# Patient Record
Sex: Female | Born: 1960
Health system: Southern US, Community
[De-identification: ages and names within clinical notes are randomized; demographics above are authoritative.]

## PROBLEM LIST (undated history)

## (undated) DIAGNOSIS — H409 Unspecified glaucoma: Secondary | ICD-10-CM

## (undated) DIAGNOSIS — K3189 Other diseases of stomach and duodenum: Secondary | ICD-10-CM

## (undated) DIAGNOSIS — F32A Depression, unspecified: Secondary | ICD-10-CM

## (undated) DIAGNOSIS — G620 Drug-induced polyneuropathy: Secondary | ICD-10-CM

## (undated) DIAGNOSIS — Z860100 Personal history of colon polyps, unspecified: Secondary | ICD-10-CM

## (undated) DIAGNOSIS — J449 Chronic obstructive pulmonary disease, unspecified: Secondary | ICD-10-CM

## (undated) DIAGNOSIS — R87629 Unspecified abnormal cytological findings in specimens from vagina: Secondary | ICD-10-CM

## (undated) DIAGNOSIS — K219 Gastro-esophageal reflux disease without esophagitis: Secondary | ICD-10-CM

## (undated) DIAGNOSIS — N898 Other specified noninflammatory disorders of vagina: Secondary | ICD-10-CM

## (undated) DIAGNOSIS — G629 Polyneuropathy, unspecified: Secondary | ICD-10-CM

## (undated) DIAGNOSIS — K921 Melena: Secondary | ICD-10-CM

## (undated) DIAGNOSIS — K5730 Diverticulosis of large intestine without perforation or abscess without bleeding: Secondary | ICD-10-CM

## (undated) DIAGNOSIS — Z1509 Genetic susceptibility to other malignant neoplasm: Secondary | ICD-10-CM

## (undated) DIAGNOSIS — R1012 Left upper quadrant pain: Secondary | ICD-10-CM

## (undated) DIAGNOSIS — Z8051 Family history of malignant neoplasm of kidney: Secondary | ICD-10-CM

## (undated) DIAGNOSIS — F329 Major depressive disorder, single episode, unspecified: Secondary | ICD-10-CM

## (undated) DIAGNOSIS — D259 Leiomyoma of uterus, unspecified: Secondary | ICD-10-CM

## (undated) DIAGNOSIS — M1712 Unilateral primary osteoarthritis, left knee: Secondary | ICD-10-CM

## (undated) DIAGNOSIS — K5792 Diverticulitis of intestine, part unspecified, without perforation or abscess without bleeding: Secondary | ICD-10-CM

## (undated) DIAGNOSIS — D649 Anemia, unspecified: Secondary | ICD-10-CM

## (undated) DIAGNOSIS — T451X5A Adverse effect of antineoplastic and immunosuppressive drugs, initial encounter: Secondary | ICD-10-CM

## (undated) DIAGNOSIS — K31A0 Gastric intestinal metaplasia, unspecified: Secondary | ICD-10-CM

## (undated) DIAGNOSIS — K625 Hemorrhage of anus and rectum: Secondary | ICD-10-CM

## (undated) DIAGNOSIS — K2100 Gastro-esophageal reflux disease with esophagitis, without bleeding: Secondary | ICD-10-CM

## (undated) DIAGNOSIS — D490 Neoplasm of unspecified behavior of digestive system: Secondary | ICD-10-CM

## (undated) DIAGNOSIS — R4702 Dysphasia: Secondary | ICD-10-CM

## (undated) DIAGNOSIS — K5900 Constipation, unspecified: Secondary | ICD-10-CM

## (undated) DIAGNOSIS — C541 Malignant neoplasm of endometrium: Secondary | ICD-10-CM

## (undated) DIAGNOSIS — G8929 Other chronic pain: Secondary | ICD-10-CM

## (undated) DIAGNOSIS — R933 Abnormal findings on diagnostic imaging of other parts of digestive tract: Secondary | ICD-10-CM

## (undated) DIAGNOSIS — R1013 Epigastric pain: Secondary | ICD-10-CM

## (undated) DIAGNOSIS — R4701 Aphasia: Secondary | ICD-10-CM

## (undated) DIAGNOSIS — F419 Anxiety disorder, unspecified: Secondary | ICD-10-CM

## (undated) DIAGNOSIS — E785 Hyperlipidemia, unspecified: Secondary | ICD-10-CM

## (undated) DIAGNOSIS — C189 Malignant neoplasm of colon, unspecified: Secondary | ICD-10-CM

## (undated) DIAGNOSIS — M67462 Ganglion, left knee: Secondary | ICD-10-CM

## (undated) DIAGNOSIS — M25469 Effusion, unspecified knee: Secondary | ICD-10-CM

## (undated) DIAGNOSIS — Z9889 Other specified postprocedural states: Secondary | ICD-10-CM

## (undated) DIAGNOSIS — R7303 Prediabetes: Secondary | ICD-10-CM

## (undated) DIAGNOSIS — K30 Functional dyspepsia: Secondary | ICD-10-CM

## (undated) DIAGNOSIS — F449 Dissociative and conversion disorder, unspecified: Secondary | ICD-10-CM

## (undated) DIAGNOSIS — Z809 Family history of malignant neoplasm, unspecified: Secondary | ICD-10-CM

## (undated) DIAGNOSIS — C801 Malignant (primary) neoplasm, unspecified: Secondary | ICD-10-CM

## (undated) DIAGNOSIS — R1319 Other dysphagia: Secondary | ICD-10-CM

## (undated) DIAGNOSIS — IMO0002 Reserved for concepts with insufficient information to code with codable children: Secondary | ICD-10-CM

## (undated) DIAGNOSIS — R112 Nausea with vomiting, unspecified: Secondary | ICD-10-CM

## (undated) HISTORY — PX: ABDOMINAL HYSTERECTOMY: SHX81

## (undated) HISTORY — DX: Family history of malignant neoplasm of kidney: Z80.51

## (undated) HISTORY — PX: APPENDECTOMY: SHX54

## (undated) HISTORY — DX: Functional dyspepsia: K30

## (undated) HISTORY — DX: Chronic obstructive pulmonary disease, unspecified: J44.9

## (undated) HISTORY — DX: Other specified noninflammatory disorders of vagina: N89.8

## (undated) HISTORY — DX: Anemia, unspecified: D64.9

## (undated) HISTORY — DX: Genetic susceptibility to other malignant neoplasm: Z15.09

## (undated) HISTORY — DX: Reserved for concepts with insufficient information to code with codable children: IMO0002

## (undated) HISTORY — DX: Unspecified abnormal cytological findings in specimens from vagina: R87.629

## (undated) HISTORY — DX: Family history of malignant neoplasm, unspecified: Z80.9

## (undated) HISTORY — PX: TUBAL LIGATION: SHX77

## (undated) HISTORY — DX: Malignant neoplasm of colon, unspecified: C18.9

## (undated) HISTORY — DX: Diverticulitis of intestine, part unspecified, without perforation or abscess without bleeding: K57.92

---

## 2004-01-23 ENCOUNTER — Emergency Department (HOSPITAL_COMMUNITY): Admission: EM | Admit: 2004-01-23 | Discharge: 2004-01-23 | Payer: Self-pay | Admitting: Emergency Medicine

## 2004-05-07 ENCOUNTER — Emergency Department (HOSPITAL_COMMUNITY): Admission: EM | Admit: 2004-05-07 | Discharge: 2004-05-07 | Payer: Self-pay | Admitting: Emergency Medicine

## 2004-10-30 ENCOUNTER — Emergency Department (HOSPITAL_COMMUNITY): Admission: EM | Admit: 2004-10-30 | Discharge: 2004-10-30 | Payer: Self-pay | Admitting: Emergency Medicine

## 2004-11-24 ENCOUNTER — Ambulatory Visit (HOSPITAL_COMMUNITY): Admission: RE | Admit: 2004-11-24 | Discharge: 2004-11-24 | Payer: Self-pay | Admitting: Preventative Medicine

## 2005-02-23 ENCOUNTER — Emergency Department (HOSPITAL_COMMUNITY): Admission: EM | Admit: 2005-02-23 | Discharge: 2005-02-23 | Payer: Self-pay | Admitting: Emergency Medicine

## 2005-06-16 ENCOUNTER — Emergency Department (HOSPITAL_COMMUNITY): Admission: EM | Admit: 2005-06-16 | Discharge: 2005-06-16 | Payer: Self-pay | Admitting: Emergency Medicine

## 2006-05-23 ENCOUNTER — Emergency Department (HOSPITAL_COMMUNITY): Admission: EM | Admit: 2006-05-23 | Discharge: 2006-05-23 | Payer: Self-pay | Admitting: Emergency Medicine

## 2007-08-31 ENCOUNTER — Emergency Department (HOSPITAL_COMMUNITY): Admission: EM | Admit: 2007-08-31 | Discharge: 2007-08-31 | Payer: Self-pay | Admitting: Emergency Medicine

## 2007-12-23 ENCOUNTER — Other Ambulatory Visit: Admission: RE | Admit: 2007-12-23 | Discharge: 2007-12-23 | Payer: Self-pay | Admitting: Obstetrics and Gynecology

## 2008-04-10 ENCOUNTER — Emergency Department (HOSPITAL_COMMUNITY): Admission: EM | Admit: 2008-04-10 | Discharge: 2008-04-10 | Payer: Self-pay | Admitting: Emergency Medicine

## 2008-12-04 ENCOUNTER — Emergency Department (HOSPITAL_COMMUNITY): Admission: EM | Admit: 2008-12-04 | Discharge: 2008-12-04 | Payer: Self-pay | Admitting: Emergency Medicine

## 2010-05-06 ENCOUNTER — Emergency Department (HOSPITAL_BASED_OUTPATIENT_CLINIC_OR_DEPARTMENT_OTHER): Admission: EM | Admit: 2010-05-06 | Discharge: 2010-05-06 | Payer: Self-pay | Admitting: Emergency Medicine

## 2010-05-06 ENCOUNTER — Ambulatory Visit: Payer: Self-pay | Admitting: Diagnostic Radiology

## 2011-03-20 ENCOUNTER — Emergency Department (HOSPITAL_BASED_OUTPATIENT_CLINIC_OR_DEPARTMENT_OTHER)
Admission: EM | Admit: 2011-03-20 | Discharge: 2011-03-20 | Disposition: A | Discharge status: Home / Self Care | Payer: Self-pay | Attending: Emergency Medicine | Admitting: Emergency Medicine

## 2011-03-20 DIAGNOSIS — J329 Chronic sinusitis, unspecified: Secondary | ICD-10-CM | POA: Insufficient documentation

## 2011-03-20 DIAGNOSIS — J45909 Unspecified asthma, uncomplicated: Secondary | ICD-10-CM | POA: Insufficient documentation

## 2011-03-20 DIAGNOSIS — J029 Acute pharyngitis, unspecified: Secondary | ICD-10-CM | POA: Insufficient documentation

## 2011-06-19 LAB — URINE MICROSCOPIC-ADD ON

## 2011-06-19 LAB — URINALYSIS, ROUTINE W REFLEX MICROSCOPIC
Glucose, UA: NEGATIVE
Ketones, ur: NEGATIVE
Nitrite: NEGATIVE
Specific Gravity, Urine: 1.007
pH: 6

## 2011-06-29 LAB — URINALYSIS, ROUTINE W REFLEX MICROSCOPIC
Glucose, UA: NEGATIVE
Ketones, ur: NEGATIVE
Leukocytes, UA: NEGATIVE
Protein, ur: NEGATIVE
pH: 7

## 2011-06-29 LAB — DIFFERENTIAL
Basophils Absolute: 0
Eosinophils Relative: 1
Lymphocytes Relative: 32
Neutro Abs: 5.1
Neutrophils Relative %: 58

## 2011-06-29 LAB — POCT I-STAT CREATININE
Creatinine, Ser: 0.8
Creatinine, Ser: 0.8
Operator id: 207241
Operator id: 217071

## 2011-06-29 LAB — I-STAT 8, (EC8 V) (CONVERTED LAB)
BUN: 9
Chloride: 107
Glucose, Bld: 91
HCT: 39
Hemoglobin: 13.3
Operator id: 207241
Sodium: 139

## 2011-06-29 LAB — CBC
Platelets: 274
RDW: 13.1
WBC: 8.8

## 2011-06-29 LAB — URINE MICROSCOPIC-ADD ON

## 2011-06-29 LAB — WET PREP, GENITAL: Yeast Wet Prep HPF POC: NONE SEEN

## 2011-08-25 ENCOUNTER — Emergency Department (HOSPITAL_BASED_OUTPATIENT_CLINIC_OR_DEPARTMENT_OTHER): Payer: Self-pay

## 2011-08-25 ENCOUNTER — Emergency Department (INDEPENDENT_AMBULATORY_CARE_PROVIDER_SITE_OTHER): Payer: Self-pay

## 2011-08-25 ENCOUNTER — Emergency Department (HOSPITAL_BASED_OUTPATIENT_CLINIC_OR_DEPARTMENT_OTHER)
Admission: EM | Admit: 2011-08-25 | Discharge: 2011-08-25 | Disposition: A | Discharge status: Home / Self Care | Payer: Self-pay | Attending: Emergency Medicine | Admitting: Emergency Medicine

## 2011-08-25 ENCOUNTER — Encounter: Payer: Self-pay | Admitting: *Deleted

## 2011-08-25 DIAGNOSIS — B349 Viral infection, unspecified: Secondary | ICD-10-CM

## 2011-08-25 DIAGNOSIS — IMO0001 Reserved for inherently not codable concepts without codable children: Secondary | ICD-10-CM | POA: Insufficient documentation

## 2011-08-25 DIAGNOSIS — R52 Pain, unspecified: Secondary | ICD-10-CM

## 2011-08-25 DIAGNOSIS — R0989 Other specified symptoms and signs involving the circulatory and respiratory systems: Secondary | ICD-10-CM

## 2011-08-25 DIAGNOSIS — R059 Cough, unspecified: Secondary | ICD-10-CM | POA: Insufficient documentation

## 2011-08-25 DIAGNOSIS — B9789 Other viral agents as the cause of diseases classified elsewhere: Secondary | ICD-10-CM | POA: Insufficient documentation

## 2011-08-25 DIAGNOSIS — F17200 Nicotine dependence, unspecified, uncomplicated: Secondary | ICD-10-CM

## 2011-08-25 DIAGNOSIS — R05 Cough: Secondary | ICD-10-CM

## 2011-08-25 MED ORDER — IBUPROFEN 800 MG PO TABS: 800.0000 mg | ORAL_TABLET | Freq: Three times a day (TID) | ORAL | Status: AC

## 2011-08-25 MED ORDER — HYDROCOD POLST-CHLORPHEN POLST 10-8 MG/5ML PO LQCR: 5.0000 mL | Freq: Two times a day (BID) | ORAL | Status: DC

## 2011-08-25 NOTE — ED Provider Notes (Signed)
Medical screening examination/treatment/procedure(s) were performed by non-physician practitioner and as supervising physician I was immediately available for consultation/collaboration.   Lyanne Co, MD 08/25/11 2126

## 2011-08-25 NOTE — ED Provider Notes (Signed)
History     CSN: [id number] Arrival date & time: 08/25/2011  1:09 PM   First MD Initiated Contact with Patient 08/25/11 1323      Chief Complaint  Patient presents with  . Cough  . Generalized Body Aches    (Consider location/radiation/quality/duration/timing/severity/associated sxs/prior treatment) Patient is a 50 y.o. female presenting with cough. The history is provided by the patient. No language interpreter was used.  Cough This is a new problem. The current episode started more than 2 days ago. The problem occurs constantly. The problem has been gradually worsening. The cough is non-productive. The maximum temperature recorded prior to her arrival was 101 to 101.9 F. Associated symptoms include myalgias and shortness of breath. Pertinent negatives include no chest pain. She has tried cough syrup for the symptoms. The treatment provided no relief. Risk factors: smoker. She is a smoker. Her past medical history does not include pneumonia.    Past Medical History  Diagnosis Date  . Asthma     Past Surgical History  Procedure Date  . Tubal ligation   . Appendectomy     History reviewed. No pertinent family history.  History  Substance Use Topics  . Smoking status: Current Everyday Smoker  . Smokeless tobacco: Not on file  . Alcohol Use:     OB History    Grav Para Term Preterm Abortions TAB SAB Ect Mult Living                  Review of Systems  Respiratory: Positive for cough and shortness of breath.   Cardiovascular: Negative for chest pain.  Musculoskeletal: Positive for myalgias.  All other systems reviewed and are negative.    Allergies  Codeine  Home Medications  No current outpatient prescriptions on file.  BP 137/72  Pulse 74  Temp 97.9 F (36.6 C)  SpO2 97%  Physical Exam  Nursing note and vitals reviewed. Constitutional: She is oriented to person, place, and time. She appears well-developed and well-nourished.  HENT:  Head:  Normocephalic and atraumatic.  Right Ear: External ear normal.  Left Ear: External ear normal.  Nose: Nose normal.  Mouth/Throat: Oropharynx is clear and moist.  Eyes: Conjunctivae and EOM are normal. Pupils are equal, round, and reactive to light.  Neck: Normal range of motion. Neck supple.  Cardiovascular: Normal rate and normal heart sounds.   Pulmonary/Chest: Effort normal and breath sounds normal.  Abdominal: Soft.  Musculoskeletal: Normal range of motion.  Neurological: She is alert and oriented to person, place, and time. She has normal reflexes.  Skin: Skin is warm.  Psychiatric: She has a normal mood and affect.    ED Course  Procedures (including critical care time)  Labs Reviewed - No data to display Dg Chest 2 View  08/25/2011  *RADIOLOGY REPORT*  Clinical Data: Cough, congestion, body aches, smoker  CHEST - 2 VIEW  Comparison: December 04, 2008  Findings: The cardiac silhouette, mediastinum, pulmonary vasculature are within normal limits.  Both lungs are clear. There is no acute bony abnormality.  IMPRESSION: There is no evidence of acute cardiac or pulmonary process.  Original Report Authenticated By: Brandon Melnick, M.D.     No diagnosis found.    MDM  Chest xray no pneumonia.  Pt given rx for tussionex for cough,  Ibuprofen for bodyaches        Langston Masker, [place] 08/25/11 1421

## 2011-08-25 NOTE — ED Notes (Signed)
Coughing congestion noted

## 2011-08-25 NOTE — ED Notes (Signed)
Pt c/o cough, body aches and chest congestion x 3 days

## 2012-08-09 ENCOUNTER — Encounter (HOSPITAL_BASED_OUTPATIENT_CLINIC_OR_DEPARTMENT_OTHER): Payer: Self-pay | Admitting: Emergency Medicine

## 2012-08-09 ENCOUNTER — Emergency Department (HOSPITAL_BASED_OUTPATIENT_CLINIC_OR_DEPARTMENT_OTHER)
Admission: EM | Admit: 2012-08-09 | Discharge: 2012-08-09 | Disposition: A | Discharge status: Home / Self Care | Payer: Self-pay | Attending: Emergency Medicine | Admitting: Emergency Medicine

## 2012-08-09 ENCOUNTER — Emergency Department (HOSPITAL_BASED_OUTPATIENT_CLINIC_OR_DEPARTMENT_OTHER): Payer: Self-pay

## 2012-08-09 DIAGNOSIS — R05 Cough: Secondary | ICD-10-CM | POA: Insufficient documentation

## 2012-08-09 DIAGNOSIS — R0602 Shortness of breath: Secondary | ICD-10-CM | POA: Insufficient documentation

## 2012-08-09 DIAGNOSIS — J329 Chronic sinusitis, unspecified: Secondary | ICD-10-CM

## 2012-08-09 DIAGNOSIS — F17200 Nicotine dependence, unspecified, uncomplicated: Secondary | ICD-10-CM | POA: Insufficient documentation

## 2012-08-09 DIAGNOSIS — Z79899 Other long term (current) drug therapy: Secondary | ICD-10-CM | POA: Insufficient documentation

## 2012-08-09 DIAGNOSIS — R6883 Chills (without fever): Secondary | ICD-10-CM | POA: Insufficient documentation

## 2012-08-09 DIAGNOSIS — J069 Acute upper respiratory infection, unspecified: Secondary | ICD-10-CM | POA: Insufficient documentation

## 2012-08-09 DIAGNOSIS — J019 Acute sinusitis, unspecified: Secondary | ICD-10-CM | POA: Insufficient documentation

## 2012-08-09 DIAGNOSIS — J3489 Other specified disorders of nose and nasal sinuses: Secondary | ICD-10-CM | POA: Insufficient documentation

## 2012-08-09 DIAGNOSIS — R059 Cough, unspecified: Secondary | ICD-10-CM | POA: Insufficient documentation

## 2012-08-09 DIAGNOSIS — J45909 Unspecified asthma, uncomplicated: Secondary | ICD-10-CM | POA: Insufficient documentation

## 2012-08-09 DIAGNOSIS — IMO0001 Reserved for inherently not codable concepts without codable children: Secondary | ICD-10-CM | POA: Insufficient documentation

## 2012-08-09 MED ORDER — ALBUTEROL SULFATE HFA 108 (90 BASE) MCG/ACT IN AERS
2.0000 | INHALATION_SPRAY | RESPIRATORY_TRACT | Status: DC | PRN
  Administered 2012-08-09: 2 via RESPIRATORY_TRACT
  Filled 2012-08-09: qty 6.7

## 2012-08-09 MED ORDER — AZITHROMYCIN 250 MG PO TABS: 250.0000 mg | ORAL_TABLET | Freq: Every day | ORAL | Status: DC

## 2012-08-09 MED ORDER — AZITHROMYCIN 250 MG PO TABS
500.0000 mg | ORAL_TABLET | Freq: Once | ORAL | Status: AC
  Administered 2012-08-09: 500 mg via ORAL
  Filled 2012-08-09: qty 2

## 2012-08-09 NOTE — ED Notes (Signed)
MD at bedside. 

## 2012-08-09 NOTE — ED Notes (Signed)
Pt c/o "sinus infection" that has now moved to lower respiratory. Pt reports new onset on n/v, chills, generalized body aches and SHOB.

## 2012-08-09 NOTE — ED Provider Notes (Signed)
History     CSN: [id number]  Arrival date & time 08/09/12  2956   First MD Initiated Contact with Patient 08/09/12 [phone]      Chief Complaint  Patient presents with  . Emesis  . Generalized Body Aches  . Cough    (Consider location/radiation/quality/duration/timing/severity/associated sxs/prior treatment) Patient is a 51 y.o. female presenting with vomiting and cough. The history is provided by the patient.  Emesis  This is a new problem. The current episode started less than 1 hour ago. Episode frequency: once. The problem has been resolved. The emesis has an appearance of stomach contents. There has been no fever (chills). Associated symptoms include chills, cough, myalgias and URI. Pertinent negatives include no abdominal pain, no diarrhea and no fever. Risk factors include ill contacts.  Cough This is a new problem. The current episode started more than 1 week ago. The problem occurs every few minutes. The problem has been gradually worsening. The cough is productive of sputum. There has been no fever. Associated symptoms include chills, rhinorrhea, myalgias, shortness of breath and wheezing. Pertinent negatives include no sore throat. Associated symptoms comments: Worsening sinus congestion and facial pain. She has tried decongestants for the symptoms. The treatment provided no relief. She is a smoker. Her past medical history is significant for asthma. Her past medical history does not include COPD.    Past Medical History  Diagnosis Date  . Asthma     Past Surgical History  Procedure Date  . Tubal ligation   . Appendectomy     No family history on file.  History  Substance Use Topics  . Smoking status: Current Every Day Smoker -- 0.5 packs/day    Types: Cigarettes  . Smokeless tobacco: Not on file  . Alcohol Use: No    OB History    Grav Para Term Preterm Abortions TAB SAB Ect Mult Living                  Review of Systems  Constitutional: Positive for  chills. Negative for fever.  HENT: Positive for congestion, rhinorrhea and sinus pressure. Negative for sore throat.   Respiratory: Positive for cough, shortness of breath and wheezing.   Gastrointestinal: Positive for vomiting. Negative for abdominal pain and diarrhea.  Musculoskeletal: Positive for myalgias.  All other systems reviewed and are negative.    Allergies  Codeine  Home Medications   Current Outpatient Rx  Name  Route  Sig  Dispense  Refill  . HYDROCOD POLST-CPM POLST ER 10-8 MG/5ML PO LQCR   Oral   Take 5 mLs by mouth every 12 (twelve) hours.   80 mL   0     BP 123/77  Pulse 79  Temp 98.1 F (36.7 C) (Oral)  Resp 16  Ht 5\' 3"  (1.6 m)  Wt 160 lb (86.578 kg)  BMI 28.34 kg/m2  SpO2 100%  Physical Exam  Nursing note and vitals reviewed. Constitutional: She is oriented to person, place, and time. She appears well-developed and well-nourished. No distress.  HENT:  Head: Normocephalic and atraumatic.  Right Ear: Tympanic membrane and ear canal normal.  Left Ear: Tympanic membrane and ear canal normal.  Nose: Mucosal edema present. Right sinus exhibits maxillary sinus tenderness. Left sinus exhibits maxillary sinus tenderness.  Mouth/Throat: Oropharynx is clear and moist and mucous membranes are normal.  Eyes: Conjunctivae normal and EOM are normal. Pupils are equal, round, and reactive to light.  Neck: Normal range of motion. Neck supple.  Cardiovascular: Normal  rate, regular rhythm and intact distal pulses.   No murmur heard. Pulmonary/Chest: Effort normal and breath sounds normal. No respiratory distress. She has no wheezes. She has no rales.  Abdominal: Soft. She exhibits no distension. There is no tenderness. There is no rebound and no guarding.  Musculoskeletal: Normal range of motion. She exhibits no edema and no tenderness.  Neurological: She is alert and oriented to person, place, and time.  Skin: Skin is warm and dry. No rash noted. No erythema.    Psychiatric: She has a normal mood and affect. Her behavior is normal.    ED Course  Procedures (including critical care time)  Labs Reviewed - No data to display No results found.   No diagnosis found.    MDM   Pt with symptoms consistent with sinusitis which as been ongoing for >2 weeks and now c/o of symptoms suggestive of bronchitis.  Well appearing here.  No signs of breathing difficulty and no wheezing.  However pt is a smoker and inhaler ran out 3 days ago.  No signs of pharyngitis, otitis or abnormal abdominal findings.   CXR wnl and pt to return with any further problems. Pt given inhaler here and due to 2 weeks of sinus sx will treat with abx.         Gwyneth Sprout, MD 08/09/12 1444

## 2012-08-09 NOTE — ED Notes (Signed)
Chart reviewed.

## 2012-11-19 ENCOUNTER — Emergency Department (HOSPITAL_BASED_OUTPATIENT_CLINIC_OR_DEPARTMENT_OTHER)
Admission: EM | Admit: 2012-11-19 | Discharge: 2012-11-19 | Disposition: A | Discharge status: Home / Self Care | Payer: Self-pay | Attending: Emergency Medicine | Admitting: Emergency Medicine

## 2012-11-19 ENCOUNTER — Encounter (HOSPITAL_BASED_OUTPATIENT_CLINIC_OR_DEPARTMENT_OTHER): Payer: Self-pay | Admitting: Emergency Medicine

## 2012-11-19 DIAGNOSIS — Z79899 Other long term (current) drug therapy: Secondary | ICD-10-CM | POA: Insufficient documentation

## 2012-11-19 DIAGNOSIS — F17200 Nicotine dependence, unspecified, uncomplicated: Secondary | ICD-10-CM | POA: Insufficient documentation

## 2012-11-19 DIAGNOSIS — R05 Cough: Secondary | ICD-10-CM | POA: Insufficient documentation

## 2012-11-19 DIAGNOSIS — J329 Chronic sinusitis, unspecified: Secondary | ICD-10-CM | POA: Insufficient documentation

## 2012-11-19 DIAGNOSIS — J441 Chronic obstructive pulmonary disease with (acute) exacerbation: Secondary | ICD-10-CM | POA: Insufficient documentation

## 2012-11-19 DIAGNOSIS — J3489 Other specified disorders of nose and nasal sinuses: Secondary | ICD-10-CM | POA: Insufficient documentation

## 2012-11-19 DIAGNOSIS — R059 Cough, unspecified: Secondary | ICD-10-CM | POA: Insufficient documentation

## 2012-11-19 DIAGNOSIS — J45909 Unspecified asthma, uncomplicated: Secondary | ICD-10-CM | POA: Insufficient documentation

## 2012-11-19 DIAGNOSIS — R062 Wheezing: Secondary | ICD-10-CM | POA: Insufficient documentation

## 2012-11-19 DIAGNOSIS — Z8709 Personal history of other diseases of the respiratory system: Secondary | ICD-10-CM | POA: Insufficient documentation

## 2012-11-19 MED ORDER — ALBUTEROL SULFATE HFA 108 (90 BASE) MCG/ACT IN AERS: 2.0000 | INHALATION_SPRAY | RESPIRATORY_TRACT | Status: DC | PRN

## 2012-11-19 MED ORDER — AMOXICILLIN 500 MG PO TABS: 1000.0000 mg | ORAL_TABLET | Freq: Three times a day (TID) | ORAL | Status: DC

## 2012-11-19 MED ORDER — PREDNISONE 20 MG PO TABS: ORAL_TABLET | ORAL | Status: DC

## 2012-11-19 NOTE — ED Provider Notes (Signed)
History     CSN: [id number]  Arrival date & time 11/19/12  1200   First MD Initiated Contact with Patient 11/19/12 1229      Chief Complaint  Patient presents with  . Shortness of Breath    (Consider location/radiation/quality/duration/timing/severity/associated sxs/prior treatment) HPI This 52 year old female has a history of asthma, COPD, chronic bronchitis, chronic smokers cough with productive sputum, chronic sinus congestion, recurrent sinusitis, complains of several weeks of worsening nasal congestion with several days of wheezing only partially improved with her inhaler, her last steroids were many months ago if not years ago, she still smokes and was told to discontinue, she is no fever, no severe headache, no confusion, no rash, no chest pain, no abdominal pain, no vomiting, no diarrhea, no body aches, treatment prior to arrival consists of albuterol inhaler with partial improvement, she is mild shortness of breath worse with activity over the last several days of worsening coughing over the last several days with a change in her sputum from clear to yellowish brown. Past Medical History  Diagnosis Date  . Asthma     Past Surgical History  Procedure Laterality Date  . Tubal ligation    . Appendectomy      No family history on file.  History  Substance Use Topics  . Smoking status: Current Every Day Smoker -- 0.50 packs/day    Types: Cigarettes  . Smokeless tobacco: Not on file  . Alcohol Use: No    OB History   Grav Para Term Preterm Abortions TAB SAB Ect Mult Living                  Review of Systems 10 Systems reviewed and are negative for acute change except as noted in the HPI. Allergies  Codeine  Home Medications   Current Outpatient Rx  Name  Route  Sig  Dispense  Refill  . albuterol (PROVENTIL HFA;VENTOLIN HFA) 108 (90 BASE) MCG/ACT inhaler   Inhalation   Inhale 2 puffs into the lungs every 2 (two) hours as needed for wheezing or shortness of  breath (cough).   1 Inhaler   0   . ALBUTEROL IN   Inhalation   Inhale into the lungs.         Marland Kitchen amoxicillin (AMOXIL) 500 MG tablet   Oral   Take 2 tablets (1,000 mg total) by mouth 3 (three) times daily.   42 tablet   0   . predniSONE (DELTASONE) 20 MG tablet      3 tabs po day one, then 2 po daily x 4 days   11 tablet   0     BP 127/78  Pulse 94  Temp(Src) 98.1 F (36.7 C) (Oral)  Resp 24  Ht 5\' 4"  (1.626 m)  Wt 160 lb (72.576 kg)  BMI 27.45 kg/m2  SpO2 100%  Physical Exam  Nursing note and vitals reviewed. Constitutional:  Awake, alert, nontoxic appearance.  HENT:  Head: Atraumatic.  Mouth/Throat: Oropharynx is clear and moist. No oropharyngeal exudate.  Nares are clear bilaterally  Eyes: Right eye exhibits no discharge. Left eye exhibits no discharge.  Neck: Neck supple.  Cardiovascular: Normal rate and regular rhythm.   No murmur heard. Pulmonary/Chest: Effort normal. No respiratory distress. She has wheezes. She has no rales. She exhibits no tenderness.  Wheezes only heard with forced exhalation mild end expiratory wheezes bilaterally  Abdominal: Soft. There is no tenderness. There is no rebound.  Musculoskeletal: She exhibits no edema and no tenderness.  Baseline ROM, no obvious new focal weakness.  Neurological: She is alert.  Mental status and motor strength appears baseline for patient and situation.  Skin: No rash noted.  Psychiatric: She has a normal mood and affect.    ED Course  Procedures (including critical care time)  Labs Reviewed - No data to display No results found.   1. COPD exacerbation   2. Sinusitis       MDM   Pt stable in ED with no significant deterioration in condition.  Patient / Family / Caregiver informed of clinical course, understand medical decision-making process, and agree with plan. I doubt any other EMC precluding discharge at this time including, but not necessarily limited to the  following:sepsis.      Hurman Horn, MD 11/19/12 2151

## 2012-11-19 NOTE — ED Notes (Signed)
Pt having some difficulty taking deep breath.  Pt states her inhaler is not working anymore.

## 2014-02-15 ENCOUNTER — Emergency Department (HOSPITAL_BASED_OUTPATIENT_CLINIC_OR_DEPARTMENT_OTHER): Payer: Self-pay

## 2014-02-15 ENCOUNTER — Emergency Department (HOSPITAL_BASED_OUTPATIENT_CLINIC_OR_DEPARTMENT_OTHER)
Admission: EM | Admit: 2014-02-15 | Discharge: 2014-02-15 | Disposition: A | Discharge status: Home / Self Care | Payer: Self-pay | Attending: Emergency Medicine | Admitting: Emergency Medicine

## 2014-02-15 ENCOUNTER — Encounter (HOSPITAL_BASED_OUTPATIENT_CLINIC_OR_DEPARTMENT_OTHER): Payer: Self-pay | Admitting: Emergency Medicine

## 2014-02-15 DIAGNOSIS — R109 Unspecified abdominal pain: Secondary | ICD-10-CM | POA: Insufficient documentation

## 2014-02-15 DIAGNOSIS — Z792 Long term (current) use of antibiotics: Secondary | ICD-10-CM | POA: Insufficient documentation

## 2014-02-15 DIAGNOSIS — F17200 Nicotine dependence, unspecified, uncomplicated: Secondary | ICD-10-CM | POA: Insufficient documentation

## 2014-02-15 DIAGNOSIS — N939 Abnormal uterine and vaginal bleeding, unspecified: Secondary | ICD-10-CM

## 2014-02-15 DIAGNOSIS — IMO0002 Reserved for concepts with insufficient information to code with codable children: Secondary | ICD-10-CM | POA: Insufficient documentation

## 2014-02-15 DIAGNOSIS — J45909 Unspecified asthma, uncomplicated: Secondary | ICD-10-CM | POA: Insufficient documentation

## 2014-02-15 DIAGNOSIS — N898 Other specified noninflammatory disorders of vagina: Secondary | ICD-10-CM | POA: Insufficient documentation

## 2014-02-15 DIAGNOSIS — R110 Nausea: Secondary | ICD-10-CM | POA: Insufficient documentation

## 2014-02-15 DIAGNOSIS — D259 Leiomyoma of uterus, unspecified: Secondary | ICD-10-CM | POA: Insufficient documentation

## 2014-02-15 HISTORY — DX: Leiomyoma of uterus, unspecified: D25.9

## 2014-02-15 LAB — BASIC METABOLIC PANEL
BUN: 13 mg/dL (ref 6–23)
CHLORIDE: 102 meq/L (ref 96–112)
CO2: 25 meq/L (ref 19–32)
CREATININE: 0.8 mg/dL (ref 0.50–1.10)
Calcium: 9.6 mg/dL (ref 8.4–10.5)
GFR calc Af Amer: >90 mL/min (ref >90)
GFR calc non Af Amer: 85 mL/min — ABNORMAL LOW (ref >90)
GLUCOSE: 101 mg/dL — AB (ref 70–99)
POTASSIUM: 3.7 meq/L (ref 3.7–5.3)
Sodium: 142 mEq/L (ref 137–147)

## 2014-02-15 LAB — CBC WITH DIFFERENTIAL/PLATELET
BASOS ABS: 0 10*3/uL (ref 0.0–0.1)
BASOS PCT: 1 % (ref 0–1)
EOS ABS: 0.1 10*3/uL (ref 0.0–0.7)
Eosinophils Relative: 1 % (ref 0–5)
HEMATOCRIT: 35.5 % — AB (ref 36.0–46.0)
Hemoglobin: 12.2 g/dL (ref 12.0–15.0)
Lymphocytes Relative: 41 % (ref 12–46)
Lymphs Abs: 3.5 10*3/uL (ref 0.7–4.0)
MCH: 31.4 pg (ref 26.0–34.0)
MCHC: 34.4 g/dL (ref 30.0–36.0)
MCV: 91.5 fL (ref 78.0–100.0)
MONO ABS: 0.6 10*3/uL (ref 0.1–1.0)
MONOS PCT: 7 % (ref 3–12)
NEUTROS ABS: 4.2 10*3/uL (ref 1.7–7.7)
Neutrophils Relative %: 50 % (ref 43–77)
Platelets: 276 10*3/uL (ref 150–400)
RBC: 3.88 MIL/uL (ref 3.87–5.11)
RDW: 13 % (ref 11.5–15.5)
WBC: 8.4 10*3/uL (ref 4.0–10.5)

## 2014-02-15 LAB — WET PREP, GENITAL
CLUE CELLS WET PREP: NONE SEEN
Trich, Wet Prep: NONE SEEN
WBC, Wet Prep HPF POC: NONE SEEN
Yeast Wet Prep HPF POC: NONE SEEN

## 2014-02-15 MED ORDER — TRAMADOL HCL 50 MG PO TABS
50.0000 mg | ORAL_TABLET | Freq: Four times a day (QID) | ORAL | Status: DC | PRN
Start: 2014-02-15 — End: 2014-09-16

## 2014-02-15 NOTE — ED Notes (Signed)
Pt reports menopause approx age 53-no having vaginal bleeding x 1 month daily-worse with clots x 1 week

## 2014-02-15 NOTE — Discharge Instructions (Signed)
Stay well-hydrated and followup closely with gynecology. Return if bleeding worsens or you pass out.  If you were given medicines take as directed.  If you are on coumadin or contraceptives realize their levels and effectiveness is altered by many different medicines.  If you have any reaction (rash, tongues swelling, other) to the medicines stop taking and see a physician.   Please follow up as directed and return to the ER or see a physician for new or worsening symptoms.  Thank you. Filed Vitals:   02/15/14 1143  BP: 122/81  Pulse: 83  Temp: 98.2 F (36.8 C)  TempSrc: Oral  Resp: 20  Height: 5\' 4"  (1.626 m)  Weight: 150 lb (68.04 kg)  SpO2: 100%    Fibroids Fibroids are lumps (tumors) that can occur any place in a woman's body. These lumps are not cancerous. Fibroids vary in size, weight, and where they grow. HOME CARE  Do not take aspirin.  Write down the number of pads or tampons you use during your period. Tell your doctor. This can help determine the best treatment for you. GET HELP RIGHT AWAY IF:  You have pain in your lower belly (abdomen) that is not helped with medicine.  You have cramps that are not helped with medicine.  You have more bleeding between or during your period.  You feel lightheaded or pass out (faint).  Your lower belly pain gets worse. MAKE SURE YOU:  Understand these instructions.  Will watch your condition.  Will get help right away if you are not doing well or get worse. Document Released: 10/10/2010 Document Revised: 11/30/2011 Document Reviewed: 10/10/2010 [facility] Patient Information 2014 [place], [place].

## 2014-02-15 NOTE — ED Provider Notes (Signed)
CSN: [id number]     Arrival date & time 02/15/14  1132 History   First MD Initiated Contact with Patient 02/15/14 1144     Chief Complaint  Patient presents with  . Vaginal Bleeding     (Consider location/radiation/quality/duration/timing/severity/associated sxs/prior Treatment) HPI Comments: 53 year old female with smoking and alcohol history, asthma presents with recurrent vaginal bleeding for the past month with suprapubic pain and blood clots. Bleeding is gradually worsened multiple times a day and she soaked her sheets a couple times. Patient has mild lightheadedness. Patient has never had bleeding is significant. Patient had early menopause and this is a new problem. Patient has not seen OB/GYN and she has no insurance. Patient has had weight loss the past few months. No known cervical cancer. Patient has fibroids. No blood thinners. Patient has a new sexual partner but no vaginal discharge.  Patient is a 53 y.o. female presenting with vaginal bleeding. The history is provided by the patient.  Vaginal Bleeding Associated symptoms: abdominal pain and nausea   Associated symptoms: no back pain, no dysuria and no fever     Past Medical History  Diagnosis Date  . Asthma   . Uterine fibroid    Past Surgical History  Procedure Laterality Date  . Tubal ligation    . Appendectomy     No family history on file. History  Substance Use Topics  . Smoking status: Current Every Day Smoker -- 0.50 packs/day    Types: Cigarettes  . Smokeless tobacco: Not on file  . Alcohol Use: Yes   OB History   Grav Para Term Preterm Abortions TAB SAB Ect Mult Living                 Review of Systems  Constitutional: Positive for unexpected weight change. Negative for fever and chills.  Cardiovascular: Negative for chest pain.  Gastrointestinal: Positive for nausea and abdominal pain. Negative for vomiting.  Genitourinary: Positive for vaginal bleeding. Negative for dysuria and flank pain.   Musculoskeletal: Negative for back pain, neck pain and neck stiffness.  Skin: Negative for rash.  Neurological: Positive for light-headedness. Negative for headaches.      Allergies  Codeine  Home Medications   Prior to Admission medications   Medication Sig Start Date End Date Taking? Authorizing Provider  ibuprofen (ADVIL,MOTRIN) 200 MG tablet Take 200 mg by mouth every 6 (six) hours as needed.   Yes Historical Provider, MD  albuterol (PROVENTIL HFA;VENTOLIN HFA) 108 (90 BASE) MCG/ACT inhaler Inhale 2 puffs into the lungs every 2 (two) hours as needed for wheezing or shortness of breath (cough). 11/19/12   Babette Relic, MD  ALBUTEROL IN Inhale into the lungs.    Historical Provider, MD  amoxicillin (AMOXIL) 500 MG tablet Take 2 tablets (1,000 mg total) by mouth 3 (three) times daily. 11/19/12   Babette Relic, MD  predniSONE (DELTASONE) 20 MG tablet 3 tabs po day one, then 2 po daily x 4 days 11/19/12   Babette Relic, MD   BP 122/81  Pulse 83  Temp(Src) 98.2 F (36.8 C) (Oral)  Resp 20  Ht 5\' 4"  (1.626 m)  Wt 150 lb (68.04 kg)  BMI 25.73 kg/m2  SpO2 100% Physical Exam  Nursing note and vitals reviewed. Constitutional: She is oriented to person, place, and time. She appears well-developed and well-nourished.  HENT:  Head: Normocephalic and atraumatic.  Eyes: Conjunctivae are normal. Right eye exhibits no discharge. Left eye exhibits no discharge.  Neck: Normal range  of motion. Neck supple. No tracheal deviation present.  Cardiovascular: Normal rate and regular rhythm.   Pulmonary/Chest: Effort normal and breath sounds normal.  Abdominal: Soft. She exhibits no distension. There is tenderness (mild suprapubic). There is no guarding.  Genitourinary:  Mild vaginal bleeding with few clots. No cervical motion tenderness however nodule and mild firmness palpated concern for either fibroid versus cancer.  Musculoskeletal: She exhibits no edema.  Neurological: She is alert and oriented  to person, place, and time.  Skin: Skin is warm. No rash noted.  Psychiatric: She has a normal mood and affect.    ED Course  Procedures (including critical care time) Labs Review Labs Reviewed  CBC WITH DIFFERENTIAL - Abnormal; Notable for the following:    HCT 35.5 (*)    All other components within normal limits  BASIC METABOLIC PANEL - Abnormal; Notable for the following:    Glucose, Bld 101 (*)    GFR calc non Af Amer 85 (*)    All other components within normal limits  WET PREP, GENITAL  GC/CHLAMYDIA PROBE AMP    Imaging Review US Transvaginal Non-ob  02/15/2014   CLINICAL DATA:  53 year old female with intermittent vaginal bleeding status post menopause 5 years ago. Initial encounter. History of tubal ligation.  EXAM: TRANSABDOMINAL AND TRANSVAGINAL ULTRASOUND OF PELVIS  TECHNIQUE: Both transabdominal and transvaginal ultrasound examinations of the pelvis were performed. Transabdominal technique was performed for global imaging of the pelvis including uterus, ovaries, adnexal regions, and pelvic cul-de-sac. It was necessary to proceed with endovaginal exam following the transabdominal exam to visualize the endometrium.  COMPARISON:  CT Abdomen and Pelvis 08/31/2007.  FINDINGS: Uterus  Measurements: 7.6 x 5.7 x 6.3 cm. Mildly lobulated contour with multiple nearly isoechoic round masses (each D images 46 and 47) measuring up to 4.3 cm diameter compatible with fibroids.  Endometrium  Thickness: 2 mm, diminutive. Heterogeneous hyper echogenicity associated with the endometrium (image 53), no masslike area identified.  Right ovary  Measurements: Could not be visualized due to overlying bowel gas. Normal appearance/no adnexal mass.  Left ovary  Measurements: 2.9 x 2.7 x 2.5 cm. Normal appearance/no adnexal mass.  Other findings  No free fluid.  IMPRESSION: 1. Fibroid uterus. 2. Diminutive endometrium, mildly heterogeneous, but with no suspicious or masslike areas. 3. Right ovary not  identified due to overlying bowel gas.   Electronically Signed   By: Lars Pinks M.D.   On: 02/15/2014 12:59   US Pelvis Complete  02/15/2014   CLINICAL DATA:  53 year old female with intermittent vaginal bleeding status post menopause 5 years ago. Initial encounter. History of tubal ligation.  EXAM: TRANSABDOMINAL AND TRANSVAGINAL ULTRASOUND OF PELVIS  TECHNIQUE: Both transabdominal and transvaginal ultrasound examinations of the pelvis were performed. Transabdominal technique was performed for global imaging of the pelvis including uterus, ovaries, adnexal regions, and pelvic cul-de-sac. It was necessary to proceed with endovaginal exam following the transabdominal exam to visualize the endometrium.  COMPARISON:  CT Abdomen and Pelvis 08/31/2007.  FINDINGS: Uterus  Measurements: 7.6 x 5.7 x 6.3 cm. Mildly lobulated contour with multiple nearly isoechoic round masses (each D images 46 and 47) measuring up to 4.3 cm diameter compatible with fibroids.  Endometrium  Thickness: 2 mm, diminutive. Heterogeneous hyper echogenicity associated with the endometrium (image 53), no masslike area identified.  Right ovary  Measurements: Could not be visualized due to overlying bowel gas. Normal appearance/no adnexal mass.  Left ovary  Measurements: 2.9 x 2.7 x 2.5 cm. Normal appearance/no adnexal  mass.  Other findings  No free fluid.  IMPRESSION: 1. Fibroid uterus. 2. Diminutive endometrium, mildly heterogeneous, but with no suspicious or masslike areas. 3. Right ovary not identified due to overlying bowel gas.   Electronically Signed   By: Lars Pinks M.D.   On: 02/15/2014 12:59     EKG Interpretation None      MDM   Final diagnoses:  Uterine fibroid  Vaginal bleeding   Discuss concern for possible cancer versus fibroids with presentation and age. Discussed importance of very close followup with OB/GYN physician. Plan for blood work and ultrasound pelvic.  Hemoglobin okay and ultrasound showed fibroids  consistent with bleeding. Discussed close followup for possible underlying malignancy with gynecology. Results and differential diagnosis were discussed with the patient/parent/guardian. Close follow up outpatient was discussed, comfortable with the plan.   Filed Vitals:   02/15/14 1143  BP: 122/81  Pulse: 83  Temp: 98.2 F (36.8 C)  TempSrc: Oral  Resp: 20  Height: 5\' 4"  (1.626 m)  Weight: 150 lb (68.04 kg)  SpO2: 100%      And a and I of her head and he is is a small in the and: Place of the field and a no is a fall y is seen in he had this is a thank youes her hemoglobin was is soft  Mariea Clonts, MD 02/15/14 1318

## 2014-02-16 LAB — GC/CHLAMYDIA PROBE AMP
CT Probe RNA: NEGATIVE
GC Probe RNA: NEGATIVE

## 2014-09-10 ENCOUNTER — Encounter (HOSPITAL_COMMUNITY): Payer: Self-pay

## 2014-09-10 ENCOUNTER — Emergency Department (HOSPITAL_COMMUNITY)
Admission: EM | Admit: 2014-09-10 | Discharge: 2014-09-10 | Discharge status: Against Medical Advice | Payer: Medicaid Other | Source: Home / Self Care

## 2014-09-10 DIAGNOSIS — R200 Anesthesia of skin: Secondary | ICD-10-CM

## 2014-09-10 DIAGNOSIS — J45909 Unspecified asthma, uncomplicated: Secondary | ICD-10-CM

## 2014-09-10 DIAGNOSIS — Z720 Tobacco use: Secondary | ICD-10-CM

## 2014-09-10 DIAGNOSIS — R202 Paresthesia of skin: Secondary | ICD-10-CM

## 2014-09-10 NOTE — ED Notes (Signed)
Pt was waiting in triage room as this nurse was cleaning a room for patient in the treatment area.  Upon return of this nurse to triage room to get pt to take her back to treatment area, the niece abruptly wheeled pt out of triage room and out of triage area stating "I'm just going to take her to [facility] where they know her best since you are taking so long to have her seen by a doctor"   It was literally less than 10 minutes that it took to get a room ready for the patient but the niece decided to take her out and refused to stop to sign her out of the E.R.

## 2014-09-10 NOTE — ED Notes (Addendum)
Pt here with niece (medical power of attorney)   Pt states she is having trouble speaking and putting her thoughts together for the past 5 days.  Pt states symptoms are intermittent but got worse tonight.  Pt states she is having numbness and tingling on both sides of her face, denies weakness.  Pt intermittently writing notes on a tablet and then speaking to answer questions.

## 2014-09-11 ENCOUNTER — Emergency Department (HOSPITAL_COMMUNITY): Payer: Medicaid Other

## 2014-09-11 ENCOUNTER — Inpatient Hospital Stay (HOSPITAL_COMMUNITY): Payer: Medicaid Other

## 2014-09-11 ENCOUNTER — Encounter (HOSPITAL_COMMUNITY): Payer: Self-pay | Admitting: Emergency Medicine

## 2014-09-11 ENCOUNTER — Inpatient Hospital Stay (HOSPITAL_COMMUNITY)
Admission: EM | Admit: 2014-09-11 | Discharge: 2014-09-11 | DRG: 093 | Disposition: A | Discharge status: Home / Self Care | Payer: Medicaid Other | Attending: Internal Medicine | Admitting: Internal Medicine

## 2014-09-11 DIAGNOSIS — C541 Malignant neoplasm of endometrium: Secondary | ICD-10-CM | POA: Diagnosis present

## 2014-09-11 DIAGNOSIS — J45909 Unspecified asthma, uncomplicated: Secondary | ICD-10-CM | POA: Diagnosis present

## 2014-09-11 DIAGNOSIS — F449 Dissociative and conversion disorder, unspecified: Secondary | ICD-10-CM

## 2014-09-11 DIAGNOSIS — Z79899 Other long term (current) drug therapy: Secondary | ICD-10-CM

## 2014-09-11 DIAGNOSIS — R404 Transient alteration of awareness: Secondary | ICD-10-CM

## 2014-09-11 DIAGNOSIS — Z90710 Acquired absence of both cervix and uterus: Secondary | ICD-10-CM

## 2014-09-11 DIAGNOSIS — R4701 Aphasia: Secondary | ICD-10-CM

## 2014-09-11 DIAGNOSIS — Z8542 Personal history of malignant neoplasm of other parts of uterus: Secondary | ICD-10-CM | POA: Diagnosis not present

## 2014-09-11 DIAGNOSIS — F17210 Nicotine dependence, cigarettes, uncomplicated: Secondary | ICD-10-CM | POA: Diagnosis present

## 2014-09-11 DIAGNOSIS — Z885 Allergy status to narcotic agent status: Secondary | ICD-10-CM | POA: Diagnosis not present

## 2014-09-11 DIAGNOSIS — D259 Leiomyoma of uterus, unspecified: Secondary | ICD-10-CM | POA: Diagnosis present

## 2014-09-11 DIAGNOSIS — Z720 Tobacco use: Secondary | ICD-10-CM

## 2014-09-11 DIAGNOSIS — R4182 Altered mental status, unspecified: Secondary | ICD-10-CM

## 2014-09-11 DIAGNOSIS — I639 Cerebral infarction, unspecified: Secondary | ICD-10-CM

## 2014-09-11 HISTORY — DX: Malignant (primary) neoplasm, unspecified: C80.1

## 2014-09-11 LAB — COMPREHENSIVE METABOLIC PANEL
ALBUMIN: 4.5 g/dL (ref 3.5–5.2)
ALT: 29 U/L (ref 0–35)
ANION GAP: 14 (ref 5–15)
AST: 23 U/L (ref 0–37)
Alkaline Phosphatase: 83 U/L (ref 39–117)
BILIRUBIN TOTAL: 0.6 mg/dL (ref 0.3–1.2)
BUN: 16 mg/dL (ref 6–23)
CALCIUM: 9.9 mg/dL (ref 8.4–10.5)
CHLORIDE: 108 meq/L (ref 96–112)
CO2: 19 mmol/L (ref 19–32)
CREATININE: 0.84 mg/dL (ref 0.50–1.10)
GFR calc Af Amer: >90 mL/min (ref >90)
GFR, EST NON AFRICAN AMERICAN: 79 mL/min — AB (ref >90)
Glucose, Bld: 110 mg/dL — ABNORMAL HIGH (ref 70–99)
Potassium: 3.6 mmol/L (ref 3.5–5.1)
Sodium: 141 mmol/L (ref 135–145)
Total Protein: 8 g/dL (ref 6.0–8.3)

## 2014-09-11 LAB — DIFFERENTIAL
BASOS PCT: 1 % (ref 0–1)
Basophils Absolute: 0.1 10*3/uL (ref 0.0–0.1)
EOS PCT: 2 % (ref 0–5)
Eosinophils Absolute: 0.2 10*3/uL (ref 0.0–0.7)
Lymphocytes Relative: 42 % (ref 12–46)
Lymphs Abs: 3.9 10*3/uL (ref 0.7–4.0)
MONOS PCT: 8 % (ref 3–12)
Monocytes Absolute: 0.8 10*3/uL (ref 0.1–1.0)
NEUTROS ABS: 4.4 10*3/uL (ref 1.7–7.7)
Neutrophils Relative %: 47 % (ref 43–77)

## 2014-09-11 LAB — URINALYSIS, ROUTINE W REFLEX MICROSCOPIC
Bilirubin Urine: NEGATIVE
GLUCOSE, UA: NEGATIVE mg/dL
Hgb urine dipstick: NEGATIVE
Ketones, ur: NEGATIVE mg/dL
Nitrite: NEGATIVE
PH: 8.5 — AB (ref 5.0–8.0)
Protein, ur: NEGATIVE mg/dL
Urobilinogen, UA: 0.2 mg/dL (ref 0.0–1.0)

## 2014-09-11 LAB — PROTIME-INR
INR: 0.95 (ref 0.00–1.49)
Prothrombin Time: 12.7 seconds (ref 11.6–15.2)

## 2014-09-11 LAB — CBC
HEMATOCRIT: 36.1 % (ref 36.0–46.0)
HEMOGLOBIN: 12 g/dL (ref 12.0–15.0)
MCH: 28 pg (ref 26.0–34.0)
MCHC: 33.2 g/dL (ref 30.0–36.0)
MCV: 84.3 fL (ref 78.0–100.0)
Platelets: 353 10*3/uL (ref 150–400)
RBC: 4.28 MIL/uL (ref 3.87–5.11)
RDW: 14.4 % (ref 11.5–15.5)
WBC: 9.2 10*3/uL (ref 4.0–10.5)

## 2014-09-11 LAB — I-STAT CHEM 8, ED
BUN: 14 mg/dL (ref 6–23)
CHLORIDE: 108 meq/L (ref 96–112)
CREATININE: 0.8 mg/dL (ref 0.50–1.10)
Calcium, Ion: 1.08 mmol/L — ABNORMAL LOW (ref 1.12–1.23)
Glucose, Bld: 117 mg/dL — ABNORMAL HIGH (ref 70–99)
HCT: 38 % (ref 36.0–46.0)
Hemoglobin: 12.9 g/dL (ref 12.0–15.0)
POTASSIUM: 3.3 mmol/L — AB (ref 3.5–5.1)
SODIUM: 142 mmol/L (ref 135–145)
TCO2: 18 mmol/L (ref 0–100)

## 2014-09-11 LAB — RAPID URINE DRUG SCREEN, HOSP PERFORMED
Amphetamines: NOT DETECTED
Barbiturates: NOT DETECTED
Benzodiazepines: POSITIVE — AB
Cocaine: NOT DETECTED
OPIATES: NOT DETECTED
Tetrahydrocannabinol: NOT DETECTED

## 2014-09-11 LAB — I-STAT TROPONIN, ED: TROPONIN I, POC: 0 ng/mL (ref 0.00–0.08)

## 2014-09-11 LAB — ETHANOL

## 2014-09-11 LAB — CBG MONITORING, ED: GLUCOSE-CAPILLARY: 116 mg/dL — AB (ref 70–99)

## 2014-09-11 LAB — APTT: APTT: 28 s (ref 24–37)

## 2014-09-11 LAB — URINE MICROSCOPIC-ADD ON

## 2014-09-11 MED ORDER — LORAZEPAM 2 MG/ML IJ SOLN
1.0000 mg | Freq: Once | INTRAMUSCULAR | Status: AC
  Administered 2014-09-11: 1 mg via INTRAVENOUS
  Filled 2014-09-11: qty 1

## 2014-09-11 MED ORDER — IOHEXOL 350 MG/ML SOLN
100.0000 mL | Freq: Once | INTRAVENOUS | Status: AC | PRN
  Administered 2014-09-11: 100 mL via INTRAVENOUS

## 2014-09-11 MED ORDER — STROKE: EARLY STAGES OF RECOVERY BOOK
Freq: Once | Status: AC
  Administered 2014-09-11: 04:00:00
  Filled 2014-09-11: qty 1

## 2014-09-11 MED ORDER — HEPARIN SODIUM (PORCINE) 5000 UNIT/ML IJ SOLN
5000.0000 [IU] | Freq: Three times a day (TID) | INTRAMUSCULAR | Status: DC
  Administered 2014-09-11 (×2): 5000 [IU] via SUBCUTANEOUS
  Filled 2014-09-11 (×7): qty 1

## 2014-09-11 MED ORDER — ACETAMINOPHEN 325 MG PO TABS
650.0000 mg | ORAL_TABLET | ORAL | Status: DC | PRN
  Administered 2014-09-11 (×2): 650 mg via ORAL
  Filled 2014-09-11 (×2): qty 2

## 2014-09-11 NOTE — ED Notes (Signed)
Patient transported to CT 

## 2014-09-11 NOTE — ED Notes (Signed)
Neurologist at bedside. 

## 2014-09-11 NOTE — Progress Notes (Signed)
EEG completed; results pending.    

## 2014-09-11 NOTE — ED Notes (Signed)
Pts niece states she last saw pt normal around 2200, states her daughter came and got her around 2300 and stated her aunt wasn't normal, states pt could not speak and would get couple words out. Pt is able to get couple words out but is mainly mute at this time, pt is staring in space, pt does not follow finger when asked, pt able to lift arms and keep lifted, pt able to move legs but not able to hold in air, pt responds to pain on legs.

## 2014-09-11 NOTE — ED Provider Notes (Signed)
CSN: [id number]     Arrival date & time 09/11/14  0013 History   First MD Initiated Contact with Patient 09/11/14 0020     Chief Complaint  Patient presents with  . Altered Mental Status  . Code Stroke     (Consider location/radiation/quality/duration/timing/severity/associated sxs/prior Treatment) HPI 53 year old female presents to emergency department from home with complaint of inability to speak and confusion.  Patient was last seen normal around 8:30.  Family reports over the last few days.  Patient has had some episodes of confusion and forgetfulness which they attributed to her being tired and starting new medications.  Patient recently diagnosed with endometrial cancer, status post hysterectomy November 10 at [facility].  Patient has not yet started chemotherapy yet, awaiting Port-A-Cath placement.  No prior history of stroke or confusion in the past.  Patient is able to write, reports being scared, confused. Past Medical History  Diagnosis Date  . Asthma   . Uterine fibroid    Past Surgical History  Procedure Laterality Date  . Tubal ligation    . Appendectomy     History reviewed. No pertinent family history. History  Substance Use Topics  . Smoking status: Current Every Day Smoker -- 0.50 packs/day    Types: Cigarettes  . Smokeless tobacco: Not on file  . Alcohol Use: Yes   OB History    No data available     Review of Systems  Unable to perform ROS: Acuity of condition      Allergies  Codeine  Home Medications   Prior to Admission medications   Medication Sig Start Date End Date Taking? Authorizing Provider  ferrous sulfate 325 (65 FE) MG EC tablet Take 325 mg by mouth 2 (two) times daily. 08/05/14 08/05/15 Yes Historical Provider, MD  Oxycodone HCl 10 MG TABS Take 10 mg by mouth every 4 (four) hours as needed. pain 08/05/14  Yes Historical Provider, MD  oxyCODONE-acetaminophen (PERCOCET) 10-325 MG per tablet Take 1 tablet by mouth every 6 (six) hours  as needed for pain.   Yes Historical Provider, MD  phenazopyridine (PYRIDIUM) 200 MG tablet Take 200 mg by mouth 3 (three) times daily. 08/10/14  Yes Historical Provider, MD  promethazine (PHENERGAN) 12.5 MG tablet Take 12.5 mg by mouth every 6 (six) hours as needed. nausea 08/05/14  Yes Historical Provider, MD  albuterol (PROVENTIL HFA;VENTOLIN HFA) 108 (90 BASE) MCG/ACT inhaler Inhale 2 puffs into the lungs every 2 (two) hours as needed for wheezing or shortness of breath (cough). Patient taking differently: Inhale 1 puff into the lungs every 6 (six) hours as needed for wheezing or shortness of breath (cough).  11/19/12   Babette Relic, MD  amoxicillin (AMOXIL) 500 MG tablet Take 2 tablets (1,000 mg total) by mouth 3 (three) times daily. Patient not taking: Reported on 09/11/2014 11/19/12   Babette Relic, MD  hydrOXYzine (VISTARIL) 50 MG capsule Take 50 mg by mouth 3 (three) times daily as needed. Itching    Historical Provider, MD  ibuprofen (ADVIL,MOTRIN) 200 MG tablet Take 200 mg by mouth every 6 (six) hours as needed.    Historical Provider, MD  nitrofurantoin, macrocrystal-monohydrate, (MACROBID) 100 MG capsule Take 100 mg by mouth daily. For 21 days 08/21/14 09/11/14  Historical Provider, MD  predniSONE (DELTASONE) 20 MG tablet 3 tabs po day one, then 2 po daily x 4 days 11/19/12   Babette Relic, MD  traMADol (ULTRAM) 50 MG tablet Take 1 tablet (50 mg total) by mouth every  6 (six) hours as needed. 02/15/14   Mariea Clonts, MD   BP 151/102 mmHg  Pulse 81  Temp(Src) 98.2 F (36.8 C) (Oral)  Resp 22  SpO2 100% Physical Exam  Constitutional: She appears well-developed and well-nourished. She appears distressed.  HENT:  Head: Normocephalic and atraumatic.  Nose: Nose normal.  Mouth/Throat: Oropharynx is clear and moist.  Eyes: Conjunctivae and EOM are normal. Pupils are equal, round, and reactive to light.  Neck: Normal range of motion. Neck supple. No JVD present. No tracheal deviation  present. No thyromegaly present.  Cardiovascular: Normal rate, regular rhythm, normal heart sounds and intact distal pulses.  Exam reveals no gallop and no friction rub.   No murmur heard. Pulmonary/Chest: Effort normal and breath sounds normal. No stridor. No respiratory distress. She has no wheezes. She has no rales. She exhibits no tenderness.  Abdominal: Soft. Bowel sounds are normal. She exhibits no distension and no mass. There is no tenderness. There is no rebound and no guarding.  Musculoskeletal: Normal range of motion. She exhibits no edema or tenderness.  Lymphadenopathy:    She has no cervical adenopathy.  Neurological: She is alert. Coordination abnormal.  Pt unable to answer questions, but randomly calls out "books", "I need oxygen".  Pt inconsistently follows commmands  Skin: Skin is warm and dry. No rash noted. No erythema. No pallor.  Nursing note and vitals reviewed.   ED Course  Procedures (including critical care time) Labs Review Labs Reviewed  I-STAT CHEM 8, ED - Abnormal; Notable for the following:    Potassium 3.3 (*)    Glucose, Bld 117 (*)    Calcium, Ion 1.08 (*)    All other components within normal limits  CBG MONITORING, ED - Abnormal; Notable for the following:    Glucose-Capillary 116 (*)    All other components within normal limits  ETHANOL  PROTIME-INR  APTT  CBC  DIFFERENTIAL  COMPREHENSIVE METABOLIC PANEL  URINE RAPID DRUG SCREEN (HOSP PERFORMED)  URINALYSIS, ROUTINE W REFLEX MICROSCOPIC  I-STAT TROPOININ, ED  I-STAT TROPOININ, ED    Imaging Review Ct Angio Head W/cm &/or Wo Cm  09/11/2014   CLINICAL DATA:  Word finding difficulties for 5 days, worsening tonight. Numbness and tingling on both sides of face. History of endometrial cancer.  EXAM: CT ANGIOGRAPHY HEAD  TECHNIQUE: Multidetector CT imaging of the head was performed using the standard protocol during bolus administration of intravenous contrast. Multiplanar CT image  reconstructions and MIPs were obtained to evaluate the vascular anatomy.  CONTRAST:  184mL OMNIPAQUE IOHEXOL 350 MG/ML SOLN  COMPARISON:  CT of the head November 24, 2004  FINDINGS: CT HEAD  Mild motion degraded examination. The ventricles and sulci are normal. No intraparenchymal hemorrhage, mass effect nor midline shift. No acute large vascular territory infarcts. No abnormal parenchymal nor extra-axial enhancement.  No abnormal extra-axial fluid collections. Basal cisterns are patent.  No skull fracture. The included ocular globes and orbital contents are non-suspicious. The mastoid aircells and included paranasal sinuses are well-aerated.  CTA HEAD  Anterior circulation: Normal appearance of the cervical internal carotid arteries, petrous, cavernous and supra clinoid internal carotid arteries. Widely patent anterior communicating artery. Widely patent anterior and middle cerebral arteries, however there is very mild luminal irregularity of the mid to distal branches.  Posterior circulation: Codominant vertebral arteries with normal appearance of the vertebral arteries, vertebrobasilar junction and basilar artery, as well as main branch vessels. Normal appearance of the posterior cerebral arteries. Rib os bilateral  posterior communicating arteries present.  No large vessel occlusion, hemodynamically significant stenosis, dissection, luminal irregularity, contrast extravasation or aneurysm within the anterior nor posterior circulation.  IMPRESSION: CT HEAD: Motion degraded examination without acute intracranial process.  CTA HEAD: Mild luminal irregularity of the mid to distal anterior and middle cerebral arteries, which can be seen with vasculopathy, less likely due to to motion artifact.  Acute findings discussed with and reconfirmed by Dr.Trissa Molina on 09/11/2014 at 1:27 am.   Electronically Signed   By: Elon Alas   On: 09/11/2014 01:27     EKG Interpretation   Date/Time:  Tuesday September 11 2014  00:27:03 EST Ventricular Rate:  90 PR Interval:  127 QRS Duration: 84 QT Interval:  415 QTC Calculation: 508 R Axis:   80 Text Interpretation:  Sinus rhythm Borderline prolonged QT interval  Baseline wander in lead(s) III V6 No significant change since last tracing  Confirmed by Brownie Gockel  MD, Anaiyah Anglemyer ([id number]) on 09/11/2014 12:54:11 AM      CRITICAL CARE Performed by: Kalman Drape Total critical care time: 30 min Critical care time was exclusive of separately billable procedures and treating other patients. Critical care was necessary to treat or prevent imminent or life-threatening deterioration. Critical care was time spent personally by me on the following activities: development of treatment plan with patient and/or surrogate as well as nursing, discussions with consultants, evaluation of patient's response to treatment, examination of patient, obtaining history from patient or surrogate, ordering and performing treatments and interventions, ordering and review of laboratory studies, ordering and review of radiographic studies, pulse oximetry and re-evaluation of patient's condition. MDM   Final diagnoses:  Aphasia    12:52 AM Code stroke initiated upon patients arrival given last seen normal 2030.  D/w DR Janann Colonel who requests CT angio of head, will come to [facility].    1:35 AM Pt not TPA candidate given waxing/waning symptoms, out of stroke window and concern given recent abdominal surgery.  Pt to be transferred to [facility] for admission and further workup and evaluation.    Kalman Drape, MD 09/11/14 [phone]

## 2014-09-11 NOTE — Progress Notes (Signed)
Patient seen by partner Dr. Janann Colonel earlier today. She states that she is having improvement. She has halting speech when being examined, however when I ask about her stressors she comes very fluent expressing her frustrations with her family and her diagnosis of cancer. She states that "I'm being so frustrated that I just feel like I need to shut down."  The inconsistency in her speech pattern is consistent with a psychogenic etiology.  Her EEG and MRI are both normal and no further investigations for neurological perspective need to be performed.  Could consider working with psychology for stress management.  Neurology to sign off at this time please call with any further questions or concerns.  Roland Rack, MD [facility] [phone]  If 7pm- 7am, please page neurology on call as listed in [place].

## 2014-09-11 NOTE — ED Notes (Signed)
Pt arrived tot the ED with a complaint of altered mental status.  Pt presents without the ability to talk.  Pt states she has had episode same.  Last presentation of normal was 2100 hrs tonight.  Pt is a cancer patient.

## 2014-09-11 NOTE — H&P (Signed)
[facility] History and Physical  RAZAN SILER PPJ:[id number] DOB: Nov 17, 1960 DOA: 09/11/2014  Referring physician: EDP PCP: No primary care provider on file.   Chief Complaint: AMS   HPI: Julia Osborne is a 53 y.o. female presents to ED from home with c/o inability to speak and confusion.  Patient LKW 830pm last evening for the most recent event.  Patient has been having intermittent episodes of confusion and forgetfulness over the past few days.  Patient has been under a lot of emotional stress recently after being diagnosed with endometrial CA stage 4, undergoing hysterectomy on Nov 10th at [facility], to start chemo in Jan with port placement then.  Patient describes intermittent word finding difficulty, difficulty getting words out and increased confusion.  Review of Systems: Systems reviewed.  As above, otherwise negative  Past Medical History  Diagnosis Date  . Asthma   . Uterine fibroid   . Cancer     endometrial   Past Surgical History  Procedure Laterality Date  . Tubal ligation    . Appendectomy    . Abdominal hysterectomy     Social History:  reports that she has been smoking Cigarettes.  She has been smoking about 0.50 packs per day. She does not have any smokeless tobacco history on file. She reports that she drinks alcohol. She reports that she does not use illicit drugs.  Allergies  Allergen Reactions  . Codeine Rash    History reviewed. No pertinent family history.   Prior to Admission medications   Medication Sig Start Date End Date Taking? Authorizing Provider  albuterol (PROVENTIL HFA;VENTOLIN HFA) 108 (90 BASE) MCG/ACT inhaler Inhale 2 puffs into the lungs every 2 (two) hours as needed for wheezing or shortness of breath (cough). Patient taking differently: Inhale 1 puff into the lungs every 6 (six) hours as needed for wheezing or shortness of breath (cough).  11/19/12  Yes Babette Relic, MD  amoxicillin (AMOXIL) 500 MG tablet Take 2 tablets (1,000 mg  total) by mouth 3 (three) times daily. Patient not taking: Reported on 09/11/2014 11/19/12   Babette Relic, MD  ferrous sulfate 325 (65 FE) MG EC tablet Take 325 mg by mouth 2 (two) times daily. 08/05/14 08/05/15  Historical Provider, MD  hydrOXYzine (VISTARIL) 50 MG capsule Take 50 mg by mouth 3 (three) times daily as needed. Itching    Historical Provider, MD  ibuprofen (ADVIL,MOTRIN) 200 MG tablet Take 200 mg by mouth every 6 (six) hours as needed.    Historical Provider, MD  nitrofurantoin, macrocrystal-monohydrate, (MACROBID) 100 MG capsule Take 100 mg by mouth daily. For 21 days 08/21/14 09/11/14  Historical Provider, MD  Oxycodone HCl 10 MG TABS Take 10 mg by mouth every 4 (four) hours as needed. pain 08/05/14   Historical Provider, MD  oxyCODONE-acetaminophen (PERCOCET/ROXICET) 5-325 MG per tablet Take 1-2 tablets by mouth every 6 (six) hours as needed for moderate pain or severe pain.    Historical Provider, MD  phenazopyridine (PYRIDIUM) 200 MG tablet Take 200 mg by mouth 3 (three) times daily. 08/10/14   Historical Provider, MD  predniSONE (DELTASONE) 20 MG tablet 3 tabs po day one, then 2 po daily x 4 days 11/19/12   Babette Relic, MD  promethazine (PHENERGAN) 12.5 MG tablet Take 12.5 mg by mouth every 6 (six) hours as needed. nausea 08/05/14   Historical Provider, MD  traMADol (ULTRAM) 50 MG tablet Take 1 tablet (50 mg total) by mouth every 6 (six) hours as needed.  02/15/14   Mariea Clonts, MD   Physical Exam: Filed Vitals:   09/11/14 0115  BP: 142/80  Pulse: 78  Temp:   Resp: 15    BP 142/80 mmHg  Pulse 78  Temp(Src) 98.2 F (36.8 C) (Oral)  Resp 15  SpO2 100%  General Appearance:    Alert, oriented, no distress, appears stated age  Head:    Normocephalic, atraumatic  Eyes:    PERRL, EOMI, sclera non-icteric        Nose:   Nares without drainage or epistaxis. Mucosa, turbinates normal  Throat:   Moist mucous membranes. Oropharynx without erythema or exudate.  Neck:    Supple. No carotid bruits.  No thyromegaly.  No lymphadenopathy.   Back:     No CVA tenderness, no spinal tenderness  Lungs:     Clear to auscultation bilaterally, without wheezes, rhonchi or rales  Chest wall:    No tenderness to palpitation  Heart:    Regular rate and rhythm without murmurs, gallops, rubs  Abdomen:     Soft, non-tender, nondistended, normal bowel sounds, no organomegaly  Genitalia:    deferred  Rectal:    deferred  Extremities:   No clubbing, cyanosis or edema.  Pulses:   2+ and symmetric all extremities  Skin:   Skin color, texture, turgor normal, no rashes or lesions  Lymph nodes:   Cervical, supraclavicular, and axillary nodes normal  Neurologic:   CNII-XII intact. Normal strength, sensation and reflexes      throughout    Labs on Admission:  Basic Metabolic Panel:  Recent Labs Lab 09/11/14 0026 09/11/14 0038  NA 141 142  K 3.6 3.3*  CL 108 108  CO2 19  --   GLUCOSE 110* 117*  BUN 16 14  CREATININE 0.84 0.80  CALCIUM 9.9  --    Liver Function Tests:  Recent Labs Lab 09/11/14 0026  AST 23  ALT 29  ALKPHOS 83  BILITOT 0.6  PROT 8.0  ALBUMIN 4.5   No results for input(s): LIPASE, AMYLASE in the last 168 hours. No results for input(s): AMMONIA in the last 168 hours. CBC:  Recent Labs Lab 09/11/14 0026 09/11/14 0038  WBC 9.2  --   NEUTROABS 4.4  --   HGB 12.0 12.9  HCT 36.1 38.0  MCV 84.3  --   PLT 353  --    Cardiac Enzymes: No results for input(s): CKTOTAL, CKMB, CKMBINDEX, TROPONINI in the last 168 hours.  BNP (last 3 results) No results for input(s): PROBNP in the last 8760 hours. CBG:  Recent Labs Lab 09/11/14 0029  GLUCAP 116*    Radiological Exams on Admission: Ct Angio Head W/cm &/or Wo Cm  09/11/2014   CLINICAL DATA:  Word finding difficulties for 5 days, worsening tonight. Numbness and tingling on both sides of face. History of endometrial cancer.  EXAM: CT ANGIOGRAPHY HEAD  TECHNIQUE: Multidetector CT imaging of  the head was performed using the standard protocol during bolus administration of intravenous contrast. Multiplanar CT image reconstructions and MIPs were obtained to evaluate the vascular anatomy.  CONTRAST:  147mL OMNIPAQUE IOHEXOL 350 MG/ML SOLN  COMPARISON:  CT of the head November 24, 2004  FINDINGS: CT HEAD  Mild motion degraded examination. The ventricles and sulci are normal. No intraparenchymal hemorrhage, mass effect nor midline shift. No acute large vascular territory infarcts. No abnormal parenchymal nor extra-axial enhancement.  No abnormal extra-axial fluid collections. Basal cisterns are patent.  No skull fracture. The included  ocular globes and orbital contents are non-suspicious. The mastoid aircells and included paranasal sinuses are well-aerated.  CTA HEAD  Anterior circulation: Normal appearance of the cervical internal carotid arteries, petrous, cavernous and supra clinoid internal carotid arteries. Widely patent anterior communicating artery. Widely patent anterior and middle cerebral arteries, however there is very mild luminal irregularity of the mid to distal branches.  Posterior circulation: Codominant vertebral arteries with normal appearance of the vertebral arteries, vertebrobasilar junction and basilar artery, as well as main branch vessels. Normal appearance of the posterior cerebral arteries. Rib os bilateral posterior communicating arteries present.  No large vessel occlusion, hemodynamically significant stenosis, dissection, luminal irregularity, contrast extravasation or aneurysm within the anterior nor posterior circulation.  IMPRESSION: CT HEAD: Motion degraded examination without acute intracranial process.  CTA HEAD: Mild luminal irregularity of the mid to distal anterior and middle cerebral arteries, which can be seen with vasculopathy, less likely due to to motion artifact.  Acute findings discussed with and reconfirmed by Dr.OLGA OTTER on 09/11/2014 at 1:27 am.   Electronically  Signed   By: Elon Alas   On: 09/11/2014 01:27    EKG: Independently reviewed.  Assessment/Plan Active Problems:   Aphasia   1. Aphasia - ruling out stroke, but suspicious that there may be a psychogenic component as well (I am suspicious of this as the patient herself is the one to suggest the possibility of a "nervous breakdown" on paper, her writing ability is also unusually fluent for a patient with a complete and dense aphasia). 1. MRI/MRA brain ordered to r/o stroke or other intracranial pathology. 2. CT/CTA no acute stroke, irregularity seen but could just be due to motion artifact. 3. On stroke pathway 4. Tele monitor 5. SLP ordered. 6. Neuro has already seen patient, their consult is in chart.    Code Status: Full Code  Family Communication: Family at bedside Disposition Plan: Admit to inpatient.   Time spent: 70 min  GARDNER, JARED M. [facility] Pager [phone]  If 7AM-7PM, please contact the day team taking care of the patient [email or web address] Password [email or web address] 09/11/2014, 1:55 AM

## 2014-09-11 NOTE — ED Notes (Signed)
Pt started talking saying, "can't breathe, need oxygen". Pt placed on 2L [place].

## 2014-09-11 NOTE — Discharge Summary (Signed)
Discharge Summary  Julia Osborne UYQ:[id number] DOB: June 13, 1961  PCP: No primary care provider on file.  Admit date: 09/11/2014 Discharge date: 09/11/2014  Time spent: 25 minutes  Recommendations for Outpatient Follow-up:  1. Patient will follow-up the primary care physician as scheduled  Discharge Diagnoses:  Active Hospital Problems   Diagnosis Date Noted  . Aphasia 09/11/2014  Tobacco abuse Stage IV endometrial cancer Asthma   Resolved Hospital Problems   Diagnosis Date Noted Date Resolved  No resolved problems to display.    Discharge Condition: Improved, being discharged home  Diet recommendation: Regular  Filed Weights   09/11/14 0325  Weight: 60.328 kg (133 lb)    History of present illness:  53 year female past mental history of stage IV endometrial cancer undergoing chemotherapy who presented to the emergency room on 12/21 evening with inability to speak and confusion. Initial CT scan was negative and initial labs were unrevealing. There was a concern that this could be more psychogenic component as the patient was able to converse quite fluidly by paper and even talked about she felt that she was having a nervous breakdown.  Hospital Course:  Active Problems:   Aphasia: Looks to be non-stroke related. Symptoms by the following day had improved. The patient at time when distracted with converse with random people. In discussion she stated that she was under severe amount of stress. Her MRI was normal despite symptoms coming and going at different times. It was felt like this is more of a conversion reaction from stress.  An EEG was done which was unrevealing  Stage IV endometrial cancer: Continue chemotherapy plans, port recently placed  Tobacco abuse: Patient counseled   Procedures:  EEG: Unrevealing  Consultations:  Neurology  Discharge Exam: BP 115/70 mmHg  Pulse 82  Temp(Src) 98.3 F (36.8 C) (Oral)  Resp 16  Ht 5\' 4"  (1.626 m)  Wt 60.328 kg  (133 lb)  BMI 22.82 kg/m2  SpO2 99%  General: Alert and oriented 3, no acute distress Cardiovascular: Regular rate and rhythm, S1-S2 Respiratory: Clear to auscultation bilaterally  Discharge Instructions You were cared for by a hospitalist during your hospital stay. If you have any questions about your discharge medications or the care you received while you were in the hospital after you are discharged, you can call the unit and asked to speak with the hospitalist on call if the hospitalist that took care of you is not available. Once you are discharged, your primary care physician will handle any further medical issues. Please note that NO REFILLS for any discharge medications will be authorized once you are discharged, as it is imperative that you return to your primary care physician (or establish a relationship with a primary care physician if you do not have one) for your aftercare needs so that they can reassess your need for medications and monitor your lab values.  Discharge Instructions    Diet - low sodium heart healthy    Complete by:  As directed      Increase activity slowly    Complete by:  As directed             Medication List    STOP taking these medications        amoxicillin 500 MG tablet  Commonly known as:  AMOXIL      TAKE these medications        albuterol 108 (90 BASE) MCG/ACT inhaler  Commonly known as:  PROVENTIL HFA;VENTOLIN HFA  Inhale 2  puffs into the lungs every 2 (two) hours as needed for wheezing or shortness of breath (cough).     ferrous sulfate 325 (65 FE) MG EC tablet  Take 325 mg by mouth 2 (two) times daily.     hydrOXYzine 50 MG capsule  Commonly known as:  VISTARIL  Take 50 mg by mouth 3 (three) times daily as needed. Itching     ibuprofen 200 MG tablet  Commonly known as:  ADVIL,MOTRIN  Take 200 mg by mouth every 6 (six) hours as needed.     nitrofurantoin (macrocrystal-monohydrate) 100 MG capsule  Commonly known as:  MACROBID    Take 100 mg by mouth daily. For 21 days     Oxycodone HCl 10 MG Tabs  Take 10 mg by mouth every 4 (four) hours as needed. pain     oxyCODONE-acetaminophen 5-325 MG per tablet  Commonly known as:  PERCOCET/ROXICET  Take 1-2 tablets by mouth every 6 (six) hours as needed for moderate pain or severe pain.     predniSONE 20 MG tablet  Commonly known as:  DELTASONE  3 tabs po day one, then 2 po daily x 4 days     promethazine 12.5 MG tablet  Commonly known as:  PHENERGAN  Take 12.5 mg by mouth every 6 (six) hours as needed. nausea     PYRIDIUM 200 MG tablet  Generic drug:  phenazopyridine  Take 200 mg by mouth 3 (three) times daily.     traMADol 50 MG tablet  Commonly known as:  ULTRAM  Take 1 tablet (50 mg total) by mouth every 6 (six) hours as needed.       Allergies  Allergen Reactions  . Codeine Rash      The results of significant diagnostics from this hospitalization (including imaging, microbiology, ancillary and laboratory) are listed below for reference.    Significant Diagnostic Studies: Ct Angio Head W/cm &/or Wo Cm  09/11/2014   CLINICAL DATA:  Word finding difficulties for 5 days, worsening tonight. Numbness and tingling on both sides of face. History of endometrial cancer.  EXAM: CT ANGIOGRAPHY HEAD  TECHNIQUE: Multidetector CT imaging of the head was performed using the standard protocol during bolus administration of intravenous contrast. Multiplanar CT image reconstructions and MIPs were obtained to evaluate the vascular anatomy.  CONTRAST:  137mL OMNIPAQUE IOHEXOL 350 MG/ML SOLN  COMPARISON:  CT of the head November 24, 2004  FINDINGS: CT HEAD  Mild motion degraded examination. The ventricles and sulci are normal. No intraparenchymal hemorrhage, mass effect nor midline shift. No acute large vascular territory infarcts. No abnormal parenchymal nor extra-axial enhancement.  No abnormal extra-axial fluid collections. Basal cisterns are patent.  No skull fracture. The  included ocular globes and orbital contents are non-suspicious. The mastoid aircells and included paranasal sinuses are well-aerated.  CTA HEAD  Anterior circulation: Normal appearance of the cervical internal carotid arteries, petrous, cavernous and supra clinoid internal carotid arteries. Widely patent anterior communicating artery. Widely patent anterior and middle cerebral arteries, however there is very mild luminal irregularity of the mid to distal branches.  Posterior circulation: Codominant vertebral arteries with normal appearance of the vertebral arteries, vertebrobasilar junction and basilar artery, as well as main branch vessels. Normal appearance of the posterior cerebral arteries. Rib os bilateral posterior communicating arteries present.  No large vessel occlusion, hemodynamically significant stenosis, dissection, luminal irregularity, contrast extravasation or aneurysm within the anterior nor posterior circulation.  IMPRESSION: CT HEAD: Motion degraded examination without acute intracranial process.  CTA HEAD: Mild luminal irregularity of the mid to distal anterior and middle cerebral arteries, which can be seen with vasculopathy, less likely due to to motion artifact.  Acute findings discussed with and reconfirmed by Dr.OLGA OTTER on 09/11/2014 at 1:27 am.   Electronically Signed   By: Elon Alas   On: 09/11/2014 01:27   Mr Brain Wo Contrast  09/11/2014   CLINICAL DATA:  Inability to speak, confusion for a few days. Recent diagnosis of stage IV endometrial cancer.  EXAM: MRI HEAD WITHOUT CONTRAST  MRA HEAD WITHOUT CONTRAST  TECHNIQUE: Multiplanar, multiecho pulse sequences of the brain and surrounding structures were obtained without intravenous contrast. Angiographic images of the head were obtained using MRA technique without contrast.  COMPARISON:  CT of the head September 11, 2014  FINDINGS: MRI HEAD FINDINGS  The ventricles and sulci are normal. No abnormal parenchymal signal, mass  lesions or mass of affect. No reduced diffusion to suggest acute ischemia. No susceptibility artifact to suggest hemorrhage. Mild motion degraded sagittal T1 sequence, cerebellar tonsils descend 1-2 mm below the foramen magnum though, are not pointed in appearance, not diagnostic for Chiari 1 malformation. Moderately motion degraded coronal T2 appearing  No abnormal extra-axial fluid collection. No abnormal calvarial bone marrow signal. Ocular globes and orbital contents are unremarkable though not tailored for evaluation. Mild paranasal sinus mucosal thickening without air-fluid levels. Mastoid air cells appear well-aerated. No abnormal sellar expansion. Craniocervical junction is maintained.  MRA HEAD FINDINGS  Mildly motion degraded examination.  Anterior circulation: Normal flow related enhancement of the included cervical, petrous, cavernous and supra clinoid internal carotid arteries. Patent anterior communicating artery. Normal flow related enhancement of the anterior and middle cerebral arteries, including more distal segments.  No large vessel occlusion, hemodynamically significant stenosis, data abnormal luminal irregularity, definite aneurysm. Motion degrades sensitivity.  Posterior circulation: Codominant vertebral arteries. Basilar artery is patent, with normal flow related enhancement of the main branch vessels. Normal flow related enhancement of the posterior cerebral arteries. Robust bilateral posterior communicating arteries present.  No large vessel occlusion, hemodynamically significant stenosis, definite abnormal luminal irregularity, definite aneurysm. Motion degrades sensitivity.  IMPRESSION: MRI HEAD: Motion degraded examination. No acute intracranial process on this nonenhanced examination. Mild low-lying cerebellar tonsils not diagnostic for Chiari 1 malformation.  MRA HEAD: Motion degraded examination. No central large vessel occlusion or hemodynamically significant stenosis. Possible  vasculopathy on prior CT is not appreciated on this motion degraded study.   Electronically Signed   By: Elon Alas   On: 09/11/2014 05:58   Mr Jodene Nam Head/brain Wo Cm  09/11/2014   CLINICAL DATA:  Inability to speak, confusion for a few days. Recent diagnosis of stage IV endometrial cancer.  EXAM: MRI HEAD WITHOUT CONTRAST  MRA HEAD WITHOUT CONTRAST  TECHNIQUE: Multiplanar, multiecho pulse sequences of the brain and surrounding structures were obtained without intravenous contrast. Angiographic images of the head were obtained using MRA technique without contrast.  COMPARISON:  CT of the head September 11, 2014  FINDINGS: MRI HEAD FINDINGS  The ventricles and sulci are normal. No abnormal parenchymal signal, mass lesions or mass of affect. No reduced diffusion to suggest acute ischemia. No susceptibility artifact to suggest hemorrhage. Mild motion degraded sagittal T1 sequence, cerebellar tonsils descend 1-2 mm below the foramen magnum though, are not pointed in appearance, not diagnostic for Chiari 1 malformation. Moderately motion degraded coronal T2 appearing  No abnormal extra-axial fluid collection. No abnormal calvarial bone marrow signal. Ocular globes and orbital contents are  unremarkable though not tailored for evaluation. Mild paranasal sinus mucosal thickening without air-fluid levels. Mastoid air cells appear well-aerated. No abnormal sellar expansion. Craniocervical junction is maintained.  MRA HEAD FINDINGS  Mildly motion degraded examination.  Anterior circulation: Normal flow related enhancement of the included cervical, petrous, cavernous and supra clinoid internal carotid arteries. Patent anterior communicating artery. Normal flow related enhancement of the anterior and middle cerebral arteries, including more distal segments.  No large vessel occlusion, hemodynamically significant stenosis, data abnormal luminal irregularity, definite aneurysm. Motion degrades sensitivity.  Posterior  circulation: Codominant vertebral arteries. Basilar artery is patent, with normal flow related enhancement of the main branch vessels. Normal flow related enhancement of the posterior cerebral arteries. Robust bilateral posterior communicating arteries present.  No large vessel occlusion, hemodynamically significant stenosis, definite abnormal luminal irregularity, definite aneurysm. Motion degrades sensitivity.  IMPRESSION: MRI HEAD: Motion degraded examination. No acute intracranial process on this nonenhanced examination. Mild low-lying cerebellar tonsils not diagnostic for Chiari 1 malformation.  MRA HEAD: Motion degraded examination. No central large vessel occlusion or hemodynamically significant stenosis. Possible vasculopathy on prior CT is not appreciated on this motion degraded study.   Electronically Signed   By: Elon Alas   On: 09/11/2014 05:58    Microbiology: No results found for this or any previous visit (from the past 240 hour(s)).   Labs: Basic Metabolic Panel:  Recent Labs Lab 09/11/14 0026 09/11/14 0038  NA 141 142  K 3.6 3.3*  CL 108 108  CO2 19  --   GLUCOSE 110* 117*  BUN 16 14  CREATININE 0.84 0.80  CALCIUM 9.9  --    Liver Function Tests:  Recent Labs Lab 09/11/14 0026  AST 23  ALT 29  ALKPHOS 83  BILITOT 0.6  PROT 8.0  ALBUMIN 4.5   No results for input(s): LIPASE, AMYLASE in the last 168 hours. No results for input(s): AMMONIA in the last 168 hours. CBC:  Recent Labs Lab 09/11/14 0026 09/11/14 0038  WBC 9.2  --   NEUTROABS 4.4  --   HGB 12.0 12.9  HCT 36.1 38.0  MCV 84.3  --   PLT 353  --    Cardiac Enzymes: No results for input(s): CKTOTAL, CKMB, CKMBINDEX, TROPONINI in the last 168 hours. BNP: BNP (last 3 results) No results for input(s): PROBNP in the last 8760 hours. CBG:  Recent Labs Lab 09/11/14 0029  GLUCAP 116*       Signed:  Annita Brod  [facility] 09/11/2014, 5:27 PM

## 2014-09-11 NOTE — ED Notes (Signed)
EDP Otter at bedside.

## 2014-09-11 NOTE — Procedures (Signed)
History: 53 year old female with speech abnormalities  Sedation: None  Technique: This is a 17 channel routine scalp EEG performed at the bedside with bipolar and monopolar montages arranged in accordance to the international 10/20 system of electrode placement. One channel was dedicated to EKG recording.    Background: There is a well defined posterior dominant rhythm of 10 Hz that attenuates with eye opening. The background during the waking state consists of intermixed alpha and beta activities. There is increased delta associated with drowsiness. Normal sleep structures including K complexes, vertex waves, and spindles are seen within normal distribution.  Photic stimulation: Physiologic driving is present  EEG Abnormalities: None  Clinical Interpretation: This normal EEG is recorded in the waking and sleep state. There was no seizure or seizure predisposition recorded on this study.   Roland Rack, MD [facility] [phone]  If 7pm- 7am, please page neurology on call as listed in [place].

## 2014-09-11 NOTE — Progress Notes (Signed)
Pt arrived to [facility] via [facility]. Pt currently has receptive and expressive aphasia, but able to communicate with writing and signs. Oriented to room. Placed on tele for monitoring. Requested order for Ativan for MRI since pt wrote down that she was claustrophobic. Will be taken down for MRI shortly.

## 2014-09-11 NOTE — Progress Notes (Signed)
Reviewed discharge documents with patient and family members. Pt verbalized understanding. Encouraged her to follow up with smoking cessation, and other resources in the community as outlined in the handouts provided. Niece provided transportation to home. Left at 1830.

## 2014-09-11 NOTE — ED Notes (Signed)
Pt placed on cardiac monitoring.  

## 2014-09-11 NOTE — Consult Note (Addendum)
Stroke Consult    Chief Complaint: altered mental status HPI: Julia Osborne is an 53 y.o. female presents to emergency department from home with complaint of inability to speak and confusion. Patient was last seen normal around 8:30pm for most recent event. Family reports over the last few days. Patient has had some episodes of confusion and forgetfulness which they attributed to her being tired and starting new medications. Patient recently diagnosed with endometrial cancer, status post hysterectomy November 10 at [place]. Patient has not yet started chemotherapy yet, awaiting Port-A-Cath placement. No prior history of stroke or confusion in the past. Patient is able to write and get her thoughts out via writing.  Per discussion with patients family, these symptoms have been ongoing for around 5 days, occuring off and on. Family describes word finding difficulty, trouble getting her words out and increased confusion. Family notes today patient had pauses in her speech, appeared to lose her train of though and then would start over again.      Date last known well: 09/10/2014 Time last known well: 2030 tPA Given: no, outside of IV tPA window by time of evaluation  Past Medical History  Diagnosis Date  . Asthma   . Uterine fibroid   . Cancer     endometrial    Past Surgical History  Procedure Laterality Date  . Tubal ligation    . Appendectomy    . Abdominal hysterectomy      History reviewed. No pertinent family history. Social History:  reports that she has been smoking Cigarettes.  She has been smoking about 0.50 packs per day. She does not have any smokeless tobacco history on file. She reports that she drinks alcohol. She reports that she does not use illicit drugs.  Allergies:  Allergies  Allergen Reactions  . Codeine Rash     (Not in a hospital admission)  ROS: Out of a complete 14 system review, the patient complains of only the following symptoms, and all  other reviewed systems are negative. -unable to obtain due to language  CT head imaging reviewed and no acute process noted   Physical Examination: Filed Vitals:   09/11/14 0029  BP: 151/102  Pulse: 81  Temp: 98.2 F (36.8 C)  Resp: 22   Physical Exam  Constitutional: He appears well-developed and well-nourished.  Psych: Affect appropriate to situation Eyes: No scleral injection HENT: No OP obstrucion Head: Normocephalic.  Cardiovascular: Normal rate and regular rhythm.  Respiratory: Effort normal and breath sounds normal.  GI: Soft. Bowel sounds are normal. No distension. There is no tenderness.  Skin: WDI   Neurologic Examination: Mental Status: Alert, eyes open. Will occasionally follow commands (appears effort dependent). Appears to have marked verbal expressive and receptive aphasia though listens and interacts via hand gestures and writing down answers. Able to identify pen and write the name down but not able to speak it. Not repeating.   Cranial Nerves: II: funduscopic exam wnl bilaterally, visual fields grossly normal, pupils equal, round, reactive to light and accommodation III,IV, VI: ptosis not present, not following thumb but will track examiner around the room V,VII: smile symmetric, facial light touch sensation normal bilaterally VIII: hearing normal bilaterally IX,X: gag reflex present XI: trapezius strength/neck flexion strength normal bilaterally XII: tongue strength normal  Motor: Not following commands but holds both UE against gravity and light resistance Not holding bilateral LE against gravity but will withdrawal briskly and symmetrically against light resistance Tone and bulk:normal tone throughout; no atrophy  noted Sensory: withdrawals briskly in all extremities to noxious stimuli Deep Tendon Reflexes: 2+ and symmetric throughout Plantars: Right: downgoing   Left: downgoing Cerebellar: Unable to obtain  Gait: deferred due to fall  risk  Laboratory Studies:   Basic Metabolic Panel:  Recent Labs Lab 09/11/14 0038  NA 142  K 3.3*  CL 108  GLUCOSE 117*  BUN 14  CREATININE 0.80    Liver Function Tests: No results for input(s): AST, ALT, ALKPHOS, BILITOT, PROT, ALBUMIN in the last 168 hours. No results for input(s): LIPASE, AMYLASE in the last 168 hours. No results for input(s): AMMONIA in the last 168 hours.  CBC:  Recent Labs Lab 09/11/14 0038  HGB 12.9  HCT 38.0    Cardiac Enzymes: No results for input(s): CKTOTAL, CKMB, CKMBINDEX, TROPONINI in the last 168 hours.  BNP: Invalid input(s): POCBNP  CBG:  Recent Labs Lab 09/11/14 0029  GLUCAP 116*    Microbiology: Results for orders placed or performed during the hospital encounter of 02/15/14  Wet prep, genital     Status: None   Collection Time: 02/15/14 12:10 PM  Result Value Ref Range Status   Yeast Wet Prep HPF POC NONE SEEN NONE SEEN Final   Trich, Wet Prep NONE SEEN NONE SEEN Final   Clue Cells Wet Prep HPF POC NONE SEEN NONE SEEN Final   WBC, Wet Prep HPF POC NONE SEEN NONE SEEN Final  GC/Chlamydia Probe Amp (multiple spec sources)     Status: None   Collection Time: 02/15/14 12:12 PM  Result Value Ref Range Status   CT Probe RNA NEGATIVE NEGATIVE Final   GC Probe RNA NEGATIVE NEGATIVE Final    Comment: (NOTE)                                                                                       **Normal Reference Range: Negative**      Assay performed using the Gen-Probe APTIMA COMBO2 (R) Assay. Acceptable specimen types for this assay include APTIMA Swabs (Unisex, endocervical, urethral, or vaginal), first void urine, and ThinPrep liquid based cytology samples. Performed at [facility]    Coagulation Studies: No results for input(s): LABPROT, INR in the last 72 hours.  Urinalysis: No results for input(s): COLORURINE, LABSPEC, PHURINE, GLUCOSEU, HGBUR, BILIRUBINUR, KETONESUR, PROTEINUR, UROBILINOGEN, NITRITE,  LEUKOCYTESUR in the last 168 hours.  Invalid input(s): APPERANCEUR  Lipid Panel:  No results found for: CHOL, TRIG, HDL, CHOLHDL, VLDL, LDLCALC  HgbA1C: No results found for: HGBA1C  Urine Drug Screen:  No results found for: LABOPIA, COCAINSCRNUR, LABBENZ, AMPHETMU, THCU, LABBARB  Alcohol Level: No results for input(s): ETH in the last 168 hours.  Imaging: No results found.  Assessment: 53 y.o. female with history of recent diagnosis of endometrial CA, s/p hysterectomy in November presenting with recurrent episodes of confusion and speech difficulties for the past 5 days. These episodes became more pronounced around 2030 on 09-12-2023. Unclear nature of her speech difficulties. She appears to have a marked expressive and receptive aphasia but will intermittently follow some commands and can communicate via non-verbal means (writing in a notebook). Exam is non-focal from a motor and sensory  standpoint. Differential would include a left MCA infarct vs seizure/post-ictal. With history of endometrial CA have concern for possible metastatic disease. Would consider stress/anxiety as playing a role in her symptoms, though will be a diagnosis of exclusion.    Plan:  1)MRI brain with and without contrast. If positive for stroke would complete stroke workup 2)EEG 3)UA, drug screen, EtOH levels 4)Further workup pending above results   Jim Like, DO [facility] [phone]  If 7pm- 7am, please page neurology on call as listed in [place]. 09/11/2014, 12:47 AM

## 2014-09-11 NOTE — ED Notes (Signed)
Pt unable to do stroke swallow screen at this time.

## 2014-09-15 ENCOUNTER — Emergency Department (HOSPITAL_COMMUNITY): Payer: Medicaid Other

## 2014-09-15 ENCOUNTER — Emergency Department (HOSPITAL_COMMUNITY)
Admission: EM | Admit: 2014-09-15 | Discharge: 2014-09-16 | Disposition: A | Discharge status: Other Acute Inpatient Hospital | Payer: Medicaid Other | Attending: Emergency Medicine | Admitting: Emergency Medicine

## 2014-09-15 ENCOUNTER — Encounter (HOSPITAL_COMMUNITY): Payer: Self-pay | Admitting: Emergency Medicine

## 2014-09-15 DIAGNOSIS — F17210 Nicotine dependence, cigarettes, uncomplicated: Secondary | ICD-10-CM | POA: Insufficient documentation

## 2014-09-15 DIAGNOSIS — R569 Unspecified convulsions: Secondary | ICD-10-CM | POA: Diagnosis not present

## 2014-09-15 DIAGNOSIS — F329 Major depressive disorder, single episode, unspecified: Secondary | ICD-10-CM

## 2014-09-15 DIAGNOSIS — F449 Dissociative and conversion disorder, unspecified: Secondary | ICD-10-CM

## 2014-09-15 DIAGNOSIS — F32A Depression, unspecified: Secondary | ICD-10-CM

## 2014-09-15 DIAGNOSIS — R52 Pain, unspecified: Secondary | ICD-10-CM

## 2014-09-15 LAB — COMPREHENSIVE METABOLIC PANEL
ALT: 22 U/L (ref 0–35)
AST: 16 U/L (ref 0–37)
Albumin: 4.2 g/dL (ref 3.5–5.2)
Alkaline Phosphatase: 68 U/L (ref 39–117)
Anion gap: 8 (ref 5–15)
BUN: 9 mg/dL (ref 6–23)
CHLORIDE: 110 meq/L (ref 96–112)
CO2: 21 mmol/L (ref 19–32)
CREATININE: 0.73 mg/dL (ref 0.50–1.10)
Calcium: 9.3 mg/dL (ref 8.4–10.5)
GFR calc Af Amer: >90 mL/min (ref >90)
Glucose, Bld: 111 mg/dL — ABNORMAL HIGH (ref 70–99)
POTASSIUM: 3.4 mmol/L — AB (ref 3.5–5.1)
Sodium: 139 mmol/L (ref 135–145)
Total Bilirubin: 0.3 mg/dL (ref 0.3–1.2)
Total Protein: 7.3 g/dL (ref 6.0–8.3)

## 2014-09-15 LAB — CBC WITH DIFFERENTIAL/PLATELET
BASOS ABS: 0 10*3/uL (ref 0.0–0.1)
Basophils Relative: 1 % (ref 0–1)
Eosinophils Absolute: 0.1 10*3/uL (ref 0.0–0.7)
Eosinophils Relative: 2 % (ref 0–5)
HCT: 34.4 % — ABNORMAL LOW (ref 36.0–46.0)
HEMOGLOBIN: 11.8 g/dL — AB (ref 12.0–15.0)
LYMPHS PCT: 28 % (ref 12–46)
Lymphs Abs: 2.1 10*3/uL (ref 0.7–4.0)
MCH: 28.8 pg (ref 26.0–34.0)
MCHC: 34.3 g/dL (ref 30.0–36.0)
MCV: 83.9 fL (ref 78.0–100.0)
MONO ABS: 0.6 10*3/uL (ref 0.1–1.0)
MONOS PCT: 7 % (ref 3–12)
NEUTROS ABS: 4.7 10*3/uL (ref 1.7–7.7)
Neutrophils Relative %: 62 % (ref 43–77)
Platelets: 355 10*3/uL (ref 150–400)
RBC: 4.1 MIL/uL (ref 3.87–5.11)
RDW: 14.4 % (ref 11.5–15.5)
WBC: 7.6 10*3/uL (ref 4.0–10.5)

## 2014-09-15 MED ORDER — HYDROXYZINE HCL 50 MG PO TABS
50.0000 mg | ORAL_TABLET | Freq: Three times a day (TID) | ORAL | Status: DC | PRN
  Filled 2014-09-15: qty 1

## 2014-09-15 MED ORDER — ALBUTEROL SULFATE HFA 108 (90 BASE) MCG/ACT IN AERS: 1.0000 | INHALATION_SPRAY | Freq: Four times a day (QID) | RESPIRATORY_TRACT | Status: DC | PRN

## 2014-09-15 MED ORDER — PROMETHAZINE HCL 25 MG PO TABS: 12.5000 mg | ORAL_TABLET | Freq: Four times a day (QID) | ORAL | Status: DC | PRN

## 2014-09-15 MED ORDER — PHENAZOPYRIDINE HCL 200 MG PO TABS
200.0000 mg | ORAL_TABLET | Freq: Three times a day (TID) | ORAL | Status: DC
  Administered 2014-09-16 (×2): 200 mg via ORAL
  Filled 2014-09-15 (×2): qty 1

## 2014-09-15 MED ORDER — FERROUS SULFATE 325 (65 FE) MG PO TABS
325.0000 mg | ORAL_TABLET | Freq: Two times a day (BID) | ORAL | Status: DC
  Administered 2014-09-16 (×2): 325 mg via ORAL
  Filled 2014-09-15 (×6): qty 1

## 2014-09-15 MED ORDER — OXYCODONE-ACETAMINOPHEN 5-325 MG PO TABS: 1.0000 | ORAL_TABLET | Freq: Four times a day (QID) | ORAL | Status: DC | PRN

## 2014-09-15 MED ORDER — LORAZEPAM 2 MG/ML IJ SOLN
1.0000 mg | Freq: Once | INTRAMUSCULAR | Status: AC
  Administered 2014-09-15: 1 mg via INTRAVENOUS
  Filled 2014-09-15: qty 1

## 2014-09-15 MED ORDER — IBUPROFEN 200 MG PO TABS: 200.0000 mg | ORAL_TABLET | Freq: Four times a day (QID) | ORAL | Status: DC | PRN

## 2014-09-15 NOTE — ED Notes (Signed)
Patient arrives via EMS from home with c/o seizure like activity at home. EMS reports clinching and shaking, patient was alert. Non-verbal on arrival, is able to answer simple questions. Is able to express that she is having expressive aphasia. Understands what is being asked, but that she just can't talk. Very anxious.

## 2014-09-15 NOTE — ED Notes (Signed)
Patient resting at this time, eyes closed. Even rise and fall of chest NAD noted

## 2014-09-15 NOTE — ED Notes (Signed)
Right before family walked in room, patient physically stated "am I safe". Family walked in room immediately after she stated this and patient started crying and shaking again. MD made aware.

## 2014-09-15 NOTE — ED Notes (Signed)
At this time patient is completely verbal without deficit. Patient is A&OX4, family remains at bedside.

## 2014-09-15 NOTE — ED Provider Notes (Addendum)
CSN: [id number]     Arrival date & time 09/15/14  1749 History   First MD Initiated Contact with Patient 09/15/14 1801     Chief Complaint  Patient presents with  . Seizures     (Consider location/radiation/quality/duration/timing/severity/associated sxs/prior Treatment) Patient is a 53 y.o. female presenting with seizures. The history is provided by the EMS personnel (the pt was shaking and not speaking).  Seizures Seizure activity on arrival: no   Seizure type:  Partial simple Preceding symptoms: no sensation of an aura present   Initial focality:  None Episode characteristics: no apnea   Postictal symptoms: no confusion   Return to baseline: no   Severity:  Moderate   Past Medical History  Diagnosis Date  . Asthma   . Uterine fibroid   . Cancer     endometrial   Past Surgical History  Procedure Laterality Date  . Tubal ligation    . Appendectomy    . Abdominal hysterectomy     No family history on file. History  Substance Use Topics  . Smoking status: Current Every Day Smoker -- 0.50 packs/day    Types: Cigarettes  . Smokeless tobacco: Not on file  . Alcohol Use: Yes   OB History    No data available     Review of Systems  Constitutional: Negative for appetite change and fatigue.  HENT: Negative for congestion, ear discharge and sinus pressure.   Eyes: Negative for discharge.  Respiratory: Negative for cough.   Cardiovascular: Negative for chest pain.  Gastrointestinal: Negative for abdominal pain and diarrhea.  Genitourinary: Negative for frequency and hematuria.  Musculoskeletal: Negative for back pain.  Skin: Negative for rash.  Neurological: Positive for seizures and speech difficulty. Negative for headaches.  Psychiatric/Behavioral: Negative for hallucinations.      Allergies  Codeine  Home Medications   Prior to Admission medications   Medication Sig Start Date End Date Taking? Authorizing Provider  albuterol (PROVENTIL HFA;VENTOLIN HFA)  108 (90 BASE) MCG/ACT inhaler Inhale 2 puffs into the lungs every 2 (two) hours as needed for wheezing or shortness of breath (cough). Patient taking differently: Inhale 1 puff into the lungs every 6 (six) hours as needed for wheezing or shortness of breath (cough).  11/19/12  Yes Babette Relic, MD  ferrous sulfate 325 (65 FE) MG EC tablet Take 325 mg by mouth 2 (two) times daily. 08/05/14 08/05/15 Yes Historical Provider, MD  hydrOXYzine (VISTARIL) 50 MG capsule Take 50 mg by mouth 3 (three) times daily as needed for anxiety.    Yes Historical Provider, MD  phenazopyridine (PYRIDIUM) 200 MG tablet Take 200 mg by mouth 3 (three) times daily. 08/10/14  Yes Historical Provider, MD  ibuprofen (ADVIL,MOTRIN) 200 MG tablet Take 200 mg by mouth every 6 (six) hours as needed.    Historical Provider, MD  Oxycodone HCl 10 MG TABS Take 10 mg by mouth every 4 (four) hours as needed. pain 08/05/14   Historical Provider, MD  oxyCODONE-acetaminophen (PERCOCET/ROXICET) 5-325 MG per tablet Take 1-2 tablets by mouth every 6 (six) hours as needed for moderate pain or severe pain.    Historical Provider, MD  predniSONE (DELTASONE) 20 MG tablet 3 tabs po day one, then 2 po daily x 4 days Patient not taking: Reported on 09/15/2014 11/19/12   Babette Relic, MD  promethazine (PHENERGAN) 12.5 MG tablet Take 12.5 mg by mouth every 6 (six) hours as needed. nausea 08/05/14   Historical Provider, MD  traMADol (ULTRAM) 50 MG  tablet Take 1 tablet (50 mg total) by mouth every 6 (six) hours as needed. 02/15/14   Mariea Clonts, MD   BP 123/94 mmHg  Pulse 72  Temp(Src) 98.7 F (37.1 C) (Oral)  Resp 13  Ht 5\' 4"  (1.626 m)  Wt 125 lb (56.7 kg)  BMI 21.45 kg/m2  SpO2 99% Physical Exam  Constitutional: She is oriented to person, place, and time. She appears well-developed.  HENT:  Head: Normocephalic.  Eyes: Conjunctivae and EOM are normal. No scleral icterus.  Neck: Neck supple. No thyromegaly present.  Cardiovascular: Normal  rate and regular rhythm.  Exam reveals no gallop and no friction rub.   No murmur heard. Pulmonary/Chest: No stridor. She has no wheezes. She has no rales. She exhibits no tenderness.  Abdominal: She exhibits no distension. There is no tenderness. There is no rebound.  Musculoskeletal: Normal range of motion. She exhibits no edema.  Lymphadenopathy:    She has no cervical adenopathy.  Neurological: She is oriented to person, place, and time. She exhibits normal muscle tone. Coordination normal.  Pt unable to speak  Skin: No rash noted. No erythema.  Psychiatric: She has a normal mood and affect. Her behavior is normal.    ED Course  Procedures (including critical care time) Labs Review Labs Reviewed  CBC WITH DIFFERENTIAL - Abnormal; Notable for the following:    Hemoglobin 11.8 (*)    HCT 34.4 (*)    All other components within normal limits  COMPREHENSIVE METABOLIC PANEL - Abnormal; Notable for the following:    Potassium 3.4 (*)    Glucose, Bld 111 (*)    All other components within normal limits    Imaging Review Ct Head Wo Contrast  09/15/2014   CLINICAL DATA:  Altered mental status.  Confusion.  EXAM: CT HEAD WITHOUT CONTRAST  TECHNIQUE: Contiguous axial images were obtained from the base of the skull through the vertex without intravenous contrast.  COMPARISON:  Brain MRI 09/11/2014  FINDINGS: No acute intracranial hemorrhage. No focal mass lesion. No CT evidence of acute infarction. No midline shift or mass effect. No hydrocephalus. Basilar cisterns are patent. Paranasal sinuses and mastoid air cells are clear.  IMPRESSION: Normal head CT.  No change from MRI 4  days prior.   Electronically Signed   By: Suzy Bouchard M.D.   On: 09/15/2014 19:19     EKG Interpretation None      MDM   Final diagnoses:  Pain    Pt seen for same quote sz and aphasia last week,  Nl eeg,  Nl mri and ct,  Dx conversion reaction.      Pt with same conversion reaction now.   pysc to see  pt  Will transfer to [facility] for psychiatrist to see pt in the am  Maudry Diego, MD 09/15/14 2103  Maudry Diego, MD 09/15/14 2156

## 2014-09-15 NOTE — ED Notes (Signed)
TTS online assessment at this time.

## 2014-09-15 NOTE — BH Assessment (Signed)
Assessment completed. Consulted Serena Colonel, NP who recommended that pt be transferred to [facility] ED and re-evaluated by psychiatry in the am. Dr. Roderic Palau has been informed of the recommendation.

## 2014-09-15 NOTE — ED Notes (Signed)
Patient answers questions with motions
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
## 2014-09-15 NOTE — ED Notes (Signed)
Patient transported via [facility] transportation at this time to [facility]

## 2014-09-16 DIAGNOSIS — R45851 Suicidal ideations: Secondary | ICD-10-CM | POA: Insufficient documentation

## 2014-09-16 DIAGNOSIS — F449 Dissociative and conversion disorder, unspecified: Secondary | ICD-10-CM | POA: Insufficient documentation

## 2014-09-16 DIAGNOSIS — F329 Major depressive disorder, single episode, unspecified: Secondary | ICD-10-CM | POA: Insufficient documentation

## 2014-09-16 DIAGNOSIS — F419 Anxiety disorder, unspecified: Secondary | ICD-10-CM

## 2014-09-16 DIAGNOSIS — F32A Depression, unspecified: Secondary | ICD-10-CM | POA: Insufficient documentation

## 2014-09-16 MED ORDER — HYDROXYZINE PAMOATE 50 MG PO CAPS: 50.0000 mg | ORAL_CAPSULE | Freq: Three times a day (TID) | ORAL | Status: DC | PRN

## 2014-09-16 MED ORDER — FERROUS SULFATE 325 (65 FE) MG PO TBEC: 325.0000 mg | DELAYED_RELEASE_TABLET | Freq: Two times a day (BID) | ORAL | Status: DC

## 2014-09-16 MED ORDER — PHENAZOPYRIDINE HCL 200 MG PO TABS: 200.0000 mg | ORAL_TABLET | Freq: Three times a day (TID) | ORAL | Status: DC

## 2014-09-16 MED ORDER — IBUPROFEN 200 MG PO TABS: 200.0000 mg | ORAL_TABLET | Freq: Four times a day (QID) | ORAL | Status: DC | PRN

## 2014-09-16 NOTE — ED Notes (Addendum)
Written dc instructions reviewed w/ patient.  Pt  Encouraged to follow up as directed and take her medications as instructed.  Pt verbalized understanding.  Pt ambulatory to dc window w/ [facility],belongings returned after leaving the area. Ride is here to pick pt up.

## 2014-09-16 NOTE — ED Notes (Signed)
CSW into see 

## 2014-09-16 NOTE — ED Notes (Signed)
Up on the phone 

## 2014-09-16 NOTE — ED Notes (Signed)
Patient denies pain, SI, AH/VH. Patient stated that her life has been in [place] since she was diagnosed with cancer few months ago. Patient stated that "if everyone will stay off my back I will be fine to deal with my problems. I really want to get my life back". Support and encouragement offered to patient. Encouraged to be compliant with the treatment plan. Meal and drinks offered. Safety maintained. Patient made comfortable. Will continue to monitor patient.

## 2014-09-16 NOTE — ED Notes (Signed)
Up to the bathroom to bath off

## 2014-09-16 NOTE — ED Notes (Signed)
Up to the bathroom 
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
## 2014-09-21 HISTORY — PX: PORTACATH PLACEMENT: SHX2246

## 2014-10-08 ENCOUNTER — Encounter (HOSPITAL_COMMUNITY): Payer: Medicaid Other | Attending: Hematology & Oncology | Admitting: Hematology & Oncology

## 2014-10-08 ENCOUNTER — Encounter (HOSPITAL_COMMUNITY): Payer: Self-pay | Admitting: Hematology & Oncology

## 2014-10-08 VITALS — BP 128/74 | HR 84 | Temp 98.9°F | Resp 18 | Ht 64.0 in | Wt 142.0 lb

## 2014-10-08 DIAGNOSIS — J45909 Unspecified asthma, uncomplicated: Secondary | ICD-10-CM | POA: Insufficient documentation

## 2014-10-08 DIAGNOSIS — D259 Leiomyoma of uterus, unspecified: Secondary | ICD-10-CM | POA: Insufficient documentation

## 2014-10-08 DIAGNOSIS — C775 Secondary and unspecified malignant neoplasm of intrapelvic lymph nodes: Secondary | ICD-10-CM | POA: Diagnosis not present

## 2014-10-08 DIAGNOSIS — C541 Malignant neoplasm of endometrium: Secondary | ICD-10-CM | POA: Insufficient documentation

## 2014-10-08 DIAGNOSIS — J449 Chronic obstructive pulmonary disease, unspecified: Secondary | ICD-10-CM | POA: Insufficient documentation

## 2014-10-08 MED ORDER — DEXAMETHASONE 4 MG PO TABS
ORAL_TABLET | ORAL | Status: DC
Start: 2014-10-08 — End: 2014-12-28

## 2014-10-08 MED ORDER — TRAMADOL HCL 50 MG PO TABS: 50.0000 mg | ORAL_TABLET | Freq: Four times a day (QID) | ORAL | Status: DC | PRN

## 2014-10-08 NOTE — Progress Notes (Signed)
[facility] CONSULT NOTE  No care team member to display  CHIEF COMPLAINTS/PURPOSE OF CONSULTATION:  Stage IV Endometrial Cancer, probable pulmonary metastases. Radical hysterectomy, BSO, pelvic LN dissection and vaginal biopsies 07/31/2014, patient had extensive pelvic disease including positive LN and positive distal vaginal biopsy  CA-125 137.41 on 07/19/2014 CA-125 25.72 on 09/27/2014  (normal range 0-20.9U/ml)  HISTORY OF PRESENTING ILLNESS:   Julia Osborne 54 y.o. female is here because of stage IV endometrial cancer.  She had her hysterectomy in November. She started treatement with Carboplatin/Taxol at the beginning of January. She desires to transfer her care   She developed severe vaginal bleeding with clots, about 6 months prior to her diagnosis. She went to see Dr. Micah Noel in [facility]. Biopsies were performed and she was then referred to [facility].   She goes to [facility] for counseling. Her counselor wants to start her on an antidepressant.  She smokes more when she has anxiety.   She takes ibuprofen 800mg  for pain, maybe once a day.  That helps her the most. She says roxicodone makes her itch and feel "funny in her head." She also gets too constipated.     Endometrial cancer   07/12/2014 Imaging CT C/A/P, pelvic adenopathy, multiple pulmonary nodules concerning for metastatic disease   07/30/2014 Initial Diagnosis Endometrial cancer   07/31/2014 Pathology Results endometrioid adenocarcinoma, G2, lymphovascular invasion present, positive pelvic lymph node and vaginal biopsy   07/31/2014 Definitive Surgery radical hysterectomy, BSO, pelvic lymph node dissection and vaginal biopsies   09/25/2014 Procedure Port-A-Cath placement in IR     MEDICAL HISTORY:  Past Medical History  Diagnosis Date  . Asthma   . Uterine fibroid   . Cancer     endometrial  . COPD (chronic obstructive pulmonary disease)     no definite diagnosis    SURGICAL HISTORY: Past  Surgical History  Procedure Laterality Date  . Tubal ligation    . Appendectomy    . Abdominal hysterectomy      SOCIAL HISTORY: History   Social History  . Marital Status: Single    Spouse Name: N/A    Number of Children: N/A  . Years of Education: N/A   Occupational History  . Not on file.   Social History Main Topics  . Smoking status: Current Every Day Smoker -- 0.50 packs/day    Types: Cigarettes  . Smokeless tobacco: Not on file  . Alcohol Use: Yes  . Drug Use: No  . Sexual Activity: Not on file   Other Topics Concern  . Not on file   Social History Narrative  She was working, but now has applied for SSI and disability. She is now living with her niece. 2 children. 3 grandchildren, one is deceased. Divorced. Smoker 1/2 ppd. Trying to cut back.  Rare alcohol use.   FAMILY HISTORY: Family History  Problem Relation Age of Onset  . Stroke Mother   . Heart attack Father    indicated that her mother is deceased. She indicated that her father is deceased. She indicated that her sister is alive. She indicated that her brother is alive.    Mother deceased at 66 from embolus Father deceased at 34 from MI 2 sisters and 2 brothers, Oldest sister had nephrectomy for RCC.   ALLERGIES:  is allergic to codeine.  MEDICATIONS:  Current Outpatient Prescriptions  Medication Sig Dispense Refill  . albuterol (PROVENTIL HFA;VENTOLIN HFA) 108 (90 BASE) MCG/ACT inhaler Inhale 2 puffs into  the lungs every 2 (two) hours as needed for wheezing or shortness of breath (cough). (Patient taking differently: Inhale 1 puff into the lungs every 6 (six) hours as needed for wheezing or shortness of breath (cough). ) 1 Inhaler 0  . ferrous sulfate 325 (65 FE) MG EC tablet Take 1 tablet (325 mg total) by mouth 2 (two) times daily.    Marland Kitchen ibuprofen (ADVIL,MOTRIN) 200 MG tablet Take 1 tablet (200 mg total) by mouth every 6 (six) hours as needed for mild pain. 30 tablet   . oxyCODONE-acetaminophen  (PERCOCET/ROXICET) 5-325 MG per tablet Take 1-2 tablets by mouth every 6 (six) hours as needed for moderate pain or severe pain.    . phenazopyridine (PYRIDIUM) 95 MG tablet Take 95 mg by mouth 3 (three) times daily as needed for pain.    . promethazine (PHENERGAN) 12.5 MG tablet Take 12.5 mg by mouth every 6 (six) hours as needed. nausea    . dexamethasone (DECADRON) 4 MG tablet The day before chemo, take 20mg  (5 tablets) in the am and 20mg  (5 tablets) in the pm. Take with food. 60 tablet 0  . hydrOXYzine (VISTARIL) 50 MG capsule Take 1 capsule (50 mg total) by mouth 3 (three) times daily as needed for anxiety. (Patient not taking: Reported on 10/08/2014) 30 capsule 0  . phenazopyridine (PYRIDIUM) 200 MG tablet Take 1 tablet (200 mg total) by mouth 3 (three) times daily. (Patient not taking: Reported on 10/08/2014) 10 tablet   . traMADol (ULTRAM) 50 MG tablet Take 1 tablet (50 mg total) by mouth every 6 (six) hours as needed. 30 tablet 0   No current facility-administered medications for this visit.    Review of Systems  Constitutional: Negative for fever, chills, weight loss, malaise/fatigue and diaphoresis.  HENT: Negative for congestion, ear discharge, ear pain, hearing loss, nosebleeds, sore throat and tinnitus.   Eyes: Positive for blurred vision. Negative for double vision, photophobia, pain, discharge and redness.       Needs new glasses  Respiratory: Negative.  Negative for stridor.   Cardiovascular: Negative.   Gastrointestinal: Positive for heartburn. Negative for nausea, vomiting, abdominal pain, diarrhea, constipation, blood in stool and melena.  Genitourinary: Negative for dysuria, hematuria and flank pain.       Has to use pyridium for burning, chronic  Musculoskeletal: Positive for myalgias, back pain, joint pain and neck pain. Negative for falls.  Skin: Negative.   Neurological: Positive for headaches. Negative for dizziness, tingling, tremors, sensory change, speech change,  focal weakness, seizures, loss of consciousness and weakness.  Endo/Heme/Allergies: Negative.   Psychiatric/Behavioral: Positive for depression and suicidal ideas. Negative for hallucinations, memory loss and substance abuse. The patient is nervous/anxious. The patient does not have insomnia.     PHYSICAL EXAMINATION:  ECOG PERFORMANCE STATUS: 0 - Asymptomatic  Filed Vitals:   10/08/14 1517  BP: 128/74  Pulse: 84  Temp: 98.9 F (37.2 C)  Resp: 18   Filed Weights   10/08/14 1517  Weight: 142 lb (64.411 kg)     Physical Exam  Constitutional: She is oriented to person, place, and time and well-developed, well-nourished, and in no distress.  HENT:  Head: Normocephalic and atraumatic.  Nose: Nose normal.  Mouth/Throat: Oropharynx is clear and moist. No oropharyngeal exudate.  Eyes: Conjunctivae and EOM are normal. Pupils are equal, round, and reactive to light. Right eye exhibits no discharge. Left eye exhibits no discharge. No scleral icterus.  Neck: Normal range of motion. Neck supple. No tracheal  deviation present. No thyromegaly present.  Cardiovascular: Normal rate, regular rhythm and normal heart sounds.  Exam reveals no gallop and no friction rub.   No murmur heard. Pulmonary/Chest: Effort normal and breath sounds normal. She has no wheezes. She has no rales.  Abdominal: Soft. Bowel sounds are normal. She exhibits no distension and no mass. There is no tenderness. There is no rebound and no guarding.  Musculoskeletal: Normal range of motion. She exhibits no edema.  Lymphadenopathy:    She has no cervical adenopathy.  Neurological: She is alert and oriented to person, place, and time. She has normal reflexes. No cranial nerve deficit. Gait normal. Coordination normal.  Skin: Skin is warm and dry. No rash noted.  Psychiatric: Mood, memory, affect and judgment normal.  Nursing note and vitals reviewed.    LABORATORY DATA:  I have reviewed the data as listed Lab Results   Component Value Date   WBC 7.6 09/15/2014   HGB 11.8* 09/15/2014   HCT 34.4* 09/15/2014   MCV 83.9 09/15/2014   PLT 355 09/15/2014     Chemistry      Component Value Date/Time   NA 139 09/15/2014 1827   K 3.4* 09/15/2014 1827   CL 110 09/15/2014 1827   CO2 21 09/15/2014 1827   BUN 9 09/15/2014 1827   CREATININE 0.73 09/15/2014 1827      Component Value Date/Time   CALCIUM 9.3 09/15/2014 1827   ALKPHOS 68 09/15/2014 1827   AST 16 09/15/2014 1827   ALT 22 09/15/2014 1827   BILITOT 0.3 09/15/2014 1827       RADIOGRAPHIC STUDIES: I have personally reviewed the radiological images as listed and agreed with the findings in the report. CT C/A/P from [facility] in October 2015   ASSESSMENT & PLAN:  Endometrial cancer Pleasant 54 year old female diagnosed with endometrial cancer. Based on imaging studies, stage IV disease. CEA 125 prior to the initiation of carboplatin and taxol was only mildly elevated. We will continue to follow her tumor marker. She will be due for cycle 2 of carboplatin and Taxol on the 28th. We will arrange for this. She currently has limited resources but is very interested in some of the programs at the [facility]. I will have her meet with Lavella Hammock as well.  She has difficulty tolerating pain medication secondary to "itching". I will try her on tramadol as needed.  His issues with depression and anxiety and is working with [employer]. There was some question as to whether or not she could start an antidepressant and I have advised her that there are no contraindications for her to start a new medication.  We will plan on reimaging her after 3 cycles of carboplatin and Taxol. I answered her questions and her nieces questions. I had them meet with [facility] as well for another point of contact. We will see her back on the 28th with laboratory studies and proceed with her next cycle of chemotherapy.    Orders Placed This Encounter  Procedures  .  CBC with Differential    Standing Status: Standing     Number of Occurrences: 6     Standing Expiration Date: 10/09/2015  . Comprehensive metabolic panel    Standing Status: Standing     Number of Occurrences: 6     Standing Expiration Date: 10/09/2015  . CA 125    Standing Status: Standing     Number of Occurrences: 6     Standing Expiration Date: 10/09/2015  .  Ambulatory referral to Social Work    Referral Priority:  Routine    Referral Type:  Consultation    Referral Reason:  Specialty Services Required    Number of Visits Requested:  1    All questions were answered. The patient knows to call the clinic with any problems, questions or concerns.   Molli Hazard, MD MD 10/09/2014 8:11 AM

## 2014-10-08 NOTE — Progress Notes (Signed)
Chemo consent obtained for Carbo/Taxol and distress screening done.

## 2014-10-08 NOTE — Patient Instructions (Signed)
[place] at [facility]  Discharge Instructions:  We will see you back on the 28th for your next cycle of chemotherapy. I will give you a prescription for tramadol to take for pain. Please call if you have any questions prior to your next follow-up. _______________________________________________________________  Thank you for choosing [facility] at [facility] to provide your oncology and hematology care.  To afford each patient quality time with our providers, please arrive at least 15 minutes before your scheduled appointment.  You need to re-schedule your appointment if you arrive 10 or more minutes late.  We strive to give you quality time with our providers, and arriving late affects you and other patients whose appointments are after yours.  Also, if you no show three or more times for appointments you may be dismissed from the clinic.  Again, thank you for choosing [facility] at [place] hope is that these requests will allow you access to exceptional care and in a timely manner. _______________________________________________________________  If you have questions after your visit, please contact our office at [phone] between the hours of 8:30 a.m. and 5:00 p.m. Voicemails left after 4:30 p.m. will not be returned until the following business day. _______________________________________________________________  For prescription refill requests, have your pharmacy contact our office. _______________________________________________________________  Recommendations made by the consultant and any test results will be sent to your referring physician. _______________________________________________________________

## 2014-10-09 DIAGNOSIS — C541 Malignant neoplasm of endometrium: Secondary | ICD-10-CM | POA: Insufficient documentation

## 2014-10-09 NOTE — Assessment & Plan Note (Signed)
Pleasant 54 year old female diagnosed with endometrial cancer. Based on imaging studies, stage IV disease. CEA 125 prior to the initiation of carboplatin and taxol was only mildly elevated. We will continue to follow her tumor marker. She will be due for cycle 2 of carboplatin and Taxol on the 28th. We will arrange for this. She currently has limited resources but is very interested in some of the programs at the [facility]. I will have her meet with Lavella Hammock as well.  She has difficulty tolerating pain medication secondary to "itching". I will try her on tramadol as needed.  His issues with depression and anxiety and is working with [employer]. There was some question as to whether or not she could start an antidepressant and I have advised her that there are no contraindications for her to start a new medication.  We will plan on reimaging her after 3 cycles of carboplatin and Taxol. I answered her questions and her nieces questions. I had them meet with [facility] as well for another point of contact. We will see her back on the 28th with laboratory studies and proceed with her next cycle of chemotherapy.

## 2014-10-10 ENCOUNTER — Encounter: Payer: Self-pay | Admitting: *Deleted

## 2014-10-10 NOTE — Progress Notes (Signed)
[facility] Psychosocial Distress Screening Clinical Social Work  Clinical Social Work was referred by distress screening protocol.  The patient scored a 5 on the Psychosocial Distress Thermometer which indicates moderate distress. Clinical Social Worker phoned Julia Osborne to assess for distress and other psychosocial needs. CSW also reviewed chart. Julia Osborne reports she has applied for medicaid and ss disability, these are pending. She is living with her niece currently and just moved from [facility]. She is working on getting her food stamps moved from [employer]. She shared she is getting counseling 2x a month through [employer]. She feels her depression and anxiety is ok currently, but is aware this may change. She has been talking to her therapist at [facility] about this as well. She feels this has been very helpful to her and plans to continue.   CSW educated Julia Osborne on resources to assist, GYN Support Group, Duanne Limerick and others. She is very interested and plans to try to reach out/attend. CSW reviewed cancer related financial assistance options as well. CSW mailing Julia Osborne support calendar and list of resources. Julia Osborne aware and agrees to contact CSW as needed.  ONCBCN DISTRESS SCREENING 10/08/2014  Screening Type Initial Screening  Distress experienced in past week (1-10) 5  Practical problem type (No Data)  Family Problem type Other (comment)  Emotional problem type Adjusting to illness  Referral to clinical social work Yes    Clinical Social Worker follow up needed: Yes.    If yes, follow up plan:  See above Loren Racer, [place] Tuesdays 8:30-1pm Wednesdays 8:30-12pm  [phone]

## 2014-10-11 ENCOUNTER — Other Ambulatory Visit (HOSPITAL_COMMUNITY): Payer: Self-pay | Admitting: Hematology & Oncology

## 2014-10-18 ENCOUNTER — Encounter (HOSPITAL_COMMUNITY): Payer: Self-pay | Admitting: Hematology & Oncology

## 2014-10-18 ENCOUNTER — Ambulatory Visit (HOSPITAL_COMMUNITY): Payer: Self-pay | Admitting: Oncology

## 2014-10-18 ENCOUNTER — Encounter (HOSPITAL_BASED_OUTPATIENT_CLINIC_OR_DEPARTMENT_OTHER): Payer: Medicaid Other

## 2014-10-18 ENCOUNTER — Encounter (HOSPITAL_BASED_OUTPATIENT_CLINIC_OR_DEPARTMENT_OTHER): Payer: Medicaid Other | Admitting: Hematology & Oncology

## 2014-10-18 VITALS — BP 109/68 | HR 79 | Temp 98.5°F | Resp 16 | Wt 141.5 lb

## 2014-10-18 DIAGNOSIS — D259 Leiomyoma of uterus, unspecified: Secondary | ICD-10-CM | POA: Diagnosis not present

## 2014-10-18 DIAGNOSIS — J45909 Unspecified asthma, uncomplicated: Secondary | ICD-10-CM | POA: Diagnosis not present

## 2014-10-18 DIAGNOSIS — C541 Malignant neoplasm of endometrium: Secondary | ICD-10-CM

## 2014-10-18 DIAGNOSIS — J449 Chronic obstructive pulmonary disease, unspecified: Secondary | ICD-10-CM | POA: Diagnosis not present

## 2014-10-18 DIAGNOSIS — Z5111 Encounter for antineoplastic chemotherapy: Secondary | ICD-10-CM

## 2014-10-18 LAB — CBC WITH DIFFERENTIAL/PLATELET
BASOS ABS: 0 10*3/uL (ref 0.0–0.1)
Basophils Relative: 0 % (ref 0–1)
EOS ABS: 0 10*3/uL (ref 0.0–0.7)
Eosinophils Relative: 0 % (ref 0–5)
HCT: 31.1 % — ABNORMAL LOW (ref 36.0–46.0)
HEMOGLOBIN: 10 g/dL — AB (ref 12.0–15.0)
LYMPHS PCT: 41 % (ref 12–46)
Lymphs Abs: 2.3 10*3/uL (ref 0.7–4.0)
MCH: 29.2 pg (ref 26.0–34.0)
MCHC: 32.2 g/dL (ref 30.0–36.0)
MCV: 90.7 fL (ref 78.0–100.0)
Monocytes Absolute: 0.6 10*3/uL (ref 0.1–1.0)
Monocytes Relative: 11 % (ref 3–12)
NEUTROS ABS: 2.7 10*3/uL (ref 1.7–7.7)
Neutrophils Relative %: 48 % (ref 43–77)
PLATELETS: 190 10*3/uL (ref 150–400)
RBC: 3.43 MIL/uL — AB (ref 3.87–5.11)
RDW: 17 % — ABNORMAL HIGH (ref 11.5–15.5)
WBC: 5.7 10*3/uL (ref 4.0–10.5)

## 2014-10-18 LAB — COMPREHENSIVE METABOLIC PANEL
ALK PHOS: 106 U/L (ref 39–117)
ALT: 91 U/L — ABNORMAL HIGH (ref 0–35)
AST: 45 U/L — AB (ref 0–37)
Albumin: 3.8 g/dL (ref 3.5–5.2)
Anion gap: 6 (ref 5–15)
BUN: 15 mg/dL (ref 6–23)
CO2: 25 mmol/L (ref 19–32)
Calcium: 8.9 mg/dL (ref 8.4–10.5)
Chloride: 108 mmol/L (ref 96–112)
Creatinine, Ser: 0.72 mg/dL (ref 0.50–1.10)
GFR calc Af Amer: >90 mL/min (ref >90)
GFR calc non Af Amer: >90 mL/min (ref >90)
Glucose, Bld: 90 mg/dL (ref 70–99)
Potassium: 3.8 mmol/L (ref 3.5–5.1)
Sodium: 139 mmol/L (ref 135–145)
Total Bilirubin: 0.4 mg/dL (ref 0.3–1.2)
Total Protein: 6.9 g/dL (ref 6.0–8.3)

## 2014-10-18 MED ORDER — DIPHENHYDRAMINE HCL 50 MG/ML IJ SOLN
INTRAMUSCULAR | Status: AC
  Filled 2014-10-18: qty 1

## 2014-10-18 MED ORDER — SODIUM CHLORIDE 0.9 % IV SOLN
16.0000 mg | Freq: Once | INTRAVENOUS | Status: AC
  Administered 2014-10-18: 16 mg via INTRAVENOUS
  Filled 2014-10-18: qty 8

## 2014-10-18 MED ORDER — DIPHENHYDRAMINE HCL 50 MG/ML IJ SOLN
50.0000 mg | Freq: Once | INTRAMUSCULAR | Status: AC
  Administered 2014-10-18: 50 mg via INTRAVENOUS

## 2014-10-18 MED ORDER — FAMOTIDINE IN NACL 20-0.9 MG/50ML-% IV SOLN
INTRAVENOUS | Status: AC
  Filled 2014-10-18: qty 50

## 2014-10-18 MED ORDER — SODIUM CHLORIDE 0.9 % IV SOLN
Freq: Once | INTRAVENOUS | Status: AC
  Administered 2014-10-18: 09:00:00 via INTRAVENOUS

## 2014-10-18 MED ORDER — SODIUM CHLORIDE 0.9 % IV SOLN
548.0000 mg | Freq: Once | INTRAVENOUS | Status: AC
  Administered 2014-10-18: 550 mg via INTRAVENOUS
  Filled 2014-10-18: qty 55

## 2014-10-18 MED ORDER — FAMOTIDINE IN NACL 20-0.9 MG/50ML-% IV SOLN
20.0000 mg | Freq: Once | INTRAVENOUS | Status: AC
  Administered 2014-10-18: 20 mg via INTRAVENOUS
  Filled 2014-10-18: qty 50

## 2014-10-18 MED ORDER — DEXTROSE 5 % IV SOLN
175.0000 mg/m2 | Freq: Once | INTRAVENOUS | Status: AC
  Administered 2014-10-18: 300 mg via INTRAVENOUS
  Filled 2014-10-18: qty 50

## 2014-10-18 MED ORDER — ONDANSETRON HCL 8 MG PO TABS: 8.0000 mg | ORAL_TABLET | Freq: Three times a day (TID) | ORAL | Status: DC | PRN

## 2014-10-18 MED ORDER — DEXAMETHASONE SODIUM PHOSPHATE 10 MG/ML IJ SOLN: 20.0000 mg | Freq: Once | INTRAMUSCULAR | Status: DC

## 2014-10-18 MED ORDER — HEPARIN SOD (PORK) LOCK FLUSH 100 UNIT/ML IV SOLN
500.0000 [IU] | Freq: Once | INTRAVENOUS | Status: AC | PRN
  Administered 2014-10-18: 500 [IU]

## 2014-10-18 MED ORDER — SODIUM CHLORIDE 0.9 % IV SOLN
20.0000 mg | Freq: Once | INTRAVENOUS | Status: AC
  Administered 2014-10-18: 20 mg via INTRAVENOUS
  Filled 2014-10-18: qty 2

## 2014-10-18 MED ORDER — SODIUM CHLORIDE 0.9 % IJ SOLN: 10.0000 mL | INTRAMUSCULAR | Status: DC | PRN

## 2014-10-18 NOTE — Progress Notes (Signed)
[facility] CONSULT NOTE  No care team member to display  CHIEF COMPLAINTS/PURPOSE OF CONSULTATION:  Stage IV Endometrial Cancer, probable pulmonary metastases. Radical hysterectomy, BSO, pelvic LN dissection and vaginal biopsies 07/31/2014, patient had extensive pelvic disease including positive LN and positive distal vaginal biopsy  CA-125 137.41 on 07/19/2014 CA-125 25.72 on 09/27/2014  (normal range 0-20.9U/ml)  HISTORY OF PRESENTING ILLNESS:   Julia Osborne 54 y.o. female is here because of stage IV endometrial cancer.  She is here today to receive cycle 2 of carboplatin and Taxol. She did not get her medications filled and has not taken her dexamethasone. Her Medicaid is still not active. She is having to pay for all of her medications out of pocket. She is requesting more Zofran and is asking if we can help her find a pharmacy with the least expensive medication for her.  She has no other complaints she is ready for treatment she said for the most part other than episodes of nausea she has been eating well.    Endometrial cancer   07/12/2014 Imaging CT C/A/P, pelvic adenopathy, multiple pulmonary nodules concerning for metastatic disease   07/30/2014 Initial Diagnosis Endometrial cancer   07/31/2014 Pathology Results endometrioid adenocarcinoma, G2, lymphovascular invasion present, positive pelvic lymph node and vaginal biopsy   07/31/2014 Definitive Surgery radical hysterectomy, BSO, pelvic lymph node dissection and vaginal biopsies   09/25/2014 Procedure Port-A-Cath placement in IR     MEDICAL HISTORY:  Past Medical History  Diagnosis Date  . Asthma   . Uterine fibroid   . Cancer     endometrial  . COPD (chronic obstructive pulmonary disease)     no definite diagnosis    SURGICAL HISTORY: Past Surgical History  Procedure Laterality Date  . Tubal ligation    . Appendectomy    . Abdominal hysterectomy    . Portacath placement Right 09/2014    SOCIAL  HISTORY: History   Social History  . Marital Status: Single    Spouse Name: N/A    Number of Children: N/A  . Years of Education: N/A   Occupational History  . Not on file.   Social History Main Topics  . Smoking status: Current Every Day Smoker -- 0.50 packs/day    Types: Cigarettes  . Smokeless tobacco: Not on file  . Alcohol Use: Yes  . Drug Use: No  . Sexual Activity: Not on file   Other Topics Concern  . Not on file   Social History Narrative  She was working, but now has applied for SSI and disability. She is now living with her niece. 2 children. 3 grandchildren, one is deceased. Divorced. Smoker 1/2 ppd. Trying to cut back.  Rare alcohol use.   FAMILY HISTORY: Family History  Problem Relation Age of Onset  . Stroke Mother   . Heart attack Father    indicated that her mother is deceased. She indicated that her father is deceased. She indicated that her sister is alive. She indicated that her brother is alive.    Mother deceased at 69 from embolus Father deceased at 23 from MI 2 sisters and 2 brothers, Oldest sister had nephrectomy for RCC.   ALLERGIES:  is allergic to codeine.  MEDICATIONS:  Current Outpatient Prescriptions  Medication Sig Dispense Refill  . albuterol (PROVENTIL HFA;VENTOLIN HFA) 108 (90 BASE) MCG/ACT inhaler Inhale 2 puffs into the lungs every 2 (two) hours as needed for wheezing or shortness of breath (cough). (Patient taking differently: Inhale 1  puff into the lungs every 6 (six) hours as needed for wheezing or shortness of breath (cough). ) 1 Inhaler 0  . ferrous sulfate 325 (65 FE) MG EC tablet Take 1 tablet (325 mg total) by mouth 2 (two) times daily.    . phenazopyridine (PYRIDIUM) 95 MG tablet Take 95 mg by mouth 3 (three) times daily as needed for pain.    . promethazine (PHENERGAN) 12.5 MG tablet Take 12.5 mg by mouth every 6 (six) hours as needed. nausea    . traMADol (ULTRAM) 50 MG tablet Take 1 tablet (50 mg total) by mouth every 6  (six) hours as needed. 30 tablet 0  . dexamethasone (DECADRON) 4 MG tablet The day before chemo, take 20mg  (5 tablets) in the am and 20mg  (5 tablets) in the pm. Take with food. (Patient not taking: Reported on 10/18/2014) 60 tablet 0  . hydrOXYzine (VISTARIL) 50 MG capsule Take 1 capsule (50 mg total) by mouth 3 (three) times daily as needed for anxiety. (Patient not taking: Reported on 10/08/2014) 30 capsule 0  . ibuprofen (ADVIL,MOTRIN) 200 MG tablet Take 1 tablet (200 mg total) by mouth every 6 (six) hours as needed for mild pain. (Patient not taking: Reported on 10/18/2014) 30 tablet   . ondansetron (ZOFRAN) 8 MG tablet Take 1 tablet (8 mg total) by mouth every 8 (eight) hours as needed for nausea or vomiting. 30 tablet 2  . oxyCODONE-acetaminophen (PERCOCET/ROXICET) 5-325 MG per tablet Take 1-2 tablets by mouth every 6 (six) hours as needed for moderate pain or severe pain.    . phenazopyridine (PYRIDIUM) 200 MG tablet Take 1 tablet (200 mg total) by mouth 3 (three) times daily. (Patient not taking: Reported on 10/18/2014) 10 tablet    No current facility-administered medications for this visit.   Facility-Administered Medications Ordered in Other Visits  Medication Dose Route Frequency Provider Last Rate Last Dose  . sodium chloride 0.9 % injection 10 mL  10 mL Intracatheter PRN Molli Hazard, MD        Review of Systems  Constitutional: Negative for fever, chills, weight loss and malaise/fatigue.  HENT: Negative for congestion, hearing loss, nosebleeds, sore throat and tinnitus.   Eyes: Negative for blurred vision, double vision, pain and discharge.  Respiratory: Negative for cough, hemoptysis, sputum production, shortness of breath and wheezing.   Cardiovascular: Negative for chest pain, palpitations, claudication, leg swelling and PND.  Gastrointestinal: Negative for heartburn, nausea, vomiting, abdominal pain, diarrhea, constipation, blood in stool and melena.  Genitourinary:  Negative for dysuria, urgency, frequency and hematuria.  Musculoskeletal: Negative for myalgias, joint pain and falls.  Skin: Negative for itching and rash.  Neurological: Negative for dizziness, tingling, tremors, sensory change, speech change, focal weakness, seizures, loss of consciousness, weakness and headaches.  Endo/Heme/Allergies: Does not bruise/bleed easily.  Psychiatric/Behavioral: Positive for depression. Negative for suicidal ideas, memory loss and substance abuse. The patient is not nervous/anxious and does not have insomnia.     PHYSICAL EXAMINATION:  ECOG PERFORMANCE STATUS: 0 - Asymptomatic  Filed Vitals:   10/18/14 0856  BP: 109/68  Pulse: 79  Temp: 98.5 F (36.9 C)  Resp: 16   Filed Weights   10/18/14 0856  Weight: 141 lb 8 oz (64.184 kg)     Physical Exam  Constitutional: She is oriented to person, place, and time and well-developed, well-nourished, and in no distress.  HENT:  Head: Normocephalic and atraumatic.  Nose: Nose normal.  Mouth/Throat: Oropharynx is clear and moist. No oropharyngeal exudate.  Eyes: Conjunctivae and EOM are normal. Pupils are equal, round, and reactive to light. Right eye exhibits no discharge. Left eye exhibits no discharge. No scleral icterus.  Neck: Normal range of motion. Neck supple. No tracheal deviation present. No thyromegaly present.  Cardiovascular: Normal rate, regular rhythm and normal heart sounds.  Exam reveals no gallop and no friction rub.   No murmur heard. Pulmonary/Chest: Effort normal and breath sounds normal. She has no wheezes. She has no rales.  Abdominal: Soft. Bowel sounds are normal. She exhibits no distension and no mass. There is no tenderness. There is no rebound and no guarding.  Well healed abdominal surgical incision site  Musculoskeletal: Normal range of motion. She exhibits no edema.  Lymphadenopathy:    She has no cervical adenopathy.  Neurological: She is alert and oriented to person, place,  and time. She has normal reflexes. No cranial nerve deficit. Gait normal. Coordination normal.  Skin: Skin is warm and dry. No rash noted.  Psychiatric: Mood, memory, affect and judgment normal.  Nursing note and vitals reviewed.    LABORATORY DATA:  I have reviewed the data as listed Lab Results  Component Value Date   WBC 5.7 10/18/2014   HGB 10.0* 10/18/2014   HCT 31.1* 10/18/2014   MCV 90.7 10/18/2014   PLT 190 10/18/2014     Chemistry      Component Value Date/Time   NA 139 10/18/2014 0901   K 3.8 10/18/2014 0901   CL 108 10/18/2014 0901   CO2 25 10/18/2014 0901   BUN 15 10/18/2014 0901   CREATININE 0.72 10/18/2014 0901      Component Value Date/Time   CALCIUM 8.9 10/18/2014 0901   ALKPHOS 106 10/18/2014 0901   AST 45* 10/18/2014 0901   ALT 91* 10/18/2014 0901   BILITOT 0.4 10/18/2014 0901       ASSESSMENT & PLAN:  Endometrial cancer Pleasant 54 year old female with stage IV endometrial carcinoma. She is here today for cycle #2 of carboplatin and Taxol. Her mood is good today, and she seems to be doing quite well. We were able to help her obtain Zofran[facility]. We gave her dexamethasone IV during her office visit, and I encouraged her to make sure she has her dexamethasone prior to her next cycle of chemotherapy to avoid potential Taxol reaction. I advised her she is unable to obtain the medication to please let us know and we will see what we can do to help.  I advised her to let us know if she has any difficulties or problems prior to her next follow-up. We again reviewed side effects and symptoms of concern such as nausea, vomiting, diarrhea, fever, chills, or inability to eat. Plan will be return to clinic again in 3 weeks with repeat laboratory studies and cycle #3 of carboplatin and Taxol. We will plan on repeat imaging after cycle 3.    No orders of the defined types were placed in this encounter.    All questions were  answered. The patient knows to call the clinic with any problems, questions or concerns.   Molli Hazard, MD MD 10/18/2014 5:47 PM

## 2014-10-18 NOTE — Patient Instructions (Signed)
[place] at [facility]  Discharge Instructions:  Please call with any problems or concerns prior to your next visit. We will plan on seeing you back in 3 weeks prior to your next chemotherapy, but if you have problems please let us know [facility] has your script for zofran for $19 _______________________________________________________________  Thank you for choosing [place] at [facility] to provide your oncology and hematology care.  To afford each patient quality time with our providers, please arrive at least 15 minutes before your scheduled appointment.  You need to re-schedule your appointment if you arrive 10 or more minutes late.  We strive to give you quality time with our providers, and arriving late affects you and other patients whose appointments are after yours.  Also, if you no show three or more times for appointments you may be dismissed from the clinic.  Again, thank you for choosing [place] at [facility] [place] hope is that these requests will allow you access to exceptional care and in a timely manner. _______________________________________________________________  If you have questions after your visit, please contact our office at [phone] between the hours of 8:30 a.m. and 5:00 p.m. Voicemails left after 4:30 p.m. will not be returned until the following business day. _______________________________________________________________  For prescription refill requests, have your pharmacy contact our office. _______________________________________________________________  Recommendations made by the consultant and any test results will be sent to your referring physician. _______________________________________________________________

## 2014-10-18 NOTE — Patient Instructions (Signed)
[facility] Discharge Instructions for Patients Receiving Chemotherapy  Today you received the following chemotherapy agents Taxol and Carboplatin.  If you develop nausea and vomiting, or diarrhea that is not controlled by your medication, call the clinic.  The clinic phone number is [phone]. Office hours are Monday-Friday 8:30am-5:00pm.  BELOW ARE SYMPTOMS THAT SHOULD BE REPORTED IMMEDIATELY:  *FEVER GREATER THAN 101.0 F  *CHILLS WITH OR WITHOUT FEVER  NAUSEA AND VOMITING THAT IS NOT CONTROLLED WITH YOUR NAUSEA MEDICATION  *UNUSUAL SHORTNESS OF BREATH  *UNUSUAL BRUISING OR BLEEDING  TENDERNESS IN MOUTH AND THROAT WITH OR WITHOUT PRESENCE OF ULCERS  *URINARY PROBLEMS  *BOWEL PROBLEMS  UNUSUAL RASH Items with * indicate a potential emergency and should be followed up as soon as possible. If you have an emergency after office hours please contact your primary care physician or go to the nearest emergency department.  Please call the clinic during office hours if you have any questions or concerns.   You may also contact the Patient Navigator at [phone] should you have any questions or need assistance in obtaining follow up care. _____________________________________________________________________ Have you asked about our STAR program?    STAR stands for Survivorship Training and Rehabilitation, and this is a nationally recognized cancer care program that focuses on survivorship and rehabilitation.  Cancer and cancer treatments may cause problems, such as, pain, making you feel tired and keeping you from doing the things that you need or want to do. Cancer rehabilitation can help. Our goal is to reduce these troubling effects and help you have the best quality of life possible.  You may receive a survey from a nurse that asks questions about your current state of health.  Based on the survey results, all eligible patients will be referred to the [facility]  program for an evaluation so we can better serve you! A frequently asked questions sheet is available upon request.           Carboplatin injection What is this medicine? CARBOPLATIN (KAR boe pla tin) is a chemotherapy drug. It targets fast dividing cells, like cancer cells, and causes these cells to die. This medicine is used to treat ovarian cancer and many other cancers. This medicine may be used for other purposes; ask your health care provider or pharmacist if you have questions. COMMON BRAND NAME(S): Paraplatin What should I tell my health care provider before I take this medicine? They need to know if you have any of these conditions: -blood disorders -hearing problems -kidney disease -recent or ongoing radiation therapy -an unusual or allergic reaction to carboplatin, cisplatin, other chemotherapy, other medicines, foods, dyes, or preservatives -pregnant or trying to get pregnant -breast-feeding How should I use this medicine? This drug is usually given as an infusion into a vein. It is administered in a hospital or clinic by a specially trained health care professional. Talk to your pediatrician regarding the use of this medicine in children. Special care may be needed. Overdosage: If you think you have taken too much of this medicine contact a poison control center or emergency room at once. NOTE: This medicine is only for you. Do not share this medicine with others. What if I miss a dose? It is important not to miss a dose. Call your doctor or health care professional if you are unable to keep an appointment. What may interact with this medicine? -medicines for seizures -medicines to increase blood counts like filgrastim, pegfilgrastim, sargramostim -some antibiotics like amikacin, gentamicin,  neomycin, streptomycin, tobramycin -vaccines Talk to your doctor or health care professional before taking any of these  medicines: -acetaminophen -aspirin -ibuprofen -ketoprofen -naproxen This list may not describe all possible interactions. Give your health care provider a list of all the medicines, herbs, non-prescription drugs, or dietary supplements you use. Also tell them if you smoke, drink alcohol, or use illegal drugs. Some items may interact with your medicine. What should I watch for while using this medicine? Your condition will be monitored carefully while you are receiving this medicine. You will need important blood work done while you are taking this medicine. This drug may make you feel generally unwell. This is not uncommon, as chemotherapy can affect healthy cells as well as cancer cells. Report any side effects. Continue your course of treatment even though you feel ill unless your doctor tells you to stop. In some cases, you may be given additional medicines to help with side effects. Follow all directions for their use. Call your doctor or health care professional for advice if you get a fever, chills or sore throat, or other symptoms of a cold or flu. Do not treat yourself. This drug decreases your body's ability to fight infections. Try to avoid being around people who are sick. This medicine may increase your risk to bruise or bleed. Call your doctor or health care professional if you notice any unusual bleeding. Be careful brushing and flossing your teeth or using a toothpick because you may get an infection or bleed more easily. If you have any dental work done, tell your dentist you are receiving this medicine. Avoid taking products that contain aspirin, acetaminophen, ibuprofen, naproxen, or ketoprofen unless instructed by your doctor. These medicines may hide a fever. Do not become pregnant while taking this medicine. Women should inform their doctor if they wish to become pregnant or think they might be pregnant. There is a potential for serious side effects to an unborn child. Talk to  your health care professional or pharmacist for more information. Do not breast-feed an infant while taking this medicine. What side effects may I notice from receiving this medicine? Side effects that you should report to your doctor or health care professional as soon as possible: -allergic reactions like skin rash, itching or hives, swelling of the face, lips, or tongue -signs of infection - fever or chills, cough, sore throat, pain or difficulty passing urine -signs of decreased platelets or bleeding - bruising, pinpoint red spots on the skin, black, tarry stools, nosebleeds -signs of decreased red blood cells - unusually weak or tired, fainting spells, lightheadedness -breathing problems -changes in hearing -changes in vision -chest pain -high blood pressure -low blood counts - This drug may decrease the number of white blood cells, red blood cells and platelets. You may be at increased risk for infections and bleeding. -nausea and vomiting -pain, swelling, redness or irritation at the injection site -pain, tingling, numbness in the hands or feet -problems with balance, talking, walking -trouble passing urine or change in the amount of urine Side effects that usually do not require medical attention (report to your doctor or health care professional if they continue or are bothersome): -hair loss -loss of appetite -metallic taste in the mouth or changes in taste This list may not describe all possible side effects. Call your doctor for medical advice about side effects. You may report side effects to FDA at 1-800-FDA-1088. Where should I keep my medicine? This drug is given in a hospital or clinic  and will not be stored at home. NOTE: This sheet is a summary. It may not cover all possible information. If you have questions about this medicine, talk to your doctor, pharmacist, or health care provider.  2015, Elsevier/Gold Standard. (2007-12-13 14:38:05) Paclitaxel injection What is  this medicine? PACLITAXEL (PAK li TAX el) is a chemotherapy drug. It targets fast dividing cells, like cancer cells, and causes these cells to die. This medicine is used to treat ovarian cancer, breast cancer, and other cancers. This medicine may be used for other purposes; ask your health care provider or pharmacist if you have questions. COMMON BRAND NAME(S): Onxol, Taxol What should I tell my health care provider before I take this medicine? They need to know if you have any of these conditions: -blood disorders -irregular heartbeat -infection (especially a virus infection such as chickenpox, cold sores, or herpes) -liver disease -previous or ongoing radiation therapy -an unusual or allergic reaction to paclitaxel, alcohol, polyoxyethylated castor oil, other chemotherapy agents, other medicines, foods, dyes, or preservatives -pregnant or trying to get pregnant -breast-feeding How should I use this medicine? This drug is given as an infusion into a vein. It is administered in a hospital or clinic by a specially trained health care professional. Talk to your pediatrician regarding the use of this medicine in children. Special care may be needed. Overdosage: If you think you have taken too much of this medicine contact a poison control center or emergency room at once. NOTE: This medicine is only for you. Do not share this medicine with others. What if I miss a dose? It is important not to miss your dose. Call your doctor or health care professional if you are unable to keep an appointment. What may interact with this medicine? Do not take this medicine with any of the following medications: -disulfiram -metronidazole This medicine may also interact with the following medications: -cyclosporine -diazepam -ketoconazole -medicines to increase blood counts like filgrastim, pegfilgrastim, sargramostim -other chemotherapy drugs like cisplatin, doxorubicin, epirubicin, etoposide, teniposide,  vincristine -quinidine -testosterone -vaccines -verapamil Talk to your doctor or health care professional before taking any of these medicines: -acetaminophen -aspirin -ibuprofen -ketoprofen -naproxen This list may not describe all possible interactions. Give your health care provider a list of all the medicines, herbs, non-prescription drugs, or dietary supplements you use. Also tell them if you smoke, drink alcohol, or use illegal drugs. Some items may interact with your medicine. What should I watch for while using this medicine? Your condition will be monitored carefully while you are receiving this medicine. You will need important blood work done while you are taking this medicine. This drug may make you feel generally unwell. This is not uncommon, as chemotherapy can affect healthy cells as well as cancer cells. Report any side effects. Continue your course of treatment even though you feel ill unless your doctor tells you to stop. In some cases, you may be given additional medicines to help with side effects. Follow all directions for their use. Call your doctor or health care professional for advice if you get a fever, chills or sore throat, or other symptoms of a cold or flu. Do not treat yourself. This drug decreases your body's ability to fight infections. Try to avoid being around people who are sick. This medicine may increase your risk to bruise or bleed. Call your doctor or health care professional if you notice any unusual bleeding. Be careful brushing and flossing your teeth or using a toothpick because you may get  an infection or bleed more easily. If you have any dental work done, tell your dentist you are receiving this medicine. Avoid taking products that contain aspirin, acetaminophen, ibuprofen, naproxen, or ketoprofen unless instructed by your doctor. These medicines may hide a fever. Do not become pregnant while taking this medicine. Women should inform their doctor if  they wish to become pregnant or think they might be pregnant. There is a potential for serious side effects to an unborn child. Talk to your health care professional or pharmacist for more information. Do not breast-feed an infant while taking this medicine. Men are advised not to father a child while receiving this medicine. What side effects may I notice from receiving this medicine? Side effects that you should report to your doctor or health care professional as soon as possible: -allergic reactions like skin rash, itching or hives, swelling of the face, lips, or tongue -low blood counts - This drug may decrease the number of white blood cells, red blood cells and platelets. You may be at increased risk for infections and bleeding. -signs of infection - fever or chills, cough, sore throat, pain or difficulty passing urine -signs of decreased platelets or bleeding - bruising, pinpoint red spots on the skin, black, tarry stools, nosebleeds -signs of decreased red blood cells - unusually weak or tired, fainting spells, lightheadedness -breathing problems -chest pain -high or low blood pressure -mouth sores -nausea and vomiting -pain, swelling, redness or irritation at the injection site -pain, tingling, numbness in the hands or feet -slow or irregular heartbeat -swelling of the ankle, feet, hands Side effects that usually do not require medical attention (report to your doctor or health care professional if they continue or are bothersome): -bone pain -complete hair loss including hair on your head, underarms, pubic hair, eyebrows, and eyelashes -changes in the color of fingernails -diarrhea -loosening of the fingernails -loss of appetite -muscle or joint pain -red flush to skin -sweating This list may not describe all possible side effects. Call your doctor for medical advice about side effects. You may report side effects to FDA at 1-800-FDA-1088. Where should I keep my  medicine? This drug is given in a hospital or clinic and will not be stored at home. NOTE: This sheet is a summary. It may not cover all possible information. If you have questions about this medicine, talk to your doctor, pharmacist, or health care provider.  2015, Elsevier/Gold Standard. (2012-10-31 16:41:21)

## 2014-10-18 NOTE — Assessment & Plan Note (Signed)
Pleasant 54 year old female with stage IV endometrial carcinoma. She is here today for cycle #2 of carboplatin and Taxol. Her mood is good today, and she seems to be doing quite well. We were able to help her obtain Zofran[facility]. We gave her dexamethasone IV during her office visit, and I encouraged her to make sure she has her dexamethasone prior to her next cycle of chemotherapy to avoid potential Taxol reaction. I advised her she is unable to obtain the medication to please let us know and we will see what we can do to help.  I advised her to let us know if she has any difficulties or problems prior to her next follow-up. We again reviewed side effects and symptoms of concern such as nausea, vomiting, diarrhea, fever, chills, or inability to eat. Plan will be return to clinic again in 3 weeks with repeat laboratory studies and cycle #3 of carboplatin and Taxol. We will plan on repeat imaging after cycle 3.

## 2014-10-19 ENCOUNTER — Encounter (HOSPITAL_BASED_OUTPATIENT_CLINIC_OR_DEPARTMENT_OTHER): Payer: Medicaid Other

## 2014-10-19 ENCOUNTER — Encounter (HOSPITAL_COMMUNITY): Payer: Self-pay

## 2014-10-19 DIAGNOSIS — Z5189 Encounter for other specified aftercare: Secondary | ICD-10-CM

## 2014-10-19 DIAGNOSIS — C541 Malignant neoplasm of endometrium: Secondary | ICD-10-CM

## 2014-10-19 LAB — CA 125: CA 125: 25 U/mL (ref <35)

## 2014-10-19 MED ORDER — PEGFILGRASTIM INJECTION 6 MG/0.6ML ~~LOC~~
6.0000 mg | PREFILLED_SYRINGE | Freq: Once | SUBCUTANEOUS | Status: AC
  Administered 2014-10-19: 6 mg via SUBCUTANEOUS

## 2014-10-19 MED ORDER — PEGFILGRASTIM INJECTION 6 MG/0.6ML ~~LOC~~
PREFILLED_SYRINGE | SUBCUTANEOUS | Status: AC
  Filled 2014-10-19: qty 0.6

## 2014-10-19 NOTE — Patient Instructions (Signed)
Pegfilgrastim injection What is this medicine? PEGFILGRASTIM (peg fil GRA stim) is a long-acting granulocyte colony-stimulating factor that stimulates the growth of neutrophils, a type of white blood cell important in the body's fight against infection. It is used to reduce the incidence of fever and infection in patients with certain types of cancer who are receiving chemotherapy that affects the bone marrow. This medicine may be used for other purposes; ask your health care provider or pharmacist if you have questions. COMMON BRAND NAME(S): Neulasta What should I tell my health care provider before I take this medicine? They need to know if you have any of these conditions: -latex allergy -ongoing radiation therapy -sickle cell disease -skin reactions to acrylic adhesives (On-Body Injector only) -an unusual or allergic reaction to pegfilgrastim, filgrastim, other medicines, foods, dyes, or preservatives -pregnant or trying to get pregnant -breast-feeding How should I use this medicine? This medicine is for injection under the skin. If you get this medicine at home, you will be taught how to prepare and give the pre-filled syringe or how to use the On-body Injector. Refer to the patient Instructions for Use for detailed instructions. Use exactly as directed. Take your medicine at regular intervals. Do not take your medicine more often than directed. It is important that you put your used needles and syringes in a special sharps container. Do not put them in a trash can. If you do not have a sharps container, call your pharmacist or healthcare provider to get one. Talk to your pediatrician regarding the use of this medicine in children. Special care may be needed. Overdosage: If you think you have taken too much of this medicine contact a poison control center or emergency room at once. NOTE: This medicine is only for you. Do not share this medicine with others. What if I miss a dose? It is  important not to miss your dose. Call your doctor or health care professional if you miss your dose. If you miss a dose due to an On-body Injector failure or leakage, a new dose should be administered as soon as possible using a single prefilled syringe for manual use. What may interact with this medicine? Interactions have not been studied. Give your health care provider a list of all the medicines, herbs, non-prescription drugs, or dietary supplements you use. Also tell them if you smoke, drink alcohol, or use illegal drugs. Some items may interact with your medicine. This list may not describe all possible interactions. Give your health care provider a list of all the medicines, herbs, non-prescription drugs, or dietary supplements you use. Also tell them if you smoke, drink alcohol, or use illegal drugs. Some items may interact with your medicine. What should I watch for while using this medicine? You may need blood work done while you are taking this medicine. If you are going to need a MRI, CT scan, or other procedure, tell your doctor that you are using this medicine (On-Body Injector only). What side effects may I notice from receiving this medicine? Side effects that you should report to your doctor or health care professional as soon as possible: -allergic reactions like skin rash, itching or hives, swelling of the face, lips, or tongue -dizziness -fever -pain, redness, or irritation at site where injected -pinpoint red spots on the skin -shortness of breath or breathing problems -stomach or side pain, or pain at the shoulder -swelling -tiredness -trouble passing urine Side effects that usually do not require medical attention (report to your doctor   or health care professional if they continue or are bothersome): -bone pain -muscle pain This list may not describe all possible side effects. Call your doctor for medical advice about side effects. You may report side effects to FDA at  1-800-FDA-1088. Where should I keep my medicine? Keep out of the reach of children. Store pre-filled syringes in a refrigerator between 2 and 8 degrees C (36 and 46 degrees F). Do not freeze. Keep in carton to protect from light. Throw away this medicine if it is left out of the refrigerator for more than 48 hours. Throw away any unused medicine after the expiration date. NOTE: This sheet is a summary. It may not cover all possible information. If you have questions about this medicine, talk to your doctor, pharmacist, or health care provider.  2015, Elsevier/Gold Standard. (2013-12-07 16:14:05)  

## 2014-10-19 NOTE — Progress Notes (Signed)
Julia Osborne presents today for injection per the provider's orders.  Neulasta administration without incident; see MAR for injection details.  Patient tolerated procedure well and without incident.  No questions or complaints noted at this time.

## 2014-10-29 ENCOUNTER — Telehealth (HOSPITAL_COMMUNITY): Payer: Self-pay

## 2014-10-29 NOTE — Telephone Encounter (Signed)
Call to patient, per Dr. Donald Pore recommendations patient is to start miralax daily and senokot S 1 twice daily to ensure having BM daily.  If blood recurs in stool to come in for CBC.  Will give refill for tramadol which can be picked up tomorrow.  If symptoms worsen, to go to ED for assistance.  Verbalized understanding of instructions.

## 2014-10-29 NOTE — Telephone Encounter (Signed)
Call from patient with complaints of pain in right side radiating into left pelvis x 4 days.  Discomfort worse with deep breaths and with pressure to area.  Describes as "sharp aggravating pain that is getting worse instead of better.  Pain med helps a little but doesn't completely relieve it."  At times has indigestion which makes the pain worse.  3 days ago had severe diarrhea with some bright blood in stool.  None since but no BM in 2 days.  Denies any fever or nausea or vomiting.  Has been out of oxycodone for several days and is almost out of tramadol.  Wants to know what to do.

## 2014-10-30 ENCOUNTER — Other Ambulatory Visit (HOSPITAL_COMMUNITY): Payer: Self-pay | Admitting: Oncology

## 2014-10-30 ENCOUNTER — Other Ambulatory Visit (HOSPITAL_COMMUNITY): Payer: Self-pay | Admitting: Hematology & Oncology

## 2014-10-30 ENCOUNTER — Telehealth (HOSPITAL_COMMUNITY): Payer: Self-pay | Admitting: *Deleted

## 2014-10-30 DIAGNOSIS — C541 Malignant neoplasm of endometrium: Secondary | ICD-10-CM

## 2014-10-30 MED ORDER — TRAMADOL HCL 50 MG PO TABS: 50.0000 mg | ORAL_TABLET | Freq: Four times a day (QID) | ORAL | Status: DC | PRN

## 2014-10-30 NOTE — Telephone Encounter (Signed)
Discussed with Collie Siad, bowel regimen and refilling tramadol.  She will call the patient.  Patient also advised to call if any additional problems with BRBPR or increasing pain. Dr.P

## 2014-11-06 ENCOUNTER — Other Ambulatory Visit (HOSPITAL_COMMUNITY): Payer: Self-pay | Admitting: Oncology

## 2014-11-06 DIAGNOSIS — C541 Malignant neoplasm of endometrium: Secondary | ICD-10-CM

## 2014-11-06 MED ORDER — TRAMADOL HCL 50 MG PO TABS: 50.0000 mg | ORAL_TABLET | Freq: Four times a day (QID) | ORAL | Status: DC | PRN

## 2014-11-08 ENCOUNTER — Encounter (HOSPITAL_BASED_OUTPATIENT_CLINIC_OR_DEPARTMENT_OTHER): Payer: Medicaid Other | Admitting: Hematology & Oncology

## 2014-11-08 ENCOUNTER — Encounter (HOSPITAL_COMMUNITY): Payer: Self-pay | Admitting: Hematology & Oncology

## 2014-11-08 ENCOUNTER — Encounter (HOSPITAL_COMMUNITY): Payer: Medicaid Other | Attending: Hematology & Oncology

## 2014-11-08 VITALS — BP 130/73 | HR 83 | Temp 98.6°F | Resp 20 | Wt 142.3 lb

## 2014-11-08 DIAGNOSIS — C541 Malignant neoplasm of endometrium: Secondary | ICD-10-CM | POA: Insufficient documentation

## 2014-11-08 DIAGNOSIS — Z5111 Encounter for antineoplastic chemotherapy: Secondary | ICD-10-CM

## 2014-11-08 DIAGNOSIS — J45909 Unspecified asthma, uncomplicated: Secondary | ICD-10-CM | POA: Insufficient documentation

## 2014-11-08 DIAGNOSIS — J449 Chronic obstructive pulmonary disease, unspecified: Secondary | ICD-10-CM | POA: Insufficient documentation

## 2014-11-08 DIAGNOSIS — D259 Leiomyoma of uterus, unspecified: Secondary | ICD-10-CM | POA: Insufficient documentation

## 2014-11-08 DIAGNOSIS — R109 Unspecified abdominal pain: Secondary | ICD-10-CM

## 2014-11-08 DIAGNOSIS — K5900 Constipation, unspecified: Secondary | ICD-10-CM

## 2014-11-08 LAB — CBC WITH DIFFERENTIAL/PLATELET
BASOS ABS: 0 10*3/uL (ref 0.0–0.1)
Basophils Relative: 0 % (ref 0–1)
EOS ABS: 0 10*3/uL (ref 0.0–0.7)
EOS PCT: 0 % (ref 0–5)
HCT: 30.8 % — ABNORMAL LOW (ref 36.0–46.0)
HEMOGLOBIN: 10.1 g/dL — AB (ref 12.0–15.0)
LYMPHS ABS: 1.9 10*3/uL (ref 0.7–4.0)
Lymphocytes Relative: 37 % (ref 12–46)
MCH: 30.7 pg (ref 26.0–34.0)
MCHC: 32.8 g/dL (ref 30.0–36.0)
MCV: 93.6 fL (ref 78.0–100.0)
Monocytes Absolute: 0.7 10*3/uL (ref 0.1–1.0)
Monocytes Relative: 14 % — ABNORMAL HIGH (ref 3–12)
NEUTROS ABS: 2.6 10*3/uL (ref 1.7–7.7)
NEUTROS PCT: 49 % (ref 43–77)
Platelets: 295 10*3/uL (ref 150–400)
RBC: 3.29 MIL/uL — AB (ref 3.87–5.11)
RDW: 18.3 % — ABNORMAL HIGH (ref 11.5–15.5)
WBC: 5.2 10*3/uL (ref 4.0–10.5)

## 2014-11-08 LAB — COMPREHENSIVE METABOLIC PANEL
ALT: 14 U/L (ref 0–35)
AST: 17 U/L (ref 0–37)
Albumin: 3.9 g/dL (ref 3.5–5.2)
Alkaline Phosphatase: 57 U/L (ref 39–117)
Anion gap: 7 (ref 5–15)
BUN: 15 mg/dL (ref 6–23)
CALCIUM: 8.7 mg/dL (ref 8.4–10.5)
CHLORIDE: 104 mmol/L (ref 96–112)
CO2: 26 mmol/L (ref 19–32)
Creatinine, Ser: 0.86 mg/dL (ref 0.50–1.10)
GFR calc non Af Amer: 77 mL/min — ABNORMAL LOW (ref >90)
GFR, EST AFRICAN AMERICAN: 89 mL/min — AB (ref >90)
Glucose, Bld: 87 mg/dL (ref 70–99)
POTASSIUM: 4 mmol/L (ref 3.5–5.1)
Sodium: 137 mmol/L (ref 135–145)
Total Bilirubin: 0.4 mg/dL (ref 0.3–1.2)
Total Protein: 7.1 g/dL (ref 6.0–8.3)

## 2014-11-08 MED ORDER — FAMOTIDINE IN NACL 20-0.9 MG/50ML-% IV SOLN
20.0000 mg | Freq: Once | INTRAVENOUS | Status: AC
  Administered 2014-11-08: 20 mg via INTRAVENOUS

## 2014-11-08 MED ORDER — OXYCODONE-ACETAMINOPHEN 5-325 MG PO TABS: 1.0000 | ORAL_TABLET | ORAL | Status: DC | PRN

## 2014-11-08 MED ORDER — DEXAMETHASONE SODIUM PHOSPHATE 10 MG/ML IJ SOLN: 20.0000 mg | Freq: Once | INTRAMUSCULAR | Status: DC

## 2014-11-08 MED ORDER — DIPHENHYDRAMINE HCL 50 MG/ML IJ SOLN
50.0000 mg | Freq: Once | INTRAMUSCULAR | Status: AC
  Administered 2014-11-08: 50 mg via INTRAVENOUS

## 2014-11-08 MED ORDER — FAMOTIDINE IN NACL 20-0.9 MG/50ML-% IV SOLN
INTRAVENOUS | Status: AC
  Filled 2014-11-08: qty 50

## 2014-11-08 MED ORDER — SODIUM CHLORIDE 0.9 % IV SOLN
20.0000 mg | Freq: Once | INTRAVENOUS | Status: DC
  Filled 2014-11-08: qty 2

## 2014-11-08 MED ORDER — PACLITAXEL CHEMO INJECTION 300 MG/50ML
175.0000 mg/m2 | Freq: Once | INTRAVENOUS | Status: AC
  Administered 2014-11-08: 300 mg via INTRAVENOUS
  Filled 2014-11-08: qty 50

## 2014-11-08 MED ORDER — DIPHENHYDRAMINE HCL 50 MG/ML IJ SOLN
INTRAMUSCULAR | Status: AC
  Filled 2014-11-08: qty 1

## 2014-11-08 MED ORDER — SODIUM CHLORIDE 0.9 % IV SOLN
Freq: Once | INTRAVENOUS | Status: AC
  Administered 2014-11-08: 16 mg via INTRAVENOUS
  Filled 2014-11-08: qty 8

## 2014-11-08 MED ORDER — HEPARIN SOD (PORK) LOCK FLUSH 100 UNIT/ML IV SOLN
500.0000 [IU] | Freq: Once | INTRAVENOUS | Status: AC | PRN
  Administered 2014-11-08: 500 [IU]
  Filled 2014-11-08: qty 5

## 2014-11-08 MED ORDER — SODIUM CHLORIDE 0.9 % IJ SOLN
10.0000 mL | INTRAMUSCULAR | Status: DC | PRN
  Administered 2014-11-08: 10 mL
  Filled 2014-11-08: qty 10

## 2014-11-08 MED ORDER — SODIUM CHLORIDE 0.9 % IV SOLN
Freq: Once | INTRAVENOUS | Status: AC
  Administered 2014-11-08: 09:00:00 via INTRAVENOUS

## 2014-11-08 MED ORDER — SODIUM CHLORIDE 0.9 % IV SOLN: 16.0000 mg | Freq: Once | INTRAVENOUS | Status: DC

## 2014-11-08 MED ORDER — SODIUM CHLORIDE 0.9 % IV SOLN
550.0000 mg | Freq: Once | INTRAVENOUS | Status: AC
  Administered 2014-11-08: 550 mg via INTRAVENOUS
  Filled 2014-11-08: qty 55

## 2014-11-08 MED ORDER — OMEPRAZOLE 40 MG PO CPDR: 40.0000 mg | DELAYED_RELEASE_CAPSULE | Freq: Every day | ORAL | Status: DC

## 2014-11-08 NOTE — Progress Notes (Signed)
Tolerated chemo well. 

## 2014-11-08 NOTE — Patient Instructions (Signed)
[place] [facility]  Discharge Instructions:  You saw Dr Whitney Muse today.  Follow up in 3 weeks with the doctor.  Follow chemotherapy as scheduled.  You will have lab work today and receive [place] and taxol.  We sent some prescriptions to [place] please go by and pick them up.  Also a prescription for oxycodone was given to you.  We are scheduling CT scans for you also.  If you have any questions or concerns please call the clinic.  _______________________________________________________________  Thank you for choosing [place] at [facility] to provide your oncology and hematology care.  To afford each patient quality time with our providers, please arrive at least 15 minutes before your scheduled appointment.  You need to re-schedule your appointment if you arrive 10 or more minutes late.  We strive to give you quality time with our providers, and arriving late affects you and other patients whose appointments are after yours.  Also, if you no show three or more times for appointments you may be dismissed from the clinic.  Again, thank you for choosing [place] [facility] hope is that these requests will allow you access to exceptional care and in a timely manner. _______________________________________________________________  If you have questions after your visit, please contact our office at [phone] between the hours of 8:30 a.m. and 5:00 p.m. Voicemails left after 4:30 p.m. will not be returned until the following business day. _______________________________________________________________  For prescription refill requests, have your pharmacy contact our office. _______________________________________________________________  Recommendations made by the consultant and any test results will be sent to your referring  physician. _______________________________________________________________  Carboplatin injection What is this medicine? CARBOPLATIN (KAR boe pla tin) is a chemotherapy drug. It targets fast dividing cells, like cancer cells, and causes these cells to die. This medicine is used to treat ovarian cancer and many other cancers. This medicine may be used for other purposes; ask your health care provider or pharmacist if you have questions. COMMON BRAND NAME(S): Paraplatin What should I tell my health care provider before I take this medicine? They need to know if you have any of these conditions: -blood disorders -hearing problems -kidney disease -recent or ongoing radiation therapy -an unusual or allergic reaction to carboplatin, cisplatin, other chemotherapy, other medicines, foods, dyes, or preservatives -pregnant or trying to get pregnant -breast-feeding How should I use this medicine? This drug is usually given as an infusion into a vein. It is administered in a hospital or clinic by a specially trained health care professional. Talk to your pediatrician regarding the use of this medicine in children. Special care may be needed. Overdosage: If you think you have taken too much of this medicine contact a poison control center or emergency room at once. NOTE: This medicine is only for you. Do not share this medicine with others. What if I miss a dose? It is important not to miss a dose. Call your doctor or health care professional if you are unable to keep an appointment. What may interact with this medicine? -medicines for seizures -medicines to increase blood counts like filgrastim, pegfilgrastim, sargramostim -some antibiotics like amikacin, gentamicin, neomycin, streptomycin, tobramycin -vaccines Talk to your doctor or health care professional before taking any of these medicines: -acetaminophen -aspirin -ibuprofen -ketoprofen -naproxen This list may not describe all possible  interactions. Give your health care provider a list of all the medicines, herbs, non-prescription drugs, or dietary  supplements you use. Also tell them if you smoke, drink alcohol, or use illegal drugs. Some items may interact with your medicine. What should I watch for while using this medicine? Your condition will be monitored carefully while you are receiving this medicine. You will need important blood work done while you are taking this medicine. This drug may make you feel generally unwell. This is not uncommon, as chemotherapy can affect healthy cells as well as cancer cells. Report any side effects. Continue your course of treatment even though you feel ill unless your doctor tells you to stop. In some cases, you may be given additional medicines to help with side effects. Follow all directions for their use. Call your doctor or health care professional for advice if you get a fever, chills or sore throat, or other symptoms of a cold or flu. Do not treat yourself. This drug decreases your body's ability to fight infections. Try to avoid being around people who are sick. This medicine may increase your risk to bruise or bleed. Call your doctor or health care professional if you notice any unusual bleeding. Be careful brushing and flossing your teeth or using a toothpick because you may get an infection or bleed more easily. If you have any dental work done, tell your dentist you are receiving this medicine. Avoid taking products that contain aspirin, acetaminophen, ibuprofen, naproxen, or ketoprofen unless instructed by your doctor. These medicines may hide a fever. Do not become pregnant while taking this medicine. Women should inform their doctor if they wish to become pregnant or think they might be pregnant. There is a potential for serious side effects to an unborn child. Talk to your health care professional or pharmacist for more information. Do not breast-feed an infant while taking this  medicine. What side effects may I notice from receiving this medicine? Side effects that you should report to your doctor or health care professional as soon as possible: -allergic reactions like skin rash, itching or hives, swelling of the face, lips, or tongue -signs of infection - fever or chills, cough, sore throat, pain or difficulty passing urine -signs of decreased platelets or bleeding - bruising, pinpoint red spots on the skin, black, tarry stools, nosebleeds -signs of decreased red blood cells - unusually weak or tired, fainting spells, lightheadedness -breathing problems -changes in hearing -changes in vision -chest pain -high blood pressure -low blood counts - This drug may decrease the number of white blood cells, red blood cells and platelets. You may be at increased risk for infections and bleeding. -nausea and vomiting -pain, swelling, redness or irritation at the injection site -pain, tingling, numbness in the hands or feet -problems with balance, talking, walking -trouble passing urine or change in the amount of urine Side effects that usually do not require medical attention (report to your doctor or health care professional if they continue or are bothersome): -hair loss -loss of appetite -metallic taste in the mouth or changes in taste This list may not describe all possible side effects. Call your doctor for medical advice about side effects. You may report side effects to FDA at 1-800-FDA-1088. Where should I keep my medicine? This drug is given in a hospital or clinic and will not be stored at home. NOTE: This sheet is a summary. It may not cover all possible information. If you have questions about this medicine, talk to your doctor, pharmacist, or health care provider.  2015, Else[employer]/Gold Standard. (2007-12-13 14:38:05)  Paclitaxel injection What is this  medicine? PACLITAXEL (PAK li TAX el) is a chemotherapy drug. It targets fast dividing cells, like cancer  cells, and causes these cells to die. This medicine is used to treat ovarian cancer, breast cancer, and other cancers. This medicine may be used for other purposes; ask your health care provider or pharmacist if you have questions. COMMON BRAND NAME(S): Onxol, Taxol What should I tell my health care provider before I take this medicine? They need to know if you have any of these conditions: -blood disorders -irregular heartbeat -infection (especially a virus infection such as chickenpox, cold sores, or herpes) -liver disease -previous or ongoing radiation therapy -an unusual or allergic reaction to paclitaxel, alcohol, polyoxyethylated castor oil, other chemotherapy agents, other medicines, foods, dyes, or preservatives -pregnant or trying to get pregnant -breast-feeding How should I use this medicine? This drug is given as an infusion into a vein. It is administered in a hospital or clinic by a specially trained health care professional. Talk to your pediatrician regarding the use of this medicine in children. Special care may be needed. Overdosage: If you think you have taken too much of this medicine contact a poison control center or emergency room at once. NOTE: This medicine is only for you. Do not share this medicine with others. What if I miss a dose? It is important not to miss your dose. Call your doctor or health care professional if you are unable to keep an appointment. What may interact with this medicine? Do not take this medicine with any of the following medications: -disulfiram -metronidazole This medicine may also interact with the following medications: -cyclosporine -diazepam -ketoconazole -medicines to increase blood counts like filgrastim, pegfilgrastim, sargramostim -other chemotherapy drugs like cisplatin, doxorubicin, epirubicin, etoposide, teniposide, vincristine -quinidine -testosterone -vaccines -verapamil Talk to your doctor or health care professional  before taking any of these medicines: -acetaminophen -aspirin -ibuprofen -ketoprofen -naproxen This list may not describe all possible interactions. Give your health care provider a list of all the medicines, herbs, non-prescription drugs, or dietary supplements you use. Also tell them if you smoke, drink alcohol, or use illegal drugs. Some items may interact with your medicine. What should I watch for while using this medicine? Your condition will be monitored carefully while you are receiving this medicine. You will need important blood work done while you are taking this medicine. This drug may make you feel generally unwell. This is not uncommon, as chemotherapy can affect healthy cells as well as cancer cells. Report any side effects. Continue your course of treatment even though you feel ill unless your doctor tells you to stop. In some cases, you may be given additional medicines to help with side effects. Follow all directions for their use. Call your doctor or health care professional for advice if you get a fever, chills or sore throat, or other symptoms of a cold or flu. Do not treat yourself. This drug decreases your body's ability to fight infections. Try to avoid being around people who are sick. This medicine may increase your risk to bruise or bleed. Call your doctor or health care professional if you notice any unusual bleeding. Be careful brushing and flossing your teeth or using a toothpick because you may get an infection or bleed more easily. If you have any dental work done, tell your dentist you are receiving this medicine. Avoid taking products that contain aspirin, acetaminophen, ibuprofen, naproxen, or ketoprofen unless instructed by your doctor. These medicines may hide a fever. Do not become pregnant  while taking this medicine. Women should inform their doctor if they wish to become pregnant or think they might be pregnant. There is a potential for serious side effects to  an unborn child. Talk to your health care professional or pharmacist for more information. Do not breast-feed an infant while taking this medicine. Men are advised not to father a child while receiving this medicine. What side effects may I notice from receiving this medicine? Side effects that you should report to your doctor or health care professional as soon as possible: -allergic reactions like skin rash, itching or hives, swelling of the face, lips, or tongue -low blood counts - This drug may decrease the number of white blood cells, red blood cells and platelets. You may be at increased risk for infections and bleeding. -signs of infection - fever or chills, cough, sore throat, pain or difficulty passing urine -signs of decreased platelets or bleeding - bruising, pinpoint red spots on the skin, black, tarry stools, nosebleeds -signs of decreased red blood cells - unusually weak or tired, fainting spells, lightheadedness -breathing problems -chest pain -high or low blood pressure -mouth sores -nausea and vomiting -pain, swelling, redness or irritation at the injection site -pain, tingling, numbness in the hands or feet -slow or irregular heartbeat -swelling of the ankle, feet, hands Side effects that usually do not require medical attention (report to your doctor or health care professional if they continue or are bothersome): -bone pain -complete hair loss including hair on your head, underarms, pubic hair, eyebrows, and eyelashes -changes in the color of fingernails -diarrhea -loosening of the fingernails -loss of appetite -muscle or joint pain -red flush to skin -sweating This list may not describe all possible side effects. Call your doctor for medical advice about side effects. You may report side effects to FDA at 1-800-FDA-1088. Where should I keep my medicine? This drug is given in a hospital or clinic and will not be stored at home. NOTE: This sheet is a summary. It may  not cover all possible information. If you have questions about this medicine, talk to your doctor, pharmacist, or health care provider.  2015, [employer]vier/Gold Standard. (2012-10-31 16:41:21)

## 2014-11-08 NOTE — Progress Notes (Signed)
[facility] CONSULT NOTE  CHIEF COMPLAINTS/PURPOSE OF CONSULTATION:  Stage IV Endometrial Cancer, probable pulmonary metastases. Radical hysterectomy, BSO, pelvic LN dissection and vaginal biopsies 07/31/2014, patient had extensive pelvic disease including positive LN and positive distal vaginal biopsy  CA-125 137.41 on 07/19/2014 CA-125 25.72 on 09/27/2014  (normal range 0-20.9U/ml)  HISTORY OF PRESENTING ILLNESS:   MERSEDES Osborne 54 y.o. female is here because of stage IV endometrial cancer.  She is here today for cycle 3 of Carboplatin and Taxol. She is still living with her niece. She has gotten her Medicaid and disability approved and is going to be seeking her own place to reside. She did not take her dexamethasone premeds as before. She complains of chronic abdominal pain and states tramadol simply does not help. She has had problems with constipation but has found regular use of lactulose is working well.  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 She is requesting Xanax but understands that she may need to go through her current psychiatrist. She tripped over her dog last evening and scraped her knee. She is a little frustrated because of that. But otherwise concerns are detailed above.    Endometrial cancer   07/12/2014 Imaging CT C/A/P, pelvic adenopathy, multiple pulmonary nodules concerning for metastatic disease   07/30/2014 Initial Diagnosis Endometrial cancer   07/31/2014 Pathology Results endometrioid adenocarcinoma, G2, lymphovascular invasion present, positive pelvic lymph node and vaginal biopsy   07/31/2014 Definitive Surgery radical hysterectomy, BSO, pelvic lymph node dissection and vaginal biopsies   09/25/2014 Procedure Port-A-Cath placement in IR   10/18/2014 -  Chemotherapy Cycle 2 of Carboplatin/Taxol. First cycle given at [facility]:  Past Medical History  Diagnosis Date  . Asthma   . Uterine fibroid    . Cancer     endometrial  . COPD (chronic obstructive pulmonary disease)     no definite diagnosis    SURGICAL HISTORY: Past Surgical History  Procedure Laterality Date  . Tubal ligation    . Appendectomy    . Abdominal hysterectomy    . Portacath placement Right 09/2014    SOCIAL HISTORY: History   Social History  . Marital Status: Single    Spouse Name: N/A  . Number of Children: N/A  . Years of Education: N/A   Occupational History  . Not on file.   Social History Main Topics  . Smoking status: Current Every Day Smoker -- 0.50 packs/day    Types: Cigarettes  . Smokeless tobacco: Not on file  . Alcohol Use: Yes  . Drug Use: No  . Sexual Activity: Not on file   Other Topics Concern  . Not on file   Social History Narrative  She was working, but now has applied for SSI and disability. She is now living with her niece. 2 children. 3 grandchildren, one is deceased. Divorced. Smoker 1/2 ppd. Trying to cut back.  Rare alcohol use.   FAMILY HISTORY: Family History  Problem Relation Age of Onset  . Stroke Mother   . Heart attack Father    indicated that her mother is deceased. She indicated that her father is deceased. She indicated that her sister is alive. She indicated that her brother is alive.    Mother deceased at 32 from embolus Father deceased at 72 from MI 2 sisters and 2 brothers, Oldest sister had nephrectomy for RCC.   ALLERGIES:  is allergic to codeine.  MEDICATIONS:  Current Outpatient Prescriptions  Medication Sig Dispense Refill  . albuterol (PROVENTIL HFA;VENTOLIN HFA) 108 (90 BASE) MCG/ACT inhaler Inhale 2 puffs into the lungs every 2 (two) hours as needed for wheezing or shortness of breath (cough). (Patient taking differently: Inhale 1 puff into the lungs every 6 (six) hours as needed for wheezing or shortness of breath (cough). ) 1 Inhaler 0  . citalopram (CELEXA) 20 MG tablet Take 20 mg by mouth daily.    . ferrous sulfate 325 (65 FE) MG EC  tablet Take 1 tablet (325 mg total) by mouth 2 (two) times daily.    . ondansetron (ZOFRAN) 8 MG tablet Take 1 tablet (8 mg total) by mouth every 8 (eight) hours as needed for nausea or vomiting. 30 tablet 2  . oxyCODONE-acetaminophen (PERCOCET/ROXICET) 5-325 MG per tablet Take 1 tablet by mouth every 4 (four) hours as needed for moderate pain or severe pain. 30 tablet 0  . promethazine (PHENERGAN) 12.5 MG tablet Take 12.5 mg by mouth every 6 (six) hours as needed. nausea    . traMADol (ULTRAM) 50 MG tablet Take 1 tablet (50 mg total) by mouth every 6 (six) hours as needed. 30 tablet 0  . traZODone (DESYREL) 50 MG tablet Take 50 mg by mouth at bedtime.    Marland Kitchen dexamethasone (DECADRON) 4 MG tablet The day before chemo, take 20mg  (5 tablets) in the am and 20mg  (5 tablets) in the pm. Take with food. (Patient not taking: Reported on 10/18/2014) 60 tablet 0  . hydrOXYzine (VISTARIL) 50 MG capsule Take 1 capsule (50 mg total) by mouth 3 (three) times daily as needed for anxiety. (Patient not taking: Reported on 10/08/2014) 30 capsule 0  . ibuprofen (ADVIL,MOTRIN) 200 MG tablet Take 1 tablet (200 mg total) by mouth every 6 (six) hours as needed for mild pain. (Patient not taking: Reported on 10/18/2014) 30 tablet   . omeprazole (PRILOSEC) 40 MG capsule Take 1 capsule (40 mg total) by mouth daily. 30 capsule 2  . phenazopyridine (PYRIDIUM) 200 MG tablet Take 1 tablet (200 mg total) by mouth 3 (three) times daily. (Patient not taking: Reported on 10/18/2014) 10 tablet    Current Facility-Administered Medications  Medication Dose Route Frequency Provider Last Rate Last Dose  . dexamethasone (DECADRON) 20 mg in sodium chloride 0.9 % 50 mL IVPB  20 mg Intravenous Once Molli Hazard, MD       Facility-Administered Medications Ordered in Other Visits  Medication Dose Route Frequency Provider Last Rate Last Dose  . CARBOplatin (PARAPLATIN) 520 mg in sodium chloride 0.9 % 250 mL chemo infusion  520 mg  Intravenous Once Molli Hazard, MD      . famotidine (PEPCID) IVPB 20 mg  20 mg Intravenous Once Molli Hazard, MD   20 mg at 11/08/14 9892  . heparin lock flush 100 unit/mL  500 Units Intracatheter Once PRN Molli Hazard, MD      . PACLitaxel (TAXOL) 300 mg in dextrose 5 % 250 mL chemo infusion (> 80mg /m2)  175 mg/m2 (Treatment Plan Actual) Intravenous Once Molli Hazard, MD      . sodium chloride 0.9 % injection 10 mL  10 mL Intracatheter PRN Molli Hazard, MD   10 mL at 11/08/14 1194    Review of Systems  Constitutional: Positive for malaise/fatigue.  HENT:       Rare headache  Eyes: Negative.   Respiratory: Negative.   Cardiovascular: Negative.   Gastrointestinal: Positive for abdominal pain and  constipation.  Genitourinary: Negative.   Musculoskeletal: Negative.   Skin: Negative.   Neurological: Positive for headaches. Negative for dizziness, tingling, tremors, sensory change, speech change, focal weakness, seizures and loss of consciousness.  Endo/Heme/Allergies: Negative.   Psychiatric/Behavioral: Positive for depression. The patient is nervous/anxious.     PHYSICAL EXAMINATION:  ECOG PERFORMANCE STATUS: 0 - Asymptomatic  Filed Vitals:   11/08/14 0800  BP: 130/73  Pulse: 83  Temp: 98.6 F (37 C)  Resp: 20   Filed Weights   11/08/14 0800  Weight: 142 lb 4.8 oz (64.547 kg)     Physical Exam  Constitutional: She is oriented to person, place, and time and well-developed, well-nourished, and in no distress.  HENT:  Head: Normocephalic and atraumatic.  Nose: Nose normal.  Mouth/Throat: Oropharynx is clear and moist. No oropharyngeal exudate.  Eyes: Conjunctivae and EOM are normal. Pupils are equal, round, and reactive to light. Right eye exhibits no discharge. Left eye exhibits no discharge. No scleral icterus.  Neck: Normal range of motion. Neck supple. No tracheal deviation present. No thyromegaly present.   Cardiovascular: Normal rate, regular rhythm and normal heart sounds.  Exam reveals no gallop and no friction rub.   No murmur heard. Pulmonary/Chest: Effort normal and breath sounds normal. She has no wheezes. She has no rales.  Abdominal: Soft. Bowel sounds are normal. She exhibits no distension and no mass. There is no tenderness. There is no rebound and no guarding.  Well healed abdominal surgical incision site  Musculoskeletal: Normal range of motion. She exhibits no edema.  Lymphadenopathy:    She has no cervical adenopathy.  Neurological: She is alert and oriented to person, place, and time. She has normal reflexes. No cranial nerve deficit. Gait normal. Coordination normal.  Skin: Skin is warm and dry. No rash noted.  Psychiatric: Mood, memory, affect and judgment normal.  Nursing note and vitals reviewed.    LABORATORY DATA:  I have reviewed the data as listed Lab Results  Component Value Date   WBC 5.2 11/08/2014   HGB 10.1* 11/08/2014   HCT 30.8* 11/08/2014   MCV 93.6 11/08/2014   PLT 295 11/08/2014     Chemistry      Component Value Date/Time   NA 137 11/08/2014 0850   K 4.0 11/08/2014 0850   CL 104 11/08/2014 0850   CO2 26 11/08/2014 0850   BUN 15 11/08/2014 0850   CREATININE 0.86 11/08/2014 0850      Component Value Date/Time   CALCIUM 8.7 11/08/2014 0850   ALKPHOS 57 11/08/2014 0850   AST 17 11/08/2014 0850   ALT 14 11/08/2014 0850   BILITOT 0.4 11/08/2014 0850       ASSESSMENT & PLAN:  Endometrial cancer Pleasant 54 year old female with stage IV endometrial cancer currently on carboplatin and Taxol. She has excellent tolerance of chemotherapy. A lot of her problems revolve around anxiety and depression and she is working with vocal behavioral health. I have encouraged her to talk about Xanax use with her therapist next week at her appointment.  She has problems with abdominal pain which I feel are post surgical and secondary to constipation. I have  encouraged her to continue her lactulose daily as this tends to keep her regular. She is due for repeat restaging and we will arrange for CT chest abdomen and pelvis to be done prior to her next cycle. I have advised her if her abdominal pain worsens or if she develops fever, nausea, vomiting to let us know.  She has some problems with persistent low-grade mild nausea and only takes occasional ranitidine. I have called her in Prilosec to use daily. I again readdressed with her how important it is to take her dexamethasone premedications. She assured me she would do this for her next cycle.    Orders Placed This Encounter  Procedures  . CT Abdomen Pelvis W Contrast    Amy, Order in sys, creat drawn 11/08/14, Medicaid, nbs    Standing Status: Future     Number of Occurrences:      Standing Expiration Date: 11/08/2015    Order Specific Question:  Reason for Exam (SYMPTOM  OR DIAGNOSIS REQUIRED)    Answer:  restaging endometrial cancer Stage IV    Order Specific Question:  Is the patient pregnant?    Answer:  No    Order Specific Question:  Preferred imaging location?    Answer:  [facility]  . CT Chest W Contrast    Amy, Order in [place], creat drawn 11/08/14, Medicaid, nbs     Standing Status: Future     Number of Occurrences:      Standing Expiration Date: 11/08/2015    Order Specific Question:  Reason for Exam (SYMPTOM  OR DIAGNOSIS REQUIRED)    Answer:  restaging stage IV endometrial cancer    Order Specific Question:  Is the patient pregnant?    Answer:  No    Order Specific Question:  Preferred imaging location?    Answer:  [facility]  . CBC with Differential    Standing Status: Future     Number of Occurrences:      Standing Expiration Date: 11/09/2015  . Comprehensive metabolic panel    Standing Status: Future     Number of Occurrences:      Standing Expiration Date: 11/09/2015  . CA 125    Standing Status: Future     Number of Occurrences:      Standing  Expiration Date: 11/09/2015    All questions were answered. The patient knows to call the clinic with any problems, questions or concerns.   Molli Hazard, MD MD 11/08/2014 9:32 AM

## 2014-11-08 NOTE — Patient Instructions (Signed)
[facility] Discharge Instructions for Patients Receiving Chemotherapy  Today you received the following chemotherapy agents Taxol and Carbo.  To help prevent nausea and vomiting after your treatment, we encourage you to take your nausea medication as instructed. If you develop nausea and vomiting that is not controlled by your nausea medication, call the clinic. If it is after clinic hours your family physician or the after hours number for the clinic or go to the Emergency Department.  BELOW ARE SYMPTOMS THAT SHOULD BE REPORTED IMMEDIATELY:  *FEVER GREATER THAN 101.0 F  *CHILLS WITH OR WITHOUT FEVER  NAUSEA AND VOMITING THAT IS NOT CONTROLLED WITH YOUR NAUSEA MEDICATION  *UNUSUAL SHORTNESS OF BREATH  *UNUSUAL BRUISING OR BLEEDING  TENDERNESS IN MOUTH AND THROAT WITH OR WITHOUT PRESENCE OF ULCERS  *URINARY PROBLEMS  *BOWEL PROBLEMS  UNUSUAL RASH Items with * indicate a potential emergency and should be followed up as soon as possible. Return as scheduled.  I have been informed and understand all the instructions given to me. I know to contact the clinic, my physician, or go to the Emergency Department if any problems should occur. I do not have any questions at this time, but understand that I may call the clinic during office hours or the Patient Navigator at [phone] should I have any questions or need assistance in obtaining follow up care.    __________________________________________  _____________  __________ Signature of Patient or Authorized Representative            Date                   Time    __________________________________________ Nurse's Signature  

## 2014-11-08 NOTE — Assessment & Plan Note (Signed)
Pleasant 54 year old female with stage IV endometrial cancer currently on carboplatin and Taxol. She has excellent tolerance of chemotherapy. A lot of her problems revolve around anxiety and depression and she is working with vocal behavioral health. I have encouraged her to talk about Xanax use with her therapist next week at her appointment.  She has problems with abdominal pain which I feel are post surgical and secondary to constipation. I have encouraged her to continue her lactulose daily as this tends to keep her regular. She is due for repeat restaging and we will arrange for CT chest abdomen and pelvis to be done prior to her next cycle. I have advised her if her abdominal pain worsens or if she develops fever, nausea, vomiting to let us know.  She has some problems with persistent low-grade mild nausea and only takes occasional ranitidine. I have called her in Prilosec to use daily. I again readdressed with her how important it is to take her dexamethasone premedications. She assured me she would do this for her next cycle.

## 2014-11-09 ENCOUNTER — Encounter (HOSPITAL_COMMUNITY): Payer: Medicaid Other | Attending: Hematology & Oncology

## 2014-11-09 ENCOUNTER — Encounter (HOSPITAL_COMMUNITY): Payer: Self-pay

## 2014-11-09 VITALS — BP 141/69 | HR 85 | Temp 98.0°F | Resp 20

## 2014-11-09 DIAGNOSIS — C541 Malignant neoplasm of endometrium: Secondary | ICD-10-CM

## 2014-11-09 DIAGNOSIS — Z5189 Encounter for other specified aftercare: Secondary | ICD-10-CM

## 2014-11-09 LAB — CA 125: CA 125: 16.8 U/mL (ref 0.0–34.0)

## 2014-11-09 MED ORDER — LORAZEPAM 2 MG/ML IJ SOLN
0.5000 mg | Freq: Once | INTRAMUSCULAR | Status: AC
  Administered 2014-11-09: 0.5 mg via INTRAVENOUS
  Filled 2014-11-09: qty 1

## 2014-11-09 MED ORDER — SODIUM CHLORIDE 0.9 % IV SOLN: 8.0000 mg | Freq: Once | INTRAVENOUS | Status: DC

## 2014-11-09 MED ORDER — SODIUM CHLORIDE 0.9 % IV SOLN
Freq: Once | INTRAVENOUS | Status: AC
  Administered 2014-11-09: 8 mg via INTRAVENOUS
  Filled 2014-11-09: qty 4

## 2014-11-09 MED ORDER — PEGFILGRASTIM INJECTION 6 MG/0.6ML ~~LOC~~
6.0000 mg | PREFILLED_SYRINGE | Freq: Once | SUBCUTANEOUS | Status: AC
  Administered 2014-11-09: 6 mg via SUBCUTANEOUS
  Filled 2014-11-09: qty 0.6

## 2014-11-09 MED ORDER — SODIUM CHLORIDE 0.9 % IV SOLN
INTRAVENOUS | Status: DC
  Administered 2014-11-09: 16:00:00 via INTRAVENOUS

## 2014-11-09 MED ORDER — DEXAMETHASONE SODIUM PHOSPHATE 10 MG/ML IJ SOLN: 8.0000 mg | Freq: Once | INTRAMUSCULAR | Status: DC

## 2014-11-09 MED ORDER — HEPARIN SOD (PORK) LOCK FLUSH 100 UNIT/ML IV SOLN
INTRAVENOUS | Status: AC
Start: 2014-11-09 — End: 2014-11-09
  Filled 2014-11-09: qty 5

## 2014-11-09 MED ORDER — HEPARIN SOD (PORK) LOCK FLUSH 100 UNIT/ML IV SOLN
500.0000 [IU] | Freq: Once | INTRAVENOUS | Status: AC
  Administered 2014-11-09: 500 [IU] via INTRAVENOUS

## 2014-11-09 NOTE — Progress Notes (Signed)
Patient states that she feels better and that nausea is relieved after fluid and antiemetics. Has Rx for compazine that she will pick up at home

## 2014-11-09 NOTE — Patient Instructions (Signed)
Please call us on Monday if nausea returns. Push fluids over weekend and report to ED with uncontrolled nausea/vomitting

## 2014-11-20 ENCOUNTER — Ambulatory Visit (HOSPITAL_COMMUNITY)
Admission: RE | Admit: 2014-11-20 | Discharge: 2014-11-20 | Disposition: A | Discharge status: Home / Self Care | Payer: Medicaid Other | Source: Ambulatory Visit | Attending: Hematology & Oncology | Admitting: Hematology & Oncology

## 2014-11-20 DIAGNOSIS — R918 Other nonspecific abnormal finding of lung field: Secondary | ICD-10-CM | POA: Insufficient documentation

## 2014-11-20 DIAGNOSIS — Z90710 Acquired absence of both cervix and uterus: Secondary | ICD-10-CM | POA: Insufficient documentation

## 2014-11-20 DIAGNOSIS — K5730 Diverticulosis of large intestine without perforation or abscess without bleeding: Secondary | ICD-10-CM | POA: Insufficient documentation

## 2014-11-20 DIAGNOSIS — Z08 Encounter for follow-up examination after completed treatment for malignant neoplasm: Secondary | ICD-10-CM | POA: Insufficient documentation

## 2014-11-20 DIAGNOSIS — C541 Malignant neoplasm of endometrium: Secondary | ICD-10-CM | POA: Insufficient documentation

## 2014-11-20 MED ORDER — IOHEXOL 300 MG/ML  SOLN
100.0000 mL | Freq: Once | INTRAMUSCULAR | Status: AC | PRN
  Administered 2014-11-20: 100 mL via INTRAVENOUS

## 2014-11-22 ENCOUNTER — Telehealth (HOSPITAL_COMMUNITY): Payer: Self-pay

## 2014-11-22 NOTE — Telephone Encounter (Signed)
Call from patient requesting results from recent CT Scan.

## 2014-11-22 NOTE — Telephone Encounter (Signed)
Patient notified per Dr. Whitney Muse that her scans look excellent.

## 2014-11-22 NOTE — Telephone Encounter (Signed)
Would like results from most recent scan.

## 2014-11-29 ENCOUNTER — Encounter (HOSPITAL_COMMUNITY): Payer: Self-pay | Admitting: Hematology & Oncology

## 2014-11-29 ENCOUNTER — Encounter (HOSPITAL_BASED_OUTPATIENT_CLINIC_OR_DEPARTMENT_OTHER): Payer: Medicaid Other | Admitting: Hematology & Oncology

## 2014-11-29 ENCOUNTER — Encounter (HOSPITAL_COMMUNITY): Payer: Medicaid Other | Attending: Hematology & Oncology

## 2014-11-29 ENCOUNTER — Encounter (HOSPITAL_COMMUNITY): Payer: Self-pay

## 2014-11-29 DIAGNOSIS — J449 Chronic obstructive pulmonary disease, unspecified: Secondary | ICD-10-CM | POA: Insufficient documentation

## 2014-11-29 DIAGNOSIS — C541 Malignant neoplasm of endometrium: Secondary | ICD-10-CM | POA: Diagnosis present

## 2014-11-29 DIAGNOSIS — J45909 Unspecified asthma, uncomplicated: Secondary | ICD-10-CM | POA: Diagnosis not present

## 2014-11-29 DIAGNOSIS — R911 Solitary pulmonary nodule: Secondary | ICD-10-CM

## 2014-11-29 DIAGNOSIS — D259 Leiomyoma of uterus, unspecified: Secondary | ICD-10-CM | POA: Diagnosis not present

## 2014-11-29 DIAGNOSIS — Z5111 Encounter for antineoplastic chemotherapy: Secondary | ICD-10-CM

## 2014-11-29 LAB — COMPREHENSIVE METABOLIC PANEL
ALBUMIN: 3.8 g/dL (ref 3.5–5.2)
ALT: 16 U/L (ref 0–35)
AST: 17 U/L (ref 0–37)
Alkaline Phosphatase: 57 U/L (ref 39–117)
Anion gap: 7 (ref 5–15)
BUN: 15 mg/dL (ref 6–23)
CO2: 24 mmol/L (ref 19–32)
CREATININE: 0.8 mg/dL (ref 0.50–1.10)
Calcium: 8.8 mg/dL (ref 8.4–10.5)
Chloride: 105 mmol/L (ref 96–112)
GFR calc non Af Amer: 84 mL/min — ABNORMAL LOW (ref >90)
GLUCOSE: 83 mg/dL (ref 70–99)
Potassium: 4.1 mmol/L (ref 3.5–5.1)
SODIUM: 136 mmol/L (ref 135–145)
Total Bilirubin: 0.5 mg/dL (ref 0.3–1.2)
Total Protein: 6.7 g/dL (ref 6.0–8.3)

## 2014-11-29 LAB — CBC WITH DIFFERENTIAL/PLATELET
Basophils Absolute: 0 10*3/uL (ref 0.0–0.1)
Basophils Relative: 0 % (ref 0–1)
EOS ABS: 0 10*3/uL (ref 0.0–0.7)
EOS PCT: 1 % (ref 0–5)
HCT: 31.3 % — ABNORMAL LOW (ref 36.0–46.0)
Hemoglobin: 10.3 g/dL — ABNORMAL LOW (ref 12.0–15.0)
LYMPHS ABS: 1.7 10*3/uL (ref 0.7–4.0)
Lymphocytes Relative: 37 % (ref 12–46)
MCH: 32.1 pg (ref 26.0–34.0)
MCHC: 32.9 g/dL (ref 30.0–36.0)
MCV: 97.5 fL (ref 78.0–100.0)
MONO ABS: 0.8 10*3/uL (ref 0.1–1.0)
Monocytes Relative: 17 % — ABNORMAL HIGH (ref 3–12)
Neutro Abs: 2 10*3/uL (ref 1.7–7.7)
Neutrophils Relative %: 45 % (ref 43–77)
PLATELETS: 190 10*3/uL (ref 150–400)
RBC: 3.21 MIL/uL — AB (ref 3.87–5.11)
RDW: 15.9 % — ABNORMAL HIGH (ref 11.5–15.5)
WBC: 4.6 10*3/uL (ref 4.0–10.5)

## 2014-11-29 MED ORDER — SODIUM CHLORIDE 0.9 % IV SOLN
Freq: Once | INTRAVENOUS | Status: AC
  Administered 2014-11-29: 09:00:00 via INTRAVENOUS

## 2014-11-29 MED ORDER — PACLITAXEL CHEMO INJECTION 300 MG/50ML
175.0000 mg/m2 | Freq: Once | INTRAVENOUS | Status: AC
  Administered 2014-11-29: 300 mg via INTRAVENOUS
  Filled 2014-11-29: qty 50

## 2014-11-29 MED ORDER — ONDANSETRON HCL 40 MG/20ML IJ SOLN
Freq: Once | INTRAMUSCULAR | Status: AC
  Administered 2014-11-29: 16 mg via INTRAVENOUS
  Filled 2014-11-29: qty 8

## 2014-11-29 MED ORDER — SODIUM CHLORIDE 0.9 % IV SOLN
548.0000 mg | Freq: Once | INTRAVENOUS | Status: AC
  Administered 2014-11-29: 550 mg via INTRAVENOUS
  Filled 2014-11-29: qty 55

## 2014-11-29 MED ORDER — DEXAMETHASONE SODIUM PHOSPHATE 10 MG/ML IJ SOLN: 20.0000 mg | Freq: Once | INTRAMUSCULAR | Status: DC

## 2014-11-29 MED ORDER — DIPHENHYDRAMINE HCL 50 MG/ML IJ SOLN
INTRAMUSCULAR | Status: AC
  Filled 2014-11-29: qty 1

## 2014-11-29 MED ORDER — FAMOTIDINE IN NACL 20-0.9 MG/50ML-% IV SOLN
INTRAVENOUS | Status: AC
  Filled 2014-11-29: qty 50

## 2014-11-29 MED ORDER — HEPARIN SOD (PORK) LOCK FLUSH 100 UNIT/ML IV SOLN
500.0000 [IU] | Freq: Once | INTRAVENOUS | Status: AC | PRN
  Administered 2014-11-29: 500 [IU]
  Filled 2014-11-29: qty 5

## 2014-11-29 MED ORDER — SODIUM CHLORIDE 0.9 % IJ SOLN: 10.0000 mL | INTRAMUSCULAR | Status: DC | PRN

## 2014-11-29 MED ORDER — SODIUM CHLORIDE 0.9 % IV SOLN: 16.0000 mg | Freq: Once | INTRAVENOUS | Status: DC

## 2014-11-29 MED ORDER — FAMOTIDINE IN NACL 20-0.9 MG/50ML-% IV SOLN
20.0000 mg | Freq: Once | INTRAVENOUS | Status: AC
Start: 2014-11-29 — End: 2014-11-29
  Administered 2014-11-29: 20 mg via INTRAVENOUS

## 2014-11-29 MED ORDER — DIPHENHYDRAMINE HCL 50 MG/ML IJ SOLN
50.0000 mg | Freq: Once | INTRAMUSCULAR | Status: AC
  Administered 2014-11-29: 50 mg via INTRAVENOUS

## 2014-11-29 NOTE — Progress Notes (Signed)
[facility] CONSULT NOTE  CHIEF COMPLAINTS/PURPOSE OF CONSULTATION:  Stage IV Endometrial Cancer, probable pulmonary metastases. Radical hysterectomy, BSO, pelvic LN dissection and vaginal biopsies 07/31/2014, patient had extensive pelvic disease including positive LN and positive distal vaginal biopsy  CA-125 137.41 on 07/19/2014 CA-125 25.72 on 09/27/2014  (normal range 0-20.9U/ml)  HISTORY OF PRESENTING ILLNESS:   Julia Osborne 54 y.o. female is here because of stage IV endometrial cancer.  She continues to improve. She denies any symptoms consistent with peripheral neuropathy. Her appetite is good. She has been out and gone shopping. She has applied for "low income" housing. She still continues to live with her niece. She has no major complaints today. She is interested in services available to cancer patients specifically yoga classes. She is here for her next cycle of Carboplatin and Taxol. She is also here to review her recent CT scans.    Endometrial cancer   07/12/2014 Imaging CT C/A/P, pelvic adenopathy, multiple pulmonary nodules concerning for metastatic disease   07/30/2014 Initial Diagnosis Endometrial cancer   07/31/2014 Pathology Results endometrioid adenocarcinoma, G2, lymphovascular invasion present, positive pelvic lymph node and vaginal biopsy   07/31/2014 Definitive Surgery radical hysterectomy, BSO, pelvic lymph node dissection and vaginal biopsies   09/25/2014 Procedure Port-A-Cath placement in IR   10/18/2014 -  Chemotherapy Cycle 2 of Carboplatin/Taxol. First cycle given at [place]:  Past Medical History  Diagnosis Date  . Asthma   . Uterine fibroid   . Cancer     endometrial  . COPD (chronic obstructive pulmonary disease)     no definite diagnosis    SURGICAL HISTORY: Past Surgical History  Procedure Laterality Date  . Tubal ligation    . Appendectomy    . Abdominal hysterectomy    . Portacath placement Right 09/2014     SOCIAL HISTORY: History   Social History  . Marital Status: Single    Spouse Name: N/A  . Number of Children: N/A  . Years of Education: N/A   Occupational History  . Not on file.   Social History Main Topics  . Smoking status: Current Every Day Smoker -- 0.50 packs/day    Types: Cigarettes  . Smokeless tobacco: Not on file  . Alcohol Use: Yes  . Drug Use: No  . Sexual Activity: Not on file   Other Topics Concern  . Not on file   Social History Narrative  She was working, but now has applied for SSI and disability. She is now living with her niece. 2 children. 3 grandchildren, one is deceased. Divorced. Smoker 1/2 ppd. Trying to cut back.  Rare alcohol use.   FAMILY HISTORY: Family History  Problem Relation Age of Onset  . Stroke Mother   . Heart attack Father    indicated that her mother is deceased. She indicated that her father is deceased. She indicated that her sister is alive. She indicated that her brother is alive.    Mother deceased at 29 from embolus Father deceased at 61 from MI 2 sisters and 2 brothers, Oldest sister had nephrectomy for RCC.   ALLERGIES:  is allergic to codeine.  MEDICATIONS:  Current Outpatient Prescriptions  Medication Sig Dispense Refill  . albuterol (PROVENTIL HFA;VENTOLIN HFA) 108 (90 BASE) MCG/ACT inhaler Inhale 2 puffs into the lungs every 2 (two) hours as needed for wheezing or shortness of breath (cough). (Patient taking differently: Inhale 1 puff into the lungs every 6 (six) hours as needed  for wheezing or shortness of breath (cough). ) 1 Inhaler 0  . ALPRAZolam (XANAX) 0.25 MG tablet Take 0.25 mg by mouth 3 (three) times daily as needed for anxiety. Pt unsure of dose    . citalopram (CELEXA) 20 MG tablet Take 20 mg by mouth daily.    Marland Kitchen dexamethasone (DECADRON) 4 MG tablet The day before chemo, take 20mg  (5 tablets) in the am and 20mg  (5 tablets) in the pm. Take with food. 60 tablet 0  . ferrous sulfate 325 (65 FE) MG EC  tablet Take 1 tablet (325 mg total) by mouth 2 (two) times daily.    . hydrOXYzine (VISTARIL) 50 MG capsule Take 1 capsule (50 mg total) by mouth 3 (three) times daily as needed for anxiety. 30 capsule 0  . ibuprofen (ADVIL,MOTRIN) 200 MG tablet Take 1 tablet (200 mg total) by mouth every 6 (six) hours as needed for mild pain. 30 tablet   . omeprazole (PRILOSEC) 40 MG capsule Take 1 capsule (40 mg total) by mouth daily. 30 capsule 2  . ondansetron (ZOFRAN) 8 MG tablet Take 1 tablet (8 mg total) by mouth every 8 (eight) hours as needed for nausea or vomiting. 30 tablet 2  . oxyCODONE-acetaminophen (PERCOCET/ROXICET) 5-325 MG per tablet Take 1 tablet by mouth every 4 (four) hours as needed for moderate pain or severe pain. 30 tablet 0  . phenazopyridine (PYRIDIUM) 200 MG tablet Take 1 tablet (200 mg total) by mouth 3 (three) times daily. 10 tablet   . promethazine (PHENERGAN) 12.5 MG tablet Take 12.5 mg by mouth every 6 (six) hours as needed. nausea    . traMADol (ULTRAM) 50 MG tablet Take 1 tablet (50 mg total) by mouth every 6 (six) hours as needed. 30 tablet 0  . traZODone (DESYREL) 50 MG tablet Take 50 mg by mouth at bedtime.     No current facility-administered medications for this visit.   Facility-Administered Medications Ordered in Other Visits  Medication Dose Route Frequency Provider Last Rate Last Dose  . CARBOplatin (PARAPLATIN) 550 mg in sodium chloride 0.9 % 250 mL chemo infusion  550 mg Intravenous Once Patrici Ranks, MD 610 mL/hr at 11/29/14 1542 550 mg at 11/29/14 1542  . dexamethasone (DECADRON) 20 mg in sodium chloride 0.9 % 50 mL IVPB  20 mg Intravenous Once Patrici Ranks, MD      . heparin lock flush 100 unit/mL  500 Units Intracatheter Once PRN Patrici Ranks, MD      . sodium chloride 0.9 % injection 10 mL  10 mL Intracatheter PRN Patrici Ranks, MD        Review of Systems  Constitutional: Negative for fever, chills, weight loss and malaise/fatigue.  HENT:  Negative for congestion, hearing loss, nosebleeds, sore throat and tinnitus.   Eyes: Negative for blurred vision, double vision, pain and discharge.  Respiratory: Negative for cough, hemoptysis, sputum production, shortness of breath and wheezing.   Cardiovascular: Negative for chest pain, palpitations, claudication, leg swelling and PND.  Gastrointestinal: Negative for heartburn, nausea, vomiting, abdominal pain, diarrhea, constipation, blood in stool and melena.  Genitourinary: Negative for dysuria, urgency, frequency and hematuria.  Musculoskeletal: Negative for myalgias, joint pain and falls.  Skin: Negative for itching and rash.  Neurological: Negative for dizziness, tingling, tremors, sensory change, speech change, focal weakness, seizures, loss of consciousness, weakness and headaches.  Endo/Heme/Allergies: Does not bruise/bleed easily.  Psychiatric/Behavioral: Negative for depression, suicidal ideas, memory loss and substance abuse. The patient is not  nervous/anxious and does not have insomnia.     PHYSICAL EXAMINATION:  ECOG PERFORMANCE STATUS: 0 - Asymptomatic  There were no vitals filed for this visit. There were no vitals filed for this visit.   Physical Exam  Constitutional: She is oriented to person, place, and time and well-developed, well-nourished, and in no distress.  HENT:  Head: Normocephalic and atraumatic.  Nose: Nose normal.  Mouth/Throat: Oropharynx is clear and moist. No oropharyngeal exudate.  Eyes: Conjunctivae and EOM are normal. Pupils are equal, round, and reactive to light. Right eye exhibits no discharge. Left eye exhibits no discharge. No scleral icterus.  Neck: Normal range of motion. Neck supple. No tracheal deviation present. No thyromegaly present.  Cardiovascular: Normal rate, regular rhythm and normal heart sounds.  Exam reveals no gallop and no friction rub.   No murmur heard. Pulmonary/Chest: Effort normal and breath sounds normal. She has no  wheezes. She has no rales.  Abdominal: Soft. Bowel sounds are normal. She exhibits no distension and no mass. There is no tenderness. There is no rebound and no guarding.  Well healed abdominal surgical incision site  Musculoskeletal: Normal range of motion. She exhibits no edema.  Lymphadenopathy:    She has no cervical adenopathy.  Neurological: She is alert and oriented to person, place, and time. She has normal reflexes. No cranial nerve deficit. Gait normal. Coordination normal.  Skin: Skin is warm and dry. No rash noted.  Psychiatric: Mood, memory, affect and judgment normal.  Nursing note and vitals reviewed.    LABORATORY DATA:  I have reviewed the data as listed Lab Results  Component Value Date   WBC 4.6 11/29/2014   HGB 10.3* 11/29/2014   HCT 31.3* 11/29/2014   MCV 97.5 11/29/2014   PLT 190 11/29/2014     Chemistry      Component Value Date/Time   NA 136 11/29/2014 0925   K 4.1 11/29/2014 0925   CL 105 11/29/2014 0925   CO2 24 11/29/2014 0925   BUN 15 11/29/2014 0925   CREATININE 0.80 11/29/2014 0925      Component Value Date/Time   CALCIUM 8.8 11/29/2014 0925   ALKPHOS 57 11/29/2014 0925   AST 17 11/29/2014 0925   ALT 16 11/29/2014 0925   BILITOT 0.5 11/29/2014 0925     CLINICAL DATA: Restaging endometrial carcinoma status post 3 cycles of chemotherapy, last administered 3 weeks ago. Initial diagnosis 07/30/2014 with subsequent hysterectomy. Subsequent encounter.  EXAM: CT CHEST, ABDOMEN, AND PELVIS WITH CONTRAST IMPRESSION: 1. Status post total hysterectomy. No evidence of residual pelvic mass. 2. Stable mildly prominent retroperitoneal lymph nodes from 2008. No evidence of metastatic disease. 3. 5 mm subpleural right middle lobe nodule, likely a benign incidental finding. Clinic notes indicate outside imaging demonstrating pulmonary nodules suspicious for metastatic disease. Direct comparison with outside imaging and/or continued follow  up suggested. 4. Mild sigmoid diverticulosis.   Electronically Signed  By: Richardean Sale M.D.  On: 11/20/2014 09:41    ASSESSMENT & PLAN:   Endometrial cancer 54 year old female with stage IV B endometrial adenocarcinoma. CT imaging at [facility] was suggestive of pulmonary metastatic disease. I reviewed her CT imaging with her today which realistically shows no evidence of disease. She has a 5 mm subpleural right middle lobe nodule felt to be benign. We will obtain copies of her scans from [facility] for direct comparison.  She is doing very well with therapy; she is due for cycle 4 today. I provided her with information in regards to  our classes and other resources for cancer patients here in [place]. I have advised her to call prior to her next follow-up with any problems or concerns.    No orders of the defined types were placed in this encounter.   All questions were answered. The patient knows to call the clinic with any problems, questions or concerns.   Molli Hazard, MD MD 11/29/2014 4:10 PM

## 2014-11-29 NOTE — Progress Notes (Signed)
Tolerated tx w/o adverse reaction. A&Ox4; in no distress; discharged ambulatory in c/o niece for transport home.

## 2014-11-29 NOTE — Assessment & Plan Note (Signed)
54 year old female with stage IV B endometrial adenocarcinoma. CT imaging at [facility] was suggestive of pulmonary metastatic disease. I reviewed her CT imaging with her today which realistically shows no evidence of disease. She has a 5 mm subpleural right middle lobe nodule felt to be benign. We will obtain copies of her scans from [facility] for direct comparison.  She is doing very well with therapy; she is due for cycle 4 today. I provided her with information in regards to our classes and other resources for cancer patients here in [place]. I have advised her to call prior to her next follow-up with any problems or concerns.

## 2014-11-30 ENCOUNTER — Encounter (HOSPITAL_BASED_OUTPATIENT_CLINIC_OR_DEPARTMENT_OTHER): Payer: Medicaid Other

## 2014-11-30 DIAGNOSIS — C541 Malignant neoplasm of endometrium: Secondary | ICD-10-CM

## 2014-11-30 DIAGNOSIS — Z5189 Encounter for other specified aftercare: Secondary | ICD-10-CM

## 2014-11-30 LAB — CA 125: CA 125: 18 U/mL (ref 0.0–34.0)

## 2014-11-30 MED ORDER — PEGFILGRASTIM INJECTION 6 MG/0.6ML ~~LOC~~
6.0000 mg | PREFILLED_SYRINGE | Freq: Once | SUBCUTANEOUS | Status: AC
  Administered 2014-11-30: 6 mg via SUBCUTANEOUS

## 2014-11-30 MED ORDER — PEGFILGRASTIM INJECTION 6 MG/0.6ML ~~LOC~~
PREFILLED_SYRINGE | SUBCUTANEOUS | Status: AC
  Filled 2014-11-30: qty 0.6

## 2014-11-30 NOTE — Patient Instructions (Signed)
[facility] at [facility] Discharge Instructions  RECOMMENDATIONS MADE BY THE CONSULTANT AND ANY TEST RESULTS WILL BE SENT TO YOUR REFERRING PHYSICIAN.  Neulasta injection today as ordered. Return as scheduled.  Thank you for choosing  Cancer Center at [facility] to provide your oncology and hematology care.  To afford each patient quality time with our provider, please arrive at least 15 minutes before your scheduled appointment time.    You need to re-schedule your appointment should you arrive 10 or more minutes late.  We strive to give you quality time with our providers, and arriving late affects you and other patients whose appointments are after yours.  Also, if you no show three or more times for appointments you may be dismissed from the clinic at the providers discretion.     Again, thank you for choosing [facility].  Our hope is that these requests will decrease the amount of time that you wait before being seen by our physicians.       _____________________________________________________________  Should you have questions after your visit to [facility], please contact our office at [phone] between the hours of 8:30 a.m. and 4:30 p.m.  Voicemails left after 4:30 p.m. will not be returned until the following business day.  For prescription refill requests, have your pharmacy contact our office.    

## 2014-11-30 NOTE — Progress Notes (Signed)
Julia Osborne presents today for injection per MD orders. Neulasta 6mg  administered SQ in right Abdomen. Administration without incident. Patient tolerated well.

## 2014-12-05 ENCOUNTER — Encounter: Payer: Medicaid Other | Admitting: Adult Health

## 2014-12-06 ENCOUNTER — Other Ambulatory Visit (HOSPITAL_COMMUNITY): Payer: Self-pay | Admitting: Oncology

## 2014-12-06 DIAGNOSIS — C541 Malignant neoplasm of endometrium: Secondary | ICD-10-CM

## 2014-12-06 MED ORDER — TRAMADOL HCL 50 MG PO TABS: 50.0000 mg | ORAL_TABLET | Freq: Four times a day (QID) | ORAL | Status: DC | PRN

## 2014-12-17 ENCOUNTER — Encounter: Payer: Medicaid Other | Admitting: Adult Health

## 2014-12-20 ENCOUNTER — Encounter (HOSPITAL_COMMUNITY): Payer: Self-pay

## 2014-12-20 ENCOUNTER — Encounter (HOSPITAL_BASED_OUTPATIENT_CLINIC_OR_DEPARTMENT_OTHER): Payer: Medicaid Other

## 2014-12-20 ENCOUNTER — Encounter (HOSPITAL_BASED_OUTPATIENT_CLINIC_OR_DEPARTMENT_OTHER): Payer: Medicaid Other | Admitting: Oncology

## 2014-12-20 DIAGNOSIS — R195 Other fecal abnormalities: Secondary | ICD-10-CM

## 2014-12-20 DIAGNOSIS — C541 Malignant neoplasm of endometrium: Secondary | ICD-10-CM

## 2014-12-20 DIAGNOSIS — Z5111 Encounter for antineoplastic chemotherapy: Secondary | ICD-10-CM

## 2014-12-20 DIAGNOSIS — M79606 Pain in leg, unspecified: Secondary | ICD-10-CM

## 2014-12-20 LAB — COMPREHENSIVE METABOLIC PANEL
ALK PHOS: 67 U/L (ref 39–117)
ALT: 23 U/L (ref 0–35)
AST: 18 U/L (ref 0–37)
Albumin: 4.1 g/dL (ref 3.5–5.2)
Anion gap: 8 (ref 5–15)
BUN: 15 mg/dL (ref 6–23)
CALCIUM: 9 mg/dL (ref 8.4–10.5)
CO2: 26 mmol/L (ref 19–32)
Chloride: 103 mmol/L (ref 96–112)
Creatinine, Ser: 0.74 mg/dL (ref 0.50–1.10)
GFR calc non Af Amer: >90 mL/min (ref >90)
Glucose, Bld: 83 mg/dL (ref 70–99)
POTASSIUM: 4.1 mmol/L (ref 3.5–5.1)
Sodium: 137 mmol/L (ref 135–145)
TOTAL PROTEIN: 7 g/dL (ref 6.0–8.3)
Total Bilirubin: 0.4 mg/dL (ref 0.3–1.2)

## 2014-12-20 LAB — CBC WITH DIFFERENTIAL/PLATELET
Basophils Absolute: 0 10*3/uL (ref 0.0–0.1)
Basophils Relative: 1 % (ref 0–1)
Eosinophils Absolute: 0.1 10*3/uL (ref 0.0–0.7)
Eosinophils Relative: 1 % (ref 0–5)
HCT: 33.5 % — ABNORMAL LOW (ref 36.0–46.0)
HEMOGLOBIN: 11.3 g/dL — AB (ref 12.0–15.0)
LYMPHS ABS: 1.5 10*3/uL (ref 0.7–4.0)
LYMPHS PCT: 25 % (ref 12–46)
MCH: 33.3 pg (ref 26.0–34.0)
MCHC: 33.7 g/dL (ref 30.0–36.0)
MCV: 98.8 fL (ref 78.0–100.0)
MONOS PCT: 17 % — AB (ref 3–12)
Monocytes Absolute: 1 10*3/uL (ref 0.1–1.0)
NEUTROS ABS: 3.3 10*3/uL (ref 1.7–7.7)
Neutrophils Relative %: 57 % (ref 43–77)
Platelets: 221 10*3/uL (ref 150–400)
RBC: 3.39 MIL/uL — AB (ref 3.87–5.11)
RDW: 14.5 % (ref 11.5–15.5)
WBC: 5.8 10*3/uL (ref 4.0–10.5)

## 2014-12-20 MED ORDER — FAMOTIDINE IN NACL 20-0.9 MG/50ML-% IV SOLN
INTRAVENOUS | Status: AC
  Filled 2014-12-20: qty 50

## 2014-12-20 MED ORDER — SODIUM CHLORIDE 0.9 % IV SOLN: 16.0000 mg | Freq: Once | INTRAVENOUS | Status: DC

## 2014-12-20 MED ORDER — DEXAMETHASONE SODIUM PHOSPHATE 10 MG/ML IJ SOLN: 20.0000 mg | Freq: Once | INTRAMUSCULAR | Status: DC

## 2014-12-20 MED ORDER — DIPHENHYDRAMINE HCL 50 MG/ML IJ SOLN
50.0000 mg | Freq: Once | INTRAMUSCULAR | Status: AC
  Administered 2014-12-20: 50 mg via INTRAVENOUS

## 2014-12-20 MED ORDER — PACLITAXEL CHEMO INJECTION 300 MG/50ML
175.0000 mg/m2 | Freq: Once | INTRAVENOUS | Status: AC
  Administered 2014-12-20: 300 mg via INTRAVENOUS
  Filled 2014-12-20: qty 50

## 2014-12-20 MED ORDER — HEPARIN SOD (PORK) LOCK FLUSH 100 UNIT/ML IV SOLN
500.0000 [IU] | Freq: Once | INTRAVENOUS | Status: AC | PRN
  Administered 2014-12-20: 500 [IU]
  Filled 2014-12-20: qty 5

## 2014-12-20 MED ORDER — DIPHENHYDRAMINE HCL 50 MG/ML IJ SOLN
INTRAMUSCULAR | Status: AC
  Filled 2014-12-20: qty 1

## 2014-12-20 MED ORDER — OXYCODONE-ACETAMINOPHEN 5-325 MG PO TABS: 1.0000 | ORAL_TABLET | ORAL | Status: DC | PRN

## 2014-12-20 MED ORDER — FAMOTIDINE IN NACL 20-0.9 MG/50ML-% IV SOLN
20.0000 mg | Freq: Once | INTRAVENOUS | Status: AC
Start: 2014-12-20 — End: 2014-12-20
  Administered 2014-12-20: 20 mg via INTRAVENOUS

## 2014-12-20 MED ORDER — SODIUM CHLORIDE 0.9 % IJ SOLN: 10.0000 mL | INTRAMUSCULAR | Status: DC | PRN

## 2014-12-20 MED ORDER — SODIUM CHLORIDE 0.9 % IV SOLN
Freq: Once | INTRAVENOUS | Status: AC
  Administered 2014-12-20: 09:00:00 via INTRAVENOUS

## 2014-12-20 MED ORDER — SODIUM CHLORIDE 0.9 % IV SOLN
Freq: Once | INTRAVENOUS | Status: AC
  Administered 2014-12-20: 16 mg via INTRAVENOUS
  Filled 2014-12-20: qty 8

## 2014-12-20 MED ORDER — SODIUM CHLORIDE 0.9 % IV SOLN
548.0000 mg | Freq: Once | INTRAVENOUS | Status: AC
  Administered 2014-12-20: 550 mg via INTRAVENOUS
  Filled 2014-12-20: qty 55

## 2014-12-20 NOTE — Assessment & Plan Note (Addendum)
54 year old female with stage IVB endometrial adenocarcinoma. CT imaging at [facility] was suggestive of pulmonary metastatic disease, but on review of her CT imaging performed on 11/20/2014, there is NED.  She does have a 5 mm subpleural right middle lobe nodule felt to be benign.   I have obtained copies of her scans from [facility] for direct comparison and I reviewed these in person with Dr. Register (Radiology).  She is tolerating therapy well; she is due for cycle 5 today.   She will return in 21 days for follow-up and for anticipated cycle 6 of therapy.  At that time, restaging scans will be ordered.  I have refilled her Percocet for Neulasta-induced leg pain.  I have ordered a stool testing for ova and parasite due to story above in HPI.    She is educated to follow good hand hygiene techniques and report to [facility] for any fevers or ED after business hours.

## 2014-12-20 NOTE — Patient Instructions (Signed)
[facility] Discharge Instructions for Patients Receiving Chemotherapy  Today you received the following chemotherapy agents:  Taxol and carboplatin  If you develop nausea and vomiting, or diarrhea that is not controlled by your medication, call the clinic.  The clinic phone number is [phone]. Office hours are Monday-Friday 8:30am-5:00pm.  BELOW ARE SYMPTOMS THAT SHOULD BE REPORTED IMMEDIATELY:  *FEVER GREATER THAN 101.0 F  *CHILLS WITH OR WITHOUT FEVER  NAUSEA AND VOMITING THAT IS NOT CONTROLLED WITH YOUR NAUSEA MEDICATION  *UNUSUAL SHORTNESS OF BREATH  *UNUSUAL BRUISING OR BLEEDING  TENDERNESS IN MOUTH AND THROAT WITH OR WITHOUT PRESENCE OF ULCERS  *URINARY PROBLEMS  *BOWEL PROBLEMS  UNUSUAL RASH Items with * indicate a potential emergency and should be followed up as soon as possible. If you have an emergency after office hours please contact your primary care physician or go to the nearest emergency department.  Please call the clinic during office hours if you have any questions or concerns.   You may also contact the Patient Navigator at [phone] should you have any questions or need assistance in obtaining follow up care. _____________________________________________________________________ Have you asked about our STAR program?    STAR stands for Survivorship Training and Rehabilitation, and this is a nationally recognized cancer care program that focuses on survivorship and rehabilitation.  Cancer and cancer treatments may cause problems, such as, pain, making you feel tired and keeping you from doing the things that you need or want to do. Cancer rehabilitation can help. Our goal is to reduce these troubling effects and help you have the best quality of life possible.  You may receive a survey from a nurse that asks questions about your current state of health.  Based on the survey results, all eligible patients will be referred to the STAR  program for an evaluation so we can better serve you! A frequently asked questions sheet is available upon request.           

## 2014-12-20 NOTE — Patient Instructions (Signed)
[place] at [facility] Discharge Instructions  RECOMMENDATIONS MADE BY THE CONSULTANT AND ANY TEST RESULTS WILL BE SENT TO YOUR REFERRING PHYSICIAN.  Exam and discussion by Robynn Pane, PA-C Want you to take your zofran at bedtime and every morning. Will refill your Oxycodone Monitor for fevers. Report uncontrolled nausea, vomiting or other problems.  Follow-up in 3 weeks.  Thank you for choosing [place] at [facility] to provide your oncology and hematology care.  To afford each patient quality time with our provider, please arrive at least 15 minutes before your scheduled appointment time.    You need to re-schedule your appointment should you arrive 10 or more minutes late.  We strive to give you quality time with our providers, and arriving late affects you and other patients whose appointments are after yours.  Also, if you no show three or more times for appointments you may be dismissed from the clinic at the providers discretion.     Again, thank you for choosing [facility].  Our hope is that these requests will decrease the amount of time that you wait before being seen by our physicians.       _____________________________________________________________  Should you have questions after your visit to [facility], please contact our office at [phone] between the hours of 8:30 a.m. and 4:30 p.m.  Voicemails left after 4:30 p.m. will not be returned until the following business day.  For prescription refill requests, have your pharmacy contact our office.

## 2014-12-20 NOTE — Progress Notes (Signed)
No primary care provider on file. No primary provider on file.  Endometrial cancer - Plan: oxyCODONE-acetaminophen (PERCOCET/ROXICET) 5-325 MG per tablet  Change in stool - Plan: Ova and Parasite Examination  CURRENT THERAPY: Carboplatin/Taxol.  INTERVAL HISTORY: Julia Osborne 54 y.o. female returns for followup of stage IV endometrial cancer.     Endometrial cancer   07/12/2014 Imaging CT C/A/P, pelvic adenopathy, multiple pulmonary nodules concerning for metastatic disease   07/30/2014 Initial Diagnosis Endometrial cancer   07/31/2014 Pathology Results endometrioid adenocarcinoma, G2, lymphovascular invasion present, positive pelvic lymph node and vaginal biopsy   07/31/2014 Definitive Surgery radical hysterectomy, BSO, pelvic lymph node dissection and vaginal biopsies   09/25/2014 Procedure Port-A-Cath placement in IR   10/18/2014 -  Chemotherapy Carboplatin/Taxol. First cycle given at [facility].    I personally reviewed and went over laboratory results with the patient.  The results are noted within this dictation.  I personally reviewed and went over radiographic studies with the patient.  The results are noted within this dictation.  I went down to radiology department and met with Radiologist today to review her imaging at [facility].  They were unable to load the disk on their computer system to review, but he did review the most recent CT performed within the [facility].  He agrees with the reading of that exam.  She reports occasional AM vomiting about 1-3 days pout from chemotherapy.  She blames it on the Neulasta injection, but it is more likely secondary to chemotherapy.  I recommended taking a Zofran at HS and again first thing in the AM with or without light fare.  This is recommended in hopes of aborting an episode of vomiting.    She notes some chills and aches, but denies any fevers at home.  She denies a cough, sputum production, sore throat, etc.  Her family  member works at the school and recently came down with the "stomach bug."  This may be related.  I will await lab work before proceeding with chemotherapy today.  She reports a single episode of a BM with a long "thing" attached to the stool.  She denies any identification of undigested food products in/on the stool.  She reports that "it did not look like food."  "Do you think it was a tapeworm?"  She reports that her daughter's dog is being treated for parasites.  Due to her concern, and her findings in her stool, it would be reasonable to perform a stool sample for ova and parasites.  This has been ordered.  Otherwise, oncologically, she denies any complaints and ROS questioning is negative.  Past Medical History  Diagnosis Date  . Asthma   . Uterine fibroid   . Cancer     endometrial  . COPD (chronic obstructive pulmonary disease)     no definite diagnosis    has Aphasia; Conversion reaction; Depression; and Endometrial cancer on her problem list.     is allergic to codeine.  Ms. Richens had no medications administered during this visit.  Past Surgical History  Procedure Laterality Date  . Tubal ligation    . Appendectomy    . Abdominal hysterectomy    . Portacath placement Right 09/2014    Denies any headaches, dizziness, double vision, fevers, night sweats, diarrhea, constipation, chest pain, heart palpitations, shortness of breath, blood in stool, black tarry stool, urinary pain, urinary burning, urinary frequency, hematuria.   PHYSICAL EXAMINATION  ECOG PERFORMANCE STATUS: 1 -  Symptomatic but completely ambulatory  There were no vitals filed for this visit.  GENERAL:alert, no distress, well nourished, well developed, comfortable, cooperative and smiling, in chemotherapy recliner. SKIN: skin color, texture, turgor are normal, no rashes or significant lesions HEAD: Normocephalic, No masses, lesions, tenderness or abnormalities EYES: normal, PERRLA, EOMI, Conjunctiva are  pink and non-injected EARS: External ears normal OROPHARYNX:lips, buccal mucosa, and tongue normal and mucous membranes are moist  NECK: supple, no adenopathy, thyroid normal size, non-tender, without nodularity, no stridor, non-tender, trachea midline LYMPH:  no palpable lymphadenopathy BREAST:not examined LUNGS: clear to auscultation  HEART: regular rate & rhythm, no murmurs and no gallops ABDOMEN:abdomen soft, non-tender and normal bowel sounds BACK: Back symmetric, no curvature., No CVA tenderness EXTREMITIES:less then 2 second capillary refill, no joint deformities, effusion, or inflammation, no skin discoloration, no clubbing, no cyanosis  NEURO: alert & oriented x 3 with fluent speech, no focal motor/sensory deficits    LABORATORY DATA: CBC    Component Value Date/Time   WBC 5.8 12/20/2014 0900   RBC 3.39* 12/20/2014 0900   HGB 11.3* 12/20/2014 0900   HCT 33.5* 12/20/2014 0900   PLT 221 12/20/2014 0900   MCV 98.8 12/20/2014 0900   MCH 33.3 12/20/2014 0900   MCHC 33.7 12/20/2014 0900   RDW 14.5 12/20/2014 0900   LYMPHSABS 1.5 12/20/2014 0900   MONOABS 1.0 12/20/2014 0900   EOSABS 0.1 12/20/2014 0900   BASOSABS 0.0 12/20/2014 0900      Chemistry      Component Value Date/Time   NA 136 11/29/2014 0925   K 4.1 11/29/2014 0925   CL 105 11/29/2014 0925   CO2 24 11/29/2014 0925   BUN 15 11/29/2014 0925   CREATININE 0.80 11/29/2014 0925      Component Value Date/Time   CALCIUM 8.8 11/29/2014 0925   ALKPHOS 57 11/29/2014 0925   AST 17 11/29/2014 0925   ALT 16 11/29/2014 0925   BILITOT 0.5 11/29/2014 0925     Lab Results  Component Value Date   CA125 18.0 11/29/2014     ASSESSMENT AND PLAN:  Endometrial cancer 54 year old female with stage IVB endometrial adenocarcinoma. CT imaging at [facility] was suggestive of pulmonary metastatic disease, but on review of her CT imaging performed on 11/20/2014, there is NED.  She does have a 5 mm subpleural right middle lobe  nodule felt to be benign.   I have obtained copies of her scans from [facility] for direct comparison and I reviewed these in person with Dr. Register (Radiology).  She is tolerating therapy well; she is due for cycle 5 today.   She will return in 21 days for follow-up and for anticipated cycle 6 of therapy.  At that time, restaging scans will be ordered.  I have refilled her Percocet for Neulasta-induced leg pain.  I have ordered a stool testing for ova and parasite due to story above in HPI.    She is educated to follow good hand hygiene techniques and report to [facility] for any fevers or ED after business hours.     THERAPY PLAN:  Continue with therapy as planned.  Suspect 6 cycles will complete her therapy.  Will get her set-up for restaging scans when she returns for fdollow-up and to start cycle 6 of therapy.   All questions were answered. The patient knows to call the clinic with any problems, questions or concerns. We can certainly see the patient much sooner if necessary.  Patient and plan discussed with Dr. Larene Beach  [place] and she is in agreement with the aforementioned.   This note is electronically signed by: Robynn Pane 12/20/2014 9:43 AM

## 2014-12-21 ENCOUNTER — Encounter (HOSPITAL_COMMUNITY): Payer: Medicaid Other | Attending: Hematology & Oncology

## 2014-12-21 ENCOUNTER — Encounter (HOSPITAL_COMMUNITY): Payer: Self-pay

## 2014-12-21 DIAGNOSIS — Z5189 Encounter for other specified aftercare: Secondary | ICD-10-CM

## 2014-12-21 DIAGNOSIS — J449 Chronic obstructive pulmonary disease, unspecified: Secondary | ICD-10-CM | POA: Insufficient documentation

## 2014-12-21 DIAGNOSIS — D259 Leiomyoma of uterus, unspecified: Secondary | ICD-10-CM | POA: Insufficient documentation

## 2014-12-21 DIAGNOSIS — C541 Malignant neoplasm of endometrium: Secondary | ICD-10-CM | POA: Insufficient documentation

## 2014-12-21 DIAGNOSIS — J45909 Unspecified asthma, uncomplicated: Secondary | ICD-10-CM | POA: Insufficient documentation

## 2014-12-21 LAB — CA 125: CA 125: 16 U/mL (ref 0.0–34.0)

## 2014-12-21 MED ORDER — PEGFILGRASTIM INJECTION 6 MG/0.6ML ~~LOC~~: 6.0000 mg | PREFILLED_SYRINGE | Freq: Once | SUBCUTANEOUS | Status: DC

## 2014-12-21 MED ORDER — PEGFILGRASTIM INJECTION 6 MG/0.6ML ~~LOC~~
PREFILLED_SYRINGE | SUBCUTANEOUS | Status: AC
  Filled 2014-12-21: qty 0.6

## 2014-12-21 MED ORDER — PEGFILGRASTIM INJECTION 6 MG/0.6ML
6.0000 mg | Freq: Once | SUBCUTANEOUS | Status: DC
  Administered 2014-12-21: 6 mg via SUBCUTANEOUS

## 2014-12-21 NOTE — Patient Instructions (Signed)
[place] at [facility] Discharge Instructions  RECOMMENDATIONS MADE BY THE CONSULTANT AND ANY TEST RESULTS WILL BE SENT TO YOUR REFERRING PHYSICIAN.  You received your neulasta shot today. Call for any questions or concerns. See you at your next appt.  Thank you for choosing [place] at [facility] to provide your oncology and hematology care.  To afford each patient quality time with our provider, please arrive at least 15 minutes before your scheduled appointment time.    You need to re-schedule your appointment should you arrive 10 or more minutes late.  We strive to give you quality time with our providers, and arriving late affects you and other patients whose appointments are after yours.  Also, if you no show three or more times for appointments you may be dismissed from the clinic at the providers discretion.     Again, thank you for choosing [facility].  Our hope is that these requests will decrease the amount of time that you wait before being seen by our physicians.       _____________________________________________________________  Should you have questions after your visit to [facility], please contact our office at [phone] between the hours of 8:30 a.m. and 4:30 p.m.  Voicemails left after 4:30 p.m. will not be returned until the following business day.  For prescription refill requests, have your pharmacy contact our office.

## 2014-12-21 NOTE — Progress Notes (Signed)
Julia Osborne presents today for injection per MD orders. Neulasta 6mg  administered SQ in right Abdomen. Administration without incident. Patient tolerated well.

## 2014-12-28 ENCOUNTER — Encounter: Payer: Self-pay | Admitting: Adult Health

## 2014-12-28 ENCOUNTER — Ambulatory Visit (INDEPENDENT_AMBULATORY_CARE_PROVIDER_SITE_OTHER): Payer: Medicaid Other | Admitting: Adult Health

## 2014-12-28 ENCOUNTER — Other Ambulatory Visit (HOSPITAL_COMMUNITY)
Admission: RE | Admit: 2014-12-28 | Discharge: 2014-12-28 | Disposition: A | Discharge status: Home / Self Care | Payer: Medicaid Other | Source: Ambulatory Visit | Attending: Adult Health | Admitting: Adult Health

## 2014-12-28 ENCOUNTER — Other Ambulatory Visit: Payer: Self-pay | Admitting: Adult Health

## 2014-12-28 VITALS — BP 128/74 | HR 88 | Ht 63.5 in | Wt 146.5 lb

## 2014-12-28 DIAGNOSIS — Z1151 Encounter for screening for human papillomavirus (HPV): Secondary | ICD-10-CM | POA: Diagnosis present

## 2014-12-28 DIAGNOSIS — Z01419 Encounter for gynecological examination (general) (routine) without abnormal findings: Secondary | ICD-10-CM | POA: Insufficient documentation

## 2014-12-28 DIAGNOSIS — Z1231 Encounter for screening mammogram for malignant neoplasm of breast: Secondary | ICD-10-CM

## 2014-12-28 DIAGNOSIS — Z8542 Personal history of malignant neoplasm of other parts of uterus: Secondary | ICD-10-CM

## 2014-12-28 DIAGNOSIS — Z1212 Encounter for screening for malignant neoplasm of rectum: Secondary | ICD-10-CM

## 2014-12-28 DIAGNOSIS — Z139 Encounter for screening, unspecified: Secondary | ICD-10-CM

## 2014-12-28 DIAGNOSIS — Z0000 Encounter for general adult medical examination without abnormal findings: Secondary | ICD-10-CM | POA: Diagnosis not present

## 2014-12-28 LAB — HEMOCCULT GUIAC POC 1CARD (OFFICE): FECAL OCCULT BLD: NEGATIVE

## 2014-12-28 NOTE — Progress Notes (Signed)
Patient ID: [name], female   DOB: 25-Oct-1960, 54 y.o.   MRN: [id number] History of Present Illness: Julia Osborne is a 54 year old white female, new to this practice, in for well woman gyn exam and pap.She had endometrial cancer with TAH in November 2015 by Dr Maia Petties.She is getting chemo at [facility] and sees Dr Whitney Muse.   Current Medications, Allergies, Past Medical History, Past Surgical History, Family History and Social History were reviewed in [employer] record.     Review of Systems: Patient denies any hearing loss, shortness of breath, chest pain, abdominal pain, problems with bowel movements, urination, or intercourse(not having sex). No joint pain or mood swings.Has more headaches since chemo, and is tired at times, has indigestion and prilosec not helping. Has pain in right breast at times, port a cath on that side.Last chemo 4/20, if all goes well.Has decreased cigarettes to where 1 pack lasts all week,she is getting apartment at [facility].    Physical Exam:BP 128/74 mmHg  Pulse 88  Ht 5' 3.5" (1.613 m)  Wt 146 lb 8 oz (66.452 kg)  BMI 25.54 kg/m2 General:  Well developed, well nourished, no acute distress Skin:  Warm and dry Neck:  Midline trachea, normal thyroid, good ROM, no lymphadenopathy Lungs; Clear to auscultation bilaterally Breast:  No dominant palpable mass, retraction, or nipple discharge, port a cath in place right chest Cardiovascular: Regular rate and rhythm Abdomen:  Soft, non tender, no hepatosplenomegaly, well healed vertical incision Pelvic:  External genitalia is normal in appearance, no lesions.  The vagina is atrophic.The cervix and uterus are absent and vaginal cuff well healed, pap wit HPV obtained.  No adnexal masses or tenderness noted.Bladder is non tender, no masses felt. Rectal: Good sphincter tone, no polyps, or hemorrhoids felt.  Hemoccult negative. Extremities/musculoskeletal:  No swelling or varicosities noted, no clubbing or  cyanosis Psych:  No mood changes, alert and cooperative,seems happy Discussed getting mammogram has never had one and getting colonoscopy las one over 16 years ago.  Impression: Well woman gyn exam with pap History of endometrial cancer    Plan: Mammogram scheduled for her at [facility] 5/12 at 9:30 am Referred to Dr Gala Romney for colonoscopy Pap in 6 months, physical in 1 year

## 2014-12-28 NOTE — Patient Instructions (Addendum)
Mammogram 5/12 at 9:30 Physical in 1 year, pap in 6 months Referred to dr Gala Romney for colonoscopy

## 2014-12-31 LAB — CYTOLOGY - PAP

## 2015-01-02 ENCOUNTER — Telehealth: Payer: Self-pay

## 2015-01-02 NOTE — Telephone Encounter (Signed)
[phone]  CALL PATIENT TO SCHEDULE A COLONOSCOPY

## 2015-01-03 NOTE — Telephone Encounter (Signed)
I called to triage pt for colonoscopy. She was referred by Derrek Monaco, NP.  Pt said she is battling Indometrial Cancer and is on chemo. She is having a lot of issues with indigestion and heartburn.  Scheduled for OV with Neil Crouch, PA on 01/22/2015 at 9:30 AM.

## 2015-01-04 ENCOUNTER — Other Ambulatory Visit (HOSPITAL_COMMUNITY): Payer: Self-pay | Admitting: Oncology

## 2015-01-04 DIAGNOSIS — C541 Malignant neoplasm of endometrium: Secondary | ICD-10-CM

## 2015-01-04 MED ORDER — TRAMADOL HCL 50 MG PO TABS: 50.0000 mg | ORAL_TABLET | Freq: Four times a day (QID) | ORAL | Status: DC | PRN

## 2015-01-09 ENCOUNTER — Other Ambulatory Visit (HOSPITAL_COMMUNITY): Payer: Self-pay | Admitting: Oncology

## 2015-01-09 ENCOUNTER — Encounter (HOSPITAL_BASED_OUTPATIENT_CLINIC_OR_DEPARTMENT_OTHER): Payer: Medicaid Other

## 2015-01-09 ENCOUNTER — Encounter (HOSPITAL_COMMUNITY): Payer: Self-pay | Admitting: Hematology & Oncology

## 2015-01-09 ENCOUNTER — Telehealth (HOSPITAL_COMMUNITY): Payer: Self-pay | Admitting: *Deleted

## 2015-01-09 ENCOUNTER — Encounter (HOSPITAL_BASED_OUTPATIENT_CLINIC_OR_DEPARTMENT_OTHER): Payer: Medicaid Other | Admitting: Hematology & Oncology

## 2015-01-09 ENCOUNTER — Ambulatory Visit (HOSPITAL_COMMUNITY): Payer: Self-pay | Admitting: Hematology & Oncology

## 2015-01-09 DIAGNOSIS — J45909 Unspecified asthma, uncomplicated: Secondary | ICD-10-CM | POA: Diagnosis not present

## 2015-01-09 DIAGNOSIS — J449 Chronic obstructive pulmonary disease, unspecified: Secondary | ICD-10-CM | POA: Diagnosis not present

## 2015-01-09 DIAGNOSIS — Z5111 Encounter for antineoplastic chemotherapy: Secondary | ICD-10-CM

## 2015-01-09 DIAGNOSIS — C541 Malignant neoplasm of endometrium: Secondary | ICD-10-CM

## 2015-01-09 DIAGNOSIS — D259 Leiomyoma of uterus, unspecified: Secondary | ICD-10-CM | POA: Diagnosis not present

## 2015-01-09 LAB — CBC WITH DIFFERENTIAL/PLATELET
BASOS PCT: 0 % (ref 0–1)
Basophils Absolute: 0 10*3/uL (ref 0.0–0.1)
EOS PCT: 1 % (ref 0–5)
Eosinophils Absolute: 0 10*3/uL (ref 0.0–0.7)
HCT: 31.9 % — ABNORMAL LOW (ref 36.0–46.0)
Hemoglobin: 10.8 g/dL — ABNORMAL LOW (ref 12.0–15.0)
Lymphocytes Relative: 30 % (ref 12–46)
Lymphs Abs: 1.7 10*3/uL (ref 0.7–4.0)
MCH: 33.8 pg (ref 26.0–34.0)
MCHC: 33.9 g/dL (ref 30.0–36.0)
MCV: 99.7 fL (ref 78.0–100.0)
MONO ABS: 1.2 10*3/uL — AB (ref 0.1–1.0)
Monocytes Relative: 22 % — ABNORMAL HIGH (ref 3–12)
NEUTROS ABS: 2.6 10*3/uL (ref 1.7–7.7)
Neutrophils Relative %: 47 % (ref 43–77)
Platelets: 212 10*3/uL (ref 150–400)
RBC: 3.2 MIL/uL — ABNORMAL LOW (ref 3.87–5.11)
RDW: 14.2 % (ref 11.5–15.5)
WBC: 5.6 10*3/uL (ref 4.0–10.5)

## 2015-01-09 LAB — COMPREHENSIVE METABOLIC PANEL
ALBUMIN: 3.9 g/dL (ref 3.5–5.2)
ALT: 44 U/L — AB (ref 0–35)
AST: 28 U/L (ref 0–37)
Alkaline Phosphatase: 90 U/L (ref 39–117)
Anion gap: 7 (ref 5–15)
BUN: 13 mg/dL (ref 6–23)
CO2: 26 mmol/L (ref 19–32)
CREATININE: 0.7 mg/dL (ref 0.50–1.10)
Calcium: 8.9 mg/dL (ref 8.4–10.5)
Chloride: 105 mmol/L (ref 96–112)
GFR calc Af Amer: >90 mL/min (ref >90)
GFR calc non Af Amer: >90 mL/min (ref >90)
Glucose, Bld: 86 mg/dL (ref 70–99)
Potassium: 4 mmol/L (ref 3.5–5.1)
SODIUM: 138 mmol/L (ref 135–145)
Total Bilirubin: 0.4 mg/dL (ref 0.3–1.2)
Total Protein: 7 g/dL (ref 6.0–8.3)

## 2015-01-09 MED ORDER — SODIUM CHLORIDE 0.9 % IV SOLN
Freq: Once | INTRAVENOUS | Status: AC
  Administered 2015-01-09: 10:00:00 via INTRAVENOUS

## 2015-01-09 MED ORDER — DEXAMETHASONE SODIUM PHOSPHATE 10 MG/ML IJ SOLN: 20.0000 mg | Freq: Once | INTRAMUSCULAR | Status: DC

## 2015-01-09 MED ORDER — FAMOTIDINE IN NACL 20-0.9 MG/50ML-% IV SOLN
20.0000 mg | Freq: Once | INTRAVENOUS | Status: AC
Start: 2015-01-09 — End: 2015-01-09
  Administered 2015-01-09: 20 mg via INTRAVENOUS
  Filled 2015-01-09: qty 50

## 2015-01-09 MED ORDER — HEPARIN SOD (PORK) LOCK FLUSH 100 UNIT/ML IV SOLN
500.0000 [IU] | Freq: Once | INTRAVENOUS | Status: AC | PRN
  Administered 2015-01-09: 500 [IU]

## 2015-01-09 MED ORDER — OXYCODONE-ACETAMINOPHEN 5-325 MG PO TABS: 1.0000 | ORAL_TABLET | ORAL | Status: DC | PRN

## 2015-01-09 MED ORDER — ONDANSETRON HCL 40 MG/20ML IJ SOLN
Freq: Once | INTRAMUSCULAR | Status: AC
  Administered 2015-01-09: 16 mg via INTRAVENOUS
  Filled 2015-01-09: qty 8

## 2015-01-09 MED ORDER — SODIUM CHLORIDE 0.9 % IV SOLN: 16.0000 mg | Freq: Once | INTRAVENOUS | Status: DC

## 2015-01-09 MED ORDER — DIPHENHYDRAMINE HCL 50 MG/ML IJ SOLN
50.0000 mg | Freq: Once | INTRAMUSCULAR | Status: AC
  Administered 2015-01-09: 50 mg via INTRAVENOUS
  Filled 2015-01-09: qty 1

## 2015-01-09 MED ORDER — PACLITAXEL CHEMO INJECTION 300 MG/50ML
175.0000 mg/m2 | Freq: Once | INTRAVENOUS | Status: AC
  Administered 2015-01-09: 300 mg via INTRAVENOUS
  Filled 2015-01-09: qty 50

## 2015-01-09 MED ORDER — SODIUM CHLORIDE 0.9 % IV SOLN
548.0000 mg | Freq: Once | INTRAVENOUS | Status: AC
  Administered 2015-01-09: 550 mg via INTRAVENOUS
  Filled 2015-01-09: qty 55

## 2015-01-09 MED ORDER — SODIUM CHLORIDE 0.9 % IJ SOLN: 10.0000 mL | INTRAMUSCULAR | Status: DC | PRN

## 2015-01-09 NOTE — Patient Instructions (Signed)
..  [place] at [facility] Discharge Instructions  RECOMMENDATIONS MADE BY THE CONSULTANT AND ANY TEST RESULTS WILL BE SENT TO YOUR REFERRING PHYSICIAN.  Exam per Dr. Youlanda Roys will return to see the Dr. In 1 month  We need to flush your port every 6-8 weeks  Thank you for choosing [place] at [facility] to provide your oncology and hematology care.  To afford each patient quality time with our provider, please arrive at least 15 minutes before your scheduled appointment time.    You need to re-schedule your appointment should you arrive 10 or more minutes late.  We strive to give you quality time with our providers, and arriving late affects you and other patients whose appointments are after yours.  Also, if you no show three or more times for appointments you may be dismissed from the clinic at the providers discretion.     Again, thank you for choosing [facility].  Our hope is that these requests will decrease the amount of time that you wait before being seen by our physicians.       _____________________________________________________________  Should you have questions after your visit to [facility], please contact our office at [phone] between the hours of 8:30 a.m. and 4:30 p.m.  Voicemails left after 4:30 p.m. will not be returned until the following business day.  For prescription refill requests, have your pharmacy contact our office.

## 2015-01-09 NOTE — Patient Instructions (Signed)
[facility] Discharge Instructions for Patients Receiving Chemotherapy  Today you received the following chemotherapy agents:  Taxol and carboplatin Return as scheduled tomorrow for Neulasta injection.   If you develop nausea and vomiting, or diarrhea that is not controlled by your medication, call the clinic.  The clinic phone number is [phone]. Office hours are Monday-Friday 8:30am-5:00pm.  BELOW ARE SYMPTOMS THAT SHOULD BE REPORTED IMMEDIATELY:  *FEVER GREATER THAN 101.0 F  *CHILLS WITH OR WITHOUT FEVER  NAUSEA AND VOMITING THAT IS NOT CONTROLLED WITH YOUR NAUSEA MEDICATION  *UNUSUAL SHORTNESS OF BREATH  *UNUSUAL BRUISING OR BLEEDING  TENDERNESS IN MOUTH AND THROAT WITH OR WITHOUT PRESENCE OF ULCERS  *URINARY PROBLEMS  *BOWEL PROBLEMS  UNUSUAL RASH Items with * indicate a potential emergency and should be followed up as soon as possible. If you have an emergency after office hours please contact your primary care physician or go to the nearest emergency department.  Please call the clinic during office hours if you have any questions or concerns.   You may also contact the Patient Navigator at [phone] should you have any questions or need assistance in obtaining follow up care. _____________________________________________________________________ Have you asked about our STAR program?    STAR stands for Survivorship Training and Rehabilitation, and this is a nationally recognized cancer care program that focuses on survivorship and rehabilitation.  Cancer and cancer treatments may cause problems, such as, pain, making you feel tired and keeping you from doing the things that you need or want to do. Cancer rehabilitation can help. Our goal is to reduce these troubling effects and help you have the best quality of life possible.  You may receive a survey from a nurse that asks questions about your current state of health.  Based on the survey  results, all eligible patients will be referred to the [facility] program for an evaluation so we can better serve you! A frequently asked questions sheet is available upon request.

## 2015-01-09 NOTE — Telephone Encounter (Signed)
Ready for pick up

## 2015-01-09 NOTE — Progress Notes (Signed)
1515:  Tolerated tx w/o adverse reaction.  A&Ox4; in no distress.  Discharged ambulatory in c/o niece for transport home.

## 2015-01-10 ENCOUNTER — Encounter (HOSPITAL_COMMUNITY): Payer: Self-pay

## 2015-01-10 ENCOUNTER — Encounter (HOSPITAL_BASED_OUTPATIENT_CLINIC_OR_DEPARTMENT_OTHER): Payer: Medicaid Other

## 2015-01-10 VITALS — BP 142/84 | HR 82 | Temp 98.4°F | Resp 16

## 2015-01-10 DIAGNOSIS — C541 Malignant neoplasm of endometrium: Secondary | ICD-10-CM

## 2015-01-10 DIAGNOSIS — Z5189 Encounter for other specified aftercare: Secondary | ICD-10-CM | POA: Diagnosis not present

## 2015-01-10 LAB — CA 125: CA 125: 15.4 U/mL (ref 0.0–34.0)

## 2015-01-10 MED ORDER — PEGFILGRASTIM INJECTION 6 MG/0.6ML ~~LOC~~
6.0000 mg | PREFILLED_SYRINGE | Freq: Once | SUBCUTANEOUS | Status: AC
  Administered 2015-01-10: 6 mg via SUBCUTANEOUS

## 2015-01-10 NOTE — Progress Notes (Signed)
..  Julia Osborne presents today for injection per the provider's orders.  Neulasta administration without incident; see MAR for injection details.  Patient tolerated procedure well and without incident.  No questions or complaints noted at this time.

## 2015-01-22 ENCOUNTER — Encounter: Payer: Self-pay | Admitting: Gastroenterology

## 2015-01-22 ENCOUNTER — Telehealth: Payer: Self-pay | Admitting: Gastroenterology

## 2015-01-22 ENCOUNTER — Ambulatory Visit: Payer: Self-pay | Admitting: Gastroenterology

## 2015-01-22 NOTE — Telephone Encounter (Signed)
PATIENT WAS A NO SHOW AND LETTER WAS SENT  °

## 2015-01-31 ENCOUNTER — Ambulatory Visit (HOSPITAL_COMMUNITY): Payer: Self-pay

## 2015-02-08 ENCOUNTER — Other Ambulatory Visit (HOSPITAL_COMMUNITY): Payer: Self-pay

## 2015-02-08 ENCOUNTER — Encounter (HOSPITAL_COMMUNITY): Payer: Medicaid Other

## 2015-02-08 ENCOUNTER — Encounter (HOSPITAL_COMMUNITY): Payer: Self-pay | Admitting: Hematology & Oncology

## 2015-02-08 ENCOUNTER — Encounter (HOSPITAL_COMMUNITY): Payer: Medicaid Other | Attending: Hematology & Oncology | Admitting: Hematology & Oncology

## 2015-02-08 VITALS — BP 128/61 | HR 76 | Temp 98.2°F | Resp 18 | Wt 153.7 lb

## 2015-02-08 DIAGNOSIS — F419 Anxiety disorder, unspecified: Secondary | ICD-10-CM | POA: Diagnosis not present

## 2015-02-08 DIAGNOSIS — D259 Leiomyoma of uterus, unspecified: Secondary | ICD-10-CM | POA: Diagnosis not present

## 2015-02-08 DIAGNOSIS — C541 Malignant neoplasm of endometrium: Secondary | ICD-10-CM | POA: Insufficient documentation

## 2015-02-08 DIAGNOSIS — R300 Dysuria: Secondary | ICD-10-CM

## 2015-02-08 DIAGNOSIS — J449 Chronic obstructive pulmonary disease, unspecified: Secondary | ICD-10-CM | POA: Diagnosis not present

## 2015-02-08 DIAGNOSIS — J45909 Unspecified asthma, uncomplicated: Secondary | ICD-10-CM | POA: Diagnosis not present

## 2015-02-08 DIAGNOSIS — R1110 Vomiting, unspecified: Secondary | ICD-10-CM

## 2015-02-08 DIAGNOSIS — Z95828 Presence of other vascular implants and grafts: Secondary | ICD-10-CM

## 2015-02-08 LAB — URINALYSIS, ROUTINE W REFLEX MICROSCOPIC
Bilirubin Urine: NEGATIVE
Glucose, UA: NEGATIVE mg/dL
Ketones, ur: NEGATIVE mg/dL
LEUKOCYTES UA: NEGATIVE
Nitrite: NEGATIVE
Protein, ur: NEGATIVE mg/dL
Specific Gravity, Urine: <1.005 — ABNORMAL LOW (ref 1.005–1.030)
UROBILINOGEN UA: 0.2 mg/dL (ref 0.0–1.0)
pH: 6 (ref 5.0–8.0)

## 2015-02-08 LAB — CBC WITH DIFFERENTIAL/PLATELET
BASOS ABS: 0 10*3/uL (ref 0.0–0.1)
BASOS PCT: 0 % (ref 0–1)
Eosinophils Absolute: 0.1 10*3/uL (ref 0.0–0.7)
Eosinophils Relative: 2 % (ref 0–5)
HEMATOCRIT: 35 % — AB (ref 36.0–46.0)
Hemoglobin: 11.8 g/dL — ABNORMAL LOW (ref 12.0–15.0)
Lymphocytes Relative: 30 % (ref 12–46)
Lymphs Abs: 1.8 10*3/uL (ref 0.7–4.0)
MCH: 33.5 pg (ref 26.0–34.0)
MCHC: 33.7 g/dL (ref 30.0–36.0)
MCV: 99.4 fL (ref 78.0–100.0)
MONO ABS: 0.7 10*3/uL (ref 0.1–1.0)
Monocytes Relative: 11 % (ref 3–12)
NEUTROS ABS: 3.4 10*3/uL (ref 1.7–7.7)
Neutrophils Relative %: 57 % (ref 43–77)
Platelets: 260 10*3/uL (ref 150–400)
RBC: 3.52 MIL/uL — ABNORMAL LOW (ref 3.87–5.11)
RDW: 14.5 % (ref 11.5–15.5)
WBC: 6 10*3/uL (ref 4.0–10.5)

## 2015-02-08 LAB — COMPREHENSIVE METABOLIC PANEL
ALK PHOS: 76 U/L (ref 38–126)
ALT: 39 U/L (ref 14–54)
AST: 24 U/L (ref 15–41)
Albumin: 4.2 g/dL (ref 3.5–5.0)
Anion gap: 8 (ref 5–15)
BILIRUBIN TOTAL: 0.6 mg/dL (ref 0.3–1.2)
BUN: 13 mg/dL (ref 6–20)
CHLORIDE: 105 mmol/L (ref 101–111)
CO2: 27 mmol/L (ref 22–32)
Calcium: 9.1 mg/dL (ref 8.9–10.3)
Creatinine, Ser: 0.71 mg/dL (ref 0.44–1.00)
GFR calc Af Amer: >60 mL/min (ref >60)
GFR calc non Af Amer: >60 mL/min (ref >60)
Glucose, Bld: 73 mg/dL (ref 65–99)
POTASSIUM: 4.3 mmol/L (ref 3.5–5.1)
Sodium: 140 mmol/L (ref 135–145)
Total Protein: 7.4 g/dL (ref 6.5–8.1)

## 2015-02-08 LAB — URINE MICROSCOPIC-ADD ON

## 2015-02-08 MED ORDER — HEPARIN SOD (PORK) LOCK FLUSH 100 UNIT/ML IV SOLN
500.0000 [IU] | Freq: Once | INTRAVENOUS | Status: AC
  Administered 2015-02-08: 500 [IU] via INTRAVENOUS
  Filled 2015-02-08: qty 5

## 2015-02-08 MED ORDER — HEPARIN SOD (PORK) LOCK FLUSH 100 UNIT/ML IV SOLN
INTRAVENOUS | Status: AC
  Filled 2015-02-08: qty 5

## 2015-02-08 MED ORDER — SODIUM CHLORIDE 0.9 % IJ SOLN
10.0000 mL | INTRAMUSCULAR | Status: DC | PRN
  Administered 2015-02-08: 10 mL via INTRAVENOUS
  Filled 2015-02-08: qty 10

## 2015-02-08 NOTE — Progress Notes (Signed)
See md visit for port flush documentation

## 2015-02-08 NOTE — Progress Notes (Signed)
[place] CONSULT NOTE  CHIEF COMPLAINTS/PURPOSE OF CONSULTATION:  Stage IV Endometrial Cancer, probable pulmonary metastases. Radical hysterectomy, BSO, pelvic LN dissection and vaginal biopsies 07/31/2014, patient had extensive pelvic disease including positive LN and positive distal vaginal biopsy  CA-125 137.41 on 07/19/2014 CA-125 25.72 on 09/27/2014  (normal range 0-20.9U/ml)  HISTORY OF PRESENTING ILLNESS:   Julia Osborne 54 y.o. female is here because of stage IV endometrial cancer. She has completed all therapy. She has recently developed episodes of vomiting in the morning, "white and foamy". Eating and sleeping patterns are normal. Urine has a strong odor and experiencing lower back pain. Medications, daily Omeprozole. Anxiety has improved since living alone. Nausea reported every day, self treated with Zofran. Smoking habits consist of 10 cigarettes a day with some vaping.    Endometrial cancer   07/12/2014 Imaging CT C/A/P, pelvic adenopathy, multiple pulmonary nodules concerning for metastatic disease   07/30/2014 Initial Diagnosis Endometrial cancer   07/31/2014 Pathology Results endometrioid adenocarcinoma, G2, lymphovascular invasion present, positive pelvic lymph node and vaginal biopsy   07/31/2014 Definitive Surgery radical hysterectomy, BSO, pelvic lymph node dissection and vaginal biopsies   09/25/2014 Procedure Port-A-Cath placement in IR   10/18/2014 -  Chemotherapy Carboplatin/Taxol. First cycle given at [facility].     MEDICAL HISTORY:  Past Medical History  Diagnosis Date  . Asthma   . Uterine fibroid   . Cancer     endometrial  . COPD (chronic obstructive pulmonary disease)     no definite diagnosis  . Anemia   . Vaginal Pap smear, abnormal   . Indigestion     SURGICAL HISTORY: Past Surgical History  Procedure Laterality Date  . Tubal ligation    . Appendectomy    . Abdominal hysterectomy    . Portacath placement Right 09/2014    SOCIAL  HISTORY: History   Social History  . Marital Status: Single    Spouse Name: N/A  . Number of Children: N/A  . Years of Education: N/A   Occupational History  . Not on file.   Social History Main Topics  . Smoking status: Current Every Day Smoker -- 0.00 packs/day for 25 years    Types: Cigarettes  . Smokeless tobacco: Not on file     Comment: smokes 1 pack per week  . Alcohol Use: No  . Drug Use: No  . Sexual Activity: Not Currently    Birth Control/ Protection: Surgical     Comment: hyst   Other Topics Concern  . Not on file   Social History Narrative  She was working, but now has applied for SSI and disability. She is now living with her niece. 2 children. 3 grandchildren, one is deceased. Divorced. Smoker 1/2 ppd. Trying to cut back.  Rare alcohol use.   FAMILY HISTORY: Family History  Problem Relation Age of Onset  . Other Mother     clot that went to heart  . Heart attack Father   . Stroke Father   . Cancer Sister     kidney  . Hypertension Sister   . Diabetes Brother   . Hypertension Brother   . Endometriosis Daughter   . Heart attack Maternal Grandfather   . Diabetes Sister    indicated that her mother is deceased. She indicated that her father is deceased. She indicated that both of her sisters are alive. She indicated that both of her brothers are alive. She indicated that her maternal grandmother is deceased. She indicated that  her maternal grandfather is deceased. She indicated that her paternal grandmother is deceased. She indicated that her paternal grandfather is deceased. She indicated that her daughter is alive. She indicated that her son is alive.    Mother deceased at 19 from embolus Father deceased at 53 from MI 2 sisters and 2 brothers, Oldest sister had nephrectomy for RCC.   ALLERGIES:  is allergic to codeine.  MEDICATIONS:  Current Outpatient Prescriptions  Medication Sig Dispense Refill  . albuterol (PROVENTIL HFA;VENTOLIN HFA) 108 (90  BASE) MCG/ACT inhaler Inhale 2 puffs into the lungs every 2 (two) hours as needed for wheezing or shortness of breath (cough). (Patient taking differently: Inhale 1 puff into the lungs every 6 (six) hours as needed for wheezing or shortness of breath (cough). ) 1 Inhaler 0  . ALPRAZolam (XANAX) 0.25 MG tablet Take 0.25 mg by mouth 3 (three) times daily as needed for anxiety. Pt unsure of dose    . citalopram (CELEXA) 20 MG tablet Take 20 mg by mouth daily.    Marland Kitchen loratadine-pseudoephedrine (CLARITIN-D 12-HOUR) 5-120 MG per tablet Take 1 tablet by mouth 2 (two) times daily.    Marland Kitchen omeprazole (PRILOSEC) 40 MG capsule Take 1 capsule (40 mg total) by mouth daily. 30 capsule 2  . ondansetron (ZOFRAN) 8 MG tablet Take 1 tablet (8 mg total) by mouth every 8 (eight) hours as needed for nausea or vomiting. 30 tablet 2  . traMADol (ULTRAM) 50 MG tablet Take 1 tablet (50 mg total) by mouth every 6 (six) hours as needed. 30 tablet 1  . traZODone (DESYREL) 50 MG tablet Take 50 mg by mouth at bedtime.    . ferrous sulfate 325 (65 FE) MG EC tablet Take 1 tablet (325 mg total) by mouth 2 (two) times daily. (Patient not taking: Reported on 02/08/2015)    . oxyCODONE-acetaminophen (PERCOCET/ROXICET) 5-325 MG per tablet Take 1 tablet by mouth every 4 (four) hours as needed for moderate pain or severe pain. (Patient not taking: Reported on 02/08/2015) 45 tablet 0   Current Facility-Administered Medications  Medication Dose Route Frequency Provider Last Rate Last Dose  . heparin lock flush 100 unit/mL  500 Units Intravenous Once Patrici Ranks, MD      . sodium chloride 0.9 % injection 10 mL  10 mL Intravenous PRN Patrici Ranks, MD        Review of Systems  Constitutional: Negative for fever, chills, weight loss and malaise/fatigue.  HENT: Negative for congestion, hearing loss, nosebleeds, sore throat and tinnitus.   Eyes: Negative for blurred vision, double vision, pain and discharge.  Respiratory: Negative for  cough, hemoptysis, sputum production, shortness of breath and wheezing.   Cardiovascular: Negative for chest pain, palpitations, claudication, leg swelling and PND.  Gastrointestinal: Negative for heartburn, abdominal pain, diarrhea, constipation, blood in stool and melena. Positive for nausea, vomiting Genitourinary: Negative for dysuria, urgency, frequency and hematuria.  Musculoskeletal: Negative for myalgias, joint pain and falls.  Skin: Negative for itching and rash.  Neurological: Negative for dizziness, tingling, tremors, sensory change, speech change, focal weakness, seizures, loss of consciousness, weakness and headaches.  Endo/Heme/Allergies: Does not bruise/bleed easily.  Psychiatric/Behavioral: Negative for depression, suicidal ideas, memory loss and substance abuse. The patient is not nervous/anxious and does not have insomnia.     PHYSICAL EXAMINATION:  ECOG PERFORMANCE STATUS: 0 - Asymptomatic  Filed Vitals:   02/08/15 1129  BP: 128/61  Pulse: 76  Temp: 98.2 F (36.8 C)  Resp: 18  Filed Weights   02/08/15 1129  Weight: 153 lb 11.2 oz (69.718 kg)     Physical Exam  Constitutional: She is oriented to person, place, and time and well-developed, well-nourished, and in no distress.  HENT:  Head: Normocephalic and atraumatic.  Nose: Nose normal.  Mouth/Throat: Oropharynx is clear and moist. No oropharyngeal exudate.  Eyes: Conjunctivae and EOM are normal. Pupils are equal, round, and reactive to light. Right eye exhibits no discharge. Left eye exhibits no discharge. No scleral icterus.  Neck: Normal range of motion. Neck supple. No tracheal deviation present. No thyromegaly present.  Cardiovascular: Normal rate, regular rhythm and normal heart sounds.  Exam reveals no gallop and no friction rub.   No murmur heard. Pulmonary/Chest: Effort normal and breath sounds normal. She has no wheezes. She has no rales.  Abdominal: Soft. Bowel sounds are normal. She exhibits no  distension and no mass. There is no tenderness. There is no rebound and no guarding.  Musculoskeletal: Normal range of motion. She exhibits no edema.  Lymphadenopathy:    She has no cervical adenopathy.  Neurological: She is alert and oriented to person, place, and time. She has normal reflexes. No cranial nerve deficit. Gait normal. Coordination normal.  Skin: Skin is warm and dry. No rash noted.  Psychiatric: Mood, memory, affect and judgment normal.  Nursing note and vitals reviewed.    LABORATORY DATA:  I have reviewed the data as listed Lab Results  Component Value Date   WBC 5.6 01/09/2015   HGB 10.8* 01/09/2015   HCT 31.9* 01/09/2015   MCV 99.7 01/09/2015   PLT 212 01/09/2015     Chemistry      Component Value Date/Time   NA 138 01/09/2015 0830   K 4.0 01/09/2015 0830   CL 105 01/09/2015 0830   CO2 26 01/09/2015 0830   BUN 13 01/09/2015 0830   CREATININE 0.70 01/09/2015 0830      Component Value Date/Time   CALCIUM 8.9 01/09/2015 0830   ALKPHOS 90 01/09/2015 0830   AST 28 01/09/2015 0830   ALT 44* 01/09/2015 0830   BILITOT 0.4 01/09/2015 0830     CLINICAL DATA: Restaging endometrial carcinoma status post 3 cycles of chemotherapy, last administered 3 weeks ago. Initial diagnosis 07/30/2014 with subsequent hysterectomy. Subsequent encounter.  EXAM: CT CHEST, ABDOMEN, AND PELVIS WITH CONTRAST IMPRESSION: 1. Status post total hysterectomy. No evidence of residual pelvic mass. 2. Stable mildly prominent retroperitoneal lymph nodes from 2008. No evidence of metastatic disease. 3. 5 mm subpleural right middle lobe nodule, likely a benign incidental finding. Clinic notes indicate outside imaging demonstrating pulmonary nodules suspicious for metastatic disease. Direct comparison with outside imaging and/or continued follow up suggested. 4. Mild sigmoid diverticulosis.   Electronically Signed  By: Richardean Sale M.D.  On: 11/20/2014  09:41    ASSESSMENT & PLAN:  Stage IV Endometrial Carcinoma diagnosed at [facility], decreasing use Anxiety New onset "vomiting" Dysuria/Back pain  1. Appointment with GI Dr. Gala Romney or Dr. Oneida Alar on 02/13/2015 to discuss possible  EGD and colonoscopy. Further evaluation of nausea/vomiting. 2. Perform urinalysis today and she will be notified of the results. Antibiotics will be prescribed if needed.  3. Due for CT Scan in June 2016 4. Discuss possible Tamoxifen medication administration. We will address this at her next follow-up post CT scans.   Orders Placed This Encounter  Procedures  . CT Abdomen Pelvis W Contrast    Amy, order in sys, Medicaid, working on pac, nbs  Standing Status: Future     Number of Occurrences:      Standing Expiration Date: 02/08/2016    Order Specific Question:  Reason for Exam (SYMPTOM  OR DIAGNOSIS REQUIRED)    Answer:  stage IV endometrial cancer, restaging    Order Specific Question:  Is the patient pregnant?    Answer:  No    Order Specific Question:  Preferred imaging location?    Answer:  [place], order in sys, Medicaid, working on pac, nbs     Standing Status: Future     Number of Occurrences:      Standing Expiration Date: 02/08/2016    Order Specific Question:  Reason for Exam (SYMPTOM  OR DIAGNOSIS REQUIRED)    Answer:  stage IV endometrial, restaging    Order Specific Question:  Is the patient pregnant?    Answer:  No    Order Specific Question:  Preferred imaging location?    Answer:  [facility]  . Urinalysis, Routine w reflex microscopic    Standing Status: Future     Number of Occurrences: 1     Standing Expiration Date: 02/08/2016  . Urine microscopic-add on    All questions were answered. The patient knows to call the clinic with any problems, questions or concerns.  This document serves as a record of services personally performed by Ancil Linsey, MD. It was  created on her behalf by Pearlie Oyster, a trained medical scribe. The creation of this record is based on the scribe's personal observations and the provider's statements to them. This document has been checked and approved by the attending provider.    I have reviewed the above documentation for accuracy and completeness, and I agree with the above.  Kelby Fam. Penland MD

## 2015-02-08 NOTE — Progress Notes (Signed)
Julia Osborne presented for Portacath access and flush. Dual Portacath located in the right chest wall accessed with  H 20 needle. Clean, Dry and Intact Good blood return present. Portacath flushed with 42ml NS and 500U/32ml Heparin per protocol via each port and needles removed intact. Procedure without incident. Patient tolerated procedure well.

## 2015-02-08 NOTE — Patient Instructions (Signed)
..  [place] at [facility] Discharge Instructions  RECOMMENDATIONS MADE BY THE CONSULTANT AND ANY TEST RESULTS WILL BE SENT TO YOUR REFERRING PHYSICIAN.  Exam today per Dr. Whitney Muse  We will check your urine CT scans in June Return to see [place] after scans  Thank you for choosing [place] at [facility] to provide your oncology and hematology care.  To afford each patient quality time with our provider, please arrive at least 15 minutes before your scheduled appointment time.    You need to re-schedule your appointment should you arrive 10 or more minutes late.  We strive to give you quality time with our providers, and arriving late affects you and other patients whose appointments are after yours.  Also, if you no show three or more times for appointments you may be dismissed from the clinic at the providers discretion.     Again, thank you for choosing [facility].  Our hope is that these requests will decrease the amount of time that you wait before being seen by our physicians.       _____________________________________________________________  Should you have questions after your visit to [facility], please contact our office at [phone] between the hours of 8:30 a.m. and 4:30 p.m.  Voicemails left after 4:30 p.m. will not be returned until the following business day.  For prescription refill requests, have your pharmacy contact our office.

## 2015-02-09 ENCOUNTER — Encounter (HOSPITAL_COMMUNITY): Payer: Self-pay | Admitting: Hematology & Oncology

## 2015-02-09 LAB — CA 125: CA 125: 14.6 U/mL (ref 0.0–34.0)

## 2015-02-09 NOTE — Progress Notes (Signed)
[facility] Progress Note  CHIEF COMPLAINTS:  Stage IV Endometrial Cancer, probable pulmonary metastases. Radical hysterectomy, BSO, pelvic LN dissection and vaginal biopsies 07/31/2014, patient had extensive pelvic disease including positive LN and positive distal vaginal biopsy  CA-125 137.41 on 07/19/2014 CA-125 25.72 on 09/27/2014  (normal range 0-20.9U/ml)  HISTORY OF PRESENTING ILLNESS:   Julia Osborne 54 y.o. female is here because of stage IV endometrial cancer.  She continues to improve. She denies any symptoms consistent with peripheral neuropathy. Her appetite is good She is excited and has gotten a new apartment. She is here today for her last of Carboplatin/Taxol. Her first cycle was given at [facility].  She has had some problems with allergies but notes this is normal this time of the year.     Endometrial cancer   07/12/2014 Imaging CT C/A/P, pelvic adenopathy, multiple pulmonary nodules concerning for metastatic disease   07/30/2014 Initial Diagnosis Endometrial cancer   07/31/2014 Pathology Results endometrioid adenocarcinoma, G2, lymphovascular invasion present, positive pelvic lymph node and vaginal biopsy   07/31/2014 Definitive Surgery radical hysterectomy, BSO, pelvic lymph node dissection and vaginal biopsies   09/25/2014 Procedure Port-A-Cath placement in IR   10/18/2014 -  Chemotherapy Carboplatin/Taxol. First cycle given at [facility].     MEDICAL HISTORY:  Past Medical History  Diagnosis Date  . Asthma   . Uterine fibroid   . Cancer     endometrial  . COPD (chronic obstructive pulmonary disease)     no definite diagnosis  . Anemia   . Vaginal Pap smear, abnormal   . Indigestion     SURGICAL HISTORY: Past Surgical History  Procedure Laterality Date  . Tubal ligation    . Appendectomy    . Abdominal hysterectomy    . Portacath placement Right 09/2014    SOCIAL HISTORY: History   Social History  . Marital Status: Single    Spouse Name:  N/A  . Number of Children: N/A  . Years of Education: N/A   Occupational History  . Not on file.   Social History Main Topics  . Smoking status: Current Every Day Smoker -- 0.00 packs/day for 25 years    Types: Cigarettes  . Smokeless tobacco: Not on file     Comment: smokes 1 pack per week  . Alcohol Use: No  . Drug Use: No  . Sexual Activity: Not Currently    Birth Control/ Protection: Surgical     Comment: hyst   Other Topics Concern  . Not on file   Social History Narrative  She was working, but now has applied for SSI and disability. She is now living with her niece. 2 children. 3 grandchildren, one is deceased. Divorced. Smoker 1/2 ppd. Trying to cut back.  Rare alcohol use.   FAMILY HISTORY: Family History  Problem Relation Age of Onset  . Other Mother     clot that went to heart  . Heart attack Father   . Stroke Father   . Cancer Sister     kidney  . Hypertension Sister   . Diabetes Brother   . Hypertension Brother   . Endometriosis Daughter   . Heart attack Maternal Grandfather   . Diabetes Sister    indicated that her mother is deceased. She indicated that her father is deceased. She indicated that both of her sisters are alive. She indicated that both of her brothers are alive. She indicated that her maternal grandmother is deceased. She indicated that her maternal grandfather  is deceased. She indicated that her paternal grandmother is deceased. She indicated that her paternal grandfather is deceased. She indicated that her daughter is alive. She indicated that her son is alive.    Mother deceased at 46 from embolus Father deceased at 70 from MI 2 sisters and 2 brothers, Oldest sister had nephrectomy for RCC.   ALLERGIES:  is allergic to codeine.  MEDICATIONS:  Current Outpatient Prescriptions  Medication Sig Dispense Refill  . albuterol (PROVENTIL HFA;VENTOLIN HFA) 108 (90 BASE) MCG/ACT inhaler Inhale 2 puffs into the lungs every 2 (two) hours as needed  for wheezing or shortness of breath (cough). (Patient taking differently: Inhale 1 puff into the lungs every 6 (six) hours as needed for wheezing or shortness of breath (cough). ) 1 Inhaler 0  . ALPRAZolam (XANAX) 0.25 MG tablet Take 0.25 mg by mouth 3 (three) times daily as needed for anxiety. Pt unsure of dose    . citalopram (CELEXA) 20 MG tablet Take 20 mg by mouth daily.    . ferrous sulfate 325 (65 FE) MG EC tablet Take 1 tablet (325 mg total) by mouth 2 (two) times daily. (Patient not taking: Reported on 02/08/2015)    . loratadine-pseudoephedrine (CLARITIN-D 12-HOUR) 5-120 MG per tablet Take 1 tablet by mouth 2 (two) times daily.    Marland Kitchen omeprazole (PRILOSEC) 40 MG capsule Take 1 capsule (40 mg total) by mouth daily. 30 capsule 2  . ondansetron (ZOFRAN) 8 MG tablet Take 1 tablet (8 mg total) by mouth every 8 (eight) hours as needed for nausea or vomiting. 30 tablet 2  . traMADol (ULTRAM) 50 MG tablet Take 1 tablet (50 mg total) by mouth every 6 (six) hours as needed. 30 tablet 1  . traZODone (DESYREL) 50 MG tablet Take 50 mg by mouth at bedtime.    Marland Kitchen oxyCODONE-acetaminophen (PERCOCET/ROXICET) 5-325 MG per tablet Take 1 tablet by mouth every 4 (four) hours as needed for moderate pain or severe pain. (Patient not taking: Reported on 02/08/2015) 45 tablet 0   No current facility-administered medications for this visit.   Facility-Administered Medications Ordered in Other Visits  Medication Dose Route Frequency Provider Last Rate Last Dose  . sodium chloride 0.9 % injection 10 mL  10 mL Intravenous PRN Patrici Ranks, MD   10 mL at 02/08/15 1206    Review of Systems  Constitutional: Negative for fever, chills, weight loss and malaise/fatigue.  HENT: Negative for congestion, hearing loss, nosebleeds, sore throat and tinnitus.   Eyes: Negative for blurred vision, double vision, pain and discharge.  Respiratory: Negative for cough, hemoptysis, sputum production, shortness of breath and wheezing.    Cardiovascular: Negative for chest pain, palpitations, claudication, leg swelling and PND.  Gastrointestinal: Negative for heartburn, nausea, vomiting, abdominal pain, diarrhea, constipation, blood in stool and melena.  Genitourinary: Negative for dysuria, urgency, frequency and hematuria.  Musculoskeletal: Negative for myalgias, joint pain and falls.  Skin: Negative for itching and rash.  Neurological: Negative for dizziness, tingling, tremors, sensory change, speech change, focal weakness, seizures, loss of consciousness, weakness and headaches.  Endo/Heme/Allergies: Does not bruise/bleed easily.  Psychiatric/Behavioral: Negative for depression, suicidal ideas, memory loss and substance abuse. The patient is not nervous/anxious and does not have insomnia.     PHYSICAL EXAMINATION:  ECOG PERFORMANCE STATUS: 0 - Asymptomatic  Filed Vitals:   01/09/15 0858  BP: 100/72  Pulse: 76  Temp: 98.5 F (36.9 C)  Resp: 18   Filed Weights   01/09/15 0848  Weight: 149  lb 12.8 oz (67.949 kg)     Physical Exam  Constitutional: She is oriented to person, place, and time and well-developed, well-nourished, and in no distress.  HENT:  Head: Normocephalic and atraumatic.  Nose: Nose normal.  Mouth/Throat: Oropharynx is clear and moist. No oropharyngeal exudate.  Eyes: Conjunctivae and EOM are normal. Pupils are equal, round, and reactive to light. Right eye exhibits no discharge. Left eye exhibits no discharge. No scleral icterus.  Neck: Normal range of motion. Neck supple. No tracheal deviation present. No thyromegaly present.  Cardiovascular: Normal rate, regular rhythm and normal heart sounds.  Exam reveals no gallop and no friction rub.   No murmur heard. Pulmonary/Chest: Effort normal and breath sounds normal. She has no wheezes. She has no rales.  Abdominal: Soft. Bowel sounds are normal. She exhibits no distension and no mass. There is no tenderness. There is no rebound and no guarding.   Well healed abdominal surgical incision site  Musculoskeletal: Normal range of motion. She exhibits no edema.  Lymphadenopathy:    She has no cervical adenopathy.  Neurological: She is alert and oriented to person, place, and time. She has normal reflexes. No cranial nerve deficit. Gait normal. Coordination normal.  Skin: Skin is warm and dry. No rash noted.  Psychiatric: Mood, memory, affect and judgment normal.  Nursing note and vitals reviewed.    LABORATORY DATA:  I have reviewed the data as listed Lab Results  Component Value Date   WBC 6.0 02/08/2015   HGB 11.8* 02/08/2015   HCT 35.0* 02/08/2015   MCV 99.4 02/08/2015   PLT 260 02/08/2015     Chemistry      Component Value Date/Time   NA 140 02/08/2015 1241   K 4.3 02/08/2015 1241   CL 105 02/08/2015 1241   CO2 27 02/08/2015 1241   BUN 13 02/08/2015 1241   CREATININE 0.71 02/08/2015 1241      Component Value Date/Time   CALCIUM 9.1 02/08/2015 1241   ALKPHOS 76 02/08/2015 1241   AST 24 02/08/2015 1241   ALT 39 02/08/2015 1241   BILITOT 0.6 02/08/2015 1241     CLINICAL DATA: Restaging endometrial carcinoma status post 3 cycles of chemotherapy, last administered 3 weeks ago. Initial diagnosis 07/30/2014 with subsequent hysterectomy. Subsequent encounter.  EXAM: CT CHEST, ABDOMEN, AND PELVIS WITH CONTRAST IMPRESSION: 1. Status post total hysterectomy. No evidence of residual pelvic mass. 2. Stable mildly prominent retroperitoneal lymph nodes from 2008. No evidence of metastatic disease. 3. 5 mm subpleural right middle lobe nodule, likely a benign incidental finding. Clinic notes indicate outside imaging demonstrating pulmonary nodules suspicious for metastatic disease. Direct comparison with outside imaging and/or continued follow up suggested. 4. Mild sigmoid diverticulosis.   Electronically Signed  By: Richardean Sale M.D.  On: 11/20/2014 09:41    ASSESSMENT & PLAN:  Stage IV Endometrial  Cancer, probable pulmonary metastases. Radical hysterectomy, BSO, pelvic LN dissection and vaginal biopsies 07/31/2014, patient had extensive pelvic disease including positive LN and positive distal vaginal biopsy Carboplatin/Taxol  She has done remarkably well with treatment. She has no signs and symptoms of neuropathy. She is making good progress on a personal level having recently gotten a new apartment. She will complete her chemotherapy today. Last imaging studies were excellent. I have briefly addressed with her ongoing observation including physical exam, laboratory studies and imaging. We will see her back in several weeks to see how she is doing and get her on a routine follow-up schedule. I have refilled her  requested medications today.  No problem-specific assessment & plan notes found for this encounter.  Orders Placed This Encounter  Procedures  . CBC with Differential    Standing Status: Future     Number of Occurrences: 1     Standing Expiration Date: 01/09/2016  . Comprehensive metabolic panel    Standing Status: Future     Number of Occurrences: 1     Standing Expiration Date: 01/09/2016  . CA 125    Standing Status: Future     Number of Occurrences: 1     Standing Expiration Date: 01/09/2016   All questions were answered. The patient knows to call the clinic with any problems, questions or concerns.   Molli Hazard, MD  02/09/2015 7:31 PM

## 2015-02-14 ENCOUNTER — Ambulatory Visit (INDEPENDENT_AMBULATORY_CARE_PROVIDER_SITE_OTHER): Payer: Medicaid Other | Admitting: Gastroenterology

## 2015-02-14 ENCOUNTER — Encounter: Payer: Self-pay | Admitting: Gastroenterology

## 2015-02-14 ENCOUNTER — Other Ambulatory Visit: Payer: Self-pay

## 2015-02-14 VITALS — BP 129/78 | HR 71 | Temp 97.9°F | Ht 63.0 in | Wt 153.4 lb

## 2015-02-14 DIAGNOSIS — K219 Gastro-esophageal reflux disease without esophagitis: Secondary | ICD-10-CM | POA: Diagnosis not present

## 2015-02-14 DIAGNOSIS — R1314 Dysphagia, pharyngoesophageal phase: Secondary | ICD-10-CM

## 2015-02-14 DIAGNOSIS — R1310 Dysphagia, unspecified: Secondary | ICD-10-CM | POA: Insufficient documentation

## 2015-02-14 DIAGNOSIS — R112 Nausea with vomiting, unspecified: Secondary | ICD-10-CM

## 2015-02-14 DIAGNOSIS — R1319 Other dysphagia: Secondary | ICD-10-CM

## 2015-02-14 DIAGNOSIS — K625 Hemorrhage of anus and rectum: Secondary | ICD-10-CM

## 2015-02-14 DIAGNOSIS — R1013 Epigastric pain: Secondary | ICD-10-CM | POA: Diagnosis not present

## 2015-02-14 MED ORDER — PEG-KCL-NACL-NASULF-NA ASC-C 100 G PO SOLR: 1.0000 | ORAL | Status: DC

## 2015-02-14 MED ORDER — PANTOPRAZOLE SODIUM 40 MG PO TBEC
40.0000 mg | DELAYED_RELEASE_TABLET | Freq: Every day | ORAL | Status: DC
Start: 2015-02-14 — End: 2015-06-05

## 2015-02-14 NOTE — Progress Notes (Signed)
Primary Care Physician:  Heber  Primary Gastroenterologist:  Garfield Cornea, MD   Chief Complaint  Patient presents with  . set up TCS    HPI:  Julia Osborne is a 54 y.o. female here for consideration of colonoscopy and to evaluate UGi symptoms. She was diagnosed stage IV endometrial cancer, probable pulmonary metastases back in October 2015. Underwent radical hysterectomy, bilateral salpingo-oophorectomy, pelvic lymph node dissection and vaginal biopsies in November 2015. She had extensive pelvic disease including positive lymph nodes and positive distal vaginal biopsy. She has completed chemotherapy. Her last scans as outlined below. She had mild sigmoid colon wall thickening and diverticulosis without surrounding inflammatory change. Describes having a colonoscopy 10-15 years ago at [facility], cannot remember the results.  She reports that she has always had stomach problems. Seem to gotten worse since her surgery and chemotherapy. Developed constant heartburn, wakes up in the middle of night with pressure in the mid chest, somewhat improved with belching. Lots of nausea with intermittent vomiting of foamy bitter stuff. Complains of solid food and large pill dysphagia. Symptoms associated with epigastric pain. Epigastric pain is worse with meals. Describes some early satiety. Believes her weight has stabilized. Lost a lot of weight prior to her diagnosis and gain most of this back with steroids. For heartburn and epigastric pain, she's tried omeprazole 40 mg daily. Really hasn't noticed any improvement. Takes Zofran 1-2 times daily for nausea with good results. Tendency towards constipation however states she's been very regular the last 6 months. Daily bowel movement. Previously would see bright red blood per rectum and passed a hard stool. Denies any melena.  Patient states that she takes tramadol, Celexa, trazodone every night. She takes Xanax rarely. She is  anxious about the procedure wants to make sure she is adequately sedated.     Current Outpatient Prescriptions  Medication Sig Dispense Refill  . albuterol (PROVENTIL HFA;VENTOLIN HFA) 108 (90 BASE) MCG/ACT inhaler Inhale 2 puffs into the lungs every 2 (two) hours as needed for wheezing or shortness of breath (cough). (Patient taking differently: Inhale 1 puff into the lungs every 6 (six) hours as needed for wheezing or shortness of breath (cough). ) 1 Inhaler 0  . ALPRAZolam (XANAX) 0.25 MG tablet Take 0.25 mg by mouth 3 (three) times daily as needed for anxiety. Pt unsure of dose    . citalopram (CELEXA) 20 MG tablet Take 20 mg by mouth daily.    Marland Kitchen loratadine-pseudoephedrine (CLARITIN-D 12-HOUR) 5-120 MG per tablet Take 1 tablet by mouth 2 (two) times daily.    Marland Kitchen omeprazole (PRILOSEC) 40 MG capsule Take 1 capsule (40 mg total) by mouth daily. 30 capsule 2  . ondansetron (ZOFRAN) 8 MG tablet Take 1 tablet (8 mg total) by mouth every 8 (eight) hours as needed for nausea or vomiting. 30 tablet 2  . traMADol (ULTRAM) 50 MG tablet Take 1 tablet (50 mg total) by mouth every 6 (six) hours as needed. 30 tablet 1  . traZODone (DESYREL) 50 MG tablet Take 50 mg by mouth at bedtime.     No current facility-administered medications for this visit.    Allergies as of 02/14/2015 - Review Complete 02/14/2015  Allergen Reaction Noted  . Codeine Rash 08/25/2011    Past Medical History  Diagnosis Date  . Asthma   . Uterine fibroid   . Cancer     endometrial  . COPD (chronic obstructive pulmonary disease)     no definite diagnosis  .  Anemia   . Vaginal Pap smear, abnormal   . Indigestion     Past Surgical History  Procedure Laterality Date  . Tubal ligation    . Appendectomy    . Abdominal hysterectomy    . Portacath placement Right 09/2014    Family History  Problem Relation Age of Onset  . Other Mother     clot that went to heart, deceased age 51ss  . Heart attack Father     age 35s,  deceased  . Stroke Father   . Cancer Sister     kidney  . Hypertension Sister   . Diabetes Brother   . Hypertension Brother   . Endometriosis Daughter   . Heart attack Maternal Grandfather   . Diabetes Sister   . Colon cancer Neg Hx     History   Social History  . Marital Status: Single    Spouse Name: N/A  . Number of Children: 2  . Years of Education: N/A   Occupational History  . Not on file.   Social History Main Topics  . Smoking status: Current Every Day Smoker -- 0.00 packs/day for 25 years    Types: Cigarettes  . Smokeless tobacco: Not on file     Comment: smokes 1 pack per week  . Alcohol Use: No  . Drug Use: No  . Sexual Activity: Not Currently    Birth Control/ Protection: Surgical     Comment: hyst   Other Topics Concern  . Not on file   Social History Narrative      ROS:  General: Negative for fever, chills. See hpi. Eyes: Negative for vision changes.  ENT: Negative for  nasal congestion. C/o raspy voice. See hpi. CV: Negative for chest pain, angina, palpitations, dyspnea on exertion, peripheral edema.  Respiratory: Negative for dyspnea at rest, dyspnea on exertion, cough, sputum, wheezing.  GI: See history of present illness. GU:  Negative for dysuria, hematuria, urinary incontinence, urinary frequency, nocturnal urination.  MS: Negative for joint pain, low back pain.  Derm: Negative for rash or itching.  Neuro: Negative for weakness, abnormal sensation, seizure, frequent headaches, memory loss, confusion.  Psych: Negative for anxiety, depression, suicidal ideation, hallucinations.  Endo: Negative for unusual weight change. See hpi. Heme: Negative for bruising or bleeding. Allergy: Negative for rash or hives.    Physical Examination:  BP 129/78 mmHg  Pulse 71  Temp(Src) 97.9 F (36.6 C)  Ht 5\' 3"  (1.6 m)  Wt 153 lb 6.4 oz (69.582 kg)  BMI 27.18 kg/m2   General: Well-nourished, well-developed in no acute distress.  Head:  Normocephalic, atraumatic.   Eyes: Conjunctiva pink, no icterus. Mouth: Oropharyngeal mucosa moist and pink , no lesions erythema or exudate. Neck: Supple without thyromegaly, masses, or lymphadenopathy.  Lungs: Clear to auscultation bilaterally.  Heart: Regular rate and rhythm, no murmurs rubs or gallops.  Abdomen: Bowel sounds are normal, nontender, nondistended, no hepatosplenomegaly or masses, no abdominal bruits or    hernia , no rebound or guarding.  Well healed mid line incision Rectal: deferred Extremities: No lower extremity edema. No clubbing or deformities.  Neuro: Alert and oriented x 4 , grossly normal neurologically.  Skin: Warm and dry, no rash or jaundice.   Psych: Alert and cooperative, normal mood and affect.  Labs: Lab Results  Component Value Date   WBC 6.0 02/08/2015   HGB 11.8* 02/08/2015   HCT 35.0* 02/08/2015   MCV 99.4 02/08/2015   PLT 260 02/08/2015   Lab  Results  Component Value Date   CREATININE 0.71 02/08/2015   BUN 13 02/08/2015   NA 140 02/08/2015   K 4.3 02/08/2015   CL 105 02/08/2015   CO2 27 02/08/2015   Lab Results  Component Value Date   ALT 39 02/08/2015   AST 24 02/08/2015   ALKPHOS 76 02/08/2015   BILITOT 0.6 02/08/2015   Lab Results  Component Value Date   CREATININE 0.71 02/08/2015   BUN 13 02/08/2015   NA 140 02/08/2015   K 4.3 02/08/2015   CL 105 02/08/2015   CO2 27 02/08/2015     Imaging Studies:   CLINICAL DATA: Restaging endometrial carcinoma status post 3 cycles of chemotherapy, last administered 3 weeks ago. Initial diagnosis 07/30/2014 with subsequent hysterectomy. Subsequent encounter.  EXAM: CT CHEST, ABDOMEN, AND PELVIS WITH CONTRAST  TECHNIQUE: Multidetector CT imaging of the chest, abdomen and pelvis was performed following the standard protocol during bolus administration of intravenous contrast.  CONTRAST: 120mL OMNIPAQUE IOHEXOL 300 MG/ML SOLN  COMPARISON: Abdominal pelvic CT 08/31/2007.  Pelvic ultrasound 02/15/2014.  FINDINGS: CT CHEST FINDINGS  Mediastinum/Nodes: There are no enlarged mediastinal, hilar or axillary lymph nodes. The thyroid gland, trachea and esophagus demonstrate no significant findings. The heart size is normal. There is no pericardial effusion.Right IJ Port-A-Cath tip is in the upper right atrium. There are no significant vascular findings.  Lungs/Pleura: There is no pleural effusion.5 mm subpleural nodule anteriorly in the right middle lobe on image number 3 T8 was not clearly imaged on the prior abdominal CT and is probably a lymph node. No suspicious pulmonary nodules or confluent airspace opacities are demonstrated.  Musculoskeletal/Chest wall: No evidence of chest wall mass or suspicious osseous finding.  CT ABDOMEN AND PELVIS FINDINGS  Hepatobiliary: The liver is normal in density without focal abnormality. No evidence of gallstones, gallbladder wall thickening or biliary dilatation.  Pancreas: Unremarkable. No pancreatic ductal dilatation or surrounding inflammatory changes.  Spleen: Normal in size without focal abnormality.  Adrenals/Urinary Tract: Both adrenal glands appear normal.The kidneys appear normal without evidence of urinary tract calculus, suspicious lesion or hydronephrosis. No bladder abnormalities are seen.  Stomach/Bowel: There is mild sigmoid colon wall thickening and diverticulosis without surrounding inflammatory change. There is mildly prominent stool throughout the colon. No small bowel wall thickening, bowel distension or extraluminal fluid collection demonstrated.  Vascular/Lymphatic: 7 mm left periaortic node on image 72 and 8 mm left common iliac node on image number 80 are unchanged from 2008, consistent with a benign etiology. No new or enlarging abdominal pelvic lymph nodes identified. Mild aortoiliac atherosclerosis noted.  Reproductive: Status post interval total hysterectomy. No  evidence of adnexal mass, pelvic inflammatory process or fluid collection.  Other: Postsurgical changes within the infraumbilical anterior abdominal wall. No evidence of abdominal wall mass or hernia.  Musculoskeletal: No acute or significant osseous findings. Degenerative disc disease noted at L5-S1.  IMPRESSION: 1. Status post total hysterectomy. No evidence of residual pelvic mass. 2. Stable mildly prominent retroperitoneal lymph nodes from 2008. No evidence of metastatic disease. 3. 5 mm subpleural right middle lobe nodule, likely a benign incidental finding. Clinic notes indicate outside imaging demonstrating pulmonary nodules suspicious for metastatic disease. Direct comparison with outside imaging and/or continued follow up suggested. 4. Mild sigmoid diverticulosis.   Electronically Signed  By: Richardean Sale M.D.  On: 11/20/2014 09:41

## 2015-02-14 NOTE — Patient Instructions (Signed)
1. Colonoscopy and upper endoscopy as scheduled. See separate instructions.  2. Stop omeprazole. Try pantoprazole once daily before first meal of the day. Let us know if not covered by Medicaid.

## 2015-02-20 ENCOUNTER — Ambulatory Visit (HOSPITAL_COMMUNITY)
Admission: RE | Admit: 2015-02-20 | Discharge: 2015-02-20 | Disposition: A | Discharge status: Home / Self Care | Payer: Medicaid Other | Source: Ambulatory Visit | Attending: Adult Health | Admitting: Adult Health

## 2015-02-20 DIAGNOSIS — Z1231 Encounter for screening mammogram for malignant neoplasm of breast: Secondary | ICD-10-CM | POA: Insufficient documentation

## 2015-02-21 NOTE — Assessment & Plan Note (Signed)
Intermittent rectal bleeding. Mild sigmoid colon wall thickening on CT. Remote TCS. Recommend colonoscopy with deep sedation given polypharmacy.  I have discussed the risks, alternatives, benefits with regards to but not limited to the risk of reaction to medication, bleeding, infection, perforation and the patient is agreeable to proceed. Written consent to be obtained.

## 2015-02-21 NOTE — Assessment & Plan Note (Signed)
Refractory heartburn, nausea/vomiting, solid food and pill dysphagia, epigastric pain. +early satiety. Symptoms worse since surgery and chemotherapy for endometrial cancer. No improvement on PPI therapy. Recommend EGD to evaluate with deep sedation given polypharmacy. DDx: complicated GERD, gastritis, PUD.  I have discussed the risks, alternatives, benefits with regards to but not limited to the risk of reaction to medication, bleeding, infection, perforation and the patient is agreeable to proceed. Written consent to be obtained. Change PPI to pantoprazole.

## 2015-02-26 ENCOUNTER — Encounter (HOSPITAL_COMMUNITY): Payer: Self-pay

## 2015-02-26 ENCOUNTER — Ambulatory Visit (HOSPITAL_COMMUNITY)
Admission: RE | Admit: 2015-02-26 | Discharge: 2015-02-26 | Disposition: A | Discharge status: Home / Self Care | Payer: Medicaid Other | Source: Ambulatory Visit | Attending: Hematology & Oncology | Admitting: Hematology & Oncology

## 2015-02-26 DIAGNOSIS — C541 Malignant neoplasm of endometrium: Secondary | ICD-10-CM | POA: Diagnosis present

## 2015-02-26 DIAGNOSIS — Z9221 Personal history of antineoplastic chemotherapy: Secondary | ICD-10-CM | POA: Insufficient documentation

## 2015-02-26 DIAGNOSIS — R110 Nausea: Secondary | ICD-10-CM | POA: Insufficient documentation

## 2015-02-26 DIAGNOSIS — Z90710 Acquired absence of both cervix and uterus: Secondary | ICD-10-CM | POA: Insufficient documentation

## 2015-02-26 DIAGNOSIS — Z95828 Presence of other vascular implants and grafts: Secondary | ICD-10-CM

## 2015-02-26 MED ORDER — IOHEXOL 300 MG/ML  SOLN
100.0000 mL | Freq: Once | INTRAMUSCULAR | Status: AC | PRN
  Administered 2015-02-26: 100 mL via INTRAVENOUS

## 2015-02-27 ENCOUNTER — Encounter (HOSPITAL_COMMUNITY): Payer: Self-pay | Admitting: Hematology & Oncology

## 2015-02-27 ENCOUNTER — Encounter (HOSPITAL_COMMUNITY): Payer: Medicaid Other | Attending: Hematology & Oncology | Admitting: Hematology & Oncology

## 2015-02-27 DIAGNOSIS — D259 Leiomyoma of uterus, unspecified: Secondary | ICD-10-CM | POA: Insufficient documentation

## 2015-02-27 DIAGNOSIS — J45909 Unspecified asthma, uncomplicated: Secondary | ICD-10-CM | POA: Insufficient documentation

## 2015-02-27 DIAGNOSIS — R110 Nausea: Secondary | ICD-10-CM

## 2015-02-27 DIAGNOSIS — C541 Malignant neoplasm of endometrium: Secondary | ICD-10-CM | POA: Insufficient documentation

## 2015-02-27 DIAGNOSIS — Z720 Tobacco use: Secondary | ICD-10-CM | POA: Diagnosis not present

## 2015-02-27 DIAGNOSIS — J449 Chronic obstructive pulmonary disease, unspecified: Secondary | ICD-10-CM | POA: Insufficient documentation

## 2015-02-27 MED ORDER — HEPARIN SOD (PORK) LOCK FLUSH 100 UNIT/ML IV SOLN
INTRAVENOUS | Status: AC
  Filled 2015-02-27: qty 5

## 2015-02-27 MED ORDER — HEPARIN SOD (PORK) LOCK FLUSH 100 UNIT/ML IV SOLN
500.0000 [IU] | Freq: Once | INTRAVENOUS | Status: AC
  Administered 2015-02-27: 500 [IU] via INTRAVENOUS

## 2015-02-27 MED ORDER — SODIUM CHLORIDE 0.9 % IJ SOLN
10.0000 mL | INTRAMUSCULAR | Status: DC | PRN
  Administered 2015-02-27: 10 mL via INTRAVENOUS
  Filled 2015-02-27: qty 10

## 2015-02-27 NOTE — Progress Notes (Signed)
[place] PROGRESS NOTE  CHIEF COMPLAINTS/PURPOSE OF CONSULTATION:  Stage IV Endometrial Cancer, probable pulmonary metastases. Radical hysterectomy, BSO, pelvic LN dissection and vaginal biopsies 07/31/2014, patient had extensive pelvic disease including positive LN and positive distal vaginal biopsy  CA-125 137.41 on 07/19/2014 CA-125 25.72 on 09/27/2014  (normal range 0-20.9U/ml)  HISTORY OF PRESENTING ILLNESS:   Julia Osborne 54 y.o. female is here because of stage IV endometrial cancer. She has completed all therapy. She is back up to smoking one pack of cigarettes daily. She states she is bored.  She questioned vocational rehabilitation due to the fact that she does not like to sit home and would like to get back to work and/or school.  She does not just want to 'sit around'; she would like to start a part time in a store such as [employer].  Currently she walks and goes to [employer] often.  She has experienced hair growth on her head and eyebrows.  She has problems with intermittent nausea.  She has been scheduled for a Colonoscopy and EGD on 6/23  She is here today for review of her CT scans.    Endometrial cancer   07/12/2014 Imaging CT C/A/P, pelvic adenopathy, multiple pulmonary nodules concerning for metastatic disease   07/30/2014 Initial Diagnosis Endometrial cancer   07/31/2014 Pathology Results endometrioid adenocarcinoma, G2, lymphovascular invasion present, positive pelvic lymph node and vaginal biopsy   07/31/2014 Definitive Surgery radical hysterectomy, BSO, pelvic lymph node dissection and vaginal biopsies   09/25/2014 Procedure Port-A-Cath placement in IR   10/18/2014 -  Chemotherapy Carboplatin/Taxol. First cycle given at [facility].     MEDICAL HISTORY:  Past Medical History  Diagnosis Date  . Asthma   . Uterine fibroid   . Cancer     endometrial  . COPD (chronic obstructive pulmonary disease)     no definite diagnosis  . Anemia   . Vaginal Pap  smear, abnormal   . Indigestion     SURGICAL HISTORY: Past Surgical History  Procedure Laterality Date  . Tubal ligation    . Appendectomy    . Abdominal hysterectomy    . Portacath placement Right 09/2014    SOCIAL HISTORY: History   Social History  . Marital Status: Single    Spouse Name: N/A  . Number of Children: 2  . Years of Education: N/A   Occupational History  . Not on file.   Social History Main Topics  . Smoking status: Current Every Day Smoker -- 0.00 packs/day for 25 years    Types: Cigarettes  . Smokeless tobacco: Not on file     Comment: smokes 1 pack per week  . Alcohol Use: No  . Drug Use: No  . Sexual Activity: Not Currently    Birth Control/ Protection: Surgical     Comment: hyst   Other Topics Concern  . Not on file   Social History Narrative  She was working, but now has applied for SSI and disability. She is now living with her niece. 2 children. 3 grandchildren, one is deceased. Divorced. Smoker 1/2 ppd. Trying to cut back.  Rare alcohol use.   FAMILY HISTORY: Family History  Problem Relation Age of Onset  . Other Mother     clot that went to heart, deceased age 3ss  . Heart attack Father     age 12s, deceased  . Stroke Father   . Cancer Sister     kidney  . Hypertension Sister   .  Diabetes Brother   . Hypertension Brother   . Endometriosis Daughter   . Heart attack Maternal Grandfather   . Diabetes Sister   . Colon cancer Neg Hx    indicated that her mother is deceased. She indicated that her father is deceased. She indicated that both of her sisters are alive. She indicated that both of her brothers are alive. She indicated that her maternal grandmother is deceased. She indicated that her maternal grandfather is deceased. She indicated that her paternal grandmother is deceased. She indicated that her paternal grandfather is deceased. She indicated that her daughter is alive. She indicated that her son is alive.    Mother deceased  at 47 from embolus Father deceased at 43 from MI 2 sisters and 2 brothers, Oldest sister had nephrectomy for RCC.   ALLERGIES:  is allergic to codeine.  MEDICATIONS:  Current Outpatient Prescriptions  Medication Sig Dispense Refill  . albuterol (PROVENTIL HFA;VENTOLIN HFA) 108 (90 BASE) MCG/ACT inhaler Inhale 2 puffs into the lungs every 2 (two) hours as needed for wheezing or shortness of breath (cough). (Patient taking differently: Inhale 1 puff into the lungs every 6 (six) hours as needed for wheezing or shortness of breath (cough). ) 1 Inhaler 0  . ALPRAZolam (XANAX) 0.25 MG tablet Take 0.25 mg by mouth 3 (three) times daily as needed for anxiety. Pt unsure of dose    . citalopram (CELEXA) 20 MG tablet Take 20 mg by mouth daily.    Marland Kitchen loratadine-pseudoephedrine (CLARITIN-D 12-HOUR) 5-120 MG per tablet Take 1 tablet by mouth 2 (two) times daily.    . ondansetron (ZOFRAN) 8 MG tablet Take 1 tablet (8 mg total) by mouth every 8 (eight) hours as needed for nausea or vomiting. 30 tablet 2  . pantoprazole (PROTONIX) 40 MG tablet Take 1 tablet (40 mg total) by mouth daily. 30 tablet 3  . traMADol (ULTRAM) 50 MG tablet Take 1 tablet (50 mg total) by mouth every 6 (six) hours as needed. 30 tablet 1  . traZODone (DESYREL) 50 MG tablet Take 50 mg by mouth at bedtime.    . peg 3350 powder (MOVIPREP) 100 G SOLR Take 1 kit (200 g total) by mouth as directed. 1 kit 0   Current Facility-Administered Medications  Medication Dose Route Frequency Provider Last Rate Last Dose  . sodium chloride 0.9 % injection 10 mL  10 mL Intravenous PRN Patrici Ranks, MD   10 mL at 02/27/15 1115    Review of Systems  Constitutional: Negative for fever, chills, weight loss and malaise/fatigue.  HENT: Negative for congestion, hearing loss, nosebleeds, sore throat and tinnitus.   Eyes: Negative for blurred vision, double vision, pain and discharge.  Respiratory: Negative for cough, hemoptysis, sputum production,  shortness of breath and wheezing.   Cardiovascular: Negative for chest pain, palpitations, claudication, leg swelling and PND.  Gastrointestinal: Negative for heartburn, abdominal pain, diarrhea, constipation, blood in stool and melena. Positive for nausea Genitourinary: Negative for dysuria, urgency, frequency and hematuria.  Musculoskeletal: Negative for myalgias, joint pain and falls.  Skin: Negative for itching and rash.  Neurological: Negative for dizziness, tingling, tremors, sensory change, speech change, focal weakness, seizures, loss of consciousness, weakness and headaches.  Endo/Heme/Allergies: Does not bruise/bleed easily.  Psychiatric/Behavioral: Negative for depression, suicidal ideas, memory loss and substance abuse. The patient is not nervous/anxious and does not have insomnia.     PHYSICAL EXAMINATION:  ECOG PERFORMANCE STATUS: 0 - Asymptomatic  Filed Vitals:   02/27/15 1018  BP: 114/70  Pulse: 69  Temp: 98.3 F (36.8 C)  Resp: 18   Filed Weights   02/27/15 1018  Weight: 154 lb (69.854 kg)     Physical Exam  Constitutional: She is oriented to person, place, and time and Osborne-developed, Osborne-nourished, and in no distress. hair regrowth noted HENT:  Head: Normocephalic and atraumatic.  Nose: Nose normal.  Mouth/Throat: Oropharynx is clear and moist. No oropharyngeal exudate.  Eyes: Conjunctivae and EOM are normal. Pupils are equal, round, and reactive to light. Right eye exhibits no discharge. Left eye exhibits no discharge. No scleral icterus.  Neck: Normal range of motion. Neck supple. No tracheal deviation present. No thyromegaly present.  Cardiovascular: Normal rate, regular rhythm and normal heart sounds.  Exam reveals no gallop and no friction rub.   No murmur heard. Pulmonary/Chest: Effort normal and breath sounds normal. She has no wheezes. She has no rales.  Abdominal: Soft. Bowel sounds are normal. She exhibits no distension and no mass. There is no  tenderness. There is no rebound and no guarding.  Musculoskeletal: Normal range of motion. She exhibits no edema.  Lymphadenopathy:    She has no cervical adenopathy.  Neurological: She is alert and oriented to person, place, and time. She has normal reflexes. No cranial nerve deficit. Gait normal. Coordination normal.  Skin: Skin is warm and dry. No rash noted.  Psychiatric: Mood, memory, affect and judgment normal.  Nursing note and vitals reviewed.    LABORATORY DATA:  I have reviewed the data as listed Lab Results  Component Value Date   WBC 6.0 02/08/2015   HGB 11.8* 02/08/2015   HCT 35.0* 02/08/2015   MCV 99.4 02/08/2015   PLT 260 02/08/2015     Chemistry      Component Value Date/Time   NA 140 02/08/2015 1241   K 4.3 02/08/2015 1241   CL 105 02/08/2015 1241   CO2 27 02/08/2015 1241   BUN 13 02/08/2015 1241   CREATININE 0.71 02/08/2015 1241      Component Value Date/Time   CALCIUM 9.1 02/08/2015 1241   ALKPHOS 76 02/08/2015 1241   AST 24 02/08/2015 1241   ALT 39 02/08/2015 1241   BILITOT 0.6 02/08/2015 1241     RADIOLOGY: I have reviewed the images listed below and agree with the results  02/26/2015 CLINICAL DATA: Stage IV endometrial cancer diagnosed in October 2015. Complete hysterectomy with chemotherapy completed 2 months ago. Nausea. Restaging. Subsequent encounter.  EXAM: CT CHEST, ABDOMEN, AND PELVIS WITH CONTRAST  TECHNIQUE: Multidetector CT imaging of the chest, abdomen and pelvis was performed following the standard protocol during bolus administration of intravenous contrast.  CONTRAST: 156m OMNIPAQUE IOHEXOL 300 MG/ML SOLN  COMPARISON: CT 11/20/2014 and 08/31/2007.  FINDINGS: CT CHEST FINDINGS  Mediastinum/Nodes: There are no enlarged mediastinal, hilar or axillary lymph nodes. The thyroid gland, trachea and esophagus demonstrate no significant findings. The heart size is normal. There is no pericardial effusion.Right IJ  Port-A-Cath tip appears unchanged in the upper right atrium.  Lungs/Pleura: There is no pleural effusion.The 5 mm subpleural right middle lobe nodule on image number 38 is unchanged. No new or enlarging pulmonary nodules demonstrated.  Musculoskeletal/Chest wall: No chest wall mass or suspicious osseous findings.  CT ABDOMEN AND PELVIS FINDINGS  Hepatobiliary: The liver is normal in density without focal abnormality. No evidence of gallstones, gallbladder wall thickening or biliary dilatation.  Pancreas: Unremarkable. No pancreatic ductal dilatation or surrounding inflammatory changes.  Spleen: Normal in size without focal abnormality.  Adrenals/Urinary Tract: Both adrenal glands appear normal.The kidneys appear normal without evidence of urinary tract calculus, suspicious lesion or hydronephrosis. No bladder abnormalities are seen.  Stomach/Bowel: No evidence of bowel wall thickening, distention or surrounding inflammatory change.Prominent stool is again noted throughout the colon.  Vascular/Lymphatic: Small periaortic and left common iliac lymph nodes are unchanged. There is no pelvic lymphadenopathy. Mild aortoiliac atherosclerosis appears unchanged.  Reproductive: Status post hysterectomy. No evidence of pelvic mass.  Other: Stable postsurgical changes within the infraumbilical abdominal wall. No abdominal wall mass or hernia.  Musculoskeletal: No acute or significant osseous findings. Stable degenerative disc disease at L5-S1.  IMPRESSION: 1. Stable CTs of the chest, abdomen and pelvis. No evidence of metastatic disease. 2. Stable 5 mm subpleural right middle lobe nodule, likely a lymph node. 3. No new or acute findings. 4. Prominent stool throughout the colon suggesting constipation.   Electronically Signed  By: Richardean Sale M.D.  On: 02/26/2015 12:51    EXAM: DIGITAL SCREENING BILATERAL MAMMOGRAM WITH 3D TOMO WITH  CAD  COMPARISON: None.  ACR Breast Density Category b: There are scattered areas of fibroglandular density.  FINDINGS: There are no findings suspicious for malignancy. Images were processed with CAD.  IMPRESSION: No mammographic evidence of malignancy. A result letter of this screening mammogram will be mailed directly to the patient.  RECOMMENDATION: Screening mammogram in one year. (Code:SM-B-01Y)  BI-RADS CATEGORY 1: Negative.   Electronically Signed  By: Nolon Nations M.D.  On: 02/25/2015 17:08    ASSESSMENT & PLAN:  Stage IV Endometrial Carcinoma diagnosed at [facility] Abuse Nausea  She is doing very Osborne. Reviewed her CT imaging which confirms remission. She is becoming more active. She would like to try some form of part-time work and I certainly encouraged this.  We will see her back in 3 months with repeat laboratory studies including a CEA-125.  She is to keep her appointment with GI in regards to her EGD and colonoscopy.  6 month follow up with scans.  All questions were answered. The patient knows to call the clinic with any problems, questions or concerns.  This document serves as a record of services personally performed by Ancil Linsey, MD. It was created on her behalf by Arlyce Harman, a trained medical scribe. The creation of this record is based on the scribe's personal observations and the provider's statements to them. This document has been checked and approved by the attending provider.  I have reviewed the above documentation for accuracy and completeness, and I agree with the above. This note was electronically signed Kelby Fam. Izadora Roehr MD

## 2015-02-27 NOTE — Progress Notes (Signed)
Julia Osborne presented for Portacath access and flush.  Portacath located right chest wall accessed with H 20 needle.  Good blood return present. Portacath flushed with 32ml NS and 500U/64ml Heparin and needle removed intact.  Procedure tolerated well and without incident.

## 2015-02-27 NOTE — Patient Instructions (Signed)
[place] at [facility] Discharge Instructions  RECOMMENDATIONS MADE BY THE CONSULTANT AND ANY TEST RESULTS WILL BE SENT TO YOUR REFERRING PHYSICIAN.  Office visit with Dr. Whitney Muse today. Return as scheduled for port flushes. Office visit as scheduled in 4 months.   Thank you for choosing [place] at [facility] to provide your oncology and hematology care.  To afford each patient quality time with our provider, please arrive at least 15 minutes before your scheduled appointment time.    You need to re-schedule your appointment should you arrive 10 or more minutes late.  We strive to give you quality time with our providers, and arriving late affects you and other patients whose appointments are after yours.  Also, if you no show three or more times for appointments you may be dismissed from the clinic at the providers discretion.     Again, thank you for choosing [facility].  Our hope is that these requests will decrease the amount of time that you wait before being seen by our physicians.       _____________________________________________________________  Should you have questions after your visit to [facility], please contact our office at [phone] between the hours of 8:30 a.m. and 4:30 p.m.  Voicemails left after 4:30 p.m. will not be returned until the following business day.  For prescription refill requests, have your pharmacy contact our office.

## 2015-02-27 NOTE — Progress Notes (Signed)
CC'ED TO PCP 

## 2015-03-08 ENCOUNTER — Encounter (HOSPITAL_COMMUNITY): Admission: RE | Admit: 2015-03-08 | Payer: Medicaid Other | Source: Ambulatory Visit

## 2015-03-08 ENCOUNTER — Encounter (HOSPITAL_COMMUNITY)
Admission: RE | Admit: 2015-03-08 | Discharge: 2015-03-08 | Disposition: A | Discharge status: Home / Self Care | Payer: Medicaid Other | Source: Ambulatory Visit | Attending: Internal Medicine | Admitting: Internal Medicine

## 2015-03-08 ENCOUNTER — Encounter (HOSPITAL_COMMUNITY): Payer: Self-pay

## 2015-03-12 ENCOUNTER — Other Ambulatory Visit: Payer: Self-pay

## 2015-03-12 ENCOUNTER — Telehealth: Payer: Self-pay | Admitting: General Practice

## 2015-03-12 MED ORDER — PEG 3350-KCL-NA BICARB-NACL 420 G PO SOLR: 4000.0000 mL | ORAL | Status: DC

## 2015-03-12 NOTE — Telephone Encounter (Signed)
Patient called in and stated her insurance would not pay for Moviprep.  Per Ginger we will call in Trilyte and the patient was asked to come by the office and get her instructions.   She voiced understanding and the call was disconnected.

## 2015-03-14 ENCOUNTER — Encounter (HOSPITAL_COMMUNITY)
Admission: RE | Disposition: A | Discharge status: Home / Self Care | Payer: Self-pay | Source: Ambulatory Visit | Attending: Internal Medicine

## 2015-03-14 ENCOUNTER — Ambulatory Visit (HOSPITAL_COMMUNITY): Payer: Medicaid Other | Admitting: Anesthesiology

## 2015-03-14 ENCOUNTER — Encounter (HOSPITAL_COMMUNITY): Payer: Self-pay | Admitting: *Deleted

## 2015-03-14 ENCOUNTER — Ambulatory Visit (HOSPITAL_COMMUNITY)
Admission: RE | Admit: 2015-03-14 | Discharge: 2015-03-14 | Disposition: A | Discharge status: Home / Self Care | Payer: Medicaid Other | Source: Ambulatory Visit | Attending: Internal Medicine | Admitting: Internal Medicine

## 2015-03-14 ENCOUNTER — Other Ambulatory Visit (HOSPITAL_COMMUNITY): Payer: Self-pay | Admitting: Oncology

## 2015-03-14 ENCOUNTER — Other Ambulatory Visit (HOSPITAL_COMMUNITY): Payer: Self-pay | Admitting: Hematology & Oncology

## 2015-03-14 DIAGNOSIS — F17210 Nicotine dependence, cigarettes, uncomplicated: Secondary | ICD-10-CM | POA: Diagnosis not present

## 2015-03-14 DIAGNOSIS — R1013 Epigastric pain: Secondary | ICD-10-CM | POA: Insufficient documentation

## 2015-03-14 DIAGNOSIS — Z8544 Personal history of malignant neoplasm of other female genital organs: Secondary | ICD-10-CM | POA: Diagnosis not present

## 2015-03-14 DIAGNOSIS — J449 Chronic obstructive pulmonary disease, unspecified: Secondary | ICD-10-CM | POA: Insufficient documentation

## 2015-03-14 DIAGNOSIS — R1314 Dysphagia, pharyngoesophageal phase: Secondary | ICD-10-CM

## 2015-03-14 DIAGNOSIS — K3189 Other diseases of stomach and duodenum: Secondary | ICD-10-CM | POA: Diagnosis not present

## 2015-03-14 DIAGNOSIS — R1310 Dysphagia, unspecified: Secondary | ICD-10-CM | POA: Insufficient documentation

## 2015-03-14 DIAGNOSIS — J45909 Unspecified asthma, uncomplicated: Secondary | ICD-10-CM | POA: Diagnosis not present

## 2015-03-14 DIAGNOSIS — Z90710 Acquired absence of both cervix and uterus: Secondary | ICD-10-CM | POA: Insufficient documentation

## 2015-03-14 DIAGNOSIS — K5900 Constipation, unspecified: Secondary | ICD-10-CM | POA: Insufficient documentation

## 2015-03-14 DIAGNOSIS — K2100 Gastro-esophageal reflux disease with esophagitis, without bleeding: Secondary | ICD-10-CM | POA: Insufficient documentation

## 2015-03-14 DIAGNOSIS — K648 Other hemorrhoids: Secondary | ICD-10-CM | POA: Insufficient documentation

## 2015-03-14 DIAGNOSIS — Z885 Allergy status to narcotic agent status: Secondary | ICD-10-CM | POA: Diagnosis not present

## 2015-03-14 DIAGNOSIS — K449 Diaphragmatic hernia without obstruction or gangrene: Secondary | ICD-10-CM | POA: Insufficient documentation

## 2015-03-14 DIAGNOSIS — R918 Other nonspecific abnormal finding of lung field: Secondary | ICD-10-CM | POA: Diagnosis not present

## 2015-03-14 DIAGNOSIS — K921 Melena: Secondary | ICD-10-CM

## 2015-03-14 DIAGNOSIS — K5730 Diverticulosis of large intestine without perforation or abscess without bleeding: Secondary | ICD-10-CM

## 2015-03-14 DIAGNOSIS — F419 Anxiety disorder, unspecified: Secondary | ICD-10-CM | POA: Diagnosis not present

## 2015-03-14 DIAGNOSIS — K2950 Unspecified chronic gastritis without bleeding: Secondary | ICD-10-CM | POA: Diagnosis not present

## 2015-03-14 DIAGNOSIS — K228 Other specified diseases of esophagus: Secondary | ICD-10-CM | POA: Insufficient documentation

## 2015-03-14 HISTORY — PX: COLONOSCOPY WITH PROPOFOL: SHX5780

## 2015-03-14 HISTORY — PX: ESOPHAGEAL DILATION: SHX303

## 2015-03-14 HISTORY — PX: ESOPHAGOGASTRODUODENOSCOPY (EGD) WITH PROPOFOL: SHX5813

## 2015-03-14 HISTORY — PX: BIOPSY: SHX5522

## 2015-03-14 SURGERY — COLONOSCOPY WITH PROPOFOL
Anesthesia: Monitor Anesthesia Care

## 2015-03-14 MED ORDER — SIMETHICONE 40 MG/0.6ML PO SUSP
ORAL | Status: DC | PRN
  Administered 2015-03-14: 1000 mL

## 2015-03-14 MED ORDER — MIDAZOLAM HCL 2 MG/2ML IJ SOLN
INTRAMUSCULAR | Status: AC
  Filled 2015-03-14: qty 2

## 2015-03-14 MED ORDER — FENTANYL CITRATE (PF) 100 MCG/2ML IJ SOLN
INTRAMUSCULAR | Status: AC
  Filled 2015-03-14: qty 2

## 2015-03-14 MED ORDER — FENTANYL CITRATE (PF) 100 MCG/2ML IJ SOLN
25.0000 ug | INTRAMUSCULAR | Status: AC
  Administered 2015-03-14 (×2): 25 ug via INTRAVENOUS

## 2015-03-14 MED ORDER — ONDANSETRON HCL 4 MG/2ML IJ SOLN
INTRAMUSCULAR | Status: AC
  Filled 2015-03-14: qty 2

## 2015-03-14 MED ORDER — ONDANSETRON HCL 4 MG/2ML IJ SOLN: 4.0000 mg | Freq: Once | INTRAMUSCULAR | Status: DC | PRN

## 2015-03-14 MED ORDER — LACTATED RINGERS IV SOLN
INTRAVENOUS | Status: DC
  Administered 2015-03-14: 11:00:00 via INTRAVENOUS

## 2015-03-14 MED ORDER — PROPOFOL 10 MG/ML IV BOLUS
INTRAVENOUS | Status: AC
  Filled 2015-03-14: qty 20

## 2015-03-14 MED ORDER — MIDAZOLAM HCL 5 MG/5ML IJ SOLN
INTRAMUSCULAR | Status: DC | PRN
  Administered 2015-03-14: 2 mg via INTRAVENOUS

## 2015-03-14 MED ORDER — MIDAZOLAM HCL 2 MG/2ML IJ SOLN
1.0000 mg | INTRAMUSCULAR | Status: DC | PRN
  Administered 2015-03-14: 2 mg via INTRAVENOUS

## 2015-03-14 MED ORDER — LIDOCAINE VISCOUS 2 % MT SOLN
OROMUCOSAL | Status: AC
  Filled 2015-03-14: qty 15

## 2015-03-14 MED ORDER — LIDOCAINE VISCOUS 2 % MT SOLN
6.0000 mL | Freq: Once | OROMUCOSAL | Status: AC
  Administered 2015-03-14: 6 mL via OROMUCOSAL

## 2015-03-14 MED ORDER — ONDANSETRON HCL 4 MG/2ML IJ SOLN
4.0000 mg | Freq: Once | INTRAMUSCULAR | Status: AC
  Administered 2015-03-14: 4 mg via INTRAVENOUS

## 2015-03-14 MED ORDER — FENTANYL CITRATE (PF) 100 MCG/2ML IJ SOLN: 25.0000 ug | INTRAMUSCULAR | Status: DC | PRN

## 2015-03-14 MED ORDER — FENTANYL CITRATE (PF) 100 MCG/2ML IJ SOLN
INTRAMUSCULAR | Status: DC | PRN
  Administered 2015-03-14 (×4): 25 ug via INTRAVENOUS

## 2015-03-14 MED ORDER — PROPOFOL INFUSION 10 MG/ML OPTIME
INTRAVENOUS | Status: DC | PRN
  Administered 2015-03-14: 12:00:00 via INTRAVENOUS
  Administered 2015-03-14: 150 ug/kg/min via INTRAVENOUS

## 2015-03-14 MED ORDER — WATER FOR IRRIGATION, STERILE IR SOLN
Status: DC | PRN
  Administered 2015-03-14: 1000 mL

## 2015-03-14 MED ORDER — LIDOCAINE HCL (CARDIAC) 10 MG/ML IV SOLN
INTRAVENOUS | Status: DC | PRN
  Administered 2015-03-14: 50 mg via INTRAVENOUS

## 2015-03-14 SURGICAL SUPPLY — 34 items
BALLN CRE LF 10-12 240X5.5 (BALLOONS)
BALLN CRE LF 10-12MM 240X5.5 (BALLOONS)
BALLN DILATOR CRE 12-15 240 (BALLOONS)
BALLN DILATOR CRE 15-18 240 (BALLOONS) IMPLANT
BALLN DILATOR CRE 18-20 240 (BALLOONS) IMPLANT
BALLN DILATOR CRE WIREGUIDE (BALLOONS)
BALLOON CRE LF 10-12 240X5.5 (BALLOONS) IMPLANT
BALLOON DILATOR CRE 12-15 240 (BALLOONS) IMPLANT
BALLOON DILATOR CRE WIREGUIDE (BALLOONS) IMPLANT
BLOCK BITE 60FR ADLT L/F BLUE (MISCELLANEOUS) ×3 IMPLANT
DEVICE CLIP HEMOSTAT 235CM (CLIP) IMPLANT
ELECT REM PT RETURN 9FT ADLT (ELECTROSURGICAL)
ELECTRODE REM PT RTRN 9FT ADLT (ELECTROSURGICAL) IMPLANT
FCP BXJMBJMB 240X2.8X (CUTTING FORCEPS)
FLOOR PAD 36X40 (MISCELLANEOUS)
FORCEPS BIOP RAD 4 LRG CAP 4 (CUTTING FORCEPS) ×3 IMPLANT
FORCEPS BIOP RJ4 240 W/NDL (CUTTING FORCEPS)
FORCEPS BXJMBJMB 240X2.8X (CUTTING FORCEPS) IMPLANT
FORMALIN 10 PREFIL 20ML (MISCELLANEOUS) ×3 IMPLANT
INJECTOR/SNARE I SNARE (MISCELLANEOUS) IMPLANT
KIT ENDO PROCEDURE PEN (KITS) ×3 IMPLANT
MANIFOLD NEPTUNE II (INSTRUMENTS) ×3 IMPLANT
NEEDLE SCLEROTHERAPY 25GX240 (NEEDLE) IMPLANT
PAD FLOOR 36X40 (MISCELLANEOUS) IMPLANT
PROBE APC STR FIRE (PROBE) IMPLANT
PROBE INJECTION GOLD (MISCELLANEOUS)
PROBE INJECTION GOLD 7FR (MISCELLANEOUS) IMPLANT
SNARE ROTATE MED OVAL 20MM (MISCELLANEOUS) IMPLANT
SNARE SHORT THROW 13M SML OVAL (MISCELLANEOUS) IMPLANT
SYR INFLATE BILIARY GAUGE (MISCELLANEOUS) IMPLANT
SYR INFLATION 60ML (SYRINGE) ×3 IMPLANT
TRAP SPECIMEN MUCOUS 40CC (MISCELLANEOUS) IMPLANT
TUBING IRRIGATION ENDOGATOR (MISCELLANEOUS) ×3 IMPLANT
WATER STERILE IRR 1000ML POUR (IV SOLUTION) ×3 IMPLANT

## 2015-03-14 NOTE — Anesthesia Preprocedure Evaluation (Addendum)
Anesthesia Evaluation  Patient identified by MRN, date of birth, ID band Patient awake    Reviewed: Allergy & Precautions, NPO status , Patient's Chart, lab work & pertinent test results  Airway Mallampati: I  TM Distance: >3 FB Neck ROM: Full    Dental  (+) Teeth Intact   Pulmonary asthma , COPD COPD inhaler, Current Smoker,  breath sounds clear to auscultation        Cardiovascular negative cardio ROS  Rhythm:Regular Rate:Normal     Neuro/Psych PSYCHIATRIC DISORDERS Depression    GI/Hepatic Hiatal hernia: dysphagia., GERD-  Medicated,  Endo/Other    Renal/GU      Musculoskeletal   Abdominal   Peds  Hematology  (+) anemia ,   Anesthesia Other Findings   Reproductive/Obstetrics                            Anesthesia Physical Anesthesia Plan  ASA: II  Anesthesia Plan: MAC   Post-op Pain Management:    Induction: Intravenous  Airway Management Planned: Simple Face Mask  Additional Equipment:   Intra-op Plan:   Post-operative Plan:   Informed Consent: I have reviewed the patients History and Physical, chart, labs and discussed the procedure including the risks, benefits and alternatives for the proposed anesthesia with the patient or authorized representative who has indicated his/her understanding and acceptance.     Plan Discussed with:   Anesthesia Plan Comments:         Anesthesia Quick Evaluation

## 2015-03-14 NOTE — Op Note (Signed)
[facility] [address] [place], [address]   ENDOSCOPY PROCEDURE REPORT  PATIENT: Julia Osborne, Julia Osborne  MR#: [id number] BIRTHDATE: [birth year] , 53  yrs. old GENDER: female ENDOSCOPIST: R.  Garfield Cornea, MD FACP Marval Regal REFERRED BY:  Thornton, MD PROCEDURE DATE:  03/17/15 PROCEDURE:  EGD w/ biopsy and Maloney dilation of esophagus INDICATIONS:  Worsening GERD; esophageal dysphagia. MEDICATIONS: Deep sedation per Dr.  [facility] ASA CLASS:      Class III  CONSENT: The risks, benefits, limitations, alternatives and imponderables have been discussed.  The potential for biopsy, esophogeal dilation, etc. have also been reviewed.  Questions have been answered.  All parties agreeable.  Please see the history and physical in the medical record for more information.  DESCRIPTION OF PROCEDURE: After the risks benefits and alternatives of the procedure were thoroughly explained, informed consent was obtained.  The    endoscope was introduced through the mouth and advanced to the second portion of the duodenum , limited by Without limitations. The instrument was slowly withdrawn as the mucosa was fully examined. Estimated blood loss is zero unless otherwise noted in this procedure report.    Circumferential distal esophageal erosions within 3 mm of the GE junction.  The tubular esophagus appeared patent throughout its course.  No Barrett's esophagus or other abnormality.  Stomach empty.  3 cm hiatal hernia.  (3) 6-7 mm areas of focal abnormality in the gastric body characterized by a yellow "halo"appearance with injected appearing central area.  No obvious ulceration or infiltrating process.  These were a bit strange looking. Otherwise, remainder the gastric mucosa.  Unremarkable Pylorus patent.  Normal-appearing first and second portion of the duodenum.  The scope was withdrawn and a 61 [place] Maloney dilator was passed a full  insertion easily.  A look back revealed a small tear through the upper esophageal mucosa , otherwise, no apparent complication related to this maneuver.  Subsequently, biopsies of the abnormal gastric lesions were taken.  EBL 5 mL.  Retroflexed views revealed as previously described. The scope was then withdrawn from the patient and the procedure completed.  COMPLICATIONS: There were no immediate complications.  ENDOSCOPIC IMPRESSION: Mild erosive reflux esophagitis?"status post passage of a Maloney dilator. Abnormal gastric mucosa of uncertain significance as described above?"status post biopsy  RECOMMENDATIONS: Stop omeprazole; begin Dexilant 60 mg daily. Follow-up on pathology. See colonoscopy report.  REPEAT EXAM:  eSigned:  R. Garfield Cornea, MD Rosalita Chessman [facility] 03/17/15 12:57 PM    CC:  CPT CODES: ICD CODES:  The ICD and CPT codes recommended by this software are interpretations from the data that the clinical staff has captured with the software.  The verification of the translation of this report to the ICD and CPT codes and modifiers is the sole responsibility of the health care institution and practicing physician where this report was generated.  [employer] will not be held responsible for the validity of the ICD and CPT codes included on this report.  AMA assumes no liability for data contained or not contained herein. CPT is a Designer, television/film set of the [employer].  PATIENT NAME:  Julia Osborne, Julia Osborne MR#: [id number]

## 2015-03-14 NOTE — Interval H&P Note (Signed)
History and Physical Interval Note:  03/14/2015 11:21 AM  Julia Osborne  has presented today for surgery, with the diagnosis of dysphagia/rectal bleeding/gerd  The various methods of treatment have been discussed with the patient and family. After consideration of risks, benefits and other options for treatment, the patient has consented to  Procedure(s): COLONOSCOPY WITH PROPOFOL (N/A) ESOPHAGOGASTRODUODENOSCOPY (EGD) WITH PROPOFOL (N/A) ESOPHAGEAL DILATION (N/A) as a surgical intervention .  The patient's history has been reviewed, patient examined, no change in status, stable for surgery.  I have reviewed the patient's chart and labs.  Questions were answered to the patient's satisfaction.     Robert Rourk  No change. EGD with esophageal dilation, etc. and diagnostic colonoscopy today per plan.  The risks, benefits, limitations, imponderables and alternatives regarding both EGD and colonoscopy have been reviewed with the patient. Questions have been answered. All parties agreeable.

## 2015-03-14 NOTE — Op Note (Signed)
[facility] [address] [place], [address]   COLONOSCOPY PROCEDURE REPORT  PATIENT: Julia Osborne, Julia Osborne  MR#: [id number] BIRTHDATE: 12-29-1960 , 53  yrs. old GENDER: female ENDOSCOPIST: R.  Garfield Cornea, MD FACP FACG REFERRED FM:[facility] Ancil Linsey, MD PROCEDURE DATE:  2015/04/12 PROCEDURE:   Colonoscopy, diagnostic  (attempted) INDICATIONS:Hematochezia. MEDICATIONS: deep sedation per Dr.  [facility] ASA CLASS:       Class III  CONSENT: The risks, benefits, alternatives and imponderables including but not limited to bleeding, perforation as well as the possibility of a missed lesion have been reviewed.  The potential for biopsy, lesion removal, etc. have also been discussed. Questions have been answered.  All parties agreeable.  Please see the history and physical in the medical record for more information.  DESCRIPTION OF PROCEDURE:   After the risks benefits and alternatives of the procedure were thoroughly explained, informed consent was obtained.  The digital rectal exam revealed no rectal mass.   The     endoscope was introduced through the anus and advanced to the descending colon. No adverse events experienced. The quality of the prep was inadequate  The instrument was then slowly withdrawn as the colon was fully examined. Estimated blood loss is zero unless otherwise noted in this procedure report.      COLON FINDINGS: Examination the rectal mucosa revealed prominent internal hemorrhoids and anal papilla only.  prep inadequate. Patient had sigmoid diverticula.  Formed stool within the lumen of the sigmoid colon precluded complete examination of the descending or any other proximal segments.  This was an incomplete examination.  Retroflexion was performed. .  Withdrawal time= not applicable     .  The scope was withdrawn and the procedure completed. COMPLICATIONS: There were no immediate  complications.  ENDOSCOPIC IMPRESSION: Internal hemorrhoids. Colonic diverticulosis. Incomplete examination. Preparation inadequate  RECOMMENDATIONS: We'll have the patient return to the office in the next several weeks and offer her the opportunity to  reschedule procedure. See EGD report.  eSigned:  R. Garfield Cornea, MD Rosalita Chessman [facility] 04-12-15 1:02 PM   cc:  CPT CODES: ICD CODES:  The ICD and CPT codes recommended by this software are interpretations from the data that the clinical staff has captured with the software.  The verification of the translation of this report to the ICD and CPT codes and modifiers is the sole responsibility of the health care institution and practicing physician where this report was generated.  [employer] will not be held responsible for the validity of the ICD and CPT codes included on this report.  AMA assumes no liability for data contained or not contained herein. CPT is a Designer, television/film set of the [employer].

## 2015-03-14 NOTE — H&P (View-Only) (Signed)
Primary Care Physician:  [place]  Primary Gastroenterologist:  Garfield Cornea, MD   Chief Complaint  Patient presents with  . set up TCS    HPI:  Julia Osborne is a 54 y.o. female here for consideration of colonoscopy and to evaluate UGi symptoms. She was diagnosed stage IV endometrial cancer, probable pulmonary metastases back in October 2015. Underwent radical hysterectomy, bilateral salpingo-oophorectomy, pelvic lymph node dissection and vaginal biopsies in November 2015. She had extensive pelvic disease including positive lymph nodes and positive distal vaginal biopsy. She has completed chemotherapy. Her last scans as outlined below. She had mild sigmoid colon wall thickening and diverticulosis without surrounding inflammatory change. Describes having a colonoscopy 10-15 years ago at [facility], cannot remember the results.  She reports that she has always had stomach problems. Seem to gotten worse since her surgery and chemotherapy. Developed constant heartburn, wakes up in the middle of night with pressure in the mid chest, somewhat improved with belching. Lots of nausea with intermittent vomiting of foamy bitter stuff. Complains of solid food and large pill dysphagia. Symptoms associated with epigastric pain. Epigastric pain is worse with meals. Describes some early satiety. Believes her weight has stabilized. Lost a lot of weight prior to her diagnosis and gain most of this back with steroids. For heartburn and epigastric pain, she's tried omeprazole 40 mg daily. Really hasn't noticed any improvement. Takes Zofran 1-2 times daily for nausea with good results. Tendency towards constipation however states she's been very regular the last 6 months. Daily bowel movement. Previously would see bright red blood per rectum and passed a hard stool. Denies any melena.  Patient states that she takes tramadol, Celexa, trazodone every night. She takes Xanax rarely. She is  anxious about the procedure wants to make sure she is adequately sedated.     Current Outpatient Prescriptions  Medication Sig Dispense Refill  . albuterol (PROVENTIL HFA;VENTOLIN HFA) 108 (90 BASE) MCG/ACT inhaler Inhale 2 puffs into the lungs every 2 (two) hours as needed for wheezing or shortness of breath (cough). (Patient taking differently: Inhale 1 puff into the lungs every 6 (six) hours as needed for wheezing or shortness of breath (cough). ) 1 Inhaler 0  . ALPRAZolam (XANAX) 0.25 MG tablet Take 0.25 mg by mouth 3 (three) times daily as needed for anxiety. Pt unsure of dose    . citalopram (CELEXA) 20 MG tablet Take 20 mg by mouth daily.    Marland Kitchen loratadine-pseudoephedrine (CLARITIN-D 12-HOUR) 5-120 MG per tablet Take 1 tablet by mouth 2 (two) times daily.    Marland Kitchen omeprazole (PRILOSEC) 40 MG capsule Take 1 capsule (40 mg total) by mouth daily. 30 capsule 2  . ondansetron (ZOFRAN) 8 MG tablet Take 1 tablet (8 mg total) by mouth every 8 (eight) hours as needed for nausea or vomiting. 30 tablet 2  . traMADol (ULTRAM) 50 MG tablet Take 1 tablet (50 mg total) by mouth every 6 (six) hours as needed. 30 tablet 1  . traZODone (DESYREL) 50 MG tablet Take 50 mg by mouth at bedtime.     No current facility-administered medications for this visit.    Allergies as of 02/14/2015 - Review Complete 02/14/2015  Allergen Reaction Noted  . Codeine Rash 08/25/2011    Past Medical History  Diagnosis Date  . Asthma   . Uterine fibroid   . Cancer     endometrial  . COPD (chronic obstructive pulmonary disease)     no definite diagnosis  .  Anemia   . Vaginal Pap smear, abnormal   . Indigestion     Past Surgical History  Procedure Laterality Date  . Tubal ligation    . Appendectomy    . Abdominal hysterectomy    . Portacath placement Right 09/2014    Family History  Problem Relation Age of Onset  . Other Mother     clot that went to heart, deceased age 81ss  . Heart attack Father     age 2s,  deceased  . Stroke Father   . Cancer Sister     kidney  . Hypertension Sister   . Diabetes Brother   . Hypertension Brother   . Endometriosis Daughter   . Heart attack Maternal Grandfather   . Diabetes Sister   . Colon cancer Neg Hx     History   Social History  . Marital Status: Single    Spouse Name: N/A  . Number of Children: 2  . Years of Education: N/A   Occupational History  . Not on file.   Social History Main Topics  . Smoking status: Current Every Day Smoker -- 0.00 packs/day for 25 years    Types: Cigarettes  . Smokeless tobacco: Not on file     Comment: smokes 1 pack per week  . Alcohol Use: No  . Drug Use: No  . Sexual Activity: Not Currently    Birth Control/ Protection: Surgical     Comment: hyst   Other Topics Concern  . Not on file   Social History Narrative      ROS:  General: Negative for fever, chills. See hpi. Eyes: Negative for vision changes.  ENT: Negative for  nasal congestion. C/o raspy voice. See hpi. CV: Negative for chest pain, angina, palpitations, dyspnea on exertion, peripheral edema.  Respiratory: Negative for dyspnea at rest, dyspnea on exertion, cough, sputum, wheezing.  GI: See history of present illness. GU:  Negative for dysuria, hematuria, urinary incontinence, urinary frequency, nocturnal urination.  MS: Negative for joint pain, low back pain.  Derm: Negative for rash or itching.  Neuro: Negative for weakness, abnormal sensation, seizure, frequent headaches, memory loss, confusion.  Psych: Negative for anxiety, depression, suicidal ideation, hallucinations.  Endo: Negative for unusual weight change. See hpi. Heme: Negative for bruising or bleeding. Allergy: Negative for rash or hives.    Physical Examination:  BP 129/78 mmHg  Pulse 71  Temp(Src) 97.9 F (36.6 C)  Ht 5\' 3"  (1.6 m)  Wt 153 lb 6.4 oz (69.582 kg)  BMI 27.18 kg/m2   General: Well-nourished, well-developed in no acute distress.  Head:  Normocephalic, atraumatic.   Eyes: Conjunctiva pink, no icterus. Mouth: Oropharyngeal mucosa moist and pink , no lesions erythema or exudate. Neck: Supple without thyromegaly, masses, or lymphadenopathy.  Lungs: Clear to auscultation bilaterally.  Heart: Regular rate and rhythm, no murmurs rubs or gallops.  Abdomen: Bowel sounds are normal, nontender, nondistended, no hepatosplenomegaly or masses, no abdominal bruits or    hernia , no rebound or guarding.  Well healed mid line incision Rectal: deferred Extremities: No lower extremity edema. No clubbing or deformities.  Neuro: Alert and oriented x 4 , grossly normal neurologically.  Skin: Warm and dry, no rash or jaundice.   Psych: Alert and cooperative, normal mood and affect.  Labs: Lab Results  Component Value Date   WBC 6.0 02/08/2015   HGB 11.8* 02/08/2015   HCT 35.0* 02/08/2015   MCV 99.4 02/08/2015   PLT 260 02/08/2015   Lab  Results  Component Value Date   CREATININE 0.71 02/08/2015   BUN 13 02/08/2015   NA 140 02/08/2015   K 4.3 02/08/2015   CL 105 02/08/2015   CO2 27 02/08/2015   Lab Results  Component Value Date   ALT 39 02/08/2015   AST 24 02/08/2015   ALKPHOS 76 02/08/2015   BILITOT 0.6 02/08/2015   Lab Results  Component Value Date   CREATININE 0.71 02/08/2015   BUN 13 02/08/2015   NA 140 02/08/2015   K 4.3 02/08/2015   CL 105 02/08/2015   CO2 27 02/08/2015     Imaging Studies:   CLINICAL DATA: Restaging endometrial carcinoma status post 3 cycles of chemotherapy, last administered 3 weeks ago. Initial diagnosis 07/30/2014 with subsequent hysterectomy. Subsequent encounter.  EXAM: CT CHEST, ABDOMEN, AND PELVIS WITH CONTRAST  TECHNIQUE: Multidetector CT imaging of the chest, abdomen and pelvis was performed following the standard protocol during bolus administration of intravenous contrast.  CONTRAST: 175mL OMNIPAQUE IOHEXOL 300 MG/ML SOLN  COMPARISON: Abdominal pelvic CT 08/31/2007.  Pelvic ultrasound 02/15/2014.  FINDINGS: CT CHEST FINDINGS  Mediastinum/Nodes: There are no enlarged mediastinal, hilar or axillary lymph nodes. The thyroid gland, trachea and esophagus demonstrate no significant findings. The heart size is normal. There is no pericardial effusion.Right IJ Port-A-Cath tip is in the upper right atrium. There are no significant vascular findings.  Lungs/Pleura: There is no pleural effusion.5 mm subpleural nodule anteriorly in the right middle lobe on image number 3 T8 was not clearly imaged on the prior abdominal CT and is probably a lymph node. No suspicious pulmonary nodules or confluent airspace opacities are demonstrated.  Musculoskeletal/Chest wall: No evidence of chest wall mass or suspicious osseous finding.  CT ABDOMEN AND PELVIS FINDINGS  Hepatobiliary: The liver is normal in density without focal abnormality. No evidence of gallstones, gallbladder wall thickening or biliary dilatation.  Pancreas: Unremarkable. No pancreatic ductal dilatation or surrounding inflammatory changes.  Spleen: Normal in size without focal abnormality.  Adrenals/Urinary Tract: Both adrenal glands appear normal.The kidneys appear normal without evidence of urinary tract calculus, suspicious lesion or hydronephrosis. No bladder abnormalities are seen.  Stomach/Bowel: There is mild sigmoid colon wall thickening and diverticulosis without surrounding inflammatory change. There is mildly prominent stool throughout the colon. No small bowel wall thickening, bowel distension or extraluminal fluid collection demonstrated.  Vascular/Lymphatic: 7 mm left periaortic node on image 72 and 8 mm left common iliac node on image number 80 are unchanged from 2008, consistent with a benign etiology. No new or enlarging abdominal pelvic lymph nodes identified. Mild aortoiliac atherosclerosis noted.  Reproductive: Status post interval total hysterectomy. No  evidence of adnexal mass, pelvic inflammatory process or fluid collection.  Other: Postsurgical changes within the infraumbilical anterior abdominal wall. No evidence of abdominal wall mass or hernia.  Musculoskeletal: No acute or significant osseous findings. Degenerative disc disease noted at L5-S1.  IMPRESSION: 1. Status post total hysterectomy. No evidence of residual pelvic mass. 2. Stable mildly prominent retroperitoneal lymph nodes from 2008. No evidence of metastatic disease. 3. 5 mm subpleural right middle lobe nodule, likely a benign incidental finding. Clinic notes indicate outside imaging demonstrating pulmonary nodules suspicious for metastatic disease. Direct comparison with outside imaging and/or continued follow up suggested. 4. Mild sigmoid diverticulosis.   Electronically Signed  By: Richardean Sale M.D.  On: 11/20/2014 09:41

## 2015-03-14 NOTE — Anesthesia Procedure Notes (Signed)
Procedure Name: MAC Date/Time: 03/14/2015 11:27 AM Performed by: Andree Elk, AMY A Pre-anesthesia Checklist: Patient identified, Timeout performed, Emergency Drugs available, Suction available and Patient being monitored Patient Re-evaluated:Patient Re-evaluated prior to inductionOxygen Delivery Method: Simple face mask

## 2015-03-14 NOTE — Transfer of Care (Signed)
Immediate Anesthesia Transfer of Care Note  Patient: Julia Osborne  Procedure(s) Performed: Procedure(s) with comments: COLONOSCOPY WITH PROPOFOL (N/A) - incomplete procedure, inadequate prep ESOPHAGOGASTRODUODENOSCOPY (EGD) WITH PROPOFOL (N/A) - procedure 1 ESOPHAGEAL DILATION (N/A) - [name] 54 BIOPSY (N/A) - Gastric  Patient Location: PACU  Anesthesia Type:MAC  Level of Consciousness: awake, alert , oriented and patient cooperative  Airway & Oxygen Therapy: Patient Spontanous Breathing and Patient connected to nasal cannula oxygen  Post-op Assessment: Report given to RN and Post -op Vital signs reviewed and stable  Post vital signs: Reviewed and stable  Last Vitals:  Filed Vitals:   03/14/15 1115  BP: 121/73  Pulse:   Temp:   Resp: 10    Complications: No apparent anesthesia complications

## 2015-03-14 NOTE — Discharge Instructions (Signed)
EGD Discharge instructions Please read the instructions outlined below and refer to this sheet in the next few weeks. These discharge instructions provide you with general information on caring for yourself after you leave the hospital. Your doctor may also give you specific instructions. While your treatment has been planned according to the most current medical practices available, unavoidable complications occasionally occur. If you have any problems or questions after discharge, please call your doctor. ACTIVITY  You may resume your regular activity but move at a slower pace for the next 24 hours.   Take frequent rest periods for the next 24 hours.   Walking will help expel (get rid of) the air and reduce the bloated feeling in your abdomen.   No driving for 24 hours (because of the anesthesia (medicine) used during the test).   You may shower.  Do not sign any important legal documents or operate any machinery for 24 hours (because of the anesthesia used during the test).  PATIENT INSTRUCTIONS POST-ANESTHESIA  IMMEDIATELY FOLLOWING SURGERY:  Do not drive or operate machinery for the first twenty four hours after surgery.  Do not make any important decisions for twenty four hours after surgery or while taking narcotic pain medications or sedatives.  If you develop intractable nausea and vomiting or a severe headache please notify your doctor immediately.  FOLLOW-UP:  Please make an appointment with your surgeon as instructed. You do not need to follow up with anesthesia unless specifically instructed to do so.  WOUND CARE INSTRUCTIONS (if applicable):  Keep a dry clean dressing on the anesthesia/puncture wound site if there is drainage.  Once the wound has quit draining you may leave it open to air.  Generally you should leave the bandage intact for twenty four hours unless there is drainage.  If the epidural site drains for more than 36-48 hours please call the anesthesia  department.  QUESTIONS?:  Please feel free to call your physician or the hospital operator if you have any questions, and they will be happy to assist you.        NUTRITION  Drink plenty of fluids.   You may resume your normal diet.   Begin with a light meal and progress to your normal diet.   Avoid alcoholic beverages for 24 hours or as instructed by your caregiver.  MEDICATIONS  You may resume your normal medications unless your caregiver tells you otherwise.  WHAT YOU CAN EXPECT TODAY  You may experience abdominal discomfort such as a feeling of fullness or gas pains.  FOLLOW-UP  Your doctor will discuss the results of your test with you.  SEEK IMMEDIATE MEDICAL ATTENTION IF ANY OF THE FOLLOWING OCCUR:  Excessive nausea (feeling sick to your stomach) and/or vomiting.   Severe abdominal pain and distention (swelling).   Trouble swallowing.   Temperature over 101 F (37.8 C).   Rectal bleeding or vomiting of blood.   GERD information provided  Stop omeprazole; begin Dexilant 60 mg daily  Office visit with [place] in 4-6 weeks  Further recommendations to follow pending review of pathology report    Colonoscopy Discharge Instructions  Read the instructions outlined below and refer to this sheet in the next few weeks. These discharge instructions provide you with general information on caring for yourself after you leave the hospital. Your doctor may also give you specific instructions. While your treatment has been planned according to the most current medical practices available, unavoidable complications occasionally occur. If you have any problems or  questions after discharge, call Dr. Gala Romney at [phone]. ACTIVITY  You may resume your regular activity, but move at a slower pace for the next 24 hours.   Take frequent rest periods for the next 24 hours.   Walking will help get rid of the air and reduce the bloated feeling in your belly (abdomen).   No driving  for 24 hours (because of the medicine (anesthesia) used during the test).    Do not sign any important legal documents or operate any machinery for 24 hours (because of the anesthesia used during the test).  NUTRITION  Drink plenty of fluids.   You may resume your normal diet as instructed by your doctor.   Begin with a light meal and progress to your normal diet. Heavy or fried foods are harder to digest and may make you feel sick to your stomach (nauseated).   Avoid alcoholic beverages for 24 hours or as instructed.  MEDICATIONS  You may resume your normal medications unless your doctor tells you otherwise.  WHAT YOU CAN EXPECT TODAY  Some feelings of bloating in the abdomen.   Passage of more gas than usual.   Spotting of blood in your stool or on the toilet paper.  IF YOU HAD POLYPS REMOVED DURING THE COLONOSCOPY:  No aspirin products for 7 days or as instructed.   No alcohol for 7 days or as instructed.   Eat a soft diet for the next 24 hours.  FINDING OUT THE RESULTS OF YOUR TEST Not all test results are available during your visit. If your test results are not back during the visit, make an appointment with your caregiver to find out the results. Do not assume everything is normal if you have not heard from your caregiver or the medical facility. It is important for you to follow up on all of your test results.  SEEK IMMEDIATE MEDICAL ATTENTION IF:  You have more than a spotting of blood in your stool.   Your belly is swollen (abdominal distention).   You are nauseated or vomiting.   You have a temperature over 101.   You have abdominal pain or discomfort that is severe or gets worse throughout the day.    Colonoscopy preparation was poor today which prevented complete examination of your colon.  We will reschedule another colonoscopy in the near future.  Dexlansoprazole capsules What is this medicine? DEXLANSOPRAZOLE (dex lan SOE pra zole) prevents the  production of acid in the stomach. It is used to treat gastroesophageal reflux disease (GERD) and inflammation of the esophagus. This medicine may be used for other purposes; ask your health care provider or pharmacist if you have questions. COMMON BRAND NAME(S): Dexilant, Kapidex What should I tell my health care provider before I take this medicine? They need to know if you have any of these conditions: -liver disease -low levels of magnesium in the blood -an unusual or allergic reaction to dexlansoprazole, other medicines, foods, dyes, or preservatives -pregnant or trying to get pregnant -breast-feeding How should I use this medicine? Take this medicine by mouth. Swallow the capsules whole with a drink of water. Follow the directions on the prescription label. Do not crush or chew. Take your medicine at regular intervals. Do not take more often than directed. If you have difficulty swallowing the capsules, you may open the capsule and sprinkle the contents on a tablespoon of applesauce. Do not crush the contents of the capsule into the food. Swallow the dose immediately after preparing  it. Do not chew. Follow with a drink of water. Talk to your pediatrician regarding the use of this medicine in children. Special care may be needed. Overdosage: If you think you've taken too much of this medicine contact a poison control center or emergency room at once. Overdosage: If you think you have taken too much of this medicine contact a poison control center or emergency room at once. NOTE: This medicine is only for you. Do not share this medicine with others. What if I miss a dose? If you miss a dose, take it as soon as you can. If it is almost time for your next dose, take only that dose. Do not take double or extra doses. What may interact with this medicine? Do not take this medicine with any of the following medications: -atazanavir -nelfinavir This medicine may also interact with the following  medications: -ampicillin -digoxin -diuretics -iron salts -itraconazole -ketoconazole -warfarin This list may not describe all possible interactions. Give your health care provider a list of all the medicines, herbs, non-prescription drugs, or dietary supplements you use. Also tell them if you smoke, drink alcohol, or use illegal drugs. Some items may interact with your medicine. What should I watch for while using this medicine? It can take several days before your stomach pain gets better. Check with your doctor or health care professional if your condition does not start to get better, or if it gets worse. You may need blood work done while you are taking this medicine. What side effects may I notice from receiving this medicine? Side effects that you should report to your doctor or health care professional as soon as possible: -allergic reactions like skin rash, itching or hives, swelling of the face, lips, or tongue -bone, muscle or joint pain -breathing problems -chest pain or chest tightness -dark yellow or brown urine -dizziness -fast, irregular heartbeat -feeling faint or lightheaded -fever or sore throat -muscle spasm -palpitations -redness, blistering, peeling or loosening of the skin, including inside the mouth -seizures -tremors -unusual bleeding or bruising -unusually weak or tired -yellowing of the eyes or skin Side effects that usually do not require medical attention (Report these to your doctor or health care professional if they continue or are bothersome.): -constipation -diarrhea -dry mouth -headache -nausea This list may not describe all possible side effects. Call your doctor for medical advice about side effects. You may report side effects to FDA at 1-800-FDA-1088. Where should I keep my medicine? Keep out of the reach of children. Store at room temperature between 15 and 30 degrees C (59 and 86 degrees F). Protect from moisture. Throw away any unused  medicine after the expiration date. NOTE: This sheet is a summary. It may not cover all possible information. If you have questions about this medicine, talk to your doctor, pharmacist, or health care provider.  2015, [employer] Standard. (2012-03-04 16:36:20)  Gastroesophageal Reflux Disease, Adult Gastroesophageal reflux disease (GERD) happens when acid from your stomach flows up into the esophagus. When acid comes in contact with the esophagus, the acid causes soreness (inflammation) in the esophagus. Over time, GERD may create small holes (ulcers) in the lining of the esophagus. CAUSES   Increased body weight. This puts pressure on the stomach, making acid rise from the stomach into the esophagus.  Smoking. This increases acid production in the stomach.  Drinking alcohol. This causes decreased pressure in the lower esophageal sphincter (valve or ring of muscle between the esophagus and stomach), allowing acid from the  stomach into the esophagus.  Late evening meals and a full stomach. This increases pressure and acid production in the stomach.  A malformed lower esophageal sphincter. Sometimes, no cause is found. SYMPTOMS   Burning pain in the lower part of the mid-chest behind the breastbone and in the mid-stomach area. This may occur twice a week or more often.  Trouble swallowing.  Sore throat.  Dry cough.  Asthma-like symptoms including chest tightness, shortness of breath, or wheezing. DIAGNOSIS  Your caregiver may be able to diagnose GERD based on your symptoms. In some cases, X-rays and other tests may be done to check for complications or to check the condition of your stomach and esophagus. TREATMENT  Your caregiver may recommend over-the-counter or prescription medicines to help decrease acid production. Ask your caregiver before starting or adding any new medicines.  HOME CARE INSTRUCTIONS   Change the factors that you can control. Ask your caregiver for guidance  concerning weight loss, quitting smoking, and alcohol consumption.  Avoid foods and drinks that make your symptoms worse, such as:  Caffeine or alcoholic drinks.  Chocolate.  Peppermint or mint flavorings.  Garlic and onions.  Spicy foods.  Citrus fruits, such as oranges, lemons, or limes.  Tomato-based foods such as sauce, chili, salsa, and pizza.  Fried and fatty foods.  Avoid lying down for the 3 hours prior to your bedtime or prior to taking a nap.  Eat small, frequent meals instead of large meals.  Wear loose-fitting clothing. Do not wear anything tight around your waist that causes pressure on your stomach.  Raise the head of your bed 6 to 8 inches with wood blocks to help you sleep. Extra pillows will not help.  Only take over-the-counter or prescription medicines for pain, discomfort, or fever as directed by your caregiver.  Do not take aspirin, ibuprofen, or other nonsteroidal anti-inflammatory drugs (NSAIDs). SEEK IMMEDIATE MEDICAL CARE IF:   You have pain in your arms, neck, jaw, teeth, or back.  Your pain increases or changes in intensity or duration.  You develop nausea, vomiting, or sweating (diaphoresis).  You develop shortness of breath, or you faint.  Your vomit is green, yellow, black, or looks like coffee grounds or blood.  Your stool is red, bloody, or black. These symptoms could be signs of other problems, such as heart disease, gastric bleeding, or esophageal bleeding. MAKE SURE YOU:   Understand these instructions.  Will watch your condition.  Will get help right away if you are not doing well or get worse. Document Released: 06/17/2005 Document Revised: 11/30/2011 Document Reviewed: 03/27/2011 [facility] Patient Information 2015 [place], [place]. This information is not intended to replace advice given to you by your health care provider. Make sure you discuss any questions you have with your health care provider.

## 2015-03-14 NOTE — Anesthesia Postprocedure Evaluation (Signed)
  Anesthesia Post-op Note  Patient: Julia Osborne  Procedure(s) Performed: Procedure(s) with comments: COLONOSCOPY WITH PROPOFOL (N/A) - incomplete procedure, inadequate prep ESOPHAGOGASTRODUODENOSCOPY (EGD) WITH PROPOFOL (N/A) - procedure 1 ESOPHAGEAL DILATION (N/A) - [place] 56 BIOPSY (N/A) - Gastric  Patient Location: PACU  Anesthesia Type:MAC  Level of Consciousness: awake, alert , oriented and patient cooperative  Airway and Oxygen Therapy: Patient Spontanous Breathing and Patient connected to nasal cannula oxygen  Post-op Pain: none  Post-op Assessment: Post-op Vital signs reviewed, Patient's Cardiovascular Status Stable, Respiratory Function Stable, Patent Airway and No signs of Nausea or vomiting              Post-op Vital Signs: Reviewed and stable  Last Vitals:  Filed Vitals:   03/14/15 1115  BP: 121/73  Pulse:   Temp:   Resp: 10    Complications: No apparent anesthesia complications

## 2015-03-15 ENCOUNTER — Encounter: Payer: Self-pay | Admitting: Internal Medicine

## 2015-03-18 ENCOUNTER — Encounter: Payer: Self-pay | Admitting: Internal Medicine

## 2015-03-18 ENCOUNTER — Encounter (HOSPITAL_COMMUNITY): Payer: Self-pay | Admitting: Internal Medicine

## 2015-03-18 ENCOUNTER — Telehealth: Payer: Self-pay

## 2015-03-18 ENCOUNTER — Telehealth (HOSPITAL_COMMUNITY): Payer: Self-pay | Admitting: *Deleted

## 2015-03-18 NOTE — Telephone Encounter (Signed)
Letter mailed to the pt. 

## 2015-03-18 NOTE — Telephone Encounter (Signed)
#  4 boxes of dexilant given per discharge instructions.

## 2015-03-18 NOTE — Telephone Encounter (Signed)
Ov made and letter mailed °

## 2015-03-18 NOTE — Telephone Encounter (Signed)
Per RMR-  Send letter to patient.  Send copy of letter with path to referring provider and PCP. Patient needs an office visit with extender sometime in the next several weeks to regroup and reschedule a colonoscopy

## 2015-03-21 ENCOUNTER — Encounter (HOSPITAL_COMMUNITY): Payer: Self-pay

## 2015-04-02 ENCOUNTER — Emergency Department (HOSPITAL_COMMUNITY): Payer: Medicaid Other

## 2015-04-02 ENCOUNTER — Emergency Department (HOSPITAL_COMMUNITY)
Admission: EM | Admit: 2015-04-02 | Discharge: 2015-04-02 | Disposition: A | Discharge status: Home / Self Care | Payer: Medicaid Other | Attending: Emergency Medicine | Admitting: Emergency Medicine

## 2015-04-02 ENCOUNTER — Encounter (HOSPITAL_COMMUNITY): Payer: Self-pay | Admitting: Emergency Medicine

## 2015-04-02 DIAGNOSIS — J449 Chronic obstructive pulmonary disease, unspecified: Secondary | ICD-10-CM | POA: Diagnosis not present

## 2015-04-02 DIAGNOSIS — J069 Acute upper respiratory infection, unspecified: Secondary | ICD-10-CM | POA: Insufficient documentation

## 2015-04-02 DIAGNOSIS — Z862 Personal history of diseases of the blood and blood-forming organs and certain disorders involving the immune mechanism: Secondary | ICD-10-CM | POA: Insufficient documentation

## 2015-04-02 DIAGNOSIS — Z8742 Personal history of other diseases of the female genital tract: Secondary | ICD-10-CM | POA: Diagnosis not present

## 2015-04-02 DIAGNOSIS — Z86018 Personal history of other benign neoplasm: Secondary | ICD-10-CM | POA: Insufficient documentation

## 2015-04-02 DIAGNOSIS — Z720 Tobacco use: Secondary | ICD-10-CM | POA: Diagnosis not present

## 2015-04-02 DIAGNOSIS — Z8542 Personal history of malignant neoplasm of other parts of uterus: Secondary | ICD-10-CM | POA: Insufficient documentation

## 2015-04-02 DIAGNOSIS — B9789 Other viral agents as the cause of diseases classified elsewhere: Secondary | ICD-10-CM

## 2015-04-02 DIAGNOSIS — R0981 Nasal congestion: Secondary | ICD-10-CM | POA: Diagnosis present

## 2015-04-02 DIAGNOSIS — Z79899 Other long term (current) drug therapy: Secondary | ICD-10-CM | POA: Diagnosis not present

## 2015-04-02 MED ORDER — FEXOFENADINE-PSEUDOEPHED ER 60-120 MG PO TB12: 1.0000 | ORAL_TABLET | Freq: Two times a day (BID) | ORAL | Status: DC

## 2015-04-02 MED ORDER — HYDROCODONE-ACETAMINOPHEN 5-325 MG PO TABS: 1.0000 | ORAL_TABLET | ORAL | Status: DC | PRN

## 2015-04-02 NOTE — Discharge Instructions (Signed)
Upper Respiratory Infection, Adult An upper respiratory infection (URI) is also sometimes known as the common cold. The upper respiratory tract includes the nose, sinuses, throat, trachea, and bronchi. Bronchi are the airways leading to the lungs. Most people improve within 1 week, but symptoms can last up to 2 weeks. A residual cough may last even longer.  CAUSES Many different viruses can infect the tissues lining the upper respiratory tract. The tissues become irritated and inflamed and often become very moist. Mucus production is also common. A cold is contagious. You can easily spread the virus to others by oral contact. This includes kissing, sharing a glass, coughing, or sneezing. Touching your mouth or nose and then touching a surface, which is then touched by another person, can also spread the virus. SYMPTOMS  Symptoms typically develop 1 to 3 days after you come in contact with a cold virus. Symptoms vary from person to person. They may include:  Runny nose.  Sneezing.  Nasal congestion.  Sinus irritation.  Sore throat.  Loss of voice (laryngitis).  Cough.  Fatigue.  Muscle aches.  Loss of appetite.  Headache.  Low-grade fever. DIAGNOSIS  You might diagnose your own cold based on familiar symptoms, since most people get a cold 2 to 3 times a year. Your caregiver can confirm this based on your exam. Most importantly, your caregiver can check that your symptoms are not due to another disease such as strep throat, sinusitis, pneumonia, asthma, or epiglottitis. Blood tests, throat tests, and X-rays are not necessary to diagnose a common cold, but they may sometimes be helpful in excluding other more serious diseases. Your caregiver will decide if any further tests are required. RISKS AND COMPLICATIONS  You may be at risk for a more severe case of the common cold if you smoke cigarettes, have chronic heart disease (such as heart failure) or lung disease (such as asthma), or if  you have a weakened immune system. The very young and very old are also at risk for more serious infections. Bacterial sinusitis, middle ear infections, and bacterial pneumonia can complicate the common cold. The common cold can worsen asthma and chronic obstructive pulmonary disease (COPD). Sometimes, these complications can require emergency medical care and may be life-threatening. PREVENTION  The best way to protect against getting a cold is to practice good hygiene. Avoid oral or hand contact with people with cold symptoms. Wash your hands often if contact occurs. There is no clear evidence that vitamin C, vitamin E, echinacea, or exercise reduces the chance of developing a cold. However, it is always recommended to get plenty of rest and practice good nutrition. TREATMENT  Treatment is directed at relieving symptoms. There is no cure. Antibiotics are not effective, because the infection is caused by a virus, not by bacteria. Treatment may include:  Increased fluid intake. Sports drinks offer valuable electrolytes, sugars, and fluids.  Breathing heated mist or steam (vaporizer or shower).  Eating chicken soup or other clear broths, and maintaining good nutrition.  Getting plenty of rest.  Using gargles or lozenges for comfort.  Controlling fevers with ibuprofen or acetaminophen as directed by your caregiver.  Increasing usage of your inhaler if you have asthma. Zinc gel and zinc lozenges, taken in the first 24 hours of the common cold, can shorten the duration and lessen the severity of symptoms. Pain medicines may help with fever, muscle aches, and throat pain. A variety of non-prescription medicines are available to treat congestion and runny nose. Your caregiver  can make recommendations and may suggest nasal or lung inhalers for other symptoms.  HOME CARE INSTRUCTIONS   Only take over-the-counter or prescription medicines for pain, discomfort, or fever as directed by your  caregiver.  Use a warm mist humidifier or inhale steam from a shower to increase air moisture. This may keep secretions moist and make it easier to breathe.  Drink enough water and fluids to keep your urine clear or pale yellow.  Rest as needed.  Return to work when your temperature has returned to normal or as your caregiver advises. You may need to stay home longer to avoid infecting others. You can also use a face mask and careful hand washing to prevent spread of the virus. SEEK MEDICAL CARE IF:   After the first few days, you feel you are getting worse rather than better.  You need your caregiver's advice about medicines to control symptoms.  You develop chills, worsening shortness of breath, or brown or red sputum. These may be signs of pneumonia.  You develop yellow or brown nasal discharge or pain in the face, especially when you bend forward. These may be signs of sinusitis.  You develop a fever, swollen neck glands, pain with swallowing, or white areas in the back of your throat. These may be signs of strep throat. SEEK IMMEDIATE MEDICAL CARE IF:   You have a fever.  You develop severe or persistent headache, ear pain, sinus pain, or chest pain.  You develop wheezing, a prolonged cough, cough up blood, or have a change in your usual mucus (if you have chronic lung disease).  You develop sore muscles or a stiff neck. Document Released: 03/03/2001 Document Revised: 11/30/2011 Document Reviewed: 12/13/2013 [facility] Patient Information 2015 [place], [place]. This information is not intended to replace advice given to you by your health care provider. Make sure you discuss any questions you have with your health care provider.   Add the medicines prescribed.  Finish taking your antibiotic.  Warm steam, vicks vapor stick, menthol cough drops can also help relieve your congestion.

## 2015-04-02 NOTE — ED Notes (Signed)
Pt c/o productive cough-yellow sputum, head congestion and body aches x 1 week.

## 2015-04-02 NOTE — ED Provider Notes (Signed)
CSN: [id number]     Arrival date & time 04/02/15  1007 History   First MD Initiated Contact with Patient 04/02/15 1151     Chief Complaint  Patient presents with  . Nasal Congestion     (Consider location/radiation/quality/duration/timing/severity/associated sxs/prior Treatment) The history is provided by the patient.   Julia Osborne is a 54 y.o. female with a history significant for endometrial cancer (treatment completed) presenting with a one week history of cough productive of yellow sputum, nasal congestion with clear rhinorrhea, generalized body aches and subjective fevers along with difficulty sleeping secondary to cough.  She denies shortness of breath, wheezing, abdominal or chest pain, ear, neck or facial pain.  She does endorse generalized headaches which is worsened with cough.  She was seen by her pcp 4 days ago and placed on amoxil, but does not feel better.     Past Medical History  Diagnosis Date  . Asthma   . Uterine fibroid   . Cancer     endometrial  . COPD (chronic obstructive pulmonary disease)     no definite diagnosis  . Anemia   . Vaginal Pap smear, abnormal   . Indigestion    Past Surgical History  Procedure Laterality Date  . Tubal ligation    . Appendectomy    . Abdominal hysterectomy    . Portacath placement Right 09/2014  . Colonoscopy with propofol N/A 03/14/2015    Procedure: COLONOSCOPY WITH PROPOFOL;  Surgeon: Daneil Dolin, MD;  Location: [facility];  Service: Endoscopy;  Laterality: N/A;  incomplete procedure, inadequate prep  . Esophagogastroduodenoscopy (egd) with propofol N/A 03/14/2015    Procedure: ESOPHAGOGASTRODUODENOSCOPY (EGD) WITH PROPOFOL;  Surgeon: Daneil Dolin, MD;  Location: [facility];  Service: Endoscopy;  Laterality: N/A;  procedure 1  . Esophageal dilation N/A 03/14/2015    Procedure: ESOPHAGEAL DILATION;  Surgeon: Daneil Dolin, MD;  Location: [facility];  Service: Endoscopy;  Laterality: N/A;  [place] 85  . Esophageal biopsy N/A  03/14/2015    Procedure: BIOPSY;  Surgeon: Daneil Dolin, MD;  Location: [facility];  Service: Endoscopy;  Laterality: N/A;  Gastric   Family History  Problem Relation Age of Onset  . Other Mother     clot that went to heart, deceased age 68ss  . Heart attack Father     age 55s, deceased  . Stroke Father   . Cancer Sister     kidney  . Hypertension Sister   . Diabetes Brother   . Hypertension Brother   . Endometriosis Daughter   . Heart attack Maternal Grandfather   . Diabetes Sister   . Colon cancer Neg Hx    History  Substance Use Topics  . Smoking status: Current Every Day Smoker -- 0.50 packs/day for 25 years    Types: Cigarettes  . Smokeless tobacco: Not on file     Comment: smokes 1 pack per week  . Alcohol Use: No   OB History    Gravida Para Term Preterm AB TAB SAB Ectopic Multiple Living   '2 2        2     ' Review of Systems  Constitutional: Positive for fever. Negative for chills.  HENT: Positive for congestion, rhinorrhea and sore throat. Negative for ear pain, sinus pressure, trouble swallowing and voice change.   Eyes: Negative for discharge.  Respiratory: Positive for cough. Negative for shortness of breath, wheezing and stridor.   Cardiovascular: Negative for chest pain.  Gastrointestinal: Negative for  abdominal pain.  Genitourinary: Negative.       Allergies  Codeine  Home Medications   Prior to Admission medications   Medication Sig Start Date End Date Taking? Authorizing Provider  albuterol (PROVENTIL HFA;VENTOLIN HFA) 108 (90 BASE) MCG/ACT inhaler Inhale 2 puffs into the lungs every 2 (two) hours as needed for wheezing or shortness of breath (cough). Patient taking differently: Inhale 1 puff into the lungs every 6 (six) hours as needed for wheezing or shortness of breath (cough).  11/19/12  Yes Riki Altes, MD  ALPRAZolam Duanne Moron) 0.25 MG tablet Take 0.25 mg by mouth 3 (three) times daily as needed for anxiety.    Yes Historical Provider, MD   amoxicillin (AMOXIL) 500 MG capsule Take 500 mg by mouth every 12 (twelve) hours. Starting 03/29/2015 x 7 days.   Yes Historical Provider, MD  citalopram (CELEXA) 20 MG tablet Take 20 mg by mouth daily.   Yes Historical Provider, MD  dexlansoprazole (DEXILANT) 60 MG capsule Take 60 mg by mouth daily.   Yes Historical Provider, MD  loratadine-pseudoephedrine (CLARITIN-D 12-HOUR) 5-120 MG per tablet Take 1 tablet by mouth 2 (two) times daily.   Yes Historical Provider, MD  ondansetron (ZOFRAN) 8 MG tablet TAKE 1 TABLET BY MOUTH EVERY 8 HOURS AS NEEDED FOR NAUSEA. 03/20/15  Yes Patrici Ranks, MD  traMADol (ULTRAM) 50 MG tablet TAKE (1) TABLET BY MOUTH EVERY (6) HOURS. 03/14/15  Yes Baird Cancer, PA-C  traZODone (DESYREL) 50 MG tablet Take 50 mg by mouth at bedtime.   Yes Historical Provider, MD  fexofenadine-pseudoephedrine (ALLEGRA-D) 60-120 MG per tablet Take 1 tablet by mouth every 12 (twelve) hours. 04/02/15   Evalee Jefferson, PA-C  HYDROcodone-acetaminophen (NORCO/VICODIN) 5-325 MG per tablet Take 1 tablet by mouth every 4 (four) hours as needed (for cough suppression). 04/02/15   Evalee Jefferson, PA-C  pantoprazole (PROTONIX) 40 MG tablet Take 1 tablet (40 mg total) by mouth daily. Patient not taking: Reported on 04/02/2015 02/14/15   Mahala Menghini, PA-C  peg 3350 powder (MOVIPREP) 100 G SOLR Take 1 kit (200 g total) by mouth as directed. Patient not taking: Reported on 04/02/2015 02/14/15   Daneil Dolin, MD  polyethylene glycol-electrolytes (TRILYTE) 420 G solution Take 4,000 mLs by mouth as directed. 03/12/15   Daneil Dolin, MD   BP 129/55 mmHg  Pulse 83  Temp(Src) 98.8 F (37.1 C) (Oral)  Resp 16  SpO2 99% Physical Exam  Constitutional: She is oriented to person, place, and time. She appears well-developed and well-nourished.  HENT:  Head: Normocephalic and atraumatic.  Right Ear: Tympanic membrane and ear canal normal.  Left Ear: Tympanic membrane and ear canal normal.  Nose: Mucosal  edema and rhinorrhea present.  Mouth/Throat: Uvula is midline, oropharynx is clear and moist and mucous membranes are normal. No oropharyngeal exudate, posterior oropharyngeal edema, posterior oropharyngeal erythema or tonsillar abscesses.  Eyes: Conjunctivae are normal.  Cardiovascular: Normal rate and normal heart sounds.   Pulmonary/Chest: Effort normal. No respiratory distress. She has no wheezes. She has no rales.  Abdominal: Soft. There is no tenderness.  Musculoskeletal: Normal range of motion.  Neurological: She is alert and oriented to person, place, and time.  Skin: Skin is warm and dry. No rash noted.  Psychiatric: She has a normal mood and affect.    ED Course  Procedures (including critical care time) Labs Review Labs Reviewed - No data to display  Imaging Review Dg Chest 2 View  04/02/2015  CLINICAL DATA:  Productive cough  EXAM: CHEST  2 VIEW  COMPARISON:  02/26/2015  FINDINGS: Right internal jugular Port-A-Cath is stable with its tip in the lower SVC. Clear lungs. No pneumothorax or pleural effusion. Normal heart size.  IMPRESSION: No active cardiopulmonary disease.   Electronically Signed   By: Marybelle Killings M.D.   On: 04/02/2015 11:00     EKG Interpretation None      MDM   Final diagnoses:  Viral URI with cough    Patients labs and/or radiological studies were reviewed and considered during the medical decision making and disposition process.  Results were also discussed with patient. Prior records reviewed including recent labs indicating normal wbc's on recent cbc's.  She was encouraged to finish her abx.  Added allegra D for nasal sx, hydrocodone for cough suppression and sleep, cautioned re sedation (discussed tussionex but this is not covered by Medicaid).  Prn f/u with pcp if not improving.    Evalee Jefferson, PA-C 04/03/15 2211  Milton Ferguson, MD 04/03/15 2221

## 2015-04-10 ENCOUNTER — Encounter (HOSPITAL_COMMUNITY): Payer: Medicaid Other | Attending: Hematology & Oncology

## 2015-04-10 ENCOUNTER — Encounter (HOSPITAL_COMMUNITY): Payer: Self-pay

## 2015-04-10 DIAGNOSIS — D259 Leiomyoma of uterus, unspecified: Secondary | ICD-10-CM | POA: Insufficient documentation

## 2015-04-10 DIAGNOSIS — J45909 Unspecified asthma, uncomplicated: Secondary | ICD-10-CM | POA: Insufficient documentation

## 2015-04-10 DIAGNOSIS — C541 Malignant neoplasm of endometrium: Secondary | ICD-10-CM

## 2015-04-10 DIAGNOSIS — J449 Chronic obstructive pulmonary disease, unspecified: Secondary | ICD-10-CM | POA: Insufficient documentation

## 2015-04-10 DIAGNOSIS — Z452 Encounter for adjustment and management of vascular access device: Secondary | ICD-10-CM | POA: Diagnosis present

## 2015-04-10 MED ORDER — HEPARIN SOD (PORK) LOCK FLUSH 100 UNIT/ML IV SOLN
INTRAVENOUS | Status: AC
Start: 2015-04-10 — End: 2015-04-10
  Filled 2015-04-10: qty 5

## 2015-04-10 MED ORDER — SODIUM CHLORIDE 0.9 % IJ SOLN
10.0000 mL | INTRAMUSCULAR | Status: DC | PRN
  Administered 2015-04-10: 10 mL via INTRAVENOUS
  Filled 2015-04-10: qty 10

## 2015-04-10 MED ORDER — HEPARIN SOD (PORK) LOCK FLUSH 100 UNIT/ML IV SOLN
500.0000 [IU] | Freq: Once | INTRAVENOUS | Status: AC
  Administered 2015-04-10: 500 [IU] via INTRAVENOUS
  Filled 2015-04-10: qty 5

## 2015-04-10 NOTE — Progress Notes (Signed)
..  Camelia Eng presented for Portacath access and flush.. Dual  Portacath located rt chest wall accessed with  H 20 needle.  Good blood return present on medial side. Portacath flushed with 34ml NS and 500U/22ml Heparin and needle removed intact each side.  Procedure tolerated well and without incident.

## 2015-04-17 ENCOUNTER — Ambulatory Visit: Payer: Medicaid Other | Admitting: Gastroenterology

## 2015-04-17 ENCOUNTER — Telehealth: Payer: Self-pay | Admitting: Internal Medicine

## 2015-04-17 ENCOUNTER — Encounter: Payer: Self-pay | Admitting: Gastroenterology

## 2015-04-17 NOTE — Telephone Encounter (Signed)
PATIENT WAS A NO SHOW AND LETTER WAS SENT  °

## 2015-05-13 ENCOUNTER — Telehealth (HOSPITAL_COMMUNITY): Payer: Self-pay | Admitting: *Deleted

## 2015-05-13 ENCOUNTER — Other Ambulatory Visit (HOSPITAL_COMMUNITY): Payer: Self-pay | Admitting: Oncology

## 2015-05-13 MED ORDER — TRAMADOL HCL 50 MG PO TABS: ORAL_TABLET | ORAL | Status: DC

## 2015-05-13 NOTE — Telephone Encounter (Signed)
Refill on tramadol called to [facility]

## 2015-05-13 NOTE — Telephone Encounter (Signed)
Please call in Tramadol as previously prescribed.  I cannot e-scribe this medication.  Thank you.    Maranatha Grossi, PA-C

## 2015-05-16 ENCOUNTER — Encounter: Payer: Self-pay | Admitting: Adult Health

## 2015-05-16 ENCOUNTER — Ambulatory Visit (INDEPENDENT_AMBULATORY_CARE_PROVIDER_SITE_OTHER): Payer: Medicaid Other | Admitting: Adult Health

## 2015-05-16 VITALS — BP 140/84 | HR 80 | Ht 63.0 in | Wt 155.0 lb

## 2015-05-16 DIAGNOSIS — N898 Other specified noninflammatory disorders of vagina: Secondary | ICD-10-CM | POA: Diagnosis not present

## 2015-05-16 DIAGNOSIS — IMO0002 Reserved for concepts with insufficient information to code with codable children: Secondary | ICD-10-CM

## 2015-05-16 DIAGNOSIS — Z8542 Personal history of malignant neoplasm of other parts of uterus: Secondary | ICD-10-CM

## 2015-05-16 DIAGNOSIS — N941 Dyspareunia: Secondary | ICD-10-CM | POA: Diagnosis not present

## 2015-05-16 HISTORY — DX: Reserved for concepts with insufficient information to code with codable children: IMO0002

## 2015-05-16 HISTORY — DX: Other specified noninflammatory disorders of vagina: N89.8

## 2015-05-16 NOTE — Progress Notes (Signed)
Subjective:     Patient ID: Julia Osborne, female   DOB: [birth year], 54 y.o.   MRN: [id number]  HPI Julia Osborne is a 54 year old white female, in complaining of vaginal dryness and pain with sex.She is sp TAHBSO for endometrial cancer in November 2015 by Dr Maia Petties.Has new partner and wants to have sex again.  Review of Systems Patient denies any headaches, hearing loss, fatigue, blurred vision, shortness of breath, chest pain, abdominal pain, problems with bowel movements, urination. No joint pain or mood swings.See HPI for positives. Reviewed past medical,surgical, social and family history. Reviewed medications and allergies.     Objective:   Physical Exam BP 140/84 mmHg  Pulse 80  Ht 5\' 3"  (1.6 m)  Wt 155 lb (70.308 kg)  BMI 27.46 kg/m2 Skin warm and dry.Pelvic: external genitalia is normal in appearance no lesions, vagina: is pale with decreased moisture and rugae, atrophic,urethra has no lesions or masses noted, cervix and uterus are absent.adnexa: no masses or tenderness noted. Bladder is non tender and no masses felt. Discussed that tissues have dried and thinned and need more moisture and stretching.     Assessment:    Vaginal dryness Dyspareunia History of endometrial cancer    Plan:    Use condoms Try luvena for vaginal moisture Use astro glide with sex  Try using fingers to stretch vagina or get sex toy to help stretch tissues Will check with Dr Whitney Muse to see if can use some vaginal estrogen  Follow up as scheduled for pap

## 2015-05-16 NOTE — Patient Instructions (Signed)
Dyspareunia Dyspareunia is pain during sexual intercourse. It is most common in women, but it also happens in men.  CAUSES  Female The pain from this condition is usually felt when anything is put into the vagina, but any part of the genitals may cause pain during sex. Even sitting or wearing pants can cause pain. Sometimes, a cause cannot be found. Some causes of pain during intercourse are:  Infections of the skin around the vagina.  Vaginal infections, such as a yeast, bacterial, or viral infection.  Vaginismus. This is the inability to have anything put in the vagina even when the woman wants it to happen. There is an automatic muscle contraction and pain. The pain of the muscle contraction can be so severe that intercourse is impossible.  Allergic reaction from spermicides, semen, condoms, scented tampons, soaps, douches, and vaginal sprays.  A fluid-filled sac (cyst) on the Bartholin or Skene glands, located at the opening of the vagina.  Scar tissue in the vagina from a surgically enlarged opening (episiotomy) or tearing after delivering a baby.  Vaginal dryness. This is more common in menopause. The normal secretions of the vagina are decreased. Changes in estrogen levels and increased difficulty becoming aroused can cause painful sex. Vaginal dryness can also happen when taking birth control pills.  Thinning of the tissue (atrophy) of the vulva and vagina. This makes the area thinner, smaller, unable to stretch to accommodate a penis, and prone to infection and tearing.  Vulvar vestibulitis or vestibulodynia.This is a condition that causes pain involving the area around the entrance to the vagina.The most common cause in young women is birth control pills.Women with low estrogen levels (postmenopausal women) may also experience this.Other causes include allergic reactions, too many nerve endings, skin conditions, and pelvic muscles that cannot relax.  Vulvar dermatoses. This  includes skin conditions such as lichen sclerosus and lichen planus.  Lack of foreplay to lubricate the vagina. This can cause vaginal dryness.  Noncancerous tumors (fibroids) in the uterus.  Uterus lining tissue growing outside the uterus (endometriosis).  Pregnancy that starts in the fallopian tube (tubal pregnancy).  Pregnancy or breastfeeding your baby. This can cause vaginal dryness.  A tilting or prolapse of the uterus. Prolapse is when weak and stretched muscles around the uterus allow it to fall into the vagina.  Problems with the ovaries, cysts, or scar tissue. This may be worse with certain sexual positions.  Previous surgeries causing adhesions or scar tissue in the vagina or pelvis.  Bladder and intestinal problems.  Psychological problems (such as depression or anxiety). This may make pain worse.  Negative attitudes about sex, experiencing rape, sexual assault, and misinformation about sex. These issues are often related to some types of pain.  Previous pelvic infection, causing scar tissue in the pelvis and on the female organs.  Cyst or tumor on the ovary.  Cancer of the female organs.  Certain medicines.  Medical problems such as diabetes, arthritis, or thyroid disease. Female In men, there are many physical causes of sexual discomfort. Some causes of pain during intercourse are:  Infections of the prostate, bladder, or seminal vesicles. This can cause pain after ejaculation.  An inflamed bladder (interstitial cystitis). This may cause pain from ejaculation.  Gonorrheal infections. This may cause pain during ejaculation.  An inflamed urethra (urethritis) or inflamed prostate (prostatitis). This can make genital stimulation painful or uncomfortable.  Deformities of the penis, such as Peyronie's disease.  A tight foreskin.  Cancer of the female organs.    Psychological problems. This may make pain worse. DIAGNOSIS   Your caregiver will take a history and  have you describe where the pain is located (outside the vagina, in the vagina, in the pelvis). You may be asked when you experience pain, such as with penetration or with thrusting.  Following this, your caregiver will do a physical exam. Let your caregiver know if the exam is too painful.  During the final part of the female exam, your caregiver will feel your uterus and ovaries with one hand on the abdomen and one finger in your vagina. This is a pelvic exam.  Blood tests, a Pap test, cultures for infection, an ultrasound test, and X-rays may be done. You may need to see a specialist for female problems (gynecologist).  Your caregiver may do a CT scan, MRI, or laparoscopy. Laparoscopy is a procedure to look into the pelvis with a lighted tube, through a cut (incision) in the abdomen. TREATMENT  Your caregiver can help you determine the best course of treatment. Sometimes, more testing is done. Continue with the suggested testing until your caregiver feels sure about your diagnosis and how to treat it. Sometimes, it is difficult to find the reason for the pain. The search for the cause and treatment can be frustrating. Treatment often takes several weeks to a few months before you notice any improvement. You may also need to avoid sexual activity until symptoms improve.Continuing to have sex when it hurts can delay healing and actually make the problem worse. The treatment depends on the cause of the pain. Treatment may include:  Medicines such as antibiotics, vaginal or skin creams, hormones, or antidepressants.  Minor or major surgery.  Psychological counseling or group therapy.  Kegel exercises and vaginal dilators to help certain cases of vaginismus (spasms). Do this only if recommended by your caregiver.Kegel exercises can make some problems worse.  Applying lubrication as recommended by your caregiver if you have dryness.  Sex therapy for you and your sex partner. It is common for  the pain to continue after the reason for the pain has been treated. Some reasons for this include a conditioned response. This means the person having the pain becomes so familiar with the pain that the pain continues as a response, even though the cause is removed. Sex therapy can help with this problem. HOME CARE INSTRUCTIONS   Follow your caregiver's instructions about taking medicines, tests, counseling, and follow-up treatment.  Do not use scented tampons, douches, vaginal sprays, or soaps.  Use water-based lubricants for dryness. Oil lubricants can cause irritation.  Do not use spermicides or condoms that irritate you.  Openly discuss with your partner your sexual experience, your desires, foreplay, and different sexual positions for a more comfortable and enjoyable sexual relationship.  Join group sessions for therapy, if needed.  Practice safe sex at all times.  Empty your bladder before having intercourse.  Try different positions during sexual intercourse.  Take over-the-counter pain medicine recommended by your caregiver before having sexual intercourse.  Do not wear pantyhose. Knee-high and thigh-high hose are okay.  Avoid scrubbing your vulva with a washcloth. Wash the area gently and pat dry with a towel. SEEK MEDICAL CARE IF:   You develop vaginal bleeding after sexual intercourse.  You develop a lump at the opening of your vagina, even if it is not painful.  You have abnormal vaginal discharge.  You have vaginal dryness.  You have itching or irritation of the vulva or vagina.  You   develop a rash or reaction to your medicine. SEEK IMMEDIATE MEDICAL CARE IF:   You develop severe abdominal pain during or shortly after sexual intercourse. You could have a ruptured ovarian cyst or ruptured tubal pregnancy.  You have a fever.  You have painful or bloody urination.  You have painful sexual intercourse, and you never had it before.  You pass out after having  sexual intercourse. Document Released: 09/27/2007 Document Revised: 11/30/2011 Document Reviewed: 12/08/2010 [facility] Patient Information 2015 [place], [place]. This information is not intended to replace advice given to you by your health care provider. Make sure you discuss any questions you have with your health care provider. Use condoms Try luvena Use astro glide Return as scheduled

## 2015-05-20 ENCOUNTER — Telehealth (HOSPITAL_COMMUNITY): Payer: Self-pay

## 2015-05-20 NOTE — Telephone Encounter (Signed)
Call from Derrek Monaco, NP and wants to know if any issues regarding giving Staphany any vaginal estrogen preparations as she has had endometrial cancer in the past.  Can be reached at [phone].

## 2015-05-21 NOTE — Telephone Encounter (Signed)
I don't believe there is a contraindication for that, BUT, I will need to defer to Dr. Whitney Muse as I am unfamiliar with the patient.  KEFALAS,THOMAS 05/21/2015 2:03 PM

## 2015-05-22 ENCOUNTER — Encounter (HOSPITAL_COMMUNITY): Payer: Medicaid Other | Attending: Hematology & Oncology

## 2015-05-22 ENCOUNTER — Encounter (HOSPITAL_COMMUNITY): Payer: Self-pay

## 2015-05-22 VITALS — BP 134/88 | HR 88 | Temp 98.7°F | Resp 18

## 2015-05-22 DIAGNOSIS — Z452 Encounter for adjustment and management of vascular access device: Secondary | ICD-10-CM

## 2015-05-22 DIAGNOSIS — C541 Malignant neoplasm of endometrium: Secondary | ICD-10-CM

## 2015-05-22 MED ORDER — HEPARIN SOD (PORK) LOCK FLUSH 100 UNIT/ML IV SOLN
INTRAVENOUS | Status: AC
  Filled 2015-05-22: qty 5

## 2015-05-22 MED ORDER — HEPARIN SOD (PORK) LOCK FLUSH 100 UNIT/ML IV SOLN
500.0000 [IU] | Freq: Once | INTRAVENOUS | Status: AC
  Administered 2015-05-22: 500 [IU] via INTRAVENOUS

## 2015-05-22 MED ORDER — SODIUM CHLORIDE 0.9 % IJ SOLN
10.0000 mL | INTRAMUSCULAR | Status: DC | PRN
  Administered 2015-05-22: 10 mL via INTRAVENOUS
  Filled 2015-05-22: qty 10

## 2015-05-22 NOTE — Progress Notes (Signed)
Julia Osborne presented for Portacath access and flush.  Portacath located left chest wall accessed with  H 20 needle.  Good blood return present. Portacath flushed with 37ml NS and 500U/24ml Heparin and needle removed intact.  Procedure tolerated well and without incident.    States, "The heels of my feet are burning and turning red constantly, and my fingers hurt and go numb a lot." x several months.  [facility] referral assessment completed by pt and referral made.

## 2015-05-22 NOTE — Patient Instructions (Signed)
[place] at [facility] Discharge Instructions  RECOMMENDATIONS MADE BY THE CONSULTANT AND ANY TEST RESULTS WILL BE SENT TO YOUR REFERRING PHYSICIAN.  Port flush today. STAR assessment and referral completed. Return as scheduled for office visit and lab work.  Thank you for choosing [place] [facility] to provide your oncology and hematology care.  To afford each patient quality time with our provider, please arrive at least 15 minutes before your scheduled appointment time.    You need to re-schedule your appointment should you arrive 10 or more minutes late.  We strive to give you quality time with our providers, and arriving late affects you and other patients whose appointments are after yours.  Also, if you no show three or more times for appointments you may be dismissed from the clinic at the providers discretion.     Again, thank you for choosing [facility].  Our hope is that these requests will decrease the amount of time that you wait before being seen by our physicians.       _____________________________________________________________  Should you have questions after your visit to [facility], please contact our office at [phone] between the hours of 8:30 a.m. and 4:30 p.m.  Voicemails left after 4:30 p.m. will not be returned until the following business day.  For prescription refill requests, have your pharmacy contact our office.

## 2015-06-05 ENCOUNTER — Other Ambulatory Visit: Payer: Self-pay

## 2015-06-05 ENCOUNTER — Encounter: Payer: Self-pay | Admitting: Gastroenterology

## 2015-06-05 ENCOUNTER — Ambulatory Visit (HOSPITAL_COMMUNITY): Payer: Medicaid Other | Attending: Hematology & Oncology | Admitting: Specialist

## 2015-06-05 ENCOUNTER — Encounter (HOSPITAL_COMMUNITY): Payer: Self-pay | Admitting: Specialist

## 2015-06-05 ENCOUNTER — Ambulatory Visit (INDEPENDENT_AMBULATORY_CARE_PROVIDER_SITE_OTHER): Payer: Medicaid Other | Admitting: Gastroenterology

## 2015-06-05 ENCOUNTER — Ambulatory Visit (HOSPITAL_COMMUNITY): Payer: Medicaid Other | Admitting: Physical Therapy

## 2015-06-05 VITALS — BP 107/74 | HR 81 | Temp 98.1°F | Ht 63.0 in | Wt 152.6 lb

## 2015-06-05 DIAGNOSIS — K921 Melena: Secondary | ICD-10-CM | POA: Diagnosis not present

## 2015-06-05 DIAGNOSIS — G620 Drug-induced polyneuropathy: Secondary | ICD-10-CM

## 2015-06-05 DIAGNOSIS — K2100 Gastro-esophageal reflux disease with esophagitis, without bleeding: Secondary | ICD-10-CM

## 2015-06-05 DIAGNOSIS — K625 Hemorrhage of anus and rectum: Secondary | ICD-10-CM

## 2015-06-05 DIAGNOSIS — K5900 Constipation, unspecified: Secondary | ICD-10-CM | POA: Diagnosis not present

## 2015-06-05 DIAGNOSIS — G622 Polyneuropathy due to other toxic agents: Secondary | ICD-10-CM | POA: Diagnosis present

## 2015-06-05 DIAGNOSIS — T451X5A Adverse effect of antineoplastic and immunosuppressive drugs, initial encounter: Secondary | ICD-10-CM

## 2015-06-05 DIAGNOSIS — R933 Abnormal findings on diagnostic imaging of other parts of digestive tract: Secondary | ICD-10-CM | POA: Insufficient documentation

## 2015-06-05 MED ORDER — LINACLOTIDE 145 MCG PO CAPS: 145.0000 ug | ORAL_CAPSULE | Freq: Every day | ORAL | Status: DC

## 2015-06-05 MED ORDER — PEG 3350-KCL-NA BICARB-NACL 420 G PO SOLR: 4000.0000 mL | Freq: Once | ORAL | Status: DC

## 2015-06-05 MED ORDER — PEG-KCL-NACL-NASULF-NA ASC-C 100 G PO SOLR: 1.0000 | Freq: Once | ORAL | Status: DC

## 2015-06-05 NOTE — Therapy (Signed)
[place], [place], [address] Phone: [phone]   Fax:  [phone]  Occupational Therapy Evaluation  Patient Details  Name: Julia Osborne MRN: [id number] Date of Birth: Nov 06, 1960 Referring Provider:  Patrici Ranks, MD  Encounter Date: 06/05/2015      OT End of Session - 06/05/15 1403    Visit Number 1   Number of Visits 1   Date for OT Re-Evaluation 08/04/15   Authorization Type Medicaid   Authorization - Visit Number 1   Authorization - Number of Visits 1   OT Start Time 9379   OT Stop Time 1340   OT Time Calculation (min) 35 min   Activity Tolerance Patient tolerated treatment well   Behavior During Therapy [facility] for tasks assessed/performed      Past Medical History  Diagnosis Date  . Asthma   . Uterine fibroid   . Cancer     endometrial  . COPD (chronic obstructive pulmonary disease)     no definite diagnosis  . Anemia   . Vaginal Pap smear, abnormal   . Indigestion   . Diverticulitis   . Vaginal dryness 05/16/2015  . Dyspareunia 05/16/2015    Past Surgical History  Procedure Laterality Date  . Tubal ligation    . Appendectomy    . Abdominal hysterectomy    . Portacath placement Right 09/2014  . Colonoscopy with propofol N/A 03/14/2015    RMR: Internal hemorrhoids. colonic diverticulosis. Incomplete examination. Prepartation inadequate.  . Esophagogastroduodenoscopy (egd) with propofol N/A 03/14/2015    RMR: Mild erosive reflux esophagitis status post passage o f a Maloney dilator. Abnormal gastric mucosa of uncertain significance as described above. status post biopsy, benign  . Esophageal dilation N/A 03/14/2015    Procedure: ESOPHAGEAL DILATION;  Surgeon: Daneil Dolin, MD;  Location: [facility];  Service: Endoscopy;  Laterality: N/A;  Maloney 62  . Esophageal biopsy N/A 03/14/2015    Procedure: BIOPSY;  Surgeon: Daneil Dolin, MD;  Location: [facility];  Service: Endoscopy;  Laterality: N/A;   Gastric    There were no vitals filed for this visit.  Visit Diagnosis:  Chemotherapy-induced peripheral neuropathy - Plan: Ot plan of care cert/re-cert      Subjective Assessment - 06/05/15 1354    Subjective  S:  My hands are numb and so are my feet.     Pertinent History Ms. Moor is in remission from endometrial cancer.  She stopped chemotherapy in April, and has noticed increased pain and numbness in her hands and feet since that time.  She was referred to the cancer rehab program by Dr. Whitney Muse.    Special Tests VAS Fatigue 4.5, VAS Pain 6, VAS Distress 6.  Facit F completed,     Patient Stated Goals I want to get rid of the numbness in my hands and feet.   Currently in Pain? Yes   Pain Score 5    Pain Location Foot   Pain Orientation Right;Left   Pain Descriptors / Indicators Burning   Pain Type Neuropathic pain   Pain Onset More than a month ago   Pain Frequency Intermittent   Aggravating Factors  prolonged time on feet   Pain Relieving Factors rest, orthotics   Effect of Pain on Daily Activities unable to walk and exercise as she wishes           [facility] OT Assessment - 06/05/15 0001    Assessment   Diagnosis Bilateral Hand  Numbness   Onset Date --  4 months ago   Precautions   Precautions None   Restrictions   Weight Bearing Restrictions No   Balance Screen   Has the patient fallen in the past 6 months No   Has the patient had a decrease in activity level because of a fear of falling?  No   Is the patient reluctant to leave their home because of a fear of falling?  No   Home  Environment   Lives With Significant other   Prior Function   Level of Independence Independent   Vocation Part time employment   Vocation Requirements CNA   Leisure crafting   ADL   ADL comments Able to complete most B/IADLS with several rest breaks and with modifications.  Her numbness and hand weakness makes it difficult to open containers etc   Written Expression   Dominant Hand  Right   Vision - History   Baseline Vision Wears glasses all the time   Activity Tolerance   Activity Tolerance Tolerates 10-20 min activity with muiltiple rests   Observation/Other Assessments   Outcome Measures VAS 4.5, 6, 6   Sensation   Light Touch Appears Intact   Stereognosis Appears Intact   Hot/Cold Appears Intact   Proprioception Appears Intact   Coordination   Gross Motor Movements are Fluid and Coordinated Yes   Fine Motor Movements are Fluid and Coordinated Yes   AROM   Overall AROM Comments BUE AROM and strength are WNL for ADL completion   Hand Function   Right Hand Grip (lbs) 60   Left Hand Grip (lbs) 62                         OT Education - 06/05/15 1401    Education provided Yes   Education Details Issued Cancer Survivor guide book, pedometer, energy conservation technique handout and hand out on Chemotherapy Induced Peripheral Neuropathy.  Also educated patient on bilateral grip strengthening with mod resist green theraputty.    Person(s) Educated Patient   Methods Explanation;Demonstration;Handout   Comprehension Verbalized understanding;Returned demonstration          OT Short Term Goals - 06/05/15 1408    OT SHORT TERM GOAL #1   Title Patient will be educated on a HEP.   Time 1   Period Days   Status Achieved                  Plan - 06/05/15 1403    Clinical Impression Statement A:  Patient is a 54 year old female in remission since April from endometrial cancer.  Patient is experiencing increased weakness and numbness in her hands and burning in her feet.  OTR/L educated patient on chemotherapy induced peripheral neuropathy, energy conservation techniques, educational materials in the cancer survivor guidebook, and a pedometer to begin to slowly improve her activity tolerance.  Recommended swimming and yoga classes at the [facility] as great forms of exercise/relaxation.  Patient will not need continued skilled OT intervention,  however, encouraged patient to contact this therapist if any additional questions arise.   Rehab Potential Good   OT Frequency 1x / week   OT Duration --  1 week   OT Treatment/Interventions Patient/family education   Plan P:  DC from skilled OT intervention this date, patient to continue with HEP.   Consulted and Agree with Plan of Care Patient        Problem List Patient  Active Problem List   Diagnosis Date Noted  . Constipation 06/05/2015  . Abnormal CT scan, sigmoid colon 06/05/2015  . Vaginal dryness 05/16/2015  . Dyspareunia 05/16/2015  . Mucosal abnormality of stomach   . Reflux esophagitis   . Dysphagia, pharyngoesophageal phase   . Hematochezia   . Diverticulosis of colon without hemorrhage   . Rectal bleeding 02/14/2015  . GERD (gastroesophageal reflux disease) 02/14/2015  . Esophageal dysphagia 02/14/2015  . Abdominal pain, epigastric 02/14/2015  . Nausea with vomiting 02/14/2015  . Endometrial cancer 10/09/2014  . Conversion reaction   . Depression   . Aphasia 09/11/2014    Vangie Bicker, OTR/L [phone]  06/05/2015, 2:11 PM  [place] [facility] [address] [place], [place], [address] Phone: [phone]   Fax:  [phone]

## 2015-06-05 NOTE — Assessment & Plan Note (Signed)
Doing better on Dexilant 60mg  daily. Avoiding certain foods. Dysphagia improved s/p dilation. Continue PPI and antireflux measures.

## 2015-06-05 NOTE — Patient Instructions (Signed)
Energy Conservation Techniques  We all get worn out sometimes. But fatigue secondary to disability or aging can really interfere with the ability to function independently. If you find that fatigue is keeping you from doing things you want to do in your life, energy conservation techniques may help you. Energy conservation means looking at your daily routines to find ways to reduce the amount of effort needed to perform certain tasks, eliminating other tasks, and building more rest throughout the day. Keep in mind that not every technique will work for you. These are suggestions you can use and adapt to find the right fit for you. Remember: Energy is like money - you've only got so much, so think about what you're spending it on! Rearrange Your Environment ! Keep frequently used items in easily accessible places. ! Replace existing heavy items with lighter ones; for example, use plastic plates and cups rather than [place] and glass. ! Install long handles on faucets and doorknobs. ! Adjust work spaces, such as raising a tabletop, to eliminate awkward positions; bad posture drains energy. ! Install pull-out or swing-out shelving in cabinets. ! Wear an apron with pockets to carry around cooking utensils or cleaning tools. ! Consider moving your bed to the first floor to eliminate stair climbing. Eliminate Unnecessary Effort ! Sit rather than stand whenever possible: while preparing meals, washing dishes, ironing, etc. ! Use adaptive equipment to make tasks easier; try a jar opener, a reacher, a shower chair to allow you to sit while bathing, or a hands-free headset for your phone. ! Soak your dishes before washing, then let them air dry; or use paper plates and napkins. ! Use prepared foods when possible. ! Get a rolling cart to transport things around the house, rather than carry them. ! See if your grocery store will deliver your groceries. ! Use store-provided wheelchairs or scooters when you  shop. Plan Ahead ! Gather all the supplies you need for a task or project before starting, so everything is in one place. ! Call ahead to stores to make sure the items you need are available. ! Cook in larger quantities and refrigerate or freeze extra portions for later. ! Work rest breaks into activities as often as possible. Take a break before you get tired. ! Schedule enough time for activities - rushing takes more energy. ! Try keeping a daily activity journal for a few weeks to identify times of day or certain tasks that result in more fatigue. Prioritize ! Eliminate or reduce tasks that aren't that important to you. ! Delegate tasks to friends or family members who offer help. ! Consider hiring professionals, such as a cleaning or lawn care service, to cut down your workload. Adapted from: http://www.InvestmentInstructor.be.html and http://www.otworks.ca/otworks_page.asp?pageID=689

## 2015-06-05 NOTE — Assessment & Plan Note (Signed)
Intermittent rectal bleeding, mild sigmoid colon wall thickening on CT several months ago. Recent attempted colonoscopy incomplete due to inadequate bowel prep. Patient was unable to complete bowel prep due to vomiting. Plan on colonoscopy with deep sedation in the near future. Previously could not tolerate TriLytely, insurance should cover Moviprep according to pharmacy. Plan on split Moviprep.  I have discussed the risks, alternatives, benefits with regards to but not limited to the risk of reaction to medication, bleeding, infection, perforation and the patient is agreeable to proceed. Written consent to be obtained.

## 2015-06-05 NOTE — Patient Instructions (Signed)
1. Start Linzess 170mcg once daily before breakfast for constipation. Samples provided and RX sent to your pharmacy. Take on an empty stomach. You may notice loose stools at first. You can skip a day if you are having multiple stools within 24 hour period of time.   2. Colonoscopy as scheduled. If you have trouble taking the prep you need to call the doctor on call if after hours ([phone]) or call the office [phone]) during business hours.  3. Make sure you take Linzess daily for one week prior to your colonoscopy to help with your bowel prep.

## 2015-06-05 NOTE — Progress Notes (Signed)
Primary Care Physician:  [place]  Primary Gastroenterologist:  Garfield Cornea, MD   Chief Complaint  Patient presents with  . Follow-up  . reschedule her procedure    HPI:  Julia Osborne is a 54 y.o. female here for follow-up to reschedule colonoscopy. Recently underwent attempted colonoscopy for rectal bleeding and abnormal sigmoid colon noted on CT scan. Incomplete examination due to inadequate preparation. She did undergo an EGD with Maloney dilation at that time. She had mild erosive reflux esophagitis and an upper esophageal stricture, abnormal gastric mucosa with benign biopsies. She was switched to Dexilant at that time.   She was diagnosed stage IV endometrial cancer, probable pulmonary metastases back in October 2015. Underwent radical hysterectomy, bilateral salpingo-oophorectomy, pelvic lymph node dissection and vaginal biopsies in November 2015. She had extensive pelvic disease including positive lymph nodes and positive distal vaginal biopsy. She has completed chemotherapy. Her last scans as outlined below. She had mild sigmoid colon wall thickening and diverticulosis without surrounding inflammatory change. Describes having a colonoscopy 10-15 years ago at [facility], cannot remember the results.  Patient states that she's doing better from upper GI standpoint on new PPI. She is also trying avoid spicy foods which tend to make her reflux worse. Weight is fairly stable. She notes bowel movements occur 1-2 times daily although [place] stool 2. With recent attempted colonoscopy, she states she drank half of a gallon of TriLyte daily but then had vomiting and was unable to complete it. She has issues with chronic nausea since chemotherapy and stays on Zofran as needed. Denies any recent rectal bleeding, no melena. No dysphagia.   Current Outpatient Prescriptions  Medication Sig Dispense Refill  . albuterol (PROVENTIL HFA;VENTOLIN HFA) 108 (90 BASE) MCG/ACT  inhaler Inhale 2 puffs into the lungs every 2 (two) hours as needed for wheezing or shortness of breath (cough). (Patient taking differently: Inhale 1 puff into the lungs every 6 (six) hours as needed for wheezing or shortness of breath (cough). ) 1 Inhaler 0  . ALPRAZolam (XANAX) 0.25 MG tablet Take 0.25 mg by mouth 3 (three) times daily as needed for anxiety.     . citalopram (CELEXA) 20 MG tablet Take 20 mg by mouth daily.    Marland Kitchen dexlansoprazole (DEXILANT) 60 MG capsule Take 60 mg by mouth daily.    . fexofenadine-pseudoephedrine (ALLEGRA-D) 60-120 MG per tablet Take 1 tablet by mouth every 12 (twelve) hours. 30 tablet 0  . ondansetron (ZOFRAN) 8 MG tablet TAKE 1 TABLET BY MOUTH EVERY 8 HOURS AS NEEDED FOR NAUSEA. 30 tablet 2  . traMADol (ULTRAM) 50 MG tablet TAKE (1) TABLET BY MOUTH EVERY (6) HOURS. (Patient taking differently: Prn) 30 tablet 1  . traZODone (DESYREL) 50 MG tablet Take 50 mg by mouth at bedtime.     No current facility-administered medications for this visit.    Allergies as of 06/05/2015 - Review Complete 06/05/2015  Allergen Reaction Noted  . Codeine Rash 08/25/2011    Past Medical History  Diagnosis Date  . Asthma   . Uterine fibroid   . Cancer     endometrial  . COPD (chronic obstructive pulmonary disease)     no definite diagnosis  . Anemia   . Vaginal Pap smear, abnormal   . Indigestion   . Diverticulitis   . Vaginal dryness 05/16/2015  . Dyspareunia 05/16/2015    Past Surgical History  Procedure Laterality Date  . Tubal ligation    . Appendectomy    .  Abdominal hysterectomy    . Portacath placement Right 09/2014  . Colonoscopy with propofol N/A 03/14/2015    RMR: Internal hemorrhoids. colonic diverticulosis. Incomplete examination. Prepartation inadequate.  . Esophagogastroduodenoscopy (egd) with propofol N/A 03/14/2015    RMR: Mild erosive reflux esophagitis status post passage o f a Maloney dilator. Abnormal gastric mucosa of uncertain significance as  described above. status post biopsy.   . Esophageal dilation N/A 03/14/2015    Procedure: ESOPHAGEAL DILATION;  Surgeon: Daneil Dolin, MD;  Location: [facility];  Service: Endoscopy;  Laterality: N/A;  Maloney 24  . Esophageal biopsy N/A 03/14/2015    Procedure: BIOPSY;  Surgeon: Daneil Dolin, MD;  Location: [facility];  Service: Endoscopy;  Laterality: N/A;  Gastric    Family History  Problem Relation Age of Onset  . Other Mother     clot that went to heart, deceased age 40ss  . Heart attack Father     age 67s, deceased  . Stroke Father   . Cancer Sister     kidney  . Hypertension Sister   . Diabetes Brother   . Hypertension Brother   . Endometriosis Daughter   . Heart attack Maternal Grandfather   . Diabetes Sister   . Colon cancer Neg Hx     Social History   Social History  . Marital Status: Single    Spouse Name: N/A  . Number of Children: 2  . Years of Education: N/A   Occupational History  . Not on file.   Social History Main Topics  . Smoking status: Current Every Day Smoker -- 0.50 packs/day for 25 years    Types: Cigarettes  . Smokeless tobacco: Never Used     Comment: smokes 1 pack per week  . Alcohol Use: Yes     Comment: once a year  . Drug Use: No  . Sexual Activity: Yes    Birth Control/ Protection: Surgical     Comment: hyst   Other Topics Concern  . Not on file   Social History Narrative      ROS:  General: Negative for anorexia, weight loss, fever, chills, fatigue, weakness. Eyes: Negative for vision changes.  ENT: Negative for hoarseness, difficulty swallowing , nasal congestion. CV: Negative for chest pain, angina, palpitations, dyspnea on exertion, peripheral edema.  Respiratory: Negative for dyspnea at rest, dyspnea on exertion, cough, sputum, wheezing.  GI: See history of present illness. GU:  Negative for dysuria, hematuria, urinary incontinence, urinary frequency, nocturnal urination.  MS: Negative for joint pain, low back pain.   Derm: Negative for rash or itching.  Neuro: Negative for weakness, abnormal sensation, seizure, frequent headaches, memory loss, confusion.  Psych: Negative for anxiety, depression, suicidal ideation, hallucinations.  Endo: Negative for unusual weight change.  Heme: Negative for bruising or bleeding. Allergy: Negative for rash or hives.    Physical Examination:  BP 107/74 mmHg  Pulse 81  Temp(Src) 98.1 F (36.7 C)  Ht 5\' 3"  (1.6 m)  Wt 152 lb 9.6 oz (69.219 kg)  BMI 27.04 kg/m2   General: Well-nourished, well-developed in no acute distress.  Head: Normocephalic, atraumatic.   Eyes: Conjunctiva pink, no icterus. Mouth: Oropharyngeal mucosa moist and pink , no lesions erythema or exudate. Neck: Supple without thyromegaly, masses, or lymphadenopathy.  Lungs: Clear to auscultation bilaterally.  Heart: Regular rate and rhythm, no murmurs rubs or gallops.  Abdomen: Bowel sounds are normal, nontender, nondistended, no hepatosplenomegaly or masses, no abdominal bruits or    hernia ,  no rebound or guarding.   Rectal: *not performed Extremities: No lower extremity edema. No clubbing or deformities.  Neuro: Alert and oriented x 4 , grossly normal neurologically.  Skin: Warm and dry, no rash or jaundice.   Psych: Alert and cooperative, normal mood and affect.  Labs: Lab Results  Component Value Date   WBC 6.0 02/08/2015   HGB 11.8* 02/08/2015   HCT 35.0* 02/08/2015   MCV 99.4 02/08/2015   PLT 260 02/08/2015   Lab Results  Component Value Date   CREATININE 0.71 02/08/2015   BUN 13 02/08/2015   NA 140 02/08/2015   K 4.3 02/08/2015   CL 105 02/08/2015   CO2 27 02/08/2015   Lab Results  Component Value Date   ALT 39 02/08/2015   AST 24 02/08/2015   ALKPHOS 76 02/08/2015   BILITOT 0.6 02/08/2015     Imaging Studies:   CLINICAL DATA: Restaging endometrial carcinoma status post 3 cycles of chemotherapy, last administered 3 weeks ago. Initial diagnosis 07/30/2014 with  subsequent hysterectomy. Subsequent encounter.  EXAM: CT CHEST, ABDOMEN, AND PELVIS WITH CONTRAST  TECHNIQUE: Multidetector CT imaging of the chest, abdomen and pelvis was performed following the standard protocol during bolus administration of intravenous contrast.  CONTRAST: 146mL OMNIPAQUE IOHEXOL 300 MG/ML SOLN  COMPARISON: Abdominal pelvic CT 08/31/2007. Pelvic ultrasound 02/15/2014.  FINDINGS: CT CHEST FINDINGS  Mediastinum/Nodes: There are no enlarged mediastinal, hilar or axillary lymph nodes. The thyroid gland, trachea and esophagus demonstrate no significant findings. The heart size is normal. There is no pericardial effusion.Right IJ Port-A-Cath tip is in the upper right atrium. There are no significant vascular findings.  Lungs/Pleura: There is no pleural effusion.5 mm subpleural nodule anteriorly in the right middle lobe on image number 3 T8 was not clearly imaged on the prior abdominal CT and is probably a lymph node. No suspicious pulmonary nodules or confluent airspace opacities are demonstrated.  Musculoskeletal/Chest wall: No evidence of chest wall mass or suspicious osseous finding.  CT ABDOMEN AND PELVIS FINDINGS  Hepatobiliary: The liver is normal in density without focal abnormality. No evidence of gallstones, gallbladder wall thickening or biliary dilatation.  Pancreas: Unremarkable. No pancreatic ductal dilatation or surrounding inflammatory changes.  Spleen: Normal in size without focal abnormality.  Adrenals/Urinary Tract: Both adrenal glands appear normal.The kidneys appear normal without evidence of urinary tract calculus, suspicious lesion or hydronephrosis. No bladder abnormalities are seen.  Stomach/Bowel: There is mild sigmoid colon wall thickening and diverticulosis without surrounding inflammatory change. There is mildly prominent stool throughout the colon. No small bowel wall thickening, bowel distension or  extraluminal fluid collection demonstrated.  Vascular/Lymphatic: 7 mm left periaortic node on image 72 and 8 mm left common iliac node on image number 80 are unchanged from 2008, consistent with a benign etiology. No new or enlarging abdominal pelvic lymph nodes identified. Mild aortoiliac atherosclerosis noted.  Reproductive: Status post interval total hysterectomy. No evidence of adnexal mass, pelvic inflammatory process or fluid collection.  Other: Postsurgical changes within the infraumbilical anterior abdominal wall. No evidence of abdominal wall mass or hernia.  Musculoskeletal: No acute or significant osseous findings. Degenerative disc disease noted at L5-S1.  IMPRESSION: 1. Status post total hysterectomy. No evidence of residual pelvic mass. 2. Stable mildly prominent retroperitoneal lymph nodes from 2008. No evidence of metastatic disease. 3. 5 mm subpleural right middle lobe nodule, likely a benign incidental finding. Clinic notes indicate outside imaging demonstrating pulmonary nodules suspicious for metastatic disease. Direct comparison with outside imaging and/or continued  follow up suggested. 4. Mild sigmoid diverticulosis.   Electronically Signed  By: Richardean Sale M.D.  On: 11/20/2014 09:41

## 2015-06-05 NOTE — Assessment & Plan Note (Signed)
Begin Linzess 175mcg daily especially prior to bowel prep. Written and verbal instructions provided. See patient instructions.

## 2015-06-06 NOTE — Progress Notes (Signed)
CC'ED TO PCP 

## 2015-06-10 ENCOUNTER — Telehealth: Payer: Self-pay | Admitting: Adult Health

## 2015-06-10 NOTE — Telephone Encounter (Signed)
Pt has not had sex, has been sex with bronchitis, I have talked with Dr Whitney Muse andpt can use estrogen vaginal cream 2-3 x weekly if needed, pt will let me know if she wants to try any.

## 2015-06-15 ENCOUNTER — Other Ambulatory Visit (HOSPITAL_COMMUNITY): Payer: Self-pay | Admitting: Hematology & Oncology

## 2015-06-21 ENCOUNTER — Emergency Department (HOSPITAL_COMMUNITY): Payer: Medicaid Other

## 2015-06-21 ENCOUNTER — Emergency Department (HOSPITAL_COMMUNITY)
Admission: EM | Admit: 2015-06-21 | Discharge: 2015-06-21 | Disposition: A | Discharge status: Home / Self Care | Payer: Medicaid Other | Attending: Emergency Medicine | Admitting: Emergency Medicine

## 2015-06-21 ENCOUNTER — Encounter (HOSPITAL_COMMUNITY): Payer: Self-pay | Admitting: Emergency Medicine

## 2015-06-21 DIAGNOSIS — Z86018 Personal history of other benign neoplasm: Secondary | ICD-10-CM | POA: Insufficient documentation

## 2015-06-21 DIAGNOSIS — Z720 Tobacco use: Secondary | ICD-10-CM | POA: Insufficient documentation

## 2015-06-21 DIAGNOSIS — Z8742 Personal history of other diseases of the female genital tract: Secondary | ICD-10-CM | POA: Diagnosis not present

## 2015-06-21 DIAGNOSIS — Z8542 Personal history of malignant neoplasm of other parts of uterus: Secondary | ICD-10-CM | POA: Insufficient documentation

## 2015-06-21 DIAGNOSIS — Z8719 Personal history of other diseases of the digestive system: Secondary | ICD-10-CM | POA: Diagnosis not present

## 2015-06-21 DIAGNOSIS — Z79899 Other long term (current) drug therapy: Secondary | ICD-10-CM | POA: Diagnosis not present

## 2015-06-21 DIAGNOSIS — J40 Bronchitis, not specified as acute or chronic: Secondary | ICD-10-CM

## 2015-06-21 DIAGNOSIS — J209 Acute bronchitis, unspecified: Secondary | ICD-10-CM | POA: Insufficient documentation

## 2015-06-21 DIAGNOSIS — R05 Cough: Secondary | ICD-10-CM | POA: Diagnosis present

## 2015-06-21 DIAGNOSIS — J441 Chronic obstructive pulmonary disease with (acute) exacerbation: Secondary | ICD-10-CM | POA: Insufficient documentation

## 2015-06-21 DIAGNOSIS — Z862 Personal history of diseases of the blood and blood-forming organs and certain disorders involving the immune mechanism: Secondary | ICD-10-CM | POA: Diagnosis not present

## 2015-06-21 MED ORDER — IPRATROPIUM-ALBUTEROL 0.5-2.5 (3) MG/3ML IN SOLN
3.0000 mL | Freq: Once | RESPIRATORY_TRACT | Status: AC
  Administered 2015-06-21: 3 mL via RESPIRATORY_TRACT
  Filled 2015-06-21: qty 3

## 2015-06-21 MED ORDER — ALBUTEROL SULFATE (2.5 MG/3ML) 0.083% IN NEBU
2.5000 mg | INHALATION_SOLUTION | Freq: Once | RESPIRATORY_TRACT | Status: AC
  Administered 2015-06-21: 2.5 mg via RESPIRATORY_TRACT
  Filled 2015-06-21: qty 3

## 2015-06-21 MED ORDER — AZITHROMYCIN 250 MG PO TABS: ORAL_TABLET | ORAL | Status: DC

## 2015-06-21 NOTE — ED Notes (Signed)
D/C papers and prescription given and reviewed. Patient verbalized understanding.

## 2015-06-21 NOTE — Discharge Instructions (Signed)
Follow up next week if not improving. °

## 2015-06-21 NOTE — ED Notes (Signed)
RT called

## 2015-06-21 NOTE — ED Notes (Signed)
Pt reports cough, body aches and weakness. Onset of symptoms 2 days ago.

## 2015-06-24 NOTE — ED Provider Notes (Signed)
CSN: [id number]     Arrival date & time 06/21/15  1119 History   First MD Initiated Contact with Patient 06/21/15 1229     Chief Complaint  Patient presents with  . Cough     (Consider location/radiation/quality/duration/timing/severity/associated sxs/prior Treatment) Patient is a 54 y.o. female presenting with cough. The history is provided by the patient (Patient complains of cough no shortness of breath sputum production.).  Cough Cough characteristics:  Productive Severity:  Mild Onset quality:  Sudden Timing:  Constant Progression:  Waxing and waning Chronicity:  New Context: not animal exposure   Associated symptoms: no chest pain, no eye discharge, no headaches and no rash     Past Medical History  Diagnosis Date  . Asthma   . Uterine fibroid   . Cancer     endometrial  . COPD (chronic obstructive pulmonary disease)     no definite diagnosis  . Anemia   . Vaginal Pap smear, abnormal   . Indigestion   . Diverticulitis   . Vaginal dryness 05/16/2015  . Dyspareunia 05/16/2015   Past Surgical History  Procedure Laterality Date  . Tubal ligation    . Appendectomy    . Abdominal hysterectomy    . Portacath placement Right 09/2014  . Colonoscopy with propofol N/A 03/14/2015    RMR: Internal hemorrhoids. colonic diverticulosis. Incomplete examination. Prepartation inadequate.  . Esophagogastroduodenoscopy (egd) with propofol N/A 03/14/2015    RMR: Mild erosive reflux esophagitis status post passage o f a Maloney dilator. Abnormal gastric mucosa of uncertain significance as described above. status post biopsy, benign  . Esophageal dilation N/A 03/14/2015    Procedure: ESOPHAGEAL DILATION;  Surgeon: Daneil Dolin, MD;  Location: [facility];  Service: Endoscopy;  Laterality: N/A;  Maloney 35  . Esophageal biopsy N/A 03/14/2015    Procedure: BIOPSY;  Surgeon: Daneil Dolin, MD;  Location: [facility];  Service: Endoscopy;  Laterality: N/A;  Gastric   Family History  Problem Relation  Age of Onset  . Other Mother     clot that went to heart, deceased age 16ss  . Heart attack Father     age 71s, deceased  . Stroke Father   . Cancer Sister     kidney  . Hypertension Sister   . Diabetes Brother   . Hypertension Brother   . Endometriosis Daughter   . Heart attack Maternal Grandfather   . Diabetes Sister   . Colon cancer Neg Hx    Social History  Substance Use Topics  . Smoking status: Current Every Day Smoker -- 1.00 packs/day for 25 years    Types: Cigarettes  . Smokeless tobacco: Never Used     Comment: smokes 1 pack per week  . Alcohol Use: Yes     Comment: once a year   OB History    Gravida Para Term Preterm AB TAB SAB Ectopic Multiple Living   2 2        2      Review of Systems  Constitutional: Negative for appetite change and fatigue.  HENT: Negative for congestion, ear discharge and sinus pressure.   Eyes: Negative for discharge.  Respiratory: Positive for cough.   Cardiovascular: Negative for chest pain.  Gastrointestinal: Negative for abdominal pain and diarrhea.  Genitourinary: Negative for frequency and hematuria.  Musculoskeletal: Negative for back pain.  Skin: Negative for rash.  Neurological: Negative for seizures and headaches.  Psychiatric/Behavioral: Negative for hallucinations.      Allergies  Codeine  Home Medications  Prior to Admission medications   Medication Sig Start Date End Date Taking? Authorizing Provider  albuterol (PROVENTIL HFA;VENTOLIN HFA) 108 (90 BASE) MCG/ACT inhaler Inhale 2 puffs into the lungs every 2 (two) hours as needed for wheezing or shortness of breath (cough). Patient taking differently: Inhale 1 puff into the lungs every 6 (six) hours as needed for wheezing or shortness of breath (cough).  11/19/12  Yes Riki Altes, MD  ALPRAZolam Duanne Moron) 0.25 MG tablet Take 0.25 mg by mouth 3 (three) times daily as needed for anxiety.    Yes Historical Provider, MD  citalopram (CELEXA) 20 MG tablet Take 20 mg by  mouth daily.   Yes Historical Provider, MD  dexlansoprazole (DEXILANT) 60 MG capsule Take 60 mg by mouth daily.   Yes Historical Provider, MD  fexofenadine-pseudoephedrine (ALLEGRA-D) 60-120 MG per tablet Take 1 tablet by mouth every 12 (twelve) hours. 04/02/15  Yes Evalee Jefferson, PA-C  Linaclotide [facility]) 145 MCG CAPS capsule Take 1 capsule (145 mcg total) by mouth daily before breakfast. 06/05/15  Yes Mahala Menghini, PA-C  omeprazole (PRILOSEC) 20 MG capsule Take 20 mg by mouth daily.   Yes Historical Provider, MD  ondansetron (ZOFRAN) 8 MG tablet TAKE 1 TABLET BY MOUTH EVERY 8 HOURS AS NEEDED FOR NAUSEA. 06/18/15  Yes Patrici Ranks, MD  traMADol (ULTRAM) 50 MG tablet TAKE (1) TABLET BY MOUTH EVERY (6) HOURS. Patient taking differently: Prn 05/13/15  Yes Baird Cancer, PA-C  traZODone (DESYREL) 50 MG tablet Take 50 mg by mouth at bedtime.   Yes Historical Provider, MD  azithromycin (ZITHROMAX Z-PAK) 250 MG tablet 2 po day one, then 1 daily x 4 days 06/21/15   Milton Ferguson, MD   BP 113/70 mmHg  Pulse 75  Temp(Src) 98 F (36.7 C) (Oral)  Resp 16  Ht 5\' 3"  (1.6 m)  Wt 153 lb (69.4 kg)  BMI 27.11 kg/m2  SpO2 100% Physical Exam  Constitutional: She is oriented to person, place, and time. She appears well-developed.  HENT:  Head: Normocephalic.  Eyes: Conjunctivae and EOM are normal. No scleral icterus.  Neck: Neck supple. No thyromegaly present.  Cardiovascular: Normal rate and regular rhythm.  Exam reveals no gallop and no friction rub.   No murmur heard. Pulmonary/Chest: No stridor. She has no wheezes. She has no rales. She exhibits no tenderness.  Abdominal: She exhibits no distension. There is no tenderness. There is no rebound.  Musculoskeletal: Normal range of motion. She exhibits no edema.  Lymphadenopathy:    She has no cervical adenopathy.  Neurological: She is oriented to person, place, and time. She exhibits normal muscle tone. Coordination normal.  Skin: No rash noted. No  erythema.  Psychiatric: She has a normal mood and affect. Her behavior is normal.    ED Course  Procedures (including critical care time) Labs Review Labs Reviewed - No data to display  Imaging Review No results found. I have personally reviewed and evaluated these images and lab results as part of my medical decision-making.   EKG Interpretation None      MDM   Final diagnoses:  Bronchitis    Patient to be treated with Zithromax for bronchitis she'll follow-up with her M.D. when necessary    Milton Ferguson, MD 06/24/15 1310

## 2015-06-26 ENCOUNTER — Other Ambulatory Visit (HOSPITAL_COMMUNITY): Payer: Self-pay

## 2015-06-26 ENCOUNTER — Other Ambulatory Visit (HOSPITAL_COMMUNITY): Payer: Self-pay | Admitting: Oncology

## 2015-06-27 NOTE — Patient Instructions (Signed)
Julia Osborne  06/27/2015     @PREFPERIOPPHARMACY @   Your procedure is scheduled on  07/04/2015   Report to [facility] at  945  A.M.  Call this number if you have problems the morning of surgery:  [phone]   Remember:  Do not eat food or drink liquids after midnight.  Take these medicines the morning of surgery with A SIP OF WATER  Xanax, celexa, dexilant, prilosec, zofran, ultram.   Do not wear jewelry, make-up or nail polish.  Do not wear lotions, powders, or perfumes.  You may wear deodorant.  Do not shave 48 hours prior to surgery.  Men may shave face and neck.  Do not bring valuables to the hospital.  [facility] is not responsible for any belongings or valuables.  Contacts, dentures or bridgework may not be worn into surgery.  Leave your suitcase in the car.  After surgery it may be brought to your room.  For patients admitted to the hospital, discharge time will be determined by your treatment team.  Patients discharged the day of surgery will not be allowed to drive home.   Name and phone number of your driver:   Family Special instructions:  Follow the prop and diet instructions given to you by Dr Roseanne Kaufman office.  Please read over the following fact sheets that you were given. Pain Booklet, Coughing and Deep Breathing, Surgical Site Infection Prevention, Anesthesia Post-op Instructions and Care and Recovery After Surgery      Colonoscopy A colonoscopy is an exam to look at the entire large intestine (colon). This exam can help find problems such as tumors, polyps, inflammation, and areas of bleeding. The exam takes about 1 hour.  LET [facility] CARE PROVIDER KNOW ABOUT:   Any allergies you have.  All medicines you are taking, including vitamins, herbs, eye drops, creams, and over-the-counter medicines.  Previous problems you or members of your family have had with the use of anesthetics.  Any blood disorders you have.  Previous surgeries you  have had.  Medical conditions you have. RISKS AND COMPLICATIONS  Generally, this is a safe procedure. However, as with any procedure, complications can occur. Possible complications include:  Bleeding.  Tearing or rupture of the colon wall.  Reaction to medicines given during the exam.  Infection (rare). BEFORE THE PROCEDURE   Ask your health care provider about changing or stopping your regular medicines.  You may be prescribed an oral bowel prep. This involves drinking a large amount of medicated liquid, starting the day before your procedure. The liquid will cause you to have multiple loose stools until your stool is almost clear or light green. This cleans out your colon in preparation for the procedure.  Do not eat or drink anything else once you have started the bowel prep, unless your health care provider tells you it is safe to do so.  Arrange for someone to drive you home after the procedure. PROCEDURE   You will be given medicine to help you relax (sedative).  You will lie on your side with your knees bent.  A long, flexible tube with a light and camera on the end (colonoscope) will be inserted through the rectum and into the colon. The camera sends video back to a computer screen as it moves through the colon. The colonoscope also releases carbon dioxide gas to inflate the colon. This helps your health care provider see the area better.  During the exam,  your health care provider may take a small tissue sample (biopsy) to be examined under a microscope if any abnormalities are found.  The exam is finished when the entire colon has been viewed. AFTER THE PROCEDURE   Do not drive for 24 hours after the exam.  You may have a small amount of blood in your stool.  You may pass moderate amounts of gas and have mild abdominal cramping or bloating. This is caused by the gas used to inflate your colon during the exam.  Ask when your test results will be ready and how you  will get your results. Make sure you get your test results.   This information is not intended to replace advice given to you by your health care provider. Make sure you discuss any questions you have with your health care provider.   Document Released: 09/04/2000 Document Revised: 06/28/2013 Document Reviewed: 05/15/2013 Elsevier Interactive Patient Education 2016 [employer] PATIENT INSTRUCTIONS POST-ANESTHESIA  IMMEDIATELY FOLLOWING SURGERY:  Do not drive or operate machinery for the first twenty four hours after surgery.  Do not make any important decisions for twenty four hours after surgery or while taking narcotic pain medications or sedatives.  If you develop intractable nausea and vomiting or a severe headache please notify your doctor immediately.  FOLLOW-UP:  Please make an appointment with your surgeon as instructed. You do not need to follow up with anesthesia unless specifically instructed to do so.  WOUND CARE INSTRUCTIONS (if applicable):  Keep a dry clean dressing on the anesthesia/puncture wound site if there is drainage.  Once the wound has quit draining you may leave it open to air.  Generally you should leave the bandage intact for twenty four hours unless there is drainage.  If the epidural site drains for more than 36-48 hours please call the anesthesia department.  QUESTIONS?:  Please feel free to call your physician or the hospital operator if you have any questions, and they will be happy to assist you.

## 2015-06-28 ENCOUNTER — Encounter (HOSPITAL_COMMUNITY): Payer: Medicaid Other | Attending: Hematology & Oncology

## 2015-06-28 ENCOUNTER — Encounter (HOSPITAL_COMMUNITY): Payer: Medicaid Other | Attending: Hematology & Oncology | Admitting: Hematology & Oncology

## 2015-06-28 VITALS — BP 148/81 | HR 68 | Temp 98.1°F | Resp 18 | Wt 151.4 lb

## 2015-06-28 DIAGNOSIS — Z720 Tobacco use: Secondary | ICD-10-CM | POA: Diagnosis not present

## 2015-06-28 DIAGNOSIS — R110 Nausea: Secondary | ICD-10-CM

## 2015-06-28 DIAGNOSIS — G622 Polyneuropathy due to other toxic agents: Secondary | ICD-10-CM | POA: Diagnosis not present

## 2015-06-28 DIAGNOSIS — C541 Malignant neoplasm of endometrium: Secondary | ICD-10-CM | POA: Insufficient documentation

## 2015-06-28 DIAGNOSIS — Z23 Encounter for immunization: Secondary | ICD-10-CM | POA: Diagnosis not present

## 2015-06-28 DIAGNOSIS — T451X5A Adverse effect of antineoplastic and immunosuppressive drugs, initial encounter: Secondary | ICD-10-CM | POA: Diagnosis not present

## 2015-06-28 DIAGNOSIS — G620 Drug-induced polyneuropathy: Secondary | ICD-10-CM | POA: Insufficient documentation

## 2015-06-28 LAB — CBC WITH DIFFERENTIAL/PLATELET
Basophils Absolute: 0 10*3/uL (ref 0.0–0.1)
Basophils Relative: 0 %
EOS ABS: 0.1 10*3/uL (ref 0.0–0.7)
EOS PCT: 1 %
HCT: 36.8 % (ref 36.0–46.0)
Hemoglobin: 12.7 g/dL (ref 12.0–15.0)
LYMPHS ABS: 2.7 10*3/uL (ref 0.7–4.0)
LYMPHS PCT: 40 %
MCH: 31.5 pg (ref 26.0–34.0)
MCHC: 34.5 g/dL (ref 30.0–36.0)
MCV: 91.3 fL (ref 78.0–100.0)
MONO ABS: 0.4 10*3/uL (ref 0.1–1.0)
MONOS PCT: 6 %
Neutro Abs: 3.5 10*3/uL (ref 1.7–7.7)
Neutrophils Relative %: 53 %
PLATELETS: 237 10*3/uL (ref 150–400)
RBC: 4.03 MIL/uL (ref 3.87–5.11)
RDW: 13 % (ref 11.5–15.5)
WBC: 6.7 10*3/uL (ref 4.0–10.5)

## 2015-06-28 LAB — COMPREHENSIVE METABOLIC PANEL
ALT: 16 U/L (ref 14–54)
ANION GAP: 8 (ref 5–15)
AST: 17 U/L (ref 15–41)
Albumin: 4.2 g/dL (ref 3.5–5.0)
Alkaline Phosphatase: 57 U/L (ref 38–126)
BUN: 12 mg/dL (ref 6–20)
CALCIUM: 8.6 mg/dL — AB (ref 8.9–10.3)
CHLORIDE: 104 mmol/L (ref 101–111)
CO2: 25 mmol/L (ref 22–32)
CREATININE: 0.84 mg/dL (ref 0.44–1.00)
Glucose, Bld: 146 mg/dL — ABNORMAL HIGH (ref 65–99)
Potassium: 3.7 mmol/L (ref 3.5–5.1)
SODIUM: 137 mmol/L (ref 135–145)
Total Bilirubin: 0.4 mg/dL (ref 0.3–1.2)
Total Protein: 7.3 g/dL (ref 6.5–8.1)

## 2015-06-28 MED ORDER — GABAPENTIN 300 MG PO CAPS: ORAL_CAPSULE | ORAL | Status: DC

## 2015-06-28 MED ORDER — HEPARIN SOD (PORK) LOCK FLUSH 100 UNIT/ML IV SOLN
INTRAVENOUS | Status: AC
  Filled 2015-06-28: qty 5

## 2015-06-28 MED ORDER — INFLUENZA VAC SPLIT QUAD 0.5 ML IM SUSY
0.5000 mL | PREFILLED_SYRINGE | Freq: Once | INTRAMUSCULAR | Status: AC
Start: 2015-06-28 — End: 2015-06-28
  Administered 2015-06-28: 0.5 mL via INTRAMUSCULAR
  Filled 2015-06-28: qty 0.5

## 2015-06-28 MED ORDER — SODIUM CHLORIDE 0.9 % IJ SOLN
10.0000 mL | INTRAMUSCULAR | Status: DC | PRN
  Administered 2015-06-28: 10 mL via INTRAVENOUS
  Filled 2015-06-28: qty 10

## 2015-06-28 MED ORDER — HEPARIN SOD (PORK) LOCK FLUSH 100 UNIT/ML IV SOLN
500.0000 [IU] | Freq: Once | INTRAVENOUS | Status: AC
  Administered 2015-06-28: 500 [IU] via INTRAVENOUS

## 2015-06-28 NOTE — Progress Notes (Signed)
Please see doctors encounter for more information 

## 2015-06-28 NOTE — Progress Notes (Signed)
Julia Osborne presents today for injection per MD orders. Flu Vaccine administered IM in left Upper Arm. Administration without incident. Patient tolerated well.  Julia Osborne presented for Portacath access and flush. Proper placement of portacath confirmed by CXR. Portacath located right chest wall accessed with  H 20 needles, double port. Good blood return present. Portacath flushed with 6ml NS and 500U/9ml Heparin and needle removed intact on each side. Procedure without incident. Patient tolerated procedure well.

## 2015-06-28 NOTE — Patient Instructions (Signed)
[place] at [facility] Discharge Instructions  RECOMMENDATIONS MADE BY THE CONSULTANT AND ANY TEST RESULTS WILL BE SENT TO YOUR REFERRING PHYSICIAN.  You got your flu vaccine today.   We flushed your port today and drew labs.  We will call you with any abnormal lab results.   We have scheduled your CT scan.  Please drink your contrast as instructed on the sheet.   Please see your schedule for return appointments.    Thank you for choosing [place] at [facility] to provide your oncology and hematology care.  To afford each patient quality time with our provider, please arrive at least 15 minutes before your scheduled appointment time.    You need to re-schedule your appointment should you arrive 10 or more minutes late.  We strive to give you quality time with our providers, and arriving late affects you and other patients whose appointments are after yours.  Also, if you no show three or more times for appointments you may be dismissed from the clinic at the providers discretion.     Again, thank you for choosing [facility].  Our hope is that these requests will decrease the amount of time that you wait before being seen by our physicians.       _____________________________________________________________  Should you have questions after your visit to [facility], please contact our office at [phone] between the hours of 8:30 a.m. and 4:30 p.m.  Voicemails left after 4:30 p.m. will not be returned until the following business day.  For prescription refill requests, have your pharmacy contact our office.

## 2015-06-28 NOTE — Progress Notes (Signed)
[place] PROGRESS NOTE  CHIEF COMPLAINTS/PURPOSE OF CONSULTATION:  Stage IV Endometrial Cancer, probable pulmonary metastases. Radical hysterectomy, BSO, pelvic LN dissection and vaginal biopsies 07/31/2014, patient had extensive pelvic disease including positive LN and positive distal vaginal biopsy  CA-125 137.41 on 07/19/2014 CA-125 25.72 on 09/27/2014  (normal range 0-20.9U/ml)  HISTORY OF PRESENTING ILLNESS:   Julia Osborne 54 y.o. female is here because of stage IV endometrial cancer. She has completed all therapy.   Ms. Scoles is here alone today. She states that she feels good aside from the neuropathy.  She remarks "I don't want to be painkiller dependent." "I don't want to be eating all these painkillers and stuff, because I'm trying to enjoy life again."  She is willing to try neurontin for her neuropathy. She says the other night she went out to eat with her boyfriend, but when she went to stand up, she fell because her feet were dead. She says she felt embarrassed because it appeared that she was drunk.  Ms. Beumer says her boyfriend is an "old farmer man" and he's real good to her. She say she's having to take care of the animals and the farm in general while he's out on the road driving his truck.  She has had her mammogram.  She says her bowels are okay. She's on Linzess. She has a colonoscopy coming up this week.  As far as her smoking, she says she is down to less than half a pack. Taking the Celexa helps keep her calm, so she doesn't need them. 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  She denies abdominal pain, SOB or change in appetite or energy level.    Endometrial cancer ([place])   07/12/2014 Imaging CT C/A/P, pelvic adenopathy, multiple pulmonary nodules concerning for metastatic disease   07/30/2014 Initial Diagnosis Endometrial cancer   07/31/2014 Pathology Results endometrioid adenocarcinoma, G2, lymphovascular invasion  present, positive pelvic lymph node and vaginal biopsy   07/31/2014 Definitive Surgery radical hysterectomy, BSO, pelvic lymph node dissection and vaginal biopsies   09/25/2014 Procedure Port-A-Cath placement in IR   10/18/2014 -  Chemotherapy Carboplatin/Taxol. First cycle given at [facility].     MEDICAL HISTORY:  Past Medical History  Diagnosis Date  . Asthma   . Uterine fibroid   . Cancer     endometrial  . COPD (chronic obstructive pulmonary disease)     no definite diagnosis  . Anemia   . Vaginal Pap smear, abnormal   . Indigestion   . Diverticulitis   . Vaginal dryness 05/16/2015  . Dyspareunia 05/16/2015    SURGICAL HISTORY: Past Surgical History  Procedure Laterality Date  . Tubal ligation    . Appendectomy    . Abdominal hysterectomy    . Portacath placement Right 09/2014  . Colonoscopy with propofol N/A 03/14/2015    RMR: Internal hemorrhoids. colonic diverticulosis. Incomplete examination. Prepartation inadequate.  . Esophagogastroduodenoscopy (egd) with propofol N/A 03/14/2015    RMR: Mild erosive reflux esophagitis status post passage o f a Maloney dilator. Abnormal gastric mucosa of uncertain significance as described above. status post biopsy, benign  . Esophageal dilation N/A 03/14/2015    Procedure: ESOPHAGEAL DILATION;  Surgeon: Daneil Dolin, MD;  Location: [facility];  Service: Endoscopy;  Laterality: N/A;  Maloney 5  . Esophageal biopsy N/A 03/14/2015    Procedure: BIOPSY;  Surgeon: Daneil Dolin, MD;  Location: [facility];  Service: Endoscopy;  Laterality: N/A;  Gastric  SOCIAL HISTORY: Social History   Social History  . Marital Status: Single    Spouse Name: N/A  . Number of Children: 2  . Years of Education: N/A   Occupational History  . Not on file.   Social History Main Topics  . Smoking status: Current Every Day Smoker -- 1.00 packs/day for 25 years    Types: Cigarettes  . Smokeless tobacco: Never Used     Comment: smokes 1 pack per week  . Alcohol  Use: Yes     Comment: once a year  . Drug Use: No  . Sexual Activity: Yes    Birth Control/ Protection: Surgical     Comment: hyst   Other Topics Concern  . Not on file   Social History Narrative  She was working, but now has applied for SSI and disability. She is now living with her niece. 2 children. 3 grandchildren, one is deceased. Divorced. Smoker 1/2 ppd. Trying to cut back.  Rare alcohol use.   FAMILY HISTORY: Family History  Problem Relation Age of Onset  . Other Mother     clot that went to heart, deceased age 20ss  . Heart attack Father     age 9s, deceased  . Stroke Father   . Cancer Sister     kidney  . Hypertension Sister   . Diabetes Brother   . Hypertension Brother   . Endometriosis Daughter   . Heart attack Maternal Grandfather   . Diabetes Sister   . Colon cancer Neg Hx    indicated that her mother is deceased. She indicated that her father is deceased. She indicated that both of her sisters are alive. She indicated that both of her brothers are alive. She indicated that her maternal grandmother is deceased. She indicated that her maternal grandfather is deceased. She indicated that her paternal grandmother is deceased. She indicated that her paternal grandfather is deceased. She indicated that her daughter is alive. She indicated that her son is alive.    Mother deceased at 84 from embolus Father deceased at 26 from MI 2 sisters and 2 brothers, Oldest sister had nephrectomy for RCC.   ALLERGIES:  is allergic to codeine.  MEDICATIONS:  Current Outpatient Prescriptions  Medication Sig Dispense Refill  . albuterol (PROVENTIL HFA;VENTOLIN HFA) 108 (90 BASE) MCG/ACT inhaler Inhale 2 puffs into the lungs every 2 (two) hours as needed for wheezing or shortness of breath (cough). (Patient taking differently: Inhale 1 puff into the lungs every 6 (six) hours as needed for wheezing or shortness of breath (cough). ) 1 Inhaler 0  . ALPRAZolam (XANAX) 0.25 MG tablet  Take 0.25 mg by mouth 3 (three) times daily as needed for anxiety.     . citalopram (CELEXA) 20 MG tablet Take 20 mg by mouth daily.    Marland Kitchen dexlansoprazole (DEXILANT) 60 MG capsule Take 60 mg by mouth daily.    . fexofenadine-pseudoephedrine (ALLEGRA-D) 60-120 MG per tablet Take 1 tablet by mouth every 12 (twelve) hours. 30 tablet 0  . Linaclotide (LINZESS) 145 MCG CAPS capsule Take 1 capsule (145 mcg total) by mouth daily before breakfast. 30 capsule 5  . omeprazole (PRILOSEC) 20 MG capsule Take 20 mg by mouth daily.    . ondansetron (ZOFRAN) 8 MG tablet TAKE 1 TABLET BY MOUTH EVERY 8 HOURS AS NEEDED FOR NAUSEA. 30 tablet 0  . traMADol (ULTRAM) 50 MG tablet TAKE (1) TABLET BY MOUTH EVERY (6) HOURS AS NEEDED. 30 tablet 2  . traZODone (  DESYREL) 50 MG tablet Take 50 mg by mouth at bedtime.    . gabapentin (NEURONTIN) 300 MG capsule Take one tablet at night for 5 days, then 2 at night for 5 days, then one in am, increase slowly to total of 1800 mg daily 180 capsule 3   Current Facility-Administered Medications  Medication Dose Route Frequency Provider Last Rate Last Dose  . sodium chloride 0.9 % injection 10 mL  10 mL Intravenous PRN Patrici Ranks, MD   10 mL at 06/28/15 1200    Review of Systems  Constitutional: Negative for fever, chills, weight loss and malaise/fatigue.  HENT: Negative for congestion, hearing loss, nosebleeds, sore throat and tinnitus.   Eyes: Negative for blurred vision, double vision, pain and discharge.  Respiratory: Negative for cough, hemoptysis, sputum production, shortness of breath and wheezing.   Cardiovascular: Negative for chest pain, palpitations, claudication, leg swelling and PND.  Gastrointestinal: Negative for heartburn, abdominal pain, diarrhea, constipation, blood in stool and melena. Positive for nausea Genitourinary: Negative for dysuria, urgency, frequency and hematuria.  Musculoskeletal: Negative for myalgias, joint pain and falls.  Skin: Negative for  itching and rash.  Neurological: Negative for dizziness, tingling, tremors, sensory change, speech change, focal weakness, seizures, loss of consciousness, weakness and headaches.  Endo/Heme/Allergies: Does not bruise/bleed easily.  Psychiatric/Behavioral: Negative for depression, suicidal ideas, memory loss and substance abuse. The patient is not nervous/anxious and does not have insomnia.    14 point review of systems was performed and is negative except as detailed under history of present illness and above   PHYSICAL EXAMINATION:  ECOG PERFORMANCE STATUS: 0 - Asymptomatic  Filed Vitals:   06/28/15 1031  BP: 148/81  Pulse: 68  Temp: 98.1 F (36.7 C)  Resp: 18   Filed Weights   06/28/15 1031  Weight: 151 lb 6.4 oz (68.675 kg)     Physical Exam  Constitutional: She is oriented to person, place, and time and well-developed, well-nourished, and in no distress. hair regrowth noted  Well groomed HENT:  Head: Normocephalic and atraumatic.  Nose: Nose normal.  Mouth/Throat: Oropharynx is clear and moist. No oropharyngeal exudate.  Eyes: Conjunctivae and EOM are normal. Pupils are equal, round, and reactive to light. Right eye exhibits no discharge. Left eye exhibits no discharge. No scleral icterus.  Neck: Normal range of motion. Neck supple. No tracheal deviation present. No thyromegaly present.  Cardiovascular: Normal rate, regular rhythm and normal heart sounds.  Exam reveals no gallop and no friction rub.   No murmur heard. Pulmonary/Chest: Effort normal and breath sounds normal. She has no wheezes. She has no rales.  Abdominal: Soft. Bowel sounds are normal. She exhibits no distension and no mass. There is no tenderness. There is no rebound and no guarding.  Musculoskeletal: Normal range of motion. She exhibits no edema.  Lymphadenopathy:    She has no cervical adenopathy.  Neurological: She is alert and oriented to person, place, and time. She has normal reflexes. No cranial  nerve deficit. Gait normal. Coordination normal.  Skin: Skin is warm and dry. No rash noted.  Psychiatric: Mood, memory, affect and judgment normal.  Nursing note and vitals reviewed.  LABORATORY DATA:  I have reviewed the data as listed Lab Results  Component Value Date   WBC 6.7 06/28/2015   HGB 12.7 06/28/2015   HCT 36.8 06/28/2015   MCV 91.3 06/28/2015   PLT 237 06/28/2015     Chemistry      Component Value Date/Time  NA 137 06/28/2015 1139   K 3.7 06/28/2015 1139   CL 104 06/28/2015 1139   CO2 25 06/28/2015 1139   BUN 12 06/28/2015 1139   CREATININE 0.84 06/28/2015 1139      Component Value Date/Time   CALCIUM 8.6* 06/28/2015 1139   ALKPHOS 57 06/28/2015 1139   AST 17 06/28/2015 1139   ALT 16 06/28/2015 1139   BILITOT 0.4 06/28/2015 1139     RADIOLOGY: I have reviewed the images listed below and agree with the results  02/26/2015 CLINICAL DATA: Stage IV endometrial cancer diagnosed in October 2015. Complete hysterectomy with chemotherapy completed 2 months ago. Nausea. Restaging. Subsequent encounter.  EXAM: CT CHEST, ABDOMEN, AND PELVIS WITH CONTRAST  TECHNIQUE: Multidetector CT imaging of the chest, abdomen and pelvis was performed following the standard protocol during bolus administration of intravenous contrast.  CONTRAST: 150mL OMNIPAQUE IOHEXOL 300 MG/ML SOLN  COMPARISON: CT 11/20/2014 and 08/31/2007.  FINDINGS: CT CHEST FINDINGS  Mediastinum/Nodes: There are no enlarged mediastinal, hilar or axillary lymph nodes. The thyroid gland, trachea and esophagus demonstrate no significant findings. The heart size is normal. There is no pericardial effusion.Right IJ Port-A-Cath tip appears unchanged in the upper right atrium.  Lungs/Pleura: There is no pleural effusion.The 5 mm subpleural right middle lobe nodule on image number 38 is unchanged. No new or enlarging pulmonary nodules demonstrated.  Musculoskeletal/Chest wall: No chest  wall mass or suspicious osseous findings.  CT ABDOMEN AND PELVIS FINDINGS  Hepatobiliary: The liver is normal in density without focal abnormality. No evidence of gallstones, gallbladder wall thickening or biliary dilatation.  Pancreas: Unremarkable. No pancreatic ductal dilatation or surrounding inflammatory changes.  Spleen: Normal in size without focal abnormality.  Adrenals/Urinary Tract: Both adrenal glands appear normal.The kidneys appear normal without evidence of urinary tract calculus, suspicious lesion or hydronephrosis. No bladder abnormalities are seen.  Stomach/Bowel: No evidence of bowel wall thickening, distention or surrounding inflammatory change.Prominent stool is again noted throughout the colon.  Vascular/Lymphatic: Small periaortic and left common iliac lymph nodes are unchanged. There is no pelvic lymphadenopathy. Mild aortoiliac atherosclerosis appears unchanged.  Reproductive: Status post hysterectomy. No evidence of pelvic mass.  Other: Stable postsurgical changes within the infraumbilical abdominal wall. No abdominal wall mass or hernia.  Musculoskeletal: No acute or significant osseous findings. Stable degenerative disc disease at L5-S1.  IMPRESSION: 1. Stable CTs of the chest, abdomen and pelvis. No evidence of metastatic disease. 2. Stable 5 mm subpleural right middle lobe nodule, likely a lymph node. 3. No new or acute findings. 4. Prominent stool throughout the colon suggesting constipation.   Electronically Signed  By: Richardean Sale M.D.  On: 02/26/2015 12:51    EXAM: DIGITAL SCREENING BILATERAL MAMMOGRAM WITH 3D TOMO WITH CAD  COMPARISON: None.  ACR Breast Density Category b: There are scattered areas of fibroglandular density.  FINDINGS: There are no findings suspicious for malignancy. Images were processed with CAD.  IMPRESSION: No mammographic evidence of malignancy. A result letter of  this screening mammogram will be mailed directly to the patient.  RECOMMENDATION: Screening mammogram in one year. (Code:SM-B-01Y)  BI-RADS CATEGORY 1: Negative.   Electronically Signed  By: Nolon Nations M.D.  On: 02/25/2015 17:08    ASSESSMENT & PLAN:  Stage IV Endometrial Carcinoma diagnosed at [place] [facility] Nausea Chemotherapy induced neuropathy  She is doing very well. She is due for repeat imaging and we will set this up. I have given her a prescription for Neurontin with instructions on how to titrate the medication  for treatment of her neuropathy. I have encouraged her that it can take some time to acclimate to the drug. Thus potential side effects of the medication including drowsiness.  I will see her back in 6 weeks to reassess her treatment induced neuropathy and tolerance to Neurontin. We will review her CT imaging. If she is interim problems or concerns have encouraged her to call.  She will also get her flu shot today.   Orders Placed This Encounter  Procedures  . CT Abdomen Pelvis W Contrast    [facility]/AM[facility]       MEDICAID, WILL CHK ON PAC       LABS DRAWN 10/07   WILL BE GIVEN READICAT BY CLINIC    Standing Status: Future     Number of Occurrences:      Standing Expiration Date: 06/27/2016    Order Specific Question:  Reason for Exam (SYMPTOM  OR DIAGNOSIS REQUIRED)    Answer:  restaging stage IV endometrial cancer    Order Specific Question:  Is the patient pregnant?    Answer:  No    Order Specific Question:  Preferred imaging location?    Answer:  [place] [facility]      LABS DRAWN 10/07    Standing Status: Future     Number of Occurrences:      Standing Expiration Date: 06/27/2016    Order Specific Question:  Reason for Exam (SYMPTOM  OR DIAGNOSIS REQUIRED)    Answer:  restaging stage IV endometrial cancer    Order Specific Question:  Is the patient pregnant?    Answer:  No     Order Specific Question:  Preferred imaging location?    Answer:  [facility]   All questions were answered. The patient knows to call the clinic with any problems, questions or concerns.  This document serves as a record of services personally performed by Ancil Linsey, MD. It was created on her behalf by Toni Amend, a trained medical scribe. The creation of this record is based on the scribe's personal observations and the provider's statements to them. This document has been checked and approved by the attending provider.  I have reviewed the above documentation for accuracy and completeness, and I agree with the above. This note was electronically signed Kelby Fam. Penland MD

## 2015-06-29 LAB — CA 125: CA 125: 15.8 U/mL (ref 0.0–38.1)

## 2015-07-01 ENCOUNTER — Encounter (HOSPITAL_COMMUNITY)
Admission: RE | Admit: 2015-07-01 | Discharge: 2015-07-01 | Disposition: A | Discharge status: Home / Self Care | Payer: Medicaid Other | Source: Ambulatory Visit | Attending: Internal Medicine | Admitting: Internal Medicine

## 2015-07-01 ENCOUNTER — Encounter (HOSPITAL_COMMUNITY): Payer: Self-pay | Admitting: Hematology & Oncology

## 2015-07-01 ENCOUNTER — Telehealth (HOSPITAL_COMMUNITY): Payer: Self-pay

## 2015-07-01 ENCOUNTER — Other Ambulatory Visit: Payer: Self-pay

## 2015-07-01 ENCOUNTER — Encounter (HOSPITAL_COMMUNITY): Payer: Self-pay

## 2015-07-01 DIAGNOSIS — K625 Hemorrhage of anus and rectum: Secondary | ICD-10-CM | POA: Insufficient documentation

## 2015-07-01 DIAGNOSIS — Z01818 Encounter for other preprocedural examination: Secondary | ICD-10-CM | POA: Insufficient documentation

## 2015-07-01 HISTORY — DX: Gastro-esophageal reflux disease without esophagitis: K21.9

## 2015-07-01 HISTORY — DX: Major depressive disorder, single episode, unspecified: F32.9

## 2015-07-01 HISTORY — DX: Anxiety disorder, unspecified: F41.9

## 2015-07-01 HISTORY — DX: Depression, unspecified: F32.A

## 2015-07-01 HISTORY — DX: Polyneuropathy, unspecified: G62.9

## 2015-07-01 LAB — BASIC METABOLIC PANEL
Anion gap: 7 (ref 5–15)
BUN: 13 mg/dL (ref 6–20)
CO2: 25 mmol/L (ref 22–32)
CREATININE: 0.94 mg/dL (ref 0.44–1.00)
Calcium: 8.3 mg/dL — ABNORMAL LOW (ref 8.9–10.3)
Chloride: 104 mmol/L (ref 101–111)
GFR calc Af Amer: >60 mL/min (ref >60)
GLUCOSE: 125 mg/dL — AB (ref 65–99)
Potassium: 3.8 mmol/L (ref 3.5–5.1)
SODIUM: 136 mmol/L (ref 135–145)

## 2015-07-01 LAB — CBC
HCT: 36 % (ref 36.0–46.0)
Hemoglobin: 12.3 g/dL (ref 12.0–15.0)
MCH: 31.4 pg (ref 26.0–34.0)
MCHC: 34.2 g/dL (ref 30.0–36.0)
MCV: 91.8 fL (ref 78.0–100.0)
PLATELETS: 208 10*3/uL (ref 150–400)
RBC: 3.92 MIL/uL (ref 3.87–5.11)
RDW: 12.7 % (ref 11.5–15.5)
WBC: 5.4 10*3/uL (ref 4.0–10.5)

## 2015-07-01 NOTE — Pre-Procedure Instructions (Signed)
Patient given information to sign up for my chart at home. 

## 2015-07-01 NOTE — Telephone Encounter (Signed)
-----   Message from Patrici Ranks, MD sent at 07/01/2015  8:28 AM EDT -----  Advise patient CA-125 is normal. Dr.P

## 2015-07-01 NOTE — Telephone Encounter (Signed)
Message left for patient.  To call back with any questions. 

## 2015-07-04 ENCOUNTER — Ambulatory Visit (HOSPITAL_COMMUNITY): Payer: Medicaid Other | Admitting: Anesthesiology

## 2015-07-04 ENCOUNTER — Ambulatory Visit (HOSPITAL_COMMUNITY)
Admission: RE | Admit: 2015-07-04 | Discharge: 2015-07-04 | Disposition: A | Discharge status: Home / Self Care | Payer: Medicaid Other | Source: Ambulatory Visit | Attending: Internal Medicine | Admitting: Internal Medicine

## 2015-07-04 ENCOUNTER — Encounter (HOSPITAL_COMMUNITY): Payer: Self-pay | Admitting: *Deleted

## 2015-07-04 ENCOUNTER — Encounter (HOSPITAL_COMMUNITY)
Admission: RE | Disposition: A | Discharge status: Home / Self Care | Payer: Self-pay | Source: Ambulatory Visit | Attending: Internal Medicine

## 2015-07-04 DIAGNOSIS — Z8542 Personal history of malignant neoplasm of other parts of uterus: Secondary | ICD-10-CM | POA: Insufficient documentation

## 2015-07-04 DIAGNOSIS — Z90710 Acquired absence of both cervix and uterus: Secondary | ICD-10-CM | POA: Insufficient documentation

## 2015-07-04 DIAGNOSIS — K921 Melena: Secondary | ICD-10-CM

## 2015-07-04 DIAGNOSIS — Z9221 Personal history of antineoplastic chemotherapy: Secondary | ICD-10-CM | POA: Diagnosis not present

## 2015-07-04 DIAGNOSIS — R938 Abnormal findings on diagnostic imaging of other specified body structures: Secondary | ICD-10-CM | POA: Diagnosis not present

## 2015-07-04 DIAGNOSIS — F17200 Nicotine dependence, unspecified, uncomplicated: Secondary | ICD-10-CM | POA: Diagnosis not present

## 2015-07-04 DIAGNOSIS — D125 Benign neoplasm of sigmoid colon: Secondary | ICD-10-CM | POA: Insufficient documentation

## 2015-07-04 DIAGNOSIS — K649 Unspecified hemorrhoids: Secondary | ICD-10-CM | POA: Insufficient documentation

## 2015-07-04 DIAGNOSIS — Z86010 Personal history of colonic polyps: Secondary | ICD-10-CM | POA: Insufficient documentation

## 2015-07-04 DIAGNOSIS — K635 Polyp of colon: Secondary | ICD-10-CM

## 2015-07-04 DIAGNOSIS — D123 Benign neoplasm of transverse colon: Secondary | ICD-10-CM | POA: Diagnosis not present

## 2015-07-04 DIAGNOSIS — J449 Chronic obstructive pulmonary disease, unspecified: Secondary | ICD-10-CM | POA: Insufficient documentation

## 2015-07-04 HISTORY — PX: COLONOSCOPY WITH PROPOFOL: SHX5780

## 2015-07-04 HISTORY — PX: POLYPECTOMY: SHX5525

## 2015-07-04 SURGERY — COLONOSCOPY WITH PROPOFOL
Anesthesia: Monitor Anesthesia Care

## 2015-07-04 MED ORDER — FENTANYL CITRATE (PF) 100 MCG/2ML IJ SOLN: 25.0000 ug | INTRAMUSCULAR | Status: DC | PRN

## 2015-07-04 MED ORDER — PROPOFOL 10 MG/ML IV BOLUS
INTRAVENOUS | Status: AC
  Filled 2015-07-04: qty 20

## 2015-07-04 MED ORDER — NEOSTIGMINE METHYLSULFATE 10 MG/10ML IV SOLN
INTRAVENOUS | Status: AC
  Filled 2015-07-04: qty 2

## 2015-07-04 MED ORDER — ONDANSETRON HCL 4 MG/2ML IJ SOLN
INTRAMUSCULAR | Status: AC
  Filled 2015-07-04: qty 2

## 2015-07-04 MED ORDER — LIDOCAINE HCL (CARDIAC) 10 MG/ML IV SOLN
INTRAVENOUS | Status: DC | PRN
  Administered 2015-07-04: 50 mg via INTRAVENOUS

## 2015-07-04 MED ORDER — LACTATED RINGERS IV SOLN
INTRAVENOUS | Status: DC
  Administered 2015-07-04: 11:00:00 via INTRAVENOUS

## 2015-07-04 MED ORDER — ONDANSETRON HCL 4 MG/2ML IJ SOLN: 4.0000 mg | Freq: Once | INTRAMUSCULAR | Status: DC | PRN

## 2015-07-04 MED ORDER — FENTANYL CITRATE (PF) 100 MCG/2ML IJ SOLN
25.0000 ug | INTRAMUSCULAR | Status: AC
  Administered 2015-07-04 (×2): 25 ug via INTRAVENOUS

## 2015-07-04 MED ORDER — FENTANYL CITRATE (PF) 100 MCG/2ML IJ SOLN
INTRAMUSCULAR | Status: AC
  Filled 2015-07-04: qty 2

## 2015-07-04 MED ORDER — STERILE WATER FOR IRRIGATION IR SOLN
Status: DC | PRN
Start: 2015-07-04 — End: 2015-07-04
  Administered 2015-07-04: 1000 mL

## 2015-07-04 MED ORDER — GLYCOPYRROLATE 0.2 MG/ML IJ SOLN
0.2000 mg | Freq: Once | INTRAMUSCULAR | Status: AC
  Administered 2015-07-04: 0.2 mg via INTRAVENOUS

## 2015-07-04 MED ORDER — SODIUM CHLORIDE 0.9 % IV SOLN
INTRAVENOUS | Status: DC | PRN
  Administered 2015-07-04: 13 mL via INTRAMUSCULAR

## 2015-07-04 MED ORDER — GLYCOPYRROLATE 0.2 MG/ML IJ SOLN
INTRAMUSCULAR | Status: AC
  Filled 2015-07-04: qty 1

## 2015-07-04 MED ORDER — ARTIFICIAL TEARS OP OINT
TOPICAL_OINTMENT | OPHTHALMIC | Status: AC
Start: 2015-07-04 — End: 2015-07-04
  Filled 2015-07-04: qty 3.5

## 2015-07-04 MED ORDER — ONDANSETRON HCL 4 MG/2ML IJ SOLN
4.0000 mg | Freq: Once | INTRAMUSCULAR | Status: AC
  Administered 2015-07-04: 4 mg via INTRAVENOUS

## 2015-07-04 MED ORDER — MIDAZOLAM HCL 5 MG/5ML IJ SOLN
INTRAMUSCULAR | Status: DC | PRN
  Administered 2015-07-04: 2 mg via INTRAVENOUS

## 2015-07-04 MED ORDER — LIDOCAINE HCL (PF) 1 % IJ SOLN
INTRAMUSCULAR | Status: AC
  Filled 2015-07-04: qty 5

## 2015-07-04 MED ORDER — MIDAZOLAM HCL 2 MG/2ML IJ SOLN
INTRAMUSCULAR | Status: AC
  Filled 2015-07-04: qty 4

## 2015-07-04 MED ORDER — SPOT INK MARKER SYRINGE KIT
PACK | SUBMUCOSAL | Status: DC | PRN
  Administered 2015-07-04: 4 mL via SUBMUCOSAL
  Administered 2015-07-04: 3 mL via SUBMUCOSAL

## 2015-07-04 MED ORDER — PROPOFOL 10 MG/ML IV BOLUS
INTRAVENOUS | Status: DC | PRN
  Administered 2015-07-04 (×2): 14 mg via INTRAVENOUS
  Administered 2015-07-04: 20 mg via INTRAVENOUS

## 2015-07-04 MED ORDER — ATROPINE SULFATE 0.4 MG/ML IJ SOLN
INTRAMUSCULAR | Status: AC
  Filled 2015-07-04: qty 1

## 2015-07-04 MED ORDER — PROPOFOL 500 MG/50ML IV EMUL
INTRAVENOUS | Status: DC | PRN
  Administered 2015-07-04: 125 ug/kg/min via INTRAVENOUS
  Administered 2015-07-04: 110 ug/kg/min via INTRAVENOUS
  Administered 2015-07-04: 90 ug/kg/min via INTRAVENOUS

## 2015-07-04 MED ORDER — MIDAZOLAM HCL 2 MG/2ML IJ SOLN
INTRAMUSCULAR | Status: AC
  Filled 2015-07-04: qty 2

## 2015-07-04 MED ORDER — WATER FOR IRRIGATION, STERILE IR SOLN
Status: DC | PRN
  Administered 2015-07-04: 1000 mL

## 2015-07-04 MED ORDER — GLYCOPYRROLATE 0.2 MG/ML IJ SOLN
INTRAMUSCULAR | Status: AC
  Filled 2015-07-04: qty 3

## 2015-07-04 MED ORDER — MIDAZOLAM HCL 2 MG/2ML IJ SOLN
1.0000 mg | INTRAMUSCULAR | Status: DC | PRN
  Administered 2015-07-04: 2 mg via INTRAVENOUS

## 2015-07-04 MED ORDER — SUCCINYLCHOLINE CHLORIDE 20 MG/ML IJ SOLN
INTRAMUSCULAR | Status: AC
  Filled 2015-07-04: qty 1

## 2015-07-04 SURGICAL SUPPLY — 25 items
ELECT REM PT RETURN 9FT ADLT (ELECTROSURGICAL)
ELECTRODE REM PT RTRN 9FT ADLT (ELECTROSURGICAL) IMPLANT
FCP BXJMBJMB 240X2.8X (CUTTING FORCEPS)
FLOOR PAD 36X40 (MISCELLANEOUS)
FORCEPS BIOP RAD 4 LRG CAP 4 (CUTTING FORCEPS) IMPLANT
FORCEPS BIOP RJ4 240 W/NDL (CUTTING FORCEPS)
FORCEPS BXJMBJMB 240X2.8X (CUTTING FORCEPS) IMPLANT
FORMALIN 10 PREFIL 20ML (MISCELLANEOUS) ×6 IMPLANT
INJECTOR/SNARE I SNARE (MISCELLANEOUS) IMPLANT
KIT ENDO PROCEDURE PEN (KITS) ×3 IMPLANT
MANIFOLD NEPTUNE II (INSTRUMENTS) ×3 IMPLANT
MARKER SPOT ENDO TATTOO 5ML (MISCELLANEOUS) ×2 IMPLANT
NEEDLE SCLEROTHERAPY 25GX240 (NEEDLE) ×3 IMPLANT
PAD FLOOR 36X40 (MISCELLANEOUS) IMPLANT
PROBE APC CIRCUMFERENTIAL (PROBE) ×3 IMPLANT
PROBE APC STR FIRE (PROBE) IMPLANT
PROBE INJECTION GOLD (MISCELLANEOUS)
PROBE INJECTION GOLD 7FR (MISCELLANEOUS) IMPLANT
SNARE ROTATE MED OVAL 20MM (MISCELLANEOUS) ×3 IMPLANT
SNARE SHORT THROW 13M SML OVAL (MISCELLANEOUS) IMPLANT
SPOT EX ENDOSCOPIC TATTOO (MISCELLANEOUS) ×4
SYR 50ML LL SCALE MARK (SYRINGE) ×3 IMPLANT
TRAP SPECIMEN MUCOUS 40CC (MISCELLANEOUS) IMPLANT
TUBING IRRIGATION ENDOGATOR (MISCELLANEOUS) ×3 IMPLANT
WATER STERILE IRR 1000ML POUR (IV SOLUTION) ×3 IMPLANT

## 2015-07-04 NOTE — Interval H&P Note (Signed)
History and Physical Interval Note:  07/04/2015 10:38 AM  Julia Osborne  has presented today for surgery, with the diagnosis of RECTAL BLEEDING  The various methods of treatment have been discussed with the patient and family. After consideration of risks, benefits and other options for treatment, the patient has consented to  Procedure(s): COLONOSCOPY WITH PROPOFOL (N/A) as a surgical intervention .  The patient's history has been reviewed, patient examined, no change in status, stable for surgery.  I have reviewed the patient's chart and labs.  Questions were answered to the patient's satisfaction.     Julia Osborne  No change. Diagnostic colonoscopy per plan.  The risks, benefits, limitations, alternatives and imponderables have been reviewed with the patient. Questions have been answered. All parties are agreeable.

## 2015-07-04 NOTE — Anesthesia Postprocedure Evaluation (Signed)
  Anesthesia Post-op Note  Patient: Julia Osborne  Procedure(s) Performed: Procedure(s): COLONOSCOPY WITH PROPOFOL; IN CECUM AT 1108; WITHDRAWAL TIME 52 MINUTES (N/A) POLYPECTOMY (N/A)  Patient Location: PACU  Anesthesia Type:MAC  Level of Consciousness: awake and patient cooperative  Airway and Oxygen Therapy: Patient Spontanous Breathing  Post-op Pain: none  Post-op Assessment: Post-op Vital signs reviewed, Patient's Cardiovascular Status Stable and Respiratory Function Stable              Post-op Vital Signs: Reviewed and stable  Last Vitals:  Filed Vitals:   07/04/15 1215  BP: 121/69  Pulse: 64  Temp:   Resp: 13    Complications: No apparent anesthesia complications

## 2015-07-04 NOTE — Anesthesia Preprocedure Evaluation (Signed)
Anesthesia Evaluation  Patient identified by MRN, date of birth, ID band Patient awake    Reviewed: Allergy & Precautions, NPO status , Patient's Chart, lab work & pertinent test results  Airway Mallampati: I  TM Distance: >3 FB Neck ROM: Full    Dental  (+) Teeth Intact   Pulmonary asthma , COPD,  COPD inhaler, Current Smoker,    breath sounds clear to auscultation       Cardiovascular negative cardio ROS   Rhythm:Regular Rate:Normal     Neuro/Psych PSYCHIATRIC DISORDERS Anxiety Depression    GI/Hepatic Hiatal hernia: dysphagia., GERD  Medicated,  Endo/Other    Renal/GU      Musculoskeletal   Abdominal   Peds  Hematology  (+) anemia ,   Anesthesia Other Findings   Reproductive/Obstetrics                             Anesthesia Physical Anesthesia Plan  ASA: II  Anesthesia Plan: MAC   Post-op Pain Management:    Induction: Intravenous  Airway Management Planned: Simple Face Mask  Additional Equipment:   Intra-op Plan:   Post-operative Plan:   Informed Consent: I have reviewed the patients History and Physical, chart, labs and discussed the procedure including the risks, benefits and alternatives for the proposed anesthesia with the patient or authorized representative who has indicated his/her understanding and acceptance.     Plan Discussed with:   Anesthesia Plan Comments:         Anesthesia Quick Evaluation

## 2015-07-04 NOTE — Transfer of Care (Signed)
Immediate Anesthesia Transfer of Care Note  Patient: Julia Osborne  Procedure(s) Performed: Procedure(s) (LRB): COLONOSCOPY WITH PROPOFOL; IN CECUM AT 1108; WITHDRAWAL TIME 60 MINUTES (N/A) POLYPECTOMY (N/A)  Patient Location: PACU  Anesthesia Type: MAC  Level of Consciousness: awake  Airway & Oxygen Therapy: Patient Spontanous Breathing.   Post-op Assessment: Report given to PACU RN, Post -op Vital signs reviewed and stable and Patient moving all extremities  Post vital signs: Reviewed and stable  Complications: No apparent anesthesia complications

## 2015-07-04 NOTE — Discharge Instructions (Signed)
Colonoscopy Discharge Instructions  Read the instructions outlined below and refer to this sheet in the next few weeks. These discharge instructions provide you with general information on caring for yourself after you leave the hospital. Your doctor may also give you specific instructions. While your treatment has been planned according to the most current medical practices available, unavoidable complications occasionally occur. If you have any problems or questions after discharge, call Dr. Gala Romney at [phone]. ACTIVITY  You may resume your regular activity, but move at a slower pace for the next 24 hours.   Take frequent rest periods for the next 24 hours.   Walking will help get rid of the air and reduce the bloated feeling in your belly (abdomen).   No driving for 24 hours (because of the medicine (anesthesia) used during the test).    Do not sign any important legal documents or operate any machinery for 24 hours (because of the anesthesia used during the test).  NUTRITION  Drink plenty of fluids.   You may resume your normal diet as instructed by your doctor.   Begin with a light meal and progress to your normal diet. Heavy or fried foods are harder to digest and may make you feel sick to your stomach (nauseated).   Avoid alcoholic beverages for 24 hours or as instructed.  MEDICATIONS  You may resume your normal medications unless your doctor tells you otherwise.  WHAT YOU CAN EXPECT TODAY  Some feelings of bloating in the abdomen.   Passage of more gas than usual.   Spotting of blood in your stool or on the toilet paper.  IF YOU HAD POLYPS REMOVED DURING THE COLONOSCOPY:  No aspirin products for 7 days or as instructed.   No alcohol for 7 days or as instructed.   Eat a soft diet for the next 24 hours.  FINDING OUT THE RESULTS OF YOUR TEST Not all test results are available during your visit. If your test results are not back during the visit, make an appointment  with your caregiver to find out the results. Do not assume everything is normal if you have not heard from your caregiver or the medical facility. It is important for you to follow up on all of your test results.  SEEK IMMEDIATE MEDICAL ATTENTION IF:  You have more than a spotting of blood in your stool.   Your belly is swollen (abdominal distention).   You are nauseated or vomiting.   You have a temperature over 101.   You have abdominal pain or discomfort that is severe or gets worse throughout the day.    Polyp and diverticulosis information provided  Further recommendations to follow pending review of pathology report  No aspirin or Advil, Aleve, etc. 10 days    Diverticulosis Diverticulosis is the condition that develops when small pouches (diverticula) form in the wall of your colon. Your colon, or large intestine, is where water is absorbed and stool is formed. The pouches form when the inside layer of your colon pushes through weak spots in the outer layers of your colon. CAUSES  No one knows exactly what causes diverticulosis. RISK FACTORS  Being older than 81. Your risk for this condition increases with age. Diverticulosis is rare in people younger than 40 years. By age 40, almost everyone has it.  Eating a low-fiber diet.  Being frequently constipated.  Being overweight.  Not getting enough exercise.  Smoking.  Taking over-the-counter pain medicines, like aspirin and ibuprofen. SYMPTOMS  Most people with diverticulosis do not have symptoms. DIAGNOSIS  Because diverticulosis often has no symptoms, health care providers often discover the condition during an exam for other colon problems. In many cases, a health care provider will diagnose diverticulosis while using a flexible scope to examine the colon (colonoscopy). TREATMENT  If you have never developed an infection related to diverticulosis, you may not need treatment. If you have had an infection before,  treatment may include:  Eating more fruits, vegetables, and grains.  Taking a fiber supplement.  Taking a live bacteria supplement (probiotic).  Taking medicine to relax your colon. HOME CARE INSTRUCTIONS   Drink at least 6-8 glasses of water each day to prevent constipation.  Try not to strain when you have a bowel movement.  Keep all follow-up appointments. If you have had an infection before:  Increase the fiber in your diet as directed by your health care provider or dietitian.  Take a dietary fiber supplement if your health care provider approves.  Only take medicines as directed by your health care provider. SEEK MEDICAL CARE IF:   You have abdominal pain.  You have bloating.  You have cramps.  You have not gone to the bathroom in 3 days. SEEK IMMEDIATE MEDICAL CARE IF:   Your pain gets worse.  Yourbloating becomes very bad.  You have a fever or chills, and your symptoms suddenly get worse.  You begin vomiting.  You have bowel movements that are bloody or black. MAKE SURE YOU:  Understand these instructions.  Will watch your condition.  Will get help right away if you are not doing well or get worse.   This information is not intended to replace advice given to you by your health care provider. Make sure you discuss any questions you have with your health care provider.   Document Released: 06/04/2004 Document Revised: 09/12/2013 Document Reviewed: 08/02/2013 Elsevier Interactive Patient Education 2016 Else[employer] Colon Polyps Polyps are lumps of extra tissue growing inside the body. Polyps can grow in the large intestine (colon). Most colon polyps are noncancerous (benign). However, some colon polyps can become cancerous over time. Polyps that are larger than a pea may be harmful. To be safe, caregivers remove and test all polyps. CAUSES  Polyps form when mutations in the genes cause your cells to grow and divide even though no more tissue is  needed. RISK FACTORS There are a number of risk factors that can increase your chances of getting colon polyps. They include:  Being older than 50 years.  Family history of colon polyps or colon cancer.  Long-term colon diseases, such as colitis or Crohn disease.  Being overweight.  Smoking.  Being inactive.  Drinking too much alcohol. SYMPTOMS  Most small polyps do not cause symptoms. If symptoms are present, they may include:  Blood in the stool. The stool may look dark red or black.  Constipation or diarrhea that lasts longer than 1 week. DIAGNOSIS People often do not know they have polyps until their caregiver finds them during a regular checkup. Your caregiver can use 4 tests to check for polyps:  Digital rectal exam. The caregiver wears gloves and feels inside the rectum. This test would find polyps only in the rectum.  Barium enema. The caregiver puts a liquid called barium into your rectum before taking X-rays of your colon. Barium makes your colon look white. Polyps are dark, so they are easy to see in the X-ray pictures.  Sigmoidoscopy. A thin, flexible tube (  sigmoidoscope) is placed into your rectum. The sigmoidoscope has a light and tiny camera in it. The caregiver uses the sigmoidoscope to look at the last third of your colon.  Colonoscopy. This test is like sigmoidoscopy, but the caregiver looks at the entire colon. This is the most common method for finding and removing polyps. TREATMENT  Any polyps will be removed during a sigmoidoscopy or colonoscopy. The polyps are then tested for cancer. PREVENTION  To help lower your risk of getting more colon polyps:  Eat plenty of fruits and vegetables. Avoid eating fatty foods.  Do not smoke.  Avoid drinking alcohol.  Exercise every day.  Lose weight if recommended by your caregiver.  Eat plenty of calcium and folate. Foods that are rich in calcium include milk, cheese, and broccoli. Foods that are rich in folate  include chickpeas, kidney beans, and spinach. HOME CARE INSTRUCTIONS Keep all follow-up appointments as directed by your caregiver. You may need periodic exams to check for polyps. SEEK MEDICAL CARE IF: You notice bleeding during a bowel movement.   This information is not intended to replace advice given to you by your health care provider. Make sure you discuss any questions you have with your health care provider.   Document Released: 06/03/2004 Document Revised: 09/28/2014 Document Reviewed: 11/17/2011 Elsevier Interactive Patient Education [employer].

## 2015-07-04 NOTE — H&P (View-Only) (Signed)
Primary Care Physician:  [place]  Primary Gastroenterologist:  Garfield Cornea, MD   Chief Complaint  Patient presents with  . Follow-up  . reschedule her procedure    HPI:  Julia Osborne is a 54 y.o. female here for follow-up to reschedule colonoscopy. Recently underwent attempted colonoscopy for rectal bleeding and abnormal sigmoid colon noted on CT scan. Incomplete examination due to inadequate preparation. She did undergo an EGD with Maloney dilation at that time. She had mild erosive reflux esophagitis and an upper esophageal stricture, abnormal gastric mucosa with benign biopsies. She was switched to Dexilant at that time.   She was diagnosed stage IV endometrial cancer, probable pulmonary metastases back in October 2015. Underwent radical hysterectomy, bilateral salpingo-oophorectomy, pelvic lymph node dissection and vaginal biopsies in November 2015. She had extensive pelvic disease including positive lymph nodes and positive distal vaginal biopsy. She has completed chemotherapy. Her last scans as outlined below. She had mild sigmoid colon wall thickening and diverticulosis without surrounding inflammatory change. Describes having a colonoscopy 10-15 years ago at [facility], cannot remember the results.  Patient states that she's doing better from upper GI standpoint on new PPI. She is also trying avoid spicy foods which tend to make her reflux worse. Weight is fairly stable. She notes bowel movements occur 1-2 times daily although [place] stool 2. With recent attempted colonoscopy, she states she drank half of a gallon of TriLyte daily but then had vomiting and was unable to complete it. She has issues with chronic nausea since chemotherapy and stays on Zofran as needed. Denies any recent rectal bleeding, no melena. No dysphagia.   Current Outpatient Prescriptions  Medication Sig Dispense Refill  . albuterol (PROVENTIL HFA;VENTOLIN HFA) 108 (90 BASE) MCG/ACT  inhaler Inhale 2 puffs into the lungs every 2 (two) hours as needed for wheezing or shortness of breath (cough). (Patient taking differently: Inhale 1 puff into the lungs every 6 (six) hours as needed for wheezing or shortness of breath (cough). ) 1 Inhaler 0  . ALPRAZolam (XANAX) 0.25 MG tablet Take 0.25 mg by mouth 3 (three) times daily as needed for anxiety.     . citalopram (CELEXA) 20 MG tablet Take 20 mg by mouth daily.    Marland Kitchen dexlansoprazole (DEXILANT) 60 MG capsule Take 60 mg by mouth daily.    . fexofenadine-pseudoephedrine (ALLEGRA-D) 60-120 MG per tablet Take 1 tablet by mouth every 12 (twelve) hours. 30 tablet 0  . ondansetron (ZOFRAN) 8 MG tablet TAKE 1 TABLET BY MOUTH EVERY 8 HOURS AS NEEDED FOR NAUSEA. 30 tablet 2  . traMADol (ULTRAM) 50 MG tablet TAKE (1) TABLET BY MOUTH EVERY (6) HOURS. (Patient taking differently: Prn) 30 tablet 1  . traZODone (DESYREL) 50 MG tablet Take 50 mg by mouth at bedtime.     No current facility-administered medications for this visit.    Allergies as of 06/05/2015 - Review Complete 06/05/2015  Allergen Reaction Noted  . Codeine Rash 08/25/2011    Past Medical History  Diagnosis Date  . Asthma   . Uterine fibroid   . Cancer     endometrial  . COPD (chronic obstructive pulmonary disease)     no definite diagnosis  . Anemia   . Vaginal Pap smear, abnormal   . Indigestion   . Diverticulitis   . Vaginal dryness 05/16/2015  . Dyspareunia 05/16/2015    Past Surgical History  Procedure Laterality Date  . Tubal ligation    . Appendectomy    .  Abdominal hysterectomy    . Portacath placement Right 09/2014  . Colonoscopy with propofol N/A 03/14/2015    RMR: Internal hemorrhoids. colonic diverticulosis. Incomplete examination. Prepartation inadequate.  . Esophagogastroduodenoscopy (egd) with propofol N/A 03/14/2015    RMR: Mild erosive reflux esophagitis status post passage o f a Maloney dilator. Abnormal gastric mucosa of uncertain significance as  described above. status post biopsy.   . Esophageal dilation N/A 03/14/2015    Procedure: ESOPHAGEAL DILATION;  Surgeon: Daneil Dolin, MD;  Location: [facility];  Service: Endoscopy;  Laterality: N/A;  Maloney 46  . Esophageal biopsy N/A 03/14/2015    Procedure: BIOPSY;  Surgeon: Daneil Dolin, MD;  Location: [facility];  Service: Endoscopy;  Laterality: N/A;  Gastric    Family History  Problem Relation Age of Onset  . Other Mother     clot that went to heart, deceased age 9ss  . Heart attack Father     age 70s, deceased  . Stroke Father   . Cancer Sister     kidney  . Hypertension Sister   . Diabetes Brother   . Hypertension Brother   . Endometriosis Daughter   . Heart attack Maternal Grandfather   . Diabetes Sister   . Colon cancer Neg Hx     Social History   Social History  . Marital Status: Single    Spouse Name: N/A  . Number of Children: 2  . Years of Education: N/A   Occupational History  . Not on file.   Social History Main Topics  . Smoking status: Current Every Day Smoker -- 0.50 packs/day for 25 years    Types: Cigarettes  . Smokeless tobacco: Never Used     Comment: smokes 1 pack per week  . Alcohol Use: Yes     Comment: once a year  . Drug Use: No  . Sexual Activity: Yes    Birth Control/ Protection: Surgical     Comment: hyst   Other Topics Concern  . Not on file   Social History Narrative      ROS:  General: Negative for anorexia, weight loss, fever, chills, fatigue, weakness. Eyes: Negative for vision changes.  ENT: Negative for hoarseness, difficulty swallowing , nasal congestion. CV: Negative for chest pain, angina, palpitations, dyspnea on exertion, peripheral edema.  Respiratory: Negative for dyspnea at rest, dyspnea on exertion, cough, sputum, wheezing.  GI: See history of present illness. GU:  Negative for dysuria, hematuria, urinary incontinence, urinary frequency, nocturnal urination.  MS: Negative for joint pain, low back pain.   Derm: Negative for rash or itching.  Neuro: Negative for weakness, abnormal sensation, seizure, frequent headaches, memory loss, confusion.  Psych: Negative for anxiety, depression, suicidal ideation, hallucinations.  Endo: Negative for unusual weight change.  Heme: Negative for bruising or bleeding. Allergy: Negative for rash or hives.    Physical Examination:  BP 107/74 mmHg  Pulse 81  Temp(Src) 98.1 F (36.7 C)  Ht 5\' 3"  (1.6 m)  Wt 152 lb 9.6 oz (69.219 kg)  BMI 27.04 kg/m2   General: Well-nourished, well-developed in no acute distress.  Head: Normocephalic, atraumatic.   Eyes: Conjunctiva pink, no icterus. Mouth: Oropharyngeal mucosa moist and pink , no lesions erythema or exudate. Neck: Supple without thyromegaly, masses, or lymphadenopathy.  Lungs: Clear to auscultation bilaterally.  Heart: Regular rate and rhythm, no murmurs rubs or gallops.  Abdomen: Bowel sounds are normal, nontender, nondistended, no hepatosplenomegaly or masses, no abdominal bruits or    hernia ,  no rebound or guarding.   Rectal: *not performed Extremities: No lower extremity edema. No clubbing or deformities.  Neuro: Alert and oriented x 4 , grossly normal neurologically.  Skin: Warm and dry, no rash or jaundice.   Psych: Alert and cooperative, normal mood and affect.  Labs: Lab Results  Component Value Date   WBC 6.0 02/08/2015   HGB 11.8* 02/08/2015   HCT 35.0* 02/08/2015   MCV 99.4 02/08/2015   PLT 260 02/08/2015   Lab Results  Component Value Date   CREATININE 0.71 02/08/2015   BUN 13 02/08/2015   NA 140 02/08/2015   K 4.3 02/08/2015   CL 105 02/08/2015   CO2 27 02/08/2015   Lab Results  Component Value Date   ALT 39 02/08/2015   AST 24 02/08/2015   ALKPHOS 76 02/08/2015   BILITOT 0.6 02/08/2015     Imaging Studies:   CLINICAL DATA: Restaging endometrial carcinoma status post 3 cycles of chemotherapy, last administered 3 weeks ago. Initial diagnosis 07/30/2014 with  subsequent hysterectomy. Subsequent encounter.  EXAM: CT CHEST, ABDOMEN, AND PELVIS WITH CONTRAST  TECHNIQUE: Multidetector CT imaging of the chest, abdomen and pelvis was performed following the standard protocol during bolus administration of intravenous contrast.  CONTRAST: 178mL OMNIPAQUE IOHEXOL 300 MG/ML SOLN  COMPARISON: Abdominal pelvic CT 08/31/2007. Pelvic ultrasound 02/15/2014.  FINDINGS: CT CHEST FINDINGS  Mediastinum/Nodes: There are no enlarged mediastinal, hilar or axillary lymph nodes. The thyroid gland, trachea and esophagus demonstrate no significant findings. The heart size is normal. There is no pericardial effusion.Right IJ Port-A-Cath tip is in the upper right atrium. There are no significant vascular findings.  Lungs/Pleura: There is no pleural effusion.5 mm subpleural nodule anteriorly in the right middle lobe on image number 3 T8 was not clearly imaged on the prior abdominal CT and is probably a lymph node. No suspicious pulmonary nodules or confluent airspace opacities are demonstrated.  Musculoskeletal/Chest wall: No evidence of chest wall mass or suspicious osseous finding.  CT ABDOMEN AND PELVIS FINDINGS  Hepatobiliary: The liver is normal in density without focal abnormality. No evidence of gallstones, gallbladder wall thickening or biliary dilatation.  Pancreas: Unremarkable. No pancreatic ductal dilatation or surrounding inflammatory changes.  Spleen: Normal in size without focal abnormality.  Adrenals/Urinary Tract: Both adrenal glands appear normal.The kidneys appear normal without evidence of urinary tract calculus, suspicious lesion or hydronephrosis. No bladder abnormalities are seen.  Stomach/Bowel: There is mild sigmoid colon wall thickening and diverticulosis without surrounding inflammatory change. There is mildly prominent stool throughout the colon. No small bowel wall thickening, bowel distension or  extraluminal fluid collection demonstrated.  Vascular/Lymphatic: 7 mm left periaortic node on image 72 and 8 mm left common iliac node on image number 80 are unchanged from 2008, consistent with a benign etiology. No new or enlarging abdominal pelvic lymph nodes identified. Mild aortoiliac atherosclerosis noted.  Reproductive: Status post interval total hysterectomy. No evidence of adnexal mass, pelvic inflammatory process or fluid collection.  Other: Postsurgical changes within the infraumbilical anterior abdominal wall. No evidence of abdominal wall mass or hernia.  Musculoskeletal: No acute or significant osseous findings. Degenerative disc disease noted at L5-S1.  IMPRESSION: 1. Status post total hysterectomy. No evidence of residual pelvic mass. 2. Stable mildly prominent retroperitoneal lymph nodes from 2008. No evidence of metastatic disease. 3. 5 mm subpleural right middle lobe nodule, likely a benign incidental finding. Clinic notes indicate outside imaging demonstrating pulmonary nodules suspicious for metastatic disease. Direct comparison with outside imaging and/or continued  follow up suggested. 4. Mild sigmoid diverticulosis.   Electronically Signed  By: Richardean Sale M.D.  On: 11/20/2014 09:41

## 2015-07-04 NOTE — Anesthesia Procedure Notes (Signed)
Procedure Name: MAC Date/Time: 07/04/2015 10:50 AM Performed by: [facility] Deck Pre-anesthesia Checklist: Patient identified, Emergency Drugs available, Suction available, Timeout performed and Patient being monitored Patient Re-evaluated:Patient Re-evaluated prior to inductionOxygen Delivery Method: Non-rebreather mask

## 2015-07-04 NOTE — Op Note (Signed)
Julia Osborne, [id number]   1COLONOSCOPY PROCEDURE REPORT  PATIENT: Julia Osborne, [place]  MR#: [id number] BIRTHDATE: May 13, 1961 , 89  yrs. old GENDER: female ENDOSCOPIST: R.  Garfield Cornea, MD FACP FACG REFERRED NV:[facility] Ancil Linsey, MD PROCEDURE DATE:  07/27/2015 PROCEDURE:   Colonoscopy with saline assisted,  piecemeal snare polypectomy and APC ablationand lesion tattooing INDICATIONS:Hematochezia; abnormal sigmoid segment on CT. MEDICATIONS: Deep sedation per Dr.  [facility] ASA CLASS:       Class III  CONSENT: The risks, benefits, alternatives and imponderables including but not limited to bleeding, perforation as well as the possibility of a missed lesion have been reviewed.  The potential for biopsy, lesion removal, etc. have also been discussed. Questions have been answered.  All parties agreeable.  Please see the history and physical in the medical record for more information.  DESCRIPTION OF PROCEDURE:   After the risks benefits and alternatives of the procedure were thoroughly explained, informed consent was obtained.  The digital rectal exam revealed no abnormalities of the rectum.   The     endoscope was introduced through the anus and advanced to the cecum, which was identified by both the appendix and ileocecal valve. No adverse events experienced.   The quality of the prep was adequate  The instrument was then slowly withdrawn as the colon was fully examined. Estimated blood loss is zero unless otherwise noted in this procedure report.      COLON FINDINGS: Prominent anal papilla and internal hemorrhoids; otherwise, normal-appearing rectal mucosa.  Scattered left-sided diverticula along with (1) diminutive polyp in the sigmoid segment)..  Redundant colon.  In the vicinity of the hepatic flexure, there was a 4.5 x 2.5 cm sprawling polypoid lesion. Please see photos.  The remainder of the  colonic because to the cecum appeared normal.  Attention was turned to the polypoid lesion in the more proximal colon.  It was lifted away from the mucosa nicely with approximately 13 mL of normal saline injected submucosally at the periphery.  Subsequently, a  piecemeal snare polypectomy ensued with recovery of multiple polyp fragments.  Most of this lesion was felt to  removed with possibly leaving a small amount of tissue at the periphery of the polypectomy site.  The periphery of this lesion was ablated with APC unit using a circular probe at 20 J/right colon settings. These maneuvers were done without complication.  Finally, this polypectomy site was tattooed approximately 2 cm proximal to the proximal extent and 2 cm distal to the distal extent of this lesion.  The sigmoid polyp was cold snare removed.  Retroflexion was performed. .  Withdrawal time=52 minutes 0 seconds.  The scope was withdrawn and the procedure completed. COMPLICATIONS: There were no immediate complications. EBL 3 mL ENDOSCOPIC IMPRESSION: Colonic diverticulosis. Large polypoid lesion in the vicinity of the hepatic flexure?"status post saline-assisted piecemeal snare polypectomy with ablation and tattooing as described. Sigmoid polyp?"removed as described above.  RECOMMENDATIONS: No aspirin or NSAIDs -   10 days.  Rectal bleeding likely from hemorrhoids. Depending on path, patient will need an early interval follow-up colonoscopy at a minimum.  eSigned:  R. Garfield Cornea, MD [facility] 2015-07-27 12:17 PM   cc:  CPT CODES: ICD CODES:  The ICD and CPT codes recommended by this software are interpretations from the data that the clinical staff has captured with the software.  The verification of the translation of this report to the ICD and  CPT codes and modifiers is the sole responsibility of the health care institution and practicing physician where this report was generated.  Train[employer]. will not be held responsible for the validity of the ICD and CPT codes included on this report.  AMA assumes no liability for data contained or not contained herein. CPT is a Designer, television/film set of the [employer].  PATIENT NAME:  Marielis, Julia Osborne MR#: [id number]

## 2015-07-05 ENCOUNTER — Encounter (HOSPITAL_COMMUNITY): Payer: Self-pay | Admitting: Internal Medicine

## 2015-07-08 ENCOUNTER — Other Ambulatory Visit: Payer: Self-pay

## 2015-07-08 DIAGNOSIS — R933 Abnormal findings on diagnostic imaging of other parts of digestive tract: Secondary | ICD-10-CM

## 2015-07-08 DIAGNOSIS — K921 Melena: Secondary | ICD-10-CM

## 2015-07-16 ENCOUNTER — Encounter: Payer: Self-pay | Admitting: Adult Health

## 2015-07-16 ENCOUNTER — Ambulatory Visit (INDEPENDENT_AMBULATORY_CARE_PROVIDER_SITE_OTHER): Payer: Medicaid Other | Admitting: Adult Health

## 2015-07-16 VITALS — BP 122/60 | HR 72 | Ht 63.0 in | Wt 154.0 lb

## 2015-07-16 DIAGNOSIS — N898 Other specified noninflammatory disorders of vagina: Secondary | ICD-10-CM

## 2015-07-16 DIAGNOSIS — L298 Other pruritus: Secondary | ICD-10-CM

## 2015-07-16 HISTORY — DX: Other specified noninflammatory disorders of vagina: N89.8

## 2015-07-16 NOTE — Patient Instructions (Signed)
Try luvena every 72 hours Follow up prn

## 2015-07-16 NOTE — Progress Notes (Signed)
Subjective:     Patient ID: Julia Osborne, female   DOB: 1961-04-03, 54 y.o.   MRN: [id number]  HPI Julia Osborne is a 55 year old white female who has had hysterectomy due to endometrial cancer with chemo,in today complaining of vaginal itching.She had colonoscopy 10/13 and found to have colon cancer, has appt 11/3 with Dr Arnoldo Morale to scheduled surgery.  Review of Systems + vaginal itching,all other systems negative Reviewed past medical,surgical, social and family history. Reviewed medications and allergies.     Objective:   Physical Exam BP 122/60 mmHg  Pulse 72  Ht 5\' 3"  (1.6 m)  Wt 154 lb (69.854 kg)  BMI 27.29 kg/m2 Skin warm and dry.Pelvic: external genitalia is normal in appearance no lesions, vagina:has loss of color, moisture and ruage,some strawberry like appearance,urethra has no lesions or masses noted, cervix and uterus are absent, adnexa: no masses or tenderness noted. Bladder is non tender and no masses felt    Assessment:     Vaginal itching Vaginal dryness     Plan:     Will try luvena every 72 hours, samples given,if no help will try estrogen vaginal cream(have OK'd with Dr Whitney Muse) Follow up prn

## 2015-07-22 ENCOUNTER — Ambulatory Visit (HOSPITAL_COMMUNITY)
Admission: RE | Admit: 2015-07-22 | Discharge: 2015-07-22 | Disposition: A | Discharge status: Home / Self Care | Payer: Medicaid Other | Source: Ambulatory Visit | Attending: Hematology & Oncology | Admitting: Hematology & Oncology

## 2015-07-22 DIAGNOSIS — C541 Malignant neoplasm of endometrium: Secondary | ICD-10-CM | POA: Diagnosis not present

## 2015-07-22 DIAGNOSIS — Z90710 Acquired absence of both cervix and uterus: Secondary | ICD-10-CM | POA: Insufficient documentation

## 2015-07-22 DIAGNOSIS — T451X5A Adverse effect of antineoplastic and immunosuppressive drugs, initial encounter: Secondary | ICD-10-CM

## 2015-07-22 DIAGNOSIS — T451X5S Adverse effect of antineoplastic and immunosuppressive drugs, sequela: Secondary | ICD-10-CM | POA: Diagnosis not present

## 2015-07-22 DIAGNOSIS — Z9221 Personal history of antineoplastic chemotherapy: Secondary | ICD-10-CM | POA: Diagnosis not present

## 2015-07-22 DIAGNOSIS — Z08 Encounter for follow-up examination after completed treatment for malignant neoplasm: Secondary | ICD-10-CM | POA: Insufficient documentation

## 2015-07-22 DIAGNOSIS — G620 Drug-induced polyneuropathy: Secondary | ICD-10-CM | POA: Diagnosis not present

## 2015-07-22 MED ORDER — IOHEXOL 300 MG/ML  SOLN
100.0000 mL | Freq: Once | INTRAMUSCULAR | Status: AC | PRN
  Administered 2015-07-22: 100 mL via INTRAVENOUS

## 2015-07-26 ENCOUNTER — Encounter (HOSPITAL_COMMUNITY): Payer: Medicaid Other | Attending: Hematology & Oncology | Admitting: Hematology & Oncology

## 2015-07-26 ENCOUNTER — Encounter (HOSPITAL_COMMUNITY): Payer: Self-pay | Admitting: Hematology & Oncology

## 2015-07-26 VITALS — BP 125/65 | HR 73 | Temp 98.5°F | Resp 18 | Wt 153.9 lb

## 2015-07-26 DIAGNOSIS — Z720 Tobacco use: Secondary | ICD-10-CM | POA: Diagnosis not present

## 2015-07-26 DIAGNOSIS — G622 Polyneuropathy due to other toxic agents: Secondary | ICD-10-CM | POA: Diagnosis not present

## 2015-07-26 DIAGNOSIS — D369 Benign neoplasm, unspecified site: Secondary | ICD-10-CM | POA: Diagnosis not present

## 2015-07-26 DIAGNOSIS — C541 Malignant neoplasm of endometrium: Secondary | ICD-10-CM | POA: Diagnosis present

## 2015-07-26 NOTE — Patient Instructions (Signed)
..  [place] at [facility] Discharge Instructions  RECOMMENDATIONS MADE BY THE CONSULTANT AND ANY TEST RESULTS WILL BE SENT TO YOUR REFERRING PHYSICIAN.  Exam per Dr. Youlanda Roys can take one gabapentin daily if that works for you  Labs in 3 months  We will put you on the genetics schedule  Thank you for choosing [place] at [facility] to provide your oncology and hematology care.  To afford each patient quality time with our provider, please arrive at least 15 minutes before your scheduled appointment time.    You need to re-schedule your appointment should you arrive 10 or more minutes late.  We strive to give you quality time with our providers, and arriving late affects you and other patients whose appointments are after yours.  Also, if you no show three or more times for appointments you may be dismissed from the clinic at the providers discretion.     Again, thank you for choosing [facility].  Our hope is that these requests will decrease the amount of time that you wait before being seen by our physicians.       _____________________________________________________________  Should you have questions after your visit to [facility], please contact our office at [phone] between the hours of 8:30 a.m. and 4:30 p.m.  Voicemails left after 4:30 p.m. will not be returned until the following business day.  For prescription refill requests, have your pharmacy contact our office.

## 2015-07-26 NOTE — Progress Notes (Signed)
[place] PROGRESS NOTE  CHIEF COMPLAINTS/PURPOSE OF CONSULTATION:  Stage IV Endometrial Cancer, probable pulmonary metastases. Radical hysterectomy, BSO, pelvic LN dissection and vaginal biopsies 07/31/2014, patient had extensive pelvic disease including positive LN and positive distal vaginal biopsy  CA-125 137.41 on 07/19/2014 CA-125 25.72 on 09/27/2014  (normal range 0-20.9U/ml)  tubular adenoma with focal high grade dysplasia on C-scope 07/04/2015  HISTORY OF PRESENTING ILLNESS:   Julia Osborne 54 y.o. female is here because of stage IV endometrial cancer. She has completed all therapy. She is here to review her scans. She had a screening C-scope that showed a large polypoid lesion in the vicinity of the hepatic flexure. Pathology showed a tubular adenoma with focal high grade dysplasia.  Julia Osborne is here alone today. She states that she feels good aside from the neuropathy.  Julia Osborne is here alone today.   She is having her surgery on December 9th with Dr. Arnoldo Morale. She states that he was very straightforward with her, remarking that the surgery shouldn't be so bad, and that her recovery will likely be 4 days. He consulted with her about how the nature of the surgery is more precancerous and preventative than anything else. She says "I just told him whaever you do, don't give me that dilaudid stuff."  She confirms negative symptoms when she upped her gabapentin dose, remarking that it was "like being on a quaalude or something." She says it "scared the hell out of her" because her vision went double while she was driving home. In general, when it comes to pills, she states that it doesn't take much to give her an effect in general.  Julia Osborne comments that she's a nervous wreck about the surgery, but is trying to focus on other things to distract herself. 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 She adds that she is most worried about leaving her dog when it comes to her surgery.  With regards to smoking, she remarks that "one pack lasts her almost two days," but states that she's already set a goal with this surgery. She remarks that she almost quit after her last surgery, and didn't want a single cigarette while she was in the hospital. The only reason she started back smoking was because she was staying with her family. This time, she plans to completely quit smoking after her surgery.  She states that she only experiences belly pain when she's constipated or feeling gas. She uses linzess every day and states with some amused attitude "that clears me up."  She has had her flu shot.  The patient denies abdominal or flank pain, anorexia, nausea or vomiting, dysphagia, change in bowel habits or black or bloody stools or weight loss. Patient denies any exertional chest pain, dyspnea, palpitations, syncope, orthopnea, edema or paroxysmal nocturnal dyspnea.     Endometrial cancer ([place])   07/12/2014 Imaging CT C/A/P, pelvic adenopathy, multiple pulmonary nodules concerning for metastatic disease   07/30/2014 Initial Diagnosis Endometrial cancer   07/31/2014 Pathology Results endometrioid adenocarcinoma, G2, lymphovascular invasion present, positive pelvic lymph node and vaginal biopsy   07/31/2014 Definitive Surgery radical hysterectomy, BSO, pelvic lymph node dissection and vaginal biopsies   09/25/2014 Procedure Port-A-Cath placement in IR   10/18/2014 - 01/10/2015 Chemotherapy Carboplatin/Taxol. First cycle given at [facility].  S/P 6 cycles total, 5 cycles given at [facility].     MEDICAL HISTORY:  Past Medical History  Diagnosis Date  . Asthma   . Uterine fibroid   . COPD (chronic obstructive pulmonary disease) ([place])     no definite diagnosis  . Anemia   . Vaginal Pap smear, abnormal   . Indigestion   . Diverticulitis   . Vaginal dryness 05/16/2015  . Dyspareunia 05/16/2015  .  GERD (gastroesophageal reflux disease)   . Anxiety   . Depression   . Neuropathy ([facility])     feet and hands  . Cancer ([place])     endometrial; cancer cells in intestine  . Vaginal itching 07/16/2015    SURGICAL HISTORY: Past Surgical History  Procedure Laterality Date  . Tubal ligation    . Appendectomy    . Abdominal hysterectomy    . Portacath placement Right 09/2014  . Colonoscopy with propofol N/A 03/14/2015    RMR: Internal hemorrhoids. colonic diverticulosis. Incomplete examination. Prepartation inadequate.  . Esophagogastroduodenoscopy (egd) with propofol N/A 03/14/2015    RMR: Mild erosive reflux esophagitis status post passage o f a Maloney dilator. Abnormal gastric mucosa of uncertain significance as described above. status post biopsy, benign  . Esophageal dilation N/A 03/14/2015    Procedure: ESOPHAGEAL DILATION;  Surgeon: Daneil Dolin, MD;  Location: [facility];  Service: Endoscopy;  Laterality: N/A;  Maloney 35  . Esophageal biopsy N/A 03/14/2015    Procedure: BIOPSY;  Surgeon: Daneil Dolin, MD;  Location: [facility];  Service: Endoscopy;  Laterality: N/A;  Gastric  . Colonoscopy with propofol N/A 07/04/2015    Procedure: COLONOSCOPY WITH PROPOFOL; IN CECUM AT 1108; WITHDRAWAL TIME 52 MINUTES;  Surgeon: Daneil Dolin, MD;  Location: [facility];  Service: Endoscopy;  Laterality: N/A;  . Polypectomy N/A 07/04/2015    Procedure: POLYPECTOMY;  Surgeon: Daneil Dolin, MD;  Location: [facility];  Service: Endoscopy;  Laterality: N/A;    SOCIAL HISTORY: Social History   Social History  . Marital Status: Single    Spouse Name: N/A  . Number of Children: 2  . Years of Education: N/A   Occupational History  . Not on file.   Social History Main Topics  . Smoking status: Current Every Day Smoker -- 0.50 packs/day for 25 years    Types: Cigarettes  . Smokeless tobacco: Never Used  . Alcohol Use: Yes     Comment: once a year  . Drug Use: No  . Sexual Activity: Not Currently    Birth  Control/ Protection: Surgical     Comment: hyst   Other Topics Concern  . Not on file   Social History Narrative  She was working, but now has applied for SSI and disability. She is now living with her niece. 2 children. 3 grandchildren, one is deceased. Divorced. Smoker 1/2 ppd. Trying to cut back.  Rare alcohol use.   FAMILY HISTORY: Family History  Problem Relation Age of Onset  . Other Mother     clot that went to heart, deceased age 80ss  . Heart attack Father     age 26s, deceased  . Stroke Father   . Cancer Sister     kidney  . Hypertension Sister   . Diabetes Brother   . Hypertension Brother   . Endometriosis Daughter   . Heart attack Maternal Grandfather   . Diabetes Sister   . Colon cancer Neg Hx    indicated that her mother is deceased. She indicated that her father is deceased. She indicated that both of her sisters are alive. She indicated that both  of her brothers are alive. She indicated that her maternal grandmother is deceased. She indicated that her maternal grandfather is deceased. She indicated that her paternal grandmother is deceased. She indicated that her paternal grandfather is deceased. She indicated that her daughter is alive. She indicated that her son is alive.    Mother deceased at 45 from embolus Father deceased at 22 from MI 2 sisters and 2 brothers, Oldest sister had nephrectomy for RCC.   ALLERGIES:  is allergic to codeine.  MEDICATIONS:  Current Outpatient Prescriptions  Medication Sig Dispense Refill  . albuterol (PROVENTIL HFA;VENTOLIN HFA) 108 (90 BASE) MCG/ACT inhaler Inhale 2 puffs into the lungs every 2 (two) hours as needed for wheezing or shortness of breath (cough). (Patient taking differently: Inhale 1 puff into the lungs every 6 (six) hours as needed for wheezing or shortness of breath (cough). ) 1 Inhaler 0  . ALPRAZolam (XANAX) 0.25 MG tablet Take 0.25 mg by mouth 3 (three) times daily as needed for anxiety.     . citalopram  (CELEXA) 20 MG tablet Take 20 mg by mouth daily.    Marland Kitchen dexlansoprazole (DEXILANT) 60 MG capsule Take 60 mg by mouth daily.    . fexofenadine-pseudoephedrine (ALLEGRA-D) 60-120 MG per tablet Take 1 tablet by mouth every 12 (twelve) hours. 30 tablet 0  . Linaclotide (LINZESS) 145 MCG CAPS capsule Take 1 capsule (145 mcg total) by mouth daily before breakfast. 30 capsule 5  . ondansetron (ZOFRAN) 8 MG tablet TAKE 1 TABLET BY MOUTH EVERY 8 HOURS AS NEEDED FOR NAUSEA. 30 tablet 0  . traMADol (ULTRAM) 50 MG tablet TAKE (1) TABLET BY MOUTH EVERY (6) HOURS AS NEEDED. 30 tablet 2  . traZODone (DESYREL) 50 MG tablet Take 50 mg by mouth at bedtime.    . gabapentin (NEURONTIN) 300 MG capsule Take one tablet at night for 5 days, then 2 at night for 5 days, then one in am, increase slowly to total of 1800 mg daily (Patient not taking: Reported on 07/16/2015) 180 capsule 3  . omeprazole (PRILOSEC) 20 MG capsule Take 20 mg by mouth daily as needed.      No current facility-administered medications for this visit.    Review of Systems  Constitutional: Negative for fever, chills, weight loss and malaise/fatigue.  HENT: Negative for congestion, hearing loss, nosebleeds, sore throat and tinnitus.   Eyes: Negative for blurred vision, double vision, pain and discharge.  Respiratory: Negative for cough, hemoptysis, sputum production, shortness of breath and wheezing.   Cardiovascular: Negative for chest pain, palpitations, claudication, leg swelling and PND.  Gastrointestinal: Negative for heartburn, abdominal pain, diarrhea, constipation, blood in stool and melena. Positive for nausea Genitourinary: Negative for dysuria, urgency, frequency and hematuria.  Musculoskeletal: Negative for myalgias, joint pain and falls.  Skin: Negative for itching and rash.  Neurological: Negative for dizziness, tingling, tremors, sensory change, speech change, focal weakness, seizures, loss of consciousness, weakness and headaches.   Endo/Heme/Allergies: Does not bruise/bleed easily.  Psychiatric/Behavioral: Negative for depression, suicidal ideas, memory loss and substance abuse. The patient is not nervous/anxious and does not have insomnia.    14 point review of systems was performed and is negative except as detailed under history of present illness and above   PHYSICAL EXAMINATION:  ECOG PERFORMANCE STATUS: 0 - Asymptomatic  Filed Vitals:   07/26/15 0813  BP: 125/65  Pulse: 73  Temp: 98.5 F (36.9 C)  Resp: 18   Filed Weights   07/26/15 0813  Weight: 153 lb  14.4 oz (69.809 kg)   Physical Exam  Constitutional: She is oriented to person, place, and time and well-developed, well-nourished, and in no distress. hair regrowth noted  Well groomed HENT:  Head: Normocephalic and atraumatic.  Nose: Nose normal.  Mouth/Throat: Oropharynx is clear and moist. No oropharyngeal exudate.  Eyes: Conjunctivae and EOM are normal. Pupils are equal, round, and reactive to light. Right eye exhibits no discharge. Left eye exhibits no discharge. No scleral icterus.  Neck: Normal range of motion. Neck supple. No tracheal deviation present. No thyromegaly present.  Cardiovascular: Normal rate, regular rhythm and normal heart sounds.  Exam reveals no gallop and no friction rub.   No murmur heard. Pulmonary/Chest: Effort normal and breath sounds normal. She has no wheezes. She has no rales.  Abdominal: Soft. Bowel sounds are normal. She exhibits no distension and no mass. There is no tenderness. There is no rebound and no guarding.  Musculoskeletal: Normal range of motion. She exhibits no edema.  Lymphadenopathy:    She has no cervical adenopathy.  Neurological: She is alert and oriented to person, place, and time. She has normal reflexes. No cranial nerve deficit. Gait normal. Coordination normal.  Skin: Skin is warm and dry. No rash noted.  Psychiatric: Mood, memory, affect and judgment normal.  Nursing note and vitals  reviewed.  LABORATORY DATA:  I have reviewed the data as listed Lab Results  Component Value Date   WBC 5.4 07/01/2015   HGB 12.3 07/01/2015   HCT 36.0 07/01/2015   MCV 91.8 07/01/2015   PLT 208 07/01/2015     Chemistry      Component Value Date/Time   NA 136 07/01/2015 1111   K 3.8 07/01/2015 1111   CL 104 07/01/2015 1111   CO2 25 07/01/2015 1111   BUN 13 07/01/2015 1111   CREATININE 0.94 07/01/2015 1111      Component Value Date/Time   CALCIUM 8.3* 07/01/2015 1111   ALKPHOS 57 06/28/2015 1139   AST 17 06/28/2015 1139   ALT 16 06/28/2015 1139   BILITOT 0.4 06/28/2015 1139     RADIOLOGY: I have reviewed the images listed below and agree with the results  Study Result     CLINICAL DATA: Restaging stage IV endometrial cancer. Abnormal colonoscopy last week.  EXAM: CT CHEST, ABDOMEN, AND PELVIS WITH CONTRAST  TECHNIQUE: Multidetector CT imaging of the chest, abdomen and pelvis was performed following the standard protocol during bolus administration of intravenous contrast.  CONTRAST: 122mL OMNIPAQUE IOHEXOL 300 MG/ML SOLN  COMPARISON: 02/26/2015.  FINDINGS: CT CHEST FINDINGS  Mediastinum/Nodes: Right IJ Port-A-Cath terminates in the right atrium. No pathologically enlarged mediastinal, hilar or axillary lymph nodes. Heart size normal. No pericardial effusion.  Lungs/Pleura: Lungs are clear. No pleural fluid. Airway is unremarkable.  Musculoskeletal: No worrisome lytic or sclerotic lesions.  CT ABDOMEN PELVIS FINDINGS  Hepatobiliary: Liver and gallbladder are unremarkable. No biliary ductal dilatation.  Pancreas: Negative.  Spleen: Negative.  Adrenals/Urinary Tract: Adrenal glands and kidneys are unremarkable. Ureters are decompressed. Bladder is unremarkable.  Stomach/Bowel: Stomach, small bowel and colon are unremarkable. Appendectomy. Note is made of stool is seen throughout the colon, indicative of  constipation.  Vascular/Lymphatic: Atherosclerotic calcification of the arterial vasculature without abdominal aortic aneurysm. No pathologically enlarged lymph nodes.  Reproductive: Hysterectomy. Ovaries are not well-visualized.  Other: No free fluid. There may be a tiny left inguinal hernia containing fat. Mesenteries and peritoneum are unremarkable.  Musculoskeletal: No worrisome lytic or sclerotic lesions. Mild degenerative changes are  seen in the spine.  IMPRESSION: No evidence of metastatic disease.   Electronically Signed  By: Lorin Picket M.D.  On: 07/22/2015 12:33    Study Result     CLINICAL DATA: Restaging stage IV endometrial cancer. Abnormal colonoscopy last week.  EXAM: CT CHEST, ABDOMEN, AND PELVIS WITH CONTRAST  TECHNIQUE: Multidetector CT imaging of the chest, abdomen and pelvis was performed following the standard protocol during bolus administration of intravenous contrast.  CONTRAST: 163mL OMNIPAQUE IOHEXOL 300 MG/ML SOLN  COMPARISON: 02/26/2015.  FINDINGS: CT CHEST FINDINGS  Mediastinum/Nodes: Right IJ Port-A-Cath terminates in the right atrium. No pathologically enlarged mediastinal, hilar or axillary lymph nodes. Heart size normal. No pericardial effusion.  Lungs/Pleura: Lungs are clear. No pleural fluid. Airway is unremarkable.  Musculoskeletal: No worrisome lytic or sclerotic lesions.  CT ABDOMEN PELVIS FINDINGS  Hepatobiliary: Liver and gallbladder are unremarkable. No biliary ductal dilatation.  Pancreas: Negative.  Spleen: Negative.  Adrenals/Urinary Tract: Adrenal glands and kidneys are unremarkable. Ureters are decompressed. Bladder is unremarkable.  Stomach/Bowel: Stomach, small bowel and colon are unremarkable. Appendectomy. Note is made of stool is seen throughout the colon, indicative of constipation.  Vascular/Lymphatic: Atherosclerotic calcification of the arterial vasculature  without abdominal aortic aneurysm. No pathologically enlarged lymph nodes.  Reproductive: Hysterectomy. Ovaries are not well-visualized.  Other: No free fluid. There may be a tiny left inguinal hernia containing fat. Mesenteries and peritoneum are unremarkable.  Musculoskeletal: No worrisome lytic or sclerotic lesions. Mild degenerative changes are seen in the spine.  IMPRESSION: No evidence of metastatic disease.   Electronically Signed  By: Lorin Picket M.D.  On: 07/22/2015 12:33    EXAM: DIGITAL SCREENING BILATERAL MAMMOGRAM WITH 3D TOMO WITH CAD  COMPARISON: None.  ACR Breast Density Category b: There are scattered areas of fibroglandular density.  FINDINGS: There are no findings suspicious for malignancy. Images were processed with CAD.  IMPRESSION: No mammographic evidence of malignancy. A result letter of this screening mammogram will be mailed directly to the patient.  RECOMMENDATION: Screening mammogram in one year. (Code:SM-B-01Y)  BI-RADS CATEGORY 1: Negative.   Electronically Signed  By: Nolon Nations M.D.  On: 02/25/2015 17:08    ASSESSMENT & PLAN:  Stage IV Endometrial Carcinoma diagnosed at [place] Abuse Nausea Chemotherapy induced neuropathy tubular adenoma with focal high grade dysplasia on C-scope 07/04/2015   She is doing well. I reviewed her imaging with her. She will proceed with surgery with Dr. Arnoldo Morale as planned. I am referring her to genetics given her history of endometrial cancer and now her diagnosis of tubular adenoma with high grade dysplasia.  I advised her to take one neurontin daily if she feels it helps her neuropathy and she can tolerate it.  She does not need any refills. She states that the only thing she might need a refill on is tramadol, but she's still got a few of those left.  I will see her back after Christmas in 3 months, and then grab scans on her 3 months later.   Orders  Placed This Encounter  Procedures  . CBC with Differential    Standing Status: Future     Number of Occurrences:      Standing Expiration Date: 07/25/2016  . Comprehensive metabolic panel    Standing Status: Future     Number of Occurrences:      Standing Expiration Date: 07/25/2016  . CA 125    Standing Status: Future     Number of Occurrences:      Standing Expiration  Date: 07/25/2016   All questions were answered. The patient knows to call the clinic with any problems, questions or concerns.  This document serves as a record of services personally performed by Ancil Linsey, MD. It was created on her behalf by Toni Amend, a trained medical scribe. The creation of this record is based on the scribe's personal observations and the provider's statements to them. This document has been checked and approved by the attending provider.  I have reviewed the above documentation for accuracy and completeness, and I agree with the above. This note was electronically signed Kelby Fam. Sophiana Milanese MD

## 2015-08-02 NOTE — H&P (Signed)
  NT[facility] SOAP Note  Vital Signs:  Vitals as of: [id number]: Systolic 0000000: Diastolic 91: Heart Rate 78: Temp 98.42F: Height 43ft 3in: Weight 152Lbs 0 Ounces: Pain Level 4: BMI 26.93  BMI : 26.93 kg/m2  Subjective: This 54 year old female presents for of a high grade dyplastic polyp around the hepatic flexure which could not be removed endoscopically.  Recently finished chemotherapy for uterine carcinoma.  Review of Symptoms:  Constitutional:fatigue headache Eyes:unremarkable   sinus problems Cardiovascular:  unremarkable Respiratory:unremarkable Gastrointestinabdominal pain, nausea Genitourinary:frequency Musculoskeletal:unremarkable Skin:unremarkable Hematolgic/Lymphatic:unremarkable   Allergic/Immunologic:unremarkable   Past Medical History:  Reviewed  Past Medical History  Surgical History: appy, radical hysterectomy Medical Problems: uterine cancer, asthma Allergies: codeine Medications: albuterol, xanax, ultram, desyrel   Social History:Reviewed  Social History  Preferred Language: English Race:  White Ethnicity: Not Hispanic / Latino Age: 54 year Marital Status:  S Alcohol: ?   Smoking Status: Current every day smoker reviewed on 07/25/2015 Started Date:  Packs per day: 0.50 Functional Status reviewed on 07/25/2015 ------------------------------------------------ Bathing: Normal Cooking: Normal Dressing: Normal Driving: Normal Eating: Normal Managing Meds: Normal Oral Care: Normal Shopping: Normal Toileting: Normal Transferring: Normal Walking: Normal Cognitive Status reviewed on 07/25/2015 ------------------------------------------------ Attention: Normal Decision Making: Normal Language: Normal Memory: Normal Motor: Normal Perception: Normal Problem Solving: Normal Visual and Spatial: Normal   Family History:Reviewed  Family Health History Mother, Deceased; Healthy;  Father, Deceased; Healthy;     Objective  Information: General:Well appearing, well nourished in no distress. Heart:RRR, no murmur or gallop.  Normal S1, S2.  No S3, S4.  Lungs:  CTA bilaterally, no wheezes, rhonchi, rales.  Breathing unlabored. Abdomen:Soft, NT/ND, no HSM, no masses.  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   Patient Education:Alternative treatments to surgery were discussed with patient (and family).  Risks and benefits  of procedure including bleeding, infection, anastomotic leak, and the possiblity of a blood transfusion were fully explained to the patient (and family) who gave informed consent. Patient/family questions were addressed.  Follow-up:Pending Surgery

## 2015-08-22 NOTE — Patient Instructions (Signed)
Julia Osborne  08/22/2015     @PREFPERIOPPHARMACY @   Your procedure is scheduled on 08/30/15.  Report to [facility] at 6:15 A.M.  Call this number if you have problems the morning of surgery:  [phone]   Remember:  Do not eat food or drink liquids after midnight.  Take these medicines the morning of surgery with A SIP OF WATER - Albuterol inhaler (Brning to hospital, as well), Xanax, Celexa, Dexilant, Allegra, Gabapentin, Linzess, Zofran   Do not wear jewelry, make-up or nail polish.  Do not wear lotions, powders, or perfumes.  You may wear deodorant.  Do not shave 48 hours prior to surgery.  Men may shave face and neck.  Do not bring valuables to the hospital.  [facility] is not responsible for any belongings or valuables.  Contacts, dentures or bridgework may not be worn into surgery.  Leave your suitcase in the car.  After surgery it may be brought to your room.  For patients admitted to the hospital, discharge time will be determined by your treatment team.  Patients discharged the day of surgery will not be allowed to drive home.    Please read over the following fact sheets that you were given. Surgical Site Infection Prevention and Anesthesia Post-op Instructions     PATIENT INSTRUCTIONS POST-ANESTHESIA  IMMEDIATELY FOLLOWING SURGERY:  Do not drive or operate machinery for the first twenty four hours after surgery.  Do not make any important decisions for twenty four hours after surgery or while taking narcotic pain medications or sedatives.  If you develop intractable nausea and vomiting or a severe headache please notify your doctor immediately.  FOLLOW-UP:  Please make an appointment with your surgeon as instructed. You do not need to follow up with anesthesia unless specifically instructed to do so.  WOUND CARE INSTRUCTIONS (if applicable):  Keep a dry clean dressing on the anesthesia/puncture wound site if there is drainage.  Once the wound has quit  draining you may leave it open to air.  Generally you should leave the bandage intact for twenty four hours unless there is drainage.  If the epidural site drains for more than 36-48 hours please call the anesthesia department.  QUESTIONS?:  Please feel free to call your physician or the hospital operator if you have any questions, and they will be happy to assist you.      Open Colectomy An open colectomy is surgery to take out part or all of the large intestine (colon). This procedure is used to treat several conditions, including:  Inflammation and infection of the colon (diverticulitis).  Tumors or masses in the colon.  Inflammatory bowel disease, such as Crohn disease or ulcerative colitis. Colectomy is an option when symptoms cannot be controlled with medicines.  Bleeding from the colon that cannot be controlled by another method.  Blockage or obstruction of the colon. LET [facility] CARE PROVIDER KNOW ABOUT:  Any allergies you have.  All medicines you are taking, including vitamins, herbs, eye drops, creams, and over-the-counter medicines.  Previous problems you or members of your family have had with the use of anesthetics.  Any blood disorders you have.  Previous surgeries you have had.  Medical conditions you have. RISKS AND COMPLICATIONS  Generally, this is a safe procedure. However, as with any procedure, complications can occur. Possible complications include:  An infection developing in the area where the surgery was done.  Problems with the incisions, including:  Bleeding from an incision.  The wound reopening.  Tissues from inside the abdomen bulging through the incision (hernia).  Bleeding inside the abdomen.  Reopening of the colon where it was stitched or stapled together. This is a serious complication. Another procedure may be needed to fix the problem.  Damage to other organs in the abdomen.  A blood clot forming in a vein and traveling to the  lungs.  Future blockage of the small intestine from scar tissue. Another surgery may be needed to repair this. BEFORE THE PROCEDURE  Various tests may be done before the procedure. These may include:  Blood tests.  A test to check the heart's rhythm (electrocardiography).  A CT scan to get pictures of your abdomen.  Ask your health care provider about changing or stopping any regular medicines. You will need to stop taking aspirin and nonsteroidal anti-inflammatory drugs (NSAIDs) at least 5 days before the surgery. You will also need to stop taking any blood thinners and vitamin E.  You may be prescribed an oral bowel prep. This involves drinking a large amount of medicated liquid, starting the day before your surgery. The liquid will cause you to have multiple loose stools until your stool is almost clear or light green. This cleans out your colon in preparation for the surgery.  Do not eat or drink anything else once you have started the bowel prep, unless your health care provider tells you it is safe to do so.  You may also be given antibiotic pills to clean out your colon of bacteria. Be sure to follow the directions carefully and take the medicine at the correct time.  Make plans to have someone drive you home after your hospital stay. Also arrange for someone to help you with activities during your recovery. PROCEDURE This surgery can take 2-4 hours.  Small monitors will be put on your body. They are used to check your heart, blood pressure, and oxygen level.  An IV access tube will be put into one of your veins. Medicine will be able to flow directly into your body through this IV tube.  You might be given a medicine to help you relax (sedative).  You will be given a medicine to make you sleep through the procedure (general anesthetic). A breathing tube may be placed into your lungs during the procedure.  A thin, flexible tube (catheter) will be placed into your bladder to  collect urine.  A tube may be put in through your nose. It is called a nasogastric tube. It is used to remove stomach fluids after surgery until the intestines start working again.  Once you are asleep, the surgeon will make an incision in the abdomen.  Clamps or staples are put on both ends of the diseased part of the colon.  The part of the intestine between the clamps or staples is removed.  If possible, the ends of the healthy colon that remain will be stitched or stapled together to allow your body to expel waste (stool).  Sometimes, the remaining colon cannot be stitched back together. If this is the case, a colostomy is needed. For a colostomy:  An opening (stoma) to the outside of your body is made through the abdomen.  The end of the colon is brought to the opening. It is stitched to the skin.  A bag is attached to the opening. Stool will drain into this bag. The bag is removable.  The colostomy can be temporary or permanent.  The incision from the colectomy will  be closed with stitches or staples. AFTER THE PROCEDURE  You will stay in a recovery area until the anesthetic has worn off. Your blood pressure and pulse will be checked often. Then you will be taken to a hospital room.  You will continue to get fluids through the IV tube for a while. The IV tube will be taken out when the colon starts working again.  You will start on clear liquids and gradually go back to a normal diet.  You will be encouraged to cough and to take deep breaths to open your lungs and prevent pneumonia.  Some pain is normal after a colectomy. You will be given pain medicine as needed.  You will be urged to get up and start walking within a day.  If you had a colostomy, your health care provider will explain how it works and what you will need to do.  You will likely need to stay in the hospital for 3-7 days.   This information is not intended to replace advice given to you by your health  care provider. Make sure you discuss any questions you have with your health care provider.   Document Released: 07/05/2009 Document Revised: 06/28/2013 Document Reviewed: 04/19/2013 Elsevier Interactive Patient Education [employer].

## 2015-08-23 ENCOUNTER — Encounter (HOSPITAL_COMMUNITY): Payer: Medicaid Other | Attending: Hematology & Oncology

## 2015-08-23 VITALS — BP 119/70 | HR 78 | Temp 98.2°F | Resp 18

## 2015-08-23 DIAGNOSIS — Z452 Encounter for adjustment and management of vascular access device: Secondary | ICD-10-CM | POA: Diagnosis present

## 2015-08-23 DIAGNOSIS — Z95828 Presence of other vascular implants and grafts: Secondary | ICD-10-CM

## 2015-08-23 DIAGNOSIS — C541 Malignant neoplasm of endometrium: Secondary | ICD-10-CM | POA: Insufficient documentation

## 2015-08-23 MED ORDER — HEPARIN SOD (PORK) LOCK FLUSH 100 UNIT/ML IV SOLN
500.0000 [IU] | Freq: Once | INTRAVENOUS | Status: AC
  Administered 2015-08-23: 500 [IU] via INTRAVENOUS

## 2015-08-23 MED ORDER — SODIUM CHLORIDE 0.9 % IJ SOLN
10.0000 mL | Freq: Once | INTRAMUSCULAR | Status: AC
  Administered 2015-08-23: 10 mL via INTRAVENOUS

## 2015-08-23 MED ORDER — HEPARIN SOD (PORK) LOCK FLUSH 100 UNIT/ML IV SOLN
INTRAVENOUS | Status: AC
  Filled 2015-08-23: qty 5

## 2015-08-23 NOTE — Patient Instructions (Signed)
[facility] at [facility] Discharge Instructions  RECOMMENDATIONS MADE BY THE CONSULTANT AND ANY TEST RESULTS WILL BE SENT TO YOUR REFERRING PHYSICIAN.  Port flush today as ordered. Return as scheduled.  Thank you for choosing [facility] at [facility] to provide your oncology and hematology care.  To afford each patient quality time with our provider, please arrive at least 15 minutes before your scheduled appointment time.    You need to re-schedule your appointment should you arrive 10 or more minutes late.  We strive to give you quality time with our providers, and arriving late affects you and other patients whose appointments are after yours.  Also, if you no show three or more times for appointments you may be dismissed from the clinic at the providers discretion.     Again, thank you for choosing [facility].  Our hope is that these requests will decrease the amount of time that you wait before being seen by our physicians.       _____________________________________________________________  Should you have questions after your visit to [facility], please contact our office at [phone] between the hours of 8:30 a.m. and 4:30 p.m.  Voicemails left after 4:30 p.m. will not be returned until the following business day.  For prescription refill requests, have your pharmacy contact our office.    

## 2015-08-23 NOTE — Progress Notes (Signed)
Camelia Eng presented for Portacath access and flush. Proper placement of portacath confirmed by CXR. Double port Portacath located right chest wall accessed with  H 20 needle both sides of double port. Good blood return present. Each side of double port Portacath flushed with 55ml NS and 500U/77ml Heparin each and needle removed intact. Procedure without incident. Patient tolerated procedure well.

## 2015-08-26 ENCOUNTER — Encounter (HOSPITAL_COMMUNITY)
Admission: RE | Admit: 2015-08-26 | Discharge: 2015-08-26 | Disposition: A | Discharge status: Home / Self Care | Payer: Medicaid Other | Source: Ambulatory Visit | Attending: General Surgery | Admitting: General Surgery

## 2015-08-26 ENCOUNTER — Encounter (HOSPITAL_COMMUNITY): Payer: Self-pay

## 2015-08-26 ENCOUNTER — Ambulatory Visit (HOSPITAL_COMMUNITY)
Admission: RE | Admit: 2015-08-26 | Discharge: 2015-08-26 | Disposition: A | Discharge status: Home / Self Care | Payer: Medicaid Other | Source: Ambulatory Visit | Attending: General Surgery | Admitting: General Surgery

## 2015-08-26 DIAGNOSIS — C189 Malignant neoplasm of colon, unspecified: Secondary | ICD-10-CM | POA: Diagnosis not present

## 2015-08-26 DIAGNOSIS — Z01812 Encounter for preprocedural laboratory examination: Secondary | ICD-10-CM | POA: Insufficient documentation

## 2015-08-26 DIAGNOSIS — Z01818 Encounter for other preprocedural examination: Secondary | ICD-10-CM | POA: Insufficient documentation

## 2015-08-26 LAB — CBC WITH DIFFERENTIAL/PLATELET
BASOS ABS: 0 10*3/uL (ref 0.0–0.1)
Basophils Relative: 0 %
EOS PCT: 2 %
Eosinophils Absolute: 0.1 10*3/uL (ref 0.0–0.7)
HEMATOCRIT: 37.9 % (ref 36.0–46.0)
Hemoglobin: 12.6 g/dL (ref 12.0–15.0)
LYMPHS ABS: 1.7 10*3/uL (ref 0.7–4.0)
LYMPHS PCT: 32 %
MCH: 30.6 pg (ref 26.0–34.0)
MCHC: 33.2 g/dL (ref 30.0–36.0)
MCV: 92 fL (ref 78.0–100.0)
MONO ABS: 0.5 10*3/uL (ref 0.1–1.0)
MONOS PCT: 10 %
NEUTROS ABS: 3 10*3/uL (ref 1.7–7.7)
Neutrophils Relative %: 56 %
PLATELETS: 270 10*3/uL (ref 150–400)
RBC: 4.12 MIL/uL (ref 3.87–5.11)
RDW: 13.4 % (ref 11.5–15.5)
WBC: 5.3 10*3/uL (ref 4.0–10.5)

## 2015-08-26 LAB — COMPREHENSIVE METABOLIC PANEL
ALT: 14 U/L (ref 14–54)
ANION GAP: 5 (ref 5–15)
AST: 13 U/L — ABNORMAL LOW (ref 15–41)
Albumin: 4 g/dL (ref 3.5–5.0)
Alkaline Phosphatase: 63 U/L (ref 38–126)
BILIRUBIN TOTAL: 0.6 mg/dL (ref 0.3–1.2)
BUN: 12 mg/dL (ref 6–20)
CHLORIDE: 106 mmol/L (ref 101–111)
CO2: 29 mmol/L (ref 22–32)
Calcium: 9.1 mg/dL (ref 8.9–10.3)
Creatinine, Ser: 0.82 mg/dL (ref 0.44–1.00)
Glucose, Bld: 93 mg/dL (ref 65–99)
POTASSIUM: 4.3 mmol/L (ref 3.5–5.1)
Sodium: 140 mmol/L (ref 135–145)
TOTAL PROTEIN: 6.9 g/dL (ref 6.5–8.1)

## 2015-08-26 LAB — ABO/RH: ABO/RH(D): A POS

## 2015-08-26 LAB — PREPARE RBC (CROSSMATCH)

## 2015-08-26 NOTE — Pre-Procedure Instructions (Signed)
Patient given information to sign up for my chart at home. 

## 2015-08-27 LAB — CEA: CEA: 2.1 ng/mL (ref 0.0–4.7)

## 2015-08-30 ENCOUNTER — Encounter (HOSPITAL_COMMUNITY)
Admission: RE | Disposition: A | Discharge status: Home / Self Care | Payer: Self-pay | Source: Ambulatory Visit | Attending: General Surgery

## 2015-08-30 ENCOUNTER — Inpatient Hospital Stay (HOSPITAL_COMMUNITY): Payer: Medicaid Other | Admitting: Anesthesiology

## 2015-08-30 ENCOUNTER — Encounter (HOSPITAL_COMMUNITY): Payer: Self-pay | Admitting: *Deleted

## 2015-08-30 ENCOUNTER — Inpatient Hospital Stay (HOSPITAL_COMMUNITY)
Admission: RE | Admit: 2015-08-30 | Discharge: 2015-09-02 | DRG: 331 | Disposition: A | Discharge status: Home / Self Care | Payer: Medicaid Other | Source: Ambulatory Visit | Attending: General Surgery | Admitting: General Surgery

## 2015-08-30 DIAGNOSIS — C183 Malignant neoplasm of hepatic flexure: Principal | ICD-10-CM | POA: Diagnosis present

## 2015-08-30 DIAGNOSIS — F17200 Nicotine dependence, unspecified, uncomplicated: Secondary | ICD-10-CM | POA: Diagnosis present

## 2015-08-30 DIAGNOSIS — D490 Neoplasm of unspecified behavior of digestive system: Secondary | ICD-10-CM | POA: Diagnosis present

## 2015-08-30 DIAGNOSIS — Z9221 Personal history of antineoplastic chemotherapy: Secondary | ICD-10-CM | POA: Diagnosis not present

## 2015-08-30 DIAGNOSIS — C55 Malignant neoplasm of uterus, part unspecified: Secondary | ICD-10-CM | POA: Diagnosis not present

## 2015-08-30 DIAGNOSIS — J45909 Unspecified asthma, uncomplicated: Secondary | ICD-10-CM | POA: Diagnosis not present

## 2015-08-30 HISTORY — PX: PARTIAL COLECTOMY: SHX5273

## 2015-08-30 SURGERY — COLECTOMY, PARTIAL
Anesthesia: General

## 2015-08-30 MED ORDER — TRAMADOL HCL 50 MG PO TABS
50.0000 mg | ORAL_TABLET | Freq: Four times a day (QID) | ORAL | Status: DC | PRN
  Administered 2015-08-31: 50 mg via ORAL
  Filled 2015-08-30: qty 1

## 2015-08-30 MED ORDER — KETOROLAC TROMETHAMINE 30 MG/ML IJ SOLN
30.0000 mg | Freq: Once | INTRAMUSCULAR | Status: AC
  Administered 2015-08-30: 30 mg via INTRAVENOUS

## 2015-08-30 MED ORDER — SODIUM CHLORIDE 0.9 % IR SOLN
Status: DC | PRN
  Administered 2015-08-30: 2000 mL

## 2015-08-30 MED ORDER — LACTATED RINGERS IV SOLN
INTRAVENOUS | Status: DC
  Administered 2015-08-30 (×4): via INTRAVENOUS

## 2015-08-30 MED ORDER — ENOXAPARIN SODIUM 40 MG/0.4ML ~~LOC~~ SOLN
40.0000 mg | SUBCUTANEOUS | Status: DC
  Administered 2015-08-31 – 2015-09-02 (×3): 40 mg via SUBCUTANEOUS
  Filled 2015-08-30 (×3): qty 0.4

## 2015-08-30 MED ORDER — DIPHENHYDRAMINE HCL 12.5 MG/5ML PO ELIX: 12.5000 mg | ORAL_SOLUTION | Freq: Four times a day (QID) | ORAL | Status: DC | PRN

## 2015-08-30 MED ORDER — DEXAMETHASONE SODIUM PHOSPHATE 4 MG/ML IJ SOLN
4.0000 mg | Freq: Once | INTRAMUSCULAR | Status: AC
  Administered 2015-08-30: 4 mg via INTRAVENOUS

## 2015-08-30 MED ORDER — MIDAZOLAM HCL 2 MG/2ML IJ SOLN
1.0000 mg | INTRAMUSCULAR | Status: DC | PRN
  Administered 2015-08-30: 2 mg via INTRAVENOUS

## 2015-08-30 MED ORDER — GLYCOPYRROLATE 0.2 MG/ML IJ SOLN
INTRAMUSCULAR | Status: DC | PRN
  Administered 2015-08-30: 0.6 mg via INTRAVENOUS

## 2015-08-30 MED ORDER — DIPHENHYDRAMINE HCL 50 MG/ML IJ SOLN
25.0000 mg | Freq: Once | INTRAMUSCULAR | Status: AC
  Administered 2015-08-30: 25 mg via INTRAVENOUS

## 2015-08-30 MED ORDER — ALBUTEROL SULFATE (2.5 MG/3ML) 0.083% IN NEBU: 2.5000 mg | INHALATION_SOLUTION | RESPIRATORY_TRACT | Status: DC | PRN

## 2015-08-30 MED ORDER — METHOCARBAMOL 500 MG PO TABS
500.0000 mg | ORAL_TABLET | Freq: Four times a day (QID) | ORAL | Status: DC | PRN
  Administered 2015-08-30: 500 mg via ORAL
  Filled 2015-08-30: qty 1

## 2015-08-30 MED ORDER — PROPOFOL 10 MG/ML IV BOLUS
INTRAVENOUS | Status: DC | PRN
  Administered 2015-08-30: 150 mg via INTRAVENOUS

## 2015-08-30 MED ORDER — NEOSTIGMINE METHYLSULFATE 10 MG/10ML IV SOLN
INTRAVENOUS | Status: DC | PRN
  Administered 2015-08-30: 4 mg via INTRAVENOUS

## 2015-08-30 MED ORDER — ONDANSETRON HCL 4 MG/2ML IJ SOLN
INTRAMUSCULAR | Status: AC
  Filled 2015-08-30: qty 2

## 2015-08-30 MED ORDER — PROMETHAZINE HCL 25 MG/ML IJ SOLN
INTRAMUSCULAR | Status: AC
  Filled 2015-08-30: qty 1

## 2015-08-30 MED ORDER — ONDANSETRON HCL 4 MG/2ML IJ SOLN
4.0000 mg | Freq: Four times a day (QID) | INTRAMUSCULAR | Status: DC | PRN
  Administered 2015-08-31 – 2015-09-02 (×2): 4 mg via INTRAVENOUS
  Filled 2015-08-30 (×2): qty 2

## 2015-08-30 MED ORDER — GLYCOPYRROLATE 0.2 MG/ML IJ SOLN
INTRAMUSCULAR | Status: AC
  Filled 2015-08-30: qty 1

## 2015-08-30 MED ORDER — ONDANSETRON HCL 4 MG/2ML IJ SOLN
4.0000 mg | Freq: Once | INTRAMUSCULAR | Status: DC | PRN
  Filled 2015-08-30: qty 2

## 2015-08-30 MED ORDER — SODIUM CHLORIDE 0.9 % IJ SOLN
INTRAMUSCULAR | Status: AC
  Filled 2015-08-30: qty 3

## 2015-08-30 MED ORDER — SCOPOLAMINE 1 MG/3DAYS TD PT72
1.0000 | MEDICATED_PATCH | Freq: Once | TRANSDERMAL | Status: AC
  Administered 2015-08-30: 1.5 mg via TRANSDERMAL

## 2015-08-30 MED ORDER — ACETAMINOPHEN 325 MG PO TABS: 650.0000 mg | ORAL_TABLET | Freq: Four times a day (QID) | ORAL | Status: DC | PRN

## 2015-08-30 MED ORDER — LIDOCAINE HCL (PF) 1 % IJ SOLN
INTRAMUSCULAR | Status: DC | PRN
  Administered 2015-08-30: 50 mg

## 2015-08-30 MED ORDER — PANTOPRAZOLE SODIUM 40 MG PO TBEC
40.0000 mg | DELAYED_RELEASE_TABLET | Freq: Every day | ORAL | Status: DC
  Administered 2015-08-30 – 2015-09-01 (×3): 40 mg via ORAL
  Filled 2015-08-30 (×3): qty 1

## 2015-08-30 MED ORDER — FENTANYL CITRATE (PF) 100 MCG/2ML IJ SOLN
25.0000 ug | INTRAMUSCULAR | Status: DC | PRN
  Administered 2015-08-30 (×4): 50 ug via INTRAVENOUS
  Filled 2015-08-30 (×2): qty 2

## 2015-08-30 MED ORDER — GLYCOPYRROLATE 0.2 MG/ML IJ SOLN
0.2000 mg | Freq: Once | INTRAMUSCULAR | Status: AC
  Administered 2015-08-30: 0.2 mg via INTRAVENOUS

## 2015-08-30 MED ORDER — LIDOCAINE HCL (CARDIAC) 20 MG/ML IV SOLN
INTRAVENOUS | Status: DC | PRN
  Administered 2015-08-30: 50 mg via INTRAVENOUS

## 2015-08-30 MED ORDER — LACTATED RINGERS IV SOLN
INTRAVENOUS | Status: DC
  Administered 2015-08-30 (×2): via INTRAVENOUS

## 2015-08-30 MED ORDER — ALVIMOPAN 12 MG PO CAPS
12.0000 mg | ORAL_CAPSULE | Freq: Once | ORAL | Status: AC
  Administered 2015-08-30: 12 mg via ORAL

## 2015-08-30 MED ORDER — METRONIDAZOLE IN NACL 5-0.79 MG/ML-% IV SOLN
500.0000 mg | INTRAVENOUS | Status: AC
  Administered 2015-08-30: 500 mg via INTRAVENOUS

## 2015-08-30 MED ORDER — FENTANYL CITRATE (PF) 100 MCG/2ML IJ SOLN
INTRAMUSCULAR | Status: DC | PRN
  Administered 2015-08-30: 50 ug via INTRAVENOUS
  Administered 2015-08-30: 100 ug via INTRAVENOUS

## 2015-08-30 MED ORDER — DIPHENHYDRAMINE HCL 50 MG/ML IJ SOLN
INTRAMUSCULAR | Status: AC
  Filled 2015-08-30: qty 1

## 2015-08-30 MED ORDER — ACETAMINOPHEN 650 MG RE SUPP: 650.0000 mg | Freq: Four times a day (QID) | RECTAL | Status: DC | PRN

## 2015-08-30 MED ORDER — ALPRAZOLAM 0.25 MG PO TABS
0.2500 mg | ORAL_TABLET | Freq: Three times a day (TID) | ORAL | Status: DC | PRN
Start: 2015-08-30 — End: 2015-09-02

## 2015-08-30 MED ORDER — BUPIVACAINE LIPOSOME 1.3 % IJ SUSP
INTRAMUSCULAR | Status: DC | PRN
  Administered 2015-08-30: 20 mL

## 2015-08-30 MED ORDER — FENTANYL CITRATE (PF) 100 MCG/2ML IJ SOLN
50.0000 ug | INTRAMUSCULAR | Status: DC | PRN
  Administered 2015-08-30 – 2015-08-31 (×4): 50 ug via INTRAVENOUS
  Filled 2015-08-30 (×4): qty 2

## 2015-08-30 MED ORDER — PROMETHAZINE HCL 25 MG/ML IJ SOLN
6.2500 mg | Freq: Once | INTRAMUSCULAR | Status: AC
  Administered 2015-08-30: 6.25 mg via INTRAVENOUS

## 2015-08-30 MED ORDER — TRAZODONE HCL 50 MG PO TABS
50.0000 mg | ORAL_TABLET | Freq: Every day | ORAL | Status: DC
  Administered 2015-08-30 – 2015-09-01 (×3): 50 mg via ORAL
  Filled 2015-08-30 (×3): qty 1

## 2015-08-30 MED ORDER — BUPIVACAINE LIPOSOME 1.3 % IJ SUSP
INTRAMUSCULAR | Status: AC
  Filled 2015-08-30: qty 20

## 2015-08-30 MED ORDER — DEXAMETHASONE SODIUM PHOSPHATE 4 MG/ML IJ SOLN
INTRAMUSCULAR | Status: AC
  Filled 2015-08-30: qty 1

## 2015-08-30 MED ORDER — ONDANSETRON HCL 4 MG/2ML IJ SOLN
INTRAMUSCULAR | Status: DC | PRN
  Administered 2015-08-30: 4 mg via INTRAVENOUS

## 2015-08-30 MED ORDER — CITALOPRAM HYDROBROMIDE 20 MG PO TABS
20.0000 mg | ORAL_TABLET | Freq: Every day | ORAL | Status: DC
  Administered 2015-08-30 – 2015-09-02 (×4): 20 mg via ORAL
  Filled 2015-08-30 (×4): qty 1

## 2015-08-30 MED ORDER — POVIDONE-IODINE 10 % OINT PACKET
TOPICAL_OINTMENT | CUTANEOUS | Status: DC | PRN
  Administered 2015-08-30: 1 via TOPICAL

## 2015-08-30 MED ORDER — ALVIMOPAN 12 MG PO CAPS
ORAL_CAPSULE | ORAL | Status: AC
  Filled 2015-08-30: qty 1

## 2015-08-30 MED ORDER — SCOPOLAMINE 1 MG/3DAYS TD PT72
MEDICATED_PATCH | TRANSDERMAL | Status: AC
  Filled 2015-08-30: qty 1

## 2015-08-30 MED ORDER — CIPROFLOXACIN IN D5W 400 MG/200ML IV SOLN
400.0000 mg | INTRAVENOUS | Status: AC
  Administered 2015-08-30: 400 mg via INTRAVENOUS

## 2015-08-30 MED ORDER — DIPHENHYDRAMINE HCL 50 MG/ML IJ SOLN: 12.5000 mg | Freq: Four times a day (QID) | INTRAMUSCULAR | Status: DC | PRN

## 2015-08-30 MED ORDER — CHLORHEXIDINE GLUCONATE 4 % EX LIQD: 1.0000 "application " | Freq: Once | CUTANEOUS | Status: DC

## 2015-08-30 MED ORDER — POVIDONE-IODINE 10 % EX OINT
TOPICAL_OINTMENT | CUTANEOUS | Status: AC
Start: 2015-08-30 — End: 2015-08-30
  Filled 2015-08-30: qty 1

## 2015-08-30 MED ORDER — SIMETHICONE 80 MG PO CHEW: 40.0000 mg | CHEWABLE_TABLET | Freq: Four times a day (QID) | ORAL | Status: DC | PRN

## 2015-08-30 MED ORDER — MIDAZOLAM HCL 2 MG/2ML IJ SOLN
INTRAMUSCULAR | Status: AC
  Filled 2015-08-30: qty 2

## 2015-08-30 MED ORDER — PROPOFOL 10 MG/ML IV BOLUS
INTRAVENOUS | Status: AC
  Filled 2015-08-30: qty 20

## 2015-08-30 MED ORDER — ONDANSETRON 4 MG PO TBDP
4.0000 mg | ORAL_TABLET | Freq: Four times a day (QID) | ORAL | Status: DC | PRN
  Administered 2015-08-31 – 2015-09-01 (×3): 4 mg via ORAL
  Filled 2015-08-30 (×3): qty 1

## 2015-08-30 MED ORDER — CIPROFLOXACIN IN D5W 400 MG/200ML IV SOLN
INTRAVENOUS | Status: AC
  Filled 2015-08-30: qty 200

## 2015-08-30 MED ORDER — ALVIMOPAN 12 MG PO CAPS
12.0000 mg | ORAL_CAPSULE | Freq: Two times a day (BID) | ORAL | Status: DC
  Administered 2015-08-31 – 2015-09-02 (×5): 12 mg via ORAL
  Filled 2015-08-30 (×5): qty 1

## 2015-08-30 MED ORDER — ONDANSETRON HCL 4 MG/2ML IJ SOLN
4.0000 mg | Freq: Once | INTRAMUSCULAR | Status: AC
  Administered 2015-08-30: 4 mg via INTRAVENOUS

## 2015-08-30 MED ORDER — METRONIDAZOLE IN NACL 5-0.79 MG/ML-% IV SOLN
INTRAVENOUS | Status: AC
  Filled 2015-08-30: qty 100

## 2015-08-30 MED ORDER — FENTANYL CITRATE (PF) 250 MCG/5ML IJ SOLN
INTRAMUSCULAR | Status: AC
  Filled 2015-08-30: qty 5

## 2015-08-30 MED ORDER — LORAZEPAM 2 MG/ML IJ SOLN: 1.0000 mg | INTRAMUSCULAR | Status: DC | PRN

## 2015-08-30 MED ORDER — KETOROLAC TROMETHAMINE 30 MG/ML IJ SOLN
INTRAMUSCULAR | Status: AC
  Filled 2015-08-30: qty 1

## 2015-08-30 MED ORDER — ROCURONIUM BROMIDE 100 MG/10ML IV SOLN
INTRAVENOUS | Status: DC | PRN
  Administered 2015-08-30: 5 mg via INTRAVENOUS
  Administered 2015-08-30: 35 mg via INTRAVENOUS

## 2015-08-30 SURGICAL SUPPLY — 43 items
BAG HAMPER (MISCELLANEOUS) ×3 IMPLANT
CHLORAPREP W/TINT 26ML (MISCELLANEOUS) ×3 IMPLANT
CLOTH BEACON ORANGE TIMEOUT ST (SAFETY) ×3 IMPLANT
COVER LIGHT HANDLE STERIS (MISCELLANEOUS) ×6 IMPLANT
COVER MAYO STAND XLG (DRAPE) ×3 IMPLANT
DRAPE UTILITY W/TAPE 26X15 (DRAPES) ×6 IMPLANT
DRAPE WARM FLUID 44X44 (DRAPE) ×3 IMPLANT
DRSG OPSITE POSTOP 4X10 (GAUZE/BANDAGES/DRESSINGS) ×3 IMPLANT
ELECT REM PT RETURN 9FT ADLT (ELECTROSURGICAL) ×3
ELECTRODE REM PT RTRN 9FT ADLT (ELECTROSURGICAL) ×1 IMPLANT
GLOVE BIOGEL M 7.0 STRL (GLOVE) ×12 IMPLANT
GLOVE BIOGEL PI IND STRL 7.0 (GLOVE) ×8 IMPLANT
GLOVE BIOGEL PI IND STRL 8 (GLOVE) ×2 IMPLANT
GLOVE BIOGEL PI INDICATOR 7.0 (GLOVE) ×16
GLOVE BIOGEL PI INDICATOR 8 (GLOVE) ×4
GLOVE ECLIPSE 8.0 STRL XLNG CF (GLOVE) ×6 IMPLANT
GLOVE SURG SS PI 7.5 STRL IVOR (GLOVE) ×6 IMPLANT
GOWN STRL REUS W/ TWL XL LVL3 (GOWN DISPOSABLE) ×2 IMPLANT
GOWN STRL REUS W/TWL LRG LVL3 (GOWN DISPOSABLE) ×18 IMPLANT
GOWN STRL REUS W/TWL XL LVL3 (GOWN DISPOSABLE) ×4
INST SET MAJOR GENERAL (KITS) ×3 IMPLANT
KIT BLADEGUARD II DBL (SET/KITS/TRAYS/PACK) ×3 IMPLANT
KIT ROOM TURNOVER APOR (KITS) ×3 IMPLANT
LIGASURE IMPACT 36 18CM CVD LR (INSTRUMENTS) ×3 IMPLANT
MANIFOLD NEPTUNE II (INSTRUMENTS) ×3 IMPLANT
NEEDLE HYPO 18GX1.5 BLUNT FILL (NEEDLE) ×3 IMPLANT
NS IRRIG 1000ML POUR BTL (IV SOLUTION) ×9 IMPLANT
PACK ABDOMINAL MAJOR (CUSTOM PROCEDURE TRAY) ×3 IMPLANT
PAD ARMBOARD 7.5X6 YLW CONV (MISCELLANEOUS) ×3 IMPLANT
PENCIL HANDSWITCHING (ELECTRODE) ×3 IMPLANT
RELOAD PROXIMATE 75MM BLUE (ENDOMECHANICALS) ×6 IMPLANT
RETRACTOR WND ALEXIS 25 LRG (MISCELLANEOUS) ×1 IMPLANT
RTRCTR WOUND ALEXIS 25CM LRG (MISCELLANEOUS) ×3
SET BASIN LINEN APH (SET/KITS/TRAYS/PACK) ×3 IMPLANT
SPONGE LAP 18X18 X RAY DECT (DISPOSABLE) ×3 IMPLANT
STAPLER GUN LINEAR PROX 60 (STAPLE) ×3 IMPLANT
STAPLER PROXIMATE 75MM BLUE (STAPLE) ×3 IMPLANT
STAPLER VISISTAT (STAPLE) ×3 IMPLANT
SUCTION POOLE TIP (SUCTIONS) ×3 IMPLANT
SUT PDS AB 0 CTX 60 (SUTURE) ×3 IMPLANT
SUT SILK 3 0 SH CR/8 (SUTURE) ×3 IMPLANT
SYR 20CC LL (SYRINGE) ×3 IMPLANT
TRAY FOLEY CATH SILVER 16FR (SET/KITS/TRAYS/PACK) ×3 IMPLANT

## 2015-08-30 NOTE — Anesthesia Postprocedure Evaluation (Signed)
Anesthesia Post Note  Patient: Julia Osborne  Procedure(s) Performed: Procedure(s): RIGHT HEMICOLECTOMY  Patient location during evaluation: Nursing Unit Anesthesia Type: General Level of consciousness: awake and alert and oriented Pain management: pain level controlled Vital Signs Assessment: post-procedure vital signs reviewed and stable Respiratory status: respiratory function stable Cardiovascular status: blood pressure returned to baseline Postop Assessment: no signs of nausea or vomiting Anesthetic complications: no    Last Vitals:  Filed Vitals:   08/30/15 1132 08/30/15 1315  BP: 99/54 107/68  Pulse: 74 71  Temp: 36.8 C 37 C  Resp: 18 18    Last Pain:  Filed Vitals:   08/30/15 1317  PainSc: Asleep                 ADAMS, AMY A

## 2015-08-30 NOTE — Op Note (Signed)
Patient:  ELDEEN KARY  DOB:  1961/08/09  MRN:  [id number]   Preop Diagnosis:  Colon neoplasm  Postop Diagnosis:  Same  Procedure:  Right hemicolectomy  Surgeon:  Aviva Signs, M.D.  Anes:  Gen. endotracheal  Indications:  Patient is a 54 year old white female who was found on colonoscopy to have a large adenomatous polyp which could not be removed endoscopically. The patient now comes to the operating room for a partial colectomy. The risks and benefits of the procedure including bleeding, infection, cardiopulmonary difficulties, and the possibility of an anastomotic leak were fully explained to the patient, who gave informed consent.  Procedure note:  The patient was placed in the supine position. After induction of general endotracheal anesthesia, the abdomen was prepped and draped using usual sterile technique with ChloraPrep. Surgical site confirmation was performed.  A midline incision was made from below the xiphoid process to the umbilicus. The peritoneal cavity was entered into without difficulty. The liver was inspected and noted to be within normal limits. The to tattoo marks identifying the lesion were noted in the mid transverse colon. The right colon was mobilized along its peritoneal reflection without difficulty. The dissection was taken around the hepatic flexure. A GIA 75 stapler was placed across the terminal ileum and fired. This was likewise done across the mid transverse colon, avoiding the middle colic artery. The right colon mesentery as well as the hepatic flexure was divided using the LigaSure. The specimen was sent to pathology further examination. A side to side ileocolic anastomosis was performed using a GIA-75 stapler. A widely patent anastomosis was palpated. The enterotomy was closed using a TA 60 stapler. The staple line was bolstered using 3-0 silk sutures. Surrounding omentum was placed over the anastomosis and secured using 3-0 silk sutures. The mesenteric  defect was closed using an 0 chromic gut running suture. The abdominal cavity was copiously irrigated with normal saline. All operating personnel then changed their gowns and gloves. A new setup was also used. The fascia was reapproximated using a looped 0 PDS running suture. Subcutaneous layer was irrigated with normal saline. Exparel was instilled into the surrounding wound. The skin was closed using staples. Betadine ointment and dry sterile dressings were applied.  All tape and needle counts were correct at the end of the procedure. The patient was extubated in the operating room and transferred to PACU in stable condition.  Complications:  None  EBL:  50 mL  Specimen:  Right colon

## 2015-08-30 NOTE — Anesthesia Procedure Notes (Signed)
Procedure Name: Intubation Date/Time: 08/30/2015 7:37 AM Performed by: Gershon Mussel, Moris Ratchford Pre-anesthesia Checklist: Patient identified, Patient being monitored, Timeout performed, Emergency Drugs available and Suction available Patient Re-evaluated:Patient Re-evaluated prior to inductionOxygen Delivery Method: Circle System Utilized Preoxygenation: Pre-oxygenation with 100% oxygen Intubation Type: IV induction Ventilation: Mask ventilation without difficulty Laryngoscope Size: Miller and 2 Grade View: Grade I Tube type: Oral Tube size: 7.0 mm Number of attempts: 1 Airway Equipment and Method: Stylet Placement Confirmation: ETT inserted through vocal cords under direct vision,  positive ETCO2 and breath sounds checked- equal and bilateral Secured at: 21 cm Tube secured with: Tape Dental Injury: Teeth and Oropharynx as per pre-operative assessment

## 2015-08-30 NOTE — Addendum Note (Signed)
Addendum  created 08/30/15 1751 by Mickel Baas, CRNA   Modules edited: Clinical Notes   Clinical Notes:  File: [id number]

## 2015-08-30 NOTE — Transfer of Care (Signed)
Immediate Anesthesia Transfer of Care Note  Patient: Julia Osborne  Procedure(s) Performed: Procedure(s): RIGHT HEMICOLECTOMY  Patient Location: PACU  Anesthesia Type:General  Level of Consciousness: awake, alert , patient cooperative and responds to stimulation  Airway & Oxygen Therapy: Patient Spontanous Breathing and Patient connected to face mask oxygen  Post-op Assessment: Report given to RN, Post -op Vital signs reviewed and stable and Patient moving all extremities X 4  Post vital signs: Reviewed and stable  Last Vitals:  Filed Vitals:   08/30/15 0720 08/30/15 0725  BP: 119/78 123/70  Temp:    Resp: 11 28    Complications: No apparent anesthesia complications

## 2015-08-30 NOTE — Interval H&P Note (Signed)
History and Physical Interval Note:  08/30/2015 7:14 AM  Julia Osborne  has presented today for surgery, with the diagnosis of malignant colon neoplasm  The various methods of treatment have been discussed with the patient and family. After consideration of risks, benefits and other options for treatment, the patient has consented to  Procedure(s): RIGHT HEMI COLECTOMY (Right) as a surgical intervention .  The patient's history has been reviewed, patient examined, no change in status, stable for surgery.  I have reviewed the patient's chart and labs.  Questions were answered to the patient's satisfaction.     Aviva Signs A

## 2015-08-30 NOTE — Anesthesia Postprocedure Evaluation (Signed)
Anesthesia Post Note  Patient: Julia Osborne  Procedure(s) Performed: Procedure(s): RIGHT HEMICOLECTOMY  Patient location during evaluation: PACU Anesthesia Type: General Level of consciousness: awake, patient cooperative, oriented, responds to stimulation and awake and alert Pain management: pain level controlled Vital Signs Assessment: post-procedure vital signs reviewed and stable Respiratory status: spontaneous breathing, nonlabored ventilation and patient connected to face mask oxygen Cardiovascular status: stable Postop Assessment: no headache Anesthetic complications: no    Last Vitals:  Filed Vitals:   08/30/15 0720 08/30/15 0725  BP: 119/78 123/70  Temp:    Resp: 11 28    Last Pain: There were no vitals filed for this visit.               Alanii Ramer

## 2015-08-30 NOTE — OR Nursing (Signed)
Patient c/o itching of nose and face and scalp after given Fentanyl. Order obtained for Benadryl.Patient tolerating ice chips well.

## 2015-08-30 NOTE — Anesthesia Preprocedure Evaluation (Signed)
Anesthesia Evaluation  Patient identified by MRN, date of birth, ID band Patient awake    Reviewed: Allergy & Precautions, NPO status , Patient's Chart, lab work & pertinent test results  Airway Mallampati: I  TM Distance: >3 FB Neck ROM: Full    Dental  (+) Teeth Intact   Pulmonary asthma , COPD,  COPD inhaler, Current Smoker,    breath sounds clear to auscultation       Cardiovascular negative cardio ROS   Rhythm:Regular Rate:Normal     Neuro/Psych PSYCHIATRIC DISORDERS Anxiety Depression  Neuromuscular disease    GI/Hepatic Hiatal hernia: dysphagia., GERD  Medicated,  Endo/Other    Renal/GU      Musculoskeletal   Abdominal   Peds  Hematology  (+) anemia ,   Anesthesia Other Findings   Reproductive/Obstetrics                             Anesthesia Physical Anesthesia Plan  ASA: III  Anesthesia Plan: General   Post-op Pain Management:    Induction: Intravenous, Rapid sequence and Cricoid pressure planned  Airway Management Planned: Oral ETT  Additional Equipment:   Intra-op Plan:   Post-operative Plan: Extubation in OR  Informed Consent: I have reviewed the patients History and Physical, chart, labs and discussed the procedure including the risks, benefits and alternatives for the proposed anesthesia with the patient or authorized representative who has indicated his/her understanding and acceptance.     Plan Discussed with:   Anesthesia Plan Comments:         Anesthesia Quick Evaluation

## 2015-08-31 LAB — CBC
HEMATOCRIT: 33.9 % — AB (ref 36.0–46.0)
HEMOGLOBIN: 11.2 g/dL — AB (ref 12.0–15.0)
MCH: 30.7 pg (ref 26.0–34.0)
MCHC: 33 g/dL (ref 30.0–36.0)
MCV: 92.9 fL (ref 78.0–100.0)
Platelets: 227 10*3/uL (ref 150–400)
RBC: 3.65 MIL/uL — AB (ref 3.87–5.11)
RDW: 13.5 % (ref 11.5–15.5)
WBC: 12.1 10*3/uL — AB (ref 4.0–10.5)

## 2015-08-31 LAB — BASIC METABOLIC PANEL
ANION GAP: 4 — AB (ref 5–15)
BUN: 9 mg/dL (ref 6–20)
CALCIUM: 8.4 mg/dL — AB (ref 8.9–10.3)
CO2: 29 mmol/L (ref 22–32)
Chloride: 107 mmol/L (ref 101–111)
Creatinine, Ser: 0.84 mg/dL (ref 0.44–1.00)
GFR calc Af Amer: >60 mL/min (ref >60)
GFR calc non Af Amer: >60 mL/min (ref >60)
GLUCOSE: 113 mg/dL — AB (ref 65–99)
POTASSIUM: 3.6 mmol/L (ref 3.5–5.1)
Sodium: 140 mmol/L (ref 135–145)

## 2015-08-31 LAB — PHOSPHORUS: Phosphorus: 3.5 mg/dL (ref 2.5–4.6)

## 2015-08-31 LAB — MAGNESIUM: Magnesium: 1.8 mg/dL (ref 1.7–2.4)

## 2015-08-31 MED ORDER — HYDROCODONE-ACETAMINOPHEN 5-325 MG PO TABS
1.0000 | ORAL_TABLET | ORAL | Status: DC | PRN
  Administered 2015-08-31: 2 via ORAL
  Administered 2015-08-31: 1 via ORAL
  Administered 2015-08-31: 2 via ORAL
  Administered 2015-08-31 – 2015-09-02 (×4): 1 via ORAL
  Filled 2015-08-31 (×2): qty 2
  Filled 2015-08-31: qty 1
  Filled 2015-08-31: qty 2
  Filled 2015-08-31 (×2): qty 1

## 2015-08-31 MED ORDER — KCL IN DEXTROSE-NACL 20-5-0.45 MEQ/L-%-% IV SOLN
INTRAVENOUS | Status: DC
  Administered 2015-08-31 (×2): via INTRAVENOUS
  Administered 2015-09-01: 1 mL via INTRAVENOUS

## 2015-08-31 NOTE — Progress Notes (Signed)
1 Day Post-Op  Subjective: Patient feels well. Moderate incisional pain, which she states she normally takes hydrocodone for as an outpatient.  Objective: Vital signs in last 24 hours: Temp:  [97.8 F (36.6 C)-98.6 F (37 C)] 98.4 F (36.9 C) (12/10 0600) Pulse Rate:  [60-92] 63 (12/10 0600) Resp:  [12-20] 18 (12/10 0600) BP: (99-172)/(54-76) 138/70 mmHg (12/10 0600) SpO2:  [95 %-100 %] 97 % (12/10 0600) Weight:  [73.1 kg (161 lb 2.5 oz)] 73.1 kg (161 lb 2.5 oz) (12/09 1132) Last BM Date: 08/30/15  Intake/Output from previous day: 12/09 0701 - 12/10 0700 In: 4283.3 [P.O.:580; I.V.:3703.3] Out: 4300 [Urine:4250; Blood:50] Intake/Output this shift:    General appearance: alert, cooperative and no distress Resp: clear to auscultation bilaterally Cardio: regular rate and rhythm, S1, S2 normal, no murmur, click, rub or gallop GI: Soft, incision healing well.  Lab Results:   Recent Labs  08/31/15 0632  WBC 12.1*  HGB 11.2*  HCT 33.9*  PLT 227   BMET  Recent Labs  08/31/15 0632  NA 140  K 3.6  CL 107  CO2 29  GLUCOSE 113*  BUN 9  CREATININE 0.84  CALCIUM 8.4*   PT/INR No results for input(s): LABPROT, INR in the last 72 hours.  Studies/Results: No results found.  Anti-infectives: Anti-infectives    Start     Dose/Rate Route Frequency Ordered Stop   08/30/15 0621  metroNIDAZOLE (FLAGYL) IVPB 500 mg     500 mg 100 mL/hr over 60 Minutes Intravenous On call to O.R. 08/30/15 [id number] 08/30/15 0727   08/30/15 [id number]  ciprofloxacin (CIPRO) IVPB 400 mg     400 mg 200 mL/hr over 60 Minutes Intravenous On call to O.R. 08/30/15 [id number] 08/30/15 0746      Assessment/Plan: s/p Procedure(s): RIGHT HEMICOLECTOMY Impression: Stable on postoperative day 1. Will get patient out of bed. Adjust IV fluids.  LOS: 1 day    Odena Mcquaid A 08/31/2015

## 2015-08-31 NOTE — Progress Notes (Signed)
SATURATION QUALIFICATIONS: (This note is used to comply with regulatory documentation for home oxygen)  Patient Saturations on Room Air at Rest = 98%  Patient Saturations on Room Air while Ambulating =94%     

## 2015-09-01 LAB — CBC
HEMATOCRIT: 34.5 % — AB (ref 36.0–46.0)
HEMOGLOBIN: 11.5 g/dL — AB (ref 12.0–15.0)
MCH: 30.7 pg (ref 26.0–34.0)
MCHC: 33.3 g/dL (ref 30.0–36.0)
MCV: 92.2 fL (ref 78.0–100.0)
Platelets: 219 10*3/uL (ref 150–400)
RBC: 3.74 MIL/uL — AB (ref 3.87–5.11)
RDW: 13.5 % (ref 11.5–15.5)
WBC: 11 10*3/uL — ABNORMAL HIGH (ref 4.0–10.5)

## 2015-09-01 LAB — PHOSPHORUS: PHOSPHORUS: 3.3 mg/dL (ref 2.5–4.6)

## 2015-09-01 LAB — BASIC METABOLIC PANEL
Anion gap: 6 (ref 5–15)
BUN: 6 mg/dL (ref 6–20)
CHLORIDE: 105 mmol/L (ref 101–111)
CO2: 28 mmol/L (ref 22–32)
CREATININE: 0.84 mg/dL (ref 0.44–1.00)
Calcium: 8.2 mg/dL — ABNORMAL LOW (ref 8.9–10.3)
GFR calc non Af Amer: >60 mL/min (ref >60)
Glucose, Bld: 105 mg/dL — ABNORMAL HIGH (ref 65–99)
POTASSIUM: 3.5 mmol/L (ref 3.5–5.1)
Sodium: 139 mmol/L (ref 135–145)

## 2015-09-01 LAB — MAGNESIUM: MAGNESIUM: 1.8 mg/dL (ref 1.7–2.4)

## 2015-09-01 NOTE — Progress Notes (Signed)
Vomited green bile, Zofran ODT given.  Will continue to monitor.  Regular diet on tray and patient eating part of food.  Will continue to monitor.

## 2015-09-01 NOTE — Progress Notes (Signed)
No further episodes of vomiting.  Ate 1/4 of dinner tray.  Will continue to monitor

## 2015-09-01 NOTE — Progress Notes (Signed)
Vicodin 2 tablets were given at 1405, patient took one and wanted crackers to help prevent nausea with pain medication.  Returned to room with crackers.  Later in shift found that patient had only taken one of Vicodin and planned to take 2nd one if no nausea.  Informed patient that if she was not going to take 2nd Vicodin that I would have to waste it.  Went to check on another patient and Ms Nuding took 2nd Vicodin prior to me returning to room.  Oncoming nurse, Tisha, aware.  Only one Vicodin to be given next time pain medication requested.  Changed on medication record.

## 2015-09-01 NOTE — Progress Notes (Signed)
2 Days Post-Op  Subjective: Patient has not passed flatus or had a bowel movement yet, though she feels rumbling in her stomach. No nausea at the present time.  Objective: Vital signs in last 24 hours: Temp:  [98.2 F (36.8 C)-98.8 F (37.1 C)] 98.2 F (36.8 C) (12/11 0504) Pulse Rate:  [72-92] 72 (12/11 0504) Resp:  [18] 18 (12/11 0504) BP: (94-157)/(65-77) 136/69 mmHg (12/11 0504) SpO2:  [92 %-99 %] 94 % (12/11 0504) Last BM Date: 08/30/15  Intake/Output from previous day: 12/10 0701 - 12/11 0700 In: 1598.8 [P.O.:960; I.V.:638.8] Out: -  Intake/Output this shift:    General appearance: alert, cooperative and no distress Resp: clear to auscultation bilaterally Cardio: regular rate and rhythm, S1, S2 normal, no murmur, click, rub or gallop GI: Soft, incision healing well. Occasional bowel sounds appreciated.  Lab Results:   Recent Labs  08/31/15 0632 09/01/15 0559  WBC 12.1* 11.0*  HGB 11.2* 11.5*  HCT 33.9* 34.5*  PLT 227 219   BMET  Recent Labs  08/31/15 0632 09/01/15 0559  NA 140 139  K 3.6 3.5  CL 107 105  CO2 29 28  GLUCOSE 113* 105*  BUN 9 6  CREATININE 0.84 0.84  CALCIUM 8.4* 8.2*   PT/INR No results for input(s): LABPROT, INR in the last 72 hours.  Studies/Results: No results found.  Anti-infectives: Anti-infectives    Start     Dose/Rate Route Frequency Ordered Stop   08/30/15 0621  metroNIDAZOLE (FLAGYL) IVPB 500 mg     500 mg 100 mL/hr over 60 Minutes Intravenous On call to O.R. 08/30/15 [id number] 08/30/15 0727   08/30/15 [id number]  ciprofloxacin (CIPRO) IVPB 400 mg     400 mg 200 mL/hr over 60 Minutes Intravenous On call to O.R. 08/30/15 [id number] 08/30/15 0746      Assessment/Plan: s/p Procedure(s): RIGHT HEMICOLECTOMY Impression: Postoperative day 2. Bowel function starting to return. Plan: We'll advance to regular diet. Anticipate discharge in next 24-48 hours.  LOS: 2 days    Brady Schiller A 09/01/2015

## 2015-09-02 ENCOUNTER — Encounter (HOSPITAL_COMMUNITY): Payer: Self-pay | Admitting: General Surgery

## 2015-09-02 LAB — TYPE AND SCREEN
ABO/RH(D): A POS
Antibody Screen: NEGATIVE
UNIT DIVISION: 0

## 2015-09-02 MED ORDER — HYDROCODONE-ACETAMINOPHEN 5-325 MG PO TABS: 1.0000 | ORAL_TABLET | ORAL | Status: DC | PRN

## 2015-09-02 NOTE — Discharge Instructions (Signed)
Open Colectomy, Care After °Refer to this sheet in the next few weeks. These instructions provide you with information on caring for yourself after your procedure. Your health care provider may also give you more specific instructions. Your treatment has been planned according to current medical practices, but problems sometimes occur. Call your health care provider if you have any problems or questions after your procedure. °WHAT TO EXPECT AFTER THE PROCEDURE °After your procedure, it is typical to have the following: °· Pain in your abdomen, especially along your incision. You will be given medicines to control the pain. °· Tiredness. This is a normal part of the recovery process. Your energy level will return to normal over the next several weeks. °· Constipation. You may be given a stool softener to prevent this. °HOME CARE INSTRUCTIONS °· Only take over-the-counter or prescription medicines as directed by your health care provider. °· Ask your health care provider whether you may take a shower when you go home. °· You may resume a normal diet and activities as directed. Eat plenty of fruits and vegetables to help prevent constipation. °· Drink enough fluids to keep your urine clear or pale yellow. This also helps prevent constipation. °· Take rest breaks during the day as needed. °· Avoid lifting anything heavier than 25 pounds (11.3 kg) or driving for 4 weeks or until your health care provider says it is okay. °· Follow up with your health care provider as directed. Ask your health care provider when you need to return to have your stitches or staples removed. °SEEK MEDICAL CARE IF: °· You have redness, swelling, or increasing pain in the incision area. °· You see pus coming from the incision area. °· You have a fever. °SEEK IMMEDIATE MEDICAL CARE IF:  °· You have chest pain or shortness of breath. °· You have pain or swelling in your legs. °· You have persistent nausea and vomiting. °· Your wound breaks open  after stitches or staples have been removed. °· You have increasing abdominal pain that is not relieved with medicine. °  °This information is not intended to replace advice given to you by your health care provider. Make sure you discuss any questions you have with your health care provider. °  °Document Released: 03/31/2011 Document Revised: 06/28/2013 Document Reviewed: 04/19/2013 °Elsevier Interactive Patient Education ©2016 Elsevier Inc. ° °

## 2015-09-02 NOTE — Discharge Summary (Signed)
Physician Discharge Summary  Patient ID: Julia Osborne MRN: [id number] DOB/AGE: 03/12/1961 54 y.o.  Admit date: 08/30/2015 Discharge date: 09/02/2015  Admission Diagnoses: Colon neoplasm  Discharge Diagnoses: Same  Active Problems:   Colon neoplasm   Discharged Condition: good  Hospital Courpatient is a 54 year old white female with a large adenomatous polyp which could not be fully removed endoscopically who presented for an elective partial colectomy. She underwent a right hemicolectomy on 08/30/2015. She tolerated the procedure well. Her postoperative course has been for the most part unremarkable. She did have an episode of emesis, but has since had multiple bowel movements. She is being discharged home on 09/02/2015 in good improving condition. Final pathology is pending.  Treatments: surgery: Right hemicolectomy on 08/30/2015   Discharge Exam: Blood pressure 98/58, pulse 79, temperature 99.2 F (37.3 C), temperature source Oral, resp. rate 20, height 5\' 3"  (1.6 m), weight 73.1 kg (161 lb 2.5 oz), SpO2 90 %. General appearance: alert, cooperative and no distress Resp: clear to auscultation bilaterally Cardio: regular rate and rhythm, S1, S2 normal, no murmur, click, rub or gallop GI: Soft. Incision healing well. Active bowel sounds appreciated.  Disposition: 01-Home or Self Care     Medication List    TAKE these medications        albuterol 108 (90 BASE) MCG/ACT inhaler  Commonly known as:  PROVENTIL HFA;VENTOLIN HFA  Inhale 2 puffs into the lungs every 2 (two) hours as needed for wheezing or shortness of breath (cough).     ALPRAZolam 0.25 MG tablet  Commonly known as:  XANAX  Take 0.25 mg by mouth 3 (three) times daily as needed for anxiety.     citalopram 20 MG tablet  Commonly known as:  CELEXA  Take 20 mg by mouth daily.     DEXILANT 60 MG capsule  Generic drug:  dexlansoprazole  Take 60 mg by mouth daily.     fexofenadine-pseudoephedrine 60-120 MG 12 hr  tablet  Commonly known as:  ALLEGRA-D  Take 1 tablet by mouth every 12 (twelve) hours.     gabapentin 300 MG capsule  Commonly known as:  NEURONTIN  Take one tablet at night for 5 days, then 2 at night for 5 days, then one in am, increase slowly to total of 1800 mg daily     HYDROcodone-acetaminophen 5-325 MG tablet  Commonly known as:  NORCO/VICODIN  Take 1-2 tablets by mouth every 4 (four) hours as needed for moderate pain.     Linaclotide 145 MCG Caps capsule  Commonly known as:  LINZESS  Take 1 capsule (145 mcg total) by mouth daily before breakfast.     omeprazole 20 MG capsule  Commonly known as:  PRILOSEC  Take 20 mg by mouth daily as needed.     ondansetron 8 MG tablet  Commonly known as:  ZOFRAN  TAKE 1 TABLET BY MOUTH EVERY 8 HOURS AS NEEDED FOR NAUSEA.     traMADol 50 MG tablet  Commonly known as:  ULTRAM  TAKE (1) TABLET BY MOUTH EVERY (6) HOURS AS NEEDED.     traZODone 50 MG tablet  Commonly known as:  DESYREL  Take 50 mg by mouth at bedtime.           Follow-up Information    Follow up with Jamesetta So, MD. Schedule an appointment as soon as possible for a visit on 09/10/2015.   Specialty:  General Surgery   Contact information:   [address] [id number] [phone]  SignedAviva Signs A 09/02/2015, 9:02 AM

## 2015-09-02 NOTE — Progress Notes (Signed)
Patient states understanding of discharge instructions, prescriptions given 

## 2015-09-02 NOTE — Care Management Note (Signed)
Case Management Note  Patient Details  Name: Julia Osborne MRN: [id number] Date of Birth: 05/01/1961  Subjective/Objective:                  Pt from home, ind with ADL's. No HH or DME prior to admission.   Action/Plan: Pt discharging home today with self care. No CM needs.   Expected Discharge Date:   09/02/2015               Expected Discharge Plan:  Home/Self Care  In-House Referral:  NA  Discharge planning Services  CM Consult  Post Acute Care Choice:  NA Choice offered to:  NA  DME Arranged:    DME Agency:     HH Arranged:    HH Agency:     Status of Service:  Completed, signed off  Medicare Important Message Given:    Date Medicare IM Given:    Medicare IM give by:    Date Additional Medicare IM Given:    Additional Medicare Important Message give by:     If discussed at [place] of Stay Meetings, dates discussed:    Additional Comments:  Sherald Barge, RN 09/02/2015, 2:11 PM

## 2015-09-13 ENCOUNTER — Encounter (HOSPITAL_COMMUNITY): Payer: Self-pay | Admitting: Oncology

## 2015-09-13 DIAGNOSIS — C189 Malignant neoplasm of colon, unspecified: Secondary | ICD-10-CM

## 2015-09-13 HISTORY — DX: Malignant neoplasm of colon, unspecified: C18.9

## 2015-09-19 ENCOUNTER — Other Ambulatory Visit (HOSPITAL_COMMUNITY): Payer: Self-pay | Admitting: Hematology & Oncology

## 2015-09-19 ENCOUNTER — Other Ambulatory Visit (HOSPITAL_COMMUNITY): Payer: Self-pay | Admitting: Oncology

## 2015-10-03 ENCOUNTER — Telehealth: Payer: Self-pay | Admitting: Genetic Counselor

## 2015-10-03 ENCOUNTER — Encounter (HOSPITAL_COMMUNITY): Payer: Medicaid Other | Attending: Hematology & Oncology | Admitting: Genetic Counselor

## 2015-10-03 ENCOUNTER — Encounter (HOSPITAL_COMMUNITY): Payer: Self-pay | Admitting: Genetic Counselor

## 2015-10-03 DIAGNOSIS — C189 Malignant neoplasm of colon, unspecified: Secondary | ICD-10-CM | POA: Diagnosis not present

## 2015-10-03 DIAGNOSIS — Z808 Family history of malignant neoplasm of other organs or systems: Secondary | ICD-10-CM

## 2015-10-03 DIAGNOSIS — C541 Malignant neoplasm of endometrium: Secondary | ICD-10-CM | POA: Diagnosis not present

## 2015-10-03 DIAGNOSIS — Z800 Family history of malignant neoplasm of digestive organs: Secondary | ICD-10-CM | POA: Diagnosis not present

## 2015-10-03 DIAGNOSIS — Z315 Encounter for genetic counseling: Secondary | ICD-10-CM

## 2015-10-03 DIAGNOSIS — Z8051 Family history of malignant neoplasm of kidney: Secondary | ICD-10-CM

## 2015-10-03 DIAGNOSIS — Z809 Family history of malignant neoplasm, unspecified: Secondary | ICD-10-CM

## 2015-10-03 NOTE — Progress Notes (Signed)
REFERRING PROVIDER: [facility] [address] [place], [place] [address]   Ancil Linsey, MD  PRIMARY PROVIDER:  [place]  PRIMARY REASON FOR VISIT:  1. Adenocarcinoma of colon ([place])   2. Endometrial cancer ([place])   3. Family history of cancer   4. Family history of kidney cancer      HISTORY OF PRESENT ILLNESS:   Julia Osborne, a 55 y.o. female, was seen for a [facility] cancer genetics consultation at the request of Dr. Whitney Muse due to a personal and family history of cancer.  Julia Osborne presents to clinic today to discuss the possibility of a hereditary predisposition to cancer, genetic testing, and to further clarify her future cancer risks, as well as potential cancer risks for family members.   In 2015, at the age of 31, Julia Osborne was diagnosed with uterine cancer. The pathology indicates that this was an edometroid cancer.  In 2016. At the age of 45, was diagnosed with adenocarcinoma of the colon.  She had surgery in December.     CANCER HISTORY:    Endometrial cancer ([place])   07/12/2014 Imaging CT C/A/P, pelvic adenopathy, multiple pulmonary nodules concerning for metastatic disease   07/30/2014 Initial Diagnosis Endometrial cancer   07/31/2014 Pathology Results endometrioid adenocarcinoma, G2, lymphovascular invasion present, positive pelvic lymph node and vaginal biopsy   07/31/2014 Definitive Surgery radical hysterectomy, BSO, pelvic lymph node dissection and vaginal biopsies   09/25/2014 Procedure Port-A-Cath placement in IR   10/18/2014 - 01/10/2015 Chemotherapy Carboplatin/Taxol. First cycle given at [facility].  S/P 6 cycles total, 5 cycles given at [facility].   07/04/2015 Procedure Colonoscopy by Dr. Gala Romney   07/04/2015 Pathology Results Colon, polyp(s), vicinity of hepatic flexure - TUBULAR ADENOMA WITH FOCAL HIGH GRADE DYSPLASIA.    Procedure Partial colectomy by Dr. Arnoldo Morale scheduled for 08/30/2015    Adenocarcinoma of colon [facility])   07/04/2015  Pathology Results Diagnosis 1. Colon, polyp(s), vicinity of hepatic flexure - TUBULAR ADENOMA WITH FOCAL HIGH GRADE DYSPLASIA. - NO INVASIVE CARCINOMA. 2. Colon, polyp(s), sigmoid - HYPERPLASTIC POLYP. - NO DYSPLASIA OR MALIGNANCY.   07/04/2015 Procedure Colonoscopoy by Dr. Gala Romney- large polypoid colonic lesion.   08/30/2015 Definitive Surgery Dr. Arnoldo Morale- right segmental colon resection   08/30/2015 Pathology Results [id number] intramucosal adenocarcinoma of colon, 0.2 cm inding the laminal propria with negative resection margins and 0/17 lymph nodes.     HORMONAL RISK FACTORS:  Menarche was at age 35.  First live birth at age 32.  OCP use for approximately <5 years.  Ovaries intact: no.  Hysterectomy: yes.  Menopausal status: postmenopausal.  HRT use: 0 years. Colonoscopy: yes; 2 polyps and adenocarcinoma. Mammogram within the last year: yes. Number of breast biopsies: 0. Up to date with pelvic exams:  yes. Any excessive radiation exposure in the past:  no  Past Medical History  Diagnosis Date  . Asthma   . Uterine fibroid   . COPD (chronic obstructive pulmonary disease) ([facility] [place])     no definite diagnosis  . Anemia   . Vaginal Pap smear, abnormal   . Indigestion   . Diverticulitis   . Vaginal dryness 05/16/2015  . Dyspareunia 05/16/2015  . GERD (gastroesophageal reflux disease)   . Anxiety   . Depression   . Neuropathy ([facility])     feet and hands  . Cancer ([place])     endometrial; cancer cells in intestine  . Vaginal itching 07/16/2015  . Adenocarcinoma of colon ([place]) 09/13/2015  . Family  history of cancer   . Family history of kidney cancer     Past Surgical History  Procedure Laterality Date  . Tubal ligation    . Appendectomy    . Abdominal hysterectomy    . Portacath placement Right 09/2014  . Colonoscopy with propofol N/A 03/14/2015    RMR: Internal hemorrhoids. colonic diverticulosis. Incomplete examination. Prepartation inadequate.  . Esophagogastroduodenoscopy (egd) with  propofol N/A 03/14/2015    RMR: Mild erosive reflux esophagitis status post passage o f a Maloney dilator. Abnormal gastric mucosa of uncertain significance as described above. status post biopsy, benign  . Esophageal dilation N/A 03/14/2015    Procedure: ESOPHAGEAL DILATION;  Surgeon: Daneil Dolin, MD;  Location: [facility];  Service: Endoscopy;  Laterality: N/A;  Maloney 3  . Esophageal biopsy N/A 03/14/2015    Procedure: BIOPSY;  Surgeon: Daneil Dolin, MD;  Location: [facility];  Service: Endoscopy;  Laterality: N/A;  Gastric  . Colonoscopy with propofol N/A 07/04/2015    Procedure: COLONOSCOPY WITH PROPOFOL; IN CECUM AT 1108; WITHDRAWAL TIME 52 MINUTES;  Surgeon: Daneil Dolin, MD;  Location: [facility];  Service: Endoscopy;  Laterality: N/A;  . Polypectomy N/A 07/04/2015    Procedure: POLYPECTOMY;  Surgeon: Daneil Dolin, MD;  Location: [facility];  Service: Endoscopy;  Laterality: N/A;  . Partial colectomy  08/30/2015    Procedure: RIGHT HEMICOLECTOMY;  Surgeon: Aviva Signs, MD;  Location: [facility];  Service: General;;    Social History   Social History  . Marital Status: Single    Spouse Name: N/A  . Number of Children: 2  . Years of Education: N/A   Social History Main Topics  . Smoking status: Current Every Day Smoker -- 0.50 packs/day for 25 years    Types: Cigarettes  . Smokeless tobacco: Never Used  . Alcohol Use: No  . Drug Use: No  . Sexual Activity: Not Currently    Birth Control/ Protection: Surgical     Comment: hyst   Other Topics Concern  . None   Social History Narrative     FAMILY HISTORY:  We obtained a detailed, 4-generation family history.  Significant diagnoses are listed below: Family History  Problem Relation Age of Onset  . Other Mother     clot that went to heart, deceased age 50ss  . Heart attack Father     age 77s, deceased  . Stroke Father   . Cancer Father     "at death determined he was ate up with cancer"  . Hypertension Sister   . Kidney cancer  Sister 46  . Diabetes Brother   . Hypertension Brother   . Endometriosis Daughter   . Heart attack Maternal Grandfather   . Diabetes Sister   . Colon cancer Neg Hx   . Brain cancer Paternal Aunt   . Cancer Paternal Uncle     NOS  . Cancer Paternal Uncle     NOS  . Cancer Paternal Uncle     NOS    The patient has two children, two full brothers one full sister and a maternal half sister.  Her full sister was diagnosed with kidney cancer at age 53.  This sister has a daughter who most likely had precancerous cells of the cervix.  The patient's parents are both deceased.  Her mother died at age 81 from a PE.  Her father died of a heart attack at 60.  When an autopsy was performed it was found that he  was "ate up with cancer".  Her father had 7-8 siblings.  One sister had a brain tumor and died.  Three brothers all died of cancer, unknown which type to the patient.  The patient's mother had one brother and one sister who are alive and cancer free.  There is no other reported history of cancer.  Patient's maternal ancestors are of [place] descent, and paternal ancestors are of [place] descent. There is no reported Ashkenazi Jewish ancestry. There is no known consanguinity.  GENETIC COUNSELING ASSESSMENT: ARCHER VISE is a 55 y.o. female with a personal history of colon and uterine cancer and family history of cancer in general, as well as kidney and brain cancer which is somewhat suggestive of a hereditary cancer syndrome and predisposition to cancer. We, therefore, discussed and recommended the following at today's visit.   DISCUSSION: We discussed that about 5% of colon cancer is the result of a hereditary colon cancer syndrome, most commonly Lynch syndrome.  With her diagnosis of uterine cancer, along with her sisters diagnosis of kidney cancer and her paternal aunt with brain cancer, Lynch syndrome is the most likely cause if this is hereditary.  We also discussed the POLD1 gene associated with  colon and uterine cancer. We reviewed the characteristics, features and inheritance patterns of hereditary cancer syndromes. We also discussed genetic testing, including the appropriate family members to test, the process of testing, insurance coverage and turn-around-time for results. We discussed the implications of a negative, positive and/or variant of uncertain significant result. Based on the patient's diagnosis of both colon and uterine cancer, we recommended Ms. Mcdonald pursue genetic testing for the genes on both the colorectal cancer and endometrial cancer gene panel. This custom panel offered by [facility] includes sequencing and/or duplication/deletion testing of the following 21 genes: APC, ATM, AXIN2, BRCA1, BRCA2, BMPR1A, CDH1, CHEK2, EPCAM, MLH1, MSH2, MSH6, MUTYH, PMS2, POLD1, POLE, PTEN, SCG5/GREM1, SMAD4, STK11, and TP53.      Based on Ms. Dales's personal and family history of cancer, she meets medical criteria for genetic testing. Despite that she meets criteria, she may still have an out of pocket cost. We discussed that if her out of pocket cost for testing is over $100, the laboratory will call and confirm whether she wants to proceed with testing.  If the out of pocket cost of testing is less than $100 she will be billed by the genetic testing laboratory.   PLAN: After considering the risks, benefits, and limitations, Ms. Weigand  provided informed consent to pursue genetic testing and the blood sample was sent to [facility] for analysis of the custom panel associated with the colorectal cancer panel and the GYN panel. Results should be available within approximately 2-3 weeks' time, at which point they will be disclosed by telephone to Ms. Burzynski, as will any additional recommendations warranted by these results. Ms. Myricks will receive a summary of her genetic counseling visit and a copy of her results once available. This information will also be available in Epic. We encouraged  Ms. Mclinden to remain in contact with cancer genetics annually so that we can continuously update the family history and inform her of any changes in cancer genetics and testing that may be of benefit for her family. Ms. Cashwell's questions were answered to her satisfaction today. Our contact information was provided should additional questions or concerns arise.  Lastly, we encouraged Ms. Billard to remain in contact with cancer genetics annually so that we can continuously update the  family history and inform her of any changes in cancer genetics and testing that may be of benefit for this family.   Ms.  Lipke's questions were answered to her satisfaction today. Our contact information was provided should additional questions or concerns arise. Thank you for the referral and allowing [place] to share in the care of your patient.   Karen P. Florene Glen, [place], [facility] Certified Genetic Counselor [email or web address] phone: [phone]  The patient was seen for a total of 60 minutes in face-to-face genetic counseling.  This patient was discussed with Drs. Magrinat, Lindi Adie and/or Burr Medico who agrees with the above.    _______________________________________________________________________ For Office Staff:  Number of people involved in session: 1 Was an Intern/ student involved with case: no

## 2015-10-03 NOTE — Telephone Encounter (Signed)
LM on VM reminding her of the appointment today at 11 AM.  Explained that if she needs to cancel the appointment to please call the cancer center and r/s the appointment.

## 2015-10-17 ENCOUNTER — Encounter: Payer: Self-pay | Admitting: Gastroenterology

## 2015-10-17 ENCOUNTER — Ambulatory Visit (INDEPENDENT_AMBULATORY_CARE_PROVIDER_SITE_OTHER): Payer: Medicaid Other | Admitting: Gastroenterology

## 2015-10-17 VITALS — BP 113/72 | HR 70 | Temp 98.5°F | Ht 63.0 in | Wt 151.4 lb

## 2015-10-17 DIAGNOSIS — R1012 Left upper quadrant pain: Secondary | ICD-10-CM | POA: Insufficient documentation

## 2015-10-17 MED ORDER — ALBUTEROL SULFATE HFA 108 (90 BASE) MCG/ACT IN AERS: 2.0000 | INHALATION_SPRAY | RESPIRATORY_TRACT | Status: DC | PRN

## 2015-10-17 NOTE — Progress Notes (Signed)
Referring Provider: [facility], [facility] Laud* Primary Care Physician:  Carlynn Spry, NP Primary GI: Dr. Gala Romney   Chief Complaint  Patient presents with  . Abdominal Pain    HPI:   Julia WELTZIN is a 55 y.o. female presenting today with a history of a large adenomatous hepatic flexure polyp Oct 2016, unable to be fully removed endoscopically, s/p right hemicolectomy on 08/30/15 by Dr. Arnoldo Morale. Path with adenocarcinoma. She has established care with Dr. Whitney Muse and undergone genetic counseling due to history of stage IV endometrial cancer and now with colon cancer. She presents today due to left-sided/left back pain.   Left-sided upper abdomen/back discomfort starting a few weeks ago. Appears in discomfort at time of appt. No fever or chills. Can't get comfortable. Never seems to ease off. Feels like a 7/10. Nothing worsens or relieves. Mild nausea no vomiting. No diarrhea or constipation. No abdominal bloating. No urinary discomfort, no foul odor. Inhaler for COPD, asthma not refilled.    Past Medical History  Diagnosis Date  . Asthma   . Uterine fibroid   . COPD (chronic obstructive pulmonary disease) ([place])     no definite diagnosis  . Anemia   . Vaginal Pap smear, abnormal   . Indigestion   . Diverticulitis   . Vaginal dryness 05/16/2015  . Dyspareunia 05/16/2015  . GERD (gastroesophageal reflux disease)   . Anxiety   . Depression   . Neuropathy ([facility])     feet and hands  . Cancer ([place])     endometrial; cancer cells in intestine  . Vaginal itching 07/16/2015  . Adenocarcinoma of colon ([place]) 09/13/2015  . Family history of cancer   . Family history of kidney cancer     Past Surgical History  Procedure Laterality Date  . Tubal ligation    . Appendectomy    . Abdominal hysterectomy    . Portacath placement Right 09/2014  . Colonoscopy with propofol N/A 03/14/2015    RMR: Internal hemorrhoids. colonic diverticulosis. Incomplete examination. Prepartation inadequate.  .  Esophagogastroduodenoscopy (egd) with propofol N/A 03/14/2015    RMR: Mild erosive reflux esophagitis status post passage o f a Maloney dilator. Abnormal gastric mucosa of uncertain significance as described above. status post biopsy, benign  . Esophageal dilation N/A 03/14/2015    Procedure: ESOPHAGEAL DILATION;  Surgeon: Daneil Dolin, MD;  Location: [facility];  Service: Endoscopy;  Laterality: N/A;  Maloney 25  . Esophageal biopsy N/A 03/14/2015    Procedure: BIOPSY;  Surgeon: Daneil Dolin, MD;  Location: [facility];  Service: Endoscopy;  Laterality: N/A;  Gastric  . Colonoscopy with propofol N/A 07/04/2015    RMR: Colonic diverticulosis . Large polypoid lesion in the vicinity of the hepatic flexure status post saline-assisted piecmeal snare polypectomy  with ablation and tattooing as described. Sigmoid polyp removed as described above.   . Polypectomy N/A 07/04/2015    Procedure: POLYPECTOMY;  Surgeon: Daneil Dolin, MD;  Location: [facility];  Service: Endoscopy;  Laterality: N/A;  . Partial colectomy  08/30/2015    Procedure: RIGHT HEMICOLECTOMY;  Surgeon: Aviva Signs, MD;  Location: [facility];  Service: General;;    Current Outpatient Prescriptions  Medication Sig Dispense Refill  . albuterol (PROVENTIL HFA;VENTOLIN HFA) 108 (90 BASE) MCG/ACT inhaler Inhale 2 puffs into the lungs every 2 (two) hours as needed for wheezing or shortness of breath (cough). (Patient taking differently: Inhale 1 puff into the lungs every 6 (six) hours as needed for wheezing or  shortness of breath (cough). ) 1 Inhaler 0  . ALPRAZolam (XANAX) 0.25 MG tablet Take 0.25 mg by mouth 3 (three) times daily as needed for anxiety.     . citalopram (CELEXA) 20 MG tablet Take 20 mg by mouth daily.    Marland Kitchen dexlansoprazole (DEXILANT) 60 MG capsule Take 60 mg by mouth daily. Reported on 10/17/2015    . gabapentin (NEURONTIN) 300 MG capsule Take one tablet at night for 5 days, then 2 at night for 5 days, then one in am, increase slowly to  total of 1800 mg daily 180 capsule 3  . traMADol (ULTRAM) 50 MG tablet TAKE (1) TABLET BY MOUTH EVERY (6) HOURS AS NEEDED. 30 tablet 1  . traZODone (DESYREL) 50 MG tablet Take 50 mg by mouth at bedtime.    . fexofenadine-pseudoephedrine (ALLEGRA-D) 60-120 MG per tablet Take 1 tablet by mouth every 12 (twelve) hours. (Patient not taking: Reported on 10/17/2015) 30 tablet 0  . HYDROcodone-acetaminophen (NORCO/VICODIN) 5-325 MG tablet Take 1-2 tablets by mouth every 4 (four) hours as needed for moderate pain. (Patient not taking: Reported on 10/17/2015) 50 tablet 0  . Linaclotide (LINZESS) 145 MCG CAPS capsule Take 1 capsule (145 mcg total) by mouth daily before breakfast. (Patient not taking: Reported on 10/17/2015) 30 capsule 5  . omeprazole (PRILOSEC) 20 MG capsule Take 20 mg by mouth daily as needed. Reported on 10/17/2015    . ondansetron (ZOFRAN) 8 MG tablet TAKE 1 TABLET BY MOUTH EVERY 8 HOURS AS NEEDED FOR NAUSEA. (Patient not taking: Reported on 10/17/2015) 30 tablet 0   No current facility-administered medications for this visit.    Allergies as of 10/17/2015 - Review Complete 10/17/2015  Allergen Reaction Noted  . Codeine Rash 08/25/2011    Family History  Problem Relation Age of Onset  . Other Mother     clot that went to heart, deceased age 77ss  . Heart attack Father     age 58s, deceased  . Stroke Father   . Cancer Father     "at death determined he was ate up with cancer"  . Hypertension Sister   . Kidney cancer Sister 21  . Diabetes Brother   . Hypertension Brother   . Endometriosis Daughter   . Heart attack Maternal Grandfather   . Diabetes Sister   . Colon cancer Neg Hx   . Brain cancer Paternal Aunt   . Cancer Paternal Uncle     NOS  . Cancer Paternal Uncle     NOS  . Cancer Paternal Uncle     NOS    Social History   Social History  . Marital Status: Single    Spouse Name: N/A  . Number of Children: 2  . Years of Education: N/A   Social History Main  Topics  . Smoking status: Current Every Day Smoker -- 0.50 packs/day for 25 years    Types: Cigarettes  . Smokeless tobacco: Never Used  . Alcohol Use: No  . Drug Use: No  . Sexual Activity: Not Currently    Birth Control/ Protection: Surgical     Comment: hyst   Other Topics Concern  . None   Social History Narrative    Review of Systems: As mentioned in HPI   Physical Exam: BP 113/72 mmHg  Pulse 70  Temp(Src) 98.5 F (36.9 C) (Oral)  Ht 5\' 3"  (1.6 m)  Wt 151 lb 6.4 oz (68.675 kg)  BMI 26.83 kg/m2 General:   Alert and oriented. Appears  uncomfortable.  Head:  Normocephalic and atraumatic. Eyes:  Conjuctiva clear without scleral icterus. Mouth:  Oral mucosa pink and moist. Good dentition. No lesions. Heart:  S1, S2 present without murmurs, rubs, or gallops. Regular rate and rhythm. Abdomen:  +BS, soft, TTP LUQ and LLQ and non-distended. No rebound or guarding. No HSM or masses noted. Msk:  Symmetrical without gross deformities. Normal posture. Extremities:  Without edema. Neurologic:  Alert and  oriented x4;  grossly normal neurologically. Psych:  Alert and cooperative. Normal mood and affect.

## 2015-10-17 NOTE — Patient Instructions (Signed)
We have scheduled you for a CT scan tomorrow.   Please go to the emergency room if you have a fever, chills, worsening pain, nausea, vomiting.

## 2015-10-18 ENCOUNTER — Encounter (HOSPITAL_COMMUNITY): Payer: Medicaid Other | Attending: Hematology & Oncology

## 2015-10-18 ENCOUNTER — Ambulatory Visit (HOSPITAL_COMMUNITY)
Admission: RE | Admit: 2015-10-18 | Discharge: 2015-10-18 | Disposition: A | Discharge status: Home / Self Care | Payer: Medicaid Other | Source: Ambulatory Visit | Attending: Hematology & Oncology | Admitting: Hematology & Oncology

## 2015-10-18 ENCOUNTER — Other Ambulatory Visit (HOSPITAL_COMMUNITY): Payer: Self-pay | Admitting: Oncology

## 2015-10-18 ENCOUNTER — Encounter (HOSPITAL_COMMUNITY): Payer: Self-pay | Admitting: Hematology & Oncology

## 2015-10-18 ENCOUNTER — Encounter (HOSPITAL_BASED_OUTPATIENT_CLINIC_OR_DEPARTMENT_OTHER): Payer: Medicaid Other | Admitting: Hematology & Oncology

## 2015-10-18 ENCOUNTER — Encounter: Payer: Self-pay | Admitting: Gastroenterology

## 2015-10-18 VITALS — BP 124/66 | HR 71 | Temp 98.1°F | Resp 18 | Wt 155.0 lb

## 2015-10-18 DIAGNOSIS — Z8542 Personal history of malignant neoplasm of other parts of uterus: Secondary | ICD-10-CM | POA: Diagnosis present

## 2015-10-18 DIAGNOSIS — Z9049 Acquired absence of other specified parts of digestive tract: Secondary | ICD-10-CM | POA: Insufficient documentation

## 2015-10-18 DIAGNOSIS — Z90710 Acquired absence of both cervix and uterus: Secondary | ICD-10-CM | POA: Insufficient documentation

## 2015-10-18 DIAGNOSIS — Z85038 Personal history of other malignant neoplasm of large intestine: Secondary | ICD-10-CM | POA: Insufficient documentation

## 2015-10-18 DIAGNOSIS — C541 Malignant neoplasm of endometrium: Secondary | ICD-10-CM

## 2015-10-18 DIAGNOSIS — R1013 Epigastric pain: Secondary | ICD-10-CM | POA: Insufficient documentation

## 2015-10-18 DIAGNOSIS — C189 Malignant neoplasm of colon, unspecified: Secondary | ICD-10-CM

## 2015-10-18 DIAGNOSIS — I7090 Unspecified atherosclerosis: Secondary | ICD-10-CM | POA: Insufficient documentation

## 2015-10-18 DIAGNOSIS — Z720 Tobacco use: Secondary | ICD-10-CM

## 2015-10-18 DIAGNOSIS — M62838 Other muscle spasm: Secondary | ICD-10-CM

## 2015-10-18 DIAGNOSIS — N281 Cyst of kidney, acquired: Secondary | ICD-10-CM | POA: Diagnosis not present

## 2015-10-18 DIAGNOSIS — R109 Unspecified abdominal pain: Secondary | ICD-10-CM | POA: Diagnosis not present

## 2015-10-18 LAB — COMPREHENSIVE METABOLIC PANEL
ALBUMIN: 4 g/dL (ref 3.5–5.0)
ALT: 22 U/L (ref 14–54)
ANION GAP: 10 (ref 5–15)
AST: 20 U/L (ref 15–41)
Alkaline Phosphatase: 70 U/L (ref 38–126)
BILIRUBIN TOTAL: 0.4 mg/dL (ref 0.3–1.2)
BUN: 13 mg/dL (ref 6–20)
CHLORIDE: 104 mmol/L (ref 101–111)
CO2: 26 mmol/L (ref 22–32)
Calcium: 8.9 mg/dL (ref 8.9–10.3)
Creatinine, Ser: 0.84 mg/dL (ref 0.44–1.00)
GFR calc Af Amer: >60 mL/min (ref >60)
GFR calc non Af Amer: >60 mL/min (ref >60)
GLUCOSE: 95 mg/dL (ref 65–99)
POTASSIUM: 4.1 mmol/L (ref 3.5–5.1)
SODIUM: 140 mmol/L (ref 135–145)
TOTAL PROTEIN: 7 g/dL (ref 6.5–8.1)

## 2015-10-18 LAB — CBC WITH DIFFERENTIAL/PLATELET
BASOS ABS: 0 10*3/uL (ref 0.0–0.1)
BASOS PCT: 0 %
EOS ABS: 0.1 10*3/uL (ref 0.0–0.7)
Eosinophils Relative: 1 %
HEMATOCRIT: 35.8 % — AB (ref 36.0–46.0)
Hemoglobin: 12.1 g/dL (ref 12.0–15.0)
Lymphocytes Relative: 34 %
Lymphs Abs: 2.2 10*3/uL (ref 0.7–4.0)
MCH: 30.5 pg (ref 26.0–34.0)
MCHC: 33.8 g/dL (ref 30.0–36.0)
MCV: 90.2 fL (ref 78.0–100.0)
MONO ABS: 0.6 10*3/uL (ref 0.1–1.0)
Monocytes Relative: 9 %
NEUTROS ABS: 3.6 10*3/uL (ref 1.7–7.7)
NEUTROS PCT: 56 %
Platelets: 242 10*3/uL (ref 150–400)
RBC: 3.97 MIL/uL (ref 3.87–5.11)
RDW: 13.8 % (ref 11.5–15.5)
WBC: 6.5 10*3/uL (ref 4.0–10.5)

## 2015-10-18 MED ORDER — CYCLOBENZAPRINE HCL 10 MG PO TABS: 10.0000 mg | ORAL_TABLET | Freq: Three times a day (TID) | ORAL | Status: DC | PRN

## 2015-10-18 MED ORDER — HEPARIN SOD (PORK) LOCK FLUSH 100 UNIT/ML IV SOLN
INTRAVENOUS | Status: AC
  Filled 2015-10-18: qty 5

## 2015-10-18 MED ORDER — DIATRIZOATE MEGLUMINE & SODIUM 66-10 % PO SOLN
ORAL | Status: AC
  Filled 2015-10-18: qty 30

## 2015-10-18 MED ORDER — HEPARIN SOD (PORK) LOCK FLUSH 100 UNIT/ML IV SOLN: 500.0000 [IU] | Freq: Once | INTRAVENOUS | Status: DC

## 2015-10-18 MED ORDER — IOHEXOL 300 MG/ML  SOLN
100.0000 mL | Freq: Once | INTRAMUSCULAR | Status: AC | PRN
  Administered 2015-10-18: 100 mL via INTRAVENOUS

## 2015-10-18 MED ORDER — SODIUM CHLORIDE 0.9% FLUSH: 10.0000 mL | INTRAVENOUS | Status: DC | PRN

## 2015-10-18 NOTE — Patient Instructions (Signed)
..  [place] at [facility] Discharge Instructions  RECOMMENDATIONS MADE BY THE CONSULTANT AND ANY TEST RESULTS WILL BE SENT TO YOUR REFERRING PHYSICIAN.  Port flushes as scheduled  Thank you for choosing [place] at [facility] to provide your oncology and hematology care.  To afford each patient quality time with our provider, please arrive at least 15 minutes before your scheduled appointment time.   Beginning January 23rd 2017 lab work for the [facility] will be done in the  Main lab at [facility] on 1st floor. If you have a lab appointment with the [place] please come in thru the  Main Entrance and check in at the main information desk  You need to re-schedule your appointment should you arrive 10 or more minutes late.  We strive to give you quality time with our providers, and arriving late affects you and other patients whose appointments are after yours.  Also, if you no show three or more times for appointments you may be dismissed from the clinic at the providers discretion.     Again, thank you for choosing [facility].  Our hope is that these requests will decrease the amount of time that you wait before being seen by our physicians.       _____________________________________________________________  Should you have questions after your visit to [facility], please contact our office at [phone] between the hours of 8:30 a.m. and 4:30 p.m.  Voicemails left after 4:30 p.m. will not be returned until the following business day.  For prescription refill requests, have your pharmacy contact our office.

## 2015-10-18 NOTE — Assessment & Plan Note (Signed)
55 year old with extensive medical history as described in HPI, s/p recent hemicolectomy due to large colon polyp, path noting adenocarcinoma. She now has a several week history of vague LUQ/left back pain, vague in description without any easing of symptoms. Does not appear clinically to be pancreatitis. Hopefully, this is musculoskeletal in nature. However, on physical exam, she has mild to moderate tenderness in LUQ/LLQ. Afebrile and vitals are stable. I recommended a CT today, stat, due to her extensive medical history and recent surgery. However, she is declining this, stating "I have bills to pay". I discussed signs/symptoms that would necessitate urgent evaluation. She is agreeable to a CT on 1/27. Further recommendations to follow.

## 2015-10-18 NOTE — Patient Instructions (Signed)
[place] at [facility] Discharge Instructions  RECOMMENDATIONS MADE BY THE CONSULTANT AND ANY TEST RESULTS WILL BE SENT TO YOUR REFERRING PHYSICIAN.    Thank you for choosing [place] at [facility] to provide your oncology and hematology care.  To afford each patient quality time with our provider, please arrive at least 15 minutes before your scheduled appointment time.   Beginning January 23rd 2017 lab work for the [facility] will be done in the  Main lab at [facility] on 1st floor. If you have a lab appointment with the [place] please come in thru the  Main Entrance and check in at the main information desk  You need to re-schedule your appointment should you arrive 10 or more minutes late.  We strive to give you quality time with our providers, and arriving late affects you and other patients whose appointments are after yours.  Also, if you no show three or more times for appointments you may be dismissed from the clinic at the providers discretion.     Again, thank you for choosing [facility].  Our hope is that these requests will decrease the amount of time that you wait before being seen by our physicians.       _____________________________________________________________  Should you have questions after your visit to [facility], please contact our office at [phone] between the hours of 8:30 a.m. and 4:30 p.m.  Voicemails left after 4:30 p.m. will not be returned until the following business day.  For prescription refill requests, have your pharmacy contact our office.

## 2015-10-18 NOTE — Progress Notes (Signed)
[facility] PROGRESS NOTE  CHIEF COMPLAINTS:  Stage IV Endometrial Cancer, probable pulmonary metastases. Radical hysterectomy, BSO, pelvic LN dissection and vaginal biopsies 07/31/2014, patient had extensive pelvic disease including positive LN and positive distal vaginal biopsy  CA-125 137.41 on 07/19/2014 CA-125 25.72 on 09/27/2014  (normal range 0-20.9U/ml)  Tubular adenoma with focal high grade dysplasia on C-scope 07/04/2015 R Hemicolectomy with Julia Osborne on 08/30/2015 intramucosal adenocarcinoma of colon, 16 benign LN Genetics Referral 10/03/2015  HISTORY OF PRESENTING ILLNESS:   Julia Osborne 55 y.o. female is here because of stage IV endometrial cancer. She has completed all therapy. Julia Osborne is here alone today.   Her only complaint is a constant dull ache in her left abdomen that has been happening for several weeks. It first began when she went to put something in the oven and it has hurt worse ever since. This pain continues regardless of how she moves. It feels like a "stuck pain" under the rib. She has seen Julia Osborne for this who believed it may be a strained muscle. When asked if the pain increases with movement, the patient states that it really hurts all the time. Denies doing anything different.   Denies any new cough, vomiting, bowel issues, or cough. Her appetite is normal.      Endometrial cancer (HCC)   07/12/2014 Imaging CT C/A/P, pelvic adenopathy, multiple pulmonary nodules concerning for metastatic disease   07/30/2014 Initial Diagnosis Endometrial cancer   07/31/2014 Pathology Results endometrioid adenocarcinoma, G2, lymphovascular invasion present, positive pelvic lymph node and vaginal biopsy   07/31/2014 Definitive Surgery radical hysterectomy, BSO, pelvic lymph node dissection and vaginal biopsies   09/25/2014 Procedure Port-A-Cath placement in IR   10/18/2014 - 01/10/2015 Chemotherapy Carboplatin/Taxol. [facility] cycle [facility] at [facility].  S/P 6 cycles  total, 5 cycles given at [facility]   07/04/2015 Procedure Colonoscopy by Julia Osborne   07/04/2015 Pathology Results Colon, polyp(s), vicinity of hepatic flexure - TUBULAR ADENOMA WITH FOCAL HIGH GRADE DYSPLASIA.    Procedure Partial colectomy by Julia Osborne scheduled for 08/30/2015    Adenocarcinoma of colon [facility])   07/04/2015 Pathology Results Diagnosis 1. Colon, polyp(s), vicinity of hepatic flexure - TUBULAR ADENOMA WITH FOCAL HIGH GRADE DYSPLASIA. - NO INVASIVE CARCINOMA. 2. Colon, polyp(s), sigmoid - HYPERPLASTIC POLYP. - NO DYSPLASIA OR MALIGNANCY.   07/04/2015 Procedure Colonoscopoy by Julia Osborne- large polypoid colonic lesion.   08/30/2015 Definitive Surgery Julia Osborne- right segmental colon resection   08/30/2015 Pathology Results [id number] intramucosal adenocarcinoma of colon, 0.2 cm inding the laminal propria with negative resection margins and 0/17 lymph nodes.     MEDICAL HISTORY:  Past Medical History  Diagnosis Date  . Asthma   . Uterine fibroid   . COPD (chronic obstructive pulmonary disease) ([place])     no definite diagnosis  . Anemia   . Vaginal Pap smear, abnormal   . Indigestion   . Diverticulitis   . Vaginal dryness 05/16/2015  . Dyspareunia 05/16/2015  . GERD (gastroesophageal reflux disease)   . Anxiety   . Depression   . Neuropathy (HCC)     feet and hands  . Cancer ([place])     endometrial; cancer cells in intestine  . Vaginal itching 07/16/2015  . Adenocarcinoma of colon ([place]) 09/13/2015  . Family history of cancer   . Family history of kidney cancer     SURGICAL HISTORY: Past Surgical History  Procedure Laterality Date  . Tubal ligation    . Appendectomy    .  Abdominal hysterectomy    . Portacath placement Right 09/2014  . Colonoscopy with propofol Julia Osborne 03/14/2015    RMR: Internal hemorrhoids. colonic diverticulosis. Incomplete examination. Prepartation inadequate.  . Esophagogastroduodenoscopy (egd) with propofol Julia Osborne 03/14/2015    RMR: Mild erosive reflux  esophagitis status post passage o f a Maloney dilator. Abnormal gastric mucosa of uncertain significance as described above. status post biopsy, benign  . Esophageal dilation Julia Osborne 03/14/2015    Procedure: ESOPHAGEAL DILATION;  Surgeon: Julia Dolin, MD;  Location: [facility];  Service: Endoscopy;  Laterality: Julia Osborne;  Maloney 25  . Biopsy Julia Osborne 03/14/2015    Procedure: BIOPSY;  Surgeon: Julia Dolin, MD;  Location: [facility];  Service: Endoscopy;  Laterality: Julia Osborne;  Gastric  . Colonoscopy with propofol Julia Osborne 07/04/2015    RMR: Colonic diverticulosis . Large polypoid lesion in the vicinity of the hepatic flexure status post saline-assisted piecmeal snare polypectomy  with ablation and tattooing as described. Sigmoid polyp removed as described above. sigmoid colon polyp hyperplastic, hepatic flexure polyp with TA with focal high grade dysplasia   . Polypectomy Julia Osborne 07/04/2015    Procedure: POLYPECTOMY;  Surgeon: Julia Dolin, MD;  Location: [facility];  Service: Endoscopy;  Laterality: Julia Osborne;  . Partial colectomy  08/30/2015    polyp with adenocarcinoma    SOCIAL HISTORY: Social History   Social History  . Marital Status: Single    Spouse Name: Julia Osborne  . Number of Children: 2  . Years of Education: Julia Osborne   Occupational History  . Not on file.   Social History Main Topics  . Smoking status: Current Every Day Smoker -- 0.50 packs/day for 25 years    Types: Cigarettes  . Smokeless tobacco: Never Used  . Alcohol Use: No  . Drug Use: No  . Sexual Activity: Not Currently    Birth Control/ Protection: Surgical     Comment: hyst   Other Topics Concern  . Not on file   Social History Narrative  She was working, but now has applied for SSI and disability. She is now living with her niece. 2 children. 3 grandchildren, one is deceased. Divorced. Smoker 1/2 ppd. Trying to cut back.  Rare alcohol use.   FAMILY HISTORY: Family History  Problem Relation Age of Onset  . Other Mother     clot that went to heart,  deceased age 66ss  . Heart attack Father     age 19s, deceased  . Stroke Father   . Cancer Father     "at death determined he was ate up with cancer"  . Hypertension Sister   . Kidney cancer Sister 73  . Diabetes Brother   . Hypertension Brother   . Endometriosis Daughter   . Heart attack Maternal Grandfather   . Diabetes Sister   . Colon cancer Neg Hx   . Brain cancer Paternal Aunt   . Cancer Paternal Uncle     NOS  . Cancer Paternal Uncle     NOS  . Cancer Paternal Uncle     NOS   indicated that her mother is deceased. She indicated that her father is deceased. She indicated that both of her sisters are alive. She indicated that both of her brothers are alive. She indicated that her maternal grandmother is deceased. She indicated that her maternal grandfather is deceased. She indicated that her paternal grandmother is deceased. She indicated that her paternal grandfather is deceased. She indicated that her daughter is alive. She indicated that her son  is alive. She indicated that her maternal aunt is alive. She indicated that her maternal uncle is alive. She indicated that her paternal aunt is deceased. She indicated that all of her three paternal uncles are deceased.    Mother deceased at 55 from embolus Father deceased at 14 from MI 2 sisters and 2 brothers, Oldest sister had nephrectomy for RCC.   ALLERGIES:  is allergic to codeine.  MEDICATIONS:  Current Outpatient Prescriptions  Medication Sig Dispense Refill  . albuterol (PROVENTIL HFA;VENTOLIN HFA) 108 (90 Base) MCG/ACT inhaler Inhale 2 puffs into the lungs every 2 (two) hours as needed for wheezing or shortness of breath (cough). 1 Inhaler 3  . ALPRAZolam (XANAX) 0.25 MG tablet Take 0.25 mg by mouth 3 (three) times daily as needed for anxiety.     . citalopram (CELEXA) 20 MG tablet Take 20 mg by mouth daily.    Marland Kitchen dexlansoprazole (DEXILANT) 60 MG capsule Take 60 mg by mouth daily. Reported on 10/17/2015    .  fexofenadine-pseudoephedrine (ALLEGRA-D) 60-120 MG per tablet Take 1 tablet by mouth every 12 (twelve) hours. 30 tablet 0  . gabapentin (NEURONTIN) 300 MG capsule Take one tablet at night for 5 days, then 2 at night for 5 days, then one in am, increase slowly to total of 1800 mg daily 180 capsule 3  . HYDROcodone-acetaminophen (NORCO/VICODIN) 5-325 MG tablet Take 1-2 tablets by mouth every 4 (four) hours as needed for moderate pain. 50 tablet 0  . Linaclotide (LINZESS) 145 MCG CAPS capsule Take 1 capsule (145 mcg total) by mouth daily before breakfast. 30 capsule 5  . omeprazole (PRILOSEC) 20 MG capsule Take 20 mg by mouth daily as needed. Reported on 10/17/2015    . ondansetron (ZOFRAN) 8 MG tablet TAKE 1 TABLET BY MOUTH EVERY 8 HOURS AS NEEDED FOR NAUSEA. 30 tablet 0  . traMADol (ULTRAM) 50 MG tablet TAKE (1) TABLET BY MOUTH EVERY (6) HOURS AS NEEDED. 30 tablet 1  . traZODone (DESYREL) 50 MG tablet Take 50 mg by mouth at bedtime.    . cyclobenzaprine (FLEXERIL) 10 MG tablet Take 1 tablet (10 mg total) by mouth 3 (three) times daily as needed for muscle spasms. 30 tablet 1   No current facility-administered medications for this visit.    Review of Systems  Constitutional: Negative for fever, chills, weight loss and malaise/fatigue.  HENT: Negative for congestion, hearing loss, nosebleeds, sore throat and tinnitus.   Eyes: Negative for blurred vision, double vision, pain and discharge.  Respiratory: Negative for cough, hemoptysis, sputum production, shortness of breath and wheezing.   Cardiovascular: Negative for chest pain, palpitations, claudication, leg swelling and PND.  Gastrointestinal: Positive for abdominal pain. Negative for heartburn, diarrhea, constipation, blood in stool and melena.  Left-sided constant dull ache, last several weeks. Genitourinary: Negative for dysuria, urgency, frequency and hematuria.  Musculoskeletal: Negative for myalgias, joint pain and falls.  Skin: Negative  for itching and rash.  Neurological: Negative for dizziness, tingling, tremors, sensory change, speech change, focal weakness, seizures, loss of consciousness, weakness and headaches.  Endo/Heme/Allergies: Does not bruise/bleed easily.  Psychiatric/Behavioral: Negative for depression, suicidal ideas, memory loss and substance abuse. The patient is not nervous/anxious and does not have insomnia.    14 point review of systems was performed and is negative except as detailed under history of present illness and above   PHYSICAL EXAMINATION:  ECOG PERFORMANCE STATUS: 0 - Asymptomatic  Filed Vitals:   10/18/15 1058  BP: 124/66  Pulse: 71  Temp: 98.1 F (36.7 C)  Resp: 18   Filed Weights   10/18/15 1058  Weight: 155 lb (70.308 kg)   Physical Exam  Constitutional: She is oriented to person, place, and time and well-developed, well-nourished, and in no distress. hair regrowth noted  Well groomed HENT:  Head: Normocephalic and atraumatic.  Nose: Nose normal.  Mouth/Throat: Oropharynx is clear and moist. No oropharyngeal exudate.  Eyes: Conjunctivae and EOM are normal. Pupils are equal, round, and reactive to light. Right eye exhibits no discharge. Left eye exhibits no discharge. No scleral icterus.  Neck: Normal range of motion. Neck supple. No tracheal deviation present. No thyromegaly present.  Cardiovascular: Normal rate, regular rhythm and normal heart sounds.  Exam reveals no gallop and no friction rub.   No murmur heard. Pulmonary/Chest: Effort normal and breath sounds normal. She has no wheezes. She has no rales.  Abdominal: Soft. Bowel sounds are normal. She exhibits no distension and no mass.  Abdominal pain with palpation, some guarding. Mostly left mid abdominal area. Musculoskeletal: Normal range of motion. She exhibits no edema.  Lymphadenopathy:    She has no cervical adenopathy.  Neurological: She is alert and oriented to person, place, and time. She has normal reflexes.  No cranial nerve deficit. Gait normal. Coordination normal.  Skin: Skin is warm and dry. No rash noted.  Psychiatric: Mood, memory, affect and judgment normal.  Nursing note and vitals reviewed.  LABORATORY DATA:  I have reviewed the data as listed Lab Results  Component Value Date   WBC 6.5 10/18/2015   HGB 12.1 10/18/2015   HCT 35.8* 10/18/2015   MCV 90.2 10/18/2015   PLT 242 10/18/2015     Chemistry      Component Value Date/Time   NA 140 10/18/2015 1145   K 4.1 10/18/2015 1145   CL 104 10/18/2015 1145   CO2 26 10/18/2015 1145   BUN 13 10/18/2015 1145   CREATININE 0.84 10/18/2015 1145      Component Value Date/Time   CALCIUM 8.9 10/18/2015 1145   ALKPHOS 70 10/18/2015 1145   AST 20 10/18/2015 1145   ALT 22 10/18/2015 1145   BILITOT 0.4 10/18/2015 1145     RADIOLOGY: I have reviewed the images listed below and agree with the results  Study Result     CLINICAL DATA: Restaging stage IV endometrial cancer. Abnormal colonoscopy last week.  EXAM: CT CHEST, ABDOMEN, AND PELVIS WITH CONTRAST  TECHNIQUE: Multidetector CT imaging of the chest, abdomen and pelvis was performed following the standard protocol during bolus administration of intravenous contrast.  CONTRAST: 118mL OMNIPAQUE IOHEXOL 300 MG/ML SOLN  COMPARISON: 02/26/2015.  FINDINGS: CT CHEST FINDINGS  Mediastinum/Nodes: Right IJ Port-A-Cath terminates in the right atrium. No pathologically enlarged mediastinal, hilar or axillary lymph nodes. Heart size normal. No pericardial effusion.  Lungs/Pleura: Lungs are clear. No pleural fluid. Airway is unremarkable.  Musculoskeletal: No worrisome lytic or sclerotic lesions.  CT ABDOMEN PELVIS FINDINGS  Hepatobiliary: Liver and gallbladder are unremarkable. No biliary ductal dilatation.  Pancreas: Negative.  Spleen: Negative.  Adrenals/Urinary Tract: Adrenal glands and kidneys are unremarkable. Ureters are decompressed. Bladder is  unremarkable.  Stomach/Bowel: Stomach, small bowel and colon are unremarkable. Appendectomy. Note is made of stool is seen throughout the colon, indicative of constipation.  Vascular/Lymphatic: Atherosclerotic calcification of the arterial vasculature without abdominal aortic aneurysm. No pathologically enlarged lymph nodes.  Reproductive: Hysterectomy. Ovaries are not well-visualized.  Other: No free fluid. There may be a tiny left inguinal hernia containing  fat. Mesenteries and peritoneum are unremarkable.  Musculoskeletal: No worrisome lytic or sclerotic lesions. Mild degenerative changes are seen in the spine.  IMPRESSION: No evidence of metastatic disease.   Electronically Signed  By: Lorin Picket M.D.  On: 07/22/2015 12:33     PATHOLOGY:    ASSESSMENT & PLAN:  Stage IV Endometrial Carcinoma diagnosed at [facility] Nausea Chemotherapy induced neuropathy Tubular adenoma with focal high grade dysplasia on C-scope 07/04/2015 R Hemicolectomy with Julia Osborne on 08/30/2015 intramucosal adenocarcinoma of colon, 16 benign LN Genetics Referral 10/03/2015  Her only complaint today is a constant dull ache in her left abdominal area. She has seen Julia Osborne. Based upon her PE today I have ordered stat CT imaging of the abdomen. The patient was advised to wait for the results.   Labs were performed today, CA-125 is pending. She will be notified of the results once available.   She does not need any refills at this time.  We will tentatively schedule follow-up in 3 months. However, if CT results show an abnormality we will see her again sooner.  Orders Placed This Encounter  Procedures  . CT Abdomen Pelvis W Contrast    [facility]    MEDICAID/WORKING ON PAC      LABS DRAWN 01/10 OR 01/11         PT TO RETURN TO CLINIC/CALL REPORT         Standing Status: Future     Number of Occurrences: 1     Standing Expiration Date: 10/17/2016    Order Specific  Question:  If indicated for the ordered procedure, I authorize the administration of contrast media per Radiology protocol    Answer:  Yes    Order Specific Question:  Reason for Exam (SYMPTOM  OR DIAGNOSIS REQUIRED)    Answer:  severe L sided abdominal pain, history stage IV endometrial cancer, history CRC    Order Specific Question:  Is the patient pregnant?    Answer:  No    Order Specific Question:  Preferred imaging location?    Answer:  [facility]   All questions were answered. The patient knows to call the clinic with any problems, questions or concerns.  This document serves as a record of services personally performed by Ancil Linsey, MD. It was created on her behalf by Arlyce Harman, a trained medical scribe. The creation of this record is based on the scribe's personal observations and the provider's statements to them. This document has been checked and approved by the attending provider.  I have reviewed the above documentation for accuracy and completeness, and I agree with the above.  This note was electronically signed Kelby Fam. Satish Hammers MD

## 2015-10-19 LAB — CA 125: CA 125: 17.3 U/mL (ref 0.0–38.1)

## 2015-10-21 ENCOUNTER — Encounter: Payer: Self-pay | Admitting: Genetic Counselor

## 2015-10-21 ENCOUNTER — Telehealth: Payer: Self-pay | Admitting: Genetic Counselor

## 2015-10-21 DIAGNOSIS — Z1379 Encounter for other screening for genetic and chromosomal anomalies: Secondary | ICD-10-CM | POA: Insufficient documentation

## 2015-10-21 NOTE — Telephone Encounter (Signed)
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  Scheduled her for a results session on 2/9 at 2 PM.

## 2015-10-21 NOTE — Progress Notes (Signed)
CC'D TO PCP °

## 2015-10-31 ENCOUNTER — Encounter (HOSPITAL_COMMUNITY): Payer: Medicaid Other | Attending: Hematology & Oncology | Admitting: Genetic Counselor

## 2015-10-31 ENCOUNTER — Encounter (HOSPITAL_COMMUNITY): Payer: Self-pay | Admitting: Genetic Counselor

## 2015-10-31 DIAGNOSIS — Z809 Family history of malignant neoplasm, unspecified: Secondary | ICD-10-CM

## 2015-10-31 DIAGNOSIS — C541 Malignant neoplasm of endometrium: Secondary | ICD-10-CM | POA: Insufficient documentation

## 2015-10-31 DIAGNOSIS — Z8051 Family history of malignant neoplasm of kidney: Secondary | ICD-10-CM | POA: Diagnosis not present

## 2015-10-31 DIAGNOSIS — Z1379 Encounter for other screening for genetic and chromosomal anomalies: Secondary | ICD-10-CM

## 2015-10-31 DIAGNOSIS — Z808 Family history of malignant neoplasm of other organs or systems: Secondary | ICD-10-CM

## 2015-10-31 DIAGNOSIS — Z315 Encounter for genetic counseling: Secondary | ICD-10-CM

## 2015-10-31 DIAGNOSIS — C189 Malignant neoplasm of colon, unspecified: Secondary | ICD-10-CM

## 2015-10-31 DIAGNOSIS — Z800 Family history of malignant neoplasm of digestive organs: Secondary | ICD-10-CM

## 2015-10-31 DIAGNOSIS — Z1509 Genetic susceptibility to other malignant neoplasm: Secondary | ICD-10-CM | POA: Insufficient documentation

## 2015-10-31 NOTE — Progress Notes (Signed)
REFERRING PROVIDER: Carlynn Spry, NP [address], [place] [address]   Ancil Linsey, MD  PRIMARY PROVIDER:  Carlynn Spry, NP  PRIMARY REASON FOR VISIT:  1. Lynch syndrome   2. Genetic testing   3. Adenocarcinoma of colon ([place])   4. Endometrial cancer ([place])   5. Family history of cancer   6. Family history of kidney cancer     HPI:  Julia Osborne was previously seen in the [facility] clinic due to a personal and family of cancer and concerns regarding a hereditary predisposition to cancer. Please refer to our prior cancer genetics clinic note for more information regarding Julia Osborne's medical, social and family histories, and our assessment and recommendations, at the time. Julia Osborne's recent genetic test results were disclosed to her, as were recommendations warranted by these results. These results and recommendations are discussed in more detail below.  CANCER HISTORY:    Endometrial cancer ([place])   07/12/2014 Imaging CT C/A/P, pelvic adenopathy, multiple pulmonary nodules concerning for metastatic disease   07/30/2014 Initial Diagnosis Endometrial cancer   07/31/2014 Pathology Results endometrioid adenocarcinoma, G2, lymphovascular invasion present, positive pelvic lymph node and vaginal biopsy   07/31/2014 Definitive Surgery radical hysterectomy, BSO, pelvic lymph node dissection and vaginal biopsies   09/25/2014 Procedure Port-A-Cath placement in IR   10/18/2014 - 01/10/2015 Chemotherapy Carboplatin/Taxol. First cycle given at [facility].  S/P 6 cycles total, 5 cycles given at [facility].   07/04/2015 Procedure Colonoscopy by Dr. Gala Romney   07/04/2015 Pathology Results Colon, polyp(s), vicinity of hepatic flexure - TUBULAR ADENOMA WITH FOCAL HIGH GRADE DYSPLASIA.    Procedure Partial colectomy by Dr. Arnoldo Morale scheduled for 08/30/2015    Adenocarcinoma of colon [facility])   07/04/2015 Pathology Results Diagnosis 1. Colon, polyp(s), vicinity of hepatic flexure - TUBULAR ADENOMA WITH  FOCAL HIGH GRADE DYSPLASIA. - NO INVASIVE CARCINOMA. 2. Colon, polyp(s), sigmoid - HYPERPLASTIC POLYP. - NO DYSPLASIA OR MALIGNANCY.   07/04/2015 Procedure Colonoscopoy by Dr. Gala Romney- large polypoid colonic lesion.   08/30/2015 Definitive Surgery Dr. Arnoldo Morale- right segmental colon resection   08/30/2015 Pathology Results [id number] intramucosal adenocarcinoma of colon, 0.2 cm inding the laminal propria with negative resection margins and 0/17 lymph nodes.    FAMILY HISTORY:  We obtained a detailed, 4-generation family history.  Significant diagnoses are listed below: Family History  Problem Relation Age of Onset  . Other Mother     clot that went to heart, deceased age 59ss  . Heart attack Father     age 66s, deceased  . Stroke Father   . Cancer Father     "at death determined he was ate up with cancer"  . Hypertension Sister   . Kidney cancer Sister 80  . Diabetes Brother   . Hypertension Brother   . Endometriosis Daughter   . Heart attack Maternal Grandfather   . Diabetes Sister   . Colon cancer Neg Hx   . Brain cancer Paternal Aunt   . Cancer Paternal Uncle     NOS  . Cancer Paternal Uncle     NOS  . Cancer Paternal Uncle     NOS    The patient has two children, two full brothers one full sister and a maternal half sister. Her full sister was diagnosed with kidney cancer at age 26. This sister has a daughter who most likely had precancerous cells of the cervix. The patient's parents are both deceased. Her mother died at age 30 from a PE. Her father died  of a heart attack at 61. When an autopsy was performed it was found that he was "ate up with cancer". Her father had 7-8 siblings. One sister had a brain tumor and died. Three brothers all died of cancer, unknown which type to the patient. The patient's mother had one brother and one sister who are alive and cancer free. There is no other reported history of cancer. Patient's maternal ancestors are of [place] descent, and  paternal ancestors are of English descent. There is no reported Ashkenazi Jewish ancestry. There is no known consanguinity.  GENETIC TEST RESULTS: At the time of Julia Osborne's visit, we recommended she pursue genetic testing of a custom gene panel.  The custom gene panel offered by [facility] and includes sequencing and rearrangement analysis for the following 17 genes: APC, ATM, AXIN2, BMPR1A, BRCA1, BRCA2, CDH1, CHEK2, EPCAM, GREM, MLH1, MSH2, MSH6, MUTYH, PMS2, POLD1, POLE, PTEN, SMAD4, STK11, and TP53.   The report date is October 17, 2015.  Julia Osborne was called today with her genetic test results. Genetic testing identified a pathogenic gene mutation called MSH6 [id number], confirming the diagnosis of Lynch syndrome. A copy of the test report has been scanned into Epic for review.   SCREENING RECOMMENDATIONS: We discussed the implications of Lynch syndrome for Julia Osborne, and discussed who else in the family should have genetic testing. We recommended Julia Osborne follow management guidelines for Lynch syndrome; all of which are outlined below. These can be coordinated by Julia Osborne's GI doctor or her primary provider.   1.  Annual colonoscopy beginning at age 69-25 or 2-5 years prior to the earliest colon cancer diagnosis.   2. While there is no clear evidence to support screening for stomach and small bowel cancer, an upper endoscopy can be considered at 3-5 year intervals beginning at age 44-35. However, whether to have this screening is best determined by the gastroenterologist.   3.  Annual urinalysis beginning at age 33-30.  For women with Lynch syndrome, unlike the effective surveillance plan for colorectal cancer risk, there is no professional agreement regarding management for the increased risk of uterine and ovarian cancer. However, we are available to help women and their providers establish an individualized surveillance plan. It is also important for women to understand the following:    1. Women should seek medical attention if they experience abnormal vaginal bleeding.   2. Some providers may still recommend vaginal ultrasounds, uterine biopsies (for uterine cancer risk) and/or CA-125 analysis ( for ovarian cancer risk), even though these have not been shown to be effective.  3. A hysterectomy with removal of the ovaries and fallopian tubes should be considered once childbearing is completed (if planned).  FAMILY MEMBERS: Since we now know the mutation in Julia Osborne, we can test at-risk relatives to determine whether or not they have inherited the mutation and are at increased risk for cancer. We will be happy to meet with any of her family members or refer them to a genetic counselor in their local area. To locate genetic counselors in other cities, individuals can visit the website of the [employer] (ArtistMovie.[email or web address]) and Secretary/administrator for a Social worker by zip code.  Lastly, we discussed with Julia Osborne that she should remain in contact with [place] in cancer genetics on an annual basis so we can update Julia Osborne's personal and family histories and inform her of advances in cancer genetics that may be of benefit for the entire family. she also knows  to call us if we can be of any further assistance.  Karen P. Florene Glen, [place], [facility] Certified Genetic Counselor [email or web address] phone: [phone]

## 2015-11-28 ENCOUNTER — Other Ambulatory Visit: Payer: Self-pay

## 2015-11-29 MED ORDER — DEXLANSOPRAZOLE 60 MG PO CPDR: 60.0000 mg | DELAYED_RELEASE_CAPSULE | Freq: Every day | ORAL | Status: DC

## 2015-12-13 ENCOUNTER — Encounter (HOSPITAL_COMMUNITY): Payer: Self-pay

## 2015-12-13 ENCOUNTER — Encounter (HOSPITAL_COMMUNITY): Payer: Medicaid Other | Attending: Hematology & Oncology

## 2015-12-13 VITALS — BP 125/65 | HR 55 | Resp 18

## 2015-12-13 DIAGNOSIS — C541 Malignant neoplasm of endometrium: Secondary | ICD-10-CM | POA: Diagnosis not present

## 2015-12-13 DIAGNOSIS — Z452 Encounter for adjustment and management of vascular access device: Secondary | ICD-10-CM | POA: Diagnosis present

## 2015-12-13 DIAGNOSIS — Z95828 Presence of other vascular implants and grafts: Secondary | ICD-10-CM

## 2015-12-13 MED ORDER — HEPARIN SOD (PORK) LOCK FLUSH 100 UNIT/ML IV SOLN
500.0000 [IU] | Freq: Once | INTRAVENOUS | Status: AC
  Administered 2015-12-13: 500 [IU] via INTRAVENOUS
  Filled 2015-12-13: qty 5

## 2015-12-13 MED ORDER — TRAMADOL HCL 50 MG PO TABS: 50.0000 mg | ORAL_TABLET | Freq: Four times a day (QID) | ORAL | Status: DC | PRN

## 2015-12-13 MED ORDER — SODIUM CHLORIDE 0.9% FLUSH
10.0000 mL | INTRAVENOUS | Status: DC | PRN
  Administered 2015-12-13: 20 mL via INTRAVENOUS
  Filled 2015-12-13: qty 10

## 2015-12-13 MED ORDER — HEPARIN SOD (PORK) LOCK FLUSH 100 UNIT/ML IV SOLN
INTRAVENOUS | Status: AC
  Filled 2015-12-13: qty 5

## 2015-12-13 NOTE — Patient Instructions (Signed)
[place] at [facility] Discharge Instructions  RECOMMENDATIONS MADE BY THE CONSULTANT AND ANY TEST RESULTS WILL BE SENT TO YOUR REFERRING PHYSICIAN.  You received you port flush today. Follow up as scheduled.  Thank you for choosing [place] at [facility] to provide your oncology and hematology care.  To afford each patient quality time with our provider, please arrive at least 15 minutes before your scheduled appointment time.   Beginning January 23rd 2017 lab work for the [facility] will be done in the  Main lab at [facility] on 1st floor. If you have a lab appointment with the [facility] please come in thru the  Main Entrance and check in at the main information desk  You need to re-schedule your appointment should you arrive 10 or more minutes late.  We strive to give you quality time with our providers, and arriving late affects you and other patients whose appointments are after yours.  Also, if you no show three or more times for appointments you may be dismissed from the clinic at the providers discretion.     Again, thank you for choosing [facility].  Our hope is that these requests will decrease the amount of time that you wait before being seen by our physicians.       _____________________________________________________________  Should you have questions after your visit to [facility], please contact our office at [phone] between the hours of 8:30 a.m. and 4:30 p.m.  Voicemails left after 4:30 p.m. will not be returned until the following business day.  For prescription refill requests, have your pharmacy contact our office.         Resources For Cancer Patients and their Caregivers ? American Cancer Society: Can assist with transportation, wigs, general needs, runs Look Good Feel Better.        331-159-0542 ? Cancer Care: Provides financial assistance, online support groups,  medication/co-pay assistance.  1-800-813-HOPE (226)730-2604) ? [place] Assists [place] cancer patients and their families through emotional , educational and financial support.  3398340615 ? [place] DSS Where to apply for food stamps, Medicaid and utility assistance. 870-363-9110 ? RCATS: Transportation to medical appointments. 6015915190 ? Social Security Administration: May apply for disability if have a Stage IV cancer. [phone] ? [employer], Disability and Transit Services: Assists with nutrition, care and transit needs. [phone]

## 2015-12-13 NOTE — Progress Notes (Signed)
Julia Osborne presented for Portacath access and flush. Double lumen Portacath located right chest wall accessed with   2 H 20 needle. Good blood return present on both lumens Both lumens  flushed with 75ml NS and 500U/55ml Heparin and needles  Removed were  intact. Procedure without incident. Patient tolerated procedure well.

## 2015-12-30 ENCOUNTER — Encounter (HOSPITAL_COMMUNITY): Payer: Self-pay | Admitting: *Deleted

## 2015-12-30 ENCOUNTER — Emergency Department (HOSPITAL_COMMUNITY)
Admission: EM | Admit: 2015-12-30 | Discharge: 2015-12-30 | Disposition: A | Discharge status: Home / Self Care | Payer: Medicaid Other | Attending: Emergency Medicine | Admitting: Emergency Medicine

## 2015-12-30 ENCOUNTER — Emergency Department (HOSPITAL_COMMUNITY): Payer: Medicaid Other

## 2015-12-30 DIAGNOSIS — M542 Cervicalgia: Secondary | ICD-10-CM | POA: Insufficient documentation

## 2015-12-30 DIAGNOSIS — F329 Major depressive disorder, single episode, unspecified: Secondary | ICD-10-CM | POA: Diagnosis not present

## 2015-12-30 DIAGNOSIS — F17210 Nicotine dependence, cigarettes, uncomplicated: Secondary | ICD-10-CM | POA: Insufficient documentation

## 2015-12-30 DIAGNOSIS — J45901 Unspecified asthma with (acute) exacerbation: Secondary | ICD-10-CM | POA: Insufficient documentation

## 2015-12-30 DIAGNOSIS — R079 Chest pain, unspecified: Secondary | ICD-10-CM | POA: Diagnosis not present

## 2015-12-30 DIAGNOSIS — R05 Cough: Secondary | ICD-10-CM | POA: Diagnosis present

## 2015-12-30 MED ORDER — PREDNISONE 10 MG PO TABS: 40.0000 mg | ORAL_TABLET | Freq: Every day | ORAL | Status: DC

## 2015-12-30 MED ORDER — CETIRIZINE HCL 10 MG PO TABS: 10.0000 mg | ORAL_TABLET | Freq: Every day | ORAL | Status: DC

## 2015-12-30 MED ORDER — DM-GUAIFENESIN ER 30-600 MG PO TB12: 1.0000 | ORAL_TABLET | Freq: Two times a day (BID) | ORAL | Status: DC

## 2015-12-30 MED ORDER — ALBUTEROL SULFATE (2.5 MG/3ML) 0.083% IN NEBU
5.0000 mg | INHALATION_SOLUTION | Freq: Once | RESPIRATORY_TRACT | Status: AC
  Administered 2015-12-30: 5 mg via RESPIRATORY_TRACT
  Filled 2015-12-30: qty 6

## 2015-12-30 NOTE — ED Provider Notes (Signed)
CSN: [id number]     Arrival date & time 12/30/15  [id number] History  By signing my name below, I, Julia Osborne, attest that this documentation has been prepared under the direction and in the presence of Fredia Sorrow, MD. Electronically Signed: Judithann Sauger, ED Scribe. 12/30/2015. 10:22 AM.    Chief Complaint  Patient presents with  . Cough   Patient is a 55 y.o. female presenting with shortness of breath. The history is provided by the patient. No language interpreter was used.  Shortness of Breath Severity:  Moderate Onset quality:  Gradual Progression:  Improving Chronicity:  Recurrent Context: pollens   Relieved by:  None tried Worsened by:  Nothing tried Ineffective treatments:  None tried Associated symptoms: chest pain, cough, diaphoresis, headaches, neck pain and vomiting   Associated symptoms: no abdominal pain, no fever, no rash and no sore throat    HPI Comments: Julia Osborne is a 55 y.o. female with a hx of COPD and seasonal allergies who presents to the Emergency Department complaining of gradually improving multiple episodes of SOB onset yesterday. She reports associated persistent productive cough, post tussive vomiting, diaphoresis, congestion, HA, dizziness and left ear pain. She states that also has elbow swelling but attributes that to her fall that occurred last week. No alleviating factors noted. Pt has not tried any medications PTA. She denies any home O2 use but uses an albuterol inhaler at home. She states that she is unable to take Prednisone because it makes her very sick. Pt denies any fever, chills, ankle swelling, or diarrhea.    Past Medical History  Diagnosis Date  . Asthma   . Uterine fibroid   . COPD (chronic obstructive pulmonary disease) ([place])     no definite diagnosis  . Anemia   . Vaginal Pap smear, abnormal   . Indigestion   . Diverticulitis   . Vaginal dryness 05/16/2015  . Dyspareunia 05/16/2015  . GERD (gastroesophageal reflux  disease)   . Anxiety   . Depression   . Neuropathy ([facility])     feet and hands  . Cancer ([place])     endometrial; cancer cells in intestine  . Vaginal itching 07/16/2015  . Adenocarcinoma of colon ([place]) 09/13/2015  . Family history of cancer   . Family history of kidney cancer    Past Surgical History  Procedure Laterality Date  . Tubal ligation    . Appendectomy    . Abdominal hysterectomy    . Portacath placement Right 09/2014  . Colonoscopy with propofol N/A 03/14/2015    RMR: Internal hemorrhoids. colonic diverticulosis. Incomplete examination. Prepartation inadequate.  . Esophagogastroduodenoscopy (egd) with propofol N/A 03/14/2015    RMR: Mild erosive reflux esophagitis status post passage o f a Maloney dilator. Abnormal gastric mucosa of uncertain significance as described above. status post biopsy, benign  . Esophageal dilation N/A 03/14/2015    Procedure: ESOPHAGEAL DILATION;  Surgeon: Daneil Dolin, MD;  Location: [facility];  Service: Endoscopy;  Laterality: N/A;  Maloney 47  . Biopsy N/A 03/14/2015    Procedure: BIOPSY;  Surgeon: Daneil Dolin, MD;  Location: [facility];  Service: Endoscopy;  Laterality: N/A;  Gastric  . Colonoscopy with propofol N/A 07/04/2015    RMR: Colonic diverticulosis . Large polypoid lesion in the vicinity of the hepatic flexure status post saline-assisted piecmeal snare polypectomy  with ablation and tattooing as described. Sigmoid polyp removed as described above. sigmoid colon polyp hyperplastic, hepatic flexure polyp with TA with focal high grade dysplasia   .  Polypectomy N/A 07/04/2015    Procedure: POLYPECTOMY;  Surgeon: Daneil Dolin, MD;  Location: [facility];  Service: Endoscopy;  Laterality: N/A;  . Partial colectomy  08/30/2015    polyp with adenocarcinoma   Family History  Problem Relation Age of Onset  . Other Mother     clot that went to heart, deceased age 67ss  . Heart attack Father     age 31s, deceased  . Stroke Father   . Cancer Father      "at death determined he was ate up with cancer"  . Hypertension Sister   . Kidney cancer Sister 79  . Diabetes Brother   . Hypertension Brother   . Endometriosis Daughter   . Heart attack Maternal Grandfather   . Diabetes Sister   . Colon cancer Neg Hx   . Brain cancer Paternal Aunt   . Cancer Paternal Uncle     NOS  . Cancer Paternal Uncle     NOS  . Cancer Paternal Uncle     NOS   Social History  Substance Use Topics  . Smoking status: Current Every Day Smoker -- 0.50 packs/day for 25 years    Types: Cigarettes  . Smokeless tobacco: Never Used  . Alcohol Use: No   OB History    Gravida Para Term Preterm AB TAB SAB Ectopic Multiple Living   2 2        2      Review of Systems  Constitutional: Positive for diaphoresis. Negative for fever and chills.  HENT: Positive for congestion. Negative for rhinorrhea and sore throat.   Eyes: Negative for visual disturbance.  Respiratory: Positive for cough and shortness of breath.   Cardiovascular: Positive for chest pain.  Gastrointestinal: Positive for vomiting. Negative for nausea, abdominal pain and diarrhea.  Musculoskeletal: Positive for neck pain and neck stiffness. Negative for back pain and joint swelling.  Skin: Negative for rash.  Neurological: Positive for dizziness and headaches.  Hematological: Does not bruise/bleed easily.  All other systems reviewed and are negative.     Allergies  Codeine  Home Medications   Prior to Admission medications   Medication Sig Start Date End Date Taking? Authorizing Provider  albuterol (PROVENTIL HFA;VENTOLIN HFA) 108 (90 Base) MCG/ACT inhaler Inhale 2 puffs into the lungs every 2 (two) hours as needed for wheezing or shortness of breath (cough). 10/17/15   Orvil Feil, NP  ALPRAZolam Duanne Moron) 0.25 MG tablet Take 0.25 mg by mouth 3 (three) times daily as needed for anxiety.     Historical Provider, MD  cetirizine (ZYRTEC ALLERGY) 10 MG tablet Take 1 tablet (10 mg total) by mouth  daily. 12/30/15   Fredia Sorrow, MD  citalopram (CELEXA) 20 MG tablet Take 20 mg by mouth daily.    Historical Provider, MD  cyclobenzaprine (FLEXERIL) 10 MG tablet Take 1 tablet (10 mg total) by mouth 3 (three) times daily as needed for muscle spasms. 10/18/15   Baird Cancer, PA-C  dexlansoprazole (DEXILANT) 60 MG capsule Take 1 capsule (60 mg total) by mouth daily. 11/29/15   Mahala Menghini, PA-C  dextromethorphan-guaiFENesin (MUCINEX DM) 30-600 MG 12hr tablet Take 1 tablet by mouth 2 (two) times daily. 12/30/15   Fredia Sorrow, MD  fexofenadine-pseudoephedrine (ALLEGRA-D) 60-120 MG per tablet Take 1 tablet by mouth every 12 (twelve) hours. 04/02/15   Evalee Jefferson, PA-C  gabapentin (NEURONTIN) 300 MG capsule Take one tablet at night for 5 days, then 2 at night for 5 days, then  one in am, increase slowly to total of 1800 mg daily 06/28/15   Patrici Ranks, MD  HYDROcodone-acetaminophen (NORCO/VICODIN) 5-325 MG tablet Take 1-2 tablets by mouth every 4 (four) hours as needed for moderate pain. 09/02/15   Aviva Signs, MD  Linaclotide [facility]) 145 MCG CAPS capsule Take 1 capsule (145 mcg total) by mouth daily before breakfast. 06/05/15   Mahala Menghini, PA-C  omeprazole (PRILOSEC) 20 MG capsule Take 20 mg by mouth daily as needed. Reported on 10/17/2015    Historical Provider, MD  ondansetron (ZOFRAN) 8 MG tablet TAKE 1 TABLET BY MOUTH EVERY 8 HOURS AS NEEDED FOR NAUSEA. 09/20/15   Patrici Ranks, MD  predniSONE (DELTASONE) 10 MG tablet Take 4 tablets (40 mg total) by mouth daily. 12/30/15   Fredia Sorrow, MD  traMADol (ULTRAM) 50 MG tablet Take 1 tablet (50 mg total) by mouth every 6 (six) hours as needed. 12/13/15   Baird Cancer, PA-C  traZODone (DESYREL) 50 MG tablet Take 50 mg by mouth at bedtime.    Historical Provider, MD   BP 135/81 mmHg  Pulse 87  Temp(Src) 97.9 F (36.6 C) (Oral)  Resp 16  Ht 5\' 3"  (1.6 m)  Wt 63.504 kg  BMI 24.81 kg/m2  SpO2 98% Physical Exam   Constitutional: She is oriented to person, place, and time. She appears well-developed and well-nourished. No distress.  HENT:  Head: Normocephalic and atraumatic.  Oropharynx mildly dry  Eyes: Conjunctivae and EOM are normal. Pupils are equal, round, and reactive to light.  Eyes track normal Sclera clear  Neck: Neck supple. No tracheal deviation present.  Cardiovascular: Normal rate and regular rhythm.   Pulmonary/Chest: Effort normal. No respiratory distress. She has no wheezes.  Lungs clear bilaterally  Musculoskeletal: Normal range of motion.  Neurological: She is alert and oriented to person, place, and time.  Skin: Skin is warm and dry.  Psychiatric: She has a normal mood and affect. Her behavior is normal.  Nursing note and vitals reviewed.   ED Course  Procedures (including critical care time) DIAGNOSTIC STUDIES: Oxygen Saturation is 100% on RA, normal by my interpretation.    COORDINATION OF CARE: 10:18 AM- Pt advised of plan for treatment and pt agrees. Pt reports relief with the nebulizer treatment here.    Labs Review Labs Reviewed - No data to display  Imaging Review Dg Chest 2 View  12/30/2015  CLINICAL DATA:  Cough, congestion, vomiting this morning. EXAM: CHEST  2 VIEW COMPARISON:  08/26/2015 FINDINGS: The heart size and mediastinal contours are within normal limits. Both lungs are clear. The visualized skeletal structures are unremarkable. IMPRESSION: No active cardiopulmonary disease. Electronically Signed   By: Rolm Baptise M.D.   On: 12/30/2015 09:36     Fredia Sorrow, MD has personally reviewed and evaluated these images and lab results as part of his medical decision-making.   EKG Interpretation None      MDM   Final diagnoses:  Asthma exacerbation    wheezing has resolved. With albuterol Atrovent nebulizer here. Patient's had some difficulties with prednisone in the past but is willing to try it again. Doubt she has a true allergy to it. Also  will treat with 0 tech for current pollen allergy symptoms and Mucinex DM.   I personally performed the services described in this documentation, which was scribed in my presence. The recorded information has been reviewed and is accurate.     Fredia Sorrow, MD 12/30/15 1030

## 2015-12-30 NOTE — ED Notes (Signed)
Pt comes in with productive cough and nasal congestion starting yesterday morning. Pt oxygen 100 upon triage.

## 2015-12-30 NOTE — ED Notes (Signed)
Patient with no complaints at this time. Respirations even and unlabored. Skin warm/dry. Discharge instructions reviewed with patient at this time. Patient given opportunity to voice concerns/ask questions. Patient discharged at this time and left Emergency Department with steady gait.   

## 2015-12-30 NOTE — Discharge Instructions (Signed)
Continue usual albuterol inhaler 2 puffs every 6 hours. Trial of the prednisone again. Also Mucinex DM for cough and phlegm and a 0 tech for allergy symptoms. Return for any new or worse symptoms.

## 2015-12-30 NOTE — ED Notes (Signed)
MD at bedside. 

## 2016-01-14 ENCOUNTER — Ambulatory Visit: Payer: Self-pay | Admitting: Internal Medicine

## 2016-01-15 ENCOUNTER — Other Ambulatory Visit (HOSPITAL_COMMUNITY): Payer: Self-pay | Admitting: Emergency Medicine

## 2016-01-16 ENCOUNTER — Encounter (HOSPITAL_COMMUNITY): Payer: Medicaid Other | Attending: Hematology & Oncology

## 2016-01-16 ENCOUNTER — Encounter (HOSPITAL_COMMUNITY): Payer: Medicaid Other | Attending: Hematology & Oncology | Admitting: Hematology & Oncology

## 2016-01-16 ENCOUNTER — Encounter (HOSPITAL_COMMUNITY): Payer: Self-pay | Admitting: Hematology & Oncology

## 2016-01-16 VITALS — BP 141/71 | HR 93 | Temp 98.4°F | Resp 16 | Wt 153.0 lb

## 2016-01-16 DIAGNOSIS — C541 Malignant neoplasm of endometrium: Secondary | ICD-10-CM | POA: Diagnosis not present

## 2016-01-16 DIAGNOSIS — J329 Chronic sinusitis, unspecified: Secondary | ICD-10-CM | POA: Diagnosis not present

## 2016-01-16 DIAGNOSIS — Z720 Tobacco use: Secondary | ICD-10-CM

## 2016-01-16 DIAGNOSIS — J302 Other seasonal allergic rhinitis: Secondary | ICD-10-CM

## 2016-01-16 DIAGNOSIS — J40 Bronchitis, not specified as acute or chronic: Secondary | ICD-10-CM

## 2016-01-16 DIAGNOSIS — C189 Malignant neoplasm of colon, unspecified: Secondary | ICD-10-CM

## 2016-01-16 LAB — COMPREHENSIVE METABOLIC PANEL
ALBUMIN: 3.6 g/dL (ref 3.5–5.0)
ALK PHOS: 68 U/L (ref 38–126)
ALT: 11 U/L — AB (ref 14–54)
ANION GAP: 10 (ref 5–15)
AST: 15 U/L (ref 15–41)
BILIRUBIN TOTAL: 0.4 mg/dL (ref 0.3–1.2)
BUN: 9 mg/dL (ref 6–20)
CALCIUM: 8.9 mg/dL (ref 8.9–10.3)
CO2: 25 mmol/L (ref 22–32)
CREATININE: 0.91 mg/dL (ref 0.44–1.00)
Chloride: 104 mmol/L (ref 101–111)
GFR calc Af Amer: >60 mL/min (ref >60)
GFR calc non Af Amer: >60 mL/min (ref >60)
GLUCOSE: 104 mg/dL — AB (ref 65–99)
Potassium: 3.8 mmol/L (ref 3.5–5.1)
Sodium: 139 mmol/L (ref 135–145)
TOTAL PROTEIN: 7.6 g/dL (ref 6.5–8.1)

## 2016-01-16 LAB — CBC WITH DIFFERENTIAL/PLATELET
Basophils Absolute: 0 10*3/uL (ref 0.0–0.1)
Basophils Relative: 0 %
Eosinophils Absolute: 0 10*3/uL (ref 0.0–0.7)
Eosinophils Relative: 1 %
HEMATOCRIT: 34.1 % — AB (ref 36.0–46.0)
HEMOGLOBIN: 11.6 g/dL — AB (ref 12.0–15.0)
LYMPHS ABS: 1.9 10*3/uL (ref 0.7–4.0)
LYMPHS PCT: 23 %
MCH: 30.4 pg (ref 26.0–34.0)
MCHC: 34 g/dL (ref 30.0–36.0)
MCV: 89.3 fL (ref 78.0–100.0)
MONO ABS: 0.7 10*3/uL (ref 0.1–1.0)
MONOS PCT: 8 %
NEUTROS ABS: 5.9 10*3/uL (ref 1.7–7.7)
NEUTROS PCT: 68 %
Platelets: 290 10*3/uL (ref 150–400)
RBC: 3.82 MIL/uL — ABNORMAL LOW (ref 3.87–5.11)
RDW: 13.4 % (ref 11.5–15.5)
WBC: 8.6 10*3/uL (ref 4.0–10.5)

## 2016-01-16 MED ORDER — METHYLPREDNISOLONE 4 MG PO TBPK: ORAL_TABLET | ORAL | Status: DC

## 2016-01-16 MED ORDER — FEXOFENADINE-PSEUDOEPHED ER 60-120 MG PO TB12: 1.0000 | ORAL_TABLET | Freq: Two times a day (BID) | ORAL | Status: DC

## 2016-01-16 MED ORDER — ALBUTEROL SULFATE (2.5 MG/3ML) 0.083% IN NEBU: 2.5000 mg | INHALATION_SOLUTION | Freq: Four times a day (QID) | RESPIRATORY_TRACT | Status: DC | PRN

## 2016-01-16 MED ORDER — AZITHROMYCIN 250 MG PO TABS: ORAL_TABLET | ORAL | Status: DC

## 2016-01-16 NOTE — Patient Instructions (Signed)
[place] at [facility] Discharge Instructions  RECOMMENDATIONS MADE BY THE CONSULTANT AND ANY TEST RESULTS WILL BE SENT TO YOUR REFERRING PHYSICIAN.    Exam and discussion by Dr Whitney Muse today Prescription for nebulizer, z-pack,prednisone dose pack, and allegra D.  After you have finished taking the allegra D the you can take OTC allegra. Port flush in 4 weeks then every 8 weeks after that Labs in 3 months Return to see the doctor in 3 months Please call the clinic if you have any questions or concerns    Thank you for choosing [place] at [facility] to provide your oncology and hematology care.  To afford each patient quality time with our provider, please arrive at least 15 minutes before your scheduled appointment time.   Beginning January 23rd 2017 lab work for the [facility] will be done in the  Main lab at [facility] on 1st floor. If you have a lab appointment with the [facility] please come in thru the  Main Entrance and check in at the main information desk  You need to re-schedule your appointment should you arrive 10 or more minutes late.  We strive to give you quality time with our providers, and arriving late affects you and other patients whose appointments are after yours.  Also, if you no show three or more times for appointments you may be dismissed from the clinic at the providers discretion.     Again, thank you for choosing [facility].  Our hope is that these requests will decrease the amount of time that you wait before being seen by our physicians.       _____________________________________________________________  Should you have questions after your visit to [facility], please contact our office at [phone] between the hours of 8:30 a.m. and 4:30 p.m.  Voicemails left after 4:30 p.m. will not be returned until the following business day.  For prescription refill requests, have  your pharmacy contact our office.         Resources For Cancer Patients and their Caregivers ? American Cancer Society: Can assist with transportation, wigs, general needs, runs Look Good Feel Better.        250-763-5861 ? Cancer Care: Provides financial assistance, online support groups, medication/co-pay assistance.  1-800-813-HOPE (726) 648-7528) ? [place] Assists [place] cancer patients and their families through emotional , educational and financial support.  ([phone] 334-1461 ? [place] DSS Where to apply for food stamps, Medicaid and utility assistance. 509-666-3906 ? RCATS: Transportation to medical appointments. [phone] ? Social Security Administration: May apply for disability if have a Stage IV cancer. [phone] ? [employer], Disability and Transit Services: Assists with nutrition, care and transit needs. [phone]

## 2016-01-16 NOTE — Progress Notes (Signed)
[place] PROGRESS NOTE  CHIEF COMPLAINTS:  Stage IV Endometrial Cancer, probable pulmonary metastases. Radical hysterectomy, BSO, pelvic LN dissection and vaginal biopsies 07/31/2014, patient had extensive pelvic disease including positive LN and positive distal vaginal biopsy  CA-125 137.41 on 07/19/2014 CA-125 25.72 on 09/27/2014  (normal range 0-20.9U/ml)  Tubular adenoma with focal high grade dysplasia on C-scope 07/04/2015 R Hemicolectomy with Dr. Arnoldo Morale on 08/30/2015 intramucosal adenocarcinoma of colon, 16 benign LN Genetics Referral 10/03/2015  HISTORY OF PRESENTING ILLNESS:   Julia Osborne 55 y.o. female is here because of stage IV endometrial cancer. She has completed all therapy. Ms. Crummey is here alone today.   She has a sinus infection. She said that her head feels like it is full of cotton and her ears feel like they are full of water. She said that she cannot hear because of this. She has tried "everything" for this. She said that she does have allergy problems every year.   She went to the emergency room because she was having trouble breathing. They gave her breathing treatment and Prednisone. The Prednisone helped a little bit but she could not sleep. She was up for 3 days straight. She was also taking Zyrtec but this doesn't help as much. On review of the ED note she presented with SOB and wheezing, at time of d/c from the ED wheezing had resolved. She has an albuterol inhaler at home.   She has hardly smoked a pack of cigarettes in a week. Every time she tries to smoke she starts coughing and throws up.  She has had a very stressful month. She stated that April has not been her month. She is moving to [place], [place] and recently broke up with her boyfriend.   Her appetite is not good. She notes her bowels have not changed. She has some abdominal tenderness but notes she fell face forward and landed on her stomach after she tripped over her dog.       Endometrial cancer ([place])   07/12/2014 Imaging CT C/A/P, pelvic adenopathy, multiple pulmonary nodules concerning for metastatic disease   07/30/2014 Initial Diagnosis Endometrial cancer   07/31/2014 Pathology Results endometrioid adenocarcinoma, G2, lymphovascular invasion present, positive pelvic lymph node and vaginal biopsy   07/31/2014 Definitive Surgery radical hysterectomy, BSO, pelvic lymph node dissection and vaginal biopsies   09/25/2014 Procedure Port-A-Cath placement in IR   10/18/2014 - 01/10/2015 Chemotherapy Carboplatin/Taxol. First cycle given at [facility].  S/P 6 cycles total, 5 cycles [facility] at [facility].   07/04/2015 Procedure Colonoscopy by Dr. Gala Romney   07/04/2015 Pathology Results Colon, polyp(s), vicinity of hepatic flexure - TUBULAR ADENOMA WITH FOCAL HIGH GRADE DYSPLASIA.    Procedure Partial colectomy by Dr. Arnoldo Morale scheduled for 08/30/2015    Adenocarcinoma of colon [facility])   07/04/2015 Pathology Results Diagnosis 1. Colon, polyp(s), vicinity of hepatic flexure - TUBULAR ADENOMA WITH FOCAL HIGH GRADE DYSPLASIA. - NO INVASIVE CARCINOMA. 2. Colon, polyp(s), sigmoid - HYPERPLASTIC POLYP. - NO DYSPLASIA OR MALIGNANCY.   07/04/2015 Procedure Colonoscopoy by Dr. Gala Romney- large polypoid colonic lesion.   08/30/2015 Definitive Surgery Dr. Arnoldo Morale- right segmental colon resection   08/30/2015 Pathology Results [id number] intramucosal adenocarcinoma of colon, 0.2 cm inding the laminal propria with negative resection margins and 0/17 lymph nodes.     MEDICAL HISTORY:  Past Medical History  Diagnosis Date  . Asthma   . Uterine fibroid   . COPD (chronic obstructive pulmonary disease) ([place])     no definite diagnosis  .  Anemia   . Vaginal Pap smear, abnormal   . Indigestion   . Diverticulitis   . Vaginal dryness 05/16/2015  . Dyspareunia 05/16/2015  . GERD (gastroesophageal reflux disease)   . Anxiety   . Depression   . Neuropathy ([facility])     feet and hands  . Cancer ([place])     endometrial; cancer  cells in intestine  . Vaginal itching 07/16/2015  . Adenocarcinoma of colon ([place]) 09/13/2015  . Family history of cancer   . Family history of kidney cancer   . Lynch syndrome     SURGICAL HISTORY: Past Surgical History  Procedure Laterality Date  . Tubal ligation    . Appendectomy    . Abdominal hysterectomy    . Portacath placement Right 09/2014  . Colonoscopy with propofol N/A 03/14/2015    RMR: Internal hemorrhoids. colonic diverticulosis. Incomplete examination. Prepartation inadequate.  . Esophagogastroduodenoscopy (egd) with propofol N/A 03/14/2015    RMR: Mild erosive reflux esophagitis status post passage o f a Maloney dilator. Abnormal gastric mucosa of uncertain significance as described above. status post biopsy, benign  . Esophageal dilation N/A 03/14/2015    Procedure: ESOPHAGEAL DILATION;  Surgeon: Daneil Dolin, MD;  Location: [facility];  Service: Endoscopy;  Laterality: N/A;  Maloney 50  . Biopsy N/A 03/14/2015    Procedure: BIOPSY;  Surgeon: Daneil Dolin, MD;  Location: [facility];  Service: Endoscopy;  Laterality: N/A;  Gastric  . Colonoscopy with propofol N/A 07/04/2015    RMR: Colonic diverticulosis . Large polypoid lesion in the vicinity of the hepatic flexure status post saline-assisted piecmeal snare polypectomy  with ablation and tattooing as described. Sigmoid polyp removed as described above. sigmoid colon polyp hyperplastic, hepatic flexure polyp with TA with focal high grade dysplasia   . Polypectomy N/A 07/04/2015    Procedure: POLYPECTOMY;  Surgeon: Daneil Dolin, MD;  Location: [facility];  Service: Endoscopy;  Laterality: N/A;  . Partial colectomy  08/30/2015    polyp with adenocarcinoma    SOCIAL HISTORY: Social History   Social History  . Marital Status: Single    Spouse Name: N/A  . Number of Children: 2  . Years of Education: N/A   Occupational History  . Not on file.   Social History Main Topics  . Smoking status: Current Every Day Smoker -- 0.50  packs/day for 25 years    Types: Cigarettes  . Smokeless tobacco: Never Used  . Alcohol Use: No  . Drug Use: No  . Sexual Activity: Not Currently    Birth Control/ Protection: Surgical     Comment: hyst   Other Topics Concern  . Not on file   Social History Narrative  She was working, but now has applied for SSI and disability. She is now living with her niece. 2 children. 3 grandchildren, one is deceased. Divorced. Smoker 1/2 ppd. Trying to cut back.  Rare alcohol use.   FAMILY HISTORY: Family History  Problem Relation Age of Onset  . Other Mother     clot that went to heart, deceased age 69ss  . Heart attack Father     age 52s, deceased  . Stroke Father   . Cancer Father     "at death determined he was ate up with cancer"  . Hypertension Sister   . Kidney cancer Sister 32  . Diabetes Brother   . Hypertension Brother   . Endometriosis Daughter   . Heart attack Maternal Grandfather   . Diabetes Sister   .  Colon cancer Neg Hx   . Brain cancer Paternal Aunt   . Cancer Paternal Uncle     NOS  . Cancer Paternal Uncle     NOS  . Cancer Paternal Uncle     NOS   indicated that her mother is deceased. She indicated that her father is deceased. She indicated that both of her sisters are alive. She indicated that both of her brothers are alive. She indicated that her maternal grandmother is deceased. She indicated that her maternal grandfather is deceased. She indicated that her paternal grandmother is deceased. She indicated that her paternal grandfather is deceased. She indicated that her daughter is alive. She indicated that her son is alive. She indicated that her maternal aunt is alive. She indicated that her maternal uncle is alive. She indicated that her paternal aunt is deceased. She indicated that all of her three paternal uncles are deceased.    Mother deceased at 6 from embolus Father deceased at 42 from MI 2 sisters and 2 brothers, Oldest sister had nephrectomy for  RCC.   ALLERGIES:  is allergic to codeine.  MEDICATIONS:  Current Outpatient Prescriptions  Medication Sig Dispense Refill  . albuterol (PROVENTIL HFA;VENTOLIN HFA) 108 (90 Base) MCG/ACT inhaler Inhale 2 puffs into the lungs every 2 (two) hours as needed for wheezing or shortness of breath (cough). 1 Inhaler 3  . albuterol (PROVENTIL) (2.5 MG/3ML) 0.083% nebulizer solution Take 3 mLs (2.5 mg total) by nebulization every 6 (six) hours as needed for wheezing or shortness of breath. 75 mL 12  . ALPRAZolam (XANAX) 0.25 MG tablet Take 0.25 mg by mouth 3 (three) times daily as needed for anxiety.     Marland Kitchen azithromycin (ZITHROMAX) 250 MG tablet Take two tablets on day 1 then one tablet daily thereafter until gone 11 each 0  . cetirizine (ZYRTEC ALLERGY) 10 MG tablet Take 1 tablet (10 mg total) by mouth daily. 14 tablet 1  . citalopram (CELEXA) 20 MG tablet Take 20 mg by mouth daily.    . cyclobenzaprine (FLEXERIL) 10 MG tablet Take 1 tablet (10 mg total) by mouth 3 (three) times daily as needed for muscle spasms. 30 tablet 1  . dexlansoprazole (DEXILANT) 60 MG capsule Take 1 capsule (60 mg total) by mouth daily. 90 capsule 3  . dextromethorphan-guaiFENesin (MUCINEX DM) 30-600 MG 12hr tablet Take 1 tablet by mouth 2 (two) times daily. 14 tablet 1  . fexofenadine-pseudoephedrine (ALLEGRA-D) 60-120 MG 12 hr tablet Take 1 tablet by mouth every 12 (twelve) hours. 14 tablet 0  . gabapentin (NEURONTIN) 300 MG capsule Take one tablet at night for 5 days, then 2 at night for 5 days, then one in am, increase slowly to total of 1800 mg daily 180 capsule 3  . HYDROcodone-acetaminophen (NORCO/VICODIN) 5-325 MG tablet Take 1-2 tablets by mouth every 4 (four) hours as needed for moderate pain. 50 tablet 0  . Linaclotide (LINZESS) 145 MCG CAPS capsule Take 1 capsule (145 mcg total) by mouth daily before breakfast. 30 capsule 5  . methylPREDNISolone (MEDROL DOSEPAK) 4 MG TBPK tablet Use as directed 21 tablet 0  .  omeprazole (PRILOSEC) 20 MG capsule Take 20 mg by mouth daily as needed. Reported on 10/17/2015    . ondansetron (ZOFRAN) 8 MG tablet TAKE 1 TABLET BY MOUTH EVERY 8 HOURS AS NEEDED FOR NAUSEA. 30 tablet 0  . predniSONE (DELTASONE) 10 MG tablet Take 4 tablets (40 mg total) by mouth daily. 20 tablet 0  . traMADol (ULTRAM)  50 MG tablet Take 1 tablet (50 mg total) by mouth every 6 (six) hours as needed. 30 tablet 1  . traZODone (DESYREL) 50 MG tablet Take 50 mg by mouth at bedtime.     No current facility-administered medications for this visit.    Review of Systems  Constitutional: Negative for fever, chills, weight loss and malaise/fatigue.  HENT:  Positive for sinus infection, congestion Negative for congestion, hearing loss, nosebleeds, sore throat and tinnitus.   Feels like her head is full of cotton and her ears are full of water.    Eyes: Negative for blurred vision, double vision, pain and discharge.  Respiratory: Positive for cough. Negative for hemoptysis, sputum production, shortness of breath and wheezing. Cardiovascular: Negative for chest pain, palpitations, claudication, leg swelling and PND.  Gastrointestinal: Positive for abdominal pain. Negative for heartburn, diarrhea, constipation, blood in stool and melena.  Left-sided constant dull ache, last several weeks after a fall over her dog Genitourinary: Negative for dysuria, urgency, frequency and hematuria.  Musculoskeletal: Negative for myalgias, joint pain and falls.  Skin: Negative for itching and rash.  Neurological: Negative for dizziness, tingling, tremors, sensory change, speech change, focal weakness, seizures, loss of consciousness, weakness and headaches.  Endo/Heme/Allergies: Does not bruise/bleed easily.  Psychiatric/Behavioral: Negative for depression, suicidal ideas, memory loss and substance abuse. The patient is not nervous/anxious and does not have insomnia.    14 point review of systems was performed and is  negative except as detailed under history of present illness and above   PHYSICAL EXAMINATION:  ECOG PERFORMANCE STATUS: 0 - Asymptomatic  Filed Vitals:   01/16/16 1100  BP: 141/71  Pulse: 93  Temp: 98.4 F (36.9 C)  Resp: 16   Filed Weights   01/16/16 1100  Weight: 153 lb (69.4 kg)   Physical Exam  Constitutional: She is oriented to person, place, and time and well-developed, well-nourished, and in no distress. hair regrowth noted  Well groomed HENT:  Head: Normocephalic and atraumatic. TM's visualized bilaterally and clear Nose: Nose normal.  Mouth/Throat: Oropharynx is clear and moist. No oropharyngeal exudate.  Eyes: Conjunctivae and EOM are normal. Pupils are equal, round, and reactive to light. Right eye exhibits no discharge. Left eye exhibits no discharge. No scleral icterus.  Neck: Normal range of motion. Neck supple. No tracheal deviation present. No thyromegaly present.  Cardiovascular: Normal rate, regular rhythm and normal heart sounds.  Exam reveals no gallop and no friction rub.   No murmur heard. Pulmonary/Chest: Effort normal and breath sounds normal. She has no wheezes. She has no rales. Occasional rhonchi. Abdominal: Soft. Bowel sounds are normal. She exhibits no distension and no mass.  Musculoskeletal: Normal range of motion. She exhibits no edema.  Lymphadenopathy:    She has no cervical adenopathy.  Neurological: She is alert and oriented to person, place, and time. She has normal reflexes. No cranial nerve deficit. Gait normal. Coordination normal.  Skin: Skin is warm and dry. No rash noted.  Psychiatric: Mood, memory, affect and judgment normal.  Nursing note and vitals reviewed.  LABORATORY DATA:  I have reviewed the data as listed Lab Results  Component Value Date   WBC 8.6 01/16/2016   HGB 11.6* 01/16/2016   HCT 34.1* 01/16/2016   MCV 89.3 01/16/2016   PLT 290 01/16/2016     Chemistry      Component Value Date/Time   NA 139 01/16/2016  0947   K 3.8 01/16/2016 0947   CL 104 01/16/2016 0947  CO2 25 01/16/2016 0947   BUN 9 01/16/2016 0947   CREATININE 0.91 01/16/2016 0947      Component Value Date/Time   CALCIUM 8.9 01/16/2016 0947   ALKPHOS 68 01/16/2016 0947   AST 15 01/16/2016 0947   ALT 11* 01/16/2016 0947   BILITOT 0.4 01/16/2016 0947       RADIOLOGY: I have reviewed the images listed below and agree with the results  Study Result     CLINICAL DATA: Cough, congestion, vomiting this morning.  EXAM: CHEST 2 VIEW  COMPARISON: 08/26/2015  FINDINGS: The heart size and mediastinal contours are within normal limits. Both lungs are clear. The visualized skeletal structures are unremarkable.  IMPRESSION: No active cardiopulmonary disease.   Electronically Signed  By: Rolm Baptise M.D.  On: 12/30/2015 09:36   Study Result     CLINICAL DATA: Restaging stage IV endometrial cancer. Abnormal colonoscopy last week.  EXAM: CT CHEST, ABDOMEN, AND PELVIS WITH CONTRAST  TECHNIQUE: Multidetector CT imaging of the chest, abdomen and pelvis was performed following the standard protocol during bolus administration of intravenous contrast.  CONTRAST: 127mL OMNIPAQUE IOHEXOL 300 MG/ML SOLN  COMPARISON: 02/26/2015.  FINDINGS: CT CHEST FINDINGS  Mediastinum/Nodes: Right IJ Port-A-Cath terminates in the right atrium. No pathologically enlarged mediastinal, hilar or axillary lymph nodes. Heart size normal. No pericardial effusion.  Lungs/Pleura: Lungs are clear. No pleural fluid. Airway is unremarkable.  Musculoskeletal: No worrisome lytic or sclerotic lesions.  CT ABDOMEN PELVIS FINDINGS  Hepatobiliary: Liver and gallbladder are unremarkable. No biliary ductal dilatation.  Pancreas: Negative.  Spleen: Negative.  Adrenals/Urinary Tract: Adrenal glands and kidneys are unremarkable. Ureters are decompressed. Bladder is unremarkable.  Stomach/Bowel: Stomach, small  bowel and colon are unremarkable. Appendectomy. Note is made of stool is seen throughout the colon, indicative of constipation.  Vascular/Lymphatic: Atherosclerotic calcification of the arterial vasculature without abdominal aortic aneurysm. No pathologically enlarged lymph nodes.  Reproductive: Hysterectomy. Ovaries are not well-visualized.  Other: No free fluid. There may be a tiny left inguinal hernia containing fat. Mesenteries and peritoneum are unremarkable.  Musculoskeletal: No worrisome lytic or sclerotic lesions. Mild degenerative changes are seen in the spine.  IMPRESSION: No evidence of metastatic disease.   Electronically Signed  By: Lorin Picket M.D.  On: 07/22/2015 12:33     PATHOLOGY:    ASSESSMENT & PLAN:  Stage IV Endometrial Carcinoma diagnosed at [facility] Abuse Nausea Chemotherapy induced neuropathy Tubular adenoma with focal high grade dysplasia on C-scope 07/04/2015 R Hemicolectomy with Dr. Arnoldo Morale on 08/30/2015 intramucosal adenocarcinoma of colon, 16 benign LN Genetics Referral 10/03/2015 Bronchitis/Seasonal allergies  She remains without evidence of recurrence. CA-125 is pending. We will notify her of the results when available. Since she is moving I have offered her a referral to an oncologist closer to her new home. For now she wishes to stay at [facility].  She was given a prescription for Zithromax, medrol dose pak and allegra D.  I have asked her to call if her symptoms do not improve over the next several weeks.   Patient was again encouraged to discontinue smoking. We have discussed options for smoking cessation.   Orders Placed This Encounter  Procedures  . CBC with Differential    Standing Status: Future     Number of Occurrences:      Standing Expiration Date: 01/15/2017  . Comprehensive metabolic panel    Standing Status: Future     Number of Occurrences:      Standing Expiration Date: 01/15/2017  .  CA 125     Standing Status: Future     Number of Occurrences:      Standing Expiration Date: 01/15/2017    She will return in 3 months for a follow up.  All questions were answered. The patient knows to call the clinic with any problems, questions or concerns.  This document serves as a record of services personally performed by Ancil Linsey, MD. It was created on her behalf by Kandace Blitz, a trained medical scribe. The creation of this record is based on the scribe's personal observations and the provider's statements to them. This document has been checked and approved by the attending provider.  I have reviewed the above documentation for accuracy and completeness, and I agree with the above.  This note was electronically signed Kelby Fam. Penland MD

## 2016-01-17 LAB — CA 125: CA 125: 19.3 U/mL (ref 0.0–38.1)

## 2016-01-22 ENCOUNTER — Other Ambulatory Visit (HOSPITAL_COMMUNITY): Payer: Self-pay | Admitting: Emergency Medicine

## 2016-01-22 MED ORDER — ONDANSETRON HCL 8 MG PO TABS: ORAL_TABLET | ORAL | Status: DC

## 2016-01-23 ENCOUNTER — Other Ambulatory Visit (HOSPITAL_COMMUNITY): Payer: Self-pay | Admitting: Oncology

## 2016-01-23 DIAGNOSIS — C541 Malignant neoplasm of endometrium: Secondary | ICD-10-CM

## 2016-01-23 MED ORDER — ONDANSETRON HCL 8 MG PO TABS: ORAL_TABLET | ORAL | Status: DC

## 2016-02-03 ENCOUNTER — Telehealth: Payer: Self-pay | Admitting: Internal Medicine

## 2016-02-03 MED ORDER — DEXLANSOPRAZOLE 60 MG PO CPDR: 60.0000 mg | DELAYED_RELEASE_CAPSULE | Freq: Every day | ORAL | Status: DC

## 2016-02-03 NOTE — Telephone Encounter (Signed)
Pt is aware.  

## 2016-02-03 NOTE — Addendum Note (Signed)
Addended by: Gordy Levan, Quincie Haroon A on: 02/03/2016 01:45 PM   Modules accepted: Orders

## 2016-02-03 NOTE — Telephone Encounter (Signed)
Please notify the patient that the refill has been sent in to the new pharmacy per her request.

## 2016-02-03 NOTE — Telephone Encounter (Signed)
Routing to the refill box. 

## 2016-02-03 NOTE — Telephone Encounter (Signed)
Pt called to give [place] her new address and phone number. She is also changing pharmacys. New Pharmacy will be [facility] [address] [place], [place] [address] [phone] (pharmacy number).  She is needing a refill on her dexilant

## 2016-02-11 ENCOUNTER — Encounter (HOSPITAL_COMMUNITY): Payer: Medicaid Other | Attending: Hematology & Oncology

## 2016-02-11 ENCOUNTER — Encounter (HOSPITAL_COMMUNITY): Payer: Self-pay

## 2016-02-11 VITALS — BP 140/79 | HR 75 | Resp 18

## 2016-02-11 DIAGNOSIS — Z95828 Presence of other vascular implants and grafts: Secondary | ICD-10-CM

## 2016-02-11 DIAGNOSIS — C541 Malignant neoplasm of endometrium: Secondary | ICD-10-CM | POA: Diagnosis not present

## 2016-02-11 DIAGNOSIS — Z452 Encounter for adjustment and management of vascular access device: Secondary | ICD-10-CM | POA: Diagnosis not present

## 2016-02-11 MED ORDER — TRAMADOL HCL 50 MG PO TABS
50.0000 mg | ORAL_TABLET | Freq: Four times a day (QID) | ORAL | Status: DC | PRN
Start: 2016-02-11 — End: 2016-05-05

## 2016-02-11 MED ORDER — HEPARIN SOD (PORK) LOCK FLUSH 100 UNIT/ML IV SOLN
500.0000 [IU] | Freq: Once | INTRAVENOUS | Status: AC
  Administered 2016-02-11: 500 [IU] via INTRAVENOUS
  Filled 2016-02-11: qty 5

## 2016-02-11 MED ORDER — SODIUM CHLORIDE 0.9% FLUSH
10.0000 mL | INTRAVENOUS | Status: DC | PRN
  Administered 2016-02-11: 10 mL via INTRAVENOUS
  Filled 2016-02-11: qty 10

## 2016-02-11 NOTE — Progress Notes (Signed)
Julia Osborne presented for Portacath (double port) access and flush.  Proper placement of portacath confirmed by CXR.  Portacath located right chest wall accessed with  H 20 needle.  Good blood return present. Portacath flushed with 64ml NS and 500U/2ml Heparin and needle removed intact.  Procedure tolerated well and without incident.

## 2016-02-11 NOTE — Patient Instructions (Signed)
[facility] at [facility] Discharge Instructions  RECOMMENDATIONS MADE BY THE CONSULTANT AND ANY TEST RESULTS WILL BE SENT TO YOUR REFERRING PHYSICIAN.  Port flush today Return as scheduled  Thank you for choosing [facility] at [facility] to provide your oncology and hematology care.  To afford each patient quality time with our provider, please arrive at least 15 minutes before your scheduled appointment time.   Beginning January 23rd 2017 lab work for the Cancer Center will be done in the  Main lab at [facility] on 1st floor. If you have a lab appointment with the Cancer Center please come in thru the  Main Entrance and check in at the main information desk  You need to re-schedule your appointment should you arrive 10 or more minutes late.  We strive to give you quality time with our providers, and arriving late affects you and other patients whose appointments are after yours.  Also, if you no show three or more times for appointments you may be dismissed from the clinic at the providers discretion.     Again, thank you for choosing [facility].  Our hope is that these requests will decrease the amount of time that you wait before being seen by our physicians.       _____________________________________________________________  Should you have questions after your visit to [facility], please contact our office at [phone] between the hours of 8:30 a.m. and 4:30 p.m.  Voicemails left after 4:30 p.m. will not be returned until the following business day.  For prescription refill requests, have your pharmacy contact our office.         Resources For Cancer Patients and their Caregivers ? American Cancer Society: Can assist with transportation, wigs, general needs, runs Look Good Feel Better.        1-888-227-6333 ? Cancer Care: Provides financial assistance, online support groups, medication/co-pay  assistance.  1-800-813-HOPE (4673) ? [facility] Assists [place] cancer patients and their families through emotional , educational and financial support.  336-427-4357 ? [place] DSS Where to apply for food stamps, Medicaid and utility assistance. 336-342-1394 ? [facility]: Transportation to medical appointments. 336-347-2287 ? Social Security Administration: May apply for disability if have a Stage IV cancer. 336-342-7796 1-800-772-1213 ? [place] Aging, Disability and Transit Services: Assists with nutrition, care and transit needs. 336-349-2343  Cancer Center Support Programs: @10RELATIVEDAYS@ > Cancer Support Group  2nd Tuesday of the month 1pm-2pm, Journey Room  > Creative Journey  3rd Tuesday of the month 1130am-1pm, Journey Room  > Look Good Feel Better  1st Wednesday of the month 10am-12 noon, Journey Room (Call American Cancer Society to register 1-800-395-5775)   

## 2016-04-03 ENCOUNTER — Other Ambulatory Visit: Payer: Self-pay | Admitting: Adult Health

## 2016-04-03 DIAGNOSIS — Z1231 Encounter for screening mammogram for malignant neoplasm of breast: Secondary | ICD-10-CM

## 2016-04-08 NOTE — Progress Notes (Signed)
[facility] PROGRESS NOTE  CHIEF COMPLAINTS:  Stage IV Endometrial Cancer, probable pulmonary metastases. Radical hysterectomy, BSO, pelvic LN dissection and vaginal biopsies 07/31/2014, patient had extensive pelvic disease including positive LN and positive distal vaginal biopsy  CA-125 137.41 on 07/19/2014 CA-125 25.72 on 09/27/2014  (normal range 0-20.9U/ml)  Tubular adenoma with focal high grade dysplasia on C-scope 07/04/2015 R Hemicolectomy with Dr. Arnoldo Morale on 08/30/2015 intramucosal adenocarcinoma of colon, 16 benign LN Genetics Referral 10/03/2015    Endometrial cancer ([place])   07/12/2014 Imaging    CT C/A/P, pelvic adenopathy, multiple pulmonary nodules concerning for metastatic disease     07/30/2014 Initial Diagnosis    Endometrial cancer     07/31/2014 Pathology Results    endometrioid adenocarcinoma, G2, lymphovascular invasion present, positive pelvic lymph node and vaginal biopsy     07/31/2014 Definitive Surgery    radical hysterectomy, BSO, pelvic lymph node dissection and vaginal biopsies     09/25/2014 Procedure    Port-A-Cath placement in IR     10/18/2014 - 01/10/2015 Chemotherapy    Carboplatin/Taxol. First cycle given at [facility].  S/P 6 cycles total, 5 cycles given at [facility].     07/04/2015 Procedure    Colonoscopy by Dr. Gala Romney     07/04/2015 Pathology Results    Colon, polyp(s), vicinity of hepatic flexure - TUBULAR ADENOMA WITH FOCAL HIGH GRADE DYSPLASIA.      Procedure    Partial colectomy by Dr. Arnoldo Morale scheduled for 08/30/2015     10/31/2015 Genetic Testing    [facility] SYNDROME MSH6 Mutation     04/27/2016 Imaging    CT CAP- No acute process or evidence of metastatic disease in the chest. Right middle lobe pulmonary nodule is unchanged back to 11/20/2014, favoring a benign etiology      Adenocarcinoma of colon ([place])   07/04/2015 Pathology Results    Diagnosis 1. Colon, polyp(s), vicinity of hepatic flexure - TUBULAR ADENOMA WITH FOCAL HIGH GRADE  DYSPLASIA. - NO INVASIVE CARCINOMA. 2. Colon, polyp(s), sigmoid - HYPERPLASTIC POLYP. - NO DYSPLASIA OR MALIGNANCY.     07/04/2015 Procedure    Colonoscopoy by Dr. Gala Romney- large polypoid colonic lesion.     08/30/2015 Definitive Surgery    Dr. Arnoldo Morale- right segmental colon resection     08/30/2015 Pathology Results    [id number] intramucosal adenocarcinoma of colon, 0.2 cm inding the laminal propria with negative resection margins and 0/17 lymph nodes.      HISTORY OF PRESENTING ILLNESS:   Julia Osborne 55 y.o. female is here because of stage IV endometrial cancer and an intramucosal adenocarcinoma of the colon. She has completed all therapy. [name] is here alone today. She has been diagnosed with Lynch Syndrome.   She has a sinus infection. She said that her head feels like it is full of cotton and her ears feel like they are full of water. She said that she cannot hear because of this. She has tried "everything" for this. She said that she does have allergy problems every year.   She went to the emergency room because she was having trouble breathing. They gave her breathing treatment and Prednisone. The Prednisone helped a little bit but she could not sleep. She was up for 3 days straight. She was also taking Zyrtec but this doesn't help as much. On review of the ED note she presented with SOB and wheezing, at time of d/c from the ED wheezing had resolved. She has an albuterol inhaler at home. She  unfortunately continues to smoke and knows this is a problem and that she needs to quit.  However, since she has been sick she has hardly smoked a pack of cigarettes in a week. Every time she tries to smoke she starts coughing and throws up.  She has had a very stressful month. She stated that April has not been her month. She is moving to [place], [place] and recently broke up with her boyfriend. She does not want to transfer care at the present time. She wishes to remain at [facility].   Her appetite is  not good. She notes her bowels have not changed. She has some abdominal tenderness but notes she fell face forward and landed on her stomach after she tripped over her dog.   She is active. She has remodeled her new home.    MEDICAL HISTORY:  Past Medical History:  Diagnosis Date  . Adenocarcinoma of colon ([place]) 09/13/2015  . Anemia   . Anxiety   . Asthma   . Cancer ([place])    endometrial; cancer cells in intestine  . COPD (chronic obstructive pulmonary disease) ([place])    no definite diagnosis  . Depression   . Diverticulitis   . Dyspareunia 05/16/2015  . Family history of cancer   . Family history of kidney cancer   . GERD (gastroesophageal reflux disease)   . Indigestion   . Lynch syndrome   . Neuropathy ([facility])    feet and hands  . Uterine fibroid   . Vaginal dryness 05/16/2015  . Vaginal itching 07/16/2015  . Vaginal Pap smear, abnormal     SURGICAL HISTORY: Past Surgical History:  Procedure Laterality Date  . ABDOMINAL HYSTERECTOMY    . APPENDECTOMY    . BIOPSY N/A 03/14/2015   Procedure: BIOPSY;  Surgeon: Daneil Dolin, MD;  Location: [facility];  Service: Endoscopy;  Laterality: N/A;  Gastric  . COLONOSCOPY WITH PROPOFOL N/A 03/14/2015   RMR: Internal hemorrhoids. colonic diverticulosis. Incomplete examination. Prepartation inadequate.  . COLONOSCOPY WITH PROPOFOL N/A 07/04/2015   RMR: Colonic diverticulosis . Large polypoid lesion in the vicinity of the hepatic flexure status post saline-assisted piecmeal snare polypectomy  with ablation and tattooing as described. Sigmoid polyp removed as described above. sigmoid colon polyp hyperplastic, hepatic flexure polyp with TA with focal high grade dysplasia   . ESOPHAGEAL DILATION N/A 03/14/2015   Procedure: ESOPHAGEAL DILATION;  Surgeon: Daneil Dolin, MD;  Location: [facility];  Service: Endoscopy;  Laterality: N/A;  [place] 68  . ESOPHAGOGASTRODUODENOSCOPY (EGD) WITH PROPOFOL N/A 03/14/2015   RMR: Mild erosive reflux esophagitis  status post passage o f a [place] dilator. Abnormal gastric mucosa of uncertain significance as described above. status post biopsy, benign  . PARTIAL COLECTOMY  08/30/2015   polyp with adenocarcinoma  . POLYPECTOMY N/A 07/04/2015   Procedure: POLYPECTOMY;  Surgeon: Daneil Dolin, MD;  Location: [facility];  Service: Endoscopy;  Laterality: N/A;  . PORTACATH PLACEMENT Right 09/2014  . TUBAL LIGATION      SOCIAL HISTORY: Social History   Social History  . Marital status: Single    Spouse name: N/A  . Number of children: 2  . Years of education: N/A   Occupational History  . Not on file.   Social History Main Topics  . Smoking status: Current Every Day Smoker    Packs/day: 0.25    Years: 25.00    Types: Cigarettes  . Smokeless tobacco: Never Used  . Alcohol use No  . Drug use:  No  . Sexual activity: Not Currently    Birth control/ protection: Surgical     Comment: hyst   Other Topics Concern  . Not on file   Social History Narrative  . No narrative on file  She was working, but now has applied for SSI and disability. She is now living with her niece. 2 children. 3 grandchildren, one is deceased. Divorced. Smoker 1/2 ppd. Trying to cut back.  Rare alcohol use.   FAMILY HISTORY: Family History  Problem Relation Age of Onset  . Other Mother     clot that went to heart, deceased age 107ss  . Heart attack Father     age 6s, deceased  . Stroke Father   . Cancer Father     "at death determined he was ate up with cancer"  . Hypertension Sister   . Kidney cancer Sister 55  . Diabetes Brother   . Hypertension Brother   . Endometriosis Daughter   . Heart attack Maternal Grandfather   . Diabetes Sister   . Brain cancer Paternal Aunt   . Cancer Paternal Uncle     NOS  . Cancer Paternal Uncle     NOS  . Cancer Paternal Uncle     NOS  . Colon cancer Neg Hx    indicated that her mother is deceased. She indicated that her father is deceased. She indicated that both of her  sisters are alive. She indicated that both of her brothers are alive. She indicated that her maternal grandmother is deceased. She indicated that her maternal grandfather is deceased. She indicated that her paternal grandmother is deceased. She indicated that her paternal grandfather is deceased. She indicated that her daughter is alive. She indicated that her son is alive. She indicated that her maternal aunt is alive. She indicated that her maternal uncle is alive. She indicated that her paternal aunt is deceased. She indicated that all of her three paternal uncles are deceased. She indicated that the status of her neg hx is unknown.     Mother deceased at 58 from embolus Father deceased at 21 from MI 2 sisters and 2 brothers, Oldest sister had nephrectomy for RCC.   ALLERGIES:  is allergic to codeine.  MEDICATIONS:  Current Outpatient Prescriptions  Medication Sig Dispense Refill  . albuterol (PROVENTIL HFA;VENTOLIN HFA) 108 (90 Base) MCG/ACT inhaler Inhale 2 puffs into the lungs every 2 (two) hours as needed for wheezing or shortness of breath (cough). 1 Inhaler 3  . albuterol (PROVENTIL) (2.5 MG/3ML) 0.083% nebulizer solution Take 3 mLs (2.5 mg total) by nebulization every 6 (six) hours as needed for wheezing or shortness of breath. 75 mL 12  . ALPRAZolam (XANAX) 0.25 MG tablet Take 0.25 mg by mouth 3 (three) times daily as needed for anxiety.     . citalopram (CELEXA) 20 MG tablet Take 20 mg by mouth daily.    Marland Kitchen dexlansoprazole (DEXILANT) 60 MG capsule Take 1 capsule (60 mg total) by mouth daily. 90 capsule 3  . Linaclotide (LINZESS) 145 MCG CAPS capsule Take 1 capsule (145 mcg total) by mouth daily before breakfast. 30 capsule 5  . omeprazole (PRILOSEC) 20 MG capsule Take 20 mg by mouth daily as needed. Reported on 10/17/2015    . ondansetron (ZOFRAN) 8 MG tablet TAKE 1 TABLET BY MOUTH EVERY 8 HOURS AS NEEDED FOR NAUSEA. 30 tablet 3  . traMADol (ULTRAM) 50 MG tablet Take 1 tablet (50 mg  total) by mouth every 6 (six)  hours as needed. 30 tablet 1  . traZODone (DESYREL) 50 MG tablet Take 50 mg by mouth at bedtime.    Marland Kitchen loratadine (CLARITIN) 10 MG tablet Take 10 mg by mouth daily.    . ranitidine (ZANTAC) 150 MG tablet Take 1 tablet (150 mg total) by mouth at bedtime as needed for heartburn. 30 tablet 2   No current facility-administered medications for this visit.     Review of Systems  Constitutional: Negative for fever, chills, weight loss and malaise/fatigue.  HENT:  Positive for sinus infection, congestion Negative for congestion, hearing loss, nosebleeds, sore throat and tinnitus.   Feels like her head is full of cotton and her ears are full of water.    Eyes: Negative for blurred vision, double vision, pain and discharge.  Respiratory: Positive for cough. Negative for hemoptysis, sputum production, shortness of breath and wheezing. Cardiovascular: Negative for chest pain, palpitations, claudication, leg swelling and PND.  Gastrointestinal: Positive for abdominal pain. Negative for heartburn, diarrhea, constipation, blood in stool and melena.  Left-sided constant dull ache, last several weeks after a fall over her dog Genitourinary: Negative for dysuria, urgency, frequency and hematuria.  Musculoskeletal: Negative for myalgias, joint pain and falls.  Skin: Negative for itching and rash.  Neurological: Negative for dizziness, tingling, tremors, sensory change, speech change, focal weakness, seizures, loss of consciousness, weakness and headaches.  Endo/Heme/Allergies: Does not bruise/bleed easily.  Psychiatric/Behavioral: Negative for depression, suicidal ideas, memory loss and substance abuse. The patient is not nervous/anxious and does not have insomnia.    14 point review of systems was performed and is negative except as detailed under history of present illness and above   PHYSICAL EXAMINATION:  ECOG PERFORMANCE STATUS: 1 - Symptomatic but completely  ambulatory  Vitals:   04/09/16 1006  BP: 131/64  Pulse: 68  Resp: 16  Temp: 98.4 F (36.9 C)   Filed Weights   04/09/16 1006  Weight: 152 lb 3.2 oz (69 kg)   Physical Exam  Constitutional: She is oriented to person, place, and time and well-developed, well-nourished, and in no distress.  Well groomed HENT:  Head: Normocephalic and atraumatic. TM's visualized bilaterally and clear Nose: Nose normal.  Mouth/Throat: Oropharynx is clear and moist. No oropharyngeal exudate.  Eyes: Conjunctivae and EOM are normal. Pupils are equal, round, and reactive to light. Right eye exhibits no discharge. Left eye exhibits no discharge. No scleral icterus.  Neck: Normal range of motion. Neck supple. No tracheal deviation present. No thyromegaly present.  Cardiovascular: Normal rate, regular rhythm and normal heart sounds.  Exam reveals no gallop and no friction rub.   No murmur heard. Pulmonary/Chest: Effort normal and breath sounds normal. She has no wheezes. She has no rales. Occasional rhonchi, clear with a cough Abdominal: Soft. Bowel sounds are normal. She exhibits no distension and no mass.  No HSM.  Musculoskeletal: Normal range of motion. She exhibits no edema.  Lymphadenopathy:    She has no cervical adenopathy.  Neurological: She is alert and oriented to person, place, and time. She has normal reflexes. No cranial nerve deficit. Gait normal. Coordination normal.  Skin: Skin is warm and dry. No rash noted.  Psychiatric: Mood, memory, affect and judgment normal.  Nursing note and vitals reviewed.  LABORATORY DATA:  I have reviewed the data as listed Lab Results  Component Value Date   WBC 7.4 04/09/2016   HGB 12.2 04/09/2016   HCT 36.1 04/09/2016   MCV 89.6 04/09/2016   PLT 209 04/09/2016  Chemistry      Component Value Date/Time   NA 137 04/09/2016 1107   K 3.7 04/09/2016 1107   CL 105 04/09/2016 1107   CO2 25 04/09/2016 1107   BUN 11 04/09/2016 1107   CREATININE 0.81  04/09/2016 1107      Component Value Date/Time   CALCIUM 8.5 (L) 04/09/2016 1107   ALKPHOS 64 04/09/2016 1107   AST 16 04/09/2016 1107   ALT 16 04/09/2016 1107   BILITOT 0.4 04/09/2016 1107     Results for VERLA, BRYNGELSON (MRN [id number])   Ref. Range 02/08/2015 12:41 06/28/2015 11:39 10/18/2015 11:45 01/16/2016 09:47 04/09/2016 11:07  CA 125 Latest Ref Range: 0.0 - 38.1 U/mL 14.6 15.8 17.3 19.3 13.1    RADIOLOGY: I have reviewed the images listed below and agree with the results  Study Result     CLINICAL DATA: Cough, congestion, vomiting this morning.  EXAM: CHEST 2 VIEW  COMPARISON: 08/26/2015  FINDINGS: The heart size and mediastinal contours are within normal limits. Both lungs are clear. The visualized skeletal structures are unremarkable.  IMPRESSION: No active cardiopulmonary disease.   Electronically Signed  By: Rolm Baptise M.D.  On: 12/30/2015 09:36   PATHOLOGY:    ASSESSMENT & PLAN:  Stage IV Endometrial Carcinoma diagnosed at [facility] Abuse Nausea Chemotherapy induced neuropathy Tubular adenoma with focal high grade dysplasia on C-scope 07/04/2015 R Hemicolectomy with Dr. Arnoldo Morale on 08/30/2015 intramucosal adenocarcinoma of colon, 16 benign LN Bronchitis/Seasonal allergies Lynch Syndrome  She remains without evidence of recurrence. CA-125 is WNL, it was elevated at diagnosis. She has moved but wishes to stay at [facility].  She was given a prescription for Zithromax, medrol dose pak and allegra D.  I have asked her to call if her symptoms do not improve over the next several weeks.   Patient was again encouraged to discontinue smoking. We have discussed options for smoking cessation.   She is due for repeat imaging. CT scans have been ordered.   She will be due for screening C-scope in October, EGD was performed in 02/2015.   Orders Placed This Encounter  Procedures  . CT Abdomen Pelvis W Contrast    Standing Status:   Future     Number of Occurrences:   1    Standing Expiration Date:   04/09/2017    Order Specific Question:   If indicated for the ordered procedure, I authorize the administration of contrast media per Radiology protocol    Answer:   Yes    Order Specific Question:   Reason for Exam (SYMPTOM  OR DIAGNOSIS REQUIRED)    Answer:   stage IV endometrial carcinoma, restaging    Order Specific Question:   Is the patient pregnant?    Answer:   No    Order Specific Question:   Preferred imaging location?    Answer:   [facility]  . CT Chest W Contrast    Standing Status:   Future    Number of Occurrences:   1    Standing Expiration Date:   04/09/2017    Order Specific Question:   If indicated for the ordered procedure, I authorize the administration of contrast media per Radiology protocol    Answer:   Yes    Order Specific Question:   Reason for Exam (SYMPTOM  OR DIAGNOSIS REQUIRED)    Answer:   stage IV endometrial carcinoma, restaging    Order Specific Question:   Is the patient pregnant?    Answer:  No    Order Specific Question:   Preferred imaging location?    Answer:   [facility]  . CBC with Differential  . CBC with Differential    Standing Status:   Future    Standing Expiration Date:   04/09/2017  . Comprehensive metabolic panel    Standing Status:   Future    Standing Expiration Date:   04/09/2017  . CA 125    Standing Status:   Future    Standing Expiration Date:   04/09/2017  . Nerve conduction test    Bilateral UE hand numbness and pain    Standing Status:   Future    Standing Expiration Date:   04/09/2017    Order Specific Question:   Where should this test be performed?    Answer:   [facility]       She will return in 3 months for a follow up.  All questions were answered. The patient knows to call the clinic with any problems, questions or concerns.  This document serves as a record of services personally performed by Ancil Linsey, MD. It was created on her  behalf by Arlyce Harman, a trained medical scribe. The creation of this record is based on the scribe's personal observations and the provider's statements to them. This document has been checked and approved by the attending provider.  I have reviewed the above documentation for accuracy and completeness, and I agree with the above.  This note was electronically signed Kelby Fam. Penland MD

## 2016-04-09 ENCOUNTER — Encounter (HOSPITAL_COMMUNITY): Payer: Medicaid Other

## 2016-04-09 ENCOUNTER — Encounter (HOSPITAL_COMMUNITY): Payer: Medicaid Other | Attending: Hematology & Oncology | Admitting: Hematology & Oncology

## 2016-04-09 ENCOUNTER — Other Ambulatory Visit (HOSPITAL_COMMUNITY): Payer: Self-pay | Admitting: Hematology & Oncology

## 2016-04-09 ENCOUNTER — Ambulatory Visit (HOSPITAL_COMMUNITY)
Admission: RE | Admit: 2016-04-09 | Discharge: 2016-04-09 | Disposition: A | Discharge status: Home / Self Care | Payer: Medicaid Other | Source: Ambulatory Visit | Attending: Adult Health | Admitting: Adult Health

## 2016-04-09 ENCOUNTER — Encounter (HOSPITAL_COMMUNITY): Payer: Self-pay | Admitting: Hematology & Oncology

## 2016-04-09 ENCOUNTER — Telehealth (HOSPITAL_COMMUNITY): Payer: Self-pay | Admitting: Hematology & Oncology

## 2016-04-09 VITALS — BP 131/64 | HR 68 | Temp 98.4°F | Resp 16 | Wt 152.2 lb

## 2016-04-09 DIAGNOSIS — Z1231 Encounter for screening mammogram for malignant neoplasm of breast: Secondary | ICD-10-CM | POA: Insufficient documentation

## 2016-04-09 DIAGNOSIS — R202 Paresthesia of skin: Secondary | ICD-10-CM

## 2016-04-09 DIAGNOSIS — C541 Malignant neoplasm of endometrium: Secondary | ICD-10-CM | POA: Diagnosis present

## 2016-04-09 DIAGNOSIS — C189 Malignant neoplasm of colon, unspecified: Secondary | ICD-10-CM

## 2016-04-09 DIAGNOSIS — Z720 Tobacco use: Secondary | ICD-10-CM

## 2016-04-09 DIAGNOSIS — Z1509 Genetic susceptibility to other malignant neoplasm: Secondary | ICD-10-CM

## 2016-04-09 LAB — COMPREHENSIVE METABOLIC PANEL
ALBUMIN: 4.1 g/dL (ref 3.5–5.0)
ALK PHOS: 64 U/L (ref 38–126)
ALT: 16 U/L (ref 14–54)
ANION GAP: 7 (ref 5–15)
AST: 16 U/L (ref 15–41)
BUN: 11 mg/dL (ref 6–20)
CALCIUM: 8.5 mg/dL — AB (ref 8.9–10.3)
CHLORIDE: 105 mmol/L (ref 101–111)
CO2: 25 mmol/L (ref 22–32)
Creatinine, Ser: 0.81 mg/dL (ref 0.44–1.00)
GFR calc Af Amer: >60 mL/min (ref >60)
GFR calc non Af Amer: >60 mL/min (ref >60)
GLUCOSE: 110 mg/dL — AB (ref 65–99)
POTASSIUM: 3.7 mmol/L (ref 3.5–5.1)
SODIUM: 137 mmol/L (ref 135–145)
Total Bilirubin: 0.4 mg/dL (ref 0.3–1.2)
Total Protein: 7.3 g/dL (ref 6.5–8.1)

## 2016-04-09 LAB — CBC WITH DIFFERENTIAL/PLATELET
BASOS ABS: 0 10*3/uL (ref 0.0–0.1)
BASOS PCT: 0 %
Eosinophils Absolute: 0.1 10*3/uL (ref 0.0–0.7)
Eosinophils Relative: 2 %
HEMATOCRIT: 36.1 % (ref 36.0–46.0)
Hemoglobin: 12.2 g/dL (ref 12.0–15.0)
Lymphocytes Relative: 35 %
Lymphs Abs: 2.6 10*3/uL (ref 0.7–4.0)
MCH: 30.3 pg (ref 26.0–34.0)
MCHC: 33.8 g/dL (ref 30.0–36.0)
MCV: 89.6 fL (ref 78.0–100.0)
MONO ABS: 0.6 10*3/uL (ref 0.1–1.0)
Monocytes Relative: 8 %
NEUTROS ABS: 4.1 10*3/uL (ref 1.7–7.7)
NEUTROS PCT: 55 %
Platelets: 209 10*3/uL (ref 150–400)
RBC: 4.03 MIL/uL (ref 3.87–5.11)
RDW: 14.6 % (ref 11.5–15.5)
WBC: 7.4 10*3/uL (ref 4.0–10.5)

## 2016-04-09 MED ORDER — SODIUM CHLORIDE 0.9% FLUSH
20.0000 mL | INTRAVENOUS | Status: DC | PRN
  Administered 2016-04-09: 20 mL via INTRAVENOUS
  Filled 2016-04-09: qty 20

## 2016-04-09 MED ORDER — HEPARIN SOD (PORK) LOCK FLUSH 100 UNIT/ML IV SOLN
500.0000 [IU] | Freq: Once | INTRAVENOUS | Status: AC
  Administered 2016-04-09: 500 [IU] via INTRAVENOUS

## 2016-04-09 NOTE — Telephone Encounter (Signed)
Faxed nerve cond study to [facility]

## 2016-04-09 NOTE — Progress Notes (Signed)
Julia Osborne presented for Portacath access and flush. Proper placement of portacath confirmed by CXR. Portacath located right chest wall accessed with two H 20 needles, one in each side of double port. No blood return, but flushes well without difficulty or discomfort.   Portacath flushed with 87ml NS and 500U/66ml Heparin each side and needles removed intact. Procedure without incident. Patient tolerated procedure well.

## 2016-04-09 NOTE — Patient Instructions (Addendum)
[facility] at [facility] Discharge Instructions  RECOMMENDATIONS MADE BY THE CONSULTANT AND ANY TEST RESULTS WILL BE SENT TO YOUR REFERRING PHYSICIAN.  Exam done and seen today by Dr. Tora Kindred flush with labs today and port flush every 8 weeks Nerve conduction test to be done here. Scans to be done at [facility] Return to see the doctor in 3 months with labs prior to follow up Please call the clinic if you have any questions or concerns  Thank you for choosing [facility] at [facility] to provide your oncology and hematology care.  To afford each patient quality time with our provider, please arrive at least 15 minutes before your scheduled appointment time.   Beginning January 23rd 2017 lab work for the [facility] will be done in the  Main lab at [facility] on 1st floor. If you have a lab appointment with the [facility] please come in thru the  Main Entrance and check in at the main information desk  You need to re-schedule your appointment should you arrive 10 or more minutes late.  We strive to give you quality time with our providers, and arriving late affects you and other patients whose appointments are after yours.  Also, if you no show three or more times for appointments you may be dismissed from the clinic at the providers discretion.     Again, thank you for choosing [facility].  Our hope is that these requests will decrease the amount of time that you wait before being seen by our physicians.       _____________________________________________________________  Should you have questions after your visit to [facility], please contact our office at [phone] between the hours of 8:30 a.m. and 4:30 p.m.  Voicemails left after 4:30 p.m. will not be returned until the following business day.  For prescription refill requests, have your pharmacy contact our office.         Resources For Cancer  Patients and their Caregivers ? American Cancer Society: Can assist with transportation, wigs, general needs, runs Look Good Feel Better.        434-420-4353 ? Cancer Care: Provides financial assistance, online support groups, medication/co-pay assistance.  1-800-813-HOPE (616)855-5978) ? [facility] Assists [place] cancer patients and their families through emotional , educational and financial support.  4582753801 ? [place] DSS Where to apply for food stamps, Medicaid and utility assistance. (319)250-1577 ? [facility]: Transportation to medical appointments. 579-134-4515 ? Social Security Administration: May apply for disability if have a Stage IV cancer. 574-360-7093 918-611-8149 ? [employer], Disability and Transit Services: Assists with nutrition, care and transit needs. [facility] Support Programs: @10RELATIVEDAYS @ > Cancer Support Group  2nd Tuesday of the month 1pm-2pm, Journey Room  > Creative Journey  3rd Tuesday of the month 1130am-1pm, Journey Room  > Look Good Feel Better  1st Wednesday of the month 10am-12 noon, Journey Room (Call [facility] to register [phone])

## 2016-04-09 NOTE — Patient Instructions (Signed)
[facility] at [facility] Discharge Instructions  RECOMMENDATIONS MADE BY THE CONSULTANT AND ANY TEST RESULTS WILL BE SENT TO YOUR REFERRING PHYSICIAN.  Port flush today.    Thank you for choosing [facility] at [facility] to provide your oncology and hematology care.  To afford each patient quality time with our provider, please arrive at least 15 minutes before your scheduled appointment time.   Beginning January 23rd 2017 lab work for the Cancer Center will be done in the  Main lab at [place] on 1st floor. If you have a lab appointment with the Cancer Center please come in thru the  Main Entrance and check in at the main information desk  You need to re-schedule your appointment should you arrive 10 or more minutes late.  We strive to give you quality time with our providers, and arriving late affects you and other patients whose appointments are after yours.  Also, if you no show three or more times for appointments you may be dismissed from the clinic at the providers discretion.     Again, thank you for choosing [facility].  Our hope is that these requests will decrease the amount of time that you wait before being seen by our physicians.       _____________________________________________________________  Should you have questions after your visit to  Cancer Center, please contact our office at [phone] between the hours of 8:30 a.m. and 4:30 p.m.  Voicemails left after 4:30 p.m. will not be returned until the following business day.  For prescription refill requests, have your pharmacy contact our office.         Resources For Cancer Patients and their Caregivers ? American Cancer Society: Can assist with transportation, wigs, general needs, runs Look Good Feel Better.        1-888-227-6333 ? Cancer Care: Provides financial assistance, online support groups, medication/co-pay assistance.   1-800-813-HOPE (4673) ? [facility] Assists [place] cancer patients and their families through emotional , educational and financial support.  336-427-4357 ? [place] DSS Where to apply for food stamps, Medicaid and utility assistance. 336-342-1394 ? [facility]: Transportation to medical appointments. 336-347-2287 ? Social Security Administration: May apply for disability if have a Stage IV cancer. 336-342-7796 1-800-772-1213 ? [place] Aging, Disability and Transit Services: Assists with nutrition, care and transit needs. 336-349-2343  Cancer Center Support Programs: @10RELATIVEDAYS@ > Cancer Support Group  2nd Tuesday of the month 1pm-2pm, Journey Room  > Creative Journey  3rd Tuesday of the month 1130am-1pm, Journey Room  > Look Good Feel Better  1st Wednesday of the month 10am-12 noon, Journey Room (Call American Cancer Society to register 1-800-395-5775)    

## 2016-04-10 ENCOUNTER — Ambulatory Visit (HOSPITAL_COMMUNITY): Payer: Self-pay

## 2016-04-10 LAB — CA 125: CA 125: 13.1 U/mL (ref 0.0–38.1)

## 2016-04-20 ENCOUNTER — Ambulatory Visit: Admission: RE | Admit: 2016-04-20 | Payer: Medicaid Other | Source: Ambulatory Visit

## 2016-04-22 ENCOUNTER — Encounter: Payer: Self-pay | Admitting: Nurse Practitioner

## 2016-04-22 ENCOUNTER — Other Ambulatory Visit: Payer: Self-pay

## 2016-04-22 ENCOUNTER — Ambulatory Visit (INDEPENDENT_AMBULATORY_CARE_PROVIDER_SITE_OTHER): Payer: Medicaid Other | Admitting: Nurse Practitioner

## 2016-04-22 VITALS — BP 129/73 | HR 65 | Temp 98.3°F | Ht 63.0 in | Wt 150.2 lb

## 2016-04-22 DIAGNOSIS — R1314 Dysphagia, pharyngoesophageal phase: Secondary | ICD-10-CM | POA: Diagnosis not present

## 2016-04-22 DIAGNOSIS — K2100 Gastro-esophageal reflux disease with esophagitis, without bleeding: Secondary | ICD-10-CM

## 2016-04-22 DIAGNOSIS — K219 Gastro-esophageal reflux disease without esophagitis: Secondary | ICD-10-CM

## 2016-04-22 DIAGNOSIS — R1310 Dysphagia, unspecified: Secondary | ICD-10-CM

## 2016-04-22 DIAGNOSIS — R1012 Left upper quadrant pain: Secondary | ICD-10-CM

## 2016-04-22 MED ORDER — RANITIDINE HCL 150 MG PO TABS: 150.0000 mg | ORAL_TABLET | Freq: Every evening | ORAL | 2 refills | Status: DC | PRN

## 2016-04-22 NOTE — Assessment & Plan Note (Signed)
Patient with worsening dysphagia symptoms, already on Dexilant daily. Last endoscopy one year ago found erosive esophagitis. It is possible a portion of her Lepper upper quadrant pain is related to GERD, gastritis. At this point we will add Zantac 150 milligrams as needed in the evenings. We will repeat upper endoscopy as noted below. Return for follow-up in 3 months.

## 2016-04-22 NOTE — Patient Instructions (Signed)
1. Add Zantac 150 mg in the evenings as needed for worsening GERD/reflux symptoms. 2. We will schedule your procedure for you. 3. Return for follow-up in 3 months.

## 2016-04-22 NOTE — Progress Notes (Signed)
cc'ed to pcp °

## 2016-04-22 NOTE — Assessment & Plan Note (Signed)
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 It is been occurring for a few months. She notes she has been more active and shortly after surgery moved to another city including heavy lifting of boxes, packing, frequent movements. Given the amount of abdominal surgery she has had she could have scar tissue as etiology as well. In addition to surgical scar tissue, other differentials could include GERD, gastritis, esophagitis as noted above, over exertion, musculoskeletal. Her symptoms are not very intrusive on her life and she is able to remain active and oozing she would like to do. We will continue to monitor, return for follow-up in 3 months. Recent CT on file is reassuring.

## 2016-04-22 NOTE — Assessment & Plan Note (Signed)
Patient notes worsening dysphagia as of late. She had an upper endoscopy with dilation 1 year ago. Given her history of multiple cancers over the past few years I have a low threshold for rechecking upper endoscopy with possible dilation due to dysphagia. Recent CT of the abdomen and pelvis essentially normal which is very reassuring. Differentials include gastritis, gastric erosions, esophagitis, uncontrolled GERD, tumor or mass, stricture, web, ring. At this point we will add endoscopy with possible dilation to further evaluate. Return for follow-up in 3 months.

## 2016-04-22 NOTE — Progress Notes (Signed)
Referring Provider: [facility], [facility]* Primary Care Physician:  Wendie Simmer, MD Primary GI:  Dr. Gala Romney  Chief Complaint  Patient presents with  . Abdominal Pain    HPI:   Julia Osborne is a 55 y.o. female who presents for follow-up on abdominal pain and vomiting. Patient was last seen in our office 10/17/2015 for left upper quadrant abdominal pain. Noted extensive history status post recent hemicolectomy due to large colon polyp with pathology noting adenocarcinoma. At that time she complained of a several week history of vague left upper quadrant pain. Stat CT was recommended but she declined. ER/urgent care evaluation precautions given. CT of the abdomen and pelvis was completed on 10/18/2015 which noted status post partial bowel resection involving the terminal ileum, cecum, proximal ascending colon without definitive findings to suggest local recurrence of disease or metastatic disease in the abdomen or pelvis. Subpleural nodule in the right middle lobe unchanged from prior study  She is currently being seen by oncology for stage IV endometrial cancer and probable pulmonary metastasis with radical hysterectomy, BSO, pelvic lymph node dissection, vaginal biopsies with extensive pelvic disease including positive lymph nodes and positive distal vaginal biopsy. Also for tubular adenoma with focal high-grade dysplasia status post resection of adenocarcinoma of colon with 16 benign lymph nodes. All therapy completed per oncology.   Today she states vomiting has resolved. She still has the abdominal pain. Has been much more active lately and wonders if that's related. Did some moving a couple months ago and lots of lifting and packing. Pain is present but not limiting her lifestyle/activities. Main concern today is hiccups and dysphagia. Has to eat really slow and sit-up straight. Occasional regurgitation. Had EGD with dilation but also with esophageal erosions. Also with bitter taste,  esophageal burning, hiccups. Symptoms worse with greasy foods and has been avoiding fried foods. Also with acidic foods (OJ) and carbonated beverages. Denies NSAIDs and ASA powders. Stress worsens symptoms as well. Is currently on Dexilant daily, states she takes it once a day. Denies hematochezia, melena, N/V. Denies chest pain, dyspnea, lightheadedness, syncope, near syncope. Denies any other upper or lower GI symptoms.  Past Medical History:  Diagnosis Date  . Adenocarcinoma of colon ([place]) 09/13/2015  . Anemia   . Anxiety   . Asthma   . Cancer ([place])    endometrial; cancer cells in intestine  . COPD (chronic obstructive pulmonary disease) ([place])    no definite diagnosis  . Depression   . Diverticulitis   . Dyspareunia 05/16/2015  . Family history of cancer   . Family history of kidney cancer   . GERD (gastroesophageal reflux disease)   . Indigestion   . Lynch syndrome   . Neuropathy ([facility])    feet and hands  . Uterine fibroid   . Vaginal dryness 05/16/2015  . Vaginal itching 07/16/2015  . Vaginal Pap smear, abnormal     Past Surgical History:  Procedure Laterality Date  . ABDOMINAL HYSTERECTOMY    . APPENDECTOMY    . BIOPSY N/A 03/14/2015   Procedure: BIOPSY;  Surgeon: Daneil Dolin, MD;  Location: [facility];  Service: Endoscopy;  Laterality: N/A;  Gastric  . COLONOSCOPY WITH PROPOFOL N/A 03/14/2015   RMR: Internal hemorrhoids. colonic diverticulosis. Incomplete examination. Prepartation inadequate.  . COLONOSCOPY WITH PROPOFOL N/A 07/04/2015   RMR: Colonic diverticulosis . Large polypoid lesion in the vicinity of the hepatic flexure status post saline-assisted piecmeal snare polypectomy  with ablation and tattooing as  described. Sigmoid polyp removed as described above. sigmoid colon polyp hyperplastic, hepatic flexure polyp with TA with focal high grade dysplasia   . ESOPHAGEAL DILATION N/A 03/14/2015   Procedure: ESOPHAGEAL DILATION;  Surgeon: Daneil Dolin, MD;  Location: [facility];   Service: Endoscopy;  Laterality: N/A;  Maloney 56  . ESOPHAGOGASTRODUODENOSCOPY (EGD) WITH PROPOFOL N/A 03/14/2015   RMR: Mild erosive reflux esophagitis status post passage o f a Maloney dilator. Abnormal gastric mucosa of uncertain significance as described above. status post biopsy, benign  . PARTIAL COLECTOMY  08/30/2015   polyp with adenocarcinoma  . POLYPECTOMY N/A 07/04/2015   Procedure: POLYPECTOMY;  Surgeon: Daneil Dolin, MD;  Location: [facility];  Service: Endoscopy;  Laterality: N/A;  . PORTACATH PLACEMENT Right 09/2014  . TUBAL LIGATION      Current Outpatient Prescriptions  Medication Sig Dispense Refill  . albuterol (PROVENTIL HFA;VENTOLIN HFA) 108 (90 Base) MCG/ACT inhaler Inhale 2 puffs into the lungs every 2 (two) hours as needed for wheezing or shortness of breath (cough). 1 Inhaler 3  . albuterol (PROVENTIL) (2.5 MG/3ML) 0.083% nebulizer solution Take 3 mLs (2.5 mg total) by nebulization every 6 (six) hours as needed for wheezing or shortness of breath. 75 mL 12  . ALPRAZolam (XANAX) 0.25 MG tablet Take 0.25 mg by mouth 3 (three) times daily as needed for anxiety.     . citalopram (CELEXA) 20 MG tablet Take 20 mg by mouth daily.    Marland Kitchen dexlansoprazole (DEXILANT) 60 MG capsule Take 1 capsule (60 mg total) by mouth daily. 90 capsule 3  . Linaclotide (LINZESS) 145 MCG CAPS capsule Take 1 capsule (145 mcg total) by mouth daily before breakfast. 30 capsule 5  . loratadine (CLARITIN) 10 MG tablet Take 10 mg by mouth daily.    Marland Kitchen omeprazole (PRILOSEC) 20 MG capsule Take 20 mg by mouth daily as needed. Reported on 10/17/2015    . ondansetron (ZOFRAN) 8 MG tablet TAKE 1 TABLET BY MOUTH EVERY 8 HOURS AS NEEDED FOR NAUSEA. 30 tablet 3  . traMADol (ULTRAM) 50 MG tablet Take 1 tablet (50 mg total) by mouth every 6 (six) hours as needed. 30 tablet 1  . traZODone (DESYREL) 50 MG tablet Take 50 mg by mouth at bedtime.    . ranitidine (ZANTAC) 150 MG tablet Take 1 tablet (150 mg total) by mouth at  bedtime as needed for heartburn. 30 tablet 2   No current facility-administered medications for this visit.     Allergies as of 04/22/2016 - Review Complete 04/22/2016  Allergen Reaction Noted  . Codeine Rash 08/25/2011    Family History  Problem Relation Age of Onset  . Other Mother     clot that went to heart, deceased age 42ss  . Heart attack Father     age 48s, deceased  . Stroke Father   . Cancer Father     "at death determined he was ate up with cancer"  . Hypertension Sister   . Kidney cancer Sister 60  . Diabetes Brother   . Hypertension Brother   . Endometriosis Daughter   . Heart attack Maternal Grandfather   . Diabetes Sister   . Brain cancer Paternal Aunt   . Cancer Paternal Uncle     NOS  . Cancer Paternal Uncle     NOS  . Cancer Paternal Uncle     NOS  . Colon cancer Neg Hx     Social History   Social History  . Marital status:  Single    Spouse name: N/A  . Number of children: 2  . Years of education: N/A   Social History Main Topics  . Smoking status: Current Every Day Smoker    Packs/day: 0.25    Years: 25.00    Types: Cigarettes  . Smokeless tobacco: Never Used  . Alcohol use No  . Drug use: No  . Sexual activity: Not Currently    Birth control/ protection: Surgical     Comment: hyst   Other Topics Concern  . None   Social History Narrative  . None    Review of Systems: General: Negative for anorexia, weight loss, fever, chills, fatigue, weakness. ENT: Negative for hoarseness. Admits dysphagia. CV: Negative for chest pain, angina, palpitations, peripheral edema.  Respiratory: Negative for dyspnea at rest, cough, sputum, wheezing.  GI: See history of present illness. Endo: Negative for unusual weight change.  Heme: Negative for bruising or bleeding.   Physical Exam: BP 129/73   Pulse 65   Temp 98.3 F (36.8 C) (Oral)   Ht 5\' 3"  (1.6 m)   Wt 150 lb 3.2 oz (68.1 kg)   BMI 26.61 kg/m  General:   Alert and oriented.  Pleasant and cooperative. Well-nourished and well-developed.  Ears:  Normal auditory acuity. Cardiovascular:  S1, S2 present without murmurs appreciated. Extremities without clubbing or edema. Respiratory:  Clear to auscultation bilaterally. No wheezes, rales, or rhonchi. No distress.  Gastrointestinal:  +BS, soft, non-tender and non-distended. No HSM noted. No guarding or rebound. No masses appreciated.  Rectal:  Deferred  Musculoskalatal:  Symmetrical without gross deformities. Skin:  Midline abdominal surgical scar noted. Neurologic:  Alert and oriented x4;  grossly normal neurologically. Psych:  Alert and cooperative. Normal mood and affect. Heme/Lymph/Immune: No excessive bruising noted.    04/22/2016 12:24 PM   Disclaimer: This note was dictated with voice recognition software. Similar sounding words can inadvertently be transcribed and may not be corrected upon review.

## 2016-04-23 ENCOUNTER — Ambulatory Visit (HOSPITAL_COMMUNITY): Payer: Medicaid Other

## 2016-04-27 ENCOUNTER — Ambulatory Visit
Admission: RE | Admit: 2016-04-27 | Discharge: 2016-04-27 | Disposition: A | Discharge status: Home / Self Care | Payer: Medicaid Other | Source: Ambulatory Visit | Attending: Hematology & Oncology | Admitting: Hematology & Oncology

## 2016-04-27 DIAGNOSIS — R911 Solitary pulmonary nodule: Secondary | ICD-10-CM | POA: Diagnosis not present

## 2016-04-27 DIAGNOSIS — Z9049 Acquired absence of other specified parts of digestive tract: Secondary | ICD-10-CM | POA: Insufficient documentation

## 2016-04-27 DIAGNOSIS — Z90710 Acquired absence of both cervix and uterus: Secondary | ICD-10-CM | POA: Insufficient documentation

## 2016-04-27 DIAGNOSIS — R202 Paresthesia of skin: Secondary | ICD-10-CM | POA: Diagnosis not present

## 2016-04-27 DIAGNOSIS — C189 Malignant neoplasm of colon, unspecified: Secondary | ICD-10-CM | POA: Insufficient documentation

## 2016-04-27 DIAGNOSIS — C541 Malignant neoplasm of endometrium: Secondary | ICD-10-CM

## 2016-04-27 DIAGNOSIS — R935 Abnormal findings on diagnostic imaging of other abdominal regions, including retroperitoneum: Secondary | ICD-10-CM | POA: Diagnosis not present

## 2016-04-27 MED ORDER — IOPAMIDOL (ISOVUE-300) INJECTION 61%
100.0000 mL | Freq: Once | INTRAVENOUS | Status: AC | PRN
  Administered 2016-04-27: 100 mL via INTRAVENOUS

## 2016-05-05 ENCOUNTER — Other Ambulatory Visit (HOSPITAL_COMMUNITY): Payer: Self-pay | Admitting: Oncology

## 2016-05-05 ENCOUNTER — Encounter (HOSPITAL_COMMUNITY): Payer: Self-pay | Admitting: Hematology & Oncology

## 2016-05-18 ENCOUNTER — Telehealth: Payer: Self-pay | Admitting: Internal Medicine

## 2016-05-18 ENCOUNTER — Other Ambulatory Visit: Payer: Self-pay

## 2016-05-18 NOTE — Telephone Encounter (Signed)
Short Stay called to say that the procedure orders need to be put in. Pt has procedure scheduled with RMR tomorrow.

## 2016-05-18 NOTE — Telephone Encounter (Signed)
Orders was already in

## 2016-05-19 ENCOUNTER — Encounter (HOSPITAL_COMMUNITY): Payer: Self-pay | Admitting: *Deleted

## 2016-05-19 ENCOUNTER — Ambulatory Visit (HOSPITAL_COMMUNITY)
Admission: RE | Admit: 2016-05-19 | Discharge: 2016-05-19 | Disposition: A | Discharge status: Home / Self Care | Payer: Medicaid Other | Source: Ambulatory Visit | Attending: Internal Medicine | Admitting: Internal Medicine

## 2016-05-19 ENCOUNTER — Encounter (HOSPITAL_COMMUNITY)
Admission: RE | Disposition: A | Discharge status: Home / Self Care | Payer: Self-pay | Source: Ambulatory Visit | Attending: Internal Medicine

## 2016-05-19 DIAGNOSIS — Z9049 Acquired absence of other specified parts of digestive tract: Secondary | ICD-10-CM | POA: Diagnosis not present

## 2016-05-19 DIAGNOSIS — C541 Malignant neoplasm of endometrium: Secondary | ICD-10-CM | POA: Diagnosis not present

## 2016-05-19 DIAGNOSIS — R1310 Dysphagia, unspecified: Secondary | ICD-10-CM | POA: Diagnosis not present

## 2016-05-19 DIAGNOSIS — F329 Major depressive disorder, single episode, unspecified: Secondary | ICD-10-CM | POA: Diagnosis not present

## 2016-05-19 DIAGNOSIS — Z85038 Personal history of other malignant neoplasm of large intestine: Secondary | ICD-10-CM | POA: Diagnosis not present

## 2016-05-19 DIAGNOSIS — K449 Diaphragmatic hernia without obstruction or gangrene: Secondary | ICD-10-CM | POA: Insufficient documentation

## 2016-05-19 DIAGNOSIS — Z79899 Other long term (current) drug therapy: Secondary | ICD-10-CM | POA: Insufficient documentation

## 2016-05-19 DIAGNOSIS — J449 Chronic obstructive pulmonary disease, unspecified: Secondary | ICD-10-CM | POA: Insufficient documentation

## 2016-05-19 DIAGNOSIS — F419 Anxiety disorder, unspecified: Secondary | ICD-10-CM | POA: Diagnosis not present

## 2016-05-19 DIAGNOSIS — R066 Hiccough: Secondary | ICD-10-CM | POA: Diagnosis not present

## 2016-05-19 DIAGNOSIS — K219 Gastro-esophageal reflux disease without esophagitis: Secondary | ICD-10-CM | POA: Diagnosis not present

## 2016-05-19 DIAGNOSIS — F17210 Nicotine dependence, cigarettes, uncomplicated: Secondary | ICD-10-CM | POA: Insufficient documentation

## 2016-05-19 DIAGNOSIS — R109 Unspecified abdominal pain: Secondary | ICD-10-CM | POA: Insufficient documentation

## 2016-05-19 HISTORY — PX: MALONEY DILATION: SHX5535

## 2016-05-19 HISTORY — PX: ESOPHAGOGASTRODUODENOSCOPY: SHX5428

## 2016-05-19 SURGERY — EGD (ESOPHAGOGASTRODUODENOSCOPY)
Anesthesia: Moderate Sedation

## 2016-05-19 MED ORDER — LIDOCAINE VISCOUS 2 % MT SOLN
OROMUCOSAL | Status: DC | PRN
  Administered 2016-05-19: 4 mL via OROMUCOSAL

## 2016-05-19 MED ORDER — ONDANSETRON HCL 4 MG/2ML IJ SOLN
INTRAMUSCULAR | Status: AC
  Filled 2016-05-19: qty 2

## 2016-05-19 MED ORDER — SODIUM CHLORIDE 0.9 % IV SOLN
INTRAVENOUS | Status: DC
  Administered 2016-05-19: 10:00:00 via INTRAVENOUS

## 2016-05-19 MED ORDER — MEPERIDINE HCL 100 MG/ML IJ SOLN
INTRAMUSCULAR | Status: DC | PRN
  Administered 2016-05-19: 50 mg via INTRAVENOUS
  Administered 2016-05-19 (×2): 25 mg via INTRAVENOUS

## 2016-05-19 MED ORDER — MIDAZOLAM HCL 5 MG/5ML IJ SOLN
INTRAMUSCULAR | Status: AC
  Filled 2016-05-19: qty 10

## 2016-05-19 MED ORDER — LIDOCAINE VISCOUS 2 % MT SOLN
OROMUCOSAL | Status: AC
  Filled 2016-05-19: qty 15

## 2016-05-19 MED ORDER — MIDAZOLAM HCL 5 MG/5ML IJ SOLN
INTRAMUSCULAR | Status: DC | PRN
  Administered 2016-05-19 (×2): 2 mg via INTRAVENOUS
  Administered 2016-05-19 (×2): 1 mg via INTRAVENOUS

## 2016-05-19 MED ORDER — MEPERIDINE HCL 100 MG/ML IJ SOLN
INTRAMUSCULAR | Status: AC
  Filled 2016-05-19: qty 2

## 2016-05-19 MED ORDER — STERILE WATER FOR IRRIGATION IR SOLN
Status: DC | PRN
  Administered 2016-05-19: 100 mL

## 2016-05-19 NOTE — Interval H&P Note (Signed)
History and Physical Interval Note:  05/19/2016 9:42 AM  Julia Osborne  has presented today for surgery, with the diagnosis of GERD/DYSPHAGIA  The various methods of treatment have been discussed with the patient and family. After consideration of risks, benefits and other options for treatment, the patient has consented to  Procedure(s) with comments: ESOPHAGOGASTRODUODENOSCOPY (EGD) (N/A) - [address] (N/A) as a surgical intervention .  The patient's history has been reviewed, patient examined, no change in status, stable for surgery.  I have reviewed the patient's chart and labs.  Questions were answered to the patient's satisfaction.     Leaann Nevils  No change. EGD with ED as appropriate per plan. The risks, benefits, limitations, alternatives and imponderables have been reviewed with the patient. Potential for esophageal dilation, biopsy, etc. have also been reviewed.  Questions have been answered. All parties agreeable.

## 2016-05-19 NOTE — Op Note (Signed)
[facility] Patient Name: Julia Osborne Procedure Date: 05/19/2016 9:36 AM MRN: [id number] Date of Birth: 1960/12/12 Attending MD: Norvel Richards , MD CSN: [id number] Age: 55 Admit Type: Outpatient Procedure:                Upper GI endoscopy with [name] dilation Indications:              Dysphagia Providers:                Norvel Richards, MD, Rosina Lowenstein, RN, Sherlyn Lees, Technician Referring MD:              Medicines:                Midazolam 6 mg IV, Meperidine 100 mg IV,                            Ondansetron 4 mg IV Complications:            No immediate complications. Estimated Blood Loss:     Estimated blood loss: none. Estimated blood loss                            was minimal. Procedure:                Pre-Anesthesia Assessment:                           - Prior to the procedure, a History and Physical                            was performed, and patient medications and                            allergies were reviewed. The patient's tolerance of                            previous anesthesia was also reviewed. The risks                            and benefits of the procedure and the sedation                            options and risks were discussed with the patient.                            All questions were answered, and informed consent                            was obtained. Prior Anticoagulants: The patient has                            taken no previous anticoagulant or antiplatelet  agents. ASA Grade Assessment: II - A patient with                            mild systemic disease. After reviewing the risks                            and benefits, the patient was deemed in                            satisfactory condition to undergo the procedure.                           After obtaining informed consent, the endoscope was                            passed under direct vision.  Throughout the                            procedure, the patient's blood pressure, pulse, and                            oxygen saturations were monitored continuously. The                            [id number]) scope was introduced through the                            mouth, and advanced to the second part of duodenum.                            The patient tolerated the procedure well. The upper                            GI endoscopy was accomplished without difficulty.                            The patient tolerated the procedure well. Scope In: 9:58:08 AM Scope Out: 10:06:41 AM Total Procedure Duration: 0 hours 8 minutes 33 seconds  Findings:      The examined esophagus was normal.      A small hiatal hernia was present.      The exam was otherwise without abnormality.      The ampulla, duodenal bulb and second portion of the duodenum were       normal. Scope was removed and a 54 [place] Maloney dilator was passed a       full insertion easily. Subsequently, a 41 French maloney dilator was       passed from insertion easily.A look back revealed a small superficial       tear through the UES only. Impression:               - Normal esophagus. Status post Maloney dilation                           - Small hiatal hernia.                           -  The examination was otherwise normal.                           - Normal ampulla, duodenal bulb and second portion                            of the duodenum.                           - No specimens collected. Moderate Sedation:      Moderate (conscious) sedation was administered by the endoscopy nurse       and supervised by the endoscopist. The following parameters were       monitored: oxygen saturation, heart rate, blood pressure, and response       to care. Total physician intraservice time was 22 minutes. Recommendation:           - Patient has a contact number available for                            emergencies. The  signs and symptoms of potential                            delayed complications were discussed with the                            patient. Return to normal activities tomorrow.                            Written discharge instructions were provided to the                            patient.                           - Advance diet as tolerated.                           - Continue present medications.                           - Return to GI office in 12 weeks. Procedure Code(s):        --- Professional ---                           [id number], Esophagogastroduodenoscopy, flexible,                            transoral; diagnostic, including collection of                            specimen(s) by brushing or washing, when performed                            (separate procedure)                           99152,  Moderate sedation services provided by the                            same physician or other qualified health care                            professional performing the diagnostic or                            therapeutic service that the sedation supports,                            requiring the presence of an independent trained                            observer to assist in the monitoring of the                            patient's level of consciousness and physiological                            status; initial 15 minutes of intraservice time,                            patient age 33 years or older Diagnosis Code(s):        --- Professional ---                           K44.9, Diaphragmatic hernia without obstruction or                            gangrene                           R13.10, Dysphagia, unspecified CPT copyright 2016 American Medical Association. All rights reserved. The codes documented in this report are preliminary and upon coder review may  be revised to meet current compliance requirements. Cristopher Estimable. Zelene Barga, MD Norvel Richards, MD 05/19/2016 10:20:18  AM This report has been signed electronically. Number of Addenda: 0

## 2016-05-19 NOTE — Discharge Instructions (Addendum)
EGD Discharge instructions Please read the instructions outlined below and refer to this sheet in the next few weeks. These discharge instructions provide you with general information on caring for yourself after you leave the hospital. Your doctor may also give you specific instructions. While your treatment has been planned according to the most current medical practices available, unavoidable complications occasionally occur. If you have any problems or questions after discharge, please call your doctor.  Dr Gala Romney [phone]; after hours call the hospital and ask to have GI doctor on call call you back. ACTIVITY  You may resume your regular activity but move at a slower pace for the next 24 hours.   Take frequent rest periods for the next 24 hours.   Walking will help expel (get rid of) the air and reduce the bloated feeling in your abdomen.   No driving for 24 hours (because of the anesthesia (medicine) used during the test).   You may shower.   Do not sign any important legal documents or operate any machinery for 24 hours (because of the anesthesia used during the test).  NUTRITION  Drink plenty of fluids.   You may resume your normal diet.   Begin with a light meal and progress to your normal diet.   Avoid alcoholic beverages for 24 hours or as instructed by your caregiver.  MEDICATIONS  You may resume your normal medications unless your caregiver tells you otherwise.  WHAT YOU CAN EXPECT TODAY  You may experience abdominal discomfort such as a feeling of fullness or gas pains.  FOLLOW-UP  Your doctor will discuss the results of your test with you.  SEEK IMMEDIATE MEDICAL ATTENTION IF ANY OF THE FOLLOWING OCCUR:  Excessive nausea (feeling sick to your stomach) and/or vomiting.   Severe abdominal pain and distention (swelling).   Trouble swallowing.   Temperature over 101 F (37.8 C).   Rectal bleeding or vomiting of blood.    Continue Dexilant and ranitidine  daily  Office visit with [place] in 3 months

## 2016-05-19 NOTE — H&P (View-Only) (Signed)
Referring Provider: [facility], [facility]ud* Primary Care Physician:  Wendie Simmer, MD Primary GI:  Dr. Gala Romney  Chief Complaint  Patient presents with  . Abdominal Pain    HPI:   Julia Osborne is a 55 y.o. female who presents for follow-up on abdominal pain and vomiting. Patient was last seen in our office 10/17/2015 for left upper quadrant abdominal pain. Noted extensive history status post recent hemicolectomy due to large colon polyp with pathology noting adenocarcinoma. At that time she complained of a several week history of vague left upper quadrant pain. Stat CT was recommended but she declined. ER/urgent care evaluation precautions given. CT of the abdomen and pelvis was completed on 10/18/2015 which noted status post partial bowel resection involving the terminal ileum, cecum, proximal ascending colon without definitive findings to suggest local recurrence of disease or metastatic disease in the abdomen or pelvis. Subpleural nodule in the right middle lobe unchanged from prior study  She is currently being seen by oncology for stage IV endometrial cancer and probable pulmonary metastasis with radical hysterectomy, BSO, pelvic lymph node dissection, vaginal biopsies with extensive pelvic disease including positive lymph nodes and positive distal vaginal biopsy. Also for tubular adenoma with focal high-grade dysplasia status post resection of adenocarcinoma of colon with 16 benign lymph nodes. All therapy completed per oncology.   Today she states vomiting has resolved. She still has the abdominal pain. Has been much more active lately and wonders if that's related. Did some moving a couple months ago and lots of lifting and packing. Pain is present but not limiting her lifestyle/activities. Main concern today is hiccups and dysphagia. Has to eat really slow and sit-up straight. Occasional regurgitation. Had EGD with dilation but also with esophageal erosions. Also with bitter taste,  esophageal burning, hiccups. Symptoms worse with greasy foods and has been avoiding fried foods. Also with acidic foods (OJ) and carbonated beverages. Denies NSAIDs and ASA powders. Stress worsens symptoms as well. Is currently on Dexilant daily, states she takes it once a day. Denies hematochezia, melena, N/V. Denies chest pain, dyspnea, lightheadedness, syncope, near syncope. Denies any other upper or lower GI symptoms.  Past Medical History:  Diagnosis Date  . Adenocarcinoma of colon ([place]) 09/13/2015  . Anemia   . Anxiety   . Asthma   . Cancer ([place])    endometrial; cancer cells in intestine  . COPD (chronic obstructive pulmonary disease) ([place])    no definite diagnosis  . Depression   . Diverticulitis   . Dyspareunia 05/16/2015  . Family history of cancer   . Family history of kidney cancer   . GERD (gastroesophageal reflux disease)   . Indigestion   . Lynch syndrome   . Neuropathy ([facility])    feet and hands  . Uterine fibroid   . Vaginal dryness 05/16/2015  . Vaginal itching 07/16/2015  . Vaginal Pap smear, abnormal     Past Surgical History:  Procedure Laterality Date  . ABDOMINAL HYSTERECTOMY    . APPENDECTOMY    . BIOPSY N/A 03/14/2015   Procedure: BIOPSY;  Surgeon: Daneil Dolin, MD;  Location: [facility];  Service: Endoscopy;  Laterality: N/A;  Gastric  . COLONOSCOPY WITH PROPOFOL N/A 03/14/2015   RMR: Internal hemorrhoids. colonic diverticulosis. Incomplete examination. Prepartation inadequate.  . COLONOSCOPY WITH PROPOFOL N/A 07/04/2015   RMR: Colonic diverticulosis . Large polypoid lesion in the vicinity of the hepatic flexure status post saline-assisted piecmeal snare polypectomy  with ablation and tattooing as  described. Sigmoid polyp removed as described above. sigmoid colon polyp hyperplastic, hepatic flexure polyp with TA with focal high grade dysplasia   . ESOPHAGEAL DILATION N/A 03/14/2015   Procedure: ESOPHAGEAL DILATION;  Surgeon: Daneil Dolin, MD;  Location: [facility];   Service: Endoscopy;  Laterality: N/A;  Maloney 75  . ESOPHAGOGASTRODUODENOSCOPY (EGD) WITH PROPOFOL N/A 03/14/2015   RMR: Mild erosive reflux esophagitis status post passage o f a Maloney dilator. Abnormal gastric mucosa of uncertain significance as described above. status post biopsy, benign  . PARTIAL COLECTOMY  08/30/2015   polyp with adenocarcinoma  . POLYPECTOMY N/A 07/04/2015   Procedure: POLYPECTOMY;  Surgeon: Daneil Dolin, MD;  Location: [facility];  Service: Endoscopy;  Laterality: N/A;  . PORTACATH PLACEMENT Right 09/2014  . TUBAL LIGATION      Current Outpatient Prescriptions  Medication Sig Dispense Refill  . albuterol (PROVENTIL HFA;VENTOLIN HFA) 108 (90 Base) MCG/ACT inhaler Inhale 2 puffs into the lungs every 2 (two) hours as needed for wheezing or shortness of breath (cough). 1 Inhaler 3  . albuterol (PROVENTIL) (2.5 MG/3ML) 0.083% nebulizer solution Take 3 mLs (2.5 mg total) by nebulization every 6 (six) hours as needed for wheezing or shortness of breath. 75 mL 12  . ALPRAZolam (XANAX) 0.25 MG tablet Take 0.25 mg by mouth 3 (three) times daily as needed for anxiety.     . citalopram (CELEXA) 20 MG tablet Take 20 mg by mouth daily.    Marland Kitchen dexlansoprazole (DEXILANT) 60 MG capsule Take 1 capsule (60 mg total) by mouth daily. 90 capsule 3  . Linaclotide (LINZESS) 145 MCG CAPS capsule Take 1 capsule (145 mcg total) by mouth daily before breakfast. 30 capsule 5  . loratadine (CLARITIN) 10 MG tablet Take 10 mg by mouth daily.    Marland Kitchen omeprazole (PRILOSEC) 20 MG capsule Take 20 mg by mouth daily as needed. Reported on 10/17/2015    . ondansetron (ZOFRAN) 8 MG tablet TAKE 1 TABLET BY MOUTH EVERY 8 HOURS AS NEEDED FOR NAUSEA. 30 tablet 3  . traMADol (ULTRAM) 50 MG tablet Take 1 tablet (50 mg total) by mouth every 6 (six) hours as needed. 30 tablet 1  . traZODone (DESYREL) 50 MG tablet Take 50 mg by mouth at bedtime.    . ranitidine (ZANTAC) 150 MG tablet Take 1 tablet (150 mg total) by mouth at  bedtime as needed for heartburn. 30 tablet 2   No current facility-administered medications for this visit.     Allergies as of 04/22/2016 - Review Complete 04/22/2016  Allergen Reaction Noted  . Codeine Rash 08/25/2011    Family History  Problem Relation Age of Onset  . Other Mother     clot that went to heart, deceased age 75ss  . Heart attack Father     age 8s, deceased  . Stroke Father   . Cancer Father     "at death determined he was ate up with cancer"  . Hypertension Sister   . Kidney cancer Sister 65  . Diabetes Brother   . Hypertension Brother   . Endometriosis Daughter   . Heart attack Maternal Grandfather   . Diabetes Sister   . Brain cancer Paternal Aunt   . Cancer Paternal Uncle     NOS  . Cancer Paternal Uncle     NOS  . Cancer Paternal Uncle     NOS  . Colon cancer Neg Hx     Social History   Social History  . Marital status:  Single    Spouse name: N/A  . Number of children: 2  . Years of education: N/A   Social History Main Topics  . Smoking status: Current Every Day Smoker    Packs/day: 0.25    Years: 25.00    Types: Cigarettes  . Smokeless tobacco: Never Used  . Alcohol use No  . Drug use: No  . Sexual activity: Not Currently    Birth control/ protection: Surgical     Comment: hyst   Other Topics Concern  . None   Social History Narrative  . None    Review of Systems: General: Negative for anorexia, weight loss, fever, chills, fatigue, weakness. ENT: Negative for hoarseness. Admits dysphagia. CV: Negative for chest pain, angina, palpitations, peripheral edema.  Respiratory: Negative for dyspnea at rest, cough, sputum, wheezing.  GI: See history of present illness. Endo: Negative for unusual weight change.  Heme: Negative for bruising or bleeding.   Physical Exam: BP 129/73   Pulse 65   Temp 98.3 F (36.8 C) (Oral)   Ht 5\' 3"  (1.6 m)   Wt 150 lb 3.2 oz (68.1 kg)   BMI 26.61 kg/m  General:   Alert and oriented.  Pleasant and cooperative. Well-nourished and well-developed.  Ears:  Normal auditory acuity. Cardiovascular:  S1, S2 present without murmurs appreciated. Extremities without clubbing or edema. Respiratory:  Clear to auscultation bilaterally. No wheezes, rales, or rhonchi. No distress.  Gastrointestinal:  +BS, soft, non-tender and non-distended. No HSM noted. No guarding or rebound. No masses appreciated.  Rectal:  Deferred  Musculoskalatal:  Symmetrical without gross deformities. Skin:  Midline abdominal surgical scar noted. Neurologic:  Alert and oriented x4;  grossly normal neurologically. Psych:  Alert and cooperative. Normal mood and affect. Heme/Lymph/Immune: No excessive bruising noted.    04/22/2016 12:24 PM   Disclaimer: This note was dictated with voice recognition software. Similar sounding words can inadvertently be transcribed and may not be corrected upon review.

## 2016-05-21 ENCOUNTER — Encounter (HOSPITAL_COMMUNITY): Payer: Self-pay | Admitting: Internal Medicine

## 2016-06-04 ENCOUNTER — Encounter (HOSPITAL_COMMUNITY): Payer: Self-pay

## 2016-06-12 ENCOUNTER — Encounter (HOSPITAL_COMMUNITY): Payer: Medicaid Other | Attending: Hematology & Oncology

## 2016-06-12 ENCOUNTER — Encounter (HOSPITAL_COMMUNITY): Payer: Self-pay

## 2016-06-12 VITALS — BP 125/57 | HR 70 | Temp 98.3°F | Resp 16 | Wt 157.4 lb

## 2016-06-12 DIAGNOSIS — Z23 Encounter for immunization: Secondary | ICD-10-CM | POA: Diagnosis not present

## 2016-06-12 DIAGNOSIS — Z95828 Presence of other vascular implants and grafts: Secondary | ICD-10-CM

## 2016-06-12 DIAGNOSIS — C541 Malignant neoplasm of endometrium: Secondary | ICD-10-CM | POA: Insufficient documentation

## 2016-06-12 MED ORDER — INFLUENZA VAC SPLIT QUAD 0.5 ML IM SUSY
PREFILLED_SYRINGE | INTRAMUSCULAR | Status: AC
  Filled 2016-06-12: qty 0.5

## 2016-06-12 MED ORDER — SODIUM CHLORIDE 0.9% FLUSH
10.0000 mL | Freq: Once | INTRAVENOUS | Status: AC
  Administered 2016-06-12: 10 mL via INTRAVENOUS

## 2016-06-12 MED ORDER — INFLUENZA VAC SPLIT QUAD 0.5 ML IM SUSY
0.5000 mL | PREFILLED_SYRINGE | Freq: Once | INTRAMUSCULAR | Status: AC
  Administered 2016-06-12: 0.5 mL via INTRAMUSCULAR

## 2016-06-12 MED ORDER — HEPARIN SOD (PORK) LOCK FLUSH 100 UNIT/ML IV SOLN
INTRAVENOUS | Status: AC
  Filled 2016-06-12: qty 5

## 2016-06-12 MED ORDER — HEPARIN SOD (PORK) LOCK FLUSH 100 UNIT/ML IV SOLN
500.0000 [IU] | Freq: Once | INTRAVENOUS | Status: AC
  Administered 2016-06-12: 500 [IU] via INTRAVENOUS
  Filled 2016-06-12: qty 5

## 2016-06-12 NOTE — Progress Notes (Signed)
Julia Osborne presented for Portacath access and flush. Proper placement of portacath confirmed by CXR. Double lumen Portacath located right chest wall accessed with  H 20 needle both left and right side of port. No blood return from either side of double port. Multiple saline flushes without pain or discomfort still resulted in no blood return. Each side double lumen Portacath flushed with 84ml NS and 500U/45ml Heparin and needle removed intact. Procedure without incident. Patient tolerated procedure well.

## 2016-06-12 NOTE — Patient Instructions (Signed)
[address] at [facility] Discharge Instructions  RECOMMENDATIONS MADE BY THE CONSULTANT AND ANY TEST RESULTS WILL BE SENT TO YOUR REFERRING PHYSICIAN.  Port flush today. Flu shot given today . Follow up as scheduled  Thank you for choosing [facility] at [facility] to provide your oncology and hematology care.  To afford each patient quality time with our provider, please arrive at least 15 minutes before your scheduled appointment time.   Beginning January 23rd 2017 lab work for the [facility] will be done in the  Main lab at [facility] on 1st floor. If you have a lab appointment with the [facility] please come in thru the  Main Entrance and check in at the main information desk  You need to re-schedule your appointment should you arrive 10 or more minutes late.  We strive to give you quality time with our providers, and arriving late affects you and other patients whose appointments are after yours.  Also, if you no show three or more times for appointments you may be dismissed from the clinic at the providers discretion.     Again, thank you for choosing [facility].  Our hope is that these requests will decrease the amount of time that you wait before being seen by our physicians.       _____________________________________________________________  Should you have questions after your visit to [facility], please contact our office at [phone] between the hours of 8:30 a.m. and 4:30 p.m.  Voicemails left after 4:30 p.m. will not be returned until the following business day.  For prescription refill requests, have your pharmacy contact our office.         Resources For Cancer Patients and their Caregivers ? American Cancer Society: Can assist with transportation, wigs, general needs, runs Look Good Feel Better.        (867) 668-7035 ? Cancer Care: Provides financial assistance, online support  groups, medication/co-pay assistance.  1-800-813-HOPE 612 017 1391) ? [facility] Assists [place] cancer patients and their families through emotional , educational and financial support.  737-617-5381 ? [place] DSS Where to apply for food stamps, Medicaid and utility assistance. (325)298-3873 ? [facility]: Transportation to medical appointments. 337-799-8251 ? Social Security Administration: May apply for disability if have a Stage IV cancer. 949-751-5232 (276) 018-4600 ? [employer], Disability and Transit Services: Assists with nutrition, care and transit needs. [facility] Support Programs: @10RELATIVEDAYS @ > Cancer Support Group  2nd Tuesday of the month 1pm-2pm, Journey Room  > Creative Journey  3rd Tuesday of the month 1130am-1pm, Journey Room  > Look Good Feel Better  1st Wednesday of the month 10am-12 noon, Journey Room (Call [facility] to register [phone])

## 2016-07-10 ENCOUNTER — Other Ambulatory Visit (HOSPITAL_COMMUNITY): Payer: Self-pay

## 2016-07-10 ENCOUNTER — Ambulatory Visit (HOSPITAL_COMMUNITY): Payer: Self-pay | Admitting: Hematology & Oncology

## 2016-07-11 ENCOUNTER — Other Ambulatory Visit (HOSPITAL_COMMUNITY): Payer: Self-pay | Admitting: Oncology

## 2016-07-23 ENCOUNTER — Encounter: Payer: Self-pay | Admitting: Nurse Practitioner

## 2016-07-23 ENCOUNTER — Ambulatory Visit (INDEPENDENT_AMBULATORY_CARE_PROVIDER_SITE_OTHER): Payer: Medicaid Other | Admitting: Nurse Practitioner

## 2016-07-23 VITALS — BP 131/88 | HR 79 | Temp 97.8°F | Ht 63.0 in | Wt 156.4 lb

## 2016-07-23 DIAGNOSIS — R1314 Dysphagia, pharyngoesophageal phase: Secondary | ICD-10-CM | POA: Diagnosis not present

## 2016-07-23 DIAGNOSIS — K2100 Gastro-esophageal reflux disease with esophagitis, without bleeding: Secondary | ICD-10-CM

## 2016-07-23 NOTE — Progress Notes (Signed)
Referring Provider: Wendie Simmer, MD Primary Care Physician:  Wendie Simmer, MD Primary GI:  Dr. Gala Romney  Chief Complaint  Patient presents with  . Follow-up    HPI:   Julia Osborne is a 55 y.o. female who presents For follow-up post procedure. The patient was last seen in our office 04-2016 for GERD, dysphagia, and abdominal pain. At that time she was currently being seen by oncology for stage IV endometrial cancer and probable pulmonary metastasis with radical hysterectomy, BSO, pelvic lymph node dissection, vaginal biopsies with extensive pelvic disease including positive lymph nodes and positive distal vaginal biopsy. Also with tubular adenoma with focal high-grade dysplasia status post resection of adenocarcinoma of the colon was 16 benign lymph nodes.  However last visit her vomiting had resolved, still with abdominal pain. Main concern at that time were persistent hiccups and dysphagia with occasional regurgitation status post previous EGD and dilation. Also with dyspepsia symptoms. Denied NSAIDs and aspirin powders. Was on Dexilant daily at that time. I added Zantac 150 milligrams in the evenings as needed and referred her for upper endoscopy and three-month follow-up.  EGD was completed 05/19/2016 found normal esophagus status post [facility] dilation, small hiatal hernia, otherwise normal exam. Recommended continue current medications and follow-up in 3 months as previously recommended.  Today she states she'd doing well overall. CT in August of this year looked good, treatment appears to be working. No further dysphagia, esophageal dilation helped. GERD symptoms doing ok. Adding Zantac in the evenings has helped her. Denies abdominal pain, N/V, hematochezia, melena, fever, chills, unintentional weight loss. Does have diarrhea with red meat, other meats not offensive. Has had a tick bite before. Denies chest pain, dyspnea, dizziness, lightheadedness, syncope, near syncope.  Denies any other upper or lower GI symptoms.  Past Medical History:  Diagnosis Date  . Adenocarcinoma of colon ([place]) 09/13/2015  . Anemia   . Anxiety   . Asthma   . Cancer ([place])    endometrial; cancer cells in intestine  . COPD (chronic obstructive pulmonary disease) ([place])    no definite diagnosis  . Depression   . Diverticulitis   . Dyspareunia 05/16/2015  . Family history of cancer   . Family history of kidney cancer   . GERD (gastroesophageal reflux disease)   . Indigestion   . Lynch syndrome   . Neuropathy ([facility])    feet and hands  . Uterine fibroid   . Vaginal dryness 05/16/2015  . Vaginal itching 07/16/2015  . Vaginal Pap smear, abnormal     Past Surgical History:  Procedure Laterality Date  . ABDOMINAL HYSTERECTOMY    . APPENDECTOMY    . BIOPSY N/A 03/14/2015   Procedure: BIOPSY;  Surgeon: Daneil Dolin, MD;  Location: [facility];  Service: Endoscopy;  Laterality: N/A;  Gastric  . COLONOSCOPY WITH PROPOFOL N/A 03/14/2015   RMR: Internal hemorrhoids. colonic diverticulosis. Incomplete examination. Prepartation inadequate.  . COLONOSCOPY WITH PROPOFOL N/A 07/04/2015   RMR: Colonic diverticulosis . Large polypoid lesion in the vicinity of the hepatic flexure status post saline-assisted piecmeal snare polypectomy  with ablation and tattooing as described. Sigmoid polyp removed as described above. sigmoid colon polyp hyperplastic, hepatic flexure polyp with TA with focal high grade dysplasia   . ESOPHAGEAL DILATION N/A 03/14/2015   Procedure: ESOPHAGEAL DILATION;  Surgeon: Daneil Dolin, MD;  Location: [facility];  Service: Endoscopy;  Laterality: N/A;  Maloney 36  . ESOPHAGOGASTRODUODENOSCOPY N/A 05/19/2016   Procedure: ESOPHAGOGASTRODUODENOSCOPY (EGD);  Surgeon: Herbie Baltimore  Hilton Cork, MD;  Location: [facility];  Service: Endoscopy;  Laterality: N/A;  215  . ESOPHAGOGASTRODUODENOSCOPY (EGD) WITH PROPOFOL N/A 03/14/2015   RMR: Mild erosive reflux esophagitis status post passage o f a  Maloney dilator. Abnormal gastric mucosa of uncertain significance as described above. status post biopsy, benign  . MALONEY DILATION N/A 05/19/2016   Procedure: [name] DILATION;  Surgeon: Daneil Dolin, MD;  [facility]: [facility];  Service: Endoscopy;  Laterality: N/A;  . PARTIAL COLECTOMY  08/30/2015   polyp with adenocarcinoma  . POLYPECTOMY N/A 07/04/2015   Procedure: POLYPECTOMY;  Surgeon: Daneil Dolin, MD;  Location: [facility];  Service: Endoscopy;  Laterality: N/A;  . PORTACATH PLACEMENT Right 09/2014  . TUBAL LIGATION      Current Outpatient Prescriptions  Medication Sig Dispense Refill  . albuterol (PROVENTIL HFA;VENTOLIN HFA) 108 (90 Base) MCG/ACT inhaler Inhale 2 puffs into the lungs every 2 (two) hours as needed for wheezing or shortness of breath (cough). 1 Inhaler 3  . albuterol (PROVENTIL) (2.5 MG/3ML) 0.083% nebulizer solution Take 3 mLs (2.5 mg total) by nebulization every 6 (six) hours as needed for wheezing or shortness of breath. 75 mL 12  . ALPRAZolam (XANAX) 0.25 MG tablet Take 0.25 mg by mouth 3 (three) times daily as needed for anxiety.     . citalopram (CELEXA) 20 MG tablet Take 20 mg by mouth daily.    . citalopram (CELEXA) 40 MG tablet Take 40 mg by mouth at bedtime.  2  . dexlansoprazole (DEXILANT) 60 MG capsule Take 1 capsule (60 mg total) by mouth daily. 90 capsule 3  . gabapentin (NEURONTIN) 250 MG/5ML solution Take by mouth 3 (three) times daily.    . Linaclotide (LINZESS) 145 MCG CAPS capsule Take 1 capsule (145 mcg total) by mouth daily before breakfast. 30 capsule 5  . loratadine (CLARITIN) 10 MG tablet Take 10 mg by mouth daily.    . ondansetron (ZOFRAN) 8 MG tablet TAKE 1 TABLET BY MOUTH EVERY 8 HOURS AS NEEDED FOR NAUSEA. 30 tablet 3  . ranitidine (ZANTAC) 150 MG tablet Take 1 tablet (150 mg total) by mouth at bedtime as needed for heartburn. 30 tablet 2  . traMADol (ULTRAM) 50 MG tablet TAKE 1 TABLET BY MOUTH EVERY 6 HOURS AS NEEDED 30 tablet 1  .  traZODone (DESYREL) 50 MG tablet Take 50 mg by mouth at bedtime.     No current facility-administered medications for this visit.     Allergies as of 07/23/2016 - Review Complete 07/23/2016  Allergen Reaction Noted  . Codeine Rash 08/25/2011    Family History  Problem Relation Age of Onset  . Other Mother     clot that went to heart, deceased age 28ss  . Heart attack Father     age 28s, deceased  . Stroke Father   . Cancer Father     "at death determined he was ate up with cancer"  . Hypertension Sister   . Kidney cancer Sister 62  . Diabetes Brother   . Hypertension Brother   . Endometriosis Daughter   . Heart attack Maternal Grandfather   . Diabetes Sister   . Brain cancer Paternal Aunt   . Cancer Paternal Uncle     NOS  . Cancer Paternal Uncle     NOS  . Cancer Paternal Uncle     NOS  . Colon cancer Neg Hx     Social History   Social History  . Marital status:  Single    Spouse name: N/A  . Number of children: 2  . Years of education: N/A   Social History Main Topics  . Smoking status: Current Every Day Smoker    Packs/day: 0.25    Years: 25.00    Types: Cigarettes  . Smokeless tobacco: Never Used  . Alcohol use No  . Drug use: No  . Sexual activity: Not Currently    Birth control/ protection: Surgical     Comment: hyst   Other Topics Concern  . None   Social History Narrative  . None    Review of Systems: General: Negative for anorexia, weight loss, fever, chills, fatigue, weakness. ENT: Negative for hoarseness, difficulty swallowing. CV: Negative for chest pain, angina, palpitations, peripheral edema.  Respiratory: Negative for dyspnea at rest, cough, sputum, wheezing.  GI: See history of present illness. Endo: Negative for unusual weight change.  Heme: Negative for bruising or bleeding. Allergy: Negative for rash or hives.   Physical Exam: BP 131/88   Pulse 79   Temp 97.8 F (36.6 C) (Oral)   Ht 5\' 3"  (1.6 m)   Wt 156 lb 6.4 oz  (70.9 kg)   BMI 27.71 kg/m  General:   Alert and oriented. Pleasant and cooperative. Well-nourished and well-developed.  Ears:  Normal auditory acuity. Cardiovascular:  S1, S2 present without murmurs appreciated. Extremities without clubbing or edema. Respiratory:  Clear to auscultation bilaterally. No wheezes, rales, or rhonchi. No distress.  Gastrointestinal:  +BS, soft, non-tender and non-distended. No HSM noted. No guarding or rebound. No masses appreciated.  Rectal:  Deferred  Musculoskalatal:  Symmetrical without gross deformities. Skin:  Midline abdominal scar noted. Right upper chest subcutaneous port noted and non-tender. Neurologic:  Alert and oriented x4;  grossly normal neurologically. Psych:  Alert and cooperative. Normal mood and affect. Heme/Lymph/Immune: No excessive bruising noted.    07/23/2016 12:05 PM   Disclaimer: This note was dictated with voice recognition software. Similar sounding words can inadvertently be transcribed and may not be corrected upon review.

## 2016-07-23 NOTE — Patient Instructions (Signed)
1. Continue taking her current medications. 2. Return for follow-up as needed for any worsening or returning symptoms.

## 2016-07-23 NOTE — Assessment & Plan Note (Signed)
Dysphagia resolved after dilation. Continue to monitor, return for follow-up as needed. Call our office with any problems or questions.

## 2016-07-23 NOTE — Assessment & Plan Note (Signed)
Symptoms much improved with the addition of Zantac in the evenings. Continue to monitor, return for follow-up as needed. Call our office with any problems or questions.

## 2016-07-23 NOTE — Progress Notes (Signed)
cc'ed to pcp °

## 2016-07-27 ENCOUNTER — Encounter (HOSPITAL_COMMUNITY): Payer: Self-pay | Admitting: Hematology & Oncology

## 2016-07-27 ENCOUNTER — Encounter (HOSPITAL_COMMUNITY): Payer: Medicaid Other | Attending: Hematology & Oncology | Admitting: Hematology & Oncology

## 2016-07-27 ENCOUNTER — Encounter (HOSPITAL_COMMUNITY): Payer: Medicaid Other

## 2016-07-27 VITALS — BP 129/67 | HR 81 | Temp 98.3°F | Resp 16 | Wt 160.6 lb

## 2016-07-27 DIAGNOSIS — T451X5A Adverse effect of antineoplastic and immunosuppressive drugs, initial encounter: Secondary | ICD-10-CM

## 2016-07-27 DIAGNOSIS — C189 Malignant neoplasm of colon, unspecified: Secondary | ICD-10-CM

## 2016-07-27 DIAGNOSIS — R202 Paresthesia of skin: Secondary | ICD-10-CM

## 2016-07-27 DIAGNOSIS — Z720 Tobacco use: Secondary | ICD-10-CM

## 2016-07-27 DIAGNOSIS — G620 Drug-induced polyneuropathy: Secondary | ICD-10-CM

## 2016-07-27 DIAGNOSIS — C541 Malignant neoplasm of endometrium: Secondary | ICD-10-CM

## 2016-07-27 DIAGNOSIS — Z1509 Genetic susceptibility to other malignant neoplasm: Secondary | ICD-10-CM

## 2016-07-27 LAB — CBC WITH DIFFERENTIAL/PLATELET
Basophils Absolute: 0 10*3/uL (ref 0.0–0.1)
Basophils Relative: 0 %
EOS ABS: 0.1 10*3/uL (ref 0.0–0.7)
Eosinophils Relative: 2 %
HEMATOCRIT: 40.5 % (ref 36.0–46.0)
HEMOGLOBIN: 13.6 g/dL (ref 12.0–15.0)
LYMPHS ABS: 2.5 10*3/uL (ref 0.7–4.0)
LYMPHS PCT: 35 %
MCH: 30.5 pg (ref 26.0–34.0)
MCHC: 33.6 g/dL (ref 30.0–36.0)
MCV: 90.8 fL (ref 78.0–100.0)
MONOS PCT: 9 %
Monocytes Absolute: 0.6 10*3/uL (ref 0.1–1.0)
NEUTROS ABS: 3.8 10*3/uL (ref 1.7–7.7)
NEUTROS PCT: 54 %
Platelets: 226 10*3/uL (ref 150–400)
RBC: 4.46 MIL/uL (ref 3.87–5.11)
RDW: 13.6 % (ref 11.5–15.5)
WBC: 7.1 10*3/uL (ref 4.0–10.5)

## 2016-07-27 LAB — COMPREHENSIVE METABOLIC PANEL
ALK PHOS: 66 U/L (ref 38–126)
ALT: 19 U/L (ref 14–54)
ANION GAP: 6 (ref 5–15)
AST: 19 U/L (ref 15–41)
Albumin: 4.3 g/dL (ref 3.5–5.0)
BILIRUBIN TOTAL: 0.4 mg/dL (ref 0.3–1.2)
BUN: 12 mg/dL (ref 6–20)
CALCIUM: 9.3 mg/dL (ref 8.9–10.3)
CO2: 28 mmol/L (ref 22–32)
CREATININE: 0.9 mg/dL (ref 0.44–1.00)
Chloride: 103 mmol/L (ref 101–111)
Glucose, Bld: 95 mg/dL (ref 65–99)
Potassium: 4.3 mmol/L (ref 3.5–5.1)
Sodium: 137 mmol/L (ref 135–145)
TOTAL PROTEIN: 7.7 g/dL (ref 6.5–8.1)

## 2016-07-27 MED ORDER — TRAMADOL HCL 50 MG PO TABS: 50.0000 mg | ORAL_TABLET | Freq: Four times a day (QID) | ORAL | 1 refills | Status: DC | PRN

## 2016-07-27 MED ORDER — PREGABALIN 75 MG PO CAPS: ORAL_CAPSULE | ORAL | 1 refills | Status: DC

## 2016-07-27 NOTE — Patient Instructions (Signed)
[place] at [facility] Discharge Instructions  RECOMMENDATIONS MADE BY THE CONSULTANT AND ANY TEST RESULTS WILL BE SENT TO YOUR REFERRING PHYSICIAN.  You saw Dr.Penland today. Tramadol refilled.  Start Lyrica- take 1 capsule twice a day for 1 week then increase to 1 capsule in am and 2 capsules in evening x 1 week, then increase to 2 capsules twice daily thereafter. CT scans in February.  Follow up after CT scans with labs. See Amy at checkout for appointments.  Thank you for choosing [place] at [facility] to provide your oncology and hematology care.  To afford each patient quality time with our provider, please arrive at least 15 minutes before your scheduled appointment time.   Beginning January 23rd 2017 lab work for the [facility] will be done in the  Main lab at [facility] on 1st floor. If you have a lab appointment with the [facility] please come in thru the  Main Entrance and check in at the main information desk  You need to re-schedule your appointment should you arrive 10 or more minutes late.  We strive to give you quality time with our providers, and arriving late affects you and other patients whose appointments are after yours.  Also, if you no show three or more times for appointments you may be dismissed from the clinic at the providers discretion.     Again, thank you for choosing [facility].  Our hope is that these requests will decrease the amount of time that you wait before being seen by our physicians.       _____________________________________________________________  Should you have questions after your visit to [facility], please contact our office at [phone] between the hours of 8:30 a.m. and 4:30 p.m.  Voicemails left after 4:30 p.m. will not be returned until the following business day.  For prescription refill requests, have your pharmacy contact our office.          Resources For Cancer Patients and their Caregivers ? American Cancer Society: Can assist with transportation, wigs, general needs, runs Look Good Feel Better.        269-778-7887 ? Cancer Care: Provides financial assistance, online support groups, medication/co-pay assistance.  1-800-813-HOPE 4404004565) ? [place] Assists [place] cancer patients and their families through emotional , educational and financial support.  (517) 260-8971 ? [place] DSS Where to apply for food stamps, Medicaid and utility assistance. (669)205-2397 ? [facility]: Transportation to medical appointments. 778-200-7264 ? Social Security Administration: May apply for disability if have a Stage IV cancer. 660-051-6464 912-060-0199 ? [employer], Disability and Transit Services: Assists with nutrition, care and transit needs. [place] Support Programs: @10RELATIVEDAYS @ > Cancer Support Group  2nd Tuesday of the month 1pm-2pm, Journey Room  > Creative Journey  3rd Tuesday of the month 1130am-1pm, Journey Room  > Look Good Feel Better  1st Wednesday of the month 10am-12 noon, Journey Room (Call [place] to register [phone])

## 2016-07-27 NOTE — Progress Notes (Signed)
[place] PROGRESS NOTE  CHIEF COMPLAINTS:  Stage IV Endometrial Cancer, probable pulmonary metastases. Radical hysterectomy, BSO, pelvic LN dissection and vaginal biopsies 07/31/2014, patient had extensive pelvic disease including positive LN and positive distal vaginal biopsy  CA-125 137.41 on 07/19/2014 CA-125 25.72 on 09/27/2014  (normal range 0-20.9U/ml)  Tubular adenoma with focal high grade dysplasia on C-scope 07/04/2015 R Hemicolectomy with Dr. Arnoldo Morale on 08/30/2015 intramucosal adenocarcinoma of colon, 16 benign LN Genetics Referral 10/03/2015    Endometrial cancer ([place])   07/12/2014 Imaging    CT C/A/P, pelvic adenopathy, multiple pulmonary nodules concerning for metastatic disease      07/30/2014 Initial Diagnosis    Endometrial cancer      07/31/2014 Pathology Results    endometrioid adenocarcinoma, G2, lymphovascular invasion present, positive pelvic lymph node and vaginal biopsy      07/31/2014 Definitive Surgery    radical hysterectomy, BSO, pelvic lymph node dissection and vaginal biopsies      09/25/2014 Procedure    Port-A-Cath placement in IR      10/18/2014 - 01/10/2015 Chemotherapy    Carboplatin/Taxol. First cycle given at [facility].  S/P 6 cycles total, 5 cycles given at [facility].      07/04/2015 Procedure    Colonoscopy by Dr. Gala Romney      07/04/2015 Pathology Results    Colon, polyp(s), vicinity of hepatic flexure - TUBULAR ADENOMA WITH FOCAL HIGH GRADE DYSPLASIA.       Procedure    Partial colectomy by Dr. Arnoldo Morale scheduled for 08/30/2015      10/31/2015 Genetic Testing    [facility] SYNDROME MSH6 Mutation      04/27/2016 Imaging    CT CAP- No acute process or evidence of metastatic disease in the chest. Right middle lobe pulmonary nodule is unchanged back to 11/20/2014, favoring a benign etiology       Adenocarcinoma of colon ([place])   07/04/2015 Pathology Results    Diagnosis 1. Colon, polyp(s), vicinity of hepatic flexure - TUBULAR ADENOMA  WITH FOCAL HIGH GRADE DYSPLASIA. - NO INVASIVE CARCINOMA. 2. Colon, polyp(s), sigmoid - HYPERPLASTIC POLYP. - NO DYSPLASIA OR MALIGNANCY.      07/04/2015 Procedure    Colonoscopoy by Dr. Gala Romney- large polypoid colonic lesion.      08/30/2015 Definitive Surgery    Dr. Arnoldo Morale- right segmental colon resection      08/30/2015 Pathology Results    [id number] intramucosal adenocarcinoma of colon, 0.2 cm inding the laminal propria with negative resection margins and 0/17 lymph nodes.       HISTORY OF PRESENTING ILLNESS:   Julia Osborne 55 y.o. female is here because of stage IV endometrial cancer and an intramucosal adenocarcinoma of the colon. She has completed all therapy.  She has been diagnosed with Lynch Syndrome.   Julia Osborne returns to the [facility] today unaccompanied.  She is feeling good overall. She has been working on her house, painting both the inside and out. States her mood is fine. She has been feeling bored, but keeps herself busy. She stays in contact and visits her niece and grandkids.   She is requesting a refill of her tramadol. She is almost out of gabapentin but is not sure if she should refill it. She would like to try Lyrica instead. She complains of numbness and burning in her hands, especially while driving. "That's the only frustrating part about any of it".   She is doing pretty well on smoking by not smoking nearly as much as she  used to. A pack usually lasts her a couple days now.   Her appetite is good. She denies any abdominal pain, new cough, or breathing issue.  She is up to date on her colonoscopy and mammogram. She has received a flu shot this year.  She now sees the NP at [facility] for primary care.   MEDICAL HISTORY:  Past Medical History:  Diagnosis Date  . Adenocarcinoma of colon ([place]) 09/13/2015  . Anemia   . Anxiety   . Asthma   . Cancer ([place])    endometrial; cancer cells in intestine  . COPD (chronic obstructive pulmonary disease)  ([place])    no definite diagnosis  . Depression   . Diverticulitis   . Dyspareunia 05/16/2015  . Family history of cancer   . Family history of kidney cancer   . GERD (gastroesophageal reflux disease)   . Indigestion   . Lynch syndrome   . Neuropathy ([facility])    feet and hands  . Uterine fibroid   . Vaginal dryness 05/16/2015  . Vaginal itching 07/16/2015  . Vaginal Pap smear, abnormal     SURGICAL HISTORY: Past Surgical History:  Procedure Laterality Date  . ABDOMINAL HYSTERECTOMY    . APPENDECTOMY    . BIOPSY N/A 03/14/2015   Procedure: BIOPSY;  Surgeon: Daneil Dolin, MD;  Location: [facility] ORS;  Service: Endoscopy;  Laterality: N/A;  Gastric  . COLONOSCOPY WITH PROPOFOL N/A 03/14/2015   RMR: Internal hemorrhoids. colonic diverticulosis. Incomplete examination. Prepartation inadequate.  . COLONOSCOPY WITH PROPOFOL N/A 07/04/2015   RMR: Colonic diverticulosis . Large polypoid lesion in the vicinity of the hepatic flexure status post saline-assisted piecmeal snare polypectomy  with ablation and tattooing as described. Sigmoid polyp removed as described above. sigmoid colon polyp hyperplastic, hepatic flexure polyp with TA with focal high grade dysplasia   . ESOPHAGEAL DILATION N/A 03/14/2015   Procedure: ESOPHAGEAL DILATION;  Surgeon: Daneil Dolin, MD;  Location: [facility] ORS;  Service: Endoscopy;  Laterality: N/A;  Maloney 73  . ESOPHAGOGASTRODUODENOSCOPY N/A 05/19/2016   Procedure: ESOPHAGOGASTRODUODENOSCOPY (EGD);  Surgeon: Daneil Dolin, MD;  Location: [facility] ENDO SUITE;  Service: Endoscopy;  Laterality: N/A;  215  . ESOPHAGOGASTRODUODENOSCOPY (EGD) WITH PROPOFOL N/A 03/14/2015   RMR: Mild erosive reflux esophagitis status post passage o f a Maloney dilator. Abnormal gastric mucosa of uncertain significance as described above. status post biopsy, benign  . MALONEY DILATION N/A 05/19/2016   Procedure: [name] DILATION;  Surgeon: Daneil Dolin, MD;  Location: [facility] ENDO SUITE;  Service: Endoscopy;   Laterality: N/A;  . PARTIAL COLECTOMY  08/30/2015   polyp with adenocarcinoma  . POLYPECTOMY N/A 07/04/2015   Procedure: POLYPECTOMY;  Surgeon: Daneil Dolin, MD;  Location: [facility] ORS;  Service: Endoscopy;  Laterality: N/A;  . PORTACATH PLACEMENT Right 09/2014  . TUBAL LIGATION      SOCIAL HISTORY: Social History   Social History  . Marital status: Single    Spouse name: N/A  . Number of children: 2  . Years of education: N/A   Occupational History  . Not on file.   Social History Main Topics  . Smoking status: Current Every Day Smoker    Packs/day: 0.25    Years: 25.00    Types: Cigarettes  . Smokeless tobacco: Never Used  . Alcohol use No  . Drug use: No  . Sexual activity: Not Currently    Birth control/ protection: Surgical     Comment: hyst   Other Topics Concern  .  Not on file   Social History Narrative  . No narrative on file  She was working, but now has applied for SSI and disability. She is now living with her niece. 2 children. 3 grandchildren, one is deceased. Divorced. Smoker 1/2 ppd. Trying to cut back.  Rare alcohol use.   FAMILY HISTORY: Family History  Problem Relation Age of Onset  . Other Mother     clot that went to heart, deceased age 78ss  . Heart attack Father     age 60s, deceased  . Stroke Father   . Cancer Father     "at death determined he was ate up with cancer"  . Hypertension Sister   . Kidney cancer Sister 58  . Diabetes Brother   . Hypertension Brother   . Endometriosis Daughter   . Heart attack Maternal Grandfather   . Diabetes Sister   . Brain cancer Paternal Aunt   . Cancer Paternal Uncle     NOS  . Cancer Paternal Uncle     NOS  . Cancer Paternal Uncle     NOS  . Colon cancer Neg Hx    indicated that her mother is deceased. She indicated that her father is deceased. She indicated that both of her sisters are alive. She indicated that both of her brothers are alive. She indicated that her maternal grandmother is  deceased. She indicated that her maternal grandfather is deceased. She indicated that her paternal grandmother is deceased. She indicated that her paternal grandfather is deceased. She indicated that her daughter is alive. She indicated that her son is alive. She indicated that her maternal aunt is alive. She indicated that her maternal uncle is alive. She indicated that her paternal aunt is deceased. She indicated that all of her three paternal uncles are deceased. She indicated that the status of her neg hx is unknown.     Mother deceased at 43 from embolus Father deceased at 43 from MI 2 sisters and 2 brothers, Oldest sister had nephrectomy for RCC.   ALLERGIES:  is allergic to codeine.  MEDICATIONS:  Current Outpatient Prescriptions  Medication Sig Dispense Refill  . albuterol (PROVENTIL HFA;VENTOLIN HFA) 108 (90 Base) MCG/ACT inhaler Inhale 2 puffs into the lungs every 2 (two) hours as needed for wheezing or shortness of breath (cough). 1 Inhaler 3  . albuterol (PROVENTIL) (2.5 MG/3ML) 0.083% nebulizer solution Take 3 mLs (2.5 mg total) by nebulization every 6 (six) hours as needed for wheezing or shortness of breath. 75 mL 12  . ALPRAZolam (XANAX) 0.25 MG tablet Take 0.25 mg by mouth 3 (three) times daily as needed for anxiety.     . citalopram (CELEXA) 20 MG tablet Take 20 mg by mouth daily.    . citalopram (CELEXA) 40 MG tablet Take 40 mg by mouth at bedtime.  2  . dexlansoprazole (DEXILANT) 60 MG capsule Take 1 capsule (60 mg total) by mouth daily. 90 capsule 3  . gabapentin (NEURONTIN) 250 MG/5ML solution Take by mouth 3 (three) times daily.    . Linaclotide (LINZESS) 145 MCG CAPS capsule Take 1 capsule (145 mcg total) by mouth daily before breakfast. 30 capsule 5  . loratadine (CLARITIN) 10 MG tablet Take 10 mg by mouth daily.    . ondansetron (ZOFRAN) 8 MG tablet TAKE 1 TABLET BY MOUTH EVERY 8 HOURS AS NEEDED FOR NAUSEA. 30 tablet 3  . ranitidine (ZANTAC) 150 MG tablet Take 1 tablet  (150 mg total) by mouth at bedtime as  needed for heartburn. 30 tablet 2  . traMADol (ULTRAM) 50 MG tablet TAKE 1 TABLET BY MOUTH EVERY 6 HOURS AS NEEDED 30 tablet 1  . traZODone (DESYREL) 50 MG tablet Take 50 mg by mouth at bedtime.     No current facility-administered medications for this visit.     Review of Systems  Review of Systems  Constitutional: Negative.   HENT: Negative.   Eyes: Negative.   Respiratory: Negative.  Negative for cough and shortness of breath.   Cardiovascular: Negative.   Gastrointestinal: Negative.  Negative for abdominal pain.  Genitourinary: Negative.   Musculoskeletal: Negative.   Skin: Negative.   Neurological: Positive for tingling.       Numbness and burning in her hands  Endo/Heme/Allergies: Negative.   Psychiatric/Behavioral: Negative.   All other systems reviewed and are negative. 14 point review of systems was performed and is negative except as detailed under history of present illness and above   PHYSICAL EXAMINATION:  ECOG PERFORMANCE STATUS: 1 - Symptomatic but completely ambulatory  Vitals:   07/27/16 0948  BP: 129/67  Pulse: 81  Resp: 16  Temp: 98.3 F (36.8 C)   Filed Weights   07/27/16 0948  Weight: 160 lb 9.6 oz (72.8 kg)   Physical Exam  Constitutional: She is oriented to person, place, and time and well-developed, well-nourished, and in no distress.  HENT:  Head: Normocephalic and atraumatic.  Nose: Nose normal.  Mouth/Throat: Oropharynx is clear and moist. No oropharyngeal exudate.  Eyes: Conjunctivae and EOM are normal. Pupils are equal, round, and reactive to light. Right eye exhibits no discharge. Left eye exhibits no discharge. No scleral icterus.  Neck: Normal range of motion. Neck supple. No tracheal deviation present. No thyromegaly present.  Cardiovascular: Normal rate, regular rhythm and normal heart sounds.  Exam reveals no gallop and no friction rub.   No murmur heard. Pulmonary/Chest: Effort normal and  breath sounds normal. She has no wheezes. She has no rales.  Abdominal: Soft. Bowel sounds are normal. She exhibits no distension and no mass. There is no tenderness. There is no rebound and no guarding.  Musculoskeletal: Normal range of motion. She exhibits no edema.  Lymphadenopathy:    She has no cervical adenopathy.  Neurological: She is alert and oriented to person, place, and time. She has normal reflexes. No cranial nerve deficit. Gait normal. Coordination normal.  Skin: Skin is warm and dry. No rash noted.  Psychiatric: Mood, memory, affect and judgment normal.  Nursing note and vitals reviewed.   LABORATORY DATA:  I have reviewed the data as listed Lab Results  Component Value Date   WBC 7.4 04/09/2016   HGB 12.2 04/09/2016   HCT 36.1 04/09/2016   MCV 89.6 04/09/2016   PLT 209 04/09/2016     Chemistry      Component Value Date/Time   NA 137 04/09/2016 1107   K 3.7 04/09/2016 1107   CL 105 04/09/2016 1107   CO2 25 04/09/2016 1107   BUN 11 04/09/2016 1107   CREATININE 0.81 04/09/2016 1107      Component Value Date/Time   CALCIUM 8.5 (L) 04/09/2016 1107   ALKPHOS 64 04/09/2016 1107   AST 16 04/09/2016 1107   ALT 16 04/09/2016 1107   BILITOT 0.4 04/09/2016 1107     Results for Julia Osborne, Julia Osborne (MRN [id number])   Ref. Range 02/08/2015 12:41 06/28/2015 11:39 10/18/2015 11:45 01/16/2016 09:47 04/09/2016 11:07  CA 125 Latest Ref Range: 0.0 - 38.1 U/mL 14.6 15.8  17.3 19.3 13.1    RADIOLOGY: I have reviewed the images listed below and agree with the results Study Result   CLINICAL DATA:  Staging of endometrial cancer and colon cancer. Hysterectomy. Partial colon resection. Prior chemotherapy.  EXAM: CT CHEST, ABDOMEN, AND PELVIS WITH CONTRAST  TECHNIQUE: Multidetector CT imaging of the chest, abdomen and pelvis was performed following the standard protocol during bolus administration of intravenous contrast.  CONTRAST:  128m ISOVUE-300 IOPAMIDOL (ISOVUE-300)  INJECTION 61%  COMPARISON:  Chest radiograph 12/21/2015. Abdominal pelvic CT of 10/18/2015. Chest CT 07/22/2015.  FINDINGS: CT CHEST FINDINGS  Cardiovascular: A right Port-A-Cath which terminates at the superior caval/ atrial junction. Normal heart size, without pericardial effusion. No central pulmonary embolism, on this non-dedicated study.  Mediastinum/Nodes: No supraclavicular adenopathy. No mediastinal or hilar adenopathy.  Lungs/Pleura: No pleural fluid. Anterior right middle lobe lung nodule is unchanged at 6 mm on image 93/series 4.  Musculoskeletal: No acute osseous abnormality.  CT ABDOMEN PELVIS FINDINGS  Hepatobiliary: Normal liver. Normal gallbladder, without biliary ductal dilatation.  Pancreas: Normal, without mass or ductal dilatation.  Spleen: Normal in size, without focal abnormality.  Adrenals/Urinary Tract: Normal adrenal glands. Normal kidneys, without hydronephrosis. Normal urinary bladder. [place]  Stomach/Bowel: Normal stomach, without wall thickening. Status post partial right hemicolectomy. Re- demonstration of interstitial thickening within the base of the ileocolic mesentery. This is slightly more distinct and well-defined, measuring 3.2 x 1.8 cm on image 72/ series 2. No well-defined solid component identified.  Small bowel otherwise unremarkable.  Vascular/Lymphatic: Advanced aortic and branch vessel atherosclerosis. Similar small retroperitoneal nodes. None are pathologic by size criteria. No pelvic sidewall adenopathy.  Reproductive: Hysterectomy.  No adnexal mass.  Other: No significant free fluid. No well-defined omental/peritoneal disease. Mild abdominal wall laxity.  Musculoskeletal: Left iliac bone island. Degenerative disc disease at the lumbosacral junction.  IMPRESSION: 1.  No acute process or evidence of metastatic disease in the chest. 2. Status post right partial hemicolectomy and hysterectomy.  Edema within the base of the ileocolic mesentery is favored to be postoperative. Although this is felt unlikely to represent peritoneal metastasis, given slight increase in definition today, followup attention is recommended. 3. Right middle lobe pulmonary nodule is unchanged back to 11/20/2014, favoring a benign etiology   Electronically Signed   By: KAbigail MiyamotoM.D.   On: 04/27/2016 15:35    PATHOLOGY:    ASSESSMENT & PLAN:  Stage IV Endometrial Carcinoma [facility] Chemotherapy induced neuropathy Tubular adenoma with focal high grade dysplasia on C-scope 07/04/2015 R Hemicolectomy with Dr. JArnoldo Moraleon 08/30/2015 intramucosal adenocarcinoma of colon, 16 benign LN Bronchitis/Seasonal allergies Lynch Syndrome  She remains without evidence of recurrence. CA-125 has been WNL, it was elevated at diagnosis. It is pending today she will be apprised of results when available. [facility] has moved [facility]  Patient was again encouraged to discontinue smoking. We have discussed options for smoking cessation.   She is due for repeat imaging. CT scans have been ordered. Last CT scans were in August 2017. She will be due for repeat CT scans in February 2018.   I have refilled her tramadol today. I have written a prescription for Lyrica for neuropathy.  She is up to date in regards to her screening for her Lynch syndrome.      RTC post scans with labs and follow-up.   Orders Placed This Encounter  Procedures  . CT Abdomen Pelvis W Contrast    Standing  Status:   Future    Standing Expiration Date:   07/27/2017    Order Specific Question:   If indicated for the ordered procedure, I authorize the administration of contrast media per Radiology protocol    Answer:   Yes    Order Specific Question:   Reason for Exam (SYMPTOM  OR DIAGNOSIS REQUIRED)    Answer:   restaging stage IV endometrial, lynch syndrome    Order Specific Question:   Is  patient pregnant?    Answer:   No    Order Specific Question:   Preferred imaging location?    Answer:   [facility]  . CT Chest W Contrast    Standing Status:   Future    Standing Expiration Date:   07/27/2017    Order Specific Question:   If indicated for the ordered procedure, I authorize the administration of contrast media per Radiology protocol    Answer:   Yes    Order Specific Question:   Reason for Exam (SYMPTOM  OR DIAGNOSIS REQUIRED)    Answer:   restaging stage IV endometrial, lynch syndrome    Order Specific Question:   Is patient pregnant?    Answer:   No    Order Specific Question:   Preferred imaging location?    Answer:   [facility]  . CBC with Differential    Standing Status:   Future    Standing Expiration Date:   07/27/2017  . Comprehensive metabolic panel    Standing Status:   Future    Standing Expiration Date:   07/27/2017  . CA 125    Standing Status:   Future    Standing Expiration Date:   07/27/2017  . CBC with Differential    Standing Status:   Future    Standing Expiration Date:   07/27/2017  . Comprehensive metabolic panel    Standing Status:   Future    Standing Expiration Date:   07/27/2017  . CA 125    Standing Status:   Future    Standing Expiration Date:   07/27/2017    All questions were answered. The patient knows to call the clinic with any problems, questions or concerns.  This document serves as a record of services personally performed by Ancil Linsey, MD. It was created on her behalf by Arlyce Harman, a trained medical scribe. The creation of this record is based on the scribe's personal observations and the provider's statements to them. This document has been checked and approved by the attending provider.  I have reviewed the above documentation for accuracy and completeness, and I agree with the above.  This note was electronically signed Kelby Fam. Penland MD

## 2016-07-28 LAB — CA 125: CA 125: 16.2 U/mL (ref 0.0–38.1)

## 2016-08-03 ENCOUNTER — Encounter: Payer: Self-pay | Admitting: Internal Medicine

## 2016-09-25 ENCOUNTER — Encounter (HOSPITAL_COMMUNITY): Payer: Self-pay

## 2016-09-25 ENCOUNTER — Encounter (HOSPITAL_COMMUNITY): Payer: Medicaid Other | Attending: Hematology & Oncology

## 2016-09-25 DIAGNOSIS — C189 Malignant neoplasm of colon, unspecified: Secondary | ICD-10-CM

## 2016-09-25 DIAGNOSIS — Z452 Encounter for adjustment and management of vascular access device: Secondary | ICD-10-CM

## 2016-09-25 DIAGNOSIS — C541 Malignant neoplasm of endometrium: Secondary | ICD-10-CM | POA: Insufficient documentation

## 2016-09-25 MED ORDER — HEPARIN SOD (PORK) LOCK FLUSH 100 UNIT/ML IV SOLN
500.0000 [IU] | Freq: Once | INTRAVENOUS | Status: AC
  Administered 2016-09-25: 500 [IU] via INTRAVENOUS
  Filled 2016-09-25: qty 5

## 2016-09-25 MED ORDER — SODIUM CHLORIDE 0.9% FLUSH
20.0000 mL | INTRAVENOUS | Status: DC | PRN
  Administered 2016-09-25: 20 mL via INTRAVENOUS
  Filled 2016-09-25: qty 20

## 2016-09-25 NOTE — Progress Notes (Signed)
Julia Osborne presented for Portacath access and flush. Double portacath located right chest wall accessed with  H 20 needle each side.  Good blood return present. Portacath flushed with 38ml NS and 500U/60ml Heparin each side and needles removed intact.  Procedure tolerated well and without incident.

## 2016-09-25 NOTE — Patient Instructions (Signed)
[place] at [facility] Discharge Instructions  RECOMMENDATIONS MADE BY THE CONSULTANT AND ANY TEST RESULTS WILL BE SENT TO YOUR REFERRING PHYSICIAN.  Port flush today. Return as scheduled for port flushes and lab work. Return as scheduled for office visit.   Thank you for choosing [place] at [facility] to provide your oncology and hematology care.  To afford each patient quality time with our provider, please arrive at least 15 minutes before your scheduled appointment time.    If you have a lab appointment with the [place] please come in thru the  Main Entrance and check in at the main information desk  You need to re-schedule your appointment should you arrive 10 or more minutes late.  We strive to give you quality time with our providers, and arriving late affects you and other patients whose appointments are after yours.  Also, if you no show three or more times for appointments you may be dismissed from the clinic at the providers discretion.     Again, thank you for choosing [facility].  Our hope is that these requests will decrease the amount of time that you wait before being seen by our physicians.       _____________________________________________________________  Should you have questions after your visit to [facility], please contact our office at [phone] between the hours of 8:30 a.m. and 4:30 p.m.  Voicemails left after 4:30 p.m. will not be returned until the following business day.  For prescription refill requests, have your pharmacy contact our office.       Resources For Cancer Patients and their Caregivers ? American Cancer Society: Can assist with transportation, wigs, general needs, runs Look Good Feel Better.        585-163-4471 ? Cancer Care: Provides financial assistance, online support groups, medication/co-pay assistance.  1-800-813-HOPE 418-002-2970) ? [place] Assists [place] cancer patients and their families through emotional , educational and financial support.  409-714-8536 ? [place] DSS Where to apply for food stamps, Medicaid and utility assistance. 559-781-9511 ? [facility]: Transportation to medical appointments. 972-140-8976 ? Social Security Administration: May apply for disability if have a Stage IV cancer. 909 079 7822 (617)252-5514 ? [employer], Disability and Transit Services: Assists with nutrition, care and transit needs. [place] Support Programs: @10RELATIVEDAYS @ > Cancer Support Group  2nd Tuesday of the month 1pm-2pm, Journey Room  > Creative Journey  3rd Tuesday of the month 1130am-1pm, Journey Room  > Look Good Feel Better  1st Wednesday of the month 10am-12 noon, Journey Room (Call [place] to register [phone])

## 2016-10-12 ENCOUNTER — Ambulatory Visit: Payer: Medicaid Other | Admitting: Obstetrics & Gynecology

## 2016-10-19 ENCOUNTER — Ambulatory Visit: Payer: Medicaid Other | Admitting: Obstetrics & Gynecology

## 2016-10-23 ENCOUNTER — Encounter (HOSPITAL_COMMUNITY): Payer: Medicaid Other | Attending: Oncology

## 2016-10-23 DIAGNOSIS — Z8542 Personal history of malignant neoplasm of other parts of uterus: Secondary | ICD-10-CM | POA: Insufficient documentation

## 2016-10-23 DIAGNOSIS — C189 Malignant neoplasm of colon, unspecified: Secondary | ICD-10-CM | POA: Diagnosis not present

## 2016-10-23 DIAGNOSIS — R296 Repeated falls: Secondary | ICD-10-CM | POA: Insufficient documentation

## 2016-10-23 DIAGNOSIS — G629 Polyneuropathy, unspecified: Secondary | ICD-10-CM | POA: Insufficient documentation

## 2016-10-23 DIAGNOSIS — F419 Anxiety disorder, unspecified: Secondary | ICD-10-CM | POA: Insufficient documentation

## 2016-10-23 DIAGNOSIS — C541 Malignant neoplasm of endometrium: Secondary | ICD-10-CM | POA: Diagnosis not present

## 2016-10-23 DIAGNOSIS — F17210 Nicotine dependence, cigarettes, uncomplicated: Secondary | ICD-10-CM | POA: Diagnosis not present

## 2016-10-23 DIAGNOSIS — Z79899 Other long term (current) drug therapy: Secondary | ICD-10-CM | POA: Insufficient documentation

## 2016-10-23 DIAGNOSIS — R42 Dizziness and giddiness: Secondary | ICD-10-CM | POA: Diagnosis not present

## 2016-10-23 LAB — COMPREHENSIVE METABOLIC PANEL
ALBUMIN: 4.6 g/dL (ref 3.5–5.0)
ALK PHOS: 62 U/L (ref 38–126)
ALT: 18 U/L (ref 14–54)
AST: 17 U/L (ref 15–41)
Anion gap: 9 (ref 5–15)
BILIRUBIN TOTAL: 0.6 mg/dL (ref 0.3–1.2)
BUN: 11 mg/dL (ref 6–20)
CALCIUM: 9.4 mg/dL (ref 8.9–10.3)
CO2: 28 mmol/L (ref 22–32)
Chloride: 101 mmol/L (ref 101–111)
Creatinine, Ser: 0.96 mg/dL (ref 0.44–1.00)
GFR calc Af Amer: >60 mL/min (ref >60)
GFR calc non Af Amer: >60 mL/min (ref >60)
GLUCOSE: 111 mg/dL — AB (ref 65–99)
Potassium: 3.6 mmol/L (ref 3.5–5.1)
Sodium: 138 mmol/L (ref 135–145)
TOTAL PROTEIN: 7.7 g/dL (ref 6.5–8.1)

## 2016-10-23 LAB — CBC WITH DIFFERENTIAL/PLATELET
BASOS PCT: 0 %
Basophils Absolute: 0 10*3/uL (ref 0.0–0.1)
EOS ABS: 0.1 10*3/uL (ref 0.0–0.7)
EOS PCT: 1 %
HCT: 40 % (ref 36.0–46.0)
Hemoglobin: 13.8 g/dL (ref 12.0–15.0)
LYMPHS ABS: 2.9 10*3/uL (ref 0.7–4.0)
Lymphocytes Relative: 38 %
MCH: 31.2 pg (ref 26.0–34.0)
MCHC: 34.5 g/dL (ref 30.0–36.0)
MCV: 90.5 fL (ref 78.0–100.0)
MONO ABS: 0.6 10*3/uL (ref 0.1–1.0)
MONOS PCT: 8 %
Neutro Abs: 3.9 10*3/uL (ref 1.7–7.7)
Neutrophils Relative %: 53 %
Platelets: 236 10*3/uL (ref 150–400)
RBC: 4.42 MIL/uL (ref 3.87–5.11)
RDW: 13.3 % (ref 11.5–15.5)
WBC: 7.5 10*3/uL (ref 4.0–10.5)

## 2016-10-25 LAB — CA 125: CA 125: 14.9 U/mL (ref 0.0–38.1)

## 2016-10-27 ENCOUNTER — Ambulatory Visit (INDEPENDENT_AMBULATORY_CARE_PROVIDER_SITE_OTHER): Payer: Medicaid Other | Admitting: Obstetrics & Gynecology

## 2016-10-27 ENCOUNTER — Encounter: Payer: Self-pay | Admitting: Obstetrics & Gynecology

## 2016-10-27 ENCOUNTER — Ambulatory Visit (HOSPITAL_COMMUNITY): Payer: Medicaid Other

## 2016-10-27 VITALS — BP 120/80 | HR 72 | Wt 163.0 lb

## 2016-10-27 DIAGNOSIS — S2001XA Contusion of right breast, initial encounter: Secondary | ICD-10-CM | POA: Diagnosis not present

## 2016-10-27 NOTE — Progress Notes (Signed)
Chief Complaint  Patient presents with  . Breast Mass    Right    Blood pressure 120/80, pulse 72, weight 163 lb (73.9 kg).  56 y.o. G2P2 No LMP recorded. Patient has had a hysterectomy. The current method of family planning is status post hysterectomy.  Outpatient Encounter Prescriptions as of 10/27/2016  Medication Sig Note  . albuterol (PROVENTIL HFA;VENTOLIN HFA) 108 (90 Base) MCG/ACT inhaler Inhale 2 puffs into the lungs every 2 (two) hours as needed for wheezing or shortness of breath (cough).   . citalopram (CELEXA) 20 MG tablet Take 20 mg by mouth daily.   Marland Kitchen dexlansoprazole (DEXILANT) 60 MG capsule Take 1 capsule (60 mg total) by mouth daily.   . Linaclotide (LINZESS) 145 MCG CAPS capsule Take 1 capsule (145 mcg total) by mouth daily before breakfast.   . pregabalin (LYRICA) 75 MG capsule Take 1 capsule PO BID x 1 week then take 1 capsule in am and 2 at HS x 1 week then take 2 capsules BID thereafter   . ranitidine (ZANTAC) 150 MG tablet Take 1 tablet (150 mg total) by mouth at bedtime as needed for heartburn.   . traMADol (ULTRAM) 50 MG tablet Take 1 tablet (50 mg total) by mouth every 6 (six) hours as needed.   . traZODone (DESYREL) 50 MG tablet Take 50 mg by mouth at bedtime.   Marland Kitchen albuterol (PROVENTIL) (2.5 MG/3ML) 0.083% nebulizer solution Take 3 mLs (2.5 mg total) by nebulization every 6 (six) hours as needed for wheezing or shortness of breath. (Patient not taking: Reported on 10/27/2016)   . ALPRAZolam (XANAX) 0.25 MG tablet Take 0.25 mg by mouth 3 (three) times daily as needed for anxiety.    . ondansetron (ZOFRAN) 8 MG tablet TAKE 1 TABLET BY MOUTH EVERY 8 HOURS AS NEEDED FOR NAUSEA.   . [DISCONTINUED] citalopram (CELEXA) 40 MG tablet Take 40 mg by mouth at bedtime. 06/12/2016: Received from: External Pharmacy Received Sig: TAKE 1 TABLET BY MOUTH AT BEDTIME  . [DISCONTINUED] gabapentin (NEURONTIN) 250 MG/5ML solution Take by mouth 3 (three) times daily.   .  [DISCONTINUED] loratadine (CLARITIN) 10 MG tablet Take 10 mg by mouth daily.    No facility-administered encounter medications on file as of 10/27/2016.     Subjective Pt fell about 4 weeks ago, having post chemo neuropathy and hit her right breast on the corner of a table She says it hurt terribly and the whole breast turned black and blue She says it has been very tender ever since She noticed a residual mass just medial to the nipple  Objective On exam some residual discoloration of the right breast and a small 2 x 2 ender area at 4 o'clock right at the areolar margin No other masses and it is tender  Pertinent ROS No burning with urination, frequency or urgency No nausea, vomiting or diarrhea Nor fever chills or other constitutional symptoms   Labs or studies     Impression Diagnoses this Encounter::   ICD-9-CM ICD-10-CM   1. Posttraumatic hematoma of breast, right, initial encounter 922.0 S20.01XA     Established relevant diagnosis(es):   Plan/Recommendations: No orders of the defined types were placed in this encounter.   Labs or Scans Ordered: No orders of the defined types were placed in this encounter.   Management:: Probable right breast hematoma, resolving, Re evlauate in 4 weeks  Follow up Return in about 1 month (around 11/24/2016) for Follow up, with Dr Elonda Husky.  Face to face time:  15 minutes  Greater than 50% of the visit time was spent in counseling and coordination of care with the patient.  The summary and outline of the counseling and care coordination is summarized in the note above.   All questions were answered.  Past Medical History:  Diagnosis Date  . Adenocarcinoma of colon ([place]) 09/13/2015  . Anemia   . Anxiety   . Asthma   . Cancer ([place])    endometrial; cancer cells in intestine  . COPD (chronic obstructive pulmonary disease) ([place])    no definite diagnosis  . Depression   . Diverticulitis   . Dyspareunia 05/16/2015  .  Family history of cancer   . Family history of kidney cancer   . GERD (gastroesophageal reflux disease)   . Indigestion   . Lynch syndrome   . Neuropathy ([facility])    feet and hands  . Uterine fibroid   . Vaginal dryness 05/16/2015  . Vaginal itching 07/16/2015  . Vaginal Pap smear, abnormal     Past Surgical History:  Procedure Laterality Date  . ABDOMINAL HYSTERECTOMY    . APPENDECTOMY    . BIOPSY N/A 03/14/2015   Procedure: BIOPSY;  Surgeon: Daneil Dolin, MD;  Location: [facility];  Service: Endoscopy;  Laterality: N/A;  Gastric  . COLONOSCOPY WITH PROPOFOL N/A 03/14/2015   RMR: Internal hemorrhoids. colonic diverticulosis. Incomplete examination. Prepartation inadequate.  . COLONOSCOPY WITH PROPOFOL N/A 07/04/2015   RMR: Colonic diverticulosis . Large polypoid lesion in the vicinity of the hepatic flexure status post saline-assisted piecmeal snare polypectomy  with ablation and tattooing as described. Sigmoid polyp removed as described above. sigmoid colon polyp hyperplastic, hepatic flexure polyp with TA with focal high grade dysplasia   . ESOPHAGEAL DILATION N/A 03/14/2015   Procedure: ESOPHAGEAL DILATION;  Surgeon: Daneil Dolin, MD;  Location: [facility];  Service: Endoscopy;  Laterality: N/A;  Maloney 46  . ESOPHAGOGASTRODUODENOSCOPY N/A 05/19/2016   Procedure: ESOPHAGOGASTRODUODENOSCOPY (EGD);  Surgeon: Daneil Dolin, MD;  Location: [facility];  Service: Endoscopy;  Laterality: N/A;  215  . ESOPHAGOGASTRODUODENOSCOPY (EGD) WITH PROPOFOL N/A 03/14/2015   RMR: Mild erosive reflux esophagitis status post passage o f a Mal[place] dilator. Abnormal gastric mucosa of uncertain significance as described above. status post biopsy, benign  . MALONEY DILATION N/A 05/19/2016   Procedure: [name] DILATION;  Surgeon: Daneil Dolin, MD;  Location: [facility];  Service: Endoscopy;  Laterality: N/A;  . PARTIAL COLECTOMY  08/30/2015   polyp with adenocarcinoma  . POLYPECTOMY N/A 07/04/2015    Procedure: POLYPECTOMY;  Surgeon: Daneil Dolin, MD;  Location: [facility];  Service: Endoscopy;  Laterality: N/A;  . PORTACATH PLACEMENT Right 09/2014  . TUBAL LIGATION      OB History    Gravida Para Term Preterm AB Living   2 2       2    SAB TAB Ectopic Multiple Live Births                  Allergies  Allergen Reactions  . Codeine Rash    Social History   Social History  . Marital status: Single    Spouse name: N/A  . Number of children: 2  . Years of education: N/A   Social History Main Topics  . Smoking status: Current Every Day Smoker    Packs/day: 0.25    Years: 25.00    Types: Cigarettes  . Smokeless tobacco: Never Used  . Alcohol  use No  . Drug use: No  . Sexual activity: Not Currently    Birth control/ protection: Surgical     Comment: hyst   Other Topics Concern  . None   Social History Narrative  . None    Family History  Problem Relation Age of Onset  . Other Mother     clot that went to heart, deceased age 36ss  . Heart attack Father     age 81s, deceased  . Stroke Father   . Cancer Father     "at death determined he was ate up with cancer"  . Hypertension Sister   . Kidney cancer Sister 46  . Diabetes Brother   . Hypertension Brother   . Endometriosis Daughter   . Heart attack Maternal Grandfather   . Diabetes Sister   . Brain cancer Paternal Aunt   . Cancer Paternal Uncle     NOS  . Cancer Paternal Uncle     NOS  . Cancer Paternal Uncle     NOS  . Colon cancer Neg Hx

## 2016-10-29 ENCOUNTER — Ambulatory Visit (HOSPITAL_COMMUNITY): Payer: Self-pay | Admitting: Hematology & Oncology

## 2016-10-29 ENCOUNTER — Ambulatory Visit (HOSPITAL_COMMUNITY): Payer: Self-pay | Admitting: Oncology

## 2016-10-29 ENCOUNTER — Telehealth (HOSPITAL_COMMUNITY): Payer: Self-pay | Admitting: Oncology

## 2016-10-29 NOTE — Telephone Encounter (Signed)
Peer to peer for CT CAP completed.  Approved: [id number].  KEFALAS,THOMAS, PA-C 10/29/2016 1:50 PM

## 2016-11-10 ENCOUNTER — Ambulatory Visit
Admission: RE | Admit: 2016-11-10 | Discharge: 2016-11-10 | Disposition: A | Discharge status: Home / Self Care | Payer: Medicaid Other | Source: Ambulatory Visit | Attending: Hematology & Oncology | Admitting: Hematology & Oncology

## 2016-11-10 DIAGNOSIS — C189 Malignant neoplasm of colon, unspecified: Secondary | ICD-10-CM | POA: Insufficient documentation

## 2016-11-10 DIAGNOSIS — I700 Atherosclerosis of aorta: Secondary | ICD-10-CM | POA: Diagnosis not present

## 2016-11-10 DIAGNOSIS — C541 Malignant neoplasm of endometrium: Secondary | ICD-10-CM | POA: Diagnosis not present

## 2016-11-10 MED ORDER — IOPAMIDOL (ISOVUE-300) INJECTION 61%
100.0000 mL | Freq: Once | INTRAVENOUS | Status: AC | PRN
  Administered 2016-11-10: 100 mL via INTRAVENOUS

## 2016-11-16 ENCOUNTER — Encounter (HOSPITAL_COMMUNITY): Payer: Medicaid Other | Attending: Adult Health | Admitting: Adult Health

## 2016-11-16 ENCOUNTER — Encounter (HOSPITAL_COMMUNITY): Payer: Self-pay | Admitting: Lab

## 2016-11-16 ENCOUNTER — Encounter (HOSPITAL_COMMUNITY): Payer: Self-pay | Admitting: Adult Health

## 2016-11-16 DIAGNOSIS — R42 Dizziness and giddiness: Secondary | ICD-10-CM

## 2016-11-16 DIAGNOSIS — G620 Drug-induced polyneuropathy: Secondary | ICD-10-CM | POA: Diagnosis not present

## 2016-11-16 DIAGNOSIS — M2550 Pain in unspecified joint: Secondary | ICD-10-CM | POA: Diagnosis not present

## 2016-11-16 DIAGNOSIS — C189 Malignant neoplasm of colon, unspecified: Secondary | ICD-10-CM

## 2016-11-16 DIAGNOSIS — C541 Malignant neoplasm of endometrium: Secondary | ICD-10-CM

## 2016-11-16 DIAGNOSIS — Z720 Tobacco use: Secondary | ICD-10-CM

## 2016-11-16 DIAGNOSIS — Z9181 History of falling: Secondary | ICD-10-CM

## 2016-11-16 DIAGNOSIS — F419 Anxiety disorder, unspecified: Secondary | ICD-10-CM

## 2016-11-16 MED ORDER — TRAMADOL HCL 50 MG PO TABS: 50.0000 mg | ORAL_TABLET | Freq: Four times a day (QID) | ORAL | 0 refills | Status: DC | PRN

## 2016-11-16 NOTE — Progress Notes (Signed)
[place], [place] [address]   CLINIC:  Medical Oncology/Hematology  PCP:  Wendie Simmer, MD [address] [place]  [id number] [phone]   REASON FOR VISIT:  Follow-up for Stage IV endometrial cancer with probable pulmonary metastases AND adenocarcinoma of colon  CURRENT THERAPY: Observation    BRIEF ONCOLOGIC HISTORY:    Endometrial cancer ([facility])   07/12/2014 Imaging    CT C/A/P, pelvic adenopathy, multiple pulmonary nodules concerning for metastatic disease      07/30/2014 Initial Diagnosis    Endometrial cancer      07/31/2014 Pathology Results    endometrioid adenocarcinoma, G2, lymphovascular invasion present, positive pelvic lymph node and vaginal biopsy      07/31/2014 Definitive Surgery    radical hysterectomy, BSO, pelvic lymph node dissection and vaginal biopsies      09/25/2014 Procedure    Port-A-Cath placement in IR      10/18/2014 - 01/10/2015 Chemotherapy    Carboplatin/Taxol. First cycle given at [facility]  S/P 6 cycles total, 5 cycles given at [facility].      07/04/2015 Procedure    Colonoscopy by Dr. Gala Romney      07/04/2015 Pathology Results    Colon, polyp(s), vicinity of hepatic flexure - TUBULAR ADENOMA WITH FOCAL HIGH GRADE DYSPLASIA.       Procedure    Partial colectomy by Dr. Arnoldo Morale scheduled for 08/30/2015      10/31/2015 Genetic Testing    [facility] SYNDROME MSH6 Mutation      04/27/2016 Imaging    CT CAP- No acute process or evidence of metastatic disease in the chest. Right middle lobe pulmonary nodule is unchanged back to 11/20/2014, favoring a benign etiology      11/10/2016 Imaging    CT CAP- 1. No evidence of metastatic disease in the chest, abdomen or pelvis. 2. No evidence of local tumor recurrence at the ileocolic anastomosis in the right abdomen or in the pelvis. Decreased mild fat stranding at the base of the right mesentery, most consistent with postsurgical scarring. 3. Aortic  atherosclerosis.       Adenocarcinoma of colon ([facility])   07/04/2015 Pathology Results    Diagnosis 1. Colon, polyp(s), vicinity of hepatic flexure - TUBULAR ADENOMA WITH FOCAL HIGH GRADE DYSPLASIA. - NO INVASIVE CARCINOMA. 2. Colon, polyp(s), sigmoid - HYPERPLASTIC POLYP. - NO DYSPLASIA OR MALIGNANCY.      07/04/2015 Procedure    Colonoscopoy by Dr. Gala Romney- large polypoid colonic lesion.      08/30/2015 Definitive Surgery    Dr. Arnoldo Morale- right segmental colon resection      08/30/2015 Pathology Results    [id number] intramucosal adenocarcinoma of colon, 0.2 cm inding the laminal propria with negative resection margins and 0/17 lymph nodes.        HISTORY OF PRESENT ILLNESS:  (From Dr. Donald Pore last note on 07/27/16)      INTERVAL HISTORY:  Ms. Welle returns for follow-up for her history of Stage IV endometrial cancer and adenocarcinoma of the colon; she has Lynch Syndrome.   Overall, she feels pretty well.  She endorses having a recent upper respiratory infection and sinus infection that left her ears "feeling full."  She has had severe vertigo in the past month, which has actually resulted in several falls.  She reports that she is fallen at least 4 times the past month; on one occasion she had the coffee table and fell on her right side, injuring her right breast; she saw her PCP,  who thinks she may have a hematoma in her right breast. She reports an additional fall where she "felt through the door."  She attributes these falls to having "swimmy headedness."  Her peripheral neuropathy is more manageable since starting Lyrica; she takes her dose at bedtime. The Lyrica allows her to be more productive during the day and is able to do more housework without many symptoms. Periodically, she uses tramadol as well, to help supplement her pain control in her hands and feet. She is requesting a refill of the tramadol today.  She has not seen her gastroenterologist since 07/2006, to follow-up  after EGD procedure.  She reportedly has not had a colonoscopy since she was diagnosed with colon cancer.    She is continuing to work on cutting down on smoking. She previously smoked 2 packs per day, went down to 1 pack per day, and "now 1 pack lasts me a few days."  Anxiety is one of the biggest triggers for her to use cigarettes; she tries to stay active doing crafts and other activities/distractions to help manage her anxiety, rather than smoking a cigarette. Xanax is also helpful for her as well.   She recently had restaging CT scans and is here to discuss those results.     REVIEW OF SYSTEMS:  Review of Systems  Constitutional: Negative for chills and fever.  HENT:  Negative.  Negative for nosebleeds.   Eyes: Negative.   Respiratory: Positive for cough (cough she attributes to smoking ).   Cardiovascular: Negative.  Negative for chest pain and palpitations.  Gastrointestinal: Positive for nausea. Negative for blood in stool and vomiting.  Endocrine: Negative.   Genitourinary: Positive for bladder incontinence (periodic stress/urge urinary incontinence ). Negative for dysuria and hematuria.   Musculoskeletal: Positive for arthralgias (hand/wrist pain ).  Skin: Negative.  Negative for rash.  Neurological: Positive for dizziness and numbness (peripheral neuropathy ).  Hematological: Negative.   Psychiatric/Behavioral: The patient is nervous/anxious (takes Xanax as needed ).      PAST MEDICAL/SURGICAL HISTORY:  Past Medical History:  Diagnosis Date  . Adenocarcinoma of colon ([facility]) 09/13/2015  . Anemia   . Anxiety   . Asthma   . Cancer ([facility])    endometrial; cancer cells in intestine  . COPD (chronic obstructive pulmonary disease) ([facility])    no definite diagnosis  . Depression   . Diverticulitis   . Dyspareunia 05/16/2015  . Family history of cancer   . Family history of kidney cancer   . GERD (gastroesophageal reflux disease)   . Indigestion   . Lynch syndrome   .  Neuropathy ([facility])    feet and hands  . Uterine fibroid   . Vaginal dryness 05/16/2015  . Vaginal itching 07/16/2015  . Vaginal Pap smear, abnormal    Past Surgical History:  Procedure Laterality Date  . ABDOMINAL HYSTERECTOMY    . APPENDECTOMY    . BIOPSY N/A 03/14/2015   Procedure: BIOPSY;  Surgeon: Daneil Dolin, MD;  Location: [facility];  Service: Endoscopy;  Laterality: N/A;  Gastric  . COLONOSCOPY WITH PROPOFOL N/A 03/14/2015   RMR: Internal hemorrhoids. colonic diverticulosis. Incomplete examination. Prepartation inadequate.  . COLONOSCOPY WITH PROPOFOL N/A 07/04/2015   RMR: Colonic diverticulosis . Large polypoid lesion in the vicinity of the hepatic flexure status post saline-assisted piecmeal snare polypectomy  with ablation and tattooing as described. Sigmoid polyp removed as described above. sigmoid colon polyp hyperplastic, hepatic flexure polyp with TA with focal high grade dysplasia   .  ESOPHAGEAL DILATION N/A 03/14/2015   Procedure: ESOPHAGEAL DILATION;  Surgeon: Daneil Dolin, MD;  Location: [facility];  Service: Endoscopy;  Laterality: N/A;  Maloney 2  . ESOPHAGOGASTRODUODENOSCOPY N/A 05/19/2016   Procedure: ESOPHAGOGASTRODUODENOSCOPY (EGD);  Surgeon: Daneil Dolin, MD;  Location: [facility];  Service: Endoscopy;  Laterality: N/A;  215  . ESOPHAGOGASTRODUODENOSCOPY (EGD) WITH PROPOFOL N/A 03/14/2015   RMR: Mild erosive reflux esophagitis status post passage o f a Maloney dilator. Abnormal gastric mucosa of uncertain significance as described above. status post biopsy, benign  . MALONEY DILATION N/A 05/19/2016   Procedure: [name] DILATION;  Surgeon: Daneil Dolin, MD;  Location: [facility];  Service: Endoscopy;  Laterality: N/A;  . PARTIAL COLECTOMY  08/30/2015   polyp with adenocarcinoma  . POLYPECTOMY N/A 07/04/2015   Procedure: POLYPECTOMY;  Surgeon: Daneil Dolin, MD;  Location: [facility];  Service: Endoscopy;  Laterality: N/A;  . PORTACATH PLACEMENT Right 09/2014  . TUBAL  LIGATION       SOCIAL HISTORY:  Social History   Social History  . Marital status: Single    Spouse name: N/A  . Number of children: 2  . Years of education: N/A   Occupational History  . Not on file.   Social History Main Topics  . Smoking status: Current Every Day Smoker    Packs/day: 0.25    Years: 25.00    Types: Cigarettes  . Smokeless tobacco: Never Used  . Alcohol use No  . Drug use: No  . Sexual activity: Not Currently    Birth control/ protection: Surgical     Comment: hyst   Other Topics Concern  . Not on file   Social History Narrative  . No narrative on file    FAMILY HISTORY:  Family History  Problem Relation Age of Onset  . Other Mother     clot that went to heart, deceased age 36ss  . Heart attack Father     age 67s, deceased  . Stroke Father   . Cancer Father     "at death determined he was ate up with cancer"  . Hypertension Sister   . Kidney cancer Sister 34  . Diabetes Brother   . Hypertension Brother   . Endometriosis Daughter   . Heart attack Maternal Grandfather   . Diabetes Sister   . Brain cancer Paternal Aunt   . Cancer Paternal Uncle     NOS  . Cancer Paternal Uncle     NOS  . Cancer Paternal Uncle     NOS  . Colon cancer Neg Hx     CURRENT MEDICATIONS:  Outpatient Encounter Prescriptions as of 11/16/2016  Medication Sig  . albuterol (PROVENTIL HFA;VENTOLIN HFA) 108 (90 Base) MCG/ACT inhaler Inhale 2 puffs into the lungs every 2 (two) hours as needed for wheezing or shortness of breath (cough).  Marland Kitchen albuterol (PROVENTIL) (2.5 MG/3ML) 0.083% nebulizer solution Take 3 mLs (2.5 mg total) by nebulization every 6 (six) hours as needed for wheezing or shortness of breath.  . ALPRAZolam (XANAX) 0.25 MG tablet Take 0.25 mg by mouth 3 (three) times daily as needed for anxiety.   . citalopram (CELEXA) 20 MG tablet Take 20 mg by mouth daily.  Marland Kitchen dexlansoprazole (DEXILANT) 60 MG capsule Take 1 capsule (60 mg total) by mouth daily.  .  Linaclotide (LINZESS) 145 MCG CAPS capsule Take 1 capsule (145 mcg total) by mouth daily before breakfast.  . ondansetron (ZOFRAN) 8 MG tablet TAKE 1 TABLET  BY MOUTH EVERY 8 HOURS AS NEEDED FOR NAUSEA.  . pregabalin (LYRICA) 75 MG capsule Take 1 capsule PO BID x 1 week then take 1 capsule in am and 2 at HS x 1 week then take 2 capsules BID thereafter  . ranitidine (ZANTAC) 150 MG tablet Take 1 tablet (150 mg total) by mouth at bedtime as needed for heartburn.  . traMADol (ULTRAM) 50 MG tablet Take 1 tablet (50 mg total) by mouth every 6 (six) hours as needed.  . traZODone (DESYREL) 50 MG tablet Take 50 mg by mouth at bedtime.  . [DISCONTINUED] traMADol (ULTRAM) 50 MG tablet Take 1 tablet (50 mg total) by mouth every 6 (six) hours as needed.   No facility-administered encounter medications on file as of 11/16/2016.     ALLERGIES:  Allergies  Allergen Reactions  . Codeine Rash     PHYSICAL EXAM:  ECOG Performance status: 1 - Symptomatic, but independent.   Vitals:   11/16/16 1018  BP: (!) 141/77  Pulse: 68  Resp: 16  Temp: 98.1 F (36.7 C)   Filed Weights   11/16/16 1018  Weight: 163 lb 6.4 oz (74.1 kg)    Physical Exam  Constitutional: She is oriented to person, place, and time and well-developed, well-nourished, and in no distress.  HENT:  Head: Normocephalic.  Right Ear: External ear and ear canal normal. Tympanic membrane is bulging (mild bulging; fluid noted within middle ear ). Tympanic membrane is not erythematous.  Left Ear: External ear and ear canal normal. Tympanic membrane is bulging (mild bulging; fluid noted within middle ear ). Tympanic membrane is not erythematous.  Eyes: Pupils are equal, round, and reactive to light. No scleral icterus.  Neck: Normal range of motion. Neck supple.  Cardiovascular: Normal rate, regular rhythm and normal heart sounds.   Pulmonary/Chest: Effort normal and breath sounds normal. No respiratory distress.  Abdominal: Soft. Bowel  sounds are normal. There is no tenderness.  Musculoskeletal: Normal range of motion. She exhibits no edema.  Lymphadenopathy:    She has no cervical adenopathy.  Neurological: She is alert and oriented to person, place, and time. No cranial nerve deficit.  Skin: Skin is warm and dry. No rash noted.  Psychiatric: Mood, memory, affect and judgment normal.     LABORATORY DATA:  I have reviewed the labs as listed.  CBC    Component Value Date/Time   WBC 7.5 10/23/2016 0952   RBC 4.42 10/23/2016 0952   HGB 13.8 10/23/2016 0952   HCT 40.0 10/23/2016 0952   PLT 236 10/23/2016 0952   MCV 90.5 10/23/2016 0952   MCH 31.2 10/23/2016 0952   MCHC 34.5 10/23/2016 0952   RDW 13.3 10/23/2016 0952   LYMPHSABS 2.9 10/23/2016 0952   MONOABS 0.6 10/23/2016 0952   EOSABS 0.1 10/23/2016 0952   BASOSABS 0.0 10/23/2016 0952   CMP Latest Ref Rng & Units 10/23/2016 07/27/2016 04/09/2016  Glucose 65 - 99 mg/dL 111(H) 95 110(H)  BUN 6 - 20 mg/dL _0 Creatinine 0.44 - 1.00 mg/dL 0.96 0.90 0.81  Sodium 135 - 145 mmol/L 138 137 137  Potassium 3.5 - 5.1 mmol/L 3.6 4.3 3.7  Chloride 101 - 111 mmol/L 101 103 105  CO2 22 - 32 mmol/L _1 Calcium 8.9 - 10.3 mg/dL 9.4 9.3 8.5(L)  Total Protein 6.5 - 8.1 g/dL 7.7 7.7 7.3  Total Bilirubin 0.3 - 1.2 mg/dL 0.6 0.4 0.4  Alkaline Phos 38 - 126 U/L 62 66  64  AST 15 - 41 U/L _0 ALT 14 - 54 U/L _1 Results for KHRISTEN, CHEYNEY (MRN [id number])   Ref. Range 08/26/2015 09:30  CEA Latest Ref Range: 0.0 - 4.7 ng/mL 2.1   Results for HERAN, CAMPAU (MRN [id number])   Ref. Range 10/18/2015 11:45 01/16/2016 09:47 04/09/2016 11:07 07/27/2016 10:26 10/23/2016 09:52  CA 125 Latest Ref Range: 0.0 - 38.1 U/mL 17.3 19.3 13.1 16.2 14.9     PENDING LABS:    DIAGNOSTIC IMAGING:  CT chest/abd/pelvis: 11/10/16     PATHOLOGY:  TAH/BSO surgical path [facility]): 08/04/14      Surgical path colon: 08/30/15      ASSESSMENT & PLAN:   Stage IV  endometrial cancer (diagnosed in 07/2014): -s/p TAH/BSO on 07/31/14, followed by adjuvant chemotherapy with Carbo/Taxol x 5 cycles, completed on 01/09/15. -CA-125 remains normal; most recent CA-125 was 14.9 on 10/23/16.  -CT imaging results reviewed in detail with patient; no evidence of metastatic or recurrent disease on CT chest/abd/pelvis.    Adenocarcinoma of colon/Lynch syndrome:  -s/p partial colectomy in 08/2015; CEA not elevated at time of diagnosis (CEA 2.1 on 08/26/15).  -She has not had surveillance colonoscopy since her colon cancer diagnosis.  We will get her back in with GI to have colonoscopy. -Will repeat CEA periodically for assessment.      Vertigo/Frequent falls:  -Low suspicion that her vertigo is secondary to malignancy.  -Recent URI/sinus infection in past month consistent with possible middle ear effusion contributing to likely vertigo.  -Reinforced safety precautions with patient, particularly when changing positions or moving her head too quickly.  -Referral to ENT for further evaluation/management.    Peripheral neuropathy/Arthralgias: -Continue Lyrica as scheduled; Tramadol prn. Refill for Tramadol given today to patient after reviewing [place] Controlled Substance Registry.  Tramadol 50 mg po Q8Hprn, #30, no refills.    Tobacco use disorder:  -We discussed the pathophysiology of nicotine dependence and different strategies to stop smoking. The gold standard of tobacco cessation is nicotine replacement (with nicotine patches & gum/lozenges) or Varenicline (Chantix).  We discussed that the typical craving/urge to smoke lasts about 3-5 minutes; her biggest trigger(s) to use cigarettes is anxiety.  Encouraged her to use a piece of nicotine gum or a lozenge when she has a craving. I gave her instructions on how to use these nicotine replacement products.  Ms.Meriweather  understands that data suggests that "cold [place]" is the least effective way to stop using tobacco products.   Having a clear "quit plan" is much more effective and requires a step-wise approach with continued support from a tobacco treatment specialist.  I encouraged her to reach out to me if she has further questions or interest in her continued cessation efforts.  Greater than 10 minutes was spent in smoking cessation counseling with this patient.       Dispo:  -Refer back to GI for consideration for colonoscopy.  -Refer to ENT for vertigo.  -Return to cancer center in 3 months with CA-125 & CEA.    All questions were answered to patient's stated satisfaction. Encouraged patient to call with any new concerns or questions before her next visit to the cancer center and we can certain see her sooner, if needed.     Orders placed this encounter:  Orders Placed This Encounter  Procedures  . CEA  . CA 125  . CBC with Differential/Platelet  . Comprehensive metabolic panel  Mike Craze, NP [place] [phone]

## 2016-11-16 NOTE — Progress Notes (Unsigned)
Referral to Dr Benjamine Mola. Records faxed on 2/26, the office will review and call the patient with appt.

## 2016-11-16 NOTE — Patient Instructions (Signed)
[facility] at [facility] Discharge Instructions  RECOMMENDATIONS MADE BY THE CONSULTANT AND ANY TEST RESULTS WILL BE SENT TO YOUR REFERRING PHYSICIAN.  You were seen today by Gretchen Dawson NP. Return in 3 months for labs and follow up.   Thank you for choosing [facility] at [facility] to provide your oncology and hematology care.  To afford each patient quality time with our provider, please arrive at least 15 minutes before your scheduled appointment time.    If you have a lab appointment with the Cancer Center please come in thru the  Main Entrance and check in at the main information desk  You need to re-schedule your appointment should you arrive 10 or more minutes late.  We strive to give you quality time with our providers, and arriving late affects you and other patients whose appointments are after yours.  Also, if you no show three or more times for appointments you may be dismissed from the clinic at the providers discretion.     Again, thank you for choosing  Cancer Center.  Our hope is that these requests will decrease the amount of time that you wait before being seen by our physicians.       _____________________________________________________________  Should you have questions after your visit to  Cancer Center, please contact our office at [phone] between the hours of 8:30 a.m. and 4:30 p.m.  Voicemails left after 4:30 p.m. will not be returned until the following business day.  For prescription refill requests, have your pharmacy contact our office.       Resources For Cancer Patients and their Caregivers ? American Cancer Society: Can assist with transportation, wigs, general needs, runs Look Good Feel Better.        1-888-227-6333 ? Cancer Care: Provides financial assistance, online support groups, medication/co-pay assistance.  1-800-813-HOPE (4673) ? [facility] Assists [place] cancer patients and their families through emotional , educational and financial support.  336-427-4357 ? [place] DSS Where to apply for food stamps, Medicaid and utility assistance. 336-342-1394 ? [facility]: Transportation to medical appointments. 336-347-2287 ? Social Security Administration: May apply for disability if have a Stage IV cancer. 336-342-7796 1-800-772-1213 ? [place] Aging, Disability and Transit Services: Assists with nutrition, care and transit needs. 336-349-2343  Cancer Center Support Programs: @10RELATIVEDAYS@ > Cancer Support Group  2nd Tuesday of the month 1pm-2pm, Journey Room  > Creative Journey  3rd Tuesday of the month 1130am-1pm, Journey Room  > Look Good Feel Better  1st Wednesday of the month 10am-12 noon, Journey Room (Call American Cancer Society to register 1-800-395-5775)    

## 2016-11-17 ENCOUNTER — Encounter: Payer: Self-pay | Admitting: Nurse Practitioner

## 2016-11-24 ENCOUNTER — Ambulatory Visit: Payer: Medicaid Other | Admitting: Obstetrics & Gynecology

## 2016-11-26 ENCOUNTER — Other Ambulatory Visit (HOSPITAL_COMMUNITY): Payer: Self-pay | Admitting: Hematology & Oncology

## 2016-11-26 DIAGNOSIS — C189 Malignant neoplasm of colon, unspecified: Secondary | ICD-10-CM

## 2016-11-26 DIAGNOSIS — C541 Malignant neoplasm of endometrium: Secondary | ICD-10-CM

## 2016-12-02 ENCOUNTER — Ambulatory Visit: Payer: Medicaid Other | Admitting: Nurse Practitioner

## 2016-12-21 ENCOUNTER — Other Ambulatory Visit: Payer: Self-pay

## 2016-12-21 ENCOUNTER — Encounter: Payer: Self-pay | Admitting: Nurse Practitioner

## 2016-12-21 ENCOUNTER — Ambulatory Visit (INDEPENDENT_AMBULATORY_CARE_PROVIDER_SITE_OTHER): Payer: Medicare Other | Admitting: Nurse Practitioner

## 2016-12-21 VITALS — BP 141/80 | HR 63 | Temp 97.7°F | Ht 63.0 in | Wt 156.8 lb

## 2016-12-21 DIAGNOSIS — K5900 Constipation, unspecified: Secondary | ICD-10-CM | POA: Diagnosis not present

## 2016-12-21 DIAGNOSIS — Z1509 Genetic susceptibility to other malignant neoplasm: Secondary | ICD-10-CM | POA: Diagnosis not present

## 2016-12-21 DIAGNOSIS — R1310 Dysphagia, unspecified: Secondary | ICD-10-CM

## 2016-12-21 DIAGNOSIS — D490 Neoplasm of unspecified behavior of digestive system: Secondary | ICD-10-CM

## 2016-12-21 DIAGNOSIS — R109 Unspecified abdominal pain: Secondary | ICD-10-CM | POA: Insufficient documentation

## 2016-12-21 DIAGNOSIS — K2100 Gastro-esophageal reflux disease with esophagitis, without bleeding: Secondary | ICD-10-CM

## 2016-12-21 DIAGNOSIS — C19 Malignant neoplasm of rectosigmoid junction: Secondary | ICD-10-CM

## 2016-12-21 MED ORDER — NA SULFATE-K SULFATE-MG SULF 17.5-3.13-1.6 GM/177ML PO SOLN: 1.0000 | ORAL | 0 refills | Status: DC

## 2016-12-21 MED ORDER — LINACLOTIDE 145 MCG PO CAPS: 145.0000 ug | ORAL_CAPSULE | Freq: Every day | ORAL | 5 refills | Status: DC

## 2016-12-21 MED ORDER — SUCRALFATE 1 GM/10ML PO SUSP: 1.0000 g | Freq: Four times a day (QID) | ORAL | 1 refills | Status: DC

## 2016-12-21 NOTE — Assessment & Plan Note (Signed)
Noted solid food dysphagia. She had similar symptoms last year and had significant improvement after dilation. She does have a history of Lynch syndrome as diagnosed by oncology. Given her dysphagia we will proceed with upper endoscopy. She is having exacerbation of seasonal allergies and this could be a factor as well. Also with GERD which she feels is not well controlled also could be contributing. Return for follow-up in 2 months.  Proceed with EGD +/- dilation with Dr. Gala Romney in near future: the risks, benefits, and alternatives have been discussed with the patient in detail. The patient states understanding and desires to proceed.  The patient is currently on Ultram as needed and Xanax as needed. This is related to her cancer treatment. Last endoscopic procedure was completed on conscious sedation. This time I will add 12.5 mg preprocedure Phenergan to promote adequate sedation.

## 2016-12-21 NOTE — Assessment & Plan Note (Signed)
Worsening reflux symptoms. She is currently on Dexilant and Zantac. The only other option at this time is to add Carafate for breakthrough symptoms. I will send this to her pharmacy. Return for follow-up in 2 months. Upper endoscopy will be revealing for further evaluation of any esophagitis, gastritis, duodenitis on PPI and H2 receptor blocker.

## 2016-12-21 NOTE — Assessment & Plan Note (Signed)
Some recent worsening constipation as she ran out of her Linzess couple days ago. I sent in a refill for this. Linzess tends to control her constipation well. Return for follow-up as needed.

## 2016-12-21 NOTE — Progress Notes (Signed)
CC'D TO PCP °

## 2016-12-21 NOTE — Assessment & Plan Note (Signed)
Diagnosed with Lynch syndrome and will need colonoscopy every one to 2 years ongoing for surveillance. I have discussed this with the patient and she is in agreement. We will proceed with colonoscopy as per below. Return for follow-up in 2 months.

## 2016-12-21 NOTE — Assessment & Plan Note (Signed)
The patient has a history of colon cancer which was treated with surgical intervention as well as chemotherapy. No sign of recurrence currently. She is due for her 1 year post colectomy colonoscopy. We will proceed for surveillance purposes. Return for follow-up in 2 months.  Proceed with TCS with Dr. Gala Romney in near future: the risks, benefits, and alternatives have been discussed with the patient in detail. The patient states understanding and desires to proceed.  The patient is currently on Ultram as needed and Xanax as needed. This is related to her cancer treatment. Last endoscopic procedure was completed on conscious sedation. This time I will add 12.5 mg preprocedure Phenergan to promote adequate sedation.

## 2016-12-21 NOTE — Assessment & Plan Note (Signed)
The patient notes left-sided abdominal pain which "feels like bee stings". She thinks this is either scar tissue or as a result of neuropathy from chemotherapy. Regardless, she is due for colonoscopy which will allow [place] to further evaluate for more insidious pathology. We will also be setting her up for an endoscopy as noted above. Return for follow-up in 2 months to follow-up on symptom progression.

## 2016-12-21 NOTE — Progress Notes (Signed)
Referring Provider: Wendie Simmer, MD Primary Care Physician:  Wendie Simmer, MD Primary GI:  Dr. Gala Romney  Chief Complaint  Patient presents with  . Constipation    wants refill for Linzess  . malignant neoplasms of colon    HPI:   Julia Osborne is a 56 y.o. female who presents for follow-up on GERD and dysphagia as well as colon cancer. The patient was last seen in our office 07/23/2016 at which point she was doing well overall, CT scan in August looked good with treatment apparently working. No further dysphagia after esophageal dilation. GERD symptoms doing okay with addition of Zantac in the evenings effective. No other GI symptoms noted.  She is currently being followed by oncology for stage IV endometrial cancer and probable pulmonary metastasis with radical hysterectomy, BSO, pelvic lymph node dissection, vaginal biopsies with extensive pelvic disease including positive lymph nodes and positive distal vaginal biopsy. Also with tubular adenoma with focal high-grade dysplasia status post resection of adenocarcinoma of the colon was 16 benign lymph nodes.   She has been diagnosed with Lynch syndrome. At last office visit with oncology on 11/16/2016 to was noted to be up-to-date in regards to screening related to Lynch syndrome. Her last colonoscopy was completed 07/04/2015. Last EGD completed 05/19/2016. She is likely due for repeat colonoscopy 1 year post colon resection.  Today she states she's doing relatively well. Is having significant seasonal allergies currently. Is having constipation issues related to medications. She took her last Linzess 2 days ago. She recently moved and switched her pharmacies and her medication was not transferred and will need her Rx refill called there. GERD still not well controlled at this point. States "it's been pretty rough here lately." Has solid food dysphagia, typically every day. Thinks it's related to throat getting raw from allergies and  "mouth breathing" at night. Had an EGD last year with dilation and felt this helped at that time. Has left-sided abdominal pain, occasional improvement with bowel movement. Has made dietary changes to help some. Denies hematochezia, melena, unintentional weight loss, fever, chills. Denies chest pain, dyspnea, dizziness, lightheadedness, syncope, near syncope. Denies any other upper or lower GI symptoms.  Past Medical History:  Diagnosis Date  . Adenocarcinoma of colon ([place]) 09/13/2015  . Anemia   . Anxiety   . Asthma   . Cancer ([place])    endometrial; cancer cells in intestine  . COPD (chronic obstructive pulmonary disease) ([place])    no definite diagnosis  . Depression   . Diverticulitis   . Dyspareunia 05/16/2015  . Family history of cancer   . Family history of kidney cancer   . GERD (gastroesophageal reflux disease)   . Indigestion   . Lynch syndrome   . Neuropathy ([facility])    feet and hands  . Uterine fibroid   . Vaginal dryness 05/16/2015  . Vaginal itching 07/16/2015  . Vaginal Pap smear, abnormal     Past Surgical History:  Procedure Laterality Date  . ABDOMINAL HYSTERECTOMY    . APPENDECTOMY    . BIOPSY N/A 03/14/2015   Procedure: BIOPSY;  Surgeon: Daneil Dolin, MD;  Location: [facility];  Service: Endoscopy;  Laterality: N/A;  Gastric  . COLONOSCOPY WITH PROPOFOL N/A 03/14/2015   RMR: Internal hemorrhoids. colonic diverticulosis. Incomplete examination. Prepartation inadequate.  . COLONOSCOPY WITH PROPOFOL N/A 07/04/2015   RMR: Colonic diverticulosis . Large polypoid lesion in the vicinity of the hepatic flexure status post saline-assisted piecmeal snare polypectomy  with ablation  and tattooing as described. Sigmoid polyp removed as described above. sigmoid colon polyp hyperplastic, hepatic flexure polyp with TA with focal high grade dysplasia   . ESOPHAGEAL DILATION N/A 03/14/2015   Procedure: ESOPHAGEAL DILATION;  Surgeon: Daneil Dolin, MD;  Location: [facility];  Service:  Endoscopy;  Laterality: N/A;  Maloney 85  . ESOPHAGOGASTRODUODENOSCOPY N/A 05/19/2016   Procedure: ESOPHAGOGASTRODUODENOSCOPY (EGD);  Surgeon: Daneil Dolin, MD;  Location: [facility];  Service: Endoscopy;  Laterality: N/A;  215  . ESOPHAGOGASTRODUODENOSCOPY (EGD) WITH PROPOFOL N/A 03/14/2015   RMR: Mild erosive reflux esophagitis status post passage o f a Maloney dilator. Abnormal gastric mucosa of uncertain significance as described above. status post biopsy, benign  . MALONEY DILATION N/A 05/19/2016   Procedure: [name] DILATION;  Surgeon: Daneil Dolin, MD;  Location: [facility];  Service: Endoscopy;  Laterality: N/A;  . PARTIAL COLECTOMY  08/30/2015   polyp with adenocarcinoma  . POLYPECTOMY N/A 07/04/2015   Procedure: POLYPECTOMY;  Surgeon: Daneil Dolin, MD;  Location: [facility];  Service: Endoscopy;  Laterality: N/A;  . PORTACATH PLACEMENT Right 09/2014  . TUBAL LIGATION      Current Outpatient Prescriptions  Medication Sig Dispense Refill  . albuterol (PROVENTIL HFA;VENTOLIN HFA) 108 (90 Base) MCG/ACT inhaler Inhale 2 puffs into the lungs every 2 (two) hours as needed for wheezing or shortness of breath (cough). 1 Inhaler 3  . albuterol (PROVENTIL) (2.5 MG/3ML) 0.083% nebulizer solution Take 3 mLs (2.5 mg total) by nebulization every 6 (six) hours as needed for wheezing or shortness of breath. 75 mL 12  . ALPRAZolam (XANAX) 0.25 MG tablet Take 0.25 mg by mouth 3 (three) times daily as needed for anxiety.     . citalopram (CELEXA) 20 MG tablet Take 20 mg by mouth daily.    Marland Kitchen dexlansoprazole (DEXILANT) 60 MG capsule Take 1 capsule (60 mg total) by mouth daily. 90 capsule 3  . linaclotide (LINZESS) 145 MCG CAPS capsule Take 1 capsule (145 mcg total) by mouth daily before breakfast. 30 capsule 5  . ranitidine (ZANTAC) 150 MG tablet Take 1 tablet (150 mg total) by mouth at bedtime as needed for heartburn. 30 tablet 2  . traMADol (ULTRAM) 50 MG tablet TAKE 1 TABLET BY MOUTH EVERY 6 HOURS  AS NEEDED 30 tablet 1  . traZODone (DESYREL) 50 MG tablet Take 50 mg by mouth at bedtime.     No current facility-administered medications for this visit.     Allergies as of 12/21/2016 - Review Complete 12/21/2016  Allergen Reaction Noted  . Codeine Rash 08/25/2011    Family History  Problem Relation Age of Onset  . Other Mother     clot that went to heart, deceased age 14ss  . Heart attack Father     age 36s, deceased  . Stroke Father   . Cancer Father     "at death determined he was ate up with cancer"  . Hypertension Sister   . Kidney cancer Sister 87  . Diabetes Brother   . Hypertension Brother   . Endometriosis Daughter   . Heart attack Maternal Grandfather   . Diabetes Sister   . Brain cancer Paternal Aunt   . Cancer Paternal Uncle     NOS  . Cancer Paternal Uncle     NOS  . Cancer Paternal Uncle     NOS  . Colon cancer Neg Hx     Social History   Social History  . Marital status: Single  Spouse name: N/A  . Number of children: 2  . Years of education: N/A   Social History Main Topics  . Smoking status: Current Every Day Smoker    Packs/day: 0.25    Years: 25.00    Types: Cigarettes  . Smokeless tobacco: Never Used     Comment: Is trying to quit/wean off.  . Alcohol use No  . Drug use: No  . Sexual activity: Not Currently    Birth control/ protection: Surgical     Comment: hyst   Other Topics Concern  . None   Social History Narrative  . None    Review of Systems: General: Negative for anorexia, weight loss, fever, chills, fatigue, weakness. Eyes: Negative for vision changes.  ENT: Negative for hoarseness, difficulty swallowing , nasal congestion. CV: Negative for chest pain, angina, palpitations, dyspnea on exertion, peripheral edema.  Respiratory: Negative for dyspnea at rest, dyspnea on exertion, cough, sputum, wheezing.  GI: See history of present illness. GU:  Negative for dysuria, hematuria, urinary incontinence, urinary  frequency, nocturnal urination.  MS: Negative for joint pain, low back pain.  Derm: Negative for rash or itching.  Neuro: Negative for weakness, abnormal sensation, seizure, frequent headaches, memory loss, confusion.  Psych: Negative for anxiety, depression, suicidal ideation, hallucinations.  Endo: Negative for unusual weight change.  Heme: Negative for bruising or bleeding. Allergy: Negative for rash or hives.   Physical Exam: BP (!) 141/80   Pulse 63   Temp 97.7 F (36.5 C) (Oral)   Ht 5\' 3"  (1.6 m)   Wt 156 lb 12.8 oz (71.1 kg)   BMI 27.78 kg/m  General:   Alert and oriented. Pleasant and cooperative. Well-nourished and well-developed.  Head:  Normocephalic and atraumatic. Eyes:  Without icterus, sclera clear and conjunctiva pink.  Ears:  Normal auditory acuity. Mouth:  No deformity or lesions, oral mucosa pink.  Throat/Neck:  Supple, without mass or thyromegaly. Cardiovascular:  S1, S2 present without murmurs appreciated. Normal pulses noted. Extremities without clubbing or edema. Respiratory:  Clear to auscultation bilaterally. No wheezes, rales, or rhonchi. No distress.  Gastrointestinal:  +BS, soft, non-tender and non-distended. No HSM noted. No guarding or rebound. No masses appreciated.  Rectal:  Deferred  Musculoskalatal:  Symmetrical without gross deformities. Normal posture. Skin:  Intact without significant lesions or rashes. Neurologic:  Alert and oriented x4;  grossly normal neurologically. Psych:  Alert and cooperative. Normal mood and affect. Heme/Lymph/Immune: No significant cervical adenopathy. No excessive bruising noted.    12/21/2016 12:07 PM   Disclaimer: This note was dictated with voice recognition software. Similar sounding words can inadvertently be transcribed and may not be corrected upon review.

## 2016-12-21 NOTE — Patient Instructions (Addendum)
1. At this time you are on optimal therapy for reflux. I will add Carafate that she can take as needed for breakthrough symptoms. 2. We will schedule your procedures for you. 3. Continue to see oncology. 4. Return for follow-up in 2 months.     Lynch Syndrome Lynch syndrome, also called hereditary nonpolyposis colorectal cancer (HNPCC), is a condition that increases a person's risk for developing colorectal cancer before age 54. Lynch syndrome can also increase a person's risk for many other types of cancer, including stomach, small intestine, liver, gallbladder, pancreas, urinary tract, skin, and brain cancers. Women with this condition have a higher risk for developing cancer of the ovaries and cancer in the lining of the uterus (endometrium). What are the causes? This condition is caused by a gene mutation that is inherited from one or both parents. A gene mutation is a harmful change in a gene. Not everyone who inherits the genetic mutation develops cancer. What are the signs or symptoms? There are no symptoms of this condition. However, your health care provider may test you for Lynch syndrome if you:  Have colorectal cancer before age 12.  Have family members diagnosed with colorectal, endometrial, or other types of cancer. How is this diagnosed? This condition is diagnosed with:  A review of your family history of cancer.  A blood test to look for the mutations that cause this condition.  Testing of tumor tissue (biopsy). How is this treated? This condition may be managed with:  Genetic counseling to assess your risk and your options for management.  Regular screening tests for the associated cancers. You may need to have a colonoscopy every 1-2 years starting at an early age.  Daily aspirin therapy.  Preventive surgery to remove sites where cancer can develop, such as the colon, uterus, and ovaries. Follow these instructions at home:  Ask your health care provider about  your risks.  Discuss a referral for genetic counseling. Ask about the risks and benefits of genetic counseling.  Write down any questions you have about your condition.  Follow your plan for cancer screenings as told by your health care provider.  Take over-the-counter and prescription medicines only as told by your health care provider.  Maintain a healthy diet.  Consider joining a support group. This may help you learn to cope with the stress of having Lynch syndrome.  Keep all follow-up visits as told by your health care provider. This is important. Contact a health care provider if:  You develop any new or unusual symptoms.  You develop symptoms of colorectal cancer, such as:  Blood in the stool.  Changes in bowel habits.  Abdominal pain or bloating.  Unexplained weight loss. Summary  Lynch syndrome is caused by an inherited gene mutation. Not everyone who inherits this mutation will develop cancer.  Genetic counseling and blood testing for Lynch syndrome can identify people with the condition.  Regular cancer screening tests are important in managing this condition. This information is not intended to replace advice given to you by your health care provider. Make sure you discuss any questions you have with your health care provider. Document Released: 05/01/2016 Document Revised: 05/07/2016 Document Reviewed: 05/01/2016 Elsevier Interactive Patient Education  2017 [employer].

## 2016-12-29 DIAGNOSIS — F17210 Nicotine dependence, cigarettes, uncomplicated: Secondary | ICD-10-CM | POA: Diagnosis not present

## 2016-12-29 DIAGNOSIS — J038 Acute tonsillitis due to other specified organisms: Secondary | ICD-10-CM | POA: Diagnosis not present

## 2016-12-29 DIAGNOSIS — J302 Other seasonal allergic rhinitis: Secondary | ICD-10-CM | POA: Diagnosis not present

## 2017-01-27 ENCOUNTER — Other Ambulatory Visit (HOSPITAL_COMMUNITY): Payer: Self-pay | Admitting: Oncology

## 2017-01-27 DIAGNOSIS — C541 Malignant neoplasm of endometrium: Secondary | ICD-10-CM

## 2017-01-27 DIAGNOSIS — C189 Malignant neoplasm of colon, unspecified: Secondary | ICD-10-CM

## 2017-01-28 ENCOUNTER — Ambulatory Visit (HOSPITAL_COMMUNITY)
Admission: RE | Admit: 2017-01-28 | Discharge: 2017-01-28 | Disposition: A | Discharge status: Home / Self Care | Payer: Medicare Other | Source: Ambulatory Visit | Attending: Internal Medicine | Admitting: Internal Medicine

## 2017-01-28 ENCOUNTER — Encounter (HOSPITAL_COMMUNITY)
Admission: RE | Disposition: A | Discharge status: Home / Self Care | Payer: Self-pay | Source: Ambulatory Visit | Attending: Internal Medicine

## 2017-01-28 ENCOUNTER — Encounter (HOSPITAL_COMMUNITY): Payer: Self-pay

## 2017-01-28 DIAGNOSIS — K5730 Diverticulosis of large intestine without perforation or abscess without bleeding: Secondary | ICD-10-CM | POA: Diagnosis not present

## 2017-01-28 DIAGNOSIS — Z9851 Tubal ligation status: Secondary | ICD-10-CM | POA: Insufficient documentation

## 2017-01-28 DIAGNOSIS — J449 Chronic obstructive pulmonary disease, unspecified: Secondary | ICD-10-CM | POA: Insufficient documentation

## 2017-01-28 DIAGNOSIS — K210 Gastro-esophageal reflux disease with esophagitis: Secondary | ICD-10-CM | POA: Insufficient documentation

## 2017-01-28 DIAGNOSIS — K2950 Unspecified chronic gastritis without bleeding: Secondary | ICD-10-CM | POA: Insufficient documentation

## 2017-01-28 DIAGNOSIS — R1310 Dysphagia, unspecified: Secondary | ICD-10-CM

## 2017-01-28 DIAGNOSIS — Z79899 Other long term (current) drug therapy: Secondary | ICD-10-CM | POA: Diagnosis not present

## 2017-01-28 DIAGNOSIS — G629 Polyneuropathy, unspecified: Secondary | ICD-10-CM | POA: Diagnosis not present

## 2017-01-28 DIAGNOSIS — Z85038 Personal history of other malignant neoplasm of large intestine: Secondary | ICD-10-CM

## 2017-01-28 DIAGNOSIS — Z9049 Acquired absence of other specified parts of digestive tract: Secondary | ICD-10-CM | POA: Insufficient documentation

## 2017-01-28 DIAGNOSIS — Z7951 Long term (current) use of inhaled steroids: Secondary | ICD-10-CM | POA: Diagnosis not present

## 2017-01-28 DIAGNOSIS — Z8542 Personal history of malignant neoplasm of other parts of uterus: Secondary | ICD-10-CM | POA: Insufficient documentation

## 2017-01-28 DIAGNOSIS — K3189 Other diseases of stomach and duodenum: Secondary | ICD-10-CM | POA: Insufficient documentation

## 2017-01-28 DIAGNOSIS — C19 Malignant neoplasm of rectosigmoid junction: Secondary | ICD-10-CM

## 2017-01-28 DIAGNOSIS — Z1211 Encounter for screening for malignant neoplasm of colon: Secondary | ICD-10-CM | POA: Insufficient documentation

## 2017-01-28 DIAGNOSIS — F17210 Nicotine dependence, cigarettes, uncomplicated: Secondary | ICD-10-CM | POA: Diagnosis not present

## 2017-01-28 DIAGNOSIS — F329 Major depressive disorder, single episode, unspecified: Secondary | ICD-10-CM | POA: Diagnosis not present

## 2017-01-28 DIAGNOSIS — F419 Anxiety disorder, unspecified: Secondary | ICD-10-CM | POA: Diagnosis not present

## 2017-01-28 HISTORY — PX: ESOPHAGOGASTRODUODENOSCOPY: SHX5428

## 2017-01-28 HISTORY — PX: COLONOSCOPY: SHX5424

## 2017-01-28 HISTORY — PX: MALONEY DILATION: SHX5535

## 2017-01-28 SURGERY — COLONOSCOPY
Anesthesia: Moderate Sedation

## 2017-01-28 MED ORDER — MEPERIDINE HCL 100 MG/ML IJ SOLN
INTRAMUSCULAR | Status: AC
  Filled 2017-01-28: qty 2

## 2017-01-28 MED ORDER — MIDAZOLAM HCL 5 MG/5ML IJ SOLN
INTRAMUSCULAR | Status: AC
  Filled 2017-01-28: qty 10

## 2017-01-28 MED ORDER — ONDANSETRON HCL 4 MG/2ML IJ SOLN
INTRAMUSCULAR | Status: AC
  Filled 2017-01-28: qty 2

## 2017-01-28 MED ORDER — LIDOCAINE VISCOUS 2 % MT SOLN
OROMUCOSAL | Status: AC
  Filled 2017-01-28: qty 15

## 2017-01-28 MED ORDER — SODIUM CHLORIDE 0.9 % IV SOLN
INTRAVENOUS | Status: DC
  Administered 2017-01-28: 10:00:00 via INTRAVENOUS

## 2017-01-28 MED ORDER — MEPERIDINE HCL 100 MG/ML IJ SOLN
INTRAMUSCULAR | Status: DC | PRN
  Administered 2017-01-28 (×3): 25 mg via INTRAVENOUS
  Administered 2017-01-28: 50 mg via INTRAVENOUS
  Administered 2017-01-28: 25 mg via INTRAVENOUS

## 2017-01-28 MED ORDER — MIDAZOLAM HCL 5 MG/5ML IJ SOLN
INTRAMUSCULAR | Status: DC | PRN
  Administered 2017-01-28: 2 mg via INTRAVENOUS
  Administered 2017-01-28 (×4): 1 mg via INTRAVENOUS
  Administered 2017-01-28: 2 mg via INTRAVENOUS
  Administered 2017-01-28: 1 mg via INTRAVENOUS

## 2017-01-28 MED ORDER — ONDANSETRON HCL 4 MG/2ML IJ SOLN
INTRAMUSCULAR | Status: DC | PRN
  Administered 2017-01-28: 4 mg via INTRAVENOUS

## 2017-01-28 MED ORDER — STERILE WATER FOR IRRIGATION IR SOLN
Status: DC | PRN
  Administered 2017-01-28: 10:00:00

## 2017-01-28 MED ORDER — LIDOCAINE VISCOUS 2 % MT SOLN
OROMUCOSAL | Status: DC | PRN
  Administered 2017-01-28: 3 mL via OROMUCOSAL

## 2017-01-28 MED ORDER — PROMETHAZINE HCL 25 MG/ML IJ SOLN: 12.5000 mg | Freq: Once | INTRAMUSCULAR | Status: DC

## 2017-01-28 MED ORDER — SODIUM CHLORIDE 0.9% FLUSH
INTRAVENOUS | Status: AC
  Filled 2017-01-28: qty 10

## 2017-01-28 MED ORDER — PROMETHAZINE HCL 25 MG/ML IJ SOLN
INTRAMUSCULAR | Status: AC
Start: 2017-01-28 — End: 2017-01-28
  Administered 2017-01-28: 12.5 mg
  Filled 2017-01-28: qty 1

## 2017-01-28 NOTE — Op Note (Signed)
[facility] Patient Name: Julia Osborne Procedure Date: 01/28/2017 9:43 AM MRN: [id number] Date of Birth: Mar 22, 1961 Attending MD: Norvel Richards , MD CSN: [id number] Age: 56 Admit Type: Outpatient Procedure:                Upper GI endoscopy Indications:              Dysphagia Providers:                Norvel Richards, MD, Jeanann Lewandowsky. Gwenlyn Perking RN, RN,                            Randa Spike, Technician Referring MD:              Medicines:                Midazolam 9 mg IV, Meperidine 125 mg IV,                            Ondansetron 4 mg IV, Promethazine 65.4 mg IV Complications:            No immediate complications. Estimated Blood Loss:     Estimated blood loss was minimal. Estimated blood                            loss was minimal. Procedure:                Pre-Anesthesia Assessment:                           - Prior to the procedure, a History and Physical                            was performed, and patient medications and                            allergies were reviewed. The patient's tolerance of                            previous anesthesia was also reviewed. The risks                            and benefits of the procedure and the sedation                            options and risks were discussed with the patient.                            All questions were answered, and informed consent                            was obtained. Prior Anticoagulants: The patient has                            taken no previous anticoagulant or antiplatelet  agents. ASA Grade Assessment: II - A patient with                            mild systemic disease. After reviewing the risks                            and benefits, the patient was deemed in                            satisfactory condition to undergo the procedure.                           After obtaining informed consent, the endoscope was                            passed under direct  vision. Throughout the                            procedure, the patient's blood pressure, pulse, and                            oxygen saturations were monitored continuously. The                            [id number] ([id number]) scope was introduced through the                            mouth, and advanced to the second part of duodenum.                            The upper GI endoscopy was accomplished without                            difficulty. The patient tolerated the procedure                            well. Scope In: 10:17:24 AM Scope Out: 10:27:10 AM Total Procedure Duration: 0 hours 9 minutes 46 seconds  Findings:      The examined esophagus was normal. 2 cm area of mucosal abnormality on       the posterior body. Some central depression. Nonspecific finding.       Otherwise, remainder stomach appeared normal.      The duodenal bulb and second portion of the duodenum were normal. The       scope was withdrawn. Dilation was performed with a Maloney dilator with       no resistance at 41 Fr. The scope was withdrawn. Dilation was performed       with a Maloney dilator with mild resistance at 56 Fr. The dilation site       was examined following endoscope reinsertion and showed no change.       Estimated blood loss: none. The abnormal gastric mucosa was biopsied       with a cold forceps for histology. Estimated blood loss was minimal. Impression:               -  Normal esophagus. Dilated. Gastric nodule?"biopsied                           - Normal duodenal bulb and second portion of the                            duodenum. Moderate Sedation:      Moderate (conscious) sedation was administered by the endoscopy nurse       and supervised by the endoscopist. The following parameters were       monitored: oxygen saturation, heart rate, blood pressure, respiratory       rate, EKG, adequacy of pulmonary ventilation, and response to care.       Total physician intraservice time was 21  minutes. Recommendation:           - Written discharge instructions were provided to                            the patient.                           - The signs and symptoms of potential delayed                            complications were discussed with the patient.                           - Patient has a contact number available for                            emergencies.                           - Return to normal activities tomorrow.                           - Resume previous diet.                           - Continue present medications.                           - No repeat upper endoscopy.                           - Return to GI office in 3 months. See colonoscopy                            report. Procedure Code(s):        --- Professional ---                           [phone], Moderate sedation services provided by the                            same physician or other qualified health care  professional performing the diagnostic or                            therapeutic service that the sedation supports,                            requiring the presence of an independent trained                            observer to assist in the monitoring of the                            patient's level of consciousness and physiological                            status; initial 15 minutes of intraservice time,                            patient age 35 years or older Diagnosis Code(s):        --- Professional ---                           K31.89, Other diseases of stomach and duodenum                           R13.10, Dysphagia, unspecified CPT copyright 2016 American Medical Association. All rights reserved. The codes documented in this report are preliminary and upon coder review may  be revised to meet current compliance requirements. Cristopher Estimable. Erie Sica, MD Norvel Richards, MD 01/28/2017 11:01:22 AM This report has been signed electronically. Number  of Addenda: 0

## 2017-01-28 NOTE — Op Note (Signed)
[facility] Patient Name: Julia Osborne Procedure Date: 01/28/2017 10:29 AM MRN: [id number] Date of Birth: 1960-10-23 Attending MD: Norvel Richards , MD CSN: [id number] Age: 56 Admit Type: Outpatient Procedure:                Colonoscopy Indications:              High risk colon cancer surveillance: Personal                            history of colon cancer Providers:                Norvel Richards, MD, Gwenlyn Fudge RN, RN,                            Randa Spike, Technician Referring MD:              Medicines:                Midazolam 9 mg IV, Meperidine 150 mg IV,                            Promethazine 87.5 mg IV Complications:            No immediate complications. Estimated Blood Loss:     Estimated blood loss: none. Procedure:                Pre-Anesthesia Assessment:                           - Prior to the procedure, a History and Physical                            was performed, and patient medications and                            allergies were reviewed. The patient's tolerance of                            previous anesthesia was also reviewed. The risks                            and benefits of the procedure and the sedation                            options and risks were discussed with the patient.                            All questions were answered, and informed consent                            was obtained. Prior Anticoagulants: The patient has                            taken no previous anticoagulant or antiplatelet  agents. ASA Grade Assessment: II - A patient with                            mild systemic disease. After reviewing the risks                            and benefits, the patient was deemed in                            satisfactory condition to undergo the procedure.                           - Prior to the procedure, a History and Physical                            was performed, and patient  medications and                            allergies were reviewed. The patient's tolerance of                            previous anesthesia was also reviewed. The risks                            and benefits of the procedure and the sedation                            options and risks were discussed with the patient.                            All questions were answered, and informed consent                            was obtained. Prior Anticoagulants: The patient has                            taken no previous anticoagulant or antiplatelet                            agents. ASA Grade Assessment: IV - A patient with                            severe systemic disease that is a constant threat                            to life. After reviewing the risks and benefits,                            the patient was deemed in satisfactory condition to                            undergo the procedure.  After obtaining informed consent, the colonoscope                            was passed under direct vision. Throughout the                            procedure, the patient's blood pressure, pulse, and                            oxygen saturations were monitored continuously. The                            EC-[id number] ([id number]) scope was introduced through                            the anus and advanced to the the ileocolonic                            anastomosis. The rectum was photographed. The                            colonoscopy was performed without difficulty. The                            patient tolerated the procedure well. The quality                            of the bowel preparation was adequate. Scope In: 10:33:49 AM Scope Out: 10:47:02 AM Total Procedure Duration: 0 hours 13 minutes 13 seconds  Findings:      The perianal and digital rectal examinations were normal.      Scattered small and large-mouthed diverticula were found in the sigmoid        colon and descending colon. Status post right hemicolectomy with       normal-appearing anastomosis.      The exam was otherwise without abnormality on direct and retroflexion       views. Impression:               - Diverticulosis in the sigmoid colon and in the                            descending colon. Status post right hemicolectomy.                           - The examination was otherwise normal on direct                            and retroflexion views.                           - No specimens collected. Moderate Sedation:      Moderate (conscious) sedation was administered by the endoscopy nurse       and supervised by the endoscopist. The following parameters were       monitored: oxygen saturation, heart rate, blood pressure, respiratory  rate, EKG, adequacy of pulmonary ventilation, and response to care.       Total physician intraservice time was 41 minutes. Recommendation:           - Patient has a contact number available for                            emergencies. The signs and symptoms of potential                            delayed complications were discussed with the                            patient. Return to normal activities tomorrow.                            Written discharge instructions were provided to the                            patient.                           - Advance diet as tolerated.                           - Continue present medications.                           - Repeat colonoscopy in 2 years for surveillance.                           - Return to GI office in 3 months. See EGD report. Procedure Code(s):        --- Professional ---                           [phone], Colonoscopy, flexible; diagnostic, including                            collection of specimen(s) by brushing or washing,                            when performed (separate procedure)                           99152, Moderate sedation services provided by the                             same physician or other qualified health care                            professional performing the diagnostic or                            therapeutic service that the sedation supports,  requiring the presence of an independent trained                            observer to assist in the monitoring of the                            patient's level of consciousness and physiological                            status; initial 15 minutes of intraservice time,                            patient age 37 years or older                           [phone], Moderate sedation services; each additional                            15 minutes intraservice time                           [phone], Moderate sedation services; each additional                            15 minutes intraservice time Diagnosis Code(s):        --- Professional ---                           [phone], Personal history of other malignant                            neoplasm of large intestine                           K57.30, Diverticulosis of large intestine without                            perforation or abscess without bleeding CPT copyright 2016 [employer]. All rights reserved. The codes documented in this report are preliminary and upon coder review may  be revised to meet current compliance requirements. Cristopher Estimable. Riley Papin, MD Norvel Richards, MD 01/28/2017 11:15:54 AM This report has been signed electronically. Number of Addenda: 0

## 2017-01-28 NOTE — Discharge Instructions (Addendum)
Colonoscopy Discharge Instructions  Read the instructions outlined below and refer to this sheet in the next few weeks. These discharge instructions provide you with general information on caring for yourself after you leave the hospital. Your doctor may also give you specific instructions. While your treatment has been planned according to the most current medical practices available, unavoidable complications occasionally occur. If you have any problems or questions after discharge, call Dr. Gala Romney at [phone]. ACTIVITY  You may resume your regular activity, but move at a slower pace for the next 24 hours.   Take frequent rest periods for the next 24 hours.   Walking will help get rid of the air and reduce the bloated feeling in your belly (abdomen).   No driving for 24 hours (because of the medicine (anesthesia) used during the test).    Do not sign any important legal documents or operate any machinery for 24 hours (because of the anesthesia used during the test).  NUTRITION  Drink plenty of fluids.   You may resume your normal diet as instructed by your doctor.   Begin with a light meal and progress to your normal diet. Heavy or fried foods are harder to digest and may make you feel sick to your stomach (nauseated).   Avoid alcoholic beverages for 24 hours or as instructed.  MEDICATIONS  You may resume your normal medications unless your doctor tells you otherwise.  WHAT YOU CAN EXPECT TODAY  Some feelings of bloating in the abdomen.   Passage of more gas than usual.   Spotting of blood in your stool or on the toilet paper.  IF YOU HAD POLYPS REMOVED DURING THE COLONOSCOPY:  No aspirin products for 7 days or as instructed.   No alcohol for 7 days or as instructed.   Eat a soft diet for the next 24 hours.  FINDING OUT THE RESULTS OF YOUR TEST Not all test results are available during your visit. If your test results are not back during the visit, make an appointment  with your caregiver to find out the results. Do not assume everything is normal if you have not heard from your caregiver or the medical facility. It is important for you to follow up on all of your test results.  SEEK IMMEDIATE MEDICAL ATTENTION IF:  You have more than a spotting of blood in your stool.   Your belly is swollen (abdominal distention).   You are nauseated or vomiting.   You have a temperature over 101.   You have abdominal pain or discomfort that is severe or gets worse throughout the day.   EGD Discharge instructions Please read the instructions outlined below and refer to this sheet in the next few weeks. These discharge instructions provide you with general information on caring for yourself after you leave the hospital. Your doctor may also give you specific instructions. While your treatment has been planned according to the most current medical practices available, unavoidable complications occasionally occur. If you have any problems or questions after discharge, please call your doctor. ACTIVITY  You may resume your regular activity but move at a slower pace for the next 24 hours.   Take frequent rest periods for the next 24 hours.   Walking will help expel (get rid of) the air and reduce the bloated feeling in your abdomen.   No driving for 24 hours (because of the anesthesia (medicine) used during the test).   You may shower.   Do not sign any  important legal documents or operate any machinery for 24 hours (because of the anesthesia used during the test).  NUTRITION  Drink plenty of fluids.   You may resume your normal diet.   Begin with a light meal and progress to your normal diet.   Avoid alcoholic beverages for 24 hours or as instructed by your caregiver.  MEDICATIONS  You may resume your normal medications unless your caregiver tells you otherwise.  WHAT YOU CAN EXPECT TODAY  You may experience abdominal discomfort such as a feeling of  fullness or gas pains.  FOLLOW-UP  Your doctor will discuss the results of your test with you.  SEEK IMMEDIATE MEDICAL ATTENTION IF ANY OF THE FOLLOWING OCCUR:  Excessive nausea (feeling sick to your stomach) and/or vomiting.   Severe abdominal pain and distention (swelling).   Trouble swallowing.   Temperature over 101 F (37.8 C).   Rectal bleeding or vomiting of blood.    Colon diverticulosis information provided  GERD information provided  Continue Dexilant and Carafate  Further recommendations to follow pending review of pathology report  Office visit with [place] in 3 months  Repeat colonoscopy in 2 years    Diverticulosis Diverticulosis is a condition that develops when small pouches (diverticula) form in the wall of the large intestine (colon). The colon is where water is absorbed and stool is formed. The pouches form when the inside layer of the colon pushes through weak spots in the outer layers of the colon. You may have a few pouches or many of them. What are the causes? The cause of this condition is not known. What increases the risk? The following factors may make you more likely to develop this condition:  Being older than age 76. Your risk for this condition increases with age. Diverticulosis is rare among people younger than age 72. By age 52, many people have it.  Eating a low-fiber diet.  Having frequent constipation.  Being overweight.  Not getting enough exercise.  Smoking.  Taking over-the-counter pain medicines, like aspirin and ibuprofen.  Having a family history of diverticulosis. What are the signs or symptoms? In most people, there are no symptoms of this condition. If you do have symptoms, they may include:  Bloating.  Cramps in the abdomen.  Constipation or diarrhea.  Pain in the lower left side of the abdomen. How is this diagnosed? This condition is most often diagnosed during an exam for other colon problems. Because  diverticulosis usually has no symptoms, it often cannot be diagnosed independently. This condition may be diagnosed by:  Using a flexible scope to examine the colon (colonoscopy).  Taking an X-ray of the colon after dye has been put into the colon (barium enema).  Doing a CT scan. How is this treated? You may not need treatment for this condition if you have never developed an infection related to diverticulosis. If you have had an infection before, treatment may include:  Eating a high-fiber diet. This may include eating more fruits, vegetables, and grains.  Taking a fiber supplement.  Taking a live bacteria supplement (probiotic).  Taking medicine to relax your colon.  Taking antibiotic medicines. Follow these instructions at home:  Drink 6-8 glasses of water or more each day to prevent constipation.  Try not to strain when you have a bowel movement.  If you have had an infection before:  Eat more fiber as directed by your health care provider or your diet and nutrition specialist (dietitian).  Take a fiber supplement  or probiotic, if your health care provider approves.  Take over-the-counter and prescription medicines only as told by your health care provider.  If you were prescribed an antibiotic, take it as told by your health care provider. Do not stop taking the antibiotic even if you start to feel better.  Keep all follow-up visits as told by your health care provider. This is important. Contact a health care provider if:  You have pain in your abdomen.  You have bloating.  You have cramps.  You have not had a bowel movement in 3 days. Get help right away if:  Your pain gets worse.  Your bloating becomes very bad.  You have a fever or chills, and your symptoms suddenly get worse.  You vomit.  You have bowel movements that are bloody or black.  You have bleeding from your rectum. Summary  Diverticulosis is a condition that develops when small pouches  (diverticula) form in the wall of the large intestine (colon).  You may have a few pouches or many of them.  This condition is most often diagnosed during an exam for other colon problems.  If you have had an infection related to diverticulosis, treatment may include increasing the fiber in your diet, taking supplements, or taking medicines. This information is not intended to replace advice given to you by your health care provider. Make sure you discuss any questions you have with your health care provider. Document Released: 06/04/2004 Document Revised: 07/27/2016 Document Reviewed: 07/27/2016 Elsevier Interactive Patient Education  2017 [place].    Gastroesophageal Reflux Disease, Adult Normally, food travels down the esophagus and stays in the stomach to be digested. If a person has gastroesophageal reflux disease (GERD), food and stomach acid move back up into the esophagus. When this happens, the esophagus becomes sore and swollen (inflamed). Over time, GERD can make small holes (ulcers) in the lining of the esophagus. Follow these instructions at home: Diet   Follow a diet as told by your doctor. You may need to avoid foods and drinks such as:  Coffee and tea (with or without caffeine).  Drinks that contain alcohol.  Energy drinks and sports drinks.  Carbonated drinks or sodas.  Chocolate and cocoa.  Peppermint and mint flavorings.  Garlic and onions.  Horseradish.  Spicy and acidic foods, such as peppers, chili powder, curry powder, vinegar, hot sauces, and BBQ sauce.  Citrus fruit juices and citrus fruits, such as oranges, lemons, and limes.  Tomato-based foods, such as red sauce, chili, salsa, and pizza with red sauce.  Fried and fatty foods, such as donuts, french fries, potato chips, and high-fat dressings.  High-fat meats, such as hot dogs, rib eye steak, sausage, ham, and bacon.  High-fat dairy items, such as whole milk, butter, and cream  cheese.  Eat small meals often. Avoid eating large meals.  Avoid drinking large amounts of liquid with your meals.  Avoid eating meals during the 2-3 hours before bedtime.  Avoid lying down right after you eat.  Do not exercise right after you eat. General instructions   Pay attention to any changes in your symptoms.  Take over-the-counter and prescription medicines only as told by your doctor. Do not take aspirin, ibuprofen, or other NSAIDs unless your doctor says it is okay.  Do not use any tobacco products, including cigarettes, chewing tobacco, and e-cigarettes. If you need help quitting, ask your doctor.  Wear loose clothes. Do not wear anything tight around your waist.  Raise (elevate) the  head of your bed about 6 inches (15 cm).  Try to lower your stress. If you need help doing this, ask your doctor.  If you are overweight, lose an amount of weight that is healthy for you. Ask your doctor about a safe weight loss goal.  Keep all follow-up visits as told by your doctor. This is important. Contact a doctor if:  You have new symptoms.  You lose weight and you do not know why it is happening.  You have trouble swallowing, or it hurts to swallow.  You have wheezing or a cough that keeps happening.  Your symptoms do not get better with treatment.  You have a hoarse voice. Get help right away if:  You have pain in your arms, neck, jaw, teeth, or back.  You feel sweaty, dizzy, or light-headed.  You have chest pain or shortness of breath.  You throw up (vomit) and your throw up looks like blood or coffee grounds.  You pass out (faint).  Your poop (stool) is bloody or black.  You cannot swallow, drink, or eat. This information is not intended to replace advice given to you by your health care provider. Make sure you discuss any questions you have with your health care provider. Document Released: 02/24/2008 Document Revised: 02/13/2016 Document Reviewed:  01/02/2015 Elsevier Interactive Patient Education  2017 [employer].

## 2017-01-28 NOTE — H&P (Signed)
_0 @   Primary Care Physician:  System, Pcp Not In Primary Gastroenterologist:  Dr. Gala Romney  Pre-Procedure History & Physical: HPI:  Julia Osborne is a 56 y.o. female here for follow-up EGD to evaluate dysphagia. 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 Negative nodes. History of Lynch syndrome. History of metastatic uterine carcinoma. Here for one-year surveillance colonoscopy.  Past Medical History:  Diagnosis Date  . Adenocarcinoma of colon ([place]) 09/13/2015  . Anemia   . Anxiety   . Asthma   . Cancer ([place])    endometrial; cancer cells in intestine  . COPD (chronic obstructive pulmonary disease) ([place])    no definite diagnosis  . Depression   . Diverticulitis   . Dyspareunia 05/16/2015  . Family history of cancer   . Family history of kidney cancer   . GERD (gastroesophageal reflux disease)   . Indigestion   . Lynch syndrome   . Neuropathy    feet and hands  . Uterine fibroid   . Vaginal dryness 05/16/2015  . Vaginal itching 07/16/2015  . Vaginal Pap smear, abnormal     Past Surgical History:  Procedure Laterality Date  . ABDOMINAL HYSTERECTOMY    . APPENDECTOMY    . BIOPSY N/A 03/14/2015   Procedure: BIOPSY;  Surgeon: Daneil Dolin, MD;  Location: [facility];  Service: Endoscopy;  Laterality: N/A;  Gastric  . COLONOSCOPY WITH PROPOFOL N/A 03/14/2015   RMR: Internal hemorrhoids. colonic diverticulosis. Incomplete examination. Prepartation inadequate.  . COLONOSCOPY WITH PROPOFOL N/A 07/04/2015   RMR: Colonic diverticulosis . Large polypoid lesion in the vicinity of the hepatic flexure status post saline-assisted piecmeal snare polypectomy  with ablation and tattooing as described. Sigmoid polyp removed as described above. sigmoid colon polyp hyperplastic, hepatic flexure polyp with TA with focal high grade dysplasia   . ESOPHAGEAL DILATION N/A 03/14/2015   Procedure: ESOPHAGEAL DILATION;  Surgeon: Daneil Dolin, MD;  Location: [facility];  Service: Endoscopy;  Laterality: N/A;  Maloney 70  . ESOPHAGOGASTRODUODENOSCOPY N/A 05/19/2016   Procedure: ESOPHAGOGASTRODUODENOSCOPY (EGD);  Surgeon: Daneil Dolin, MD;  Location: [facility];  Service: Endoscopy;  Laterality: N/A;  215  . ESOPHAGOGASTRODUODENOSCOPY (EGD) WITH PROPOFOL N/A 03/14/2015   RMR: Mild erosive reflux esophagitis status post passage o f a Maloney dilator. Abnormal gastric mucosa of uncertain significance as described above. status post biopsy, benign  . MALONEY DILATION N/A 05/19/2016   Procedure: [name] DILATION;  Surgeon: Daneil Dolin, MD;  Location: [facility];  Service: Endoscopy;  Laterality: N/A;  . PARTIAL COLECTOMY  08/30/2015   polyp with adenocarcinoma  . POLYPECTOMY N/A 07/04/2015   Procedure: POLYPECTOMY;  Surgeon: Daneil Dolin, MD;  Location: [facility];  Service: Endoscopy;  Laterality: N/A;  . PORTACATH PLACEMENT Right 09/2014  . TUBAL LIGATION      Prior to Admission medications   Medication Sig Start Date End Date Taking? Authorizing Provider  albuterol (PROVENTIL HFA;VENTOLIN HFA) 108 (90 Base) MCG/ACT inhaler Inhale 2 puffs into the lungs every 2 (two) hours as needed for wheezing or shortness of breath (cough). 10/17/15  Yes Annitta Needs, NP  albuterol (PROVENTIL) (2.5 MG/3ML) 0.083% nebulizer solution Take 3 mLs (2.5 mg total) by nebulization every 6 (six) hours as needed for wheezing or shortness of breath. Patient taking differently: Take 2.5 mg by nebulization daily as needed for wheezing or shortness of breath.  01/16/16  Yes Penland, Kelby Fam, MD  ALPRAZolam Duanne Moron) 0.25 MG tablet  Take 0.125 mg by mouth daily.    Yes [provider]  citalopram (CELEXA) 40 MG tablet Take 40 mg by mouth at bedtime. 01/05/17  Yes [provider]  dexlansoprazole (DEXILANT) 60 MG capsule Take 1 capsule (60 mg total) by mouth daily. 02/03/16  Yes Carlis Stable, NP  linaclotide [facility]) 145 MCG CAPS capsule Take 1 capsule (145 mcg total)  by mouth daily before breakfast. 12/21/16  Yes Carlis Stable, NP  loratadine (CLARITIN) 10 MG tablet Take 10 mg by mouth daily.   Yes [provider]  Na Sulfate-K Sulfate-Mg Sulf (SUPREP BOWEL PREP KIT) 17.5-3.13-1.6 GM/180ML SOLN Take 1 kit by mouth as directed. 12/21/16  Yes , Cristopher Estimable, MD  Naphazoline-Pheniramine (ALLERGY EYE OP) Place 1-2 drops into both eyes 2 (two) times daily as needed (ITCHY, WATERY EYES).   Yes [provider]  ondansetron (ZOFRAN) 8 MG tablet Take 8 mg by mouth daily as needed (CAR SICKNESS).   Yes [provider]  oxymetazoline (AFRIN) 0.05 % nasal spray Place 1 spray into both nostrils daily as needed for congestion.   Yes [provider]  sucralfate (CARAFATE) 1 GM/10ML suspension Take 10 mLs (1 g total) by mouth 4 (four) times daily. 12/21/16  Yes Carlis Stable, NP  traMADol (ULTRAM) 50 MG tablet TAKE 1 TABLET BY MOUTH EVERY 6 HOURS AS NEEDED 01/27/17  Yes Kefalas, Manon Hilding, PA-C  traZODone (DESYREL) 50 MG tablet Take 50 mg by mouth at bedtime.   Yes [provider]  ranitidine (ZANTAC) 150 MG tablet Take 1 tablet (150 mg total) by mouth at bedtime as needed for heartburn. Patient not taking: Reported on 01/22/2017 04/22/16   Carlis Stable, NP    Allergies as of 12/21/2016 - Review Complete 12/21/2016  Allergen Reaction Noted  . Codeine Rash 08/25/2011    Family History  Problem Relation Age of Onset  . Other Mother        clot that went to heart, deceased age 18ss  . Heart attack Father        age 59s, deceased  . Stroke Father   . Cancer Father        "at death determined he was ate up with cancer"  . Hypertension Sister   . Kidney cancer Sister 12  . Diabetes Brother   . Hypertension Brother   . Endometriosis Daughter   . Heart attack Maternal Grandfather   . Diabetes Sister   . Brain cancer Paternal Aunt   . Cancer Paternal Uncle        NOS  . Cancer Paternal Uncle        NOS  . Cancer Paternal Uncle         NOS  . Colon cancer Neg Hx     Social History   Social History  . Marital status: Married    Spouse name: N/A  . Number of children: 2  . Years of education: N/A   Occupational History  . Not on file.   Social History Main Topics  . Smoking status: Current Every Day Smoker    Packs/day: 0.25    Years: 25.00    Types: Cigarettes  . Smokeless tobacco: Never Used     Comment: Is trying to quit/wean off.  . Alcohol use No  . Drug use: No  . Sexual activity: Not Currently    Birth control/ protection: Surgical     Comment: hyst   Other Topics Concern  .  Not on file   Social History Narrative  . No narrative on file    Review of Systems: See HPI, otherwise negative ROS  Physical Exam: BP (!) 124/92   Pulse (!) 59   Temp 98.6 F (37 C) (Oral)   Resp 16   Ht 5' 3" (1.6 m)   Wt 157 lb (71.2 kg)   SpO2 100%   BMI 27.81 kg/m  General:   Alert,   pleasant and cooperative in NAD Neck:  Supple; no masses or thyromegaly. No significant cervical adenopathy. Lungs:  Clear throughout to auscultation.   No wheezes, crackles, or rhonchi. No acute distress. Heart:  Regular rate and rhythm; no murmurs, clicks, rubs,  or gallops. Abdomen: Non-distended, normal bowel sounds.  Soft and nontender without appreciable mass or hepatosplenomegaly.  Pulses:  Normal pulses noted. Extremities:  Without clubbing or edema.  Impression:     56 year old lady with recurrent esophageal dysphagia. Prior EGD demonstrated a patent esophagus which responded nicely to empiric Maloney dilation.  Status post right hemicolectomy for high-grade dysplasia polyp not completely removed. She is due for her first postoperative colonoscopy at this time. Not mentioned above, reflux symptoms are fairly well-controlled on Dexilant and Carafate.   Recommendations: I have offered the patient both an EGD/ED and colonoscopy today.  The risks, benefits, limitations, imponderables and alternatives regarding both EGD  and colonoscopy have been reviewed with the patient. Questions have been answered. All parties agreeable.      Notice: This dictation was prepared with Dragon dictation along with smaller phrase technology. Any transcriptional errors that result from this process are unintentional and may not be corrected upon review.

## 2017-01-31 ENCOUNTER — Encounter: Payer: Self-pay | Admitting: Internal Medicine

## 2017-02-01 ENCOUNTER — Telehealth (HOSPITAL_COMMUNITY): Payer: Self-pay | Admitting: *Deleted

## 2017-02-04 ENCOUNTER — Encounter (HOSPITAL_COMMUNITY): Payer: Self-pay | Admitting: Internal Medicine

## 2017-02-11 ENCOUNTER — Other Ambulatory Visit (HOSPITAL_COMMUNITY): Payer: Self-pay | Admitting: *Deleted

## 2017-02-16 ENCOUNTER — Other Ambulatory Visit (HOSPITAL_COMMUNITY): Payer: Self-pay

## 2017-02-16 ENCOUNTER — Ambulatory Visit (HOSPITAL_COMMUNITY): Payer: Self-pay

## 2017-02-22 ENCOUNTER — Ambulatory Visit: Payer: Medicaid Other | Admitting: Nurse Practitioner

## 2017-02-23 ENCOUNTER — Other Ambulatory Visit: Payer: Self-pay | Admitting: Nurse Practitioner

## 2017-03-03 ENCOUNTER — Encounter (HOSPITAL_COMMUNITY): Payer: Medicare Other

## 2017-03-03 ENCOUNTER — Encounter (HOSPITAL_COMMUNITY): Payer: Medicare Other | Attending: Oncology | Admitting: Oncology

## 2017-03-03 ENCOUNTER — Encounter (HOSPITAL_COMMUNITY): Payer: Self-pay | Admitting: Oncology

## 2017-03-03 VITALS — BP 148/76 | HR 70 | Resp 16 | Ht 63.0 in | Wt 153.0 lb

## 2017-03-03 DIAGNOSIS — Z1509 Genetic susceptibility to other malignant neoplasm: Secondary | ICD-10-CM | POA: Diagnosis not present

## 2017-03-03 DIAGNOSIS — Z720 Tobacco use: Secondary | ICD-10-CM | POA: Diagnosis not present

## 2017-03-03 DIAGNOSIS — K219 Gastro-esophageal reflux disease without esophagitis: Secondary | ICD-10-CM | POA: Diagnosis not present

## 2017-03-03 DIAGNOSIS — C189 Malignant neoplasm of colon, unspecified: Secondary | ICD-10-CM

## 2017-03-03 DIAGNOSIS — F17210 Nicotine dependence, cigarettes, uncomplicated: Secondary | ICD-10-CM | POA: Insufficient documentation

## 2017-03-03 DIAGNOSIS — Z885 Allergy status to narcotic agent status: Secondary | ICD-10-CM | POA: Insufficient documentation

## 2017-03-03 DIAGNOSIS — Z79899 Other long term (current) drug therapy: Secondary | ICD-10-CM | POA: Insufficient documentation

## 2017-03-03 DIAGNOSIS — R200 Anesthesia of skin: Secondary | ICD-10-CM | POA: Insufficient documentation

## 2017-03-03 DIAGNOSIS — F329 Major depressive disorder, single episode, unspecified: Secondary | ICD-10-CM | POA: Diagnosis not present

## 2017-03-03 DIAGNOSIS — C541 Malignant neoplasm of endometrium: Secondary | ICD-10-CM

## 2017-03-03 DIAGNOSIS — G620 Drug-induced polyneuropathy: Secondary | ICD-10-CM | POA: Diagnosis not present

## 2017-03-03 DIAGNOSIS — F419 Anxiety disorder, unspecified: Secondary | ICD-10-CM | POA: Diagnosis not present

## 2017-03-03 LAB — CBC WITH DIFFERENTIAL/PLATELET
BASOS ABS: 0 10*3/uL (ref 0.0–0.1)
BASOS PCT: 0 %
EOS ABS: 0.2 10*3/uL (ref 0.0–0.7)
EOS PCT: 2 %
HCT: 37.4 % (ref 36.0–46.0)
Hemoglobin: 12.7 g/dL (ref 12.0–15.0)
Lymphocytes Relative: 29 %
Lymphs Abs: 2.6 10*3/uL (ref 0.7–4.0)
MCH: 30.7 pg (ref 26.0–34.0)
MCHC: 34 g/dL (ref 30.0–36.0)
MCV: 90.3 fL (ref 78.0–100.0)
MONO ABS: 0.6 10*3/uL (ref 0.1–1.0)
MONOS PCT: 7 %
NEUTROS ABS: 5.5 10*3/uL (ref 1.7–7.7)
Neutrophils Relative %: 62 %
PLATELETS: 243 10*3/uL (ref 150–400)
RBC: 4.14 MIL/uL (ref 3.87–5.11)
RDW: 14 % (ref 11.5–15.5)
WBC: 8.8 10*3/uL (ref 4.0–10.5)

## 2017-03-03 LAB — COMPREHENSIVE METABOLIC PANEL
ALBUMIN: 3.9 g/dL (ref 3.5–5.0)
ALT: 21 U/L (ref 14–54)
ANION GAP: 10 (ref 5–15)
AST: 20 U/L (ref 15–41)
Alkaline Phosphatase: 61 U/L (ref 38–126)
BILIRUBIN TOTAL: 0.4 mg/dL (ref 0.3–1.2)
BUN: 11 mg/dL (ref 6–20)
CHLORIDE: 102 mmol/L (ref 101–111)
CO2: 26 mmol/L (ref 22–32)
Calcium: 9.1 mg/dL (ref 8.9–10.3)
Creatinine, Ser: 0.8 mg/dL (ref 0.44–1.00)
GFR calc Af Amer: >60 mL/min (ref >60)
GFR calc non Af Amer: >60 mL/min (ref >60)
GLUCOSE: 121 mg/dL — AB (ref 65–99)
POTASSIUM: 3.4 mmol/L — AB (ref 3.5–5.1)
Sodium: 138 mmol/L (ref 135–145)
TOTAL PROTEIN: 7.3 g/dL (ref 6.5–8.1)

## 2017-03-03 NOTE — Progress Notes (Signed)
[place] PROGRESS NOTE  CHIEF COMPLAINTS:  Stage IV Endometrial Cancer, probable pulmonary metastases. Radical hysterectomy, BSO, pelvic LN dissection and vaginal biopsies 07/31/2014, patient had extensive pelvic disease including positive LN and positive distal vaginal biopsy  CA-125 137.41 on 07/19/2014 CA-125 25.72 on 09/27/2014  (normal range 0-20.9U/ml)  Tubular adenoma with focal high grade dysplasia on C-scope 07/04/2015 R Hemicolectomy with Dr. Arnoldo Morale on 08/30/2015 intramucosal adenocarcinoma of colon, 16 benign LN Genetics Referral 10/03/2015    Endometrial cancer ([place])   07/12/2014 Imaging    CT C/A/P, pelvic adenopathy, multiple pulmonary nodules concerning for metastatic disease      07/30/2014 Initial Diagnosis    Endometrial cancer      07/31/2014 Pathology Results    endometrioid adenocarcinoma, G2, lymphovascular invasion present, positive pelvic lymph node and vaginal biopsy      07/31/2014 Definitive Surgery    radical hysterectomy, BSO, pelvic lymph node dissection and vaginal biopsies      09/25/2014 Procedure    Port-A-Cath placement in IR      10/18/2014 - 01/10/2015 Chemotherapy    Carboplatin/Taxol. First cycle given at [facility].  S/P 6 cycles total, 5 cycles given at [facility].      07/04/2015 Procedure    Colonoscopy by Dr. Gala Romney      07/04/2015 Pathology Results    Colon, polyp(s), vicinity of hepatic flexure - TUBULAR ADENOMA WITH FOCAL HIGH GRADE DYSPLASIA.       Procedure    Partial colectomy by Dr. Arnoldo Morale scheduled for 08/30/2015      10/31/2015 Genetic Testing    [facility] SYNDROME MSH6 Mutation      04/27/2016 Imaging    CT CAP- No acute process or evidence of metastatic disease in the chest. Right middle lobe pulmonary nodule is unchanged back to 11/20/2014, favoring a benign etiology      11/10/2016 Imaging    CT CAP- 1. No evidence of metastatic disease in the chest, abdomen or pelvis. 2. No evidence of local tumor recurrence  at the ileocolic anastomosis in the right abdomen or in the pelvis. Decreased mild fat stranding at the base of the right mesentery, most consistent with postsurgical scarring. 3. Aortic atherosclerosis.       Adenocarcinoma of colon ([place])   07/04/2015 Pathology Results    Diagnosis 1. Colon, polyp(s), vicinity of hepatic flexure - TUBULAR ADENOMA WITH FOCAL HIGH GRADE DYSPLASIA. - NO INVASIVE CARCINOMA. 2. Colon, polyp(s), sigmoid - HYPERPLASTIC POLYP. - NO DYSPLASIA OR MALIGNANCY.      07/04/2015 Procedure    Colonoscopoy by Dr. Gala Romney- large polypoid colonic lesion.      08/30/2015 Definitive Surgery    Dr. Arnoldo Morale- right segmental colon resection      08/30/2015 Pathology Results    [id number] intramucosal adenocarcinoma of colon, 0.2 cm inding the laminal propria with negative resection margins and 0/17 lymph nodes.       HISTORY OF PRESENTING ILLNESS:   Julia Osborne 56 y.o. female is here because of stage IV endometrial cancer and an intramucosal adenocarcinoma of the colon. She has completed all therapy.  She has been diagnosed with Lynch Syndrome.   Ms. Hadley returns to the [employer] today unaccompanied.  She is feeling good overall. She has been working on her house, painting both the inside and out. States her mood is fine. She has been feeling bored, but keeps herself busy. She stays in contact and visits her niece and grandkids.   She is requesting a refill of  her tramadol. She is almost out of gabapentin but is not sure if she should refill it. She would like to try Lyrica instead. She complains of numbness and burning in her hands, especially while driving. "That's the only frustrating part about any of it".   She is doing pretty well on smoking by not smoking nearly as much as she used to. A pack usually lasts her a couple days now.   Her appetite is good. She denies any abdominal pain, new cough, or breathing issue.  She is up to date on her colonoscopy and  mammogram. She has received a flu shot this year. SHE  is here for ongoing evaluation and treatment consideration Continue to have pain secondary to neuropathy and using tramadol to control pain Patient continues to smoke   MEDICAL HISTORY:  Past Medical History:  Diagnosis Date  . Adenocarcinoma of colon ([place]) 09/13/2015  . Anemia   . Anxiety   . Asthma   . Cancer ([place])    endometrial; cancer cells in intestine  . COPD (chronic obstructive pulmonary disease) ([place])    no definite diagnosis  . Depression   . Diverticulitis   . Dyspareunia 05/16/2015  . Family history of cancer   . Family history of kidney cancer   . GERD (gastroesophageal reflux disease)   . Indigestion   . Lynch syndrome   . Neuropathy    feet and hands  . Uterine fibroid   . Vaginal dryness 05/16/2015  . Vaginal itching 07/16/2015  . Vaginal Pap smear, abnormal     SURGICAL HISTORY: Past Surgical History:  Procedure Laterality Date  . ABDOMINAL HYSTERECTOMY    . APPENDECTOMY    . BIOPSY N/A 03/14/2015   Procedure: BIOPSY;  Surgeon: Daneil Dolin, MD;  Location: [facility];  Service: Endoscopy;  Laterality: N/A;  Gastric  . COLONOSCOPY N/A 01/28/2017   Procedure: COLONOSCOPY;  Surgeon: Daneil Dolin, MD;  Location: [facility] END[facility] SUITE;  Service: Endoscopy;  Laterality: N/A;  11:30am  . COLONOSCOPY WITH PROPOFOL N/A 03/14/2015   RMR: Internal hemorrhoids. colonic diverticulosis. Incomplete examination. Prepartation inadequate.  . COLONOSCOPY WITH PROPOFOL N/A 07/04/2015   RMR: Colonic diverticulosis . Large polypoid lesion in the vicinity of the hepatic flexure status post saline-assisted piecmeal snare polypectomy  with ablation and tattooing as described. Sigmoid polyp removed as described above. sigmoid colon polyp hyperplastic, hepatic flexure polyp with TA with focal high grade dysplasia   . ESOPHAGEAL DILATION N/A 03/14/2015   Procedure: ESOPHAGEAL DILATION;  Surgeon: Daneil Dolin, MD;  Location: [facility];   Service: Endoscopy;  Laterality: N/A;  Maloney 16  . ESOPHAGOGASTRODUODENOSCOPY N/A 05/19/2016   Procedure: ESOPHAGOGASTRODUODENOSCOPY (EGD);  Surgeon: Daneil Dolin, MD;  Location: [facility];  Service: Endoscopy;  Laterality: N/A;  215  . ESOPHAGOGASTRODUODENOSCOPY N/A 01/28/2017   Procedure: ESOPHAGOGASTRODUODENOSCOPY (EGD);  Surgeon: Daneil Dolin, MD;  [facility];  Service: Endoscopy;  Laterality: N/A;  . ESOPHAGOGASTRODUODENOSCOPY (EGD) WITH PROPOFOL N/A 03/14/2015   RMR: Mild erosive reflux esophagitis status post passage o f a Maloney dilator. Abnormal gastric mucosa of uncertain significance as described above. status post biopsy, benign  . MALONEY DILATION N/A 05/19/2016   Procedure: [name] DILATION;  Surgeon: Daneil Dolin, MD;  [facility];  Service: Endoscopy;  Laterality: N/A;  . [name] DILATION N/A 01/28/2017   Procedure: [name] DILATION;  Surgeon: Daneil Dolin, MD;  [facility];  Service: Endoscopy;  Laterality: N/A;  .  PARTIAL COLECTOMY  08/30/2015   polyp with adenocarcinoma  . POLYPECTOMY N/A 07/04/2015   Procedure: POLYPECTOMY;  Surgeon: Daneil Dolin, MD;  Location: [facility];  Service: Endoscopy;  Laterality: N/A;  . PORTACATH PLACEMENT Right 09/2014  . TUBAL LIGATION      SOCIAL HISTORY: Social History   Social History  . Marital status: Married    Spouse name: N/A  . Number of children: 2  . Years of education: N/A   Occupational History  . Not on file.   Social History Main Topics  . Smoking status: Current Every Day Smoker    Packs/day: 0.25    Years: 25.00    Types: Cigarettes  . Smokeless tobacco: Never Used     Comment: Is trying to quit/wean off.  . Alcohol use No  . Drug use: No  . Sexual activity: Not Currently    Birth control/ protection: Surgical     Comment: hyst   Other Topics Concern  . Not on file   Social History Narrative  . No narrative on file  She was working, but now has applied for  SSI and disability. She is now living with her niece. 2 children. 3 grandchildren, one is deceased. Divorced. Smoker 1/2 ppd. Trying to cut back.  Rare alcohol use.   FAMILY HISTORY: Family History  Problem Relation Age of Onset  . Other Mother        clot that went to heart, deceased age 27ss  . Heart attack Father        age 34s, deceased  . Stroke Father   . Cancer Father        "at death determined he was ate up with cancer"  . Hypertension Sister   . Kidney cancer Sister 21  . Diabetes Brother   . Hypertension Brother   . Endometriosis Daughter   . Heart attack Maternal Grandfather   . Diabetes Sister   . Brain cancer Paternal Aunt   . Cancer Paternal Uncle        NOS  . Cancer Paternal Uncle        NOS  . Cancer Paternal Uncle        NOS  . Colon cancer Neg Hx    indicated that her mother is deceased. She indicated that her father is deceased. She indicated that both of her sisters are alive. She indicated that both of her brothers are alive. She indicated that her maternal grandmother is deceased. She indicated that her maternal grandfather is deceased. She indicated that her paternal grandmother is deceased. She indicated that her paternal grandfather is deceased. She indicated that her daughter is alive. She indicated that her son is alive. She indicated that her maternal aunt is alive. She indicated that her maternal uncle is alive. She indicated that her paternal aunt is deceased. She indicated that all of her three paternal uncles are deceased. She indicated that the status of her neg hx is unknown.     Mother deceased at 55 from embolus Father deceased at 63 from MI 2 sisters and 2 brothers, Oldest sister had nephrectomy for RCC.   ALLERGIES:  is allergic to codeine.  MEDICATIONS:  Current Outpatient Prescriptions  Medication Sig Dispense Refill  . albuterol (PROVENTIL HFA;VENTOLIN HFA) 108 (90 Base) MCG/ACT inhaler Inhale 2 puffs into the lungs every 2 (two)  hours as needed for wheezing or shortness of breath (cough). 1 Inhaler 3  . albuterol (PROVENTIL) (2.5 MG/3ML) 0.083% nebulizer solution Take  3 mLs (2.5 mg total) by nebulization every 6 (six) hours as needed for wheezing or shortness of breath. (Patient taking differently: Take 2.5 mg by nebulization daily as needed for wheezing or shortness of breath. ) 75 mL 12  . ALPRAZolam (XANAX) 0.25 MG tablet Take 0.125 mg by mouth daily.     . citalopram (CELEXA) 40 MG tablet Take 40 mg by mouth at bedtime.  2  . DEXILANT 60 MG capsule TAKE ONE CAPSULE BY MOUTH DAILY 90 capsule 3  . linaclotide (LINZESS) 145 MCG CAPS capsule Take 1 capsule (145 mcg total) by mouth daily before breakfast. 30 capsule 5  . loratadine (CLARITIN) 10 MG tablet Take 10 mg by mouth daily.    . Na Sulfate-K Sulfate-Mg Sulf (SUPREP BOWEL PREP KIT) 17.5-3.13-1.6 GM/180ML SOLN Take 1 kit by mouth as directed. 1 Bottle 0  . Naphazoline-Pheniramine (ALLERGY EYE OP) Place 1-2 drops into both eyes 2 (two) times daily as needed (ITCHY, WATERY EYES).    . ondansetron (ZOFRAN) 8 MG tablet Take 8 mg by mouth daily as needed (CAR SICKNESS).    Marland Kitchen oxymetazoline (AFRIN) 0.05 % nasal spray Place 1 spray into both nostrils daily as needed for congestion.    . sucralfate (CARAFATE) 1 GM/10ML suspension Take 10 mLs (1 g total) by mouth 4 (four) times daily. 420 mL 1  . traMADol (ULTRAM) 50 MG tablet TAKE 1 TABLET BY MOUTH EVERY 6 HOURS AS NEEDED 30 tablet 1  . traZODone (DESYREL) 50 MG tablet Take 50 mg by mouth at bedtime.     No current facility-administered medications for this visit.     Review of Systems  Review of Systems  Constitutional: Negative.   HENT: Negative.   Eyes: Negative.   Respiratory: Negative.  Negative for cough and shortness of breath.   Cardiovascular: Negative.   Gastrointestinal: Negative.  Negative for abdominal pain.  Genitourinary: Negative.   Musculoskeletal: Negative.   Skin: Negative.   Neurological:  Positive for tingling.       Numbness and burning in her hands  Endo/Heme/Allergies: Negative.   Psychiatric/Behavioral: Negative.   All other systems reviewed and are negative. 14 point review of systems was performed and is negative except as detailed under history of present illness and above   PHYSICAL EXAMINATION:  ECOG PERFORMANCE STATUS: 1 - Symptomatic but completely ambulatory  There were no vitals filed for this visit. There were no vitals filed for this visit. Physical Exam  Constitutional: She is oriented to person, place, and time and well-developed, well-nourished, and in no distress.  HENT:  Head: Normocephalic and atraumatic.  Nose: Nose normal.  Mouth/Throat: Oropharynx is clear and moist. No oropharyngeal exudate.  Eyes: Conjunctivae and EOM are normal. Pupils are equal, round, and reactive to light. Right eye exhibits no discharge. Left eye exhibits no discharge. No scleral icterus.  Neck: Normal range of motion. Neck supple. No tracheal deviation present. No thyromegaly present.  Cardiovascular: Normal rate, regular rhythm and normal heart sounds.  Exam reveals no gallop and no friction rub.   No murmur heard. Pulmonary/Chest: Effort normal and breath sounds normal. She has no wheezes. She has no rales.  Abdominal: Soft. Bowel sounds are normal. She exhibits no distension and no mass. There is no tenderness. There is no rebound and no guarding.  Musculoskeletal: Normal range of motion. She exhibits no edema.  Lymphadenopathy:    She has no cervical adenopathy.  Neurological: She is alert and oriented to person, place, and time.  She has normal reflexes. No cranial nerve deficit. Gait normal. Coordination normal.  Skin: Skin is warm and dry. No rash noted.  Psychiatric: Mood, memory, affect and judgment normal.  Nursing note and vitals reviewed.   LABORATORY DATA:  I have reviewed the data as listed Lab Results  Component Value Date   WBC 8.8 03/03/2017   HGB  12.7 03/03/2017   HCT 37.4 03/03/2017   MCV 90.3 03/03/2017   PLT 243 03/03/2017     Chemistry      Component Value Date/Time   NA 138 10/23/2016 0952   K 3.6 10/23/2016 0952   CL 101 10/23/2016 0952   CO2 28 10/23/2016 0952   BUN 11 10/23/2016 0952   CREATININE 0.96 10/23/2016 0952      Component Value Date/Time   CALCIUM 9.4 10/23/2016 0952   ALKPHOS 62 10/23/2016 0952   AST 17 10/23/2016 0952   ALT 18 10/23/2016 0952   BILITOT 0.6 10/23/2016 0952     Results for DELLENE, MCGROARTY (MRN [id number])   Ref. Range 02/08/2015 12:41 06/28/2015 11:39 10/18/2015 11:45 01/16/2016 09:47 04/09/2016 11:07  CA 125 Latest Ref Range: 0.0 - 38.1 U/mL 14.6 15.8 17.3 19.3 13.1    RADIOLOGY: I have reviewed the images listed below and agree with the results Study Result   CLINICAL DATA:  Staging of endometrial cancer and colon cancer. Hysterectomy. Partial colon resection. Prior chemotherapy.  EXAM: CT CHEST, ABDOMEN, AND PELVIS WITH CONTRAST  TECHNIQUE: Multidetector CT imaging of the chest, abdomen and pelvis was performed following the standard protocol during bolus administration of intravenous contrast.  CONTRAST:  131m ISOVUE-300 IOPAMIDOL (ISOVUE-300) INJECTION 61%  COMPARISON:  Chest radiograph 12/21/2015. Abdominal pelvic CT of 10/18/2015. Chest CT 07/22/2015.  FINDINGS: CT CHEST FINDINGS  Cardiovascular: A right Port-A-Cath which terminates at the superior caval/ atrial junction. Normal heart size, without pericardial effusion. No central pulmonary embolism, on this non-dedicated study.  Mediastinum/Nodes: No supraclavicular adenopathy. No mediastinal or hilar adenopathy.  Lungs/Pleura: No pleural fluid. Anterior right middle lobe lung nodule is unchanged at 6 mm on image 93/series 4.  Musculoskeletal: No acute osseous abnormality.  CT ABDOMEN PELVIS FINDINGS  Hepatobiliary: Normal liver. Normal gallbladder, without biliary ductal  dilatation.  Pancreas: Normal, without mass or ductal dilatation.  Spleen: Normal in size, without focal abnormality.  Adrenals/Urinary Tract: Normal adrenal glands. Normal kidneys, without hydronephrosis. Normal urinary bladder. [place]  Stomach/Bowel: Normal stomach, without wall thickening. Status post partial right hemicolectomy. Re- demonstration of interstitial thickening within the base of the ileocolic mesentery. This is slightly more distinct and well-defined, measuring 3.2 x 1.8 cm on image 72/ series 2. No well-defined solid component identified.  Small bowel otherwise unremarkable.  Vascular/Lymphatic: Advanced aortic and branch vessel atherosclerosis. Similar small retroperitoneal nodes. None are pathologic by size criteria. No pelvic sidewall adenopathy.  Reproductive: Hysterectomy.  No adnexal mass.  Other: No significant free fluid. No well-defined omental/peritoneal disease. Mild abdominal wall laxity.  Musculoskeletal: Left iliac bone island. Degenerative disc disease at the lumbosacral junction.  IMPRESSION: 1.  No acute process or evidence of metastatic disease in the chest. 2. Status post right partial hemicolectomy and hysterectomy. Edema within the base of the ileocolic mesentery is favored to be postoperative. Although this is felt unlikely to represent peritoneal metastasis, given slight increase in definition today, followup attention is recommended. 3. Right middle lobe pulmonary nodule is unchanged back to 11/20/2014, favoring a benign etiology   Electronically Signed   By: KAdria DevonD.  On: 04/27/2016 15:35    PATHOLOGY:    ASSESSMENT & PLAN:  Stage IV Endometrial Carcinoma diagnosed at [place] Abuse Nausea Chemotherapy induced neuropathy Tubular adenoma with focal high grade dysplasia on C-scope 07/04/2015 R Hemicolectomy with Dr. Arnoldo Morale on 08/30/2015 intramucosal adenocarcinoma of colon, 16 benign  LN Bronchitis/Seasonal allergies Lynch Syndrome  We are waiting for CEA and CA-125 result On clinical ground there is no evidence of recurrent disease patient was advised to go back on Lyrica to control neuropathy pain She is getting her regular mammogram done RE Valuation in 4 months Patient was counseled regarding smoking sensation program       No orders of the defined types were placed in this encounter.   All questions were answered. The patient knows to call the clinic with any problems, questions or concerns.    Forest Gleason, MD 03/03/2017 3:07 PM

## 2017-03-03 NOTE — Patient Instructions (Addendum)
[facility] at [facility] Discharge Instructions  RECOMMENDATIONS MADE BY THE CONSULTANT AND ANY TEST RESULTS WILL BE SENT TO YOUR REFERRING PHYSICIAN.  You were seen today by Dr. Choksi. Return in 3 months for labs and follow up.   Thank you for choosing [facility] at [facility] to provide your oncology and hematology care.  To afford each patient quality time with our provider, please arrive at least 15 minutes before your scheduled appointment time.    If you have a lab appointment with the Cancer Center please come in thru the  Main Entrance and check in at the main information desk  You need to re-schedule your appointment should you arrive 10 or more minutes late.  We strive to give you quality time with our providers, and arriving late affects you and other patients whose appointments are after yours.  Also, if you no show three or more times for appointments you may be dismissed from the clinic at the providers discretion.     Again, thank you for choosing [facility].  Our hope is that these requests will decrease the amount of time that you wait before being seen by our physicians.       _____________________________________________________________  Should you have questions after your visit to  Cancer Center, please contact our office at [phone] between the hours of 8:30 a.m. and 4:30 p.m.  Voicemails left after 4:30 p.m. will not be returned until the following business day.  For prescription refill requests, have your pharmacy contact our office.       Resources For Cancer Patients and their Caregivers ? American Cancer Society: Can assist with transportation, wigs, general needs, runs Look Good Feel Better.        1-888-227-6333 ? Cancer Care: Provides financial assistance, online support groups, medication/co-pay assistance.  1-800-813-HOPE (4673) ? [facility] Assists  [place] cancer patients and their families through emotional , educational and financial support.  336-427-4357 ? [place] DSS Where to apply for food stamps, Medicaid and utility assistance. 336-342-1394 ? [facility]: Transportation to medical appointments. 336-347-2287 ? Social Security Administration: May apply for disability if have a Stage IV cancer. 336-342-7796 1-800-772-1213 ? [place] Aging, Disability and Transit Services: Assists with nutrition, care and transit needs. 336-349-2343  Cancer Center Support Programs: @10RELATIVEDAYS@ > Cancer Support Group  2nd Tuesday of the month 1pm-2pm, Journey Room  > Creative Journey  3rd Tuesday of the month 1130am-1pm, Journey Room  > Look Good Feel Better  1st Wednesday of the month 10am-12 noon, Journey Room (Call American Cancer Society to register 1-800-395-5775)    

## 2017-03-04 LAB — CEA: CEA: 3.3 ng/mL (ref 0.0–4.7)

## 2017-03-04 LAB — CA 125: CA 125: 13.6 U/mL (ref 0.0–38.1)

## 2017-04-13 ENCOUNTER — Ambulatory Visit: Payer: Medicaid Other | Admitting: Nurse Practitioner

## 2017-04-13 ENCOUNTER — Encounter: Payer: Self-pay | Admitting: Nurse Practitioner

## 2017-04-13 ENCOUNTER — Telehealth: Payer: Self-pay | Admitting: Nurse Practitioner

## 2017-04-13 NOTE — Telephone Encounter (Signed)
Noted  

## 2017-04-13 NOTE — Telephone Encounter (Signed)
PATIENT WAS A NO SHOW AND LETTER SENT  °

## 2017-04-13 NOTE — Progress Notes (Deleted)
Referring Provider: Wendie Simmer, MD Primary Care Physician:  System, Pcp Not In Primary GI:  Dr. Gala Romney  No chief complaint on file.   HPI:   Julia Osborne is a 56 y.o. female who presents for follow-up on dysphagia and colorectal cancer. The patient was last seen in our office 12/21/2016 for colon neoplasm, abdominal pain, dysphagia, Lynch syndrome, constipation.   She is currently being followed by oncology for stage IV endometrial cancer and probable pulmonary metastasis with radical hysterectomy, BSO, pelvic lymph node dissection, vaginal biopsies with extensive pelvic disease including positive lymph nodes and positive distal vaginal biopsy. Also with tubular adenoma with focal high-grade dysplasia status post resection of adenocarcinoma of the colon was 16 benign lymph nodes.   She has been diagnosed with Lynch syndrome. Noted due for repeat colonoscopy one year status post colon resection. At her last office visit she was doing relatively well and required a refill for Linzess. GERD not well controlled at her last visit. Noted solid food dysphagia typically daily and thinks is related to her throat getting raw from allergies" mouth breathing" at night. Last EGD the previous year with dilation and that helped at that time. Left-sided abdominal pain which improves with bowel movement and occurs intermittently. Recommended adding Carafate to her regimen, colonoscopy, upper endoscopy with possible dilation.  Colonoscopy and EGD completed 01/28/2017. Upper GI found normal esophagus status post dilation and biopsy of gastric nodule, normal duodenum. Surgical pathology found biopsy to be oxyntic mucosa with slight gastritis changes, no dysplasia or malignancy. Colonoscopy found diverticulosis in the sigmoid colon and descending colon, status post right hemicolectomy. Exam otherwise normal. Recommended repeat colonoscopy in 2 years for surveillance.  Today she states   Past Medical  History:  Diagnosis Date  . Adenocarcinoma of colon ([place]) 09/13/2015  . Anemia   . Anxiety   . Asthma   . Cancer ([place])    endometrial; cancer cells in intestine  . COPD (chronic obstructive pulmonary disease) ([place])    no definite diagnosis  . Depression   . Diverticulitis   . Dyspareunia 05/16/2015  . Family history of cancer   . Family history of kidney cancer   . GERD (gastroesophageal reflux disease)   . Indigestion   . Lynch syndrome   . Neuropathy    feet and hands  . Uterine fibroid   . Vaginal dryness 05/16/2015  . Vaginal itching 07/16/2015  . Vaginal Pap smear, abnormal     Past Surgical History:  Procedure Laterality Date  . ABDOMINAL HYSTERECTOMY    . APPENDECTOMY    . BIOPSY N/A 03/14/2015   Procedure: BIOPSY;  Surgeon: Daneil Dolin, MD;  Location: [facility];  Service: Endoscopy;  Laterality: N/A;  Gastric  . COLONOSCOPY N/A 01/28/2017   Procedure: COLONOSCOPY;  Surgeon: Daneil Dolin, MD;  Location: [facility];  Service: Endoscopy;  Laterality: N/A;  11:30am  . COLONOSCOPY WITH PROPOFOL N/A 03/14/2015   RMR: Internal hemorrhoids. colonic diverticulosis. Incomplete examination. Prepartation inadequate.  . COLONOSCOPY WITH PROPOFOL N/A 07/04/2015   RMR: Colonic diverticulosis . Large polypoid lesion in the vicinity of the hepatic flexure status post saline-assisted piecmeal snare polypectomy  with ablation and tattooing as described. Sigmoid polyp removed as described above. sigmoid colon polyp hyperplastic, hepatic flexure polyp with TA with focal high grade dysplasia   . ESOPHAGEAL DILATION N/A 03/14/2015   Procedure: ESOPHAGEAL DILATION;  Surgeon: Daneil Dolin, MD;  Location: [facility];  Service: Endoscopy;  Laterality:  N/AVenia Minks 78  . ESOPHAGOGASTRODUODENOSCOPY N/A 05/19/2016   Procedure: ESOPHAGOGASTRODUODENOSCOPY (EGD);  Surgeon: Daneil Dolin, MD;  [facility];  Service: Endoscopy;  Laterality: N/A;  215  . ESOPHAGOGASTRODUODENOSCOPY N/A  01/28/2017   Procedure: ESOPHAGOGASTRODUODENOSCOPY (EGD);  Surgeon: Daneil Dolin, MD;  [facility];  Service: Endoscopy;  Laterality: N/A;  . ESOPHAGOGASTRODUODENOSCOPY (EGD) WITH PROPOFOL N/A 03/14/2015   RMR: Mild erosive reflux esophagitis status post passage o f a Maloney dilator. Abnormal gastric mucosa of uncertain significance as described above. status post biopsy, benign  . MALONEY DILATION N/A 05/19/2016   Procedure: [name] DILATION;  Surgeon: Daneil Dolin, MD;  [facility];  Service: Endoscopy;  Laterality: N/A;  . [name] DILATION N/A 01/28/2017   Procedure: [name] DILATION;  Surgeon: Daneil Dolin, MD;  [facility];  Service: Endoscopy;  Laterality: N/A;  . PARTIAL COLECTOMY  08/30/2015   polyp with adenocarcinoma  . POLYPECTOMY N/A 07/04/2015   Procedure: POLYPECTOMY;  Surgeon: Daneil Dolin, MD;  Location: [facility];  Service: Endoscopy;  Laterality: N/A;  . PORTACATH PLACEMENT Right 09/2014  . TUBAL LIGATION      Current Outpatient Prescriptions  Medication Sig Dispense Refill  . albuterol (PROVENTIL HFA;VENTOLIN HFA) 108 (90 Base) MCG/ACT inhaler Inhale 2 puffs into the lungs every 2 (two) hours as needed for wheezing or shortness of breath (cough). 1 Inhaler 3  . albuterol (PROVENTIL) (2.5 MG/3ML) 0.083% nebulizer solution Take 3 mLs (2.5 mg total) by nebulization every 6 (six) hours as needed for wheezing or shortness of breath. (Patient taking differently: Take 2.5 mg by nebulization daily as needed for wheezing or shortness of breath. ) 75 mL 12  . ALPRAZolam (XANAX) 0.25 MG tablet Take 0.125 mg by mouth daily.     . citalopram (CELEXA) 40 MG tablet Take 40 mg by mouth at bedtime.  2  . DEXILANT 60 MG capsule TAKE ONE CAPSULE BY MOUTH DAILY 90 capsule 3  . linaclotide (LINZESS) 145 MCG CAPS capsule Take 1 capsule (145 mcg total) by mouth daily before breakfast. 30 capsule 5  . loratadine (CLARITIN) 10 MG tablet Take 10 mg by mouth  daily.    . montelukast (SINGULAIR) 10 MG tablet TAKE 1 TABLET NIGHTLY FOR PERSISTENT ALLERGIC SYMPTOMS  0  . Naphazoline-Pheniramine (ALLERGY EYE OP) Place 1-2 drops into both eyes 2 (two) times daily as needed (ITCHY, WATERY EYES).    . ondansetron (ZOFRAN) 8 MG tablet Take 8 mg by mouth daily as needed (CAR SICKNESS).    Marland Kitchen oxymetazoline (AFRIN) 0.05 % nasal spray Place 1 spray into both nostrils daily as needed for congestion.    . sucralfate (CARAFATE) 1 GM/10ML suspension Take 10 mLs (1 g total) by mouth 4 (four) times daily. 420 mL 1  . traMADol (ULTRAM) 50 MG tablet TAKE 1 TABLET BY MOUTH EVERY 6 HOURS AS NEEDED 30 tablet 1  . traZODone (DESYREL) 50 MG tablet Take 50 mg by mouth at bedtime.     No current facility-administered medications for this visit.     Allergies as of 04/13/2017 - Review Complete 03/03/2017  Allergen Reaction Noted  . Codeine Rash 08/25/2011    Family History  Problem Relation Age of Onset  . Other Mother        clot that went to heart, deceased age 52ss  . Heart attack Father        age 53s, deceased  . Stroke Father   .  Cancer Father        "at death determined he was ate up with cancer"  . Hypertension Sister   . Kidney cancer Sister 40  . Diabetes Brother   . Hypertension Brother   . Endometriosis Daughter   . Heart attack Maternal Grandfather   . Diabetes Sister   . Brain cancer Paternal Aunt   . Cancer Paternal Uncle        NOS  . Cancer Paternal Uncle        NOS  . Cancer Paternal Uncle        NOS  . Colon cancer Neg Hx     Social History   Social History  . Marital status: Married    Spouse name: N/A  . Number of children: 2  . Years of education: N/A   Social History Main Topics  . Smoking status: Current Every Day Smoker    Packs/day: 0.25    Years: 25.00    Types: Cigarettes  . Smokeless tobacco: Never Used     Comment: Is trying to quit/wean off.  . Alcohol use No  . Drug use: No  . Sexual activity: Not Currently     Birth control/ protection: Surgical     Comment: hyst   Other Topics Concern  . Not on file   Social History Narrative  . No narrative on file    Review of Systems: General: Negative for anorexia, weight loss, fever, chills, fatigue, weakness. Eyes: Negative for vision changes.  ENT: Negative for hoarseness, difficulty swallowing , nasal congestion. CV: Negative for chest pain, angina, palpitations, dyspnea on exertion, peripheral edema.  Respiratory: Negative for dyspnea at rest, dyspnea on exertion, cough, sputum, wheezing.  GI: See history of present illness. GU:  Negative for dysuria, hematuria, urinary incontinence, urinary frequency, nocturnal urination.  MS: Negative for joint pain, low back pain.  Derm: Negative for rash or itching.  Neuro: Negative for weakness, abnormal sensation, seizure, frequent headaches, memory loss, confusion.  Psych: Negative for anxiety, depression, suicidal ideation, hallucinations.  Endo: Negative for unusual weight change.  Heme: Negative for bruising or bleeding. Allergy: Negative for rash or hives.   Physical Exam: There were no vitals taken for this visit. General:   Alert and oriented. Pleasant and cooperative. Well-nourished and well-developed.  Head:  Normocephalic and atraumatic. Eyes:  Without icterus, sclera clear and conjunctiva pink.  Ears:  Normal auditory acuity. Mouth:  No deformity or lesions, oral mucosa pink.  Throat/Neck:  Supple, without mass or thyromegaly. Cardiovascular:  S1, S2 present without murmurs appreciated. Normal pulses noted. Extremities without clubbing or edema. Respiratory:  Clear to auscultation bilaterally. No wheezes, rales, or rhonchi. No distress.  Gastrointestinal:  +BS, soft, non-tender and non-distended. No HSM noted. No guarding or rebound. No masses appreciated.  Rectal:  Deferred  Musculoskalatal:  Symmetrical without gross deformities. Normal posture. Skin:  Intact without significant lesions  or rashes. Neurologic:  Alert and oriented x4;  grossly normal neurologically. Psych:  Alert and cooperative. Normal mood and affect. Heme/Lymph/Immune: No significant cervical adenopathy. No excessive bruising noted.    04/13/2017 9:56 AM   Disclaimer: This note was dictated with voice recognition software. Similar sounding words can inadvertently be transcribed and may not be corrected upon review.

## 2017-04-14 ENCOUNTER — Other Ambulatory Visit (HOSPITAL_COMMUNITY): Payer: Self-pay | Admitting: Oncology

## 2017-04-14 DIAGNOSIS — C189 Malignant neoplasm of colon, unspecified: Secondary | ICD-10-CM

## 2017-04-14 DIAGNOSIS — C541 Malignant neoplasm of endometrium: Secondary | ICD-10-CM

## 2017-06-03 ENCOUNTER — Encounter (HOSPITAL_COMMUNITY): Payer: Self-pay

## 2017-06-03 ENCOUNTER — Other Ambulatory Visit (HOSPITAL_COMMUNITY): Payer: Self-pay

## 2017-06-29 ENCOUNTER — Ambulatory Visit (HOSPITAL_COMMUNITY): Payer: Self-pay

## 2017-06-29 ENCOUNTER — Other Ambulatory Visit (HOSPITAL_COMMUNITY): Payer: Self-pay

## 2017-07-07 ENCOUNTER — Other Ambulatory Visit (HOSPITAL_COMMUNITY): Payer: Self-pay | Admitting: Oncology

## 2017-07-07 ENCOUNTER — Other Ambulatory Visit (HOSPITAL_COMMUNITY): Payer: Self-pay | Admitting: Adult Health

## 2017-07-07 ENCOUNTER — Encounter (HOSPITAL_COMMUNITY): Payer: Self-pay | Admitting: Adult Health

## 2017-07-07 DIAGNOSIS — C189 Malignant neoplasm of colon, unspecified: Secondary | ICD-10-CM

## 2017-07-07 DIAGNOSIS — R202 Paresthesia of skin: Secondary | ICD-10-CM

## 2017-07-07 DIAGNOSIS — T451X5A Adverse effect of antineoplastic and immunosuppressive drugs, initial encounter: Principal | ICD-10-CM

## 2017-07-07 DIAGNOSIS — C541 Malignant neoplasm of endometrium: Secondary | ICD-10-CM

## 2017-07-07 DIAGNOSIS — G620 Drug-induced polyneuropathy: Secondary | ICD-10-CM

## 2017-07-07 MED ORDER — PREGABALIN 75 MG PO CAPS: 75.0000 mg | ORAL_CAPSULE | Freq: Every day | ORAL | 2 refills | Status: DC

## 2017-07-07 NOTE — Progress Notes (Signed)
Received pharmacy refill request for Lyrica.   I had nursing call patient to verify how she is taking the medication because the refill request was not clear.  Per nursing conversation with patient, she is taking Lyrica 75 mg once daily at bedtime "because I feel too drugged up if I take more than that."   Therefore, I altered Rx to reflect once daily dosing.    [place] Controlled Substance Reporting System reviewed and refill is appropriate on or after 07/07/17. Paper prescription printed & post-dated; Rx left at cancer center front desk for patient to retrieve after showing photo ID per clinic policy.   NCCSRS reviewed:     Mike Craze, NP [place] [phone]

## 2017-07-14 ENCOUNTER — Encounter (HOSPITAL_COMMUNITY): Payer: Medicare Other | Attending: Adult Health

## 2017-07-14 ENCOUNTER — Encounter (HOSPITAL_BASED_OUTPATIENT_CLINIC_OR_DEPARTMENT_OTHER): Payer: Medicare Other | Admitting: Oncology

## 2017-07-14 ENCOUNTER — Encounter (HOSPITAL_COMMUNITY): Payer: Self-pay

## 2017-07-14 VITALS — BP 109/68 | HR 68 | Temp 99.0°F | Resp 16 | Wt 171.2 lb

## 2017-07-14 DIAGNOSIS — M25551 Pain in right hip: Secondary | ICD-10-CM | POA: Diagnosis not present

## 2017-07-14 DIAGNOSIS — F329 Major depressive disorder, single episode, unspecified: Secondary | ICD-10-CM | POA: Insufficient documentation

## 2017-07-14 DIAGNOSIS — Z9889 Other specified postprocedural states: Secondary | ICD-10-CM | POA: Insufficient documentation

## 2017-07-14 DIAGNOSIS — F17210 Nicotine dependence, cigarettes, uncomplicated: Secondary | ICD-10-CM | POA: Insufficient documentation

## 2017-07-14 DIAGNOSIS — C541 Malignant neoplasm of endometrium: Secondary | ICD-10-CM

## 2017-07-14 DIAGNOSIS — Z808 Family history of malignant neoplasm of other organs or systems: Secondary | ICD-10-CM | POA: Diagnosis not present

## 2017-07-14 DIAGNOSIS — K219 Gastro-esophageal reflux disease without esophagitis: Secondary | ICD-10-CM | POA: Insufficient documentation

## 2017-07-14 DIAGNOSIS — Z8542 Personal history of malignant neoplasm of other parts of uterus: Secondary | ICD-10-CM | POA: Insufficient documentation

## 2017-07-14 DIAGNOSIS — Z23 Encounter for immunization: Secondary | ICD-10-CM | POA: Diagnosis not present

## 2017-07-14 DIAGNOSIS — Z86010 Personal history of colonic polyps: Secondary | ICD-10-CM | POA: Diagnosis not present

## 2017-07-14 DIAGNOSIS — R5382 Chronic fatigue, unspecified: Secondary | ICD-10-CM

## 2017-07-14 DIAGNOSIS — J449 Chronic obstructive pulmonary disease, unspecified: Secondary | ICD-10-CM | POA: Insufficient documentation

## 2017-07-14 DIAGNOSIS — Z1509 Genetic susceptibility to other malignant neoplasm: Secondary | ICD-10-CM

## 2017-07-14 DIAGNOSIS — C189 Malignant neoplasm of colon, unspecified: Secondary | ICD-10-CM

## 2017-07-14 DIAGNOSIS — Z79899 Other long term (current) drug therapy: Secondary | ICD-10-CM | POA: Diagnosis not present

## 2017-07-14 DIAGNOSIS — G8929 Other chronic pain: Secondary | ICD-10-CM | POA: Diagnosis not present

## 2017-07-14 DIAGNOSIS — G629 Polyneuropathy, unspecified: Secondary | ICD-10-CM | POA: Insufficient documentation

## 2017-07-14 DIAGNOSIS — F419 Anxiety disorder, unspecified: Secondary | ICD-10-CM | POA: Diagnosis not present

## 2017-07-14 DIAGNOSIS — Z90710 Acquired absence of both cervix and uterus: Secondary | ICD-10-CM | POA: Diagnosis not present

## 2017-07-14 DIAGNOSIS — R32 Unspecified urinary incontinence: Secondary | ICD-10-CM | POA: Diagnosis not present

## 2017-07-14 DIAGNOSIS — Z885 Allergy status to narcotic agent status: Secondary | ICD-10-CM | POA: Insufficient documentation

## 2017-07-14 DIAGNOSIS — C785 Secondary malignant neoplasm of large intestine and rectum: Secondary | ICD-10-CM | POA: Insufficient documentation

## 2017-07-14 DIAGNOSIS — Z9221 Personal history of antineoplastic chemotherapy: Secondary | ICD-10-CM | POA: Diagnosis not present

## 2017-07-14 DIAGNOSIS — Z8249 Family history of ischemic heart disease and other diseases of the circulatory system: Secondary | ICD-10-CM | POA: Insufficient documentation

## 2017-07-14 DIAGNOSIS — Z15068 Genetic susceptibility to other malignant neoplasm of digestive system: Secondary | ICD-10-CM

## 2017-07-14 DIAGNOSIS — Z833 Family history of diabetes mellitus: Secondary | ICD-10-CM | POA: Insufficient documentation

## 2017-07-14 DIAGNOSIS — Z720 Tobacco use: Secondary | ICD-10-CM

## 2017-07-14 DIAGNOSIS — Z823 Family history of stroke: Secondary | ICD-10-CM | POA: Insufficient documentation

## 2017-07-14 LAB — COMPREHENSIVE METABOLIC PANEL
ALBUMIN: 4.1 g/dL (ref 3.5–5.0)
ALT: 23 U/L (ref 14–54)
AST: 18 U/L (ref 15–41)
Alkaline Phosphatase: 66 U/L (ref 38–126)
Anion gap: 8 (ref 5–15)
BUN: 10 mg/dL (ref 6–20)
CHLORIDE: 103 mmol/L (ref 101–111)
CO2: 28 mmol/L (ref 22–32)
Calcium: 8.9 mg/dL (ref 8.9–10.3)
Creatinine, Ser: 0.86 mg/dL (ref 0.44–1.00)
GFR calc Af Amer: >60 mL/min (ref >60)
GFR calc non Af Amer: >60 mL/min (ref >60)
GLUCOSE: 140 mg/dL — AB (ref 65–99)
POTASSIUM: 3.8 mmol/L (ref 3.5–5.1)
Sodium: 139 mmol/L (ref 135–145)
Total Bilirubin: 0.3 mg/dL (ref 0.3–1.2)
Total Protein: 7.3 g/dL (ref 6.5–8.1)

## 2017-07-14 LAB — CBC WITH DIFFERENTIAL/PLATELET
BASOS ABS: 0 10*3/uL (ref 0.0–0.1)
Basophils Relative: 0 %
Eosinophils Absolute: 0.1 10*3/uL (ref 0.0–0.7)
Eosinophils Relative: 1 %
HCT: 38.3 % (ref 36.0–46.0)
Hemoglobin: 13 g/dL (ref 12.0–15.0)
LYMPHS PCT: 31 %
Lymphs Abs: 3.1 10*3/uL (ref 0.7–4.0)
MCH: 30.7 pg (ref 26.0–34.0)
MCHC: 33.9 g/dL (ref 30.0–36.0)
MCV: 90.3 fL (ref 78.0–100.0)
Monocytes Absolute: 0.7 10*3/uL (ref 0.1–1.0)
Monocytes Relative: 7 %
NEUTROS ABS: 6.1 10*3/uL (ref 1.7–7.7)
Neutrophils Relative %: 61 %
PLATELETS: 245 10*3/uL (ref 150–400)
RBC: 4.24 MIL/uL (ref 3.87–5.11)
RDW: 13.7 % (ref 11.5–15.5)
WBC: 10.1 10*3/uL (ref 4.0–10.5)

## 2017-07-14 MED ORDER — INFLUENZA VAC SPLIT QUAD 0.5 ML IM SUSY
PREFILLED_SYRINGE | INTRAMUSCULAR | Status: AC
  Filled 2017-07-14: qty 0.5

## 2017-07-14 MED ORDER — INFLUENZA VAC SPLIT QUAD 0.5 ML IM SUSY
0.5000 mL | PREFILLED_SYRINGE | Freq: Once | INTRAMUSCULAR | Status: AC
  Administered 2017-07-14: 0.5 mL via INTRAMUSCULAR

## 2017-07-14 NOTE — Patient Instructions (Addendum)
[facility] Discharge Instructions  RECOMMENDATIONS MADE BY THE CONSULTANT AND ANY TEST RESULTS WILL BE SENT TO YOUR REFERRING PHYSICIAN.  You were seen today by Dr. Twana First You got your flu shot today Follow up in 6 months with labs   Thank you for choosing [facility] to provide your oncology and hematology care.  To afford each patient quality time with our provider, please arrive at least 15 minutes before your scheduled appointment time.    If you have a lab appointment with the [facility] please come in thru the  Main Entrance and check in at the main information desk  You need to re-schedule your appointment should you arrive 10 or more minutes late.  We strive to give you quality time with our providers, and arriving late affects you and other patients whose appointments are after yours.  Also, if you no show three or more times for appointments you may be dismissed from the clinic at the providers discretion.     Again, thank you for choosing [facility].  Our hope is that these requests will decrease the amount of time that you wait before being seen by our physicians.       _____________________________________________________________  Should you have questions after your visit to [facility], please contact our office at [phone] between the hours of 8:30 a.m. and 4:30 p.m.  Voicemails left after 4:30 p.m. will not be returned until the following business day.  For prescription refill requests, have your pharmacy contact our office.       Resources For Cancer Patients and their Caregivers ? American Cancer Society: Can assist with transportation, wigs, general needs, runs Look Good Feel Better.        579-139-7412 ? Cancer Care: Provides financial assistance, online support groups, medication/co-pay assistance.  1-800-813-HOPE 5410467125) ? [place] Assists [place] cancer patients and their families through emotional , educational and financial support.  763-038-1062 ? [place] DSS Where to apply for food stamps, Medicaid and utility assistance. 502-646-4425 ? [facility]: Transportation to medical appointments. 316-284-8600 ? Social Security Administration: May apply for disability if have a Stage IV cancer. 337-263-9541 308 769 4246 ? [employer], Disability and Transit Services: Assists with nutrition, care and transit needs. [place] Support Programs: @10RELATIVEDAYS @ > Cancer Support Group  2nd Tuesday of the month 1pm-2pm, Journey Room  > Creative Journey  3rd Tuesday of the month 1130am-1pm, Journey Room  > Look Good Feel Better  1st Wednesday of the month 10am-12 noon, Journey Room (Call [place] to register [phone])

## 2017-07-14 NOTE — Progress Notes (Signed)
[place] [place], [place] [address]   CLINIC:  Medical Oncology/Hematology  PCP:  System, Pcp Not In No address on file None   REASON FOR VISIT:  Follow-up for Stage IV endometrial cancer with probable pulmonary metastases AND adenocarcinoma of colon  CURRENT THERAPY: Observation    BRIEF ONCOLOGIC HISTORY:    Endometrial cancer (Julia Osborne)   07/12/2014 Imaging    CT C/A/P, pelvic adenopathy, multiple pulmonary nodules concerning for metastatic disease      07/30/2014 Initial Diagnosis    Endometrial cancer      07/31/2014 Pathology Results    endometrioid adenocarcinoma, G2, lymphovascular invasion present, positive pelvic lymph node and vaginal biopsy      07/31/2014 Definitive Surgery    radical hysterectomy, BSO, pelvic lymph node dissection and vaginal biopsies      09/25/2014 Procedure    Port-A-Cath placement in IR      10/18/2014 - 01/10/2015 Chemotherapy    Carboplatin/Taxol. First cycle given at [facility].  S/P 6 cycles total, 5 cycles given at [facility].      07/04/2015 Procedure    Colonoscopy by Dr. Gala Romney      07/04/2015 Pathology Results    Colon, polyp(s), vicinity of hepatic flexure - TUBULAR ADENOMA WITH FOCAL HIGH GRADE DYSPLASIA.       Procedure    Partial colectomy by Dr. Arnoldo Morale scheduled for 08/30/2015      10/31/2015 Genetic Testing    [facility] SYNDROME MSH6 Mutation      04/27/2016 Imaging    CT CAP- No acute process or evidence of metastatic disease in the chest. Right middle lobe pulmonary nodule is unchanged back to 11/20/2014, favoring a benign etiology      11/10/2016 Imaging    CT CAP- 1. No evidence of metastatic disease in the chest, abdomen or pelvis. 2. No evidence of local tumor recurrence at the ileocolic anastomosis in the right abdomen or in the pelvis. Decreased mild fat stranding at the base of the right mesentery, most consistent with postsurgical scarring. 3. Aortic atherosclerosis.         Adenocarcinoma of colon ([place])   07/04/2015 Pathology Results    Diagnosis 1. Colon, polyp(s), vicinity of hepatic flexure - TUBULAR ADENOMA WITH FOCAL HIGH GRADE DYSPLASIA. - NO INVASIVE CARCINOMA. 2. Colon, polyp(s), sigmoid - HYPERPLASTIC POLYP. - NO DYSPLASIA OR MALIGNANCY.      07/04/2015 Procedure    Colonoscopoy by Dr. Gala Romney- large polypoid colonic lesion.      08/30/2015 Definitive Surgery    Dr. Arnoldo Morale- right segmental colon resection      08/30/2015 Pathology Results    [id number] intramucosal adenocarcinoma of colon, 0.2 cm inding the laminal propria with negative resection margins and 0/17 lymph nodes.        HISTORY OF PRESENT ILLNESS:  (From Dr. Donald Pore last note on 07/27/16)      INTERVAL HISTORY:  Julia Osborne for follow-up for her history of Stage IV endometrial cancer and adenocarcinoma of the colon; she has Lynch Syndrome.    Patient presents today for continued follow-up.  She states that she has been doing well since her last visit.  She does have some chronic fatigue.  She has chronic pain in her right hip which is 5 out of 10 in severity.  Appetite is same and weight is stable.  She denies any chest pain, shortness breath, abdominal pain, abdominal bloating.  She does state that she urinates frequently and is incontinent at night  or with stress.  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  She has not been taking her Lyrica as prescribed because she ran out her prescription but recently got a refill.  Colonoscopy performed on 01/28/2017 demonstrated diverticulosis in the sigmoid colon and in the descending colon, status post right hemicolectomy, the exam was otherwise normal.   REVIEW OF SYSTEMS:  Review of Systems  Constitutional: Negative for chills and fever.  HENT:  Negative.  Negative for nosebleeds.   Eyes: Negative.   Respiratory: Negative for cough ( ).   Cardiovascular: Negative.  Negative for chest pain and  palpitations.  Gastrointestinal: Negative for blood in stool, nausea and vomiting.  Endocrine: Negative.   Genitourinary: Positive for bladder incontinence (periodic stress/urge urinary incontinence ). Negative for dysuria and hematuria.   Musculoskeletal: Negative for arthralgias.  Skin: Negative.  Negative for rash.  Neurological: Positive for numbness (peripheral neuropathy ). Negative for dizziness.  Hematological: Negative.   Psychiatric/Behavioral: Nervous/anxious: takes Xanax as needed       PAST MEDICAL/SURGICAL HISTORY:  Past Medical History:  Diagnosis Date  . Adenocarcinoma of colon ([place]) 09/13/2015  . Anemia   . Anxiety   . Asthma   . Cancer ([place])    endometrial; cancer cells in intestine  . COPD (chronic obstructive pulmonary disease) ([place])    no definite diagnosis  . Depression   . Diverticulitis   . Dyspareunia 05/16/2015  . Family history of cancer   . Family history of kidney cancer   . GERD (gastroesophageal reflux disease)   . Indigestion   . Lynch syndrome   . Neuropathy    feet and hands  . Uterine fibroid   . Vaginal dryness 05/16/2015  . Vaginal itching 07/16/2015  . Vaginal Pap smear, abnormal    Past Surgical History:  Procedure Laterality Date  . ABDOMINAL HYSTERECTOMY    . APPENDECTOMY    . BIOPSY N/A 03/14/2015   Procedure: BIOPSY;  Surgeon: Daneil Dolin, MD;  Location: [facility];  Service: Endoscopy;  Laterality: N/A;  Gastric  . COLONOSCOPY N/A 01/28/2017   Procedure: COLONOSCOPY;  Surgeon: Daneil Dolin, MD;  Location: [facility];  Service: Endoscopy;  Laterality: N/A;  11:30am  . COLONOSCOPY WITH PROPOFOL N/A 03/14/2015   RMR: Internal hemorrhoids. colonic diverticulosis. Incomplete examination. Prepartation inadequate.  . COLONOSCOPY WITH PROPOFOL N/A 07/04/2015   RMR: Colonic diverticulosis . Large polypoid lesion in the vicinity of the hepatic flexure status post saline-assisted piecmeal snare polypectomy  with ablation and tattooing  as described. Sigmoid polyp removed as described above. sigmoid colon polyp hyperplastic, hepatic flexure polyp with TA with focal high grade dysplasia   . ESOPHAGEAL DILATION N/A 03/14/2015   Procedure: ESOPHAGEAL DILATION;  Surgeon: Daneil Dolin, MD;  Location: [facility];  Service: Endoscopy;  Laterality: N/A;  Maloney 43  . ESOPHAGOGASTRODUODENOSCOPY N/A 05/19/2016   Procedure: ESOPHAGOGASTRODUODENOSCOPY (EGD);  Surgeon: Daneil Dolin, MD;  [facility];  Service: Endoscopy;  Laterality: N/A;  215  . ESOPHAGOGASTRODUODENOSCOPY N/A 01/28/2017   Procedure: ESOPHAGOGASTRODUODENOSCOPY (EGD);  Surgeon: Daneil Dolin, MD;  [facility];  Service: Endoscopy;  Laterality: N/A;  . ESOPHAGOGASTRODUODENOSCOPY (EGD) WITH PROPOFOL N/A 03/14/2015   RMR: Mild erosive reflux esophagitis status post passage o f a Maloney dilator. Abnormal gastric mucosa of uncertain significance as described above. status post biopsy, benign  . MALONEY DILATION N/A 05/19/2016   Procedure: [name] DILATION;  Surgeon: Daneil Dolin,  MD;  [facility];  Service: Endoscopy;  Laterality: N/A;  . [name] DILATION N/A 01/28/2017   Procedure: [name] DILATION;  Surgeon: Daneil Dolin, MD;  [facility];  Service: Endoscopy;  Laterality: N/A;  . PARTIAL COLECTOMY  08/30/2015   polyp with adenocarcinoma  . POLYPECTOMY N/A 07/04/2015   Procedure: POLYPECTOMY;  Surgeon: Daneil Dolin, MD;  Location: [facility];  Service: Endoscopy;  Laterality: N/A;  . PORTACATH PLACEMENT Right 09/2014  . TUBAL LIGATION       SOCIAL HISTORY:  Social History   Social History  . Marital status: Married    Spouse name: N/A  . Number of children: 2  . Years of education: N/A   Occupational History  . Not on file.   Social History Main Topics  . Smoking status: Current Every Day Smoker    Packs/day: 0.25    Years: 25.00    Types: Cigarettes  . Smokeless tobacco: Never Used     Comment: Is trying to  quit/wean off.  . Alcohol use No  . Drug use: No  . Sexual activity: Not Currently    Birth control/ protection: Surgical     Comment: hyst   Other Topics Concern  . Not on file   Social History Narrative  . No narrative on file    FAMILY HISTORY:  Family History  Problem Relation Age of Onset  . Other Mother        clot that went to heart, deceased age 31ss  . Heart attack Father        age 20s, deceased  . Stroke Father   . Cancer Father        "at death determined he was ate up with cancer"  . Hypertension Sister   . Kidney cancer Sister 36  . Diabetes Brother   . Hypertension Brother   . Endometriosis Daughter   . Heart attack Maternal Grandfather   . Diabetes Sister   . Brain cancer Paternal Aunt   . Cancer Paternal Uncle        NOS  . Cancer Paternal Uncle        NOS  . Cancer Paternal Uncle        NOS  . Colon cancer Neg Hx     CURRENT MEDICATIONS:  Outpatient Encounter Prescriptions as of 07/14/2017  Medication Sig  . albuterol (PROVENTIL HFA;VENTOLIN HFA) 108 (90 Base) MCG/ACT inhaler Inhale 2 puffs into the lungs every 2 (two) hours as needed for wheezing or shortness of breath (cough).  Marland Kitchen albuterol (PROVENTIL) (2.5 MG/3ML) 0.083% nebulizer solution Take 3 mLs (2.5 mg total) by nebulization every 6 (six) hours as needed for wheezing or shortness of breath. (Patient taking differently: Take 2.5 mg by nebulization daily as needed for wheezing or shortness of breath. )  . ALPRAZolam (XANAX) 0.25 MG tablet Take 0.125 mg by mouth daily.   . citalopram (CELEXA) 40 MG tablet Take 40 mg by mouth at bedtime.  Marland Kitchen DEXILANT 60 MG capsule TAKE ONE CAPSULE BY MOUTH DAILY  . linaclotide (LINZESS) 145 MCG CAPS capsule Take 1 capsule (145 mcg total) by mouth daily before breakfast.  . loratadine (CLARITIN) 10 MG tablet Take 10 mg by mouth daily.  . montelukast (SINGULAIR) 10 MG tablet TAKE 1 TABLET NIGHTLY FOR PERSISTENT ALLERGIC SYMPTOMS  . Naphazoline-Pheniramine  (ALLERGY EYE OP) Place 1-2 drops into both eyes 2 (two) times daily as needed (ITCHY, WATERY EYES).  . ondansetron (ZOFRAN) 8 MG  tablet Take 8 mg by mouth daily as needed (CAR SICKNESS).  Marland Kitchen oxymetazoline (AFRIN) 0.05 % nasal spray Place 1 spray into both nostrils daily as needed for congestion.  . pregabalin (LYRICA) 75 MG capsule Take 1 capsule (75 mg total) by mouth at bedtime.  . sucralfate (CARAFATE) 1 GM/10ML suspension Take 10 mLs (1 g total) by mouth 4 (four) times daily.  . traMADol (ULTRAM) 50 MG tablet TAKE 1 TABLET BY MOUTH EVERY 6 HOURS AS NEEDED  . traZODone (DESYREL) 50 MG tablet Take 50 mg by mouth at bedtime.  . [EXPIRED] Influenza vac split quadrivalent PF (FLUARIX) injection 0.5 mL    No facility-administered encounter medications on file as of 07/14/2017.     ALLERGIES:  Allergies  Allergen Reactions  . Codeine Rash     PHYSICAL EXAM:  ECOG Performance status: 1 - Symptomatic, but independent.   Vitals:   07/14/17 1509  BP: 109/68  Pulse: 68  Resp: 16  Temp: 99 F (37.2 C)  SpO2: 100%   Filed Weights   07/14/17 1509  Weight: 171 lb 3.2 oz (77.7 kg)    Physical Exam  Constitutional: She is oriented to person, place, and time and well-developed, well-nourished, and in no distress.  HENT:  Head: Normocephalic.  Right Ear: External ear and ear canal normal.  Left Ear: External ear and ear canal normal.  Eyes: Pupils are equal, round, and reactive to light. No scleral icterus.  Neck: Normal range of motion. Neck supple.  Cardiovascular: Normal rate, regular rhythm and normal heart sounds.   Pulmonary/Chest: Effort normal and breath sounds normal. No respiratory distress.  Abdominal: Soft. Bowel sounds are normal. There is no tenderness.  Musculoskeletal: Normal range of motion. She exhibits no edema.  Lymphadenopathy:    She has no cervical adenopathy.  Neurological: She is alert and oriented to person, place, and time. No cranial nerve deficit.  Skin:  Skin is warm and dry. No rash noted.  Psychiatric: Mood, memory, affect and judgment normal.     LABORATORY DATA:  I have reviewed the labs as listed.  CBC    Component Value Date/Time   WBC 10.1 07/14/2017 1435   RBC 4.24 07/14/2017 1435   HGB 13.0 07/14/2017 1435   HCT 38.3 07/14/2017 1435   PLT 245 07/14/2017 1435   MCV 90.3 07/14/2017 1435   MCH 30.7 07/14/2017 1435   MCHC 33.9 07/14/2017 1435   RDW 13.7 07/14/2017 1435   LYMPHSABS 3.1 07/14/2017 1435   MONOABS 0.7 07/14/2017 1435   EOSABS 0.1 07/14/2017 1435   BASOSABS 0.0 07/14/2017 1435   CMP Latest Ref Rng & Units 07/14/2017 03/03/2017 10/23/2016  Glucose 65 - 99 mg/dL 140(H) 121(H) 111(H)  BUN 6 - 20 mg/dL '10 11 11  ' Creatinine 0.44 - 1.00 mg/dL 0.86 0.80 0.96  Sodium 135 - 145 mmol/L 139 138 138  Potassium 3.5 - 5.1 mmol/L 3.8 3.4(L) 3.6  Chloride 101 - 111 mmol/L 103 102 101  CO2 22 - 32 mmol/L '28 26 28  ' Calcium 8.9 - 10.3 mg/dL 8.9 9.1 9.4  Total Protein 6.5 - 8.1 g/dL 7.3 7.3 7.7  Total Bilirubin 0.3 - 1.2 mg/dL 0.3 0.4 0.6  Alkaline Phos 38 - 126 U/L 66 61 62  AST 15 - 41 U/L '18 20 17  ' ALT 14 - 54 U/L '23 21 18   ' Results for JERICKA, KADAR (MRN [id number])   Ref. Range 08/26/2015 09:30  CEA Latest Ref Range: 0.0 - 4.7 ng/mL  2.1   Results for DONNEISHA, BEANE (MRN [id number])   Ref. Range 10/18/2015 11:45 01/16/2016 09:47 04/09/2016 11:07 07/27/2016 10:26 10/23/2016 09:52  CA 125 Latest Ref Range: 0.0 - 38.1 U/mL 17.3 19.3 13.1 16.2 14.9   Results for SHANIKWA, STATE (MRN [id number]) as of 07/14/2017 15:24  Ref. Range 03/03/2017 14:41  CA 125 Latest Ref Range: 0.0 - 38.1 U/mL 13.6  CEA Latest Ref Range: 0.0 - 4.7 ng/mL 3.3    PENDING LABS:    DIAGNOSTIC IMAGING:  CT chest/abd/pelvis: 11/10/16     PATHOLOGY:  TAH/BSO surgical path [facility]): 08/04/14      Surgical path colon: 08/30/15      ASSESSMENT & PLAN:   Stage IV endometrial cancer (diagnosed in 07/2014): -s/p TAH/BSO on 07/31/14, followed  by adjuvant chemotherapy with Carbo/Taxol x 5 cycles, completed on 01/09/15. -CA-125 remains continue to monitor.     Adenocarcinoma of colon/Lynch syndrome:  -s/p partial colectomy in 08/2015; CEA not elevated at time of diagnosis (CEA 2.1 on 08/26/15).  -Last colonoscopy in May 2018 was normal. -Will repeat CEA periodically for assessment.       Dispo:  -Patient is now 3 years out. Return to cancer center in 6 months with labs.   All questions were answered to patient's stated satisfaction. Encouraged patient to call with any new concerns or questions before her next visit to the cancer center and we can certain see her sooner, if needed.     Orders placed this encounter:  Orders Placed This Encounter  Procedures  . CBC with Differential  . Comprehensive metabolic panel  . CEA  . CA 125    Twana First, MD

## 2017-07-14 NOTE — Progress Notes (Signed)
Julia Osborne presents today for injection per MD orders. Fluarix administered IM in left deltoid. Administration without incident. Patient tolerated well. Patient tolerated treatment without incidence. Patient discharged ambulatory and in stable condition from clinic. Patient to follow up as scheduled.

## 2017-07-15 LAB — CEA: CEA: 3.4 ng/mL (ref 0.0–4.7)

## 2017-07-15 LAB — CA 125: Cancer Antigen (CA) 125: 16.6 U/mL (ref 0.0–38.1)

## 2017-07-21 ENCOUNTER — Other Ambulatory Visit (HOSPITAL_COMMUNITY): Payer: Self-pay | Admitting: Oncology

## 2017-07-21 DIAGNOSIS — C541 Malignant neoplasm of endometrium: Secondary | ICD-10-CM

## 2017-07-21 DIAGNOSIS — C189 Malignant neoplasm of colon, unspecified: Secondary | ICD-10-CM

## 2017-07-22 ENCOUNTER — Encounter (HOSPITAL_COMMUNITY): Payer: Self-pay | Admitting: Adult Health

## 2017-07-22 ENCOUNTER — Other Ambulatory Visit (HOSPITAL_COMMUNITY): Payer: Self-pay | Admitting: Adult Health

## 2017-07-22 ENCOUNTER — Other Ambulatory Visit (HOSPITAL_COMMUNITY): Payer: Self-pay | Admitting: Oncology

## 2017-07-22 DIAGNOSIS — C189 Malignant neoplasm of colon, unspecified: Secondary | ICD-10-CM

## 2017-07-22 DIAGNOSIS — C541 Malignant neoplasm of endometrium: Secondary | ICD-10-CM

## 2017-07-22 MED ORDER — TRAMADOL HCL 50 MG PO TABS: 50.0000 mg | ORAL_TABLET | Freq: Four times a day (QID) | ORAL | 1 refills | Status: DC | PRN

## 2017-07-22 NOTE — Progress Notes (Signed)
Received refill request from patient's pharmacy for Tramadol.    Controlled Substance Reporting System reviewed and refill is appropriate on or after 07/22/17. Paper prescription printed & post-dated; Will ask nursing to fax Rx to her pharmacy  [place] reviewed:     Mike Craze, NP [place] [phone]

## 2017-09-04 ENCOUNTER — Emergency Department: Payer: Medicare Other

## 2017-09-04 ENCOUNTER — Encounter: Payer: Self-pay | Admitting: Emergency Medicine

## 2017-09-04 ENCOUNTER — Emergency Department
Admission: EM | Admit: 2017-09-04 | Discharge: 2017-09-04 | Disposition: A | Discharge status: Home / Self Care | Payer: Medicare Other | Attending: Emergency Medicine | Admitting: Emergency Medicine

## 2017-09-04 DIAGNOSIS — K5900 Constipation, unspecified: Secondary | ICD-10-CM | POA: Diagnosis not present

## 2017-09-04 DIAGNOSIS — N3289 Other specified disorders of bladder: Secondary | ICD-10-CM | POA: Diagnosis not present

## 2017-09-04 DIAGNOSIS — R102 Pelvic and perineal pain: Secondary | ICD-10-CM | POA: Diagnosis present

## 2017-09-04 DIAGNOSIS — R300 Dysuria: Secondary | ICD-10-CM | POA: Diagnosis not present

## 2017-09-04 DIAGNOSIS — F17210 Nicotine dependence, cigarettes, uncomplicated: Secondary | ICD-10-CM | POA: Diagnosis not present

## 2017-09-04 DIAGNOSIS — R109 Unspecified abdominal pain: Secondary | ICD-10-CM | POA: Diagnosis not present

## 2017-09-04 DIAGNOSIS — J45909 Unspecified asthma, uncomplicated: Secondary | ICD-10-CM | POA: Insufficient documentation

## 2017-09-04 DIAGNOSIS — Z8542 Personal history of malignant neoplasm of other parts of uterus: Secondary | ICD-10-CM | POA: Diagnosis not present

## 2017-09-04 DIAGNOSIS — J449 Chronic obstructive pulmonary disease, unspecified: Secondary | ICD-10-CM | POA: Insufficient documentation

## 2017-09-04 DIAGNOSIS — Z85028 Personal history of other malignant neoplasm of stomach: Secondary | ICD-10-CM | POA: Diagnosis not present

## 2017-09-04 LAB — URINALYSIS, COMPLETE (UACMP) WITH MICROSCOPIC
BILIRUBIN URINE: NEGATIVE
Bacteria, UA: NONE SEEN
Glucose, UA: NEGATIVE mg/dL
KETONES UR: NEGATIVE mg/dL
LEUKOCYTES UA: NEGATIVE
Nitrite: NEGATIVE
PROTEIN: NEGATIVE mg/dL
Specific Gravity, Urine: 1.01 (ref 1.005–1.030)
pH: 7 (ref 5.0–8.0)

## 2017-09-04 LAB — COMPREHENSIVE METABOLIC PANEL
ALBUMIN: 4 g/dL (ref 3.5–5.0)
ALK PHOS: 59 U/L (ref 38–126)
ALT: 22 U/L (ref 14–54)
ANION GAP: 7 (ref 5–15)
AST: 22 U/L (ref 15–41)
BUN: 13 mg/dL (ref 6–20)
CALCIUM: 9.3 mg/dL (ref 8.9–10.3)
CO2: 26 mmol/L (ref 22–32)
Chloride: 103 mmol/L (ref 101–111)
Creatinine, Ser: 0.92 mg/dL (ref 0.44–1.00)
GFR calc Af Amer: >60 mL/min (ref >60)
GFR calc non Af Amer: >60 mL/min (ref >60)
GLUCOSE: 115 mg/dL — AB (ref 65–99)
POTASSIUM: 4.4 mmol/L (ref 3.5–5.1)
SODIUM: 136 mmol/L (ref 135–145)
Total Bilirubin: 0.5 mg/dL (ref 0.3–1.2)
Total Protein: 7.1 g/dL (ref 6.5–8.1)

## 2017-09-04 LAB — CBC WITH DIFFERENTIAL/PLATELET
BASOS PCT: 1 %
Basophils Absolute: 0.1 10*3/uL (ref 0–0.1)
EOS ABS: 0 10*3/uL (ref 0–0.7)
Eosinophils Relative: 1 %
HCT: 38.4 % (ref 35.0–47.0)
HEMOGLOBIN: 13.1 g/dL (ref 12.0–16.0)
Lymphocytes Relative: 37 %
Lymphs Abs: 2.4 10*3/uL (ref 1.0–3.6)
MCH: 30.4 pg (ref 26.0–34.0)
MCHC: 34.2 g/dL (ref 32.0–36.0)
MCV: 88.9 fL (ref 80.0–100.0)
MONOS PCT: 8 %
Monocytes Absolute: 0.5 10*3/uL (ref 0.2–0.9)
NEUTROS PCT: 53 %
Neutro Abs: 3.6 10*3/uL (ref 1.4–6.5)
Platelets: 208 10*3/uL (ref 150–440)
RBC: 4.32 MIL/uL (ref 3.80–5.20)
RDW: 13.5 % (ref 11.5–14.5)
WBC: 6.6 10*3/uL (ref 3.6–11.0)

## 2017-09-04 MED ORDER — DIAZEPAM 5 MG PO TABS
5.0000 mg | ORAL_TABLET | Freq: Once | ORAL | Status: AC
  Administered 2017-09-04: 5 mg via ORAL
  Filled 2017-09-04: qty 1

## 2017-09-04 MED ORDER — OXYCODONE-ACETAMINOPHEN 5-325 MG PO TABS
2.0000 | ORAL_TABLET | Freq: Once | ORAL | Status: AC
  Administered 2017-09-04: 2 via ORAL
  Filled 2017-09-04: qty 2

## 2017-09-04 MED ORDER — KETOROLAC TROMETHAMINE 60 MG/2ML IM SOLN
60.0000 mg | Freq: Once | INTRAMUSCULAR | Status: AC
  Administered 2017-09-04: 60 mg via INTRAMUSCULAR
  Filled 2017-09-04: qty 2

## 2017-09-04 MED ORDER — DIAZEPAM 5 MG PO TABS: 5.0000 mg | ORAL_TABLET | Freq: Three times a day (TID) | ORAL | 0 refills | Status: DC | PRN

## 2017-09-04 MED ORDER — PHENAZOPYRIDINE HCL 200 MG PO TABS
200.0000 mg | ORAL_TABLET | Freq: Once | ORAL | Status: AC
  Administered 2017-09-04: 200 mg via ORAL
  Filled 2017-09-04: qty 1

## 2017-09-04 MED ORDER — AMOXICILLIN 875 MG PO TABS: 875.0000 mg | ORAL_TABLET | Freq: Two times a day (BID) | ORAL | 0 refills | Status: DC

## 2017-09-04 MED ORDER — PHENAZOPYRIDINE HCL 200 MG PO TABS: 200.0000 mg | ORAL_TABLET | Freq: Three times a day (TID) | ORAL | 0 refills | Status: AC | PRN

## 2017-09-04 NOTE — ED Provider Notes (Signed)
[facility] Emergency Department Provider Note       Time seen: ----------------------------------------- 9:13 AM on 09/04/2017 -----------------------------------------   I have reviewed the triage vital signs and the nursing notes.  HISTORY   Chief Complaint Pelvic Pain and Dysuria    HPI Julia Osborne is a 56 y.o. female with a history of anemia, anxiety, asthma, dyspareunia who presents to the ED for pelvic pain and dysuria for the last week.  Patient has a history of endometrial cancer and intestinal cancer and states that she had surgery approximately 1 year ago and has had issues with her bladder since then.  She states she is having polyuria and feeling like she cannot empty her bladder.  She has had to be catheterized in the past.  Past Medical History:  Diagnosis Date  . Adenocarcinoma of colon ([place]) 09/13/2015  . Anemia   . Anxiety   . Asthma   . Cancer ([place])    endometrial; cancer cells in intestine  . COPD (chronic obstructive pulmonary disease) ([place])    no definite diagnosis  . Depression   . Diverticulitis   . Dyspareunia 05/16/2015  . Family history of cancer   . Family history of kidney cancer   . GERD (gastroesophageal reflux disease)   . Indigestion   . Lynch syndrome   . Neuropathy    feet and hands  . Uterine fibroid   . Vaginal dryness 05/16/2015  . Vaginal itching 07/16/2015  . Vaginal Pap smear, abnormal     Patient Active Problem List   Diagnosis Date Noted  . Abdominal pain 12/21/2016  . Lynch syndrome 10/31/2015  . Genetic testing 10/21/2015  . Abdominal pain, left upper quadrant 10/17/2015  . Family history of cancer   . Family history of kidney cancer   . Adenocarcinoma of colon ([place]) 09/13/2015  . Colon neoplasm 08/30/2015  . Vaginal itching 07/16/2015  . History of colonic polyps   . 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 drug ([place]) 06/28/2015  . Constipation 06/05/2015  . Abnormal CT scan, sigmoid colon  06/05/2015  . Vaginal dryness 05/16/2015  . Dyspareunia 05/16/2015  . Mucosal abnormality of stomach   . Reflux esophagitis   . Dysphagia   . Hematochezia   . Diverticulosis of colon without hemorrhage   . Rectal bleeding 02/14/2015  . GERD (gastroesophageal reflux disease) 02/14/2015  . Esophageal dysphagia 02/14/2015  . Abdominal pain, epigastric 02/14/2015  . Nausea with vomiting 02/14/2015  . Endometrial cancer ([place]) 10/09/2014  . Conversion reaction   . Depression   . Aphasia 09/11/2014    Past Surgical History:  Procedure Laterality Date  . ABDOMINAL HYSTERECTOMY    . APPENDECTOMY    . BIOPSY N/A 03/14/2015   Procedure: BIOPSY;  Surgeon: Daneil Dolin, MD;  Location: [facility];  Service: Endoscopy;  Laterality: N/A;  Gastric  . COLONOSCOPY N/A 01/28/2017   Procedure: COLONOSCOPY;  Surgeon: Daneil Dolin, MD;  Location: [facility];  Service: Endoscopy;  Laterality: N/A;  11:30am  . COLONOSCOPY WITH PROPOFOL N/A 03/14/2015   RMR: Internal hemorrhoids. colonic diverticulosis. Incomplete examination. Prepartation inadequate.  . COLONOSCOPY WITH PROPOFOL N/A 07/04/2015   RMR: Colonic diverticulosis . Large polypoid lesion in the vicinity of the hepatic flexure status post saline-assisted piecmeal snare polypectomy  with ablation and tattooing as described. Sigmoid polyp removed as described above. sigmoid colon polyp hyperplastic, hepatic flexure polyp with TA with focal high grade dysplasia   . ESOPHAGEAL DILATION N/A 03/14/2015  Procedure: ESOPHAGEAL DILATION;  Surgeon: Daneil Dolin, MD;  Location: [facility];  Service: Endoscopy;  Laterality: N/A;  Maloney 75  . ESOPHAGOGASTRODUODENOSCOPY N/A 05/19/2016   Procedure: ESOPHAGOGASTRODUODENOSCOPY (EGD);  Surgeon: Daneil Dolin, MD;  [facility];  Service: Endoscopy;  Laterality: N/A;  215  . ESOPHAGOGASTRODUODENOSCOPY N/A 01/28/2017   Procedure: ESOPHAGOGASTRODUODENOSCOPY (EGD);  Surgeon: Daneil Dolin, MD;  Location:  [facility];  Service: Endoscopy;  Laterality: N/A;  . ESOPHAGOGASTRODUODENOSCOPY (EGD) WITH PROPOFOL N/A 03/14/2015   RMR: Mild erosive reflux esophagitis status post passage o f a Maloney dilator. Abnormal gastric mucosa of uncertain significance as described above. status post biopsy, benign  . MALONEY DILATION N/A 05/19/2016   Procedure: [name] DILATION;  Surgeon: Daneil Dolin, MD;  [facility];  Service: Endoscopy;  Laterality: N/A;  . [name] DILATION N/A 01/28/2017   Procedure: [name] DILATION;  Surgeon: Daneil Dolin, MD;  [facility];  Service: Endoscopy;  Laterality: N/A;  . PARTIAL COLECTOMY  08/30/2015   polyp with adenocarcinoma  . POLYPECTOMY N/A 07/04/2015   Procedure: POLYPECTOMY;  Surgeon: Daneil Dolin, MD;  Location: [facility];  Service: Endoscopy;  Laterality: N/A;  . PORTACATH PLACEMENT Right 09/2014  . TUBAL LIGATION      Allergies Codeine  Social History Social History   Tobacco Use  . Smoking status: Current Every Day Smoker    Packs/day: 0.25    Years: 25.00    Pack years: 6.25    Types: Cigarettes  . Smokeless tobacco: Never Used  . Tobacco comment: Is trying to quit/wean off.  Substance Use Topics  . Alcohol use: No  . Drug use: No    Review of Systems Constitutional: Negative for fever. ENT: Positive for sinus congestion Cardiovascular: Negative for chest pain. Respiratory: Negative for shortness of breath. Gastrointestinal: Negative for abdominal pain, vomiting and diarrhea. Genitourinary: Positive for dysuria and polyuria Musculoskeletal: Positive for back pain Skin: Negative for rash. Neurological: Negative for headaches, focal weakness or numbness.  All systems negative/normal/unremarkable except as stated in the HPI  ____________________________________________   PHYSICAL EXAM:  VITAL SIGNS: ED Triage Vitals [09/04/17 0911]  Enc Vitals Group     BP      Pulse      Resp      Temp      Temp src       SpO2      Weight 173 lb (78.5 kg)     Height 5\' 3"  (1.6 m)     Head Circumference      Peak Flow      Pain Score 8     Pain Loc      Pain Edu?      Excl. in [place]?     Constitutional: Alert and oriented.  Mild distress from pain Eyes: Conjunctivae are normal. Normal extraocular movements. ENT   Head: Normocephalic and atraumatic.   Nose: No congestion/rhinnorhea.   Mouth/Throat: Mucous membranes are moist.   Neck: No stridor. Cardiovascular: Normal rate, regular rhythm. No murmurs, rubs, or gallops. Respiratory: Normal respiratory effort without tachypnea nor retractions. Breath sounds are clear and equal bilaterally. No wheezes/rales/rhonchi. Gastrointestinal: Suprapubic tenderness, no rebound or guarding.  Normal bowel sounds. Musculoskeletal: Nontender with normal range of motion in extremities. No lower extremity tenderness nor edema. Neurologic:  Normal speech and language. No gross focal neurologic deficits are appreciated.  Skin:  Skin is warm, dry and intact. No rash noted. Psychiatric: Mood and affect  are normal. Speech and behavior are normal.  ____________________________________________  ED COURSE:  Pertinent labs & imaging results that were available during my care of the patient were reviewed by me and considered in my medical decision making (see chart for details). Patient presents for pelvic pain and dysuria, we will assess with labs and imaging as indicated.   Procedures ____________________________________________   LABS (pertinent positives/negatives)  Labs Reviewed  COMPREHENSIVE METABOLIC PANEL - Abnormal; Notable for the following components:      Result Value   Glucose, Bld 115 (*)    All other components within normal limits  URINALYSIS, COMPLETE (UACMP) WITH MICROSCOPIC - Abnormal; Notable for the following components:   Color, Urine YELLOW (*)    APPearance CLEAR (*)    Hgb urine dipstick SMALL (*)    Squamous Epithelial / LPF 0-5 (*)     All other components within normal limits  URINE CULTURE  CBC WITH DIFFERENTIAL/PLATELET    RADIOLOGY Images were viewed by me  Renal ultrasound is normal  ____________________________________________  DIFFERENTIAL DIAGNOSIS   UTI, renal colic, urinary retention, bladder spasms, constipation  FINAL ASSESSMENT AND PLAN  Bladder spasms, constipation   Plan: Patient had presented for dysuria and pelvic pain. Patient's labs are unremarkable. Patient's imaging was also unremarkable.  This is likely a combination of constipation and bladder spasms.  She has a complicated prolonged medical history including multiple surgeries.  I have advised her to take laxatives at home and I will try medication for bladder spasms.   Earleen Newport, MD   Note: This note was generated in part or whole with voice recognition software. Voice recognition is usually quite accurate but there are transcription errors that can and very often do occur. I apologize for any typographical errors that were not detected and corrected.     Earleen Newport, MD 09/04/17 [phone]

## 2017-09-04 NOTE — ED Triage Notes (Signed)
Patient presents to the ED with pelvic pain and dysuria x 1 week.  Patient has history of endometrial cancer and intestinal cancer and states since she had surgery approx. 1 year ago she has had issues with "bladder problems."  Patient is obviously uncomfortable at this time, holding her lower abdomen.

## 2017-09-05 LAB — URINE CULTURE
CULTURE: NO GROWTH
SPECIAL REQUESTS: NORMAL

## 2017-09-23 ENCOUNTER — Other Ambulatory Visit: Payer: Self-pay | Admitting: Adult Health

## 2017-09-23 DIAGNOSIS — Z1231 Encounter for screening mammogram for malignant neoplasm of breast: Secondary | ICD-10-CM

## 2017-09-27 ENCOUNTER — Emergency Department (HOSPITAL_COMMUNITY): Payer: Medicare Other

## 2017-09-27 ENCOUNTER — Emergency Department (HOSPITAL_COMMUNITY)
Admission: EM | Admit: 2017-09-27 | Discharge: 2017-09-28 | Disposition: A | Discharge status: Home / Self Care | Payer: Medicare Other | Attending: Emergency Medicine | Admitting: Emergency Medicine

## 2017-09-27 ENCOUNTER — Other Ambulatory Visit: Payer: Self-pay

## 2017-09-27 ENCOUNTER — Encounter (HOSPITAL_COMMUNITY): Payer: Self-pay | Admitting: Adult Health

## 2017-09-27 DIAGNOSIS — F17210 Nicotine dependence, cigarettes, uncomplicated: Secondary | ICD-10-CM | POA: Insufficient documentation

## 2017-09-27 DIAGNOSIS — R109 Unspecified abdominal pain: Secondary | ICD-10-CM

## 2017-09-27 DIAGNOSIS — J449 Chronic obstructive pulmonary disease, unspecified: Secondary | ICD-10-CM | POA: Diagnosis not present

## 2017-09-27 DIAGNOSIS — R079 Chest pain, unspecified: Secondary | ICD-10-CM | POA: Diagnosis not present

## 2017-09-27 DIAGNOSIS — Z79899 Other long term (current) drug therapy: Secondary | ICD-10-CM | POA: Insufficient documentation

## 2017-09-27 DIAGNOSIS — K760 Fatty (change of) liver, not elsewhere classified: Secondary | ICD-10-CM | POA: Diagnosis not present

## 2017-09-27 DIAGNOSIS — R1012 Left upper quadrant pain: Secondary | ICD-10-CM | POA: Diagnosis not present

## 2017-09-27 DIAGNOSIS — R197 Diarrhea, unspecified: Secondary | ICD-10-CM | POA: Diagnosis not present

## 2017-09-27 LAB — CBC
HEMATOCRIT: 40.8 % (ref 36.0–46.0)
HEMOGLOBIN: 13.5 g/dL (ref 12.0–15.0)
MCH: 29.9 pg (ref 26.0–34.0)
MCHC: 33.1 g/dL (ref 30.0–36.0)
MCV: 90.3 fL (ref 78.0–100.0)
Platelets: 232 10*3/uL (ref 150–400)
RBC: 4.52 MIL/uL (ref 3.87–5.11)
RDW: 13.4 % (ref 11.5–15.5)
WBC: 9.4 10*3/uL (ref 4.0–10.5)

## 2017-09-27 NOTE — ED Triage Notes (Signed)
Presents with "this has been going on for a long time but I called Dr. Cecille Rubin and told them what my symptoms are and they wanted me to be seen. I am burping up bitter stuff and hurting all the way down my left side and between my shoulder blades and into my lungs. It feels like a heart attack at times. I have never had a heart attack but feels like one. I went to [facility] with the same symptoms and they didn't find anything wrong." Endorses SOB, Endorses dizziness and falling often the last few days.

## 2017-09-28 ENCOUNTER — Ambulatory Visit: Payer: Medicaid Other | Admitting: Internal Medicine

## 2017-09-28 ENCOUNTER — Emergency Department (HOSPITAL_COMMUNITY): Payer: Medicare Other

## 2017-09-28 DIAGNOSIS — K760 Fatty (change of) liver, not elsewhere classified: Secondary | ICD-10-CM | POA: Diagnosis not present

## 2017-09-28 DIAGNOSIS — R1012 Left upper quadrant pain: Secondary | ICD-10-CM | POA: Diagnosis not present

## 2017-09-28 LAB — I-STAT TROPONIN, ED: Troponin i, poc: 0 ng/mL (ref 0.00–0.08)

## 2017-09-28 LAB — BASIC METABOLIC PANEL
Anion gap: 11 (ref 5–15)
BUN: 13 mg/dL (ref 6–20)
CO2: 24 mmol/L (ref 22–32)
Calcium: 9.3 mg/dL (ref 8.9–10.3)
Chloride: 103 mmol/L (ref 101–111)
Creatinine, Ser: 0.95 mg/dL (ref 0.44–1.00)
GFR calc Af Amer: >60 mL/min (ref >60)
GFR calc non Af Amer: >60 mL/min (ref >60)
Glucose, Bld: 108 mg/dL — ABNORMAL HIGH (ref 65–99)
Potassium: 3.8 mmol/L (ref 3.5–5.1)
Sodium: 138 mmol/L (ref 135–145)

## 2017-09-28 LAB — HEPATIC FUNCTION PANEL
ALK PHOS: 55 U/L (ref 38–126)
ALT: 21 U/L (ref 14–54)
AST: 19 U/L (ref 15–41)
Albumin: 4.2 g/dL (ref 3.5–5.0)
Bilirubin, Direct: 0.1 mg/dL (ref 0.1–0.5)
Indirect Bilirubin: 0.2 mg/dL — ABNORMAL LOW (ref 0.3–0.9)
TOTAL PROTEIN: 7.5 g/dL (ref 6.5–8.1)
Total Bilirubin: 0.3 mg/dL (ref 0.3–1.2)

## 2017-09-28 LAB — LIPASE, BLOOD: Lipase: 34 U/L (ref 11–51)

## 2017-09-28 MED ORDER — MORPHINE SULFATE (PF) 4 MG/ML IV SOLN
4.0000 mg | Freq: Once | INTRAVENOUS | Status: AC
  Administered 2017-09-28: 4 mg via INTRAVENOUS
  Filled 2017-09-28: qty 1

## 2017-09-28 MED ORDER — ONDANSETRON HCL 4 MG/2ML IJ SOLN
4.0000 mg | Freq: Once | INTRAMUSCULAR | Status: AC
  Administered 2017-09-28: 4 mg via INTRAVENOUS
  Filled 2017-09-28: qty 2

## 2017-09-28 MED ORDER — IOPAMIDOL (ISOVUE-300) INJECTION 61%
100.0000 mL | Freq: Once | INTRAVENOUS | Status: AC | PRN
  Administered 2017-09-28: 100 mL via INTRAVENOUS

## 2017-09-28 NOTE — Discharge Instructions (Signed)
You were seen today for abdominal and flank pain.  Her workup is reassuring.  Your CT scan does show some fluid-filled bowel which can be consistent with a diarrheal illness.  Follow-up with Dr. Gala Romney as scheduled.

## 2017-09-28 NOTE — ED Provider Notes (Signed)
[facility] EMERGENCY DEPARTMENT Provider Note   CSN: [id number] Arrival date & time: 09/27/17  2244     History   Chief Complaint Chief Complaint  Patient presents with  . Chest Pain    HPI Julia Osborne is a 57 y.o. female.  HPI  This is a 57 year old female who presents with left flank pain left upper quadrant pain.  Patient reports that she has had ongoing issues for some time.  She was seen and evaluated at [facility] 1 month ago for similar symptoms she states "they could not find out what was wrong with me.  She has not yet followed up with GI.  She states that her pain is worse with eating and with moving.  It is along her left flank and moves up into her shoulder blades.  At times she feels some chest pain as well.  Here she is chest pain-free.  She denies any shortness of breath, recent fevers, recent illnesses.  Reports intermittent diarrhea.  Denies any nausea or vomiting.  Past Medical History:  Diagnosis Date  . Adenocarcinoma of colon ([place]) 09/13/2015  . Anemia   . Anxiety   . Asthma   . Cancer ([place])    endometrial; cancer cells in intestine  . COPD (chronic obstructive pulmonary disease) ([place])    no definite diagnosis  . Depression   . Diverticulitis   . Dyspareunia 05/16/2015  . Family history of cancer   . Family history of kidney cancer   . GERD (gastroesophageal reflux disease)   . Indigestion   . Lynch syndrome   . Neuropathy    feet and hands  . Uterine fibroid   . Vaginal dryness 05/16/2015  . Vaginal itching 07/16/2015  . Vaginal Pap smear, abnormal     Patient Active Problem List   Diagnosis Date Noted  . Abdominal pain 12/21/2016  . Lynch syndrome 10/31/2015  . Genetic testing 10/21/2015  . Abdominal pain, left upper quadrant 10/17/2015  . Family history of cancer   . Family history of kidney cancer   . Adenocarcinoma of colon ([place]) 09/13/2015  . Colon neoplasm 08/30/2015  . Vaginal itching 07/16/2015  . History of colonic  polyps   . 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 drug ([place]) 06/28/2015  . Constipation 06/05/2015  . Abnormal CT scan, sigmoid colon 06/05/2015  . Vaginal dryness 05/16/2015  . Dyspareunia 05/16/2015  . Mucosal abnormality of stomach   . Reflux esophagitis   . Dysphagia   . Hematochezia   . Diverticulosis of colon without hemorrhage   . Rectal bleeding 02/14/2015  . GERD (gastroesophageal reflux disease) 02/14/2015  . Esophageal dysphagia 02/14/2015  . Abdominal pain, epigastric 02/14/2015  . Nausea with vomiting 02/14/2015  . Endometrial cancer ([place]) 10/09/2014  . Conversion reaction   . Depression   . Aphasia 09/11/2014    Past Surgical History:  Procedure Laterality Date  . ABDOMINAL HYSTERECTOMY    . APPENDECTOMY    . BIOPSY N/A 03/14/2015   Procedure: BIOPSY;  Surgeon: Daneil Dolin, MD;  Location: [facility] ORS;  Service: Endoscopy;  Laterality: N/A;  Gastric  . COLONOSCOPY N/A 01/28/2017   Procedure: COLONOSCOPY;  Surgeon: Daneil Dolin, MD;  Location: [facility] ENDO SUITE;  Service: Endoscopy;  Laterality: N/A;  11:30am  . COLONOSCOPY WITH PROPOFOL N/A 03/14/2015   RMR: Internal hemorrhoids. colonic diverticulosis. Incomplete examination. Prepartation inadequate.  . COLONOSCOPY WITH PROPOFOL N/A 07/04/2015   RMR: Colonic diverticulosis . Large polypoid lesion in the vicinity of the  hepatic flexure status post saline-assisted piecmeal snare polypectomy  with ablation and tattooing as described. Sigmoid polyp removed as described above. sigmoid colon polyp hyperplastic, hepatic flexure polyp with TA with focal high grade dysplasia   . ESOPHAGEAL DILATION N/A 03/14/2015   Procedure: ESOPHAGEAL DILATION;  Surgeon: Daneil Dolin, MD;  Location: [facility] ORS;  Service: Endoscopy;  Laterality: N/A;  [place] 62  . ESOPHAGOGASTRODUODENOSCOPY N/A 05/19/2016   Procedure: ESOPHAGOGASTRODUODENOSCOPY (EGD);  Surgeon: Daneil Dolin, MD;  Location: [facility];  Service: Endoscopy;  Laterality: N/A;  215  .  ESOPHAGOGASTRODUODENOSCOPY N/A 01/28/2017   Procedure: ESOPHAGOGASTRODUODENOSCOPY (EGD);  Surgeon: Daneil Dolin, MD;  Location: [facility];  Service: Endoscopy;  Laterality: N/A;  . ESOPHAGOGASTRODUODENOSCOPY (EGD) WITH PROPOFOL N/A 03/14/2015   RMR: Mild erosive reflux esophagitis status post passage o f a [place] dilator. Abnormal gastric mucosa of uncertain significance as described above. status post biopsy, benign  . MALONEY DILATION N/A 05/19/2016   Procedure: [name] DILATION;  Surgeon: Daneil Dolin, MD;  Location: [facility];  Service: Endoscopy;  Laterality: N/A;  . [name] DILATION N/A 01/28/2017   Procedure: [name] DILATION;  Surgeon: Daneil Dolin, MD;  Location: [facility];  Service: Endoscopy;  Laterality: N/A;  . PARTIAL COLECTOMY  08/30/2015   polyp with adenocarcinoma  . POLYPECTOMY N/A 07/04/2015   Procedure: POLYPECTOMY;  Surgeon: Daneil Dolin, MD;  Location: [facility];  Service: Endoscopy;  Laterality: N/A;  . PORTACATH PLACEMENT Right 09/2014  . TUBAL LIGATION      OB History    Gravida Para Term Preterm AB Living   2 2       2    SAB TAB Ectopic Multiple Live Births                   Home Medications    Prior to Admission medications   Medication Sig Start Date End Date Taking? Authorizing Provider  albuterol (PROVENTIL HFA;VENTOLIN HFA) 108 (90 Base) MCG/ACT inhaler Inhale 2 puffs into the lungs every 2 (two) hours as needed for wheezing or shortness of breath (cough). 10/17/15   Annitta Needs, NP  albuterol (PROVENTIL) (2.5 MG/3ML) 0.083% nebulizer solution Take 3 mLs (2.5 mg total) by nebulization every 6 (six) hours as needed for wheezing or shortness of breath. Patient taking differently: Take 2.5 mg by nebulization daily as needed for wheezing or shortness of breath.  01/16/16   Penland, Kelby Fam, MD  ALPRAZolam Duanne Moron) 0.25 MG tablet Take 0.125 mg by mouth daily.     [provider]  amoxicillin (AMOXIL) 875 MG tablet Take 1 tablet (875  mg total) by mouth 2 (two) times daily. 09/04/17   Earleen Newport, MD  citalopram (CELEXA) 40 MG tablet Take 40 mg by mouth at bedtime. 01/05/17   [provider]  DEXILANT 60 MG capsule TAKE ONE CAPSULE BY MOUTH DAILY 02/23/17   Annitta Needs, NP  diazepam (VALIUM) 5 MG tablet Take 1 tablet (5 mg total) by mouth every 8 (eight) hours as needed for muscle spasms. 09/04/17   Earleen Newport, MD  linaclotide [facility]) 145 MCG CAPS capsule Take 1 capsule (145 mcg total) by mouth daily before breakfast. 12/21/16   Carlis Stable, NP  loratadine (CLARITIN) 10 MG tablet Take 10 mg by mouth daily.    [provider]  montelukast (SINGULAIR) 10 MG tablet TAKE 1 TABLET NIGHTLY FOR PERSISTENT ALLERGIC SYMPTOMS 02/03/17   [provider]  Naphazoline-Pheniramine (  ALLERGY EYE OP) Place 1-2 drops into both eyes 2 (two) times daily as needed (ITCHY, WATERY EYES).    [provider]  ondansetron (ZOFRAN) 8 MG tablet Take 8 mg by mouth daily as needed (CAR SICKNESS).    [provider]  oxymetazoline (AFRIN) 0.05 % nasal spray Place 1 spray into both nostrils daily as needed for congestion.    [provider]  phenazopyridine (PYRIDIUM) 200 MG tablet Take 1 tablet (200 mg total) by mouth 3 (three) times daily as needed for pain. 09/04/17 09/04/18  Earleen Newport, MD  pregabalin (LYRICA) 75 MG capsule Take 1 capsule (75 mg total) by mouth at bedtime. 07/07/17   Holley Bouche, NP  sucralfate (CARAFATE) 1 GM/10ML suspension Take 10 mLs (1 g total) by mouth 4 (four) times daily. 12/21/16   Carlis Stable, NP  traMADol (ULTRAM) 50 MG tablet TAKE 1 TABLET BY MOUTH EVERY 6 HOURS AS NEEDED 07/23/17   Baird Cancer, PA-C  traZODone (DESYREL) 50 MG tablet Take 50 mg by mouth at bedtime.    [provider]    Family History Family History  Problem Relation Age of Onset  . Other Mother        clot that went to heart, deceased age 63ss  . Heart  attack Father        age 29s, deceased  . Stroke Father   . Cancer Father        "at death determined he was ate up with cancer"  . Hypertension Sister   . Kidney cancer Sister 55  . Diabetes Brother   . Hypertension Brother   . Endometriosis Daughter   . Heart attack Maternal Grandfather   . Diabetes Sister   . Brain cancer Paternal Aunt   . Cancer Paternal Uncle        NOS  . Cancer Paternal Uncle        NOS  . Cancer Paternal Uncle        NOS  . Colon cancer Neg Hx     Social History Social History   Tobacco Use  . Smoking status: Current Every Day Smoker    Packs/day: 0.25    Years: 25.00    Pack years: 6.25    Types: Cigarettes  . Smokeless tobacco: Never Used  . Tobacco comment: Is trying to quit/wean off.  Substance Use Topics  . Alcohol use: No  . Drug use: No     Allergies   Codeine   Review of Systems Review of Systems  Constitutional: Negative for fever.  Respiratory: Negative for shortness of breath.   Cardiovascular: Positive for chest pain.  Gastrointestinal: Positive for abdominal pain and diarrhea. Negative for nausea and vomiting.  Genitourinary: Positive for flank pain. Negative for dysuria.  Musculoskeletal: Negative for back pain.  All other systems reviewed and are negative.    Physical Exam Updated Vital Signs BP 106/61   Pulse 63   Temp (!) 97.4 F (36.3 C) (Oral)   Resp 18   Ht 5\' 3"  (1.6 m)   SpO2 97%   BMI 30.65 kg/m   Physical Exam  Constitutional: She is oriented to person, place, and time. She appears well-developed and well-nourished.  HENT:  Head: Normocephalic and atraumatic.  Cardiovascular: Normal rate, regular rhythm, normal heart sounds and normal pulses.  No murmur heard. Pulmonary/Chest: Effort normal. No respiratory distress. She has no wheezes.  Abdominal: Soft. Bowel sounds are normal. She exhibits no distension. There  is tenderness.  Left upper quadrant tenderness palpation, no rebound or guarding    Neurological: She is alert and oriented to person, place, and time.  Skin: Skin is warm and dry.  Psychiatric: She has a normal mood and affect.  Nursing note and vitals reviewed.    ED Treatments / Results  Labs (all labs ordered are listed, but only abnormal results are displayed) Labs Reviewed  BASIC METABOLIC PANEL - Abnormal; Notable for the following components:      Result Value   Glucose, Bld 108 (*)    All other components within normal limits  HEPATIC FUNCTION PANEL - Abnormal; Notable for the following components:   Indirect Bilirubin 0.2 (*)    All other components within normal limits  CBC  LIPASE, BLOOD  I-STAT TROPONIN, ED    EKG  EKG Interpretation  Date/Time:  Monday September 27 2017 22:59:34 EST Ventricular Rate:  65 PR Interval:  140 QRS Duration: 72 QT Interval:  434 QTC Calculation: 451 R Axis:   68 Text Interpretation:  Normal sinus rhythm Normal ECG Confirmed by Thayer Jew [phone]) on 09/27/2017 11:47:32 PM       Radiology Dg Chest 2 View  Result Date: 09/27/2017 CLINICAL DATA:  Chest pain beneath right breast. EXAM: CHEST  2 VIEW COMPARISON:  12/30/2015 FINDINGS: Clear lungs. No pneumonic consolidation, CHF or pneumothorax. No effusion. Normal size heart and mediastinal contours. Catheter tip is stable in the mid to distal SVC. No acute osseous abnormality. IMPRESSION: No active cardiopulmonary disease. Electronically Signed   By: Ashley Royalty M.D.   On: 09/27/2017 23:33   Ct Abdomen Pelvis W Contrast  Result Date: 09/28/2017 CLINICAL DATA:  Abdominal pain, gastroenteritis or colitis suspected. EXAM: CT ABDOMEN AND PELVIS WITH CONTRAST TECHNIQUE: Multidetector CT imaging of the abdomen and pelvis was performed using the standard protocol following bolus administration of intravenous contrast. CONTRAST:  137mL ISOVUE-300 IOPAMIDOL (ISOVUE-300) INJECTION 61% COMPARISON:  CT 11/10/2016 FINDINGS: Lower chest: Lung bases are clear allowing for  breathing motion artifact. No pleural fluid. Hepatobiliary: Mild hepatic steatosis without focal lesion. Gallbladder physiologically distended, no calcified stone. No biliary dilatation. Pancreas: No ductal dilatation or inflammation. Spleen: Normal in size without focal abnormality. Adrenals/Urinary Tract: Normal adrenal glands. No hydronephrosis or perinephric edema. Homogeneous renal enhancement with symmetric excretion on delayed phase imaging. Urinary bladder is partially distended without wall thickening. Stomach/Bowel: Stomach is nondistended. No small bowel inflammation or wall thickening. Ileocolic anastomosis in the right abdomen without associated wall thickening. Fluid/liquid stool in the ascending, transverse, and descending colon. No associated colonic wall thickening. Possible small fat lobule adjacent to the sigmoid colon with question of mild surrounding stranding. Vascular/Lymphatic: Mild aortic atherosclerosis. No aneurysm. No enlarged abdominal or pelvic lymph nodes. Reproductive: Status post hysterectomy. No adnexal masses. Other: Small fat containing umbilical hernia. No free air, free fluid, or intra-abdominal fluid collection. Musculoskeletal: There are no acute or suspicious osseous abnormalities. Degenerative disc disease at L5-S1. IMPRESSION: 1. Fluid/liquid stool in the ascending, transverse, and descending colon which can be seen with diarrheal process. No colonic inflammation. 2. Possible epiploic appendagitis about the sigmoid colon. 3. Mild hepatic steatosis. 4.  Aortic Atherosclerosis (ICD10-I70.0). Electronically Signed   By: Jeb Levering M.D.   On: 09/28/2017 03:27    Procedures Procedures (including critical care time)  Medications Ordered in ED Medications  morphine 4 MG/ML injection 4 mg (4 mg Intravenous Given 09/28/17 0028)  ondansetron (ZOFRAN) injection 4 mg (4 mg Intravenous Given 09/28/17 0028)  iopamidol (ISOVUE-300) 61 % injection 100 mL (100 mLs Intravenous  Contrast Given 09/28/17 0102)     Initial Impression / Assessment and Plan / ED Course  I have reviewed the triage vital signs and the nursing notes.  Pertinent labs & imaging results that were available during my care of the patient were reviewed by me and considered in my medical decision making (see chart for details).     Patient presents with left flank pain and chest pain.  She is overall nontoxic appearing.  Symptoms reported to be acute on chronic.  She has follow-up with GI scheduled.  Reports intermittent diarrhea.  She is overall nontoxic appearing and vital signs reassuring.  Minimal tenderness on exam.  Workup initiated.  Patient given pain and nausea medication.  Basic lab work obtained and largely reassuring.  No evidence of pancreatitis.  LFTs are normal.  Troponin is negative and EKG shows no signs of ischemia.  CT scan obtained given history of colon cancer.  CT scan only shows evidence of some fluid-filled colon.  Patient does report some diarrhea.  Question epiploic appendage otitis.  This is not where the patient's symptoms are clinically.  On recheck, patient is stable.  Recommend close follow-up with GI as already scheduled.  After history, exam, and medical workup I feel the patient has been appropriately medically screened and is safe for discharge home. Pertinent diagnoses were discussed with the patient. Patient was given return precautions.   Final Clinical Impressions(s) / ED Diagnoses   Final diagnoses:  Left flank pain    ED Discharge Orders    None       Merryl Hacker, MD 09/28/17 [phone]

## 2017-10-06 ENCOUNTER — Ambulatory Visit (HOSPITAL_COMMUNITY)
Admission: RE | Admit: 2017-10-06 | Discharge: 2017-10-06 | Disposition: A | Discharge status: Home / Self Care | Payer: Medicare Other | Source: Ambulatory Visit | Attending: Adult Health | Admitting: Adult Health

## 2017-10-06 DIAGNOSIS — Z1231 Encounter for screening mammogram for malignant neoplasm of breast: Secondary | ICD-10-CM | POA: Diagnosis not present

## 2017-10-22 ENCOUNTER — Encounter: Payer: Self-pay | Admitting: Internal Medicine

## 2017-10-22 ENCOUNTER — Other Ambulatory Visit: Payer: Self-pay

## 2017-10-22 ENCOUNTER — Ambulatory Visit (INDEPENDENT_AMBULATORY_CARE_PROVIDER_SITE_OTHER): Payer: Medicare Other | Admitting: Internal Medicine

## 2017-10-22 VITALS — BP 181/74 | HR 70 | Temp 97.2°F | Ht 63.0 in | Wt 163.2 lb

## 2017-10-22 DIAGNOSIS — K2100 Gastro-esophageal reflux disease with esophagitis, without bleeding: Secondary | ICD-10-CM

## 2017-10-22 DIAGNOSIS — R1310 Dysphagia, unspecified: Secondary | ICD-10-CM

## 2017-10-22 DIAGNOSIS — R109 Unspecified abdominal pain: Secondary | ICD-10-CM

## 2017-10-22 DIAGNOSIS — D490 Neoplasm of unspecified behavior of digestive system: Secondary | ICD-10-CM

## 2017-10-22 DIAGNOSIS — C182 Malignant neoplasm of ascending colon: Secondary | ICD-10-CM

## 2017-10-22 DIAGNOSIS — Z1509 Genetic susceptibility to other malignant neoplasm: Secondary | ICD-10-CM

## 2017-10-22 DIAGNOSIS — R1319 Other dysphagia: Secondary | ICD-10-CM

## 2017-10-22 MED ORDER — SUCRALFATE 1 GM/10ML PO SUSP: 1.0000 g | Freq: Four times a day (QID) | ORAL | 1 refills | Status: DC

## 2017-10-22 NOTE — Patient Instructions (Signed)
Scheduling EGD with esophageal dilation. GERD and dysphagia. Propofol.  Continue Dexilant 60 mg   Continue linzess - take every day  Further recommendations to follow

## 2017-10-22 NOTE — Progress Notes (Signed)
Carafate 1GM/10Ml refilled in office today.

## 2017-10-22 NOTE — Patient Instructions (Signed)
Changed NPO time on printed instructions for EGD to 8:00am.

## 2017-10-22 NOTE — Progress Notes (Signed)
Primary Care Physician:  System, Pcp Not In Primary Gastroenterologist:  Dr. Gala Romney  Pre-Procedure History & Physical: HPI:  Julia Osborne is a 57 y.o. female here for for recurrent dysphagia, history of GERD, history of adenocarcinoma the colon - status post right hemicolectomy. History of stage IV uterine cancer and recently diagnosed Lynch syndrome. Patient is not having any bowel issues other than chronic constipation managed with Linzess. Residual Colon and rectum appear normal on colonoscopy less than a year ago. She will be due for high risk surveillance a year from now.  Recurrent esophageal dysphagia. She responded nicely to esophageal dilation in May of last year but improvement was only short lived. She had some focal gastritis (biopsy-proven). Says reflux symptoms suboptimally controlled on Dexilant 60 mg daily.  She now lives in [place] and is planning to have all of her care transitioned to [facility]. She is looking for a gastroenterologist down there.  Past Medical History:  Diagnosis Date  . Adenocarcinoma of colon ([place]) 09/13/2015  . Anemia   . Anxiety   . Asthma   . Cancer ([place])    endometrial; cancer cells in intestine  . COPD (chronic obstructive pulmonary disease) ([place])    no definite diagnosis  . Depression   . Diverticulitis   . Dyspareunia 05/16/2015  . Family history of cancer   . Family history of kidney cancer   . GERD (gastroesophageal reflux disease)   . Indigestion   . Lynch syndrome   . Neuropathy    feet and hands  . Uterine fibroid   . Vaginal dryness 05/16/2015  . Vaginal itching 07/16/2015  . Vaginal Pap smear, abnormal     Past Surgical History:  Procedure Laterality Date  . ABDOMINAL HYSTERECTOMY    . APPENDECTOMY    . BIOPSY N/A 03/14/2015   Procedure: BIOPSY;  Surgeon: Daneil Dolin, MD;  Location: [facility];  Service: Endoscopy;  Laterality: N/A;  Gastric  . COLONOSCOPY N/A 01/28/2017   Procedure:  COLONOSCOPY;  Surgeon: Daneil Dolin, MD;  Location: [facility];  Service: Endoscopy;  Laterality: N/A;  11:30am  . COLONOSCOPY WITH PROPOFOL N/A 03/14/2015   RMR: Internal hemorrhoids. colonic diverticulosis. Incomplete examination. Prepartation inadequate.  . COLONOSCOPY WITH PROPOFOL N/A 07/04/2015   RMR: Colonic diverticulosis . Large polypoid lesion in the vicinity of the hepatic flexure status post saline-assisted piecmeal snare polypectomy  with ablation and tattooing as described. Sigmoid polyp removed as described above. sigmoid colon polyp hyperplastic, hepatic flexure polyp with TA with focal high grade dysplasia   . ESOPHAGEAL DILATION N/A 03/14/2015   Procedure: ESOPHAGEAL DILATION;  Surgeon: Daneil Dolin, MD;  Location: [facility];  Service: Endoscopy;  Laterality: N/A;  Maloney 77  . ESOPHAGOGASTRODUODENOSCOPY N/A 05/19/2016   Procedure: ESOPHAGOGASTRODUODENOSCOPY (EGD);  Surgeon: Daneil Dolin, MD;  [facility];  Service: Endoscopy;  Laterality: N/A;  215  . ESOPHAGOGASTRODUODENOSCOPY N/A 01/28/2017   Procedure: ESOPHAGOGASTRODUODENOSCOPY (EGD);  Surgeon: Daneil Dolin, MD;  [facility];  Service: Endoscopy;  Laterality: N/A;  . ESOPHAGOGASTRODUODENOSCOPY (EGD) WITH PROPOFOL N/A 03/14/2015   RMR: Mild erosive reflux esophagitis status post passage o f a Maloney dilator. Abnormal gastric mucosa of uncertain significance as described above. status post biopsy, benign  . MALONEY DILATION N/A 05/19/2016   Procedure: [name] DILATION;  Surgeon: Daneil Dolin, MD;  [facility];  Service: Endoscopy;  Laterality: N/A;  . MALONEY DILATION N/A 01/28/2017  Procedure: MALONEY DILATION;  Surgeon: Daneil Dolin, MD;  [facility]: [facility];  Service: Endoscopy;  Laterality: N/A;  . PARTIAL COLECTOMY  08/30/2015   polyp with adenocarcinoma  . POLYPECTOMY N/A 07/04/2015   Procedure: POLYPECTOMY;  Surgeon: Daneil Dolin, MD;  Location: [facility];  Service:  Endoscopy;  Laterality: N/A;  . PORTACATH PLACEMENT Right 09/2014  . TUBAL LIGATION      Prior to Admission medications   Medication Sig Start Date End Date Taking? Authorizing Provider  albuterol (PROVENTIL HFA;VENTOLIN HFA) 108 (90 Base) MCG/ACT inhaler Inhale 2 puffs into the lungs every 2 (two) hours as needed for wheezing or shortness of breath (cough). 10/17/15  Yes Annitta Needs, NP  albuterol (PROVENTIL) (2.5 MG/3ML) 0.083% nebulizer solution Take 3 mLs (2.5 mg total) by nebulization every 6 (six) hours as needed for wheezing or shortness of breath. Patient taking differently: Take 2.5 mg by nebulization daily as needed for wheezing or shortness of breath.  01/16/16  Yes Penland, Kelby Fam, MD  ALPRAZolam Duanne Moron) 0.25 MG tablet Take 0.125 mg by mouth as needed for anxiety.    Yes [provider]  citalopram (CELEXA) 40 MG tablet Take 40 mg by mouth at bedtime. 01/05/17  Yes [provider]  DEXILANT 60 MG capsule TAKE ONE CAPSULE BY MOUTH DAILY 02/23/17  Yes Annitta Needs, NP  linaclotide [facility]) 145 MCG CAPS capsule Take 1 capsule (145 mcg total) by mouth daily before breakfast. 12/21/16  Yes Carlis Stable, NP  Naphazoline-Pheniramine (ALLERGY EYE OP) Place 1-2 drops into both eyes 2 (two) times daily as needed (ITCHY, WATERY EYES).   Yes [provider]  ondansetron (ZOFRAN) 8 MG tablet Take 8 mg by mouth daily as needed (CAR SICKNESS).   Yes [provider]  phenazopyridine (PYRIDIUM) 200 MG tablet Take 1 tablet (200 mg total) by mouth 3 (three) times daily as needed for pain. 09/04/17 09/04/18 Yes Earleen Newport, MD  traMADol (ULTRAM) 50 MG tablet TAKE 1 TABLET BY MOUTH EVERY 6 HOURS AS NEEDED 07/23/17  Yes Kefalas, Manon Hilding, PA-C  traZODone (DESYREL) 50 MG tablet Take 50 mg by mouth at bedtime.   Yes [provider]  amoxicillin (AMOXIL) 875 MG tablet Take 1 tablet (875 mg total) by mouth 2 (two) times daily. Patient not taking: Reported on  10/22/2017 09/04/17   Earleen Newport, MD  diazepam (VALIUM) 5 MG tablet Take 1 tablet (5 mg total) by mouth every 8 (eight) hours as needed for muscle spasms. Patient not taking: Reported on 10/22/2017 09/04/17   Earleen Newport, MD  loratadine (CLARITIN) 10 MG tablet Take 10 mg by mouth daily.    [provider]  montelukast (SINGULAIR) 10 MG tablet TAKE 1 TABLET NIGHTLY FOR PERSISTENT ALLERGIC SYMPTOMS 02/03/17   [provider]  oxymetazoline (AFRIN) 0.05 % nasal spray Place 1 spray into both nostrils daily as needed for congestion.    [provider]  pregabalin (LYRICA) 75 MG capsule Take 1 capsule (75 mg total) by mouth at bedtime. Patient not taking: Reported on 10/22/2017 07/07/17   Holley Bouche, NP  sucralfate (CARAFATE) 1 GM/10ML suspension Take 10 mLs (1 g total) by mouth 4 (four) times daily. Patient not taking: Reported on 10/22/2017 12/21/16   Carlis Stable, NP    Allergies as of 10/22/2017 - Review Complete 10/22/2017  Allergen Reaction Noted  . Codeine Rash 08/25/2011    Family History  Problem Relation Age of Onset  .  Other Mother        clot that went to heart, deceased age 91ss  . Heart attack Father        age 36s, deceased  . Stroke Father   . Cancer Father        "at death determined he was ate up with cancer"  . Hypertension Sister   . Kidney cancer Sister 9  . Diabetes Brother   . Hypertension Brother   . Endometriosis Daughter   . Heart attack Maternal Grandfather   . Diabetes Sister   . Brain cancer Paternal Aunt   . Cancer Paternal Uncle        NOS  . Cancer Paternal Uncle        NOS  . Cancer Paternal Uncle        NOS  . Colon cancer Neg Hx     Social History   Socioeconomic History  . Marital status: Married    Spouse name: Not on file  . Number of children: 2  . Years of education: Not on file  . Highest education level: Not on file  Social Needs  . Financial resource strain: Not on file  . Food  insecurity - worry: Not on file  . Food insecurity - inability: Not on file  . Transportation needs - medical: Not on file  . Transportation needs - non-medical: Not on file  Occupational History  . Not on file  Tobacco Use  . Smoking status: Current Every Day Smoker    Packs/day: 0.25    Years: 25.00    Pack years: 6.25    Types: Cigarettes  . Smokeless tobacco: Never Used  . Tobacco comment: Is trying to quit/wean off.  Substance and Sexual Activity  . Alcohol use: No  . Drug use: No  . Sexual activity: Not Currently    Birth control/protection: Surgical    Comment: hyst  Other Topics Concern  . Not on file  Social History Narrative  . Not on file    Review of Systems: See HPI, otherwise negative ROS  Physical Exam: BP (!) 181/74   Pulse 70   Temp (!) 97.2 F (36.2 C) (Oral)   Ht 5\' 3"  (1.6 m)   Wt 163 lb 3.2 oz (74 kg)   BMI 28.91 kg/m  General:   Alert,  pleasant and cooperative in NAD Neck:  Supple; no masses or thyromegaly. No significant cervical adenopathy. Lungs:  Clear throughout to auscultation.   No wheezes, crackles, or rhonchi. No acute distress. Heart:  Regular rate and rhythm; no murmurs, clicks, rubs,  or gallops. Abdomen: Non-distended, normal bowel sounds.  Soft and nontender without appreciable mass or hepatosplenomegaly.  Pulses:  Normal pulses noted. Extremities:  Without clubbing or edema.  Impression:  Pleasant 57 year old lady with poorly controlled GERD,  recurrent esophageal dysphagia. This is in the setting of a history of adenocarcinoma of the colon, stage IV uterine cancer and lynch syndrome.  We talked about further evaluation of dysphagia via a barium pill esophagram versus repeat EGD.  Given Lynch syndrome, recommended we pursue an EGD to reassess her upper GI tract and perform dilation is appropriate  She will need a repeat colonoscopy a year from now.  She will be transitioning all of her medical care to [facility] later in the  year  Recommendations:   I have offered the patient a EGD with esophageal dilation as feasible/appropriate per plan. EGD with esophageal dilation. GERD and dysphagia. Propofol. The risks, benefits,  limitations, alternatives and imponderables have been reviewed with the patient. Potential for esophageal dilation, biopsy, etc. have also been reviewed.  Questions have been answered. All parties agreeable.  Continue Dexilant 60 mg   Continue Linzess - take every day  Further recommendations to follow          Notice: This dictation was prepared with Dragon dictation along with smaller phrase technology. Any transcriptional errors that result from this process are unintentional and may not be corrected upon review.

## 2017-10-22 NOTE — H&P (View-Only) (Signed)
Primary Care Physician:  System, Pcp Not In Primary Gastroenterologist:  Dr. Gala Romney  Pre-Procedure History & Physical: HPI:  Julia Osborne is a 57 y.o. female here for for recurrent dysphagia, history of GERD, history of adenocarcinoma the colon - status post right hemicolectomy. History of stage IV uterine cancer and recently diagnosed Lynch syndrome. Patient is not having any bowel issues other than chronic constipation managed with Linzess. Residual Colon and rectum appear normal on colonoscopy less than a year ago. She will be due for high risk surveillance a year from now.  Recurrent esophageal dysphagia. She responded nicely to esophageal dilation in May of last year but improvement was only short lived. She had some focal gastritis (biopsy-proven). Says reflux symptoms suboptimally controlled on Dexilant 60 mg daily.  She now lives in [place] and is planning to have all of her care transitioned to [facility]. She is looking for a gastroenterologist down there.  Past Medical History:  Diagnosis Date  . Adenocarcinoma of colon ([place]) 09/13/2015  . Anemia   . Anxiety   . Asthma   . Cancer ([place])    endometrial; cancer cells in intestine  . COPD (chronic obstructive pulmonary disease) ([place])    no definite diagnosis  . Depression   . Diverticulitis   . Dyspareunia 05/16/2015  . Family history of cancer   . Family history of kidney cancer   . GERD (gastroesophageal reflux disease)   . Indigestion   . Lynch syndrome   . Neuropathy    feet and hands  . Uterine fibroid   . Vaginal dryness 05/16/2015  . Vaginal itching 07/16/2015  . Vaginal Pap smear, abnormal     Past Surgical History:  Procedure Laterality Date  . ABDOMINAL HYSTERECTOMY    . APPENDECTOMY    . BIOPSY N/A 03/14/2015   Procedure: BIOPSY;  Surgeon: Daneil Dolin, MD;  Location: [facility];  Service: Endoscopy;  Laterality: N/A;  Gastric  . COLONOSCOPY N/A 01/28/2017   Procedure:  COLONOSCOPY;  Surgeon: Daneil Dolin, MD;  Location: [facility];  Service: Endoscopy;  Laterality: N/A;  11:30am  . COLONOSCOPY WITH PROPOFOL N/A 03/14/2015   RMR: Internal hemorrhoids. colonic diverticulosis. Incomplete examination. Prepartation inadequate.  . COLONOSCOPY WITH PROPOFOL N/A 07/04/2015   RMR: Colonic diverticulosis . Large polypoid lesion in the vicinity of the hepatic flexure status post saline-assisted piecmeal snare polypectomy  with ablation and tattooing as described. Sigmoid polyp removed as described above. sigmoid colon polyp hyperplastic, hepatic flexure polyp with TA with focal high grade dysplasia   . ESOPHAGEAL DILATION N/A 03/14/2015   Procedure: ESOPHAGEAL DILATION;  Surgeon: Daneil Dolin, MD;  Location: [facility];  Service: Endoscopy;  Laterality: N/A;  Maloney 75  . ESOPHAGOGASTRODUODENOSCOPY N/A 05/19/2016   Procedure: ESOPHAGOGASTRODUODENOSCOPY (EGD);  Surgeon: Daneil Dolin, MD;  [facility];  Service: Endoscopy;  Laterality: N/A;  215  . ESOPHAGOGASTRODUODENOSCOPY N/A 01/28/2017   Procedure: ESOPHAGOGASTRODUODENOSCOPY (EGD);  Surgeon: Daneil Dolin, MD;  [facility];  Service: Endoscopy;  Laterality: N/A;  . ESOPHAGOGASTRODUODENOSCOPY (EGD) WITH PROPOFOL N/A 03/14/2015   RMR: Mild erosive reflux esophagitis status post passage o f a Maloney dilator. Abnormal gastric mucosa of uncertain significance as described above. status post biopsy, benign  . MALONEY DILATION N/A 05/19/2016   Procedure: [name] DILATION;  Surgeon: Daneil Dolin, MD;  [facility]: [facility];  Service: Endoscopy;  Laterality: N/A;  . MALONEY DILATION N/A 01/28/2017  Procedure: MALONEY DILATION;  Surgeon: Daneil Dolin, MD;  [facility]: [facility];  Service: Endoscopy;  Laterality: N/A;  . PARTIAL COLECTOMY  08/30/2015   polyp with adenocarcinoma  . POLYPECTOMY N/A 07/04/2015   Procedure: POLYPECTOMY;  Surgeon: Daneil Dolin, MD;  Location: [facility];  Service:  Endoscopy;  Laterality: N/A;  . PORTACATH PLACEMENT Right 09/2014  . TUBAL LIGATION      Prior to Admission medications   Medication Sig Start Date End Date Taking? Authorizing Provider  albuterol (PROVENTIL HFA;VENTOLIN HFA) 108 (90 Base) MCG/ACT inhaler Inhale 2 puffs into the lungs every 2 (two) hours as needed for wheezing or shortness of breath (cough). 10/17/15  Yes Annitta Needs, NP  albuterol (PROVENTIL) (2.5 MG/3ML) 0.083% nebulizer solution Take 3 mLs (2.5 mg total) by nebulization every 6 (six) hours as needed for wheezing or shortness of breath. Patient taking differently: Take 2.5 mg by nebulization daily as needed for wheezing or shortness of breath.  01/16/16  Yes Penland, Kelby Fam, MD  ALPRAZolam Duanne Moron) 0.25 MG tablet Take 0.125 mg by mouth as needed for anxiety.    Yes [provider]  citalopram (CELEXA) 40 MG tablet Take 40 mg by mouth at bedtime. 01/05/17  Yes [provider]  DEXILANT 60 MG capsule TAKE ONE CAPSULE BY MOUTH DAILY 02/23/17  Yes Annitta Needs, NP  linaclotide [facility]) 145 MCG CAPS capsule Take 1 capsule (145 mcg total) by mouth daily before breakfast. 12/21/16  Yes Carlis Stable, NP  Naphazoline-Pheniramine (ALLERGY EYE OP) Place 1-2 drops into both eyes 2 (two) times daily as needed (ITCHY, WATERY EYES).   Yes [provider]  ondansetron (ZOFRAN) 8 MG tablet Take 8 mg by mouth daily as needed (CAR SICKNESS).   Yes [provider]  phenazopyridine (PYRIDIUM) 200 MG tablet Take 1 tablet (200 mg total) by mouth 3 (three) times daily as needed for pain. 09/04/17 09/04/18 Yes Earleen Newport, MD  traMADol (ULTRAM) 50 MG tablet TAKE 1 TABLET BY MOUTH EVERY 6 HOURS AS NEEDED 07/23/17  Yes Kefalas, Manon Hilding, PA-C  traZODone (DESYREL) 50 MG tablet Take 50 mg by mouth at bedtime.   Yes [provider]  amoxicillin (AMOXIL) 875 MG tablet Take 1 tablet (875 mg total) by mouth 2 (two) times daily. Patient not taking: Reported on  10/22/2017 09/04/17   Earleen Newport, MD  diazepam (VALIUM) 5 MG tablet Take 1 tablet (5 mg total) by mouth every 8 (eight) hours as needed for muscle spasms. Patient not taking: Reported on 10/22/2017 09/04/17   Earleen Newport, MD  loratadine (CLARITIN) 10 MG tablet Take 10 mg by mouth daily.    [provider]  montelukast (SINGULAIR) 10 MG tablet TAKE 1 TABLET NIGHTLY FOR PERSISTENT ALLERGIC SYMPTOMS 02/03/17   [provider]  oxymetazoline (AFRIN) 0.05 % nasal spray Place 1 spray into both nostrils daily as needed for congestion.    [provider]  pregabalin (LYRICA) 75 MG capsule Take 1 capsule (75 mg total) by mouth at bedtime. Patient not taking: Reported on 10/22/2017 07/07/17   Holley Bouche, NP  sucralfate (CARAFATE) 1 GM/10ML suspension Take 10 mLs (1 g total) by mouth 4 (four) times daily. Patient not taking: Reported on 10/22/2017 12/21/16   Carlis Stable, NP    Allergies as of 10/22/2017 - Review Complete 10/22/2017  Allergen Reaction Noted  . Codeine Rash 08/25/2011    Family History  Problem Relation Age of Onset  .  Other Mother        clot that went to heart, deceased age 66ss  . Heart attack Father        age 33s, deceased  . Stroke Father   . Cancer Father        "at death determined he was ate up with cancer"  . Hypertension Sister   . Kidney cancer Sister 74  . Diabetes Brother   . Hypertension Brother   . Endometriosis Daughter   . Heart attack Maternal Grandfather   . Diabetes Sister   . Brain cancer Paternal Aunt   . Cancer Paternal Uncle        NOS  . Cancer Paternal Uncle        NOS  . Cancer Paternal Uncle        NOS  . Colon cancer Neg Hx     Social History   Socioeconomic History  . Marital status: Married    Spouse name: Not on file  . Number of children: 2  . Years of education: Not on file  . Highest education level: Not on file  Social Needs  . Financial resource strain: Not on file  . Food  insecurity - worry: Not on file  . Food insecurity - inability: Not on file  . Transportation needs - medical: Not on file  . Transportation needs - non-medical: Not on file  Occupational History  . Not on file  Tobacco Use  . Smoking status: Current Every Day Smoker    Packs/day: 0.25    Years: 25.00    Pack years: 6.25    Types: Cigarettes  . Smokeless tobacco: Never Used  . Tobacco comment: Is trying to quit/wean off.  Substance and Sexual Activity  . Alcohol use: No  . Drug use: No  . Sexual activity: Not Currently    Birth control/protection: Surgical    Comment: hyst  Other Topics Concern  . Not on file  Social History Narrative  . Not on file    Review of Systems: See HPI, otherwise negative ROS  Physical Exam: BP (!) 181/74   Pulse 70   Temp (!) 97.2 F (36.2 C) (Oral)   Ht 5\' 3"  (1.6 m)   Wt 163 lb 3.2 oz (74 kg)   BMI 28.91 kg/m  General:   Alert,  pleasant and cooperative in NAD Neck:  Supple; no masses or thyromegaly. No significant cervical adenopathy. Lungs:  Clear throughout to auscultation.   No wheezes, crackles, or rhonchi. No acute distress. Heart:  Regular rate and rhythm; no murmurs, clicks, rubs,  or gallops. Abdomen: Non-distended, normal bowel sounds.  Soft and nontender without appreciable mass or hepatosplenomegaly.  Pulses:  Normal pulses noted. Extremities:  Without clubbing or edema.  Impression:  Pleasant 57 year old lady with poorly controlled GERD,  recurrent esophageal dysphagia. This is in the setting of a history of adenocarcinoma of the colon, stage IV uterine cancer and lynch syndrome.  We talked about further evaluation of dysphagia via a barium pill esophagram versus repeat EGD.  Given Lynch syndrome, recommended we pursue an EGD to reassess her upper GI tract and perform dilation is appropriate  She will need a repeat colonoscopy a year from now.  She will be transitioning all of her medical care to [facility] later in the  year  Recommendations:   I have offered the patient a EGD with esophageal dilation as feasible/appropriate per plan. EGD with esophageal dilation. GERD and dysphagia. Propofol. The risks, benefits,  limitations, alternatives and imponderables have been reviewed with the patient. Potential for esophageal dilation, biopsy, etc. have also been reviewed.  Questions have been answered. All parties agreeable.  Continue Dexilant 60 mg   Continue Linzess - take every day  Further recommendations to follow          Notice: This dictation was prepared with Dragon dictation along with smaller phrase technology. Any transcriptional errors that result from this process are unintentional and may not be corrected upon review.

## 2017-10-23 ENCOUNTER — Other Ambulatory Visit (HOSPITAL_COMMUNITY): Payer: Self-pay | Admitting: Adult Health

## 2017-10-23 DIAGNOSIS — R202 Paresthesia of skin: Secondary | ICD-10-CM

## 2017-10-23 DIAGNOSIS — C189 Malignant neoplasm of colon, unspecified: Secondary | ICD-10-CM

## 2017-10-23 DIAGNOSIS — T451X5A Adverse effect of antineoplastic and immunosuppressive drugs, initial encounter: Principal | ICD-10-CM

## 2017-10-23 DIAGNOSIS — G620 Drug-induced polyneuropathy: Secondary | ICD-10-CM

## 2017-10-23 DIAGNOSIS — C541 Malignant neoplasm of endometrium: Secondary | ICD-10-CM

## 2017-10-25 ENCOUNTER — Telehealth: Payer: Self-pay

## 2017-10-25 NOTE — Telephone Encounter (Signed)
Called and informed pt of pre-op appt 11/12/17 at 11:00am. Letter mailed.

## 2017-11-12 ENCOUNTER — Encounter (HOSPITAL_COMMUNITY)
Admission: RE | Admit: 2017-11-12 | Discharge: 2017-11-12 | Disposition: A | Discharge status: Home / Self Care | Payer: Medicare Other | Source: Ambulatory Visit | Attending: Internal Medicine | Admitting: Internal Medicine

## 2017-11-12 ENCOUNTER — Encounter (HOSPITAL_COMMUNITY): Payer: Self-pay

## 2017-11-18 ENCOUNTER — Ambulatory Visit (HOSPITAL_COMMUNITY)
Admission: RE | Admit: 2017-11-18 | Discharge: 2017-11-18 | Disposition: A | Discharge status: Home / Self Care | Payer: Medicare Other | Source: Ambulatory Visit | Attending: Internal Medicine | Admitting: Internal Medicine

## 2017-11-18 ENCOUNTER — Encounter (HOSPITAL_COMMUNITY)
Admission: RE | Disposition: A | Discharge status: Home / Self Care | Payer: Self-pay | Source: Ambulatory Visit | Attending: Internal Medicine

## 2017-11-18 ENCOUNTER — Ambulatory Visit (HOSPITAL_COMMUNITY): Payer: Medicare Other | Admitting: Anesthesiology

## 2017-11-18 ENCOUNTER — Encounter (HOSPITAL_COMMUNITY): Payer: Self-pay | Admitting: *Deleted

## 2017-11-18 ENCOUNTER — Other Ambulatory Visit: Payer: Self-pay

## 2017-11-18 DIAGNOSIS — Z885 Allergy status to narcotic agent status: Secondary | ICD-10-CM | POA: Diagnosis not present

## 2017-11-18 DIAGNOSIS — F17210 Nicotine dependence, cigarettes, uncomplicated: Secondary | ICD-10-CM | POA: Diagnosis not present

## 2017-11-18 DIAGNOSIS — R1314 Dysphagia, pharyngoesophageal phase: Secondary | ICD-10-CM | POA: Diagnosis not present

## 2017-11-18 DIAGNOSIS — K5909 Other constipation: Secondary | ICD-10-CM | POA: Diagnosis not present

## 2017-11-18 DIAGNOSIS — K219 Gastro-esophageal reflux disease without esophagitis: Secondary | ICD-10-CM | POA: Insufficient documentation

## 2017-11-18 DIAGNOSIS — Z85038 Personal history of other malignant neoplasm of large intestine: Secondary | ICD-10-CM | POA: Diagnosis not present

## 2017-11-18 DIAGNOSIS — R1310 Dysphagia, unspecified: Secondary | ICD-10-CM

## 2017-11-18 DIAGNOSIS — F419 Anxiety disorder, unspecified: Secondary | ICD-10-CM | POA: Diagnosis not present

## 2017-11-18 DIAGNOSIS — K2100 Gastro-esophageal reflux disease with esophagitis, without bleeding: Secondary | ICD-10-CM

## 2017-11-18 DIAGNOSIS — F329 Major depressive disorder, single episode, unspecified: Secondary | ICD-10-CM | POA: Diagnosis not present

## 2017-11-18 DIAGNOSIS — K449 Diaphragmatic hernia without obstruction or gangrene: Secondary | ICD-10-CM | POA: Diagnosis not present

## 2017-11-18 DIAGNOSIS — Z1509 Genetic susceptibility to other malignant neoplasm: Secondary | ICD-10-CM | POA: Insufficient documentation

## 2017-11-18 DIAGNOSIS — Z8249 Family history of ischemic heart disease and other diseases of the circulatory system: Secondary | ICD-10-CM | POA: Insufficient documentation

## 2017-11-18 DIAGNOSIS — Z79899 Other long term (current) drug therapy: Secondary | ICD-10-CM | POA: Diagnosis not present

## 2017-11-18 DIAGNOSIS — Z8051 Family history of malignant neoplasm of kidney: Secondary | ICD-10-CM | POA: Insufficient documentation

## 2017-11-18 DIAGNOSIS — Z8542 Personal history of malignant neoplasm of other parts of uterus: Secondary | ICD-10-CM | POA: Insufficient documentation

## 2017-11-18 DIAGNOSIS — J449 Chronic obstructive pulmonary disease, unspecified: Secondary | ICD-10-CM | POA: Diagnosis not present

## 2017-11-18 HISTORY — PX: ESOPHAGOGASTRODUODENOSCOPY (EGD) WITH PROPOFOL: SHX5813

## 2017-11-18 HISTORY — PX: MALONEY DILATION: SHX5535

## 2017-11-18 SURGERY — ESOPHAGOGASTRODUODENOSCOPY (EGD) WITH PROPOFOL
Anesthesia: Monitor Anesthesia Care

## 2017-11-18 MED ORDER — LIDOCAINE VISCOUS 2 % MT SOLN
OROMUCOSAL | Status: AC
  Filled 2017-11-18: qty 15

## 2017-11-18 MED ORDER — IPRATROPIUM-ALBUTEROL 0.5-2.5 (3) MG/3ML IN SOLN
RESPIRATORY_TRACT | Status: AC
  Filled 2017-11-18: qty 3

## 2017-11-18 MED ORDER — SODIUM CHLORIDE 0.9% FLUSH
INTRAVENOUS | Status: AC
  Filled 2017-11-18: qty 10

## 2017-11-18 MED ORDER — PROPOFOL 10 MG/ML IV BOLUS
INTRAVENOUS | Status: AC
  Filled 2017-11-18: qty 60

## 2017-11-18 MED ORDER — ONDANSETRON HCL 4 MG/2ML IJ SOLN
INTRAMUSCULAR | Status: AC
  Filled 2017-11-18: qty 2

## 2017-11-18 MED ORDER — GLYCOPYRROLATE 0.2 MG/ML IJ SOLN
0.2000 mg | Freq: Once | INTRAMUSCULAR | Status: AC
  Administered 2017-11-18: 0.2 mg via INTRAVENOUS

## 2017-11-18 MED ORDER — ONDANSETRON HCL 4 MG/2ML IJ SOLN
4.0000 mg | Freq: Once | INTRAMUSCULAR | Status: AC
  Administered 2017-11-18: 4 mg via INTRAVENOUS

## 2017-11-18 MED ORDER — PROMETHAZINE HCL 6.25 MG/5ML PO SYRP: 6.2500 mg | ORAL_SOLUTION | Freq: Four times a day (QID) | ORAL | Status: DC | PRN

## 2017-11-18 MED ORDER — PROMETHAZINE HCL 25 MG/ML IJ SOLN
INTRAMUSCULAR | Status: AC
  Filled 2017-11-18: qty 1

## 2017-11-18 MED ORDER — GLYCOPYRROLATE 0.2 MG/ML IJ SOLN
INTRAMUSCULAR | Status: AC
  Filled 2017-11-18: qty 1

## 2017-11-18 MED ORDER — IPRATROPIUM-ALBUTEROL 0.5-2.5 (3) MG/3ML IN SOLN
3.0000 mL | Freq: Once | RESPIRATORY_TRACT | Status: AC
  Administered 2017-11-18: 3 mL via RESPIRATORY_TRACT

## 2017-11-18 MED ORDER — PROPOFOL 500 MG/50ML IV EMUL
INTRAVENOUS | Status: DC | PRN
  Administered 2017-11-18: 75 ug/kg/min via INTRAVENOUS

## 2017-11-18 MED ORDER — LACTATED RINGERS IV SOLN
INTRAVENOUS | Status: DC
  Administered 2017-11-18: 10:00:00 via INTRAVENOUS

## 2017-11-18 MED ORDER — FENTANYL CITRATE (PF) 100 MCG/2ML IJ SOLN
25.0000 ug | Freq: Once | INTRAMUSCULAR | Status: AC
  Administered 2017-11-18: 25 ug via INTRAVENOUS

## 2017-11-18 MED ORDER — MIDAZOLAM HCL 2 MG/2ML IJ SOLN
INTRAMUSCULAR | Status: AC
  Filled 2017-11-18: qty 2

## 2017-11-18 MED ORDER — PROPOFOL 10 MG/ML IV BOLUS
INTRAVENOUS | Status: DC | PRN
  Administered 2017-11-18 (×7): 20 mg via INTRAVENOUS

## 2017-11-18 MED ORDER — CHLORHEXIDINE GLUCONATE CLOTH 2 % EX PADS: 6.0000 | MEDICATED_PAD | Freq: Once | CUTANEOUS | Status: DC

## 2017-11-18 MED ORDER — MIDAZOLAM HCL 2 MG/2ML IJ SOLN
1.0000 mg | INTRAMUSCULAR | Status: AC
  Administered 2017-11-18: 2 mg via INTRAVENOUS

## 2017-11-18 MED ORDER — PROMETHAZINE HCL 25 MG/ML IJ SOLN
6.2500 mg | INTRAMUSCULAR | Status: DC | PRN
  Administered 2017-11-18: 6.25 mg via INTRAVENOUS

## 2017-11-18 MED ORDER — FENTANYL CITRATE (PF) 100 MCG/2ML IJ SOLN
INTRAMUSCULAR | Status: AC
  Filled 2017-11-18: qty 2

## 2017-11-18 NOTE — Anesthesia Procedure Notes (Signed)
Procedure Name: MAC Date/Time: 11/18/2017 10:56 AM Performed by: Vista Deck, CRNA Pre-anesthesia Checklist: Patient identified, Emergency Drugs available, Suction available, Timeout performed and Patient being monitored Patient Re-evaluated:Patient Re-evaluated prior to induction Oxygen Delivery Method: Non-rebreather mask

## 2017-11-18 NOTE — Discharge Instructions (Signed)
EGD Discharge instructions Please read the instructions outlined below and refer to this sheet in the next few weeks. These discharge instructions provide you with general information on caring for yourself after you leave the hospital. Your doctor may also give you specific instructions. While your treatment has been planned according to the most current medical practices available, unavoidable complications occasionally occur. If you have any problems or questions after discharge, please call your doctor. ACTIVITY  You may resume your regular activity but move at a slower pace for the next 24 hours.   Take frequent rest periods for the next 24 hours.   Walking will help expel (get rid of) the air and reduce the bloated feeling in your abdomen.   No driving for 24 hours (because of the anesthesia (medicine) used during the test).   You may shower.   Do not sign any important legal documents or operate any machinery for 24 hours (because of the anesthesia used during the test).  NUTRITION  Drink plenty of fluids.   You may resume your normal diet.   Begin with a light meal and progress to your normal diet.   Avoid alcoholic beverages for 24 hours or as instructed by your caregiver.  MEDICATIONS  You may resume your normal medications unless your caregiver tells you otherwise.  WHAT YOU CAN EXPECT TODAY  You may experience abdominal discomfort such as a feeling of fullness or gas pains.  FOLLOW-UP  Your doctor will discuss the results of your test with you.  SEEK IMMEDIATE MEDICAL ATTENTION IF ANY OF THE FOLLOWING OCCUR:  Excessive nausea (feeling sick to your stomach) and/or vomiting.   Severe abdominal pain and distention (swelling).   Trouble swallowing.   Temperature over 101 F (37.8 C).   Rectal bleeding or vomiting of blood.    When you establish care with your new gastroenterologist, I'll be happy to to send your records.    PATIENT  INSTRUCTIONS POST-ANESTHESIA  IMMEDIATELY FOLLOWING SURGERY:  Do not drive or operate machinery for the first twenty four hours after surgery.  Do not make any important decisions for twenty four hours after surgery or while taking narcotic pain medications or sedatives.  If you develop intractable nausea and vomiting or a severe headache please notify your doctor immediately.  FOLLOW-UP:  Please make an appointment with your surgeon as instructed. You do not need to follow up with anesthesia unless specifically instructed to do so.  WOUND CARE INSTRUCTIONS (if applicable):  Keep a dry clean dressing on the anesthesia/puncture wound site if there is drainage.  Once the wound has quit draining you may leave it open to air.  Generally you should leave the bandage intact for twenty four hours unless there is drainage.  If the epidural site drains for more than 36-48 hours please call the anesthesia department.  QUESTIONS?:  Please feel free to call your physician or the hospital operator if you have any questions, and they will be happy to assist you.

## 2017-11-18 NOTE — Interval H&P Note (Signed)
History and Physical Interval Note:  11/18/2017 10:44 AM  Julia Osborne  has presented today for surgery, with the diagnosis of GERD, dysphagia  The various methods of treatment have been discussed with the patient and family. After consideration of risks, benefits and other options for treatment, the patient has consented to  Procedure(s) with comments: ESOPHAGOGASTRODUODENOSCOPY (EGD) WITH PROPOFOL (N/A) - 12:15pm MALONEY DILATION (N/A) as a surgical intervention .  The patient's history has been reviewed, patient examined, no change in status, stable for surgery.  I have reviewed the patient's chart and labs.  Questions were answered to the patient's satisfaction.     Julia Osborne  No change. EGD with esophageal dilation as feasible/appropriate per plan.  The risks, benefits, limitations, alternatives and imponderables have been reviewed with the patient. Potential for esophageal dilation, biopsy, etc. have also been reviewed.  Questions have been answered. All parties agreeable.

## 2017-11-18 NOTE — Op Note (Signed)
[facility] Patient Name: Julia Osborne Procedure Date: 11/18/2017 10:33 AM MRN: [id number] Date of Birth: [birth year] Attending MD: Norvel Richards , MD CSN: [id number] Age: 57 Admit Type: Outpatient Procedure:                Upper GI endoscopy Indications:              Dysphagia Providers:                Norvel Richards, MD, Janeece Riggers, RN, Nelma Rothman, Technician Referring MD:              Medicines:                Propofol per Anesthesia Complications:            No immediate complications. Estimated Blood Loss:     Estimated blood loss was minimal. Procedure:                Pre-Anesthesia Assessment:                           - Prior to the procedure, a History and Physical                            was performed, and patient medications and                            allergies were reviewed. The patient's tolerance of                            previous anesthesia was also reviewed. The risks                            and benefits of the procedure and the sedation                            options and risks were discussed with the patient.                            All questions were answered, and informed consent                            was obtained. Prior Anticoagulants: The patient has                            taken no previous anticoagulant or antiplatelet                            agents. ASA Grade Assessment: III - A patient with                            severe systemic disease. After reviewing the risks  and benefits, the patient was deemed in                            satisfactory condition to undergo the procedure.                           After obtaining informed consent, the endoscope was                            passed under direct vision. Throughout the                            procedure, the patient's blood pressure, pulse, and                            oxygen saturations were  monitored continuously. The                            EG-[id number] ([id number]) scope was introduced through the                            and advanced to the second part of duodenum. The                            EG-[id number] ([id number]) scope was introduced through the                            and advanced to the. The upper GI endoscopy was                            accomplished without difficulty. The patient                            tolerated the procedure well. Scope In: 11:04:54 AM Scope Out: 11:20:47 AM Total Procedure Duration: 0 hours 15 minutes 53 seconds  Findings:      The examined esophagus was normal.      A small hiatal hernia was present. Single tiny erosion on the greater       curvature otherwise normal-appearing stomach. Single tiny erosion in the       posterior bulb. Otherwise D1 and D2 appeared normal. In particular, I       got a good look at the ampulla with the gastroscope and it also appeared       normal.      The duodenal bulb and second portion of the duodenum were normal. The       scope was withdrawn. Dilation was performed with a Maloney dilator with       mild resistance at 58 Fr. The scope was withdrawn. Dilation was       performed with a Maloney dilator with mild resistance at 56 Fr. The       dilation site was examined following endoscope reinsertion and showed no       change. Estimated blood loss was minimal. Impression:               - Normal esophagus. Status post Julia Osborne  dilation.                           - Small hiatal hernia. Trivial gastric and duodenal                            erosions.                           - Normal duodenal bulb and second portion of the                            duodenum.                           - No specimens collected. Moderate Sedation:      Moderate (conscious) sedation was personally administered by an       anesthesia professional. The following parameters were monitored: oxygen       saturation, heart rate,  blood pressure, respiratory rate, EKG, adequacy       of pulmonary ventilation, and response to care. Total physician       intraservice time was 24 minutes. Recommendation:           - Patient has a contact number available for                            emergencies. The signs and symptoms of potential                            delayed complications were discussed with the                            patient. Return to normal activities tomorrow.                            Written discharge instructions were provided to the                            patient.                           - Resume previous diet.                           - Continue present medications.                           - Repeat upper endoscopy (date not yet determined)                            for surveillance.                           - Will send records to her new gastroenterologist                            once she relocates..                           -  Patient has a contact number available for                            emergencies. The signs and symptoms of potential                            delayed complications were discussed with the                            patient. Return to normal activities tomorrow.                            Written discharge instructions were provided to the                            patient.                           - Resume previous diet.                           - Continue present medications. Procedure Code(s):        --- Professional ---                           [id number], Esophagogastroduodenoscopy, flexible,                            transoral; diagnostic, including collection of                            specimen(s) by brushing or washing, when performed                            (separate procedure)                           43450, Dilation of esophagus, by unguided sound or                            bougie, single or multiple passes Diagnosis Code(s):         --- Professional ---                           K44.9, Diaphragmatic hernia without obstruction or                            gangrene                           R13.10, Dysphagia, unspecified CPT copyright 2016 [employer]. All rights reserved. The codes documented in this report are preliminary and upon coder review may  be revised to meet current compliance requirements. Cristopher Estimable. Prima Rayner, MD Norvel Richards, MD 11/18/2017 11:30:30 AM This report has been signed electronically. Number of Addenda: 0

## 2017-11-18 NOTE — Transfer of Care (Signed)
Immediate Anesthesia Transfer of Care Note  Patient: [name]  Procedure(s) Performed: ESOPHAGOGASTRODUODENOSCOPY (EGD) WITH PROPOFOL (N/A ) MALONEY DILATION (N/A )  Patient Location: PACU  Anesthesia Type:MAC  Level of Consciousness: awake, alert  and patient cooperative  Airway & Oxygen Therapy: Patient Spontanous Breathing  Post-op Assessment: Report given to RN and Post -op Vital signs reviewed and stable  Post vital signs: Reviewed and stable  Last Vitals:  Vitals:   11/18/17 1045 11/18/17 1050  BP: 131/87   Pulse:    Resp: (!) 35 10  Temp:    SpO2: 98% 96%    Last Pain:  Vitals:   11/18/17 0918  TempSrc: Oral         Complications: No apparent anesthesia complications

## 2017-11-18 NOTE — Anesthesia Preprocedure Evaluation (Addendum)
Anesthesia Evaluation  Patient identified by MRN, date of birth, ID band Patient awake    Reviewed: Allergy & Precautions, NPO status , Patient's Chart, lab work & pertinent test results  Airway Mallampati: I  TM Distance: >3 FB Neck ROM: Full    Dental  (+) Poor Dentition, Chipped, Dental Advisory Given   Pulmonary asthma , COPD,  COPD inhaler, Current Smoker,    breath sounds clear to auscultation       Cardiovascular negative cardio ROS   Rhythm:Regular Rate:Normal     Neuro/Psych PSYCHIATRIC DISORDERS Anxiety Depression  Neuromuscular disease    GI/Hepatic Hiatal hernia: dysphagia., GERD  Medicated,  Endo/Other    Renal/GU      Musculoskeletal   Abdominal   Peds  Hematology  (+) anemia ,   Anesthesia Other Findings   Reproductive/Obstetrics                            Anesthesia Physical Anesthesia Plan  ASA: II  Anesthesia Plan: MAC   Post-op Pain Management:    Induction: Intravenous  PONV Risk Score and Plan:   Airway Management Planned: Simple Face Mask  Additional Equipment:   Intra-op Plan:   Post-operative Plan:   Informed Consent: I have reviewed the patients History and Physical, chart, labs and discussed the procedure including the risks, benefits and alternatives for the proposed anesthesia with the patient or authorized representative who has indicated his/her understanding and acceptance.     Plan Discussed with:   Anesthesia Plan Comments:         Anesthesia Quick Evaluation

## 2017-11-18 NOTE — Anesthesia Postprocedure Evaluation (Signed)
Anesthesia Post Note  Patient: Julia Osborne  Procedure(s) Performed: ESOPHAGOGASTRODUODENOSCOPY (EGD) WITH PROPOFOL (N/A ) MALONEY DILATION (N/A )  Patient location during evaluation: PACU Anesthesia Type: MAC Level of consciousness: awake and alert and patient cooperative Pain management: satisfactory to patient Vital Signs Assessment: post-procedure vital signs reviewed and stable Respiratory status: spontaneous breathing Cardiovascular status: stable Postop Assessment: no apparent nausea or vomiting Anesthetic complications: no     Last Vitals:  Vitals:   11/18/17 1050 11/18/17 1130  BP:  (!) 113/95  Pulse:  72  Resp: 10 (!) 21  Temp:  37 C  SpO2: 96% 97%    Last Pain:  Vitals:   11/18/17 0918  TempSrc: Oral                 Savaya Hakes

## 2017-11-22 ENCOUNTER — Encounter (HOSPITAL_COMMUNITY): Payer: Self-pay | Admitting: Internal Medicine

## 2017-12-22 ENCOUNTER — Other Ambulatory Visit: Payer: Self-pay | Admitting: Nurse Practitioner

## 2017-12-22 DIAGNOSIS — H401110 Primary open-angle glaucoma, right eye, stage unspecified: Secondary | ICD-10-CM | POA: Diagnosis not present

## 2017-12-22 DIAGNOSIS — H40022 Open angle with borderline findings, high risk, left eye: Secondary | ICD-10-CM | POA: Diagnosis not present

## 2017-12-22 DIAGNOSIS — H2513 Age-related nuclear cataract, bilateral: Secondary | ICD-10-CM | POA: Diagnosis not present

## 2017-12-27 ENCOUNTER — Emergency Department
Admission: EM | Admit: 2017-12-27 | Discharge: 2017-12-27 | Disposition: A | Discharge status: Home / Self Care | Payer: Medicare Other | Attending: Emergency Medicine | Admitting: Emergency Medicine

## 2017-12-27 ENCOUNTER — Other Ambulatory Visit: Payer: Self-pay

## 2017-12-27 ENCOUNTER — Encounter: Payer: Self-pay | Admitting: Emergency Medicine

## 2017-12-27 DIAGNOSIS — Z23 Encounter for immunization: Secondary | ICD-10-CM | POA: Insufficient documentation

## 2017-12-27 DIAGNOSIS — J449 Chronic obstructive pulmonary disease, unspecified: Secondary | ICD-10-CM | POA: Insufficient documentation

## 2017-12-27 DIAGNOSIS — Y939 Activity, unspecified: Secondary | ICD-10-CM | POA: Insufficient documentation

## 2017-12-27 DIAGNOSIS — F17210 Nicotine dependence, cigarettes, uncomplicated: Secondary | ICD-10-CM | POA: Diagnosis not present

## 2017-12-27 DIAGNOSIS — Z79899 Other long term (current) drug therapy: Secondary | ICD-10-CM | POA: Diagnosis not present

## 2017-12-27 DIAGNOSIS — Y92009 Unspecified place in unspecified non-institutional (private) residence as the place of occurrence of the external cause: Secondary | ICD-10-CM | POA: Insufficient documentation

## 2017-12-27 DIAGNOSIS — S61451A Open bite of right hand, initial encounter: Secondary | ICD-10-CM | POA: Diagnosis not present

## 2017-12-27 DIAGNOSIS — W540XXA Bitten by dog, initial encounter: Secondary | ICD-10-CM | POA: Diagnosis not present

## 2017-12-27 DIAGNOSIS — Y999 Unspecified external cause status: Secondary | ICD-10-CM | POA: Diagnosis not present

## 2017-12-27 MED ORDER — LIDOCAINE HCL (PF) 1 % IJ SOLN
5.0000 mL | Freq: Once | INTRAMUSCULAR | Status: AC
  Administered 2017-12-27: 5 mL
  Filled 2017-12-27: qty 5

## 2017-12-27 MED ORDER — AMOXICILLIN-POT CLAVULANATE 875-125 MG PO TABS
1.0000 | ORAL_TABLET | Freq: Once | ORAL | Status: AC
  Administered 2017-12-27: 1 via ORAL
  Filled 2017-12-27: qty 1

## 2017-12-27 MED ORDER — TETANUS-DIPHTH-ACELL PERTUSSIS 5-2.5-18.5 LF-MCG/0.5 IM SUSP
0.5000 mL | Freq: Once | INTRAMUSCULAR | Status: AC
  Administered 2017-12-27: 0.5 mL via INTRAMUSCULAR
  Filled 2017-12-27: qty 0.5

## 2017-12-27 MED ORDER — AMOXICILLIN-POT CLAVULANATE 875-125 MG PO TABS: 1.0000 | ORAL_TABLET | Freq: Two times a day (BID) | ORAL | 0 refills | Status: DC

## 2017-12-27 NOTE — ED Notes (Signed)
See triage note   Laceration noted to right hand  S/p dog bite  Bleeding controlled

## 2017-12-27 NOTE — ED Provider Notes (Signed)
[facility] Emergency Department Provider Note ____________________________________________  Time seen: 1533  I have reviewed the triage vital signs and the nursing notes.  HISTORY  Chief Complaint  Animal Bite  HPI Julia Osborne is a 57 y.o. female presents to the Ed for evaluation of a right hand lac after an accidental bite from her dogs. The dogs were fighting over a piece of food. She is unclear of her most recent tetanus booster. She denies any hand paresthesias or disability. No other injury is reported.  Dogs are healthy, well, and up-to-date on her vaccines.  Past Medical History:  Diagnosis Date  . Adenocarcinoma of colon ([place]) 09/13/2015  . Anemia   . Anxiety   . Asthma   . Cancer ([place])    endometrial; cancer cells in intestine  . COPD (chronic obstructive pulmonary disease) ([place])    no definite diagnosis  . Depression   . Diverticulitis   . Dyspareunia 05/16/2015  . Family history of cancer   . Family history of kidney cancer   . GERD (gastroesophageal reflux disease)   . Indigestion   . Lynch syndrome   . Neuropathy    feet and hands  . Uterine fibroid   . Vaginal dryness 05/16/2015  . Vaginal itching 07/16/2015  . Vaginal Pap smear, abnormal     Patient Active Problem List   Diagnosis Date Noted  . Abdominal pain 12/21/2016  . Lynch syndrome 10/31/2015  . Genetic testing 10/21/2015  . Abdominal pain, left upper quadrant 10/17/2015  . Family history of cancer   . Family history of kidney cancer   . Adenocarcinoma of colon ([place]) 09/13/2015  . Colon neoplasm 08/30/2015  . Vaginal itching 07/16/2015  . History of colonic polyps   . 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 drug ([place]) 06/28/2015  . Constipation 06/05/2015  . Abnormal CT scan, sigmoid colon 06/05/2015  . Vaginal dryness 05/16/2015  . Dyspareunia 05/16/2015  . Mucosal abnormality of stomach   . Reflux esophagitis   . Dysphagia   . Hematochezia   . Diverticulosis of  colon without hemorrhage   . Rectal bleeding 02/14/2015  . GERD (gastroesophageal reflux disease) 02/14/2015  . Esophageal dysphagia 02/14/2015  . Abdominal pain, epigastric 02/14/2015  . Nausea with vomiting 02/14/2015  . Endometrial cancer ([place]) 10/09/2014  . Conversion reaction   . Depression   . Aphasia 09/11/2014    Past Surgical History:  Procedure Laterality Date  . ABDOMINAL HYSTERECTOMY    . APPENDECTOMY    . BIOPSY N/A 03/14/2015   Procedure: BIOPSY;  Surgeon: Daneil Dolin, MD;  Location: [facility];  Service: Endoscopy;  Laterality: N/A;  Gastric  . COLONOSCOPY N/A 01/28/2017   Procedure: COLONOSCOPY;  Surgeon: Daneil Dolin, MD;  Location: [facility];  Service: Endoscopy;  Laterality: N/A;  11:30am  . COLONOSCOPY WITH PROPOFOL N/A 03/14/2015   RMR: Internal hemorrhoids. colonic diverticulosis. Incomplete examination. Prepartation inadequate.  . COLONOSCOPY WITH PROPOFOL N/A 07/04/2015   RMR: Colonic diverticulosis . Large polypoid lesion in the vicinity of the hepatic flexure status post saline-assisted piecmeal snare polypectomy  with ablation and tattooing as described. Sigmoid polyp removed as described above. sigmoid colon polyp hyperplastic, hepatic flexure polyp with TA with focal high grade dysplasia   . ESOPHAGEAL DILATION N/A 03/14/2015   Procedure: ESOPHAGEAL DILATION;  Surgeon: Daneil Dolin, MD;  Location: [facility];  Service: Endoscopy;  Laterality: N/A;  [place] 52  . ESOPHAGOGASTRODUODENOSCOPY N/A 05/19/2016   Procedure: ESOPHAGOGASTRODUODENOSCOPY (EGD);  Surgeon: Daneil Dolin, MD;  Location: [facility];  Service: Endoscopy;  Laterality: N/A;  215  . ESOPHAGOGASTRODUODENOSCOPY N/A 01/28/2017   Procedure: ESOPHAGOGASTRODUODENOSCOPY (EGD);  Surgeon: Daneil Dolin, MD;  [facility];  Service: Endoscopy;  Laterality: N/A;  . ESOPHAGOGASTRODUODENOSCOPY (EGD) WITH PROPOFOL N/A 03/14/2015   RMR: Mild erosive reflux esophagitis status post passage o f a  [place] dilator. Abnormal gastric mucosa of uncertain significance as described above. status post biopsy, benign  . ESOPHAGOGASTRODUODENOSCOPY (EGD) WITH PROPOFOL N/A 11/18/2017   Procedure: ESOPHAGOGASTRODUODENOSCOPY (EGD) WITH PROPOFOL;  Surgeon: Daneil Dolin, MD;  [facility];  Service: Endoscopy;  Laterality: N/A;  12:15pm  . MALONEY DILATION N/A 05/19/2016   Procedure: [name] DILATION;  Surgeon: Daneil Dolin, MD;  [facility];  Service: Endoscopy;  Laterality: N/A;  . [name] DILATION N/A 01/28/2017   Procedure: [name] DILATION;  Surgeon: Daneil Dolin, MD;  [facility];  Service: Endoscopy;  Laterality: N/A;  [name] DILATION N/A 11/18/2017   Procedure: [name] DILATION;  Surgeon: Daneil Dolin, MD;  [facility];  Service: Endoscopy;  Laterality: N/A;  . PARTIAL COLECTOMY  08/30/2015   polyp with adenocarcinoma  . POLYPECTOMY N/A 07/04/2015   Procedure: POLYPECTOMY;  Surgeon: Daneil Dolin, MD;  Location: [facility];  Service: Endoscopy;  Laterality: N/A;  . PORTACATH PLACEMENT Right 09/2014  . TUBAL LIGATION      Prior to Admission medications   Medication Sig Start Date End Date Taking? Authorizing Provider  albuterol (PROVENTIL HFA;VENTOLIN HFA) 108 (90 Base) MCG/ACT inhaler Inhale 2 puffs into the lungs every 2 (two) hours as needed for wheezing or shortness of breath (cough). 10/17/15   Annitta Needs, NP  albuterol (PROVENTIL) (2.5 MG/3ML) 0.083% nebulizer solution Take 3 mLs (2.5 mg total) by nebulization every 6 (six) hours as needed for wheezing or shortness of breath. 01/16/16   Penland, Kelby Fam, MD  ALPRAZolam Duanne Moron) 0.25 MG tablet Take 0.125 mg by mouth daily as needed for anxiety.     [provider]  amoxicillin-clavulanate (AUGMENTIN) 875-125 MG tablet Take 1 tablet by mouth 2 (two) times daily. 12/27/17   Jahel Wavra, Dannielle Karvonen, PA-C  citalopram (CELEXA) 40 MG tablet Take 40 mg by mouth at bedtime. 01/05/17    [provider]  DEXILANT 60 MG capsule TAKE ONE CAPSULE BY MOUTH DAILY Patient taking differently: TAKE 60 MG BY MOUTH DAILY 02/23/17   Annitta Needs, NP  diazepam (VALIUM) 5 MG tablet Take 1 tablet (5 mg total) by mouth every 8 (eight) hours as needed for muscle spasms. Patient not taking: Reported on 10/22/2017 09/04/17   Earleen Newport, MD  LINZESS 145 MCG CAPS capsule TAKE 1 CAPSULE (145 MCG TOTAL) BY MOUTH DAILY BEFORE BREAKFAST. 12/23/17   Mahala Menghini, PA-C  loratadine (CLARITIN) 10 MG tablet Take 10 mg by mouth daily as needed for allergies.     [provider]  LYRICA 75 MG capsule TAKE 1 CAPSULE BY MOUTH EVERYDAY AT BEDTIME Patient taking differently: TAKE 75 MG BY MOUTH EVERYDAY AT BEDTIME 10/24/17   Holley Bouche, NP  Naphazoline-Pheniramine (ALLERGY EYE OP) Place 1-2 drops into both eyes 2 (two) times daily as needed (ITCHY, WATERY EYES).    [provider]  ondansetron (ZOFRAN) 8 MG tablet Take 8 mg by mouth daily as needed (CAR SICKNESS).    [provider]  phenazopyridine (PYRIDIUM) 200 MG tablet Take 1  tablet (200 mg total) by mouth 3 (three) times daily as needed for pain. 09/04/17 09/04/18  Earleen Newport, MD  sodium chloride (OCEAN) 0.65 % SOLN nasal spray Place 1 spray into both nostrils as needed for congestion.    [provider]  sucralfate (CARAFATE) 1 GM/10ML suspension Take 10 mLs (1 g total) by mouth 4 (four) times daily. Patient taking differently: Take 1 g by mouth 2 (two) times daily.  10/22/17   Rourk, Cristopher Estimable, MD  traMADol (ULTRAM) 50 MG tablet TAKE 1 TABLET BY MOUTH EVERY 6 HOURS AS NEEDED Patient taking differently: TAKE 50 MG BY MOUTH EVERY 6 HOURS AS NEEDED FOR PAIN 07/23/17   Baird Cancer, PA-C  traZODone (DESYREL) 50 MG tablet Take 50 mg by mouth at bedtime.    [provider]    Allergies Codeine  Family History  Problem Relation Age of Onset  . Other Mother        clot that went  to heart, deceased age 68ss  . Heart attack Father        age 65s, deceased  . Stroke Father   . Cancer Father        "at death determined he was ate up with cancer"  . Hypertension Sister   . Kidney cancer Sister 49  . Diabetes Brother   . Hypertension Brother   . Endometriosis Daughter   . Heart attack Maternal Grandfather   . Diabetes Sister   . Brain cancer Paternal Aunt   . Cancer Paternal Uncle        NOS  . Cancer Paternal Uncle        NOS  . Cancer Paternal Uncle        NOS  . Colon cancer Neg Hx     Social History Social History   Tobacco Use  . Smoking status: Current Every Day Smoker    Packs/day: 0.25    Years: 25.00    Pack years: 6.25    Types: Cigarettes  . Smokeless tobacco: Never Used  . Tobacco comment: Is trying to quit/wean off.  Substance Use Topics  . Alcohol use: No  . Drug use: No    Review of Systems  Constitutional: Negative for fever. Cardiovascular: Negative for chest pain. Respiratory: Negative for shortness of breath. Gastrointestinal: Negative for abdominal pain, vomiting and diarrhea. Musculoskeletal: Negative for back pain. Skin: Negative for rash. Right hand lac as above Neurological: Negative for headaches, focal weakness or numbness. ____________________________________________  PHYSICAL EXAM:  VITAL SIGNS: ED Triage Vitals  Enc Vitals Group     BP 12/27/17 1432 123/62     Pulse Rate 12/27/17 1432 72     Resp 12/27/17 1432 18     Temp 12/27/17 1432 98.1 F (36.7 C)     Temp Source 12/27/17 1432 Oral     SpO2 12/27/17 1432 98 %     Weight 12/27/17 1430 158 lb (71.7 kg)     Height 12/27/17 1430 5\' 3"  (1.6 m)     Head Circumference --      Peak Flow --      Pain Score 12/27/17 1430 8     Pain Loc --      Pain Edu? --      Excl. in [place]? --     Constitutional: Alert and oriented. Well appearing and in no distress. Head: Normocephalic and atraumatic. Cardiovascular: Normal rate, regular rhythm. Normal distal  pulses. Respiratory: Normal respiratory effort. No wheezes/rales/rhonchi. Musculoskeletal:  Normal composite fist to the right.  Nontender with normal range of motion in all extremities.  Neurologic:  Normal gross sensation.  Normal intrinsic and opposition testing. Normal speech and language. No gross focal neurologic deficits are appreciated. Skin:  Skin is warm, dry and intact. No rash noted.  Right dorsal hand with a L-shaped superficial skin lac measuring 5.5 cm in total length.  No active bleeding is appreciated. ____________________________________________  PROCEDURES  Tdap 0.5 ml IM Augmentin 875 mg PO  .Marland KitchenLaceration Repair Date/Time: 12/27/2017 4:26 PM Performed by: Melvenia Needles, PA-C Authorized by: Melvenia Needles, PA-C   Consent:    Consent obtained:  Verbal   Consent given by:  Patient   Risks discussed:  Poor wound healing and poor cosmetic result Anesthesia (see MAR for exact dosages):    Anesthesia method:  Local infiltration   Local anesthetic:  Lidocaine 1% w/o epi Laceration details:    Location:  Hand   Hand location:  R hand, dorsum   Length (cm):  5.5 Repair type:    Repair type:  Simple Pre-procedure details:    Preparation:  Patient was prepped and draped in usual sterile fashion Treatment:    Area cleansed with:  Betadine   Amount of cleaning:  Standard   Irrigation solution:  Sterile saline   Irrigation method:  Syringe Skin repair:    Repair method:  Sutures   Suture size:  4-0   Suture material:  Nylon   Suture technique:  Simple interrupted   Number of sutures:  5 Approximation:    Approximation:  Close Post-procedure details:    Dressing:  Non-adherent dressing   Patient tolerance of procedure:  Tolerated well, no immediate complications  ____________________________________________  INITIAL IMPRESSION / ASSESSMENT AND PLAN / ED COURSE  She with ED evaluation of a laceration to the right hand following an accidental,  unintentional dog bite.  Patient describes was breaking up a fight between her Husky dogs.  Patient sustained a superficial laceration to the right hand.  The wound is cleansed and prepped, and draped, and I closed the flap with 5 simple interrupted sutures.  Wound is dressed and the patient is given wound care instructions.  She will be discharged with a prescription for Augmentin dose as directed.  She will see her PCP in 10-12 days for suture removal. ____________________________________________  FINAL CLINICAL IMPRESSION(S) / ED DIAGNOSES  Final diagnoses:  Dog bite of right hand, initial encounter      Melvenia Needles, PA-C 12/27/17 [place], [place], MD 12/28/17 [id number]

## 2017-12-27 NOTE — ED Triage Notes (Signed)
Laceration to right hand. pts dogs were righting over foot and tried to break up fight.  Dogs UTD on vaccines. Pt has had Tdap, unsure timing. Bleeding controlled

## 2017-12-27 NOTE — Discharge Instructions (Signed)
Keep the wound clean, dry, and covered. See your provider on 10-12 days for suture removal.

## 2018-01-05 ENCOUNTER — Encounter: Payer: Self-pay | Admitting: Emergency Medicine

## 2018-01-05 ENCOUNTER — Emergency Department
Admission: EM | Admit: 2018-01-05 | Discharge: 2018-01-05 | Disposition: A | Discharge status: Home / Self Care | Payer: Medicare Other | Attending: Emergency Medicine | Admitting: Emergency Medicine

## 2018-01-05 ENCOUNTER — Other Ambulatory Visit (HOSPITAL_COMMUNITY): Payer: Self-pay

## 2018-01-05 DIAGNOSIS — F17210 Nicotine dependence, cigarettes, uncomplicated: Secondary | ICD-10-CM | POA: Diagnosis not present

## 2018-01-05 DIAGNOSIS — Z85038 Personal history of other malignant neoplasm of large intestine: Secondary | ICD-10-CM | POA: Diagnosis not present

## 2018-01-05 DIAGNOSIS — Z79899 Other long term (current) drug therapy: Secondary | ICD-10-CM | POA: Diagnosis not present

## 2018-01-05 DIAGNOSIS — Z4802 Encounter for removal of sutures: Secondary | ICD-10-CM | POA: Diagnosis not present

## 2018-01-05 DIAGNOSIS — J45909 Unspecified asthma, uncomplicated: Secondary | ICD-10-CM | POA: Diagnosis not present

## 2018-01-05 NOTE — ED Triage Notes (Signed)
Pt here to have 5 stitches removed from right hand. No concern for infection.

## 2018-01-05 NOTE — ED Provider Notes (Signed)
[facility] Emergency Department Provider Note   ____________________________________________   First MD Initiated Contact with Patient 01/05/18 1307     (approximate)  I have reviewed the triage vital signs and the nursing notes.   HISTORY  Chief Complaint Suture / Staple Removal    HPI Julia Osborne is a 57 y.o. female patient presents today for suture removal from right hand.  Status post placement no decreased sensation or signs or symptoms of infection.  Patient denies pain at this time.  Past Medical History:  Diagnosis Date  . Adenocarcinoma of colon ([place]) 09/13/2015  . Anemia   . Anxiety   . Asthma   . Cancer ([place])    endometrial; cancer cells in intestine  . COPD (chronic obstructive pulmonary disease) ([place])    no definite diagnosis  . Depression   . Diverticulitis   . Dyspareunia 05/16/2015  . Family history of cancer   . Family history of kidney cancer   . GERD (gastroesophageal reflux disease)   . Indigestion   . Lynch syndrome   . Neuropathy    feet and hands  . Uterine fibroid   . Vaginal dryness 05/16/2015  . Vaginal itching 07/16/2015  . Vaginal Pap smear, abnormal     Patient Active Problem List   Diagnosis Date Noted  . Abdominal pain 12/21/2016  . Lynch syndrome 10/31/2015  . Genetic testing 10/21/2015  . Abdominal pain, left upper quadrant 10/17/2015  . Family history of cancer   . Family history of kidney cancer   . Adenocarcinoma of colon ([place]) 09/13/2015  . Colon neoplasm 08/30/2015  . Vaginal itching 07/16/2015  . History of colonic polyps   . 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 drug ([place]) 06/28/2015  . Constipation 06/05/2015  . Abnormal CT scan, sigmoid colon 06/05/2015  . Vaginal dryness 05/16/2015  . Dyspareunia 05/16/2015  . Mucosal abnormality of stomach   . Reflux esophagitis   . Dysphagia   . Hematochezia   . Diverticulosis of colon without hemorrhage   . Rectal bleeding 02/14/2015  . GERD  (gastroesophageal reflux disease) 02/14/2015  . Esophageal dysphagia 02/14/2015  . Abdominal pain, epigastric 02/14/2015  . Nausea with vomiting 02/14/2015  . Endometrial cancer ([place]) 10/09/2014  . Conversion reaction   . Depression   . Aphasia 09/11/2014    Past Surgical History:  Procedure Laterality Date  . ABDOMINAL HYSTERECTOMY    . APPENDECTOMY    . BIOPSY N/A 03/14/2015   Procedure: BIOPSY;  Surgeon: Daneil Dolin, MD;  Location: [facility];  Service: Endoscopy;  Laterality: N/A;  Gastric  . COLONOSCOPY N/A 01/28/2017   Procedure: COLONOSCOPY;  Surgeon: Daneil Dolin, MD;  Location: [facility];  Service: Endoscopy;  Laterality: N/A;  11:30am  . COLONOSCOPY WITH PROPOFOL N/A 03/14/2015   RMR: Internal hemorrhoids. colonic diverticulosis. Incomplete examination. Prepartation inadequate.  . COLONOSCOPY WITH PROPOFOL N/A 07/04/2015   RMR: Colonic diverticulosis . Large polypoid lesion in the vicinity of the hepatic flexure status post saline-assisted piecmeal snare polypectomy  with ablation and tattooing as described. Sigmoid polyp removed as described above. sigmoid colon polyp hyperplastic, hepatic flexure polyp with TA with focal high grade dysplasia   . ESOPHAGEAL DILATION N/A 03/14/2015   Procedure: ESOPHAGEAL DILATION;  Surgeon: Daneil Dolin, MD;  Location: [facility];  Service: Endoscopy;  Laterality: N/A;  [place] 18  . ESOPHAGOGASTRODUODENOSCOPY N/A 05/19/2016   Procedure: ESOPHAGOGASTRODUODENOSCOPY (EGD);  Surgeon: Daneil Dolin, MD;  [facility]: [facility];  Service: Endoscopy;  Laterality: N/A;  215  . ESOPHAGOGASTRODUODENOSCOPY N/A 01/28/2017   Procedure: ESOPHAGOGASTRODUODENOSCOPY (EGD);  Surgeon: Daneil Dolin, MD;  [facility];  Service: Endoscopy;  Laterality: N/A;  . ESOPHAGOGASTRODUODENOSCOPY (EGD) WITH PROPOFOL N/A 03/14/2015   RMR: Mild erosive reflux esophagitis status post passage o f a [place] dilator. Abnormal gastric mucosa of uncertain significance  as described above. status post biopsy, benign  . ESOPHAGOGASTRODUODENOSCOPY (EGD) WITH PROPOFOL N/A 11/18/2017   Procedure: ESOPHAGOGASTRODUODENOSCOPY (EGD) WITH PROPOFOL;  Surgeon: Daneil Dolin, MD;  [facility];  Service: Endoscopy;  Laterality: N/A;  12:15pm  . MALONEY DILATION N/A 05/19/2016   Procedure: [name] DILATION;  Surgeon: Daneil Dolin, MD;  [facility];  Service: Endoscopy;  Laterality: N/A;  . [name] DILATION N/A 01/28/2017   Procedure: [name] DILATION;  Surgeon: Daneil Dolin, MD;  [facility];  Service: Endoscopy;  Laterality: N/A;  [name] DILATION N/A 11/18/2017   Procedure: [name] DILATION;  Surgeon: Daneil Dolin, MD;  [facility];  Service: Endoscopy;  Laterality: N/A;  . PARTIAL COLECTOMY  08/30/2015   polyp with adenocarcinoma  . POLYPECTOMY N/A 07/04/2015   Procedure: POLYPECTOMY;  Surgeon: Daneil Dolin, MD;  Location: [facility];  Service: Endoscopy;  Laterality: N/A;  . PORTACATH PLACEMENT Right 09/2014  . TUBAL LIGATION      Prior to Admission medications   Medication Sig Start Date End Date Taking? Authorizing Provider  albuterol (PROVENTIL HFA;VENTOLIN HFA) 108 (90 Base) MCG/ACT inhaler Inhale 2 puffs into the lungs every 2 (two) hours as needed for wheezing or shortness of breath (cough). 10/17/15   Annitta Needs, NP  albuterol (PROVENTIL) (2.5 MG/3ML) 0.083% nebulizer solution Take 3 mLs (2.5 mg total) by nebulization every 6 (six) hours as needed for wheezing or shortness of breath. 01/16/16   Penland, Kelby Fam, MD  ALPRAZolam Duanne Moron) 0.25 MG tablet Take 0.125 mg by mouth daily as needed for anxiety.     [provider]  amoxicillin-clavulanate (AUGMENTIN) 875-125 MG tablet Take 1 tablet by mouth 2 (two) times daily. 12/27/17   Menshew, Dannielle Karvonen, PA-C  citalopram (CELEXA) 40 MG tablet Take 40 mg by mouth at bedtime. 01/05/17   [provider]  DEXILANT 60 MG capsule TAKE ONE CAPSULE BY  MOUTH DAILY Patient taking differently: TAKE 60 MG BY MOUTH DAILY 02/23/17   Annitta Needs, NP  diazepam (VALIUM) 5 MG tablet Take 1 tablet (5 mg total) by mouth every 8 (eight) hours as needed for muscle spasms. Patient not taking: Reported on 10/22/2017 09/04/17   Earleen Newport, MD  LINZESS 145 MCG CAPS capsule TAKE 1 CAPSULE (145 MCG TOTAL) BY MOUTH DAILY BEFORE BREAKFAST. 12/23/17   Mahala Menghini, PA-C  loratadine (CLARITIN) 10 MG tablet Take 10 mg by mouth daily as needed for allergies.     [provider]  LYRICA 75 MG capsule TAKE 1 CAPSULE BY MOUTH EVERYDAY AT BEDTIME Patient taking differently: TAKE 75 MG BY MOUTH EVERYDAY AT BEDTIME 10/24/17   Holley Bouche, NP  Naphazoline-Pheniramine (ALLERGY EYE OP) Place 1-2 drops into both eyes 2 (two) times daily as needed (ITCHY, WATERY EYES).    [provider]  ondansetron (ZOFRAN) 8 MG tablet Take 8 mg by mouth daily as needed (CAR SICKNESS).    [provider]  phenazopyridine (PYRIDIUM) 200 MG tablet Take 1 tablet (200 mg total) by mouth 3 (three) times daily as  needed for pain. 09/04/17 09/04/18  Earleen Newport, MD  sodium chloride (OCEAN) 0.65 % SOLN nasal spray Place 1 spray into both nostrils as needed for congestion.    [provider]  sucralfate (CARAFATE) 1 GM/10ML suspension Take 10 mLs (1 g total) by mouth 4 (four) times daily. Patient taking differently: Take 1 g by mouth 2 (two) times daily.  10/22/17   Rourk, Cristopher Estimable, MD  traMADol (ULTRAM) 50 MG tablet TAKE 1 TABLET BY MOUTH EVERY 6 HOURS AS NEEDED Patient taking differently: TAKE 50 MG BY MOUTH EVERY 6 HOURS AS NEEDED FOR PAIN 07/23/17   Baird Cancer, PA-C  traZODone (DESYREL) 50 MG tablet Take 50 mg by mouth at bedtime.    [provider]    Allergies Codeine  Family History  Problem Relation Age of Onset  . Other Mother        clot that went to heart, deceased age 48ss  . Heart attack Father        age 70s,  deceased  . Stroke Father   . Cancer Father        "at death determined he was ate up with cancer"  . Hypertension Sister   . Kidney cancer Sister 49  . Diabetes Brother   . Hypertension Brother   . Endometriosis Daughter   . Heart attack Maternal Grandfather   . Diabetes Sister   . Brain cancer Paternal Aunt   . Cancer Paternal Uncle        NOS  . Cancer Paternal Uncle        NOS  . Cancer Paternal Uncle        NOS  . Colon cancer Neg Hx     Social History Social History   Tobacco Use  . Smoking status: Current Every Day Smoker    Packs/day: 0.25    Years: 25.00    Pack years: 6.25    Types: Cigarettes  . Smokeless tobacco: Never Used  . Tobacco comment: Is trying to quit/wean off.  Substance Use Topics  . Alcohol use: No  . Drug use: No    Review of Systems  Constitutional: No fever/chills Eyes: No visual changes. ENT: No sore throat. Cardiovascular: Denies chest pain. Respiratory: Denies shortness of breath. Gastrointestinal: No abdominal pain.  No nausea, no vomiting.  No diarrhea.  No constipation. Genitourinary: Negative for dysuria. Musculoskeletal: Negative for back pain. Skin: Negative for rash. Neurological: Negative for headaches, focal weakness or numbness. Psychiatric:Anxiety and depression Allergic/Immunilogical: Codeine. ____________________________________________   PHYSICAL EXAM:  VITAL SIGNS: ED Triage Vitals  Enc Vitals Group     BP 01/05/18 1305 108/75     Pulse Rate 01/05/18 1305 70     Resp 01/05/18 1305 20     Temp 01/05/18 1305 98.6 F (37 C)     Temp Source 01/05/18 1305 Oral     SpO2 01/05/18 1305 97 %     Weight 01/05/18 1306 158 lb (71.7 kg)     Height 01/05/18 1306 5\' 3"  (1.6 m)     Head Circumference --      Peak Flow --      Pain Score 01/05/18 1306 0     Pain Loc --      Pain Edu? --      Excl. in [place]? --    Constitutional: Alert and oriented. Well appearing and in no acute distress. Cardiovascular: Normal  rate, regular rhythm. Grossly normal heart sounds.  Good peripheral circulation.  Respiratory: Normal respiratory effort.  No retractions. Lungs CTAB. Neurologic:  Normal speech and language. No gross focal neurologic deficits are appreciated. No gait instability. Skin:  Skin is warm, dry and intact. No rash noted.  Healed laceration dorsal aspect of the right hand. Psychiatric: Mood and affect are normal. Speech and behavior are normal.  ____________________________________________   LABS (all labs ordered are listed, but only abnormal results are displayed)  Labs Reviewed - No data to display ____________________________________________  EKG   ____________________________________________  RADIOLOGY  ED MD interpretation:    Official radiology report(s): No results found.  ____________________________________________   PROCEDURES  Procedure(s) performed: None  .Suture Removal Date/Time: 01/05/2018 1:27 PM Performed by: Sable Feil, PA-C Authorized by: Sable Feil, PA-C   Consent:    Consent obtained:  Verbal   Consent given by:  Patient Location:    Location:  Upper extremity   Upper extremity location:  Hand   Hand location:  R hand Procedure details:    Wound appearance:  No signs of infection, good wound healing and moist   Number of sutures removed:  5 Post-procedure details:    Post-removal:  Steri-Strips applied   Patient tolerance of procedure:  Tolerated well, no immediate complications    Critical Care performed: No  ____________________________________________   INITIAL IMPRESSION / ASSESSMENT AND PLAN / ED COURSE  As part of my medical decision making, I reviewed the following data within the [place]    Patient presents for suture removal secondary to a healed laceration of the right hand.  5 sutures were removed.  Area was Steri-Stripped and patient given discharge care instructions.      ____________________________________________   FINAL CLINICAL IMPRESSION(S) / ED DIAGNOSES  Final diagnoses:  None     ED Discharge Orders    None       Note:  This document was prepared using Dragon voice recognition software and may include unintentional dictation errors.    Sable Feil, PA-C 01/05/18 1328    Earleen Newport, MD 01/05/18 [phone]

## 2018-01-05 NOTE — Discharge Instructions (Addendum)
Follow discharge care instructions and allow Steri-Strips to fall off naturally.

## 2018-01-06 ENCOUNTER — Inpatient Hospital Stay (HOSPITAL_COMMUNITY): Payer: Medicare Other | Attending: Hematology

## 2018-01-06 DIAGNOSIS — Z1509 Genetic susceptibility to other malignant neoplasm: Secondary | ICD-10-CM | POA: Diagnosis not present

## 2018-01-06 DIAGNOSIS — F17210 Nicotine dependence, cigarettes, uncomplicated: Secondary | ICD-10-CM | POA: Insufficient documentation

## 2018-01-06 DIAGNOSIS — K760 Fatty (change of) liver, not elsewhere classified: Secondary | ICD-10-CM | POA: Insufficient documentation

## 2018-01-06 DIAGNOSIS — J449 Chronic obstructive pulmonary disease, unspecified: Secondary | ICD-10-CM | POA: Insufficient documentation

## 2018-01-06 DIAGNOSIS — Z86010 Personal history of colonic polyps: Secondary | ICD-10-CM | POA: Insufficient documentation

## 2018-01-06 DIAGNOSIS — Z90722 Acquired absence of ovaries, bilateral: Secondary | ICD-10-CM | POA: Insufficient documentation

## 2018-01-06 DIAGNOSIS — C189 Malignant neoplasm of colon, unspecified: Secondary | ICD-10-CM

## 2018-01-06 DIAGNOSIS — R1310 Dysphagia, unspecified: Secondary | ICD-10-CM | POA: Insufficient documentation

## 2018-01-06 DIAGNOSIS — R197 Diarrhea, unspecified: Secondary | ICD-10-CM | POA: Insufficient documentation

## 2018-01-06 DIAGNOSIS — Z8719 Personal history of other diseases of the digestive system: Secondary | ICD-10-CM | POA: Insufficient documentation

## 2018-01-06 DIAGNOSIS — I700 Atherosclerosis of aorta: Secondary | ICD-10-CM | POA: Insufficient documentation

## 2018-01-06 DIAGNOSIS — Z7982 Long term (current) use of aspirin: Secondary | ICD-10-CM | POA: Insufficient documentation

## 2018-01-06 DIAGNOSIS — Z85038 Personal history of other malignant neoplasm of large intestine: Secondary | ICD-10-CM | POA: Diagnosis not present

## 2018-01-06 DIAGNOSIS — Z9049 Acquired absence of other specified parts of digestive tract: Secondary | ICD-10-CM | POA: Insufficient documentation

## 2018-01-06 DIAGNOSIS — Z90710 Acquired absence of both cervix and uterus: Secondary | ICD-10-CM | POA: Insufficient documentation

## 2018-01-06 DIAGNOSIS — Z8542 Personal history of malignant neoplasm of other parts of uterus: Secondary | ICD-10-CM | POA: Diagnosis not present

## 2018-01-06 DIAGNOSIS — C541 Malignant neoplasm of endometrium: Secondary | ICD-10-CM

## 2018-01-06 DIAGNOSIS — Z8051 Family history of malignant neoplasm of kidney: Secondary | ICD-10-CM | POA: Diagnosis not present

## 2018-01-06 DIAGNOSIS — K219 Gastro-esophageal reflux disease without esophagitis: Secondary | ICD-10-CM | POA: Diagnosis not present

## 2018-01-06 DIAGNOSIS — Z79899 Other long term (current) drug therapy: Secondary | ICD-10-CM | POA: Diagnosis not present

## 2018-01-06 DIAGNOSIS — R531 Weakness: Secondary | ICD-10-CM | POA: Insufficient documentation

## 2018-01-06 DIAGNOSIS — G629 Polyneuropathy, unspecified: Secondary | ICD-10-CM | POA: Insufficient documentation

## 2018-01-06 DIAGNOSIS — Z809 Family history of malignant neoplasm, unspecified: Secondary | ICD-10-CM | POA: Diagnosis not present

## 2018-01-06 LAB — CBC WITH DIFFERENTIAL/PLATELET
Basophils Absolute: 0 10*3/uL (ref 0.0–0.1)
Basophils Relative: 0 %
EOS PCT: 3 %
Eosinophils Absolute: 0.2 10*3/uL (ref 0.0–0.7)
HCT: 38.5 % (ref 36.0–46.0)
HEMOGLOBIN: 12.7 g/dL (ref 12.0–15.0)
LYMPHS PCT: 33 %
Lymphs Abs: 2.6 10*3/uL (ref 0.7–4.0)
MCH: 29.9 pg (ref 26.0–34.0)
MCHC: 33 g/dL (ref 30.0–36.0)
MCV: 90.6 fL (ref 78.0–100.0)
Monocytes Absolute: 0.6 10*3/uL (ref 0.1–1.0)
Monocytes Relative: 7 %
NEUTROS ABS: 4.6 10*3/uL (ref 1.7–7.7)
NEUTROS PCT: 57 %
PLATELETS: 251 10*3/uL (ref 150–400)
RBC: 4.25 MIL/uL (ref 3.87–5.11)
RDW: 14.1 % (ref 11.5–15.5)
WBC: 8 10*3/uL (ref 4.0–10.5)

## 2018-01-06 LAB — COMPREHENSIVE METABOLIC PANEL
ALT: 21 U/L (ref 14–54)
AST: 18 U/L (ref 15–41)
Albumin: 4.1 g/dL (ref 3.5–5.0)
Alkaline Phosphatase: 64 U/L (ref 38–126)
Anion gap: 11 (ref 5–15)
BUN: 10 mg/dL (ref 6–20)
CHLORIDE: 104 mmol/L (ref 101–111)
CO2: 26 mmol/L (ref 22–32)
CREATININE: 0.7 mg/dL (ref 0.44–1.00)
Calcium: 9.2 mg/dL (ref 8.9–10.3)
GFR calc Af Amer: >60 mL/min (ref >60)
Glucose, Bld: 105 mg/dL — ABNORMAL HIGH (ref 65–99)
Potassium: 3.6 mmol/L (ref 3.5–5.1)
SODIUM: 141 mmol/L (ref 135–145)
Total Bilirubin: 0.4 mg/dL (ref 0.3–1.2)
Total Protein: 7.4 g/dL (ref 6.5–8.1)

## 2018-01-07 ENCOUNTER — Other Ambulatory Visit: Payer: Self-pay | Admitting: Gastroenterology

## 2018-01-07 LAB — CA 125: Cancer Antigen (CA) 125: 14.5 U/mL (ref 0.0–38.1)

## 2018-01-07 LAB — CEA: CEA: 3.6 ng/mL (ref 0.0–4.7)

## 2018-01-12 ENCOUNTER — Encounter (HOSPITAL_COMMUNITY): Payer: Self-pay | Admitting: Internal Medicine

## 2018-01-12 ENCOUNTER — Inpatient Hospital Stay (HOSPITAL_BASED_OUTPATIENT_CLINIC_OR_DEPARTMENT_OTHER): Payer: Medicare Other | Admitting: Internal Medicine

## 2018-01-12 VITALS — HR 81 | Temp 98.3°F | Resp 18 | Wt 158.8 lb

## 2018-01-12 DIAGNOSIS — Z8542 Personal history of malignant neoplasm of other parts of uterus: Secondary | ICD-10-CM

## 2018-01-12 DIAGNOSIS — G629 Polyneuropathy, unspecified: Secondary | ICD-10-CM | POA: Diagnosis not present

## 2018-01-12 DIAGNOSIS — K219 Gastro-esophageal reflux disease without esophagitis: Secondary | ICD-10-CM | POA: Diagnosis not present

## 2018-01-12 DIAGNOSIS — Z86010 Personal history of colonic polyps: Secondary | ICD-10-CM | POA: Diagnosis not present

## 2018-01-12 DIAGNOSIS — Z7982 Long term (current) use of aspirin: Secondary | ICD-10-CM

## 2018-01-12 DIAGNOSIS — Z79899 Other long term (current) drug therapy: Secondary | ICD-10-CM

## 2018-01-12 DIAGNOSIS — F17210 Nicotine dependence, cigarettes, uncomplicated: Secondary | ICD-10-CM | POA: Diagnosis not present

## 2018-01-12 DIAGNOSIS — Z90722 Acquired absence of ovaries, bilateral: Secondary | ICD-10-CM

## 2018-01-12 DIAGNOSIS — Z1509 Genetic susceptibility to other malignant neoplasm: Secondary | ICD-10-CM

## 2018-01-12 DIAGNOSIS — I700 Atherosclerosis of aorta: Secondary | ICD-10-CM | POA: Diagnosis not present

## 2018-01-12 DIAGNOSIS — R531 Weakness: Secondary | ICD-10-CM | POA: Diagnosis not present

## 2018-01-12 DIAGNOSIS — R1310 Dysphagia, unspecified: Secondary | ICD-10-CM

## 2018-01-12 DIAGNOSIS — Z8719 Personal history of other diseases of the digestive system: Secondary | ICD-10-CM

## 2018-01-12 DIAGNOSIS — Z90710 Acquired absence of both cervix and uterus: Secondary | ICD-10-CM

## 2018-01-12 DIAGNOSIS — J449 Chronic obstructive pulmonary disease, unspecified: Secondary | ICD-10-CM

## 2018-01-12 DIAGNOSIS — R197 Diarrhea, unspecified: Secondary | ICD-10-CM | POA: Diagnosis not present

## 2018-01-12 DIAGNOSIS — Z85038 Personal history of other malignant neoplasm of large intestine: Secondary | ICD-10-CM

## 2018-01-12 DIAGNOSIS — K760 Fatty (change of) liver, not elsewhere classified: Secondary | ICD-10-CM

## 2018-01-12 DIAGNOSIS — C541 Malignant neoplasm of endometrium: Secondary | ICD-10-CM

## 2018-01-12 DIAGNOSIS — Z8051 Family history of malignant neoplasm of kidney: Secondary | ICD-10-CM

## 2018-01-12 DIAGNOSIS — Z9049 Acquired absence of other specified parts of digestive tract: Secondary | ICD-10-CM

## 2018-01-12 DIAGNOSIS — Z809 Family history of malignant neoplasm, unspecified: Secondary | ICD-10-CM

## 2018-01-12 NOTE — Progress Notes (Signed)
Diagnosis No diagnosis found.  Staging Cancer Staging Adenocarcinoma of colon [facility]) Staging form: Colon and Rectum, AJCC 7th Edition - Clinical stage from 09/13/2015: Stage 0 (Tis, N0, M0) - Signed by Baird Cancer, PA-C on 09/13/2015 Prognostic indicators: 0/17 lymph nodes, negative margins  Endometrial cancer ([place]) Staging form: Corpus Uteri - Carcinoma, AJCC 7th Edition - Clinical stage from 10/09/2014: FIGO Stage IVB (T3b(m), N1, M1) - Unsigned   Assessment and Plan: 1.  Stage IV endometrial cancer (diagnosed in 07/2014).  Pt was previously followed by Dr Talbert Cage.  She is s/p TAH/BSO on 07/31/14, followed by adjuvant chemotherapy with Carbo/Taxol x 5 cycles, completed on 01/09/15. CT abdomen and pelvis done 09/28/2017 showed  IMPRESSION: 1. Fluid/liquid stool in the ascending, transverse, and descending colon which can be seen with diarrheal process. No colonic inflammation. 2. Possible epiploic appendagitis about the sigmoid colon. 3. Mild hepatic steatosis. 4.  Aortic Atherosclerosis (ICD10-I70.0).  CA-125 WNL at 14.5 on labs done 12/2017.  She reports due to distance she would like to transfer her records to [place] center.  Patient will be set up to establish care.  She is now 3 years out from treatment completion.  She is assigned to prn follow-up at [facility].    She remains NED based on recent imaging.  Will likely need repeat imaging at [place] [facility] in 03/2018.    2.  Adenocarcinoma of colon/Lynch syndrome.  Pt is s/p partial colectomy in 08/2015; CEA not elevated at time of diagnosis.  CEA 3.6 on labs done 12/2017.  -Last colonoscopy in May 2018 was normal.  Recent CT of the abdomen and pelvis done 09/28/2017 showed no clear evidence of recurrence.  Will continue to follow tumor markers.    3.  GI symptoms.  Pt should follow-up with GI as recommended.    Interval history:  57 year old female previously followed by Dr. Talbert Cage for Stage IV endometrial cancer and  adenocarcinoma of the colon; she has Lynch Syndrome.   Current Status: Patient reports she desires to transfer care to [facility] due to distance.  She has some difficulty swallowing liquids and solids.  She also reports some weakness.  She is here to go over her last scan.  PATHOLOGY:  TAH/BSO surgical path [facility]): 08/04/14      Surgical path colon: 08/30/15         Endometrial cancer ([place])   07/12/2014 Imaging    CT C/A/P, pelvic adenopathy, multiple pulmonary nodules concerning for metastatic disease      07/30/2014 Initial Diagnosis    Endometrial cancer      07/31/2014 Pathology Results    endometrioid adenocarcinoma, G2, lymphovascular invasion present, positive pelvic lymph node and vaginal biopsy      07/31/2014 Definitive Surgery    radical hysterectomy, BSO, pelvic lymph node dissection and vaginal biopsies      09/25/2014 Procedure    Port-A-Cath placement in IR      10/18/2014 - 01/10/2015 Chemotherapy    Carboplatin/Taxol. First cycle given at [facility].  S/P 6 cycles total, 5 cycles given at [facility].      07/04/2015 Procedure    Colonoscopy by Dr. Gala Romney      07/04/2015 Pathology Results    Colon, polyp(s), vicinity of hepatic flexure - TUBULAR ADENOMA WITH FOCAL HIGH GRADE DYSPLASIA.       Procedure    Partial colectomy by Dr. Arnoldo Morale scheduled for 08/30/2015      10/31/2015 Genetic Testing    [facility]  SYNDROME MSH6 Mutation      04/27/2016 Imaging    CT CAP- No acute process or evidence of metastatic disease in the chest. Right middle lobe pulmonary nodule is unchanged back to 11/20/2014, favoring a benign etiology      11/10/2016 Imaging    CT CAP- 1. No evidence of metastatic disease in the chest, abdomen or pelvis. 2. No evidence of local tumor recurrence at the ileocolic anastomosis in the right abdomen or in the pelvis. Decreased mild fat stranding at the base of the right mesentery, most consistent with postsurgical scarring. 3.  Aortic atherosclerosis.       Adenocarcinoma of colon ([place])   07/04/2015 Pathology Results    Diagnosis 1. Colon, polyp(s), vicinity of hepatic flexure - TUBULAR ADENOMA WITH FOCAL HIGH GRADE DYSPLASIA. - NO INVASIVE CARCINOMA. 2. Colon, polyp(s), sigmoid - HYPERPLASTIC POLYP. - NO DYSPLASIA OR MALIGNANCY.      07/04/2015 Procedure    Colonoscopoy by Dr. Gala Romney- large polypoid colonic lesion.      08/30/2015 Definitive Surgery    Dr. Arnoldo Morale- right segmental colon resection      08/30/2015 Pathology Results    TisN0M0 intramucosal adenocarcinoma of colon, 0.2 cm inding the laminal propria with negative resection margins and 0/17 lymph nodes.        Problem List Patient Active Problem List   Diagnosis Date Noted  . Abdominal pain [R10.9] 12/21/2016  . Lynch syndrome [Z15.09] 10/31/2015  . Genetic testing [Z13.79] 10/21/2015  . Abdominal pain, left upper quadrant [R10.12] 10/17/2015  . Family history of cancer [Z80.9]   . Family history of kidney cancer [Z80.51]   . Adenocarcinoma of colon ([place]) [C18.9] 09/13/2015  . Colon neoplasm [D49.0] 08/30/2015  . Vaginal itching [N89.8] 07/16/2015  . History of colonic polyps [Z86.010]   . 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 drug ([place]) [G62.0, T45.1X5A] 06/28/2015  . Constipation [K59.00] 06/05/2015  . Abnormal CT scan, sigmoid colon [R93.3] 06/05/2015  . Vaginal dryness [N89.8] 05/16/2015  . Dyspareunia [IMO0002] 05/16/2015  . Mucosal abnormality of stomach [K31.89]   . Reflux esophagitis [K21.0]   . Dysphagia [R13.10]   . Hematochezia [K92.1]   . Diverticulosis of colon without hemorrhage [K57.30]   . Rectal bleeding [K62.5] 02/14/2015  . GERD (gastroesophageal reflux disease) [K21.9] 02/14/2015  . Esophageal dysphagia [R13.10] 02/14/2015  . Abdominal pain, epigastric [R10.13] 02/14/2015  . Nausea with vomiting [R11.2] 02/14/2015  . Endometrial cancer ([place]) [C54.1] 10/09/2014  . Conversion reaction [F44.9]   . Depression  [F32.9]   . Aphasia [R47.01] 09/11/2014    Past Medical History Past Medical History:  Diagnosis Date  . Adenocarcinoma of colon ([place]) 09/13/2015  . Anemia   . Anxiety   . Asthma   . Cancer ([place])    endometrial; cancer cells in intestine  . COPD (chronic obstructive pulmonary disease) ([place])    no definite diagnosis  . Depression   . Diverticulitis   . Dyspareunia 05/16/2015  . Family history of cancer   . Family history of kidney cancer   . GERD (gastroesophageal reflux disease)   . Indigestion   . Lynch syndrome   . Neuropathy    feet and hands  . Uterine fibroid   . Vaginal dryness 05/16/2015  . Vaginal itching 07/16/2015  . Vaginal Pap smear, abnormal     Past Surgical History Past Surgical History:  Procedure Laterality Date  . ABDOMINAL HYSTERECTOMY    . APPENDECTOMY    . BIOPSY N/A 03/14/2015   Procedure: BIOPSY;  Surgeon: Daneil Dolin, MD;  Location: [facility];  Service: Endoscopy;  Laterality: N/A;  Gastric  . COLONOSCOPY N/A 01/28/2017   Procedure: COLONOSCOPY;  Surgeon: Daneil Dolin, MD;  Location: [facility] SU[facility];  Service: Endoscopy;  Laterality: N/A;  11:30am  . COLONOSCOPY WITH PROPOFOL N/A 03/14/2015   RMR: Internal hemorrhoids. colonic diverticulosis. Incomplete examination. Prepartation inadequate.  . COLONOSCOPY WITH PROPOFOL N/A 07/04/2015   RMR: Colonic diverticulosis . Large polypoid lesion in the vicinity of the hepatic flexure status post saline-assisted piecmeal snare polypectomy  with ablation and tattooing as described. Sigmoid polyp removed as described above. sigmoid colon polyp hyperplastic, hepatic flexure polyp with TA with focal high grade dysplasia   . ESOPHAGEAL DILATION N/A 03/14/2015   Procedure: ESOPHAGEAL DILATION;  Surgeon: Daneil Dolin, MD;  Location: [facility];  Service: Endoscopy;  Laterality: N/A;  Maloney 60  . ESOPHAGOGASTRODUODENOSCOPY N/A 05/19/2016   Procedure: ESOPHAGOGASTRODUODENOSCOPY (EGD);  Surgeon: Daneil Dolin, MD;   Location: [facility];  Service: Endoscopy;  Laterality: N/A;  215  . ESOPHAGOGASTRODUODENOSCOPY N/A 01/28/2017   Procedure: ESOPHAGOGASTRODUODENOSCOPY (EGD);  Surgeon: Daneil Dolin, MD;  Location[facility];  Service: Endoscopy;  Laterality: N/A;  . ESOPHAGOGASTRODUODENOSCOPY (EGD) WITH PROPOFOL N/A 03/14/2015   RMR: Mild erosive reflux esophagitis status post passage o f a [place] dilator. Abnormal gastric mucosa of uncertain significance as described above. status post biopsy, benign  . ESOPHAGOGASTRODUODENOSCOPY (EGD) WITH PROPOFOL N/A 11/18/2017   Procedure: ESOPHAGOGASTRODUODENOSCOPY (EGD) WITH PROPOFOL;  Surgeon: Daneil Dolin, MD;  [facility];  Service: Endoscopy;  Laterality: N/A;  12:15pm  . MALONEY DILATION N/A 05/19/2016   Procedure: [name] DILATION;  Surgeon: Daneil Dolin, MD;  [facility];  Service: Endoscopy;  Laterality: N/A;  . [name] DILATION N/A 01/28/2017   Procedure: [name] DILATION;  Surgeon: Daneil Dolin, MD;  [facility];  Service: Endoscopy;  Laterality: N/A;  [name] DILATION N/A 11/18/2017   Procedure: [name] DILATION;  Surgeon: Daneil Dolin, MD;  [facility];  Service: Endoscopy;  Laterality: N/A;  . PARTIAL COLECTOMY  08/30/2015   polyp with adenocarcinoma  . POLYPECTOMY N/A 07/04/2015   Procedure: POLYPECTOMY;  Surgeon: Daneil Dolin, MD;  Location: [facility];  Service: Endoscopy;  Laterality: N/A;  . PORTACATH PLACEMENT Right 09/2014  . TUBAL LIGATION      Family History Family History  Problem Relation Age of Onset  . Other Mother        clot that went to heart, deceased age 41ss  . Heart attack Father        age 91s, deceased  . Stroke Father   . Cancer Father        "at death determined he was ate up with cancer"  . Hypertension Sister   . Kidney cancer Sister 57  . Diabetes Brother   . Hypertension Brother   . Endometriosis Daughter   . Heart attack Maternal Grandfather   . Diabetes  Sister   . Brain cancer Paternal Aunt   . Cancer Paternal Uncle        NOS  . Cancer Paternal Uncle        NOS  . Cancer Paternal Uncle        NOS  . Colon cancer Neg Hx      Social History  reports that she has been smoking cigarettes.  She has a 6.25 pack-year smoking history. She has never used smokeless  tobacco. She reports that she does not drink alcohol or use drugs.  Medications  Current Outpatient Medications:  .  albuterol (PROVENTIL HFA;VENTOLIN HFA) 108 (90 Base) MCG/ACT inhaler, Inhale 2 puffs into the lungs every 2 (two) hours as needed for wheezing or shortness of breath (cough)., Disp: 1 Inhaler, Rfl: 3 .  albuterol (PROVENTIL) (2.5 MG/3ML) 0.083% nebulizer solution, Take 3 mLs (2.5 mg total) by nebulization every 6 (six) hours as needed for wheezing or shortness of breath., Disp: 75 mL, Rfl: 12 .  ALPRAZolam (XANAX) 0.25 MG tablet, Take 0.125 mg by mouth daily as needed for anxiety. , Disp: , Rfl:  .  amoxicillin-clavulanate (AUGMENTIN) 875-125 MG tablet, Take 1 tablet by mouth 2 (two) times daily., Disp: 20 tablet, Rfl: 0 .  citalopram (CELEXA) 40 MG tablet, Take 40 mg by mouth at bedtime., Disp: , Rfl: 2 .  DEXILANT 60 MG capsule, TAKE 1 CAPSULE BY MOUTH EVERY DAY, Disp: 90 capsule, Rfl: 3 .  diazepam (VALIUM) 5 MG tablet, Take 1 tablet (5 mg total) by mouth every 8 (eight) hours as needed for muscle spasms. (Patient not taking: Reported on 10/22/2017), Disp: 20 tablet, Rfl: 0 .  LINZESS 145 MCG CAPS capsule, TAKE 1 CAPSULE (145 MCG TOTAL) BY MOUTH DAILY BEFORE BREAKFAST., Disp: 90 capsule, Rfl: 3 .  loratadine (CLARITIN) 10 MG tablet, Take 10 mg by mouth daily as needed for allergies. , Disp: , Rfl:  .  LYRICA 75 MG capsule, TAKE 1 CAPSULE BY MOUTH EVERYDAY AT BEDTIME (Patient taking differently: TAKE 75 MG BY MOUTH EVERYDAY AT BEDTIME), Disp: 30 capsule, Rfl: 2 .  Naphazoline-Pheniramine (ALLERGY EYE OP), Place 1-2 drops into both eyes 2 (two) times daily as needed  (ITCHY, WATERY EYES)., Disp: , Rfl:  .  ondansetron (ZOFRAN) 8 MG tablet, Take 8 mg by mouth daily as needed (CAR SICKNESS)., Disp: , Rfl:  .  phenazopyridine (PYRIDIUM) 200 MG tablet, Take 1 tablet (200 mg total) by mouth 3 (three) times daily as needed for pain., Disp: 20 tablet, Rfl: 0 .  sodium chloride (OCEAN) 0.65 % SOLN nasal spray, Place 1 spray into both nostrils as needed for congestion., Disp: , Rfl:  .  sucralfate (CARAFATE) 1 GM/10ML suspension, Take 10 mLs (1 g total) by mouth 4 (four) times daily. (Patient taking differently: Take 1 g by mouth 2 (two) times daily. ), Disp: 420 mL, Rfl: 1 .  traMADol (ULTRAM) 50 MG tablet, TAKE 1 TABLET BY MOUTH EVERY 6 HOURS AS NEEDED (Patient taking differently: TAKE 50 MG BY MOUTH EVERY 6 HOURS AS NEEDED FOR PAIN), Disp: 30 tablet, Rfl: 2 .  traZODone (DESYREL) 50 MG tablet, Take 50 mg by mouth at bedtime., Disp: , Rfl:   Allergies Codeine  Review of Systems Review of Systems - Oncology ROS as per HPI otherwise 12 point ROS is negative.   Physical Exam  Vitals Wt Readings from Last 3 Encounters:  01/12/18 158 lb 12.8 oz (72 kg)  01/05/18 158 lb (71.7 kg)  12/27/17 158 lb (71.7 kg)   Temp Readings from Last 3 Encounters:  01/12/18 98.3 F (36.8 C) (Oral)  01/05/18 98.6 F (37 C) (Oral)  12/27/17 98.1 F (36.7 C) (Oral)   BP Readings from Last 3 Encounters:  01/05/18 108/75  12/27/17 124/64  11/18/17 (!) 149/61   Pulse Readings from Last 3 Encounters:  01/12/18 81  01/05/18 70  12/27/17 66   Constitutional: Well-developed, well-nourished, and in no distress.   HENT: Head: Normocephalic  and atraumatic.  Mouth/Throat: No oropharyngeal exudate. Mucosa moist. Eyes: Pupils are equal, round, and reactive to light. Conjunctivae are normal. No scleral icterus.  Neck: Normal range of motion. Neck supple. No JVD present.  Cardiovascular: Normal rate, regular rhythm and normal heart sounds.  Exam reveals no gallop and no friction  rub.   No murmur heard. Pulmonary/Chest: Effort normal and breath sounds normal. No respiratory distress. No wheezes.No rales.  Abdominal: Soft. Bowel sounds are normal. No distension. There is no tenderness. There is no guarding.  Musculoskeletal: No edema or tenderness.  Lymphadenopathy: No cervical, axillaryor supraclavicular adenopathy.  Neurological: Alert and oriented to person, place, and time. No cranial nerve deficit.  Skin: Skin is warm and dry. No rash noted. No erythema. No pallor.  Psychiatric: Affect and judgment normal.   Labs No visits with results within 3 Day(s) from this visit.  Latest known visit with results is:  Appointment on 01/06/2018  Component Date Value Ref Range Status  . WBC 01/06/2018 8.0  4.0 - 10.5 K/uL Final  . RBC 01/06/2018 4.25  3.87 - 5.11 MIL/uL Final  . Hemoglobin 01/06/2018 12.7  12.0 - 15.0 g/dL Final  . HCT 01/06/2018 38.5  36.0 - 46.0 % Final  . MCV 01/06/2018 90.6  78.0 - 100.0 fL Final  . MCH 01/06/2018 29.9  26.0 - 34.0 pg Final  . MCHC 01/06/2018 33.0  30.0 - 36.0 g/dL Final  . RDW 01/06/2018 14.1  11.5 - 15.5 % Final  . Platelets 01/06/2018 251  150 - 400 K/uL Final  . Neutrophils Relative % 01/06/2018 57  % Final  . Neutro Abs 01/06/2018 4.6  1.7 - 7.7 K/uL Final  . Lymphocytes Relative 01/06/2018 33  % Final  . Lymphs Abs 01/06/2018 2.6  0.7 - 4.0 K/uL Final  . Monocytes Relative 01/06/2018 7  % Final  . Monocytes Absolute 01/06/2018 0.6  0.1 - 1.0 K/uL Final  . Eosinophils Relative 01/06/2018 3  % Final  . Eosinophils Absolute 01/06/2018 0.2  0.0 - 0.7 K/uL Final  . Basophils Relative 01/06/2018 0  % Final  . Basophils Absolute 01/06/2018 0.0  0.0 - 0.1 K/uL Final   Performed at [facility], [address], [place], [place] [address]  . Sodium 01/06/2018 141  135 - 145 mmol/L Final  . Potassium 01/06/2018 3.6  3.5 - 5.1 mmol/L Final  . Chloride 01/06/2018 104  101 - 111 mmol/L Final  . CO2 01/06/2018 26  22 - 32 mmol/L Final    . Glucose, Bld 01/06/2018 105* 65 - 99 mg/dL Final  . BUN 01/06/2018 10  6 - 20 mg/dL Final  . Creatinine, Ser 01/06/2018 0.70  0.44 - 1.00 mg/dL Final  . Calcium 01/06/2018 9.2  8.9 - 10.3 mg/dL Final  . Total Protein 01/06/2018 7.4  6.5 - 8.1 g/dL Final  . Albumin 01/06/2018 4.1  3.5 - 5.0 g/dL Final  . AST 01/06/2018 18  15 - 41 U/L Final  . ALT 01/06/2018 21  14 - 54 U/L Final  . Alkaline Phosphatase 01/06/2018 64  38 - 126 U/L Final  . Total Bilirubin 01/06/2018 0.4  0.3 - 1.2 mg/dL Final  . GFR calc non Af Amer 01/06/2018 >60  >60 mL/min Final  . GFR calc Af Amer 01/06/2018 >60  >60 mL/min Final   Comment: (NOTE) The eGFR has been calculated using the CKD EPI equation. This calculation has not been validated in all clinical situations. eGFR's persistently <60 mL/min signify  possible Chronic Kidney Disease.   Georgiann Hahn gap 01/06/2018 11  5 - 15 Final   Performed at [facility], [address], [place], [place] [address]  . CEA 01/06/2018 3.6  0.0 - 4.7 ng/mL Final   Comment: (NOTE)                             Nonsmokers          <3.9                             Smokers             <5.6 [employer] Electrochemiluminescence Immunoassay (ECLIA) Values obtained with different assay methods or kits cannot be used interchangeably.  Results cannot be interpreted as absolute evidence of the presence or absence of malignant disease. Performed At: [facility] [place], [place] [id number] Rush Farmer MD [id number] Performed at [facility], [address] [place], [place] [address]   . Cancer Antigen (CA) 125 01/06/2018 14.5  0.0 - 38.1 U/mL Final   Comment: (NOTE) [employer] Electrochemiluminescence Immunoassay (ECLIA) Values obtained with different assay methods or kits cannot be used interchangeably.  Results cannot be interpreted as absolute evidence of the presence or absence of malignant disease. Performed At: [facility] [place], [place] [id number] Rush Farmer MD [id number] Performed at [facility], [address] [place], [place] [address]      Pathology No orders of the defined types were placed in this encounter.      Zoila Shutter MD

## 2018-01-12 NOTE — Patient Instructions (Signed)
[facility] at [facility]  Discharge Instructions:  You were seen by dr. higgs today.  _______________________________________________________________  Thank you for choosing [facility] at [facility] to provide your oncology and hematology care.  To afford each patient quality time with our providers, please arrive at least 15 minutes before your scheduled appointment.  You need to re-schedule your appointment if you arrive 10 or more minutes late.  We strive to give you quality time with our providers, and arriving late affects you and other patients whose appointments are after yours.  Also, if you no show three or more times for appointments you may be dismissed from the clinic.  Again, thank you for choosing [facility] at [facility]. Our hope is that these requests will allow you access to exceptional care and in a timely manner. _______________________________________________________________  If you have questions after your visit, please contact our office at [phone] between the hours of 8:30 a.m. and 5:00 p.m. Voicemails left after 4:30 p.m. will not be returned until the following business day. _______________________________________________________________  For prescription refill requests, have your pharmacy contact our office. _______________________________________________________________  Recommendations made by the consultant and any test results will be sent to your referring physician. _______________________________________________________________ 

## 2018-01-26 ENCOUNTER — Inpatient Hospital Stay: Payer: Medicare Other | Attending: Internal Medicine | Admitting: Internal Medicine

## 2018-01-26 VITALS — BP 92/60 | HR 74 | Temp 98.6°F | Resp 16 | Wt 170.0 lb

## 2018-01-26 DIAGNOSIS — Z8051 Family history of malignant neoplasm of kidney: Secondary | ICD-10-CM

## 2018-01-26 DIAGNOSIS — Z1509 Genetic susceptibility to other malignant neoplasm: Secondary | ICD-10-CM | POA: Insufficient documentation

## 2018-01-26 DIAGNOSIS — Z8542 Personal history of malignant neoplasm of other parts of uterus: Secondary | ICD-10-CM

## 2018-01-26 DIAGNOSIS — C189 Malignant neoplasm of colon, unspecified: Secondary | ICD-10-CM

## 2018-01-26 DIAGNOSIS — F418 Other specified anxiety disorders: Secondary | ICD-10-CM | POA: Diagnosis not present

## 2018-01-26 DIAGNOSIS — I700 Atherosclerosis of aorta: Secondary | ICD-10-CM | POA: Diagnosis not present

## 2018-01-26 DIAGNOSIS — Z85038 Personal history of other malignant neoplasm of large intestine: Secondary | ICD-10-CM | POA: Diagnosis not present

## 2018-01-26 DIAGNOSIS — K219 Gastro-esophageal reflux disease without esophagitis: Secondary | ICD-10-CM | POA: Insufficient documentation

## 2018-01-26 DIAGNOSIS — F17210 Nicotine dependence, cigarettes, uncomplicated: Secondary | ICD-10-CM | POA: Insufficient documentation

## 2018-01-26 DIAGNOSIS — Z90710 Acquired absence of both cervix and uterus: Secondary | ICD-10-CM | POA: Insufficient documentation

## 2018-01-26 DIAGNOSIS — Z801 Family history of malignant neoplasm of trachea, bronchus and lung: Secondary | ICD-10-CM | POA: Diagnosis not present

## 2018-01-26 DIAGNOSIS — J449 Chronic obstructive pulmonary disease, unspecified: Secondary | ICD-10-CM | POA: Diagnosis not present

## 2018-01-26 DIAGNOSIS — Z79899 Other long term (current) drug therapy: Secondary | ICD-10-CM | POA: Diagnosis not present

## 2018-01-26 DIAGNOSIS — Z90722 Acquired absence of ovaries, bilateral: Secondary | ICD-10-CM

## 2018-01-26 DIAGNOSIS — C541 Malignant neoplasm of endometrium: Secondary | ICD-10-CM

## 2018-01-26 DIAGNOSIS — G629 Polyneuropathy, unspecified: Secondary | ICD-10-CM | POA: Diagnosis not present

## 2018-01-26 DIAGNOSIS — Z9221 Personal history of antineoplastic chemotherapy: Secondary | ICD-10-CM | POA: Diagnosis not present

## 2018-01-26 MED ORDER — TRAMADOL HCL 50 MG PO TABS: 50.0000 mg | ORAL_TABLET | Freq: Every day | ORAL | 3 refills | Status: DC | PRN

## 2018-01-26 NOTE — Progress Notes (Signed)
[facility] CONSULT NOTE  Patient Care Team: System, Pcp Not In as PCP - General Penland, Kelby Fam, MD (Inactive) as PCP - Hematology/Oncology (Hematology and Oncology) Gala Romney Cristopher Estimable, MD as Consulting Physician (Gastroenterology)  CHIEF COMPLAINTS/PURPOSE OF CONSULTATION:  Endometrial cancer  #  Oncology History   # OCT 2015- Uterine ca- STAGE IV [multiple lung nodules]; Nov 2015S/p radical hysterectomy, BSO, pelvic lymph node dissection/vaginal biopsies/extensive pelvic disease including positive lymph nodes and positive distal vaginal biopsy.]; [[facility]]-endometroid G-2; LVI; positive Pelvic LN/vaginal Bx Jan-April 2016- carbo-Taxol x6 cycles [facility]; Dr.Penland]  # Oct 2016- colo- high grade dysplasia [Dr.Rourk; Dr.Jenkins s/p right hemi-colectomy]; last colo- may 2018;    [facility]Lynch syndrome  Histologic grade G2, histologic type endometrioid adenocarcinoma     Endometrial cancer ([place])   07/12/2014 Imaging    CT C/A/P, pelvic adenopathy, multiple pulmonary nodules concerning for metastatic disease      07/30/2014 Initial Diagnosis    Endometrial cancer      07/31/2014 Pathology Results    endometrioid adenocarcinoma, G2, lymphovascular invasion present, positive pelvic lymph node and vaginal biopsy      07/31/2014 Definitive Surgery    radical hysterectomy, BSO, pelvic lymph node dissection and vaginal biopsies      09/25/2014 Procedure    Port-A-Cath placement in IR      10/18/2014 - 01/10/2015 Chemotherapy    Carboplatin/Taxol. First cycle given at [facility].  S/P 6 cycles total, 5 cycles given at [facility].      07/04/2015 Procedure    Colonoscopy by Dr. Gala Romney      07/04/2015 Pathology Results    Colon, polyp(s), vicinity of hepatic flexure - TUBULAR ADENOMA WITH FOCAL HIGH GRADE DYSPLASIA.       Procedure    Partial colectomy by Dr. Arnoldo Morale scheduled for 08/30/2015      10/31/2015 Genetic Testing    [facility] SYNDROME MSH6  Mutation      04/27/2016 Imaging    CT CAP- No acute process or evidence of metastatic disease in the chest. Right middle lobe pulmonary nodule is unchanged back to 11/20/2014, favoring a benign etiology      11/10/2016 Imaging    CT CAP- 1. No evidence of metastatic disease in the chest, abdomen or pelvis. 2. No evidence of local tumor recurrence at the ileocolic anastomosis in the right abdomen or in the pelvis. Decreased mild fat stranding at the base of the right mesentery, most consistent with postsurgical scarring. 3. Aortic atherosclerosis.       Adenocarcinoma of colon ([place])   07/04/2015 Pathology Results    Diagnosis 1. Colon, polyp(s), vicinity of hepatic flexure - TUBULAR ADENOMA WITH FOCAL HIGH GRADE DYSPLASIA. - NO INVASIVE CARCINOMA. 2. Colon, polyp(s), sigmoid - HYPERPLASTIC POLYP. - NO DYSPLASIA OR MALIGNANCY.      07/04/2015 Procedure    Colonoscopoy by Dr. Gala Romney- large polypoid colonic lesion.      08/30/2015 Definitive Surgery    Dr. Arnoldo Morale- right segmental colon resection      08/30/2015 Pathology Results    [id number] intramucosal adenocarcinoma of colon, 0.2 cm inding the laminal propria with negative resection margins and 0/17 lymph nodes.        HISTORY OF PRESENTING ILLNESS:  Julia Osborne 57 y.o.  female history of endometrial cancer stage IV who has been followed at [facility] is here to establish care/given proximity.  I reviewed the previous records in detail; summarized above.  Patient was diagnosed with  stage IV endometrial cancer endometrioid type with multiple lung nodules of presentation.  Patient had radical hysterectomy bilateral salpingo-nephrectomy; pelvic lymph dissection.  She went on to have adjuvant chemotherapy carbotaxol x6 cycles.  Patient had significant response noted on the CT scan.  Patient also had a screening colonoscopy; given the diagnosis of Lynch syndrome-had a high-grade polyp/dysplastic polyp-for which she had  left in the colectomy.   Patient complains of Chronic tingling and numbness of hand and feet.  She has been taking tramadol which seems to help the pain/also Lyrica.    Review of Systems  Constitutional: Negative for chills, diaphoresis, fever, malaise/fatigue and weight loss.  HENT: Negative for nosebleeds and sore throat.   Eyes: Negative for double vision.  Respiratory: Negative for cough, hemoptysis, sputum production, shortness of breath and wheezing.   Cardiovascular: Negative for chest pain, palpitations, orthopnea and leg swelling.  Gastrointestinal: Negative for abdominal pain, blood in stool, constipation, diarrhea, heartburn, melena, nausea and vomiting.  Genitourinary: Negative for dysuria, frequency and urgency.  Musculoskeletal: Negative for back pain and joint pain.  Skin: Negative.  Negative for itching and rash.  Neurological: Positive for tingling. Negative for dizziness, focal weakness, weakness and headaches.  Endo/Heme/Allergies: Does not bruise/bleed easily.  Psychiatric/Behavioral: Negative for depression. The patient is not nervous/anxious and does not have insomnia.      MEDICAL HISTORY:  Past Medical History:  Diagnosis Date  . Adenocarcinoma of colon ([place]) 09/13/2015  . Anemia   . Anxiety   . Asthma   . Cancer ([place])    endometrial; cancer cells in intestine  . COPD (chronic obstructive pulmonary disease) ([place])    no definite diagnosis  . Depression   . Diverticulitis   . Dyspareunia 05/16/2015  . Family history of cancer   . Family history of kidney cancer   . GERD (gastroesophageal reflux disease)   . Indigestion   . Lynch syndrome   . Neuropathy    feet and hands  . Uterine fibroid   . Vaginal dryness 05/16/2015  . Vaginal itching 07/16/2015  . Vaginal Pap smear, abnormal     SURGICAL HISTORY: Past Surgical History:  Procedure Laterality Date  . ABDOMINAL HYSTERECTOMY    . APPENDECTOMY    . BIOPSY N/A 03/14/2015   Procedure: BIOPSY;   Surgeon: Daneil Dolin, MD;  Location: [facility] ORS;  Service: Endoscopy;  Laterality: N/A;  Gastric  . COLONOSCOPY N/A 01/28/2017   Procedure: COLONOSCOPY;  Surgeon: Daneil Dolin, MD;  Location: [facility] END[facility] SUITE;  Service: Endoscopy;  Laterality: N/A;  11:30am  . COLONOSCOPY WITH PROPOFOL N/A 03/14/2015   RMR: Internal hemorrhoids. colonic diverticulosis. Incomplete examination. Prepartation inadequate.  . COLONOSCOPY WITH PROPOFOL N/A 07/04/2015   RMR: Colonic diverticulosis . Large polypoid lesion in the vicinity of the hepatic flexure status post saline-assisted piecmeal snare polypectomy  with ablation and tattooing as described. Sigmoid polyp removed as described above. sigmoid colon polyp hyperplastic, hepatic flexure polyp with TA with focal high grade dysplasia   . ESOPHAGEAL DILATION N/A 03/14/2015   Procedure: ESOPHAGEAL DILATION;  Surgeon: Daneil Dolin, MD;  Location: [facility];  Service: Endoscopy;  Laterality: N/A;  [place] 62  . ESOPHAGOGASTRODUODENOSCOPY N/A 05/19/2016   Procedure: ESOPHAGOGASTRODUODENOSCOPY (EGD);  Surgeon: Daneil Dolin, MD;  Location: [facility];  Service: Endoscopy;  Laterality: N/A;  215  . ESOPHAGOGASTRODUODENOSCOPY N/A 01/28/2017   Procedure: ESOPHAGOGASTRODUODENOSCOPY (EGD);  Surgeon: Daneil Dolin, MD;  Location: [facility];  Service: Endoscopy;  Laterality: N/A;  .  ESOPHAGOGASTRODUODENOSCOPY (EGD) WITH PROPOFOL N/A 03/14/2015   RMR: Mild erosive reflux esophagitis status post passage o f a [place] dilator. Abnormal gastric mucosa of uncertain significance as described above. status post biopsy, benign  . ESOPHAGOGASTRODUODENOSCOPY (EGD) WITH PROPOFOL N/A 11/18/2017   Procedure: ESOPHAGOGASTRODUODENOSCOPY (EGD) WITH PROPOFOL;  Surgeon: Daneil Dolin, MD;  [facility];  Service: Endoscopy;  Laterality: N/A;  12:15pm  . MALONEY DILATION N/A 05/19/2016   Procedure: [name] DILATION;  Surgeon: Daneil Dolin, MD;  [facility];  Service:  Endoscopy;  Laterality: N/A;  . [name] DILATION N/A 01/28/2017   Procedure: [name] DILATION;  Surgeon: Daneil Dolin, MD;  [facility];  Service: Endoscopy;  Laterality: N/A;  [name] DILATION N/A 11/18/2017   Procedure: [name] DILATION;  Surgeon: Daneil Dolin, MD;  [facility];  Service: Endoscopy;  Laterality: N/A;  . PARTIAL COLECTOMY  08/30/2015   polyp with adenocarcinoma  . POLYPECTOMY N/A 07/04/2015   Procedure: POLYPECTOMY;  Surgeon: Daneil Dolin, MD;  Location: [facility];  Service: Endoscopy;  Laterality: N/A;  . PORTACATH PLACEMENT Right 09/2014  . TUBAL LIGATION      SOCIAL HISTORY[place]; smoking- 1/2 ppd; no alcohol; used to run machines;  Social History   Socioeconomic History  . Marital status: Married    Spouse name: Not on file  . Number of children: 2  . Years of education: Not on file  . Highest education level: Not on file  Occupational History  . Not on file  Social Needs  . Financial resource strain: Not on file  . Food insecurity:    Worry: Not on file    Inability: Not on file  . Transportation needs:    Medical: Not on file    Non-medical: Not on file  Tobacco Use  . Smoking status: Current Every Day Smoker    Packs/day: 0.25    Years: 25.00    Pack years: 6.25    Types: Cigarettes  . Smokeless tobacco: Never Used  . Tobacco comment: Is trying to quit/wean off.  Substance and Sexual Activity  . Alcohol use: No  . Drug use: No  . Sexual activity: Not Currently    Birth control/protection: Surgical    Comment: hyst  Lifestyle  . Physical activity:    Days per week: Not on file    Minutes per session: Not on file  . Stress: Not on file  Relationships  . Social connections:    Talks on phone: Not on file    Gets together: Not on file    Attends religious service: Not on file    Active member of club or organization: Not on file    Attends meetings of clubs or organizations: Not on file     Relationship status: Not on file  . Intimate partner violence:    Fear of current or ex partner: Not on file    Emotionally abused: Not on file    Physically abused: Not on file    Forced sexual activity: Not on file  Other Topics Concern  . Not on file  Social History Narrative  . Not on file    FAMILY HISTORY: Dad- MI; ? Cancer on autopsy; sisters/aunts- brain; lung cancer x2; oldest sister- kidney cancer Family History  Problem Relation Age of Onset  . Other Mother        clot that went to heart, deceased age 35ss  . Heart attack  Father        age 45s, deceased  . Stroke Father   . Cancer Father        "at death determined he was ate up with cancer"  . Hypertension Sister   . Kidney cancer Sister 37  . Diabetes Brother   . Hypertension Brother   . Endometriosis Daughter   . Heart attack Maternal Grandfather   . Diabetes Sister   . Brain cancer Paternal Aunt   . Cancer Paternal Uncle        NOS  . Cancer Paternal Uncle        NOS  . Cancer Paternal Uncle        NOS  . Colon cancer Neg Hx     ALLERGIES:  is allergic to codeine.  MEDICATIONS:  Current Outpatient Medications  Medication Sig Dispense Refill  . albuterol (PROVENTIL HFA;VENTOLIN HFA) 108 (90 Base) MCG/ACT inhaler Inhale 2 puffs into the lungs every 2 (two) hours as needed for wheezing or shortness of breath (cough). 1 Inhaler 3  . albuterol (PROVENTIL) (2.5 MG/3ML) 0.083% nebulizer solution Take 3 mLs (2.5 mg total) by nebulization every 6 (six) hours as needed for wheezing or shortness of breath. 75 mL 12  . ALPRAZolam (XANAX) 0.25 MG tablet Take 0.125 mg by mouth daily as needed for anxiety.     . citalopram (CELEXA) 40 MG tablet Take 40 mg by mouth at bedtime.  2  . LINZESS 145 MCG CAPS capsule TAKE 1 CAPSULE (145 MCG TOTAL) BY MOUTH DAILY BEFORE BREAKFAST. 90 capsule 3  . loratadine (CLARITIN) 10 MG tablet Take 10 mg by mouth daily as needed for allergies.     Marland Kitchen LYRICA 75 MG capsule TAKE 1 CAPSULE BY  MOUTH EVERYDAY AT BEDTIME (Patient taking differently: TAKE 75 MG BY MOUTH EVERYDAY AT BEDTIME) 30 capsule 2  . Naphazoline-Pheniramine (ALLERGY EYE OP) Place 1-2 drops into both eyes 2 (two) times daily as needed (ITCHY, WATERY EYES).    Marland Kitchen sucralfate (CARAFATE) 1 GM/10ML suspension Take 10 mLs (1 g total) by mouth 4 (four) times daily. (Patient taking differently: Take 1 g by mouth 2 (two) times daily. ) 420 mL 1  . DEXILANT 60 MG capsule TAKE 1 CAPSULE BY MOUTH EVERY DAY (Patient not taking: Reported on 01/26/2018) 90 capsule 3  . diazepam (VALIUM) 5 MG tablet Take 1 tablet (5 mg total) by mouth every 8 (eight) hours as needed for muscle spasms. (Patient not taking: Reported on 10/22/2017) 20 tablet 0  . ondansetron (ZOFRAN) 8 MG tablet Take 8 mg by mouth daily as needed (CAR SICKNESS).    . phenazopyridine (PYRIDIUM) 200 MG tablet Take 1 tablet (200 mg total) by mouth 3 (three) times daily as needed for pain. (Patient not taking: Reported on 01/26/2018) 20 tablet 0  . sodium chloride (OCEAN) 0.65 % SOLN nasal spray Place 1 spray into both nostrils as needed for congestion.    . traMADol (ULTRAM) 50 MG tablet Take 1 tablet (50 mg total) by mouth daily as needed. 30 tablet 3  . traZODone (DESYREL) 50 MG tablet Take 50 mg by mouth at bedtime.     No current facility-administered medications for this visit.       Marland Kitchen  PHYSICAL EXAMINATION: ECOG PERFORMANCE STATUS: 0 - Asymptomatic  Vitals:   01/26/18 1508  BP: 92/60  Pulse: 74  Resp: 16  Temp: 98.6 F (37 C)   Filed Weights   01/26/18 1508  Weight: 170 lb (77.1  kg)    GENERAL: Well-nourished well-developed; Alert, no distress and comfortable.  Alone.  EYES: no pallor or icterus OROPHARYNX: no thrush or ulceration; NECK: supple; no lymph nodes felt. LYMPH:  no palpable lymphadenopathy in the axillary or inguinal regions LUNGS: Decreased breath sounds auscultation bilaterally. No wheeze or crackles HEART/CVS: regular rate & rhythm and no  murmurs; No lower extremity edema ABDOMEN:abdomen soft, non-tender and normal bowel sounds. No hepatomegaly or splenomegaly.  Musculoskeletal:no cyanosis of digits and no clubbing  PSYCH: alert & oriented x 3 with fluent speech NEURO: no focal motor/sensory deficits SKIN:  no rashes or significant lesions  LABORATORY DATA:  I have reviewed the data as listed Lab Results  Component Value Date   WBC 8.0 01/06/2018   HGB 12.7 01/06/2018   HCT 38.5 01/06/2018   MCV 90.6 01/06/2018   PLT 251 01/06/2018   Recent Labs    09/04/17 0937 09/27/17 2349 01/06/18 1103  NA 136 138 141  K 4.4 3.8 3.6  CL 103 103 104  CO2 _0 GLUCOSE 115* 108* 105*  BUN _1 CREATININE 0.92 0.95 0.70  CALCIUM 9.3 9.3 9.2  GFRNONAA >60 >60 >60  GFRAA >60 >60 >60  PROT 7.1 7.5 7.4  ALBUMIN 4.0 4.2 4.1  AST _2 ALT _3 ALKPHOS 59 55 64  BILITOT 0.5 0.3 0.4  BILIDIR  --  0.1  --   IBILI  --  0.2*  --     RADIOGRAPHIC STUDIES: I have personally reviewed the radiological images as listed and agreed with the findings in the report. No results found.  ASSESSMENT & PLAN:   Endometrial cancer ([place]) # Uterine Cancer endometrioid type grade 2; stage IV [lung nodules at presentation 2015/]-currently no evidence of recurrence; recent CT scan chest January 2019-no evidence of disease.  CT abdomen pelvis April 2018 NED.  # history of high-grade polyp/status post colectomy: given the history of Lynch syndrome-patient needs intensive surveillance colonoscopies; last colonoscopy-May 2018.  Patient awaiting appointment with GI at the end of this month with Dr. Vira Agar.   # Active smoker smoking cessation/ program quit smoking.   # Lynch syndrome-discussed the family implications of lung syndrome; patient currently has a 11 year old son [hardheaded]; daughter 75 years.  Not screened.  Discussed with the patient to talk to the family regarding screening for Lynch syndrome.  Recommend: Annual  skin exam; EGD every 2 to 3 years; UA/cytology  # PN- G-23/ now- 1; contonue tranadol 50 mg evry day prn;   # follow up in 6 months/labs-UA; refer to gyn-oncology.  # # 60 minutes face-to-face with the patient discussing the above plan of care; more than 50% of time spent on prognosis/ natural history; counseling and coordination. .   All questions were answered. The patient knows to call the clinic with any problems, questions or concerns.     Cammie Sickle, MD 01/29/2018 7:53 AM

## 2018-01-26 NOTE — Assessment & Plan Note (Addendum)
#   Uterine Cancer endometrioid type grade 2; stage IV [lung nodules at presentation 2015/]-currently no evidence of recurrence; recent CT scan chest January 2019-no evidence of disease.  CT abdomen pelvis April 2018 NED.  # history of high-grade polyp/status post colectomy: given the history of Lynch syndrome-patient needs intensive surveillance colonoscopies; last colonoscopy-May 2018.  Patient awaiting appointment with GI at the end of this month with Dr. Elliott.   # Active smoker smoking cessation/ program quit smoking.   # Lynch syndrome-discussed the family implications of lung syndrome; patient currently has a 36-year-old son [hardheaded]; daughter 33 years.  Not screened.  Discussed with the patient to talk to the family regarding screening for Lynch syndrome.  Recommend: Annual skin exam; EGD every 2 to 3 years; UA/cytology  # PN- G-23/ now- 1; contonue tranadol 50 mg evry day prn;   # follow up in 6 months/labs-UA; refer to gyn-oncology.  # # 60 minutes face-to-face with the patient discussing the above plan of care; more than 50% of time spent on prognosis/ natural history; counseling and coordination. . 

## 2018-02-09 ENCOUNTER — Inpatient Hospital Stay: Payer: Medicare Other

## 2018-02-16 ENCOUNTER — Inpatient Hospital Stay: Payer: Medicare Other

## 2018-02-22 DIAGNOSIS — H40022 Open angle with borderline findings, high risk, left eye: Secondary | ICD-10-CM | POA: Diagnosis not present

## 2018-02-22 DIAGNOSIS — H2513 Age-related nuclear cataract, bilateral: Secondary | ICD-10-CM | POA: Diagnosis not present

## 2018-02-22 DIAGNOSIS — H401121 Primary open-angle glaucoma, left eye, mild stage: Secondary | ICD-10-CM | POA: Diagnosis not present

## 2018-02-22 DIAGNOSIS — H401112 Primary open-angle glaucoma, right eye, moderate stage: Secondary | ICD-10-CM | POA: Diagnosis not present

## 2018-02-23 ENCOUNTER — Telehealth: Payer: Self-pay

## 2018-02-23 NOTE — Telephone Encounter (Signed)
Julia Osborne did not show for her appointment 5/29 with Gyn Onc. Scheduling message sent to contact her for reschedule. Oncology Nurse Navigator Documentation  Navigator Location: [facility] (02/23/18 1300)   )Navigator Encounter Type: Other (02/23/18 1300)                                                    Time Spent with Patient: 15 (02/23/18 1300)

## 2018-03-09 ENCOUNTER — Inpatient Hospital Stay: Payer: Medicare Other | Attending: Obstetrics and Gynecology | Admitting: Obstetrics and Gynecology

## 2018-03-09 VITALS — BP 112/62 | HR 72 | Temp 98.3°F | Resp 18 | Ht 63.0 in | Wt 163.8 lb

## 2018-03-09 DIAGNOSIS — Z1509 Genetic susceptibility to other malignant neoplasm: Secondary | ICD-10-CM

## 2018-03-09 DIAGNOSIS — C541 Malignant neoplasm of endometrium: Secondary | ICD-10-CM | POA: Insufficient documentation

## 2018-03-09 NOTE — Progress Notes (Signed)
Nights sweats since her husterectomy. HA long term that she thinks is from glaucoma. No gyn sx.

## 2018-03-09 NOTE — Progress Notes (Signed)
Gynecologic Oncology Consult Visit   Referring Provider:  Dr. Rogue Bussing  Chief Concern: Endometrial cancer  Subjective:  Julia Osborne is a 57 y.o. female who is seen in consultation from Dr. Walden Field for endometrial cancer surveillance.  She has Lynch Syndrome with MSH6 mutation and developed uterine cancer, endometrioid grade 2; stage IV with lung nodules in 2015.  Had surgery TAH/BSO, nodes and vaginal bx 11/15 with Dr Hessie Dibble at [facility] in [place].  Then got six cycles of carboplatin/taxol at [facility].  Currently no evidence of recurrence; recent CT scan chest January 2019-no evidence of disease.  CT abdomen pelvis April 2018 NED.  She has a history of high-grade polyp/status post colectomy: given the history of Lynch syndrome-patient needs intensive surveillance colonoscopies; last colonoscopy-May 2018.  Patient awaiting appointment with GI at the end of this month with Dr. Vira Agar in GI.   Saw Dr Rogue Bussing in May for surveillance and Gyn Onc referral also made to see [place].  Problem List: Patient Active Problem List   Diagnosis Date Noted  . Abdominal pain 12/21/2016  . Lynch syndrome 10/31/2015  . Genetic testing 10/21/2015  . Abdominal pain, left upper quadrant 10/17/2015  . Family history of cancer   . Family history of kidney cancer   . Adenocarcinoma of colon ([place]) 09/13/2015  . Colon neoplasm 08/30/2015  . Vaginal itching 07/16/2015  . History of colonic polyps   . 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 drug ([place]) 06/28/2015  . Constipation 06/05/2015  . Abnormal CT scan, sigmoid colon 06/05/2015  . Vaginal dryness 05/16/2015  . Dyspareunia 05/16/2015  . Mucosal abnormality of stomach   . Reflux esophagitis   . Dysphagia   . Hematochezia   . Diverticulosis of colon without hemorrhage   . Rectal bleeding 02/14/2015  . GERD (gastroesophageal reflux disease) 02/14/2015  . Esophageal dysphagia 02/14/2015  . Abdominal pain, epigastric 02/14/2015  . Nausea  with vomiting 02/14/2015  . Endometrial cancer ([place]) 10/09/2014  . Conversion reaction   . Depression   . Aphasia 09/11/2014    Past Medical History: Past Medical History:  Diagnosis Date  . Adenocarcinoma of colon ([place]) 09/13/2015  . Anemia   . Anxiety   . Asthma   . Cancer ([place])    endometrial; cancer cells in intestine  . COPD (chronic obstructive pulmonary disease) ([place])    no definite diagnosis  . Depression   . Diverticulitis   . Dyspareunia 05/16/2015  . Family history of cancer   . Family history of kidney cancer   . GERD (gastroesophageal reflux disease)   . Indigestion   . Lynch syndrome   . Neuropathy    feet and hands  . Uterine fibroid   . Vaginal dryness 05/16/2015  . Vaginal itching 07/16/2015  . Vaginal Pap smear, abnormal     Past Surgical History: Past Surgical History:  Procedure Laterality Date  . ABDOMINAL HYSTERECTOMY    . APPENDECTOMY    . BIOPSY N/A 03/14/2015   Procedure: BIOPSY;  Surgeon: Daneil Dolin, MD;  Location: [facility];  Service: Endoscopy;  Laterality: N/A;  Gastric  . COLONOSCOPY N/A 01/28/2017   Procedure: COLONOSCOPY;  Surgeon: Daneil Dolin, MD;  Location: [facility];  Service: Endoscopy;  Laterality: N/A;  11:30am  . COLONOSCOPY WITH PROPOFOL N/A 03/14/2015   RMR: Internal hemorrhoids. colonic diverticulosis. Incomplete examination. Prepartation inadequate.  . COLONOSCOPY WITH PROPOFOL N/A 07/04/2015   RMR: Colonic diverticulosis . Large polypoid lesion in the vicinity of the  hepatic flexure status post saline-assisted piecmeal snare polypectomy  with ablation and tattooing as described. Sigmoid polyp removed as described above. sigmoid colon polyp hyperplastic, hepatic flexure polyp with TA with focal high grade dysplasia   . ESOPHAGEAL DILATION N/A 03/14/2015   Procedure: ESOPHAGEAL DILATION;  Surgeon: Daneil Dolin, MD;  Location: [facility];  Service: Endoscopy;  Laterality: N/A;  Mal[place] 43  . ESOPHAGOGASTRODUODENOSCOPY N/A  05/19/2016   Procedure: ESOPHAGOGASTRODUODENOSCOPY (EGD);  Surgeon: Daneil Dolin, MD;  [facility]: [facility];  Service: Endoscopy;  Laterality: N/A;  215  . ESOPHAGOGASTRODUODENOSCOPY N/A 01/28/2017   Procedure: ESOPHAGOGASTRODUODENOSCOPY (EGD);  Surgeon: Daneil Dolin, MD;  [facility];  Service: Endoscopy;  Laterality: N/A;  . ESOPHAGOGASTRODUODENOSCOPY (EGD) WITH PROPOFOL N/A 03/14/2015   RMR: Mild erosive reflux esophagitis status post passage o f a [place] dilator. Abnormal gastric mucosa of uncertain significance as described above. status post biopsy, benign  . ESOPHAGOGASTRODUODENOSCOPY (EGD) WITH PROPOFOL N/A 11/18/2017   Procedure: ESOPHAGOGASTRODUODENOSCOPY (EGD) WITH PROPOFOL;  Surgeon: Daneil Dolin, MD;  [facility];  Service: Endoscopy;  Laterality: N/A;  12:15pm  . MALONEY DILATION N/A 05/19/2016   Procedure: [name] DILATION;  Surgeon: Daneil Dolin, MD;  [facility];  Service: Endoscopy;  Laterality: N/A;  . [name] DILATION N/A 01/28/2017   Procedure: [name] DILATION;  Surgeon: Daneil Dolin, MD;  [facility];  Service: Endoscopy;  Laterality: N/A;  [name] DILATION N/A 11/18/2017   Procedure: [name] DILATION;  Surgeon: Daneil Dolin, MD;  [facility];  Service: Endoscopy;  Laterality: N/A;  . PARTIAL COLECTOMY  08/30/2015   polyp with adenocarcinoma  . POLYPECTOMY N/A 07/04/2015   Procedure: POLYPECTOMY;  Surgeon: Daneil Dolin, MD;  Location: [facility];  Service: Endoscopy;  Laterality: N/A;  . PORTACATH PLACEMENT Right 09/2014  . TUBAL LIGATION       OB History:  OB History  Gravida Para Term Preterm AB Living  '2 2       2  ' SAB TAB Ectopic Multiple Live Births               # Outcome Date GA Lbr Len/2nd Weight Sex Delivery Anes PTL Lv  2 Para           1 Para             Family History: Family History  Problem Relation Age of Onset  . Other Mother        clot that went to heart, deceased age  28ss  . Heart attack Father        age 65s, deceased  . Stroke Father   . Cancer Father        "at death determined he was ate up with cancer"  . Hypertension Sister   . Kidney cancer Sister 35  . Diabetes Brother   . Hypertension Brother   . Endometriosis Daughter   . Heart attack Maternal Grandfather   . Diabetes Sister   . Brain cancer Paternal Aunt   . Cancer Paternal Uncle        NOS  . Cancer Paternal Uncle        NOS  . Cancer Paternal Uncle        NOS  . Colon cancer Neg Hx     Social History: Social History   Socioeconomic History  . Marital status: Married    Spouse name: Not on file  . Number of  children: 2  . Years of education: Not on file  . Highest education level: Not on file  Occupational History  . Not on file  Social Needs  . Financial resource strain: Not on file  . Food insecurity:    Worry: Not on file    Inability: Not on file  . Transportation needs:    Medical: Not on file    Non-medical: Not on file  Tobacco Use  . Smoking status: Current Every Day Smoker    Packs/day: 0.25    Years: 25.00    Pack years: 6.25    Types: Cigarettes  . Smokeless tobacco: Never Used  . Tobacco comment: Is trying to quit/wean off.  Substance and Sexual Activity  . Alcohol use: No  . Drug use: No  . Sexual activity: Not Currently    Birth control/protection: Surgical    Comment: hyst  Lifestyle  . Physical activity:    Days per week: Not on file    Minutes per session: Not on file  . Stress: Not on file  Relationships  . Social connections:    Talks on phone: Not on file    Gets together: Not on file    Attends religious service: Not on file    Active member of club or organization: Not on file    Attends meetings of clubs or organizations: Not on file    Relationship status: Not on file  . Intimate partner violence:    Fear of current or ex partner: Not on file    Emotionally abused: Not on file    Physically abused: Not on file    Forced  sexual activity: Not on file  Other Topics Concern  . Not on file  Social History Narrative  . Not on file    Allergies: Allergies  Allergen Reactions  . Codeine Rash    Current Medications: Current Outpatient Medications  Medication Sig Dispense Refill  . albuterol (PROVENTIL HFA;VENTOLIN HFA) 108 (90 Base) MCG/ACT inhaler Inhale 2 puffs into the lungs every 2 (two) hours as needed for wheezing or shortness of breath (cough). 1 Inhaler 3  . albuterol (PROVENTIL) (2.5 MG/3ML) 0.083% nebulizer solution Take 3 mLs (2.5 mg total) by nebulization every 6 (six) hours as needed for wheezing or shortness of breath. 75 mL 12  . ALPRAZolam (XANAX) 0.25 MG tablet Take 0.125 mg by mouth daily as needed for anxiety.     . citalopram (CELEXA) 40 MG tablet Take 40 mg by mouth at bedtime.  2  . DEXILANT 60 MG capsule TAKE 1 CAPSULE BY MOUTH EVERY DAY 90 capsule 3  . LINZESS 145 MCG CAPS capsule TAKE 1 CAPSULE (145 MCG TOTAL) BY MOUTH DAILY BEFORE BREAKFAST. 90 capsule 3  . loratadine (CLARITIN) 10 MG tablet Take 10 mg by mouth daily as needed for allergies.     Marland Kitchen LYRICA 75 MG capsule TAKE 1 CAPSULE BY MOUTH EVERYDAY AT BEDTIME (Patient taking differently: TAKE 75 MG BY MOUTH EVERYDAY AT BEDTIME) 30 capsule 2  . Naphazoline-Pheniramine (ALLERGY EYE OP) Place 1-2 drops into both eyes 2 (two) times daily as needed (ITCHY, WATERY EYES).    . ondansetron (ZOFRAN) 8 MG tablet Take 8 mg by mouth daily as needed (CAR SICKNESS).    . sodium chloride (OCEAN) 0.65 % SOLN nasal spray Place 1 spray into both nostrils as needed for congestion.    . sucralfate (CARAFATE) 1 GM/10ML suspension Take 10 mLs (1 g total) by mouth 4 (four) times  daily. (Patient taking differently: Take 1 g by mouth 2 (two) times daily. ) 420 mL 1  . traMADol (ULTRAM) 50 MG tablet Take 1 tablet (50 mg total) by mouth daily as needed. 30 tablet 3  . traZODone (DESYREL) 50 MG tablet Take 50 mg by mouth at bedtime.    . diazepam (VALIUM) 5 MG  tablet Take 1 tablet (5 mg total) by mouth every 8 (eight) hours as needed for muscle spasms. (Patient not taking: Reported on 10/22/2017) 20 tablet 0  . phenazopyridine (PYRIDIUM) 200 MG tablet Take 1 tablet (200 mg total) by mouth 3 (three) times daily as needed for pain. (Patient not taking: Reported on 01/26/2018) 20 tablet 0   No current facility-administered medications for this visit.     Review of Systems General: negative for, fevers, chills, fatigue, changes in sleep, changes in weight or appetite Skin: negative for changes in color, texture, moles or lesions Eyes: negative for, changes in vision, pain, diplopia HEENT: negative for, change in hearing, pain, discharge, tinnitus, vertigo, voice changes, sore throat, neck masses Breasts: negative for breast lumps Pulmonary: negative for, dyspnea, orthopnea, productive cough Cardiac: negative for, palpitations, syncope, pain, discomfort, pressure Gastrointestinal: negative for, dysphagia, nausea, vomiting, jaundice, pain, constipation, diarrhea, hematemesis, hematochezia Genitourinary/Sexual: negative for, dysuria, discharge, hesitancy, nocturia, retention, stones, infections, STD's, incontinence Ob/Gyn: negative for, irregular bleeding, pain Musculoskeletal: negative for, pain, stiffness, swelling, range of motion limitation Hematology: negative for, easy bruising, bleeding Neurologic/Psych: negative for, headaches, seizures, paralysis, weakness, tremor, change in gait, change in sensation, mood swings, depression, anxiety, change in memory  Objective:  Physical Examination:  BP 112/62   Pulse 72   Temp 98.3 F (36.8 C) (Tympanic)   Resp 18   Ht '5\' 3"'  (1.6 m)   Wt 163 lb 12.8 oz (74.3 kg)   BMI 29.02 kg/m    ECOG Performance Status: 0 - Asymptomatic  General appearance: alert, cooperative and appears stated age HEENT:oropharynx clear, no lesions and thyroid without masses Lymph node survey: non-palpable, axillary, inguinal,  supraclavicular Cardiovascular: regular rate and rhythm, no murmurs or gallops Respiratory: normal air entry, lungs clear to auscultation and no rales, rhonchi or wheezing Breast exam: not examined. Abdomen: soft, non-tender, without masses or organomegaly, no hernias and well healed incision Back: inspection of back is normal Extremities: extremities normal, atraumatic, no cyanosis or edema Skin exam - normal coloration and turgor, no rashes, no suspicious skin lesions noted. Neurological exam reveals alert, oriented, normal speech, no focal findings or movement disorder noted.  Pelvic: exam chaperoned by nurse;  Vulva: normal appearing vulva with no masses, tenderness or lesions; Vagina: normal vagina; Bimanual: normal; Rectal: not indicated and normal rectal, no masses   Assessment:  Julia Osborne is a 57 y.o. female with Lynch Syndrome and stage IV grade 2 endometrial cancer diagnosed 11/15 s/p TAH/BSO nodes who presented with lung metastases.  NED since 6 cycles of carboplatin/taxol chemotherapy.    She had partial colectomy for high grade polyp in past.   Medical co-morbidities complicating care:   Plan:   Problem List Items Addressed This Visit      Genitourinary   Endometrial cancer ([place]) - Primary     We discussed options for management including close surveillance for recurrence of endometrial cancer with Gyn Onc and Med Onc.   Also reinforced need for surveillance for other Lynch associated cancers, most notably colorectal cancer.  She will see Dr Vira Agar in GI later this month. Asked her to clarify with  him whether colonoscopy should be done every year or every three years.  Up to Date suggests annual screening interval. She had colonoscopy just over a year ago.  Urine cytology will be ordered today.  We can see her back for follow up in 6 months or sooner if any symptoms arise.     The patient's diagnosis, an outline of the further diagnostic and laboratory studies which  will be required, the recommendation, and alternatives were discussed.  All questions were answered to the patient's satisfaction.  A total of 40 minutes were spent with the patient/family today; 40% was spent in education, counseling and coordination of care for endometrial cancer.    Mellody Drown, MD  CC:  Zoila Shutter, MD [address] [place], [place] [address] [phone]

## 2018-03-11 LAB — CYTOLOGY - NON PAP

## 2018-04-28 DIAGNOSIS — F3341 Major depressive disorder, recurrent, in partial remission: Secondary | ICD-10-CM | POA: Diagnosis not present

## 2018-05-24 DIAGNOSIS — H40052 Ocular hypertension, left eye: Secondary | ICD-10-CM | POA: Diagnosis not present

## 2018-05-24 DIAGNOSIS — H401112 Primary open-angle glaucoma, right eye, moderate stage: Secondary | ICD-10-CM | POA: Diagnosis not present

## 2018-05-24 DIAGNOSIS — H2513 Age-related nuclear cataract, bilateral: Secondary | ICD-10-CM | POA: Diagnosis not present

## 2018-05-24 DIAGNOSIS — H401121 Primary open-angle glaucoma, left eye, mild stage: Secondary | ICD-10-CM | POA: Diagnosis not present

## 2018-06-22 ENCOUNTER — Other Ambulatory Visit: Payer: Self-pay | Admitting: Oncology

## 2018-06-22 ENCOUNTER — Other Ambulatory Visit: Payer: Self-pay | Admitting: Internal Medicine

## 2018-06-22 DIAGNOSIS — C541 Malignant neoplasm of endometrium: Secondary | ICD-10-CM

## 2018-06-22 DIAGNOSIS — C189 Malignant neoplasm of colon, unspecified: Secondary | ICD-10-CM

## 2018-06-22 NOTE — Progress Notes (Signed)
Patient called cancer center requesting refill of  Tramadol 50 mg daily.   [place] Cont[place] Sub[place] Reporting System reviewed and refill is appropriate on or after 06/24/18.  Medication e-scribed to her pharmacy ([facility]) using [employer]'s 2-step verification process.    [place]:     Faythe Casa, NP 06/22/2018 1:53 PM [phone]

## 2018-06-23 DIAGNOSIS — H401112 Primary open-angle glaucoma, right eye, moderate stage: Secondary | ICD-10-CM | POA: Diagnosis not present

## 2018-06-23 DIAGNOSIS — H40052 Ocular hypertension, left eye: Secondary | ICD-10-CM | POA: Diagnosis not present

## 2018-06-27 DIAGNOSIS — J4520 Mild intermittent asthma, uncomplicated: Secondary | ICD-10-CM | POA: Diagnosis not present

## 2018-06-27 DIAGNOSIS — Z1509 Genetic susceptibility to other malignant neoplasm: Secondary | ICD-10-CM | POA: Diagnosis not present

## 2018-06-27 DIAGNOSIS — F411 Generalized anxiety disorder: Secondary | ICD-10-CM | POA: Diagnosis not present

## 2018-06-27 DIAGNOSIS — Z23 Encounter for immunization: Secondary | ICD-10-CM | POA: Diagnosis not present

## 2018-06-27 DIAGNOSIS — F331 Major depressive disorder, recurrent, moderate: Secondary | ICD-10-CM | POA: Diagnosis not present

## 2018-07-14 DIAGNOSIS — H40052 Ocular hypertension, left eye: Secondary | ICD-10-CM | POA: Diagnosis not present

## 2018-07-14 DIAGNOSIS — H401112 Primary open-angle glaucoma, right eye, moderate stage: Secondary | ICD-10-CM | POA: Diagnosis not present

## 2018-07-28 DIAGNOSIS — Z0000 Encounter for general adult medical examination without abnormal findings: Secondary | ICD-10-CM | POA: Diagnosis not present

## 2018-07-28 DIAGNOSIS — Z136 Encounter for screening for cardiovascular disorders: Secondary | ICD-10-CM | POA: Diagnosis not present

## 2018-07-28 DIAGNOSIS — Z1159 Encounter for screening for other viral diseases: Secondary | ICD-10-CM | POA: Diagnosis not present

## 2018-07-29 ENCOUNTER — Inpatient Hospital Stay: Payer: Medicare Other

## 2018-07-29 ENCOUNTER — Inpatient Hospital Stay: Payer: Medicare Other | Admitting: Internal Medicine

## 2018-07-29 NOTE — Progress Notes (Deleted)
[place] CONSULT NOTE  Patient Care Team: System, Pcp Not In as PCP - General Penland, Kelby Fam, MD (Inactive) as PCP - Hematology/Oncology (Hematology and Oncology) Gala Romney Cristopher Estimable, MD as Consulting Physician (Gastroenterology)  CHIEF COMPLAINTS/PURPOSE OF CONSULTATION:  Endometrial cancer  #  Oncology History   # OCT 2015- Uterine ca- STAGE IV [multiple lung nodules]; Nov 2015S/p radical hysterectomy, BSO, pelvic lymph node dissection/vaginal biopsies/extensive pelvic disease including positive lymph nodes and positive distal vaginal biopsy.]; [[facility]]-endometroid G-2; LVI; positive Pelvic LN/vaginal Bx Jan-April 2016- carbo-Taxol x6 cycles [facility] Na; Dr.Penland]  # Oct 2016- colo- high grade dysplasia [Dr.Rourk; Dr.Jenkins s/p right hemi-colectomy]; last colo- may 2018;    [facility]ch [facility]  Histologic grade G2, histologic type endometrioid adenocarcinoma     Endometrial cancer ([place])   07/12/2014 Imaging    CT C/A/P, pelvic adenopathy, multiple pulmonary nodules concerning for metastatic disease    07/30/2014 Initial Diagnosis    Endometrial cancer    07/31/2014 Pathology Results    endometrioid adenocarcinoma, G2, lymphovascular invasion present, positive pelvic lymph node and vaginal biopsy    07/31/2014 Definitive Surgery    radical hysterectomy, BSO, pelvic lymph node dissection and vaginal biopsies    09/25/2014 Procedure    Port-A-Cath placement in IR    10/18/2014 - 01/10/2015 Chemotherapy    Carboplatin/Taxol. First cycle given at [facility]  S/P 6 cycles total, 5 cycles given at [facility].    07/04/2015 Procedure    Colonoscopy by Dr. Gala Romney    07/04/2015 Pathology Results    Colon, polyp(s), vicinity of hepatic flexure - TUBULAR ADENOMA WITH FOCAL HIGH GRADE DYSPLASIA.     Procedure    Partial colectomy by Dr. Arnoldo Morale scheduled for 08/30/2015    10/31/2015 Genetic Testing    [facility] SYNDROME MSH6 Mutation    04/27/2016  Imaging    CT CAP- No acute process or evidence of metastatic disease in the chest. Right middle lobe pulmonary nodule is unchanged back to 11/20/2014, favoring a benign etiology    11/10/2016 Imaging    CT CAP- 1. No evidence of metastatic disease in the chest, abdomen or pelvis. 2. No evidence of local tumor recurrence at the ileocolic anastomosis in the right abdomen or in the pelvis. Decreased mild fat stranding at the base of the right mesentery, most consistent with postsurgical scarring. 3. Aortic atherosclerosis.     Adenocarcinoma of colon ([place])   07/04/2015 Pathology Results    Diagnosis 1. Colon, polyp(s), vicinity of hepatic flexure - TUBULAR ADENOMA WITH FOCAL HIGH GRADE DYSPLASIA. - NO INVASIVE CARCINOMA. 2. Colon, polyp(s), sigmoid - HYPERPLASTIC POLYP. - NO DYSPLASIA OR MALIGNANCY.    07/04/2015 Procedure    Colonoscopoy by Dr. Gala Romney- large polypoid colonic lesion.    08/30/2015 Definitive Surgery    Dr. Arnoldo Morale- right segmental colon resection    08/30/2015 Pathology Results    [id number] intramucosal adenocarcinoma of colon, 0.2 cm inding the laminal propria with negative resection margins and 0/17 lymph nodes.      HISTORY OF PRESENTING ILLNESS:  Julia Osborne 57 y.o.  female history of endometrial cancer stage IV who has been followed at [facility] is here to establish care/given proximity.  I reviewed the previous records in detail; summarized above.  Patient was diagnosed with stage IV endometrial cancer endometrioid type with multiple lung nodules of presentation.  Patient had radical hysterectomy bilateral salpingo-nephrectomy; pelvic lymph dissection.  She went on to have adjuvant chemotherapy carbotaxol x6  cycles.  Patient had significant response noted on the CT scan.  Patient also had a screening colonoscopy; given the diagnosis of Lynch syndrome-had a high-grade polyp/dysplastic polyp-for which she had left in the colectomy.   Patient complains of  Chronic tingling and numbness of hand and feet.  She has been taking tramadol which seems to help the pain/also Lyrica.    Review of Systems  Constitutional: Negative for chills, diaphoresis, fever, malaise/fatigue and weight loss.  HENT: Negative for nosebleeds and sore throat.   Eyes: Negative for double vision.  Respiratory: Negative for cough, hemoptysis, sputum production, shortness of breath and wheezing.   Cardiovascular: Negative for chest pain, palpitations, orthopnea and leg swelling.  Gastrointestinal: Negative for abdominal pain, blood in stool, constipation, diarrhea, heartburn, melena, nausea and vomiting.  Genitourinary: Negative for dysuria, frequency and urgency.  Musculoskeletal: Negative for back pain and joint pain.  Skin: Negative.  Negative for itching and rash.  Neurological: Positive for tingling. Negative for dizziness, focal weakness, weakness and headaches.  Endo/Heme/Allergies: Does not bruise/bleed easily.  Psychiatric/Behavioral: Negative for depression. The patient is not nervous/anxious and does not have insomnia.      MEDICAL HISTORY:  Past Medical History:  Diagnosis Date  . Adenocarcinoma of colon ([place]) 09/13/2015  . Anemia   . Anxiety   . Asthma   . Cancer ([place])    endometrial; cancer cells in intestine  . COPD (chronic obstructive pulmonary disease) ([place])    no definite diagnosis  . Depression   . Diverticulitis   . Dyspareunia 05/16/2015  . Family history of cancer   . Family history of kidney cancer   . GERD (gastroesophageal reflux disease)   . Indigestion   . Lynch syndrome   . Neuropathy    feet and hands  . Uterine fibroid   . Vaginal dryness 05/16/2015  . Vaginal itching 07/16/2015  . Vaginal Pap smear, abnormal     SURGICAL HISTORY: Past Surgical History:  Procedure Laterality Date  . ABDOMINAL HYSTERECTOMY    . APPENDECTOMY    . BIOPSY N/A 03/14/2015   Procedure: BIOPSY;  Surgeon: Daneil Dolin, MD;  Location: [facility] ORS;   Service: Endoscopy;  Laterality: N/A;  Gastric  . COLONOSCOPY N/A 01/28/2017   Procedure: COLONOSCOPY;  Surgeon: Daneil Dolin, MD;  Location: [facility] END[facility] SU[facility];  Service: Endoscopy;  Laterality: N/A;  11:30am  . COLONOSCOPY WITH PROPOFOL N/A 03/14/2015   RMR: Internal hemorrhoids. colonic diverticulosis. Incomplete examination. Prepartation inadequate.  . COLONOSCOPY WITH PROPOFOL N/A 07/04/2015   RMR: Colonic diverticulosis . Large polypoid lesion in the vicinity of the hepatic flexure status post saline-assisted piecmeal snare polypectomy  with ablation and tattooing as described. Sigmoid polyp removed as described above. sigmoid colon polyp hyperplastic, hepatic flexure polyp with TA with focal high grade dysplasia   . ESOPHAGEAL DILATION N/A 03/14/2015   Procedure: ESOPHAGEAL DILATION;  Surgeon: Daneil Dolin, MD;  Location: [facility];  Service: Endoscopy;  Laterality: N/A;  Maloney 30  . ESOPHAGOGASTRODUODENOSCOPY N/A 05/19/2016   Procedure: ESOPHAGOGASTRODUODENOSCOPY (EGD);  Surgeon: Daneil Dolin, MD;  Location: [facility];  Service: Endoscopy;  Laterality: N/A;  215  . ESOPHAGOGASTRODUODENOSCOPY N/A 01/28/2017   Procedure: ESOPHAGOGASTRODUODENOSCOPY (EGD);  Surgeon: Daneil Dolin, MD;  Location: [facility];  Service: Endoscopy;  Laterality: N/A;  . ESOPHAGOGASTRODUODENOSCOPY (EGD) WITH PROPOFOL N/A 03/14/2015   RMR: Mild erosive reflux esophagitis status post passage o f a Maloney dilator. Abnormal gastric mucosa of uncertain significance as described above.  status post biopsy, benign  . ESOPHAGOGASTRODUODENOSCOPY (EGD) WITH PROPOFOL N/A 11/18/2017   Procedure: ESOPHAGOGASTRODUODENOSCOPY (EGD) WITH PROPOFOL;  Surgeon: Daneil Dolin, MD;  [facility];  Service: Endoscopy;  Laterality: N/A;  12:15pm  . MALONEY DILATION N/A 05/19/2016   Procedure: [name] DILATION;  Surgeon: Daneil Dolin, MD;  [facility];  Service: Endoscopy;  Laterality: N/A;  . [name] DILATION  N/A 01/28/2017   Procedure: [name] DILATION;  Surgeon: Daneil Dolin, MD;  [facility];  Service: Endoscopy;  Laterality: N/A;  [name] DILATION N/A 11/18/2017   Procedure: [name] DILATION;  Surgeon: Daneil Dolin, MD;  [facility]: [facility];  Service: Endoscopy;  Laterality: N/A;  . PARTIAL COLECTOMY  08/30/2015   polyp with adenocarcinoma  . POLYPECTOMY N/A 07/04/2015   Procedure: POLYPECTOMY;  Surgeon: Daneil Dolin, MD;  Location: [facility];  Service: Endoscopy;  Laterality: N/A;  . PORTACATH PLACEMENT Right 09/2014  . TUBAL LIGATION      SOCIAL H[place]ORY[place]; smoking- 1/2 ppd; no alcohol; used to run machines;  Social History   Socioeconomic History  . Marital status: Married    Spouse name: Not on file  . Number of children: 2  . Years of education: Not on file  . Highest education level: Not on file  Occupational History  . Not on file  Social Needs  . Financial resource strain: Not on file  . Food insecurity:    Worry: Not on file    Inability: Not on file  . Transportation needs:    Medical: Not on file    Non-medical: Not on file  Tobacco Use  . Smoking status: Current Every Day Smoker    Packs/day: 0.25    Years: 25.00    Pack years: 6.25    Types: Cigarettes  . Smokeless tobacco: Never Used  . Tobacco comment: Is trying to quit/wean off.  Substance and Sexual Activity  . Alcohol use: No  . Drug use: No  . Sexual activity: Not Currently    Birth control/protection: Surgical    Comment: hyst  Lifestyle  . Physical activity:    Days per week: Not on file    Minutes per session: Not on file  . Stress: Not on file  Relationships  . Social connections:    Talks on phone: Not on file    Gets together: Not on file    Attends religious service: Not on file    Active member of club or organization: Not on file    Attends meetings of clubs or organizations: Not on file    Relationship status: Not on file  . Intimate partner  violence:    Fear of current or ex partner: Not on file    Emotionally abused: Not on file    Physically abused: Not on file    Forced sexual activity: Not on file  Other Topics Concern  . Not on file  Social History Narrative  . Not on file    FAMILY HISTORY: Dad- MI; ? Cancer on autopsy; sisters/aunts- brain; lung cancer x2; oldest sister- kidney cancer Family History  Problem Relation Age of Onset  . Other Mother        clot that went to heart, deceased age 74ss  . Heart attack Father        age 18s, deceased  . Stroke Father   . Cancer Father        "at death determined  he was ate up with cancer"  . Hypertension Sister   . Kidney cancer Sister 74  . Diabetes Brother   . Hypertension Brother   . Endometriosis Daughter   . Heart attack Maternal Grandfather   . Diabetes Sister   . Brain cancer Paternal Aunt   . Cancer Paternal Uncle        NOS  . Cancer Paternal Uncle        NOS  . Cancer Paternal Uncle        NOS  . Colon cancer Neg Hx     ALLERGIES:  is allergic to codeine.  MEDICATIONS:  Current Outpatient Medications  Medication Sig Dispense Refill  . albuterol (PROVENTIL HFA;VENTOLIN HFA) 108 (90 Base) MCG/ACT inhaler Inhale 2 puffs into the lungs every 2 (two) hours as needed for wheezing or shortness of breath (cough). 1 Inhaler 3  . albuterol (PROVENTIL) (2.5 MG/3ML) 0.083% nebulizer solution Take 3 mLs (2.5 mg total) by nebulization every 6 (six) hours as needed for wheezing or shortness of breath. 75 mL 12  . ALPRAZolam (XANAX) 0.25 MG tablet Take 0.125 mg by mouth daily as needed for anxiety.     . citalopram (CELEXA) 40 MG tablet Take 40 mg by mouth at bedtime.  2  . DEXILANT 60 MG capsule TAKE 1 CAPSULE BY MOUTH EVERY DAY 90 capsule 3  . diazepam (VALIUM) 5 MG tablet Take 1 tablet (5 mg total) by mouth every 8 (eight) hours as needed for muscle spasms. (Patient not taking: Reported on 10/22/2017) 20 tablet 0  . LINZESS 145 MCG CAPS capsule TAKE 1 CAPSULE  (145 MCG TOTAL) BY MOUTH DAILY BEFORE BREAKFAST. 90 capsule 3  . loratadine (CLARITIN) 10 MG tablet Take 10 mg by mouth daily as needed for allergies.     Marland Kitchen LYRICA 75 MG capsule TAKE 1 CAPSULE BY MOUTH EVERYDAY AT BEDTIME (Patient taking differently: TAKE 75 MG BY MOUTH EVERYDAY AT BEDTIME) 30 capsule 2  . Naphazoline-Pheniramine (ALLERGY EYE OP) Place 1-2 drops into both eyes 2 (two) times daily as needed (ITCHY, WATERY EYES).    . ondansetron (ZOFRAN) 8 MG tablet Take 8 mg by mouth daily as needed (CAR SICKNESS).    . phenazopyridine (PYRIDIUM) 200 MG tablet Take 1 tablet (200 mg total) by mouth 3 (three) times daily as needed for pain. (Patient not taking: Reported on 01/26/2018) 20 tablet 0  . sodium chloride (OCEAN) 0.65 % SOLN nasal spray Place 1 spray into both nostrils as needed for congestion.    . sucralfate (CARAFATE) 1 GM/10ML suspension Take 10 mLs (1 g total) by mouth 4 (four) times daily. (Patient taking differently: Take 1 g by mouth 2 (two) times daily. ) 420 mL 1  . traMADol (ULTRAM) 50 MG tablet TAKE 1 TABLET BY MOUTH EVERY DAY AS NEEDED 30 tablet 3  . traZODone (DESYREL) 50 MG tablet Take 50 mg by mouth at bedtime.     No current facility-administered medications for this visit.       Marland Kitchen  PHYSICAL EXAMINATION: ECOG PERFORMANCE STATUS: 0 - Asymptomatic  There were no vitals filed for this visit. There were no vitals filed for this visit.  Physical Exam  Constitutional: She is oriented to person, place, and time and well-developed, well-nourished, and in no distress.  HENT:  Head: Normocephalic and atraumatic.  Mouth/Throat: Oropharynx is clear and moist. No oropharyngeal exudate.  Eyes: Pupils are equal, round, and reactive to light.  Neck: Normal range of motion. Neck supple.  Cardiovascular: Normal rate and regular rhythm.  Pulmonary/Chest: No respiratory distress. She has no wheezes.  Abdominal: Soft. Bowel sounds are normal. She exhibits no distension and no mass.  There is no tenderness. There is no rebound and no guarding.  Musculoskeletal: Normal range of motion. She exhibits no edema or tenderness.  Neurological: She is alert and oriented to person, place, and time.  Skin: Skin is warm.  Psychiatric: Affect normal.    LABORATORY DATA:  I have reviewed the data as listed Lab Results  Component Value Date   WBC 8.0 01/06/2018   HGB 12.7 01/06/2018   HCT 38.5 01/06/2018   MCV 90.6 01/06/2018   PLT 251 01/06/2018   Recent Labs    09/04/17 0937 09/27/17 2349 01/06/18 1103  NA 136 138 141  K 4.4 3.8 3.6  CL 103 103 104  CO2 '26 24 26  ' GLUCOSE 115* 108* 105*  BUN '13 13 10  ' CREATININE 0.92 0.95 0.70  CALCIUM 9.3 9.3 9.2  GFRNONAA >60 >60 >60  GFRAA >60 >60 >60  PROT 7.1 7.5 7.4  ALBUMIN 4.0 4.2 4.1  AST '22 19 18  ' ALT '22 21 21  ' ALKPHOS 59 55 64  BILITOT 0.5 0.3 0.4  BILIDIR  --  0.1  --   IBILI  --  0.2*  --     RADIOGRAPHIC STUDIES: I have personally reviewed the radiological images as listed and agreed with the findings in the report. No results found.  ASSESSMENT & PLAN:   No problem-specific Assessment & Plan notes found for this encounter.   All questions were answered. The patient knows to call the clinic with any problems, questions or concerns.     Cammie Sickle, MD 07/29/2018 8:17 AM

## 2018-07-29 NOTE — Assessment & Plan Note (Deleted)
#   Uterine Cancer endometrioid type grade 2; stage IV [lung nodules at presentation 2015/]-currently no evidence of recurrence; recent CT scan chest January 2019-no evidence of disease.  CT abdomen pelvis April 2018 NED.  # history of high-grade polyp/status post colectomy: given the history of Lynch syndrome-patient needs intensive surveillance colonoscopies; last colonoscopy-May 2018.  Patient awaiting appointment with GI at the end of this month with Dr. Vira Agar.   # Active smoker smoking cessation/ program quit smoking.   # Lynch syndrome-discussed the family implications of lung syndrome; patient currently has a 77 year old son [hardheaded]; daughter 20 years.  Not screened.  Discussed with the patient to talk to the family regarding screening for Lynch syndrome.  Recommend: Annual skin exam; EGD every 2 to 3 years; UA/cytology  # PN- G-23/ now- 1; contonue tranadol 50 mg evry day prn;   # follow up in 6 months/labs-UA; refer to gyn-oncology.  # # 60 minutes face-to-face with the patient discussing the above plan of care; more than 50% of time spent on prognosis/ natural history; counseling and coordination. Marland Kitchen

## 2018-08-09 ENCOUNTER — Other Ambulatory Visit: Payer: Self-pay | Admitting: Family Medicine

## 2018-08-09 DIAGNOSIS — F411 Generalized anxiety disorder: Secondary | ICD-10-CM | POA: Diagnosis not present

## 2018-08-09 DIAGNOSIS — Z1509 Genetic susceptibility to other malignant neoplasm: Secondary | ICD-10-CM | POA: Diagnosis not present

## 2018-08-09 DIAGNOSIS — F331 Major depressive disorder, recurrent, moderate: Secondary | ICD-10-CM | POA: Diagnosis not present

## 2018-08-09 DIAGNOSIS — Z6830 Body mass index (BMI) 30.0-30.9, adult: Secondary | ICD-10-CM | POA: Diagnosis not present

## 2018-08-09 DIAGNOSIS — E669 Obesity, unspecified: Secondary | ICD-10-CM | POA: Diagnosis not present

## 2018-08-09 DIAGNOSIS — N6315 Unspecified lump in the right breast, overlapping quadrants: Secondary | ICD-10-CM

## 2018-08-09 DIAGNOSIS — F17210 Nicotine dependence, cigarettes, uncomplicated: Secondary | ICD-10-CM | POA: Diagnosis not present

## 2018-08-09 DIAGNOSIS — N6310 Unspecified lump in the right breast, unspecified quadrant: Principal | ICD-10-CM

## 2018-08-09 DIAGNOSIS — Z79899 Other long term (current) drug therapy: Secondary | ICD-10-CM | POA: Diagnosis not present

## 2018-08-09 DIAGNOSIS — Z0000 Encounter for general adult medical examination without abnormal findings: Secondary | ICD-10-CM | POA: Diagnosis not present

## 2018-08-10 ENCOUNTER — Telehealth: Payer: Self-pay | Admitting: *Deleted

## 2018-08-10 DIAGNOSIS — Z87891 Personal history of nicotine dependence: Secondary | ICD-10-CM

## 2018-08-10 DIAGNOSIS — Z122 Encounter for screening for malignant neoplasm of respiratory organs: Secondary | ICD-10-CM

## 2018-08-10 NOTE — Telephone Encounter (Signed)
Received referral for initial lung cancer screening scan. Contacted patient and obtained smoking history,(current, 67.5 pack year) as well as answering questions related to screening process. Patient denies signs of lung cancer such as weight loss or hemoptysis. Patient denies comorbidity that would prevent curative treatment if lung cancer were found. Patient is scheduled for shared decision making visit and CT scan on 09/07/18 at 1115, delay is patient preference.

## 2018-08-29 HISTORY — PX: PARTIAL COLECTOMY: SHX5273

## 2018-09-07 ENCOUNTER — Encounter: Payer: Self-pay | Admitting: Oncology

## 2018-09-07 ENCOUNTER — Other Ambulatory Visit: Payer: Self-pay

## 2018-09-07 ENCOUNTER — Ambulatory Visit
Admission: RE | Admit: 2018-09-07 | Discharge: 2018-09-07 | Disposition: A | Discharge status: Home / Self Care | Payer: Medicare Other | Source: Ambulatory Visit | Attending: Oncology | Admitting: Oncology

## 2018-09-07 ENCOUNTER — Inpatient Hospital Stay (HOSPITAL_BASED_OUTPATIENT_CLINIC_OR_DEPARTMENT_OTHER): Payer: Medicare Other | Admitting: Internal Medicine

## 2018-09-07 ENCOUNTER — Inpatient Hospital Stay: Payer: Medicare Other

## 2018-09-07 ENCOUNTER — Inpatient Hospital Stay: Payer: Medicare Other | Attending: Internal Medicine | Admitting: Obstetrics and Gynecology

## 2018-09-07 ENCOUNTER — Inpatient Hospital Stay (HOSPITAL_BASED_OUTPATIENT_CLINIC_OR_DEPARTMENT_OTHER): Payer: Medicare Other | Admitting: Oncology

## 2018-09-07 VITALS — BP 132/82 | HR 98 | Resp 16 | Ht 63.0 in | Wt 165.3 lb

## 2018-09-07 VITALS — BP 132/82 | HR 98 | Resp 16

## 2018-09-07 DIAGNOSIS — C541 Malignant neoplasm of endometrium: Secondary | ICD-10-CM | POA: Diagnosis not present

## 2018-09-07 DIAGNOSIS — Z1509 Genetic susceptibility to other malignant neoplasm: Secondary | ICD-10-CM | POA: Insufficient documentation

## 2018-09-07 DIAGNOSIS — F17210 Nicotine dependence, cigarettes, uncomplicated: Secondary | ICD-10-CM | POA: Insufficient documentation

## 2018-09-07 DIAGNOSIS — Z90710 Acquired absence of both cervix and uterus: Secondary | ICD-10-CM | POA: Insufficient documentation

## 2018-09-07 DIAGNOSIS — Z87891 Personal history of nicotine dependence: Secondary | ICD-10-CM

## 2018-09-07 DIAGNOSIS — Z9221 Personal history of antineoplastic chemotherapy: Secondary | ICD-10-CM | POA: Insufficient documentation

## 2018-09-07 DIAGNOSIS — C189 Malignant neoplasm of colon, unspecified: Secondary | ICD-10-CM

## 2018-09-07 DIAGNOSIS — Z122 Encounter for screening for malignant neoplasm of respiratory organs: Secondary | ICD-10-CM | POA: Insufficient documentation

## 2018-09-07 DIAGNOSIS — R1310 Dysphagia, unspecified: Secondary | ICD-10-CM | POA: Diagnosis not present

## 2018-09-07 DIAGNOSIS — Z90722 Acquired absence of ovaries, bilateral: Secondary | ICD-10-CM

## 2018-09-07 LAB — CBC WITH DIFFERENTIAL/PLATELET
Abs Immature Granulocytes: 0.02 10*3/uL (ref 0.00–0.07)
BASOS ABS: 0.1 10*3/uL (ref 0.0–0.1)
Basophils Relative: 1 %
Eosinophils Absolute: 0.2 10*3/uL (ref 0.0–0.5)
Eosinophils Relative: 2 %
HCT: 37.9 % (ref 36.0–46.0)
HEMOGLOBIN: 12.4 g/dL (ref 12.0–15.0)
IMMATURE GRANULOCYTES: 0 %
LYMPHS PCT: 35 %
Lymphs Abs: 2.3 10*3/uL (ref 0.7–4.0)
MCH: 29.7 pg (ref 26.0–34.0)
MCHC: 32.7 g/dL (ref 30.0–36.0)
MCV: 90.9 fL (ref 80.0–100.0)
Monocytes Absolute: 0.5 10*3/uL (ref 0.1–1.0)
Monocytes Relative: 7 %
NEUTROS ABS: 3.7 10*3/uL (ref 1.7–7.7)
NRBC: 0 % (ref 0.0–0.2)
Neutrophils Relative %: 55 %
Platelets: 246 10*3/uL (ref 150–400)
RBC: 4.17 MIL/uL (ref 3.87–5.11)
RDW: 13.1 % (ref 11.5–15.5)
WBC: 6.7 10*3/uL (ref 4.0–10.5)

## 2018-09-07 LAB — COMPREHENSIVE METABOLIC PANEL
ALT: 20 U/L (ref 0–44)
AST: 18 U/L (ref 15–41)
Albumin: 4.1 g/dL (ref 3.5–5.0)
Alkaline Phosphatase: 54 U/L (ref 38–126)
Anion gap: 8 (ref 5–15)
BUN: 10 mg/dL (ref 6–20)
CHLORIDE: 105 mmol/L (ref 98–111)
CO2: 27 mmol/L (ref 22–32)
Calcium: 9.1 mg/dL (ref 8.9–10.3)
Creatinine, Ser: 0.93 mg/dL (ref 0.44–1.00)
Glucose, Bld: 101 mg/dL — ABNORMAL HIGH (ref 70–99)
Potassium: 3.7 mmol/L (ref 3.5–5.1)
SODIUM: 140 mmol/L (ref 135–145)
Total Bilirubin: 0.3 mg/dL (ref 0.3–1.2)
Total Protein: 7.1 g/dL (ref 6.5–8.1)

## 2018-09-07 NOTE — Progress Notes (Signed)
Patient here for follow up. She reports that she has experienced several fall over the past month and has pain under her right arm from one fall. She reports severe vaginal dryness which is chronic.

## 2018-09-07 NOTE — Progress Notes (Signed)
Gynecologic Oncology Interval Visit   Referring Provider: Dr. Rogue Bussing  Chief Concern: Endometrial cancer  Subjective:  Julia Osborne is a 57 y.o. female, initially seen in consultation for Dr. Rogue Bussing for stage IV endometrioid endometrial cancer with history of Lynch syndrome, s/p TAH/BSO 07/2014 and 6 cycles of adjuvant carbo-taxol at [facility], who returns to clinic today for surveillance.  For history of Lynch syndrome she is followed by Dr. Vira Agar with GI. Mammogram-10/06/2017 reported as BI-RADS Category 1-negative. She is followed by low-dose CT lung cancer screening program.  She continues to smoke.  Today, she has multiple complaints including fatigue, weakness, shortness of breath, cough, abdominal pain, abdominal bloating and distention, decreased appetite, chronic nausea, constipation, problems with bladder function, back pain, leg swelling, numbness and tingling, skin problems, allergy problems, vulvar irritation and itching.  She also reports sore throat and difficulty swallowing.  Gynecologic Oncology History:  She has Lynch Syndrome with MSH6 mutation and developed uterine cancer, endometrioid grade 2; stage IV with lung nodules in 2015.  Had surgery TAH/BSO, nodes and vaginal bx 11/15 with Dr Hessie Dibble at [facility] in [place].  Then got six cycles of carboplatin/taxol at [facility].    CT Scan Chest January 2019-no evidence of disease.  CT abdomen pelvis April 2018 NED.  She has a history of high-grade polyp/status post colectomy: given the history of Lynch syndrome-patient needs intensive surveillance colonoscopies; last colonoscopy-May 2018.  Patient awaiting appointment with GI at the end of this month with Dr. Vira Agar in GI.   Saw Dr Rogue Bussing in May for surveillance and Gyn Onc referral also made to see [place].  Problem List: Patient Active Problem List   Diagnosis Date Noted  . Abdominal pain 12/21/2016  . Lynch syndrome 10/31/2015  . Genetic testing  10/21/2015  . Abdominal pain, left upper quadrant 10/17/2015  . Family history of cancer   . Family history of kidney cancer   . Adenocarcinoma of colon ([place]) 09/13/2015  . Colon neoplasm 08/30/2015  . Vaginal itching 07/16/2015  . History of colonic polyps   . 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 drug ([place]) 06/28/2015  . Constipation 06/05/2015  . Abnormal CT scan, sigmoid colon 06/05/2015  . Vaginal dryness 05/16/2015  . Dyspareunia 05/16/2015  . Mucosal abnormality of stomach   . Reflux esophagitis   . Dysphagia   . Hematochezia   . Diverticulosis of colon without hemorrhage   . Rectal bleeding 02/14/2015  . GERD (gastroesophageal reflux disease) 02/14/2015  . Esophageal dysphagia 02/14/2015  . Abdominal pain, epigastric 02/14/2015  . Nausea with vomiting 02/14/2015  . Endometrial cancer ([place]) 10/09/2014  . Conversion reaction   . Depression   . Aphasia 09/11/2014    Past Medical History: Past Medical History:  Diagnosis Date  . Adenocarcinoma of colon ([place]) 09/13/2015  . Anemia   . Anxiety   . Asthma   . Cancer ([place])    endometrial; cancer cells in intestine  . COPD (chronic obstructive pulmonary disease) ([place])    no definite diagnosis  . Depression   . Diverticulitis   . Dyspareunia 05/16/2015  . Family history of cancer   . Family history of kidney cancer   . GERD (gastroesophageal reflux disease)   . Indigestion   . Lynch syndrome   . Neuropathy    feet and hands  . Uterine fibroid   . Vaginal dryness 05/16/2015  . Vaginal itching 07/16/2015  . Vaginal Pap smear, abnormal     Past Surgical History: Past  Surgical History:  Procedure Laterality Date  . ABDOMINAL HYSTERECTOMY    . APPENDECTOMY    . BIOPSY N/A 03/14/2015   Procedure: BIOPSY;  Surgeon: Daneil Dolin, MD;  Location: [facility];  Service: Endoscopy;  Laterality: N/A;  Gastric  . COLONOSCOPY N/A 01/28/2017   Procedure: COLONOSCOPY;  Surgeon: Daneil Dolin, MD;  Location: [facility];   Service: Endoscopy;  Laterality: N/A;  11:30am  . COLONOSCOPY WITH PROPOFOL N/A 03/14/2015   RMR: Internal hemorrhoids. colonic diverticulosis. Incomplete examination. Prepartation inadequate.  . COLONOSCOPY WITH PROPOFOL N/A 07/04/2015   RMR: Colonic diverticulosis . Large polypoid lesion in the vicinity of the hepatic flexure status post saline-assisted piecmeal snare polypectomy  with ablation and tattooing as described. Sigmoid polyp removed as described above. sigmoid colon polyp hyperplastic, hepatic flexure polyp with TA with focal high grade dysplasia   . ESOPHAGEAL DILATION N/A 03/14/2015   Procedure: ESOPHAGEAL DILATION;  Surgeon: Daneil Dolin, MD;  Location: [facility];  Service: Endoscopy;  Laterality: N/A;  Maloney 51  . ESOPHAGOGASTRODUODENOSCOPY N/A 05/19/2016   Procedure: ESOPHAGOGASTRODUODENOSCOPY (EGD);  Surgeon: Daneil Dolin, MD;  Location: [facility];  Service: Endoscopy;  Laterality: N/A;  215  . ESOPHAGOGASTRODUODENOSCOPY N/A 01/28/2017   Procedure: ESOPHAGOGASTRODUODENOSCOPY (EGD);  Surgeon: Daneil Dolin, MD;  [facility];  Service: Endoscopy;  Laterality: N/A;  . ESOPHAGOGASTRODUODENOSCOPY (EGD) WITH PROPOFOL N/A 03/14/2015   RMR: Mild erosive reflux esophagitis status post passage o f a [place] dilator. Abnormal gastric mucosa of uncertain significance as described above. status post biopsy, benign  . ESOPHAGOGASTRODUODENOSCOPY (EGD) WITH PROPOFOL N/A 11/18/2017   Procedure: ESOPHAGOGASTRODUODENOSCOPY (EGD) WITH PROPOFOL;  Surgeon: Daneil Dolin, MD;  [facility];  Service: Endoscopy;  Laterality: N/A;  12:15pm  . MALONEY DILATION N/A 05/19/2016   Procedure: [name] DILATION;  Surgeon: Daneil Dolin, MD;  [facility];  Service: Endoscopy;  Laterality: N/A;  . [name] DILATION N/A 01/28/2017   Procedure: [name] DILATION;  Surgeon: Daneil Dolin, MD;  [facility];  Service: Endoscopy;  Laterality: N/A;  [name] DILATION  N/A 11/18/2017   Procedure: [name] DILATION;  Surgeon: Daneil Dolin, MD;  [facility];  Service: Endoscopy;  Laterality: N/A;  . PARTIAL COLECTOMY  08/30/2015   polyp with adenocarcinoma  . POLYPECTOMY N/A 07/04/2015   Procedure: POLYPECTOMY;  Surgeon: Daneil Dolin, MD;  Location: [facility];  Service: Endoscopy;  Laterality: N/A;  . PORTACATH PLACEMENT Right 09/2014  . TUBAL LIGATION       OB History:  OB History  Gravida Para Term Preterm AB Living  _0 SAB TAB Ectopic Multiple Live Births               # Outcome Date GA Lbr Len/2nd Weight Sex Delivery Anes PTL Lv  2 Para           1 Para             Family History: Family History  Problem Relation Age of Onset  . Other Mother        clot that went to heart, deceased age 76ss  . Heart attack Father        age 92s, deceased  . Stroke Father   . Cancer Father        "at death determined he was ate up with cancer"  . Hypertension Sister   . Kidney  cancer Sister 33  . Diabetes Brother   . Hypertension Brother   . Endometriosis Daughter   . Heart attack Maternal Grandfather   . Diabetes Sister   . Brain cancer Paternal Aunt   . Cancer Paternal Uncle        NOS  . Cancer Paternal Uncle        NOS  . Cancer Paternal Uncle        NOS  . Colon cancer Neg Hx     Social History: Social History   Socioeconomic History  . Marital status: Married    Spouse name: Not on file  . Number of children: 2  . Years of education: Not on file  . Highest education level: Not on file  Occupational History  . Not on file  Social Needs  . Financial resource strain: Not on file  . Food insecurity:    Worry: Not on file    Inability: Not on file  . Transportation needs:    Medical: Not on file    Non-medical: Not on file  Tobacco Use  . Smoking status: Current Every Day Smoker    Packs/day: 2.00    Years: 25.00    Pack years: 50.00    Types: Cigarettes  . Smokeless tobacco: Never Used  . Tobacco  comment: Is trying to quit/wean off.  Substance and Sexual Activity  . Alcohol use: No  . Drug use: No  . Sexual activity: Not Currently    Birth control/protection: Surgical    Comment: hyst  Lifestyle  . Physical activity:    Days per week: Not on file    Minutes per session: Not on file  . Stress: Not on file  Relationships  . Social connections:    Talks on phone: Not on file    Gets together: Not on file    Attends religious service: Not on file    Active member of club or organization: Not on file    Attends meetings of clubs or organizations: Not on file    Relationship status: Not on file  . Intimate partner violence:    Fear of current or ex partner: Not on file    Emotionally abused: Not on file    Physically abused: Not on file    Forced sexual activity: Not on file  Other Topics Concern  . Not on file  Social History Narrative  . Not on file    Allergies: Allergies  Allergen Reactions  . Codeine Rash    Current Medications: Current Outpatient Medications  Medication Sig Dispense Refill  . albuterol (PROVENTIL HFA;VENTOLIN HFA) 108 (90 Base) MCG/ACT inhaler Inhale 2 puffs into the lungs every 2 (two) hours as needed for wheezing or shortness of breath (cough). 1 Inhaler 3  . albuterol (PROVENTIL) (2.5 MG/3ML) 0.083% nebulizer solution Take 3 mLs (2.5 mg total) by nebulization every 6 (six) hours as needed for wheezing or shortness of breath. 75 mL 12  . ALPRAZolam (XANAX) 0.25 MG tablet Take 0.125 mg by mouth daily as needed for anxiety.     Marland Kitchen buPROPion (WELLBUTRIN SR) 150 MG 12 hr tablet Take 1 tablet by mouth daily.    . citalopram (CELEXA) 40 MG tablet Take 40 mg by mouth at bedtime.  2  . DEXILANT 60 MG capsule TAKE 1 CAPSULE BY MOUTH EVERY DAY 90 capsule 3  . diazepam (VALIUM) 5 MG tablet Take 1 tablet (5 mg total) by mouth every 8 (eight) hours as needed for  muscle spasms. 20 tablet 0  . LINZESS 145 MCG CAPS capsule TAKE 1 CAPSULE (145 MCG TOTAL) BY  MOUTH DAILY BEFORE BREAKFAST. 90 capsule 3  . loratadine (CLARITIN) 10 MG tablet Take 10 mg by mouth daily as needed for allergies.     Marland Kitchen LUMIGAN 0.01 % SOLN Place 1 drop into the right eye at bedtime.    Marland Kitchen LYRICA 75 MG capsule TAKE 1 CAPSULE BY MOUTH EVERYDAY AT BEDTIME (Patient taking differently: TAKE 75 MG BY MOUTH EVERYDAY AT BEDTIME) 30 capsule 2  . Naphazoline-Pheniramine (ALLERGY EYE OP) Place 1-2 drops into both eyes 2 (two) times daily as needed (ITCHY, WATERY EYES).    . ondansetron (ZOFRAN) 8 MG tablet Take 8 mg by mouth daily as needed (CAR SICKNESS).    Marland Kitchen PAZEO 0.7 % SOLN Place 1 drop into both eyes every morning.    . sodium chloride (OCEAN) 0.65 % SOLN nasal spray Place 1 spray into both nostrils as needed for congestion.    . sucralfate (CARAFATE) 1 GM/10ML suspension Take 10 mLs (1 g total) by mouth 4 (four) times daily. (Patient taking differently: Take 1 g by mouth 2 (two) times daily. ) 420 mL 1  . traMADol (ULTRAM) 50 MG tablet TAKE 1 TABLET BY MOUTH EVERY DAY AS NEEDED 30 tablet 3  . traZODone (DESYREL) 50 MG tablet Take 50 mg by mouth at bedtime.     No current facility-administered medications for this visit.     Review of Systems General: Fatigue, weakness Skin: Skin problems Eyes: no complaints HEENT: Sore throat, difficulty swallowing, bloody mucus Breasts: Lump in right breast Pulmonary: Shortness of breath, cough Cardiac: no complaints Gastrointestinal: Abdominal pain, abdominal bloating/distention, decreased appetite, nausea, constipation Genitourinary/Sexual: Vulvar irritation and itching Ob/Gyn: no complaints Musculoskeletal: Back pain, leg swelling Hematology: no complaints Neurologic/Psych: Numbness and tingling   Objective:  Physical Examination:  BP 132/82 (BP Location: Left Arm, Patient Position: Sitting, Cuff Size: Normal)   Pulse 98   Resp 16   Ht _0  (1.6 m)   Wt 165 lb 4.8 oz (75 kg)   BMI 29.28 kg/m    ECOG Performance Status: 0 -  Asymptomatic  General appearance: alert, cooperative and appears stated age HEENT:oropharynx clear, no lesions and thyroid without masses Lymph node survey: non-palpable, axillary, inguinal, supraclavicular Cardiovascular: regular rate and rhythm, no murmurs or gallops Respiratory: normal air entry, lungs clear to auscultation and no rales, rhonchi or wheezing Breast exam: not examined. Abdomen: soft, non-tender, without masses or organomegaly, no hernias and well healed incision Back: inspection of back is normal Extremities: extremities normal, atraumatic, no cyanosis or edema Skin exam - normal coloration and turgor, no rashes, no suspicious skin lesions noted. Neurological exam reveals alert, oriented, normal speech, no focal findings or movement disorder noted.  Pelvic: exam chaperoned by nurse;  Vulva: normal appearing vulva with no masses, tenderness or lesions; Vagina: normal vagina; Bimanual: normal; Rectal: not indicated and normal rectal, no masses   Assessment:  Julia Osborne is a 57 y.o. female with Lynch Syndrome due to MSH6 mutation and stage IV grade 2 endometrial cancer diagnosed 11/15 s/p TAH/BSO/nodes and 6 cycles of carboplatin/taxol chemotherapy for lung metastases.  NED since then.  Multiple somatic complaints today.  She had partial colectomy for high grade polyp in past. Last colonoscopy 5/18.   Medical co-morbidities complicating care:   Plan:   Problem List Items Addressed This Visit      Genitourinary   Endometrial cancer ([place]) -  Primary   Relevant Orders   CT Abdomen Pelvis W Contrast     We discussed options for management including close surveillance for recurrence of endometrial cancer with Gyn Onc and Med Onc at [facility]. In view of multiple complaints will do CT scan C/A to assess.  She is having low dose CT scan today for lung cancer screening in view of smoking.     Also reinforced need for surveillance for other Lynch associated cancers, most notably  colorectal cancer.  She will see Dr Vira Agar in GI later this month. Asked her to clarify with him whether colonoscopy should be done every year or every three years.  Up to Date suggests annual screening interval. She had colonoscopy just over a year ago.  Urine cytology negative 6/19.  Reinforced the importance of having her siblings and two children tested.  She does not want to communicate from siblings, who she is estranged from.  Will speak with her children over the holidays and I texted her a link with patient information about Lynch Syndrome to share with them.   We can see her back for follow up in 12 months for Gyn exam.     The patient's diagnosis, an outline of the further diagnostic and laboratory studies which will be required, the recommendation, and alternatives were discussed.  All questions were answered to the patient's satisfaction.  She will RTC in 1 year.  Verlon Au, NP  I personally interviewed and examined the patient. Agreed with the above/below plan of care. Patient/family questions were answered.  Mellody Drown, MD

## 2018-09-07 NOTE — Progress Notes (Signed)
In accordance with CMS guidelines, patient has met eligibility criteria including age, absence of signs or symptoms of lung cancer.  Social History   Tobacco Use  . Smoking status: Current Every Day Smoker    Packs/day: 0.25    Years: 25.00    Pack years: 6.25    Types: Cigarettes  . Smokeless tobacco: Never Used  . Tobacco comment: Is trying to quit/wean off.  Substance Use Topics  . Alcohol use: No  . Drug use: No     A shared decision-making session was conducted prior to the performance of CT scan. This includes one or more decision aids, includes benefits and harms of screening, follow-up diagnostic testing, over-diagnosis, false positive rate, and total radiation exposure.  Counseling on the importance of adherence to annual lung cancer LDCT screening, impact of co-morbidities, and ability or willingness to undergo diagnosis and treatment is imperative for compliance of the program.  Counseling on the importance of continued smoking cessation for former smokers; the importance of smoking cessation for current smokers, and information about tobacco cessation interventions have been given to patient including [place] and 1800 quit [place] programs.  Written order for lung cancer screening with LDCT has been given to the patient and any and all questions have been answered to the best of my abilities.   Yearly follow up will be coordinated by Burgess Estelle, Thoracic Navigator.  Faythe Casa, NP 09/07/2018 9:12 AM\

## 2018-09-07 NOTE — Progress Notes (Signed)
[place] CONSULT NOTE  Patient Care Team: System, Pcp Not In as PCP - General Penland, Kelby Fam, MD (Inactive) as PCP - Hematology/Oncology (Hematology and Oncology) Gala Romney Cristopher Estimable, MD as Consulting Physician (Gastroenterology)  CHIEF COMPLAINTS/PURPOSE OF CONSULTATION:  Endometrial cancer  #  Oncology History   # OCT 2015- Uterine ca- STAGE IV [multiple lung nodules]; Nov 2015S/p radical hysterectomy, BSO, pelvic lymph node dissection/vaginal biopsies/extensive pelvic disease including positive lymph nodes and positive distal vaginal biopsy.]; [[facility]]-endometroid G-2; LVI; positive Pelvic LN/vaginal Bx Jan-April 2016- carbo-Taxol x6 cycles [facility] Na; Dr.Penland]  # Oct 2016- colo- high grade dysplasia [Dr.Rourk; Dr.Jenkins s/p right hemi-colectomy]; last colo- may 2018;    [facility]Lynch [facility]  Histologic grade G2, histologic type endometrioid adenocarcinoma -----------------------------------------------------  DIAGNOSIS: Endometrioid endometrial cancer  STAGE: IV  ;GOALS: Control/palliative  CURRENT/MOST RECENT THERAPY surveillance      Endometrial cancer ([place])   07/12/2014 Imaging    CT C/A/P, pelvic adenopathy, multiple pulmonary nodules concerning for metastatic disease    07/30/2014 Initial Diagnosis    Endometrial cancer    07/31/2014 Pathology Results    endometrioid adenocarcinoma, G2, lymphovascular invasion present, positive pelvic lymph node and vaginal biopsy    07/31/2014 Definitive Surgery    radical hysterectomy, BSO, pelvic lymph node dissection and vaginal biopsies    09/25/2014 Procedure    Port-A-Cath placement in IR    10/18/2014 - 01/10/2015 Chemotherapy    Carboplatin/Taxol. First cycle given at [facility].  S/P 6 cycles total, 5 cycles given at [facility].    07/04/2015 Procedure    Colonoscopy by Dr. Gala Romney    07/04/2015 Pathology Results    Colon, polyp(s), vicinity of hepatic flexure - TUBULAR ADENOMA WITH  FOCAL HIGH GRADE DYSPLASIA.     Procedure    Partial colectomy by Dr. Arnoldo Morale scheduled for 08/30/2015    10/31/2015 Genetic Testing    [facility] SYNDROME MSH6 Mutation    04/27/2016 Imaging    CT CAP- No acute process or evidence of metastatic disease in the chest. Right middle lobe pulmonary nodule is unchanged back to 11/20/2014, favoring a benign etiology    11/10/2016 Imaging    CT CAP- 1. No evidence of metastatic disease in the chest, abdomen or pelvis. 2. No evidence of local tumor recurrence at the ileocolic anastomosis in the right abdomen or in the pelvis. Decreased mild fat stranding at the base of the right mesentery, most consistent with postsurgical scarring. 3. Aortic atherosclerosis.     Adenocarcinoma of colon ([place])   07/04/2015 Pathology Results    Diagnosis 1. Colon, polyp(s), vicinity of hepatic flexure - TUBULAR ADENOMA WITH FOCAL HIGH GRADE DYSPLASIA. - NO INVASIVE CARCINOMA. 2. Colon, polyp(s), sigmoid - HYPERPLASTIC POLYP. - NO DYSPLASIA OR MALIGNANCY.    07/04/2015 Procedure    Colonoscopoy by Dr. Gala Romney- large polypoid colonic lesion.    08/30/2015 Definitive Surgery    Dr. Arnoldo Morale- right segmental colon resection    08/30/2015 Pathology Results    [id number] intramucosal adenocarcinoma of colon, 0.2 cm inding the laminal propria with negative resection margins and 0/17 lymph nodes.      HISTORY OF PRESENTING ILLNESS:  Julia Osborne 57 y.o.  female history of endometrial cancer stage IV/Lynch syndrome-current on surveillance is here for follow-up.  Patient has not followed up with GI for follow-up EGD colonoscopy as discussed at last visit.  Patient complains of abdominal bloating distention lower abdominal pain for the last few months.  Denies any worsening constipation  or diarrhea.  Chronic mild tingling and numbness in the extremities.  Complains of pain in question lump in the right breast; she had recently fallen on that side and hurt her breast.  Also  complains of worsening reflux and at times food getting stuck.  Denies any weight loss.  Patient was seen by gynecology oncology this morning.   Review of Systems  Constitutional: Positive for malaise/fatigue. Negative for chills, diaphoresis, fever and weight loss.  HENT: Negative for sore throat.   Eyes: Negative for double vision.  Respiratory: Negative for cough, hemoptysis, sputum production, shortness of breath and wheezing.   Cardiovascular: Negative for chest pain, palpitations, orthopnea and leg swelling.  Gastrointestinal: Positive for abdominal pain. Negative for blood in stool, constipation, diarrhea, heartburn, melena, nausea and vomiting.  Genitourinary: Negative for dysuria, frequency and urgency.  Musculoskeletal: Positive for back pain and joint pain.  Skin: Negative.  Negative for itching and rash.  Neurological: Positive for tingling. Negative for dizziness, focal weakness, weakness and headaches.  Endo/Heme/Allergies: Does not bruise/bleed easily.  Psychiatric/Behavioral: Negative for depression. The patient is not nervous/anxious and does not have insomnia.      MEDICAL HISTORY:  Past Medical History:  Diagnosis Date  . Adenocarcinoma of colon ([place]) 09/13/2015  . Anemia   . Anxiety   . Asthma   . Cancer ([place])    endometrial; cancer cells in intestine  . COPD (chronic obstructive pulmonary disease) ([place])    no definite diagnosis  . Depression   . Diverticulitis   . Dyspareunia 05/16/2015  . Family history of cancer   . Family history of kidney cancer   . GERD (gastroesophageal reflux disease)   . Indigestion   . Lynch syndrome   . Neuropathy    feet and hands  . Uterine fibroid   . Vaginal dryness 05/16/2015  . Vaginal itching 07/16/2015  . Vaginal Pap smear, abnormal     SURGICAL HISTORY: Past Surgical History:  Procedure Laterality Date  . ABDOMINAL HYSTERECTOMY    . APPENDECTOMY    . BIOPSY N/A 03/14/2015   Procedure: BIOPSY;  Surgeon: Daneil Dolin, MD;  Location: [facility] OR[facility];  Service: Endoscopy;  Laterality: N/A;  Gastric  . COLONOSCOPY N/A 01/28/2017   Procedure: COLONOSCOPY;  Surgeon: Daneil Dolin, MD;  Location: [facility] SU[facility];  Service: Endoscopy;  Laterality: N/A;  11:30am  . COLONOSCOPY WITH PROPOFOL N/A 03/14/2015   RMR: Internal hemorrhoids. colonic diverticulosis. Incomplete examination. Prepartation inadequate.  . COLONOSCOPY WITH PROPOFOL N/A 07/04/2015   RMR: Colonic diverticulosis . Large polypoid lesion in the vicinity of the hepatic flexure status post saline-assisted piecmeal snare polypectomy  with ablation and tattooing as described. Sigmoid polyp removed as described above. sigmoid colon polyp hyperplastic, hepatic flexure polyp with TA with focal high grade dysplasia   . ESOPHAGEAL DILATION N/A 03/14/2015   Procedure: ESOPHAGEAL DILATION;  Surgeon: Daneil Dolin, MD;  Location: [facility];  Service: Endoscopy;  Laterality: N/A;  Maloney 59  . ESOPHAGOGASTRODUODENOSCOPY N/A 05/19/2016   Procedure: ESOPHAGOGASTRODUODENOSCOPY (EGD);  Surgeon: Daneil Dolin, MD;  Location: [facility];  Service: Endoscopy;  Laterality: N/A;  215  . ESOPHAGOGASTRODUODENOSCOPY N/A 01/28/2017   Procedure: ESOPHAGOGASTRODUODENOSCOPY (EGD);  Surgeon: Daneil Dolin, MD;  [facility];  Service: Endoscopy;  Laterality: N/A;  . ESOPHAGOGASTRODUODENOSCOPY (EGD) WITH PROPOFOL N/A 03/14/2015   RMR: Mild erosive reflux esophagitis status post passage o f a Maloney dilator. Abnormal gastric mucosa of uncertain significance as described above. status  post biopsy, benign  . ESOPHAGOGASTRODUODENOSCOPY (EGD) WITH PROPOFOL N/A 11/18/2017   Procedure: ESOPHAGOGASTRODUODENOSCOPY (EGD) WITH PROPOFOL;  Surgeon: Daneil Dolin, MD;  [facility];  Service: Endoscopy;  Laterality: N/A;  12:15pm  . MALONEY DILATION N/A 05/19/2016   Procedure: [name] DILATION;  Surgeon: Daneil Dolin, MD;  [facility];  Service: Endoscopy;   Laterality: N/A;  . [name] DILATION N/A 01/28/2017   Procedure: [name] DILATION;  Surgeon: Daneil Dolin, MD;  [facility];  Service: Endoscopy;  Laterality: N/A;  [name] DILATION N/A 11/18/2017   Procedure: [name] DILATION;  Surgeon: Daneil Dolin, MD;  [facility];  Service: Endoscopy;  Laterality: N/A;  . PARTIAL COLECTOMY  08/30/2015   polyp with adenocarcinoma  . POLYPECTOMY N/A 07/04/2015   Procedure: POLYPECTOMY;  Surgeon: Daneil Dolin, MD;  Location: [facility];  Service: Endoscopy;  Laterality: N/A;  . PORTACATH PLACEMENT Right 09/2014  . TUBAL LIGATION      SOCIAL HISTORY: Social History   Socioeconomic History  . Marital status: Married    Spouse name: Not on file  . Number of children: 2  . Years of education: Not on file  . Highest education level: Not on file  Occupational History  . Not on file  Social Needs  . Financial resource strain: Not on file  . Food insecurity:    Worry: Not on file    Inability: Not on file  . Transportation needs:    Medical: Not on file    Non-medical: Not on file  Tobacco Use  . Smoking status: Current Every Day Smoker    Packs/day: 2.00    Years: 25.00    Pack years: 50.00    Types: Cigarettes  . Smokeless tobacco: Never Used  . Tobacco comment: Is trying to quit/wean off.  Substance and Sexual Activity  . Alcohol use: No  . Drug use: No  . Sexual activity: Not Currently    Birth control/protection: Surgical    Comment: hyst  Lifestyle  . Physical activity:    Days per week: Not on file    Minutes per session: Not on file  . Stress: Not on file  Relationships  . Social connections:    Talks on phone: Not on file    Gets together: Not on file    Attends religious service: Not on file    Active member of club or organization: Not on file    Attends meetings of clubs or organizations: Not on file    Relationship status: Not on file  . Intimate partner violence:    Fear of current or ex  partner: Not on file    Emotionally abused: Not on file    Physically abused: Not on file    Forced sexual activity: Not on file  Other Topics Concern  . Not on file  Social History Narrative   #  lives in [place];self; smoking- 1/2 ppd; no alcohol; used to run machines;       FHx-Dad- MI; ? Cancer on autopsy; sisters/aunts- brain; lung cancer x2; oldest sister- kidney cancer    FAMILY HISTORY: Dad- MI; ? Cancer on autopsy; sisters/aunts- brain; lung cancer x2; oldest sister- kidney cancer Family History  Problem Relation Age of Onset  . Other Mother        clot that went to heart, deceased age 63ss  . Heart attack Father        age 89s, deceased  .  Stroke Father   . Cancer Father        "at death determined he was ate up with cancer"  . Hypertension Sister   . Kidney cancer Sister 13  . Diabetes Brother   . Hypertension Brother   . Endometriosis Daughter   . Heart attack Maternal Grandfather   . Diabetes Sister   . Brain cancer Paternal Aunt   . Cancer Paternal Uncle        NOS  . Cancer Paternal Uncle        NOS  . Cancer Paternal Uncle        NOS  . Colon cancer Neg Hx     ALLERGIES:  is allergic to codeine.  MEDICATIONS:  Current Outpatient Medications  Medication Sig Dispense Refill  . albuterol (PROVENTIL HFA;VENTOLIN HFA) 108 (90 Base) MCG/ACT inhaler Inhale 2 puffs into the lungs every 2 (two) hours as needed for wheezing or shortness of breath (cough). 1 Inhaler 3  . albuterol (PROVENTIL) (2.5 MG/3ML) 0.083% nebulizer solution Take 3 mLs (2.5 mg total) by nebulization every 6 (six) hours as needed for wheezing or shortness of breath. 75 mL 12  . ALPRAZolam (XANAX) 0.25 MG tablet Take 0.125 mg by mouth daily as needed for anxiety.     Marland Kitchen buPROPion (WELLBUTRIN SR) 150 MG 12 hr tablet Take 1 tablet by mouth daily.    . citalopram (CELEXA) 40 MG tablet Take 40 mg by mouth at bedtime.  2  . DEXILANT 60 MG capsule TAKE 1 CAPSULE BY MOUTH EVERY DAY 90 capsule 3  .  diazepam (VALIUM) 5 MG tablet Take 1 tablet (5 mg total) by mouth every 8 (eight) hours as needed for muscle spasms. 20 tablet 0  . LINZESS 145 MCG CAPS capsule TAKE 1 CAPSULE (145 MCG TOTAL) BY MOUTH DAILY BEFORE BREAKFAST. 90 capsule 3  . loratadine (CLARITIN) 10 MG tablet Take 10 mg by mouth daily as needed for allergies.     Marland Kitchen LUMIGAN 0.01 % SOLN Place 1 drop into the right eye at bedtime.    Marland Kitchen LYRICA 75 MG capsule TAKE 1 CAPSULE BY MOUTH EVERYDAY AT BEDTIME (Patient taking differently: TAKE 75 MG BY MOUTH EVERYDAY AT BEDTIME) 30 capsule 2  . Naphazoline-Pheniramine (ALLERGY EYE OP) Place 1-2 drops into both eyes 2 (two) times daily as needed (ITCHY, WATERY EYES).    . ondansetron (ZOFRAN) 8 MG tablet Take 8 mg by mouth daily as needed (CAR SICKNESS).    Marland Kitchen PAZEO 0.7 % SOLN Place 1 drop into both eyes every morning.    . sodium chloride (OCEAN) 0.65 % SOLN nasal spray Place 1 spray into both nostrils as needed for congestion.    . sucralfate (CARAFATE) 1 GM/10ML suspension Take 10 mLs (1 g total) by mouth 4 (four) times daily. (Patient taking differently: Take 1 g by mouth 2 (two) times daily. ) 420 mL 1  . traMADol (ULTRAM) 50 MG tablet TAKE 1 TABLET BY MOUTH EVERY DAY AS NEEDED 30 tablet 3  . traZODone (DESYREL) 50 MG tablet Take 50 mg by mouth at bedtime.     No current facility-administered medications for this visit.       Marland Kitchen  PHYSICAL EXAMINATION: ECOG PERFORMANCE STATUS: 0 - Asymptomatic  Vitals:   09/07/18 1105  BP: 132/82  Pulse: 98  Resp: 16   There were no vitals filed for this visit.  Physical Exam  Constitutional: She is oriented to person, place, and time and well-developed, well-nourished, and  in no distress.  Patient is alone.  HENT:  Head: Normocephalic and atraumatic.  Mouth/Throat: Oropharynx is clear and moist. No oropharyngeal exudate.  Eyes: Pupils are equal, round, and reactive to light.  Neck: Normal range of motion. Neck supple.  Cardiovascular: Normal  rate and regular rhythm.  Pulmonary/Chest: No respiratory distress. She has no wheezes.  Abdominal: Soft. Bowel sounds are normal. She exhibits no distension and no mass. There is no abdominal tenderness. There is no rebound and no guarding.  Musculoskeletal: Normal range of motion.        General: No tenderness or edema.  Neurological: She is alert and oriented to person, place, and time.  Skin: Skin is warm.  Right and left BREAST exam (in the presence of nurse)- no unusual skin changes or dominant masses felt.   Psychiatric: Affect normal.     LABORATORY DATA:  I have reviewed the data as listed Lab Results  Component Value Date   WBC 6.7 09/07/2018   HGB 12.4 09/07/2018   HCT 37.9 09/07/2018   MCV 90.9 09/07/2018   PLT 246 09/07/2018   Recent Labs    09/27/17 2349 01/06/18 1103 09/07/18 0931  NA 138 141 140  K 3.8 3.6 3.7  CL 103 104 105  CO2 _0 GLUCOSE 108* 105* 101*  BUN _1 CREATININE 0.95 0.70 0.93  CALCIUM 9.3 9.2 9.1  GFRNONAA >60 >60 >60  GFRAA >60 >60 >60  PROT 7.5 7.4 7.1  ALBUMIN 4.2 4.1 4.1  AST _2 ALT _3 ALKPHOS 55 64 54  BILITOT 0.3 0.4 0.3  BILIDIR 0.1  --   --   IBILI 0.2*  --   --     RADIOGRAPHIC STUDIES: I have personally reviewed the radiological images as listed and agreed with the findings in the report. Ct Chest Lung Cancer Screening Low Dose Wo Contrast  Result Date: 09/07/2018 CLINICAL DATA:  57 year old female current smoker with 46 pack year history of smoking. Lung cancer screening examination. EXAM: CT CHEST WITHOUT CONTRAST LOW-DOSE FOR LUNG CANCER SCREENING TECHNIQUE: Multidetector CT imaging of the chest was performed following the standard protocol without IV contrast. COMPARISON:  Chest CT 11/10/2016. FINDINGS: Cardiovascular: Heart size is normal. There is no significant pericardial fluid, thickening or pericardial calcification. Aortic atherosclerosis. No definite coronary artery calcifications.  Right internal jugular single-lumen porta cath with tip terminating in the right atrium. Mediastinum/Nodes: No pathologically enlarged mediastinal or hilar lymph nodes. Please note that accurate exclusion of hilar adenopathy is limited on noncontrast CT scans. Esophagus is unremarkable in appearance. No axillary lymphadenopathy. Lungs/Pleura: Small pulmonary nodules in the left lung, largest of which is a ground-glass attenuation nodule in the left upper lobe (axial image 78 of series 3), with a volume derived mean diameter of only 4.2 mm. No larger more suspicious appearing pulmonary nodules or masses are noted. No acute consolidative airspace disease. No pleural effusions. Upper Abdomen: Unremarkable. Musculoskeletal: There are no aggressive appearing lytic or blastic lesions noted in the visualized portions of the skeleton. IMPRESSION: 1. Lung-RADS 2, benign appearance or behavior. Continue annual screening with low-dose chest CT without contrast in 12 months. 2. Aortic atherosclerosis. Aortic Atherosclerosis (ICD10-I70.0). Electronically Signed   By: Vinnie Langton M.D.   On: 09/07/2018 15:34    ASSESSMENT & PLAN:   Endometrial cancer ([place]) # Uterine Cancer endometrioid type grade 2; stage IV [lung nodules at presentation 2015/]-given ongoing abdominal bloating/feeling poorly-question recurrence.  Await CT scans as ordered by gynecology oncology.  #Lynch syndrome /history of high-grade polyp/status post colectomy: given the history of Lynch syndrome-patient needs intensive surveillance colonoscopies; last colonoscopy-May 2018.  Patient noncompliant with GI follow-up.  Will refer to GI.  # Active smoker smoking cessation/ program quit smoking.  Not interested.  Stable awaiting lung screening CT scan today.  # Lynch syndrome-discussed the family implications of lung syndrome; patient currently has a 74 year old son [hardheaded]; daughter 32 years.  States that she spoke to her children.  But she has  spoke to one brother; but not spoken with other[ " dysfunctional family" as per patient]   Discussed with the patient to talk to the family regarding screening for Lynch syndrome.  Recommend: Annual skin exam; EGD every 2 to 3 years; UA/cytology  # PN- G-23/ now- 1; contonue tranadol 50 mg evry day prn; stable.  Discussed with gynecology oncology.  DISPOSITION: # referral to [facility]/ Lynch syndrome-difficulty swallowing.  # port flush in 3 weeks; no labs-MD- B   All questions were answered. The patient knows to call the clinic with any problems, questions or concerns.     Cammie Sickle, MD 09/08/2018 8:45 AM

## 2018-09-07 NOTE — Assessment & Plan Note (Addendum)
#   Uterine Cancer endometrioid type grade 2; stage IV [lung nodules at presentation 2015/]-given ongoing abdominal bloating/feeling poorly-question recurrence.  Await CT scans as ordered by gynecology oncology.  #Lynch syndrome /history of high-grade polyp/status post colectomy: given the history of Lynch syndrome-patient needs intensive surveillance colonoscopies; last colonoscopy-May 2018.  Patient noncompliant with GI follow-up.  Will refer to GI.  # Active smoker smoking cessation/ program quit smoking.  Not interested.  Stable awaiting lung screening CT scan today.  # Lynch syndrome-discussed the family implications of lung syndrome; patient currently has a 10 year old son [hardheaded]; daughter 81 years.  States that she spoke to her children.  But she has spoke to one brother; but not spoken with other[ " dysfunctional family" as per patient]   Discussed with the patient to talk to the family regarding screening for Lynch syndrome.  Recommend: Annual skin exam; EGD every 2 to 3 years; UA/cytology  # PN- G-23/ now- 1; contonue tranadol 50 mg evry day prn; stable.  Discussed with gynecology oncology.  DISPOSITION: # referral to [facility]/ Lynch syndrome-difficulty swallowing.  # port flush in 3 weeks; no labs-MD- B

## 2018-09-08 ENCOUNTER — Encounter: Payer: Self-pay | Admitting: Internal Medicine

## 2018-09-08 ENCOUNTER — Encounter: Payer: Self-pay | Admitting: *Deleted

## 2018-09-08 LAB — CA 125: CANCER ANTIGEN (CA) 125: 14.6 U/mL (ref 0.0–38.1)

## 2018-09-15 ENCOUNTER — Ambulatory Visit
Admission: RE | Admit: 2018-09-15 | Discharge: 2018-09-15 | Disposition: A | Discharge status: Home / Self Care | Payer: Medicare Other | Source: Ambulatory Visit | Attending: Nurse Practitioner | Admitting: Nurse Practitioner

## 2018-09-15 DIAGNOSIS — R195 Other fecal abnormalities: Secondary | ICD-10-CM | POA: Diagnosis not present

## 2018-09-15 DIAGNOSIS — K7689 Other specified diseases of liver: Secondary | ICD-10-CM | POA: Diagnosis not present

## 2018-09-15 DIAGNOSIS — C541 Malignant neoplasm of endometrium: Secondary | ICD-10-CM | POA: Insufficient documentation

## 2018-09-15 MED ORDER — IOPAMIDOL (ISOVUE-300) INJECTION 61%
100.0000 mL | Freq: Once | INTRAVENOUS | Status: AC | PRN
  Administered 2018-09-15: 100 mL via INTRAVENOUS

## 2018-09-28 ENCOUNTER — Inpatient Hospital Stay: Payer: Medicare Other | Attending: Internal Medicine

## 2018-09-28 ENCOUNTER — Encounter: Payer: Self-pay | Admitting: Internal Medicine

## 2018-09-28 ENCOUNTER — Inpatient Hospital Stay (HOSPITAL_BASED_OUTPATIENT_CLINIC_OR_DEPARTMENT_OTHER): Payer: Medicare Other | Admitting: Internal Medicine

## 2018-09-28 VITALS — BP 161/99 | HR 70 | Temp 97.1°F | Resp 16 | Wt 162.6 lb

## 2018-09-28 DIAGNOSIS — Z809 Family history of malignant neoplasm, unspecified: Secondary | ICD-10-CM | POA: Diagnosis not present

## 2018-09-28 DIAGNOSIS — F329 Major depressive disorder, single episode, unspecified: Secondary | ICD-10-CM | POA: Insufficient documentation

## 2018-09-28 DIAGNOSIS — G8929 Other chronic pain: Secondary | ICD-10-CM

## 2018-09-28 DIAGNOSIS — Z79899 Other long term (current) drug therapy: Secondary | ICD-10-CM | POA: Insufficient documentation

## 2018-09-28 DIAGNOSIS — I700 Atherosclerosis of aorta: Secondary | ICD-10-CM | POA: Diagnosis not present

## 2018-09-28 DIAGNOSIS — Z90710 Acquired absence of both cervix and uterus: Secondary | ICD-10-CM | POA: Insufficient documentation

## 2018-09-28 DIAGNOSIS — C541 Malignant neoplasm of endometrium: Secondary | ICD-10-CM

## 2018-09-28 DIAGNOSIS — J449 Chronic obstructive pulmonary disease, unspecified: Secondary | ICD-10-CM

## 2018-09-28 DIAGNOSIS — Z1509 Genetic susceptibility to other malignant neoplasm: Secondary | ICD-10-CM

## 2018-09-28 DIAGNOSIS — K5900 Constipation, unspecified: Secondary | ICD-10-CM | POA: Diagnosis not present

## 2018-09-28 DIAGNOSIS — G629 Polyneuropathy, unspecified: Secondary | ICD-10-CM

## 2018-09-28 DIAGNOSIS — R140 Abdominal distension (gaseous): Secondary | ICD-10-CM | POA: Diagnosis not present

## 2018-09-28 DIAGNOSIS — K219 Gastro-esophageal reflux disease without esophagitis: Secondary | ICD-10-CM

## 2018-09-28 DIAGNOSIS — M2550 Pain in unspecified joint: Secondary | ICD-10-CM

## 2018-09-28 DIAGNOSIS — F17210 Nicotine dependence, cigarettes, uncomplicated: Secondary | ICD-10-CM

## 2018-09-28 DIAGNOSIS — Z90722 Acquired absence of ovaries, bilateral: Secondary | ICD-10-CM | POA: Diagnosis not present

## 2018-09-28 DIAGNOSIS — M549 Dorsalgia, unspecified: Secondary | ICD-10-CM

## 2018-09-28 MED ORDER — SODIUM CHLORIDE 0.9% FLUSH
10.0000 mL | Freq: Once | INTRAVENOUS | Status: AC
  Administered 2018-09-28: 10 mL via INTRAVENOUS
  Filled 2018-09-28: qty 10

## 2018-09-28 MED ORDER — HEPARIN SOD (PORK) LOCK FLUSH 100 UNIT/ML IV SOLN
500.0000 [IU] | Freq: Once | INTRAVENOUS | Status: AC
  Administered 2018-09-28: 500 [IU] via INTRAVENOUS
  Filled 2018-09-28: qty 5

## 2018-09-28 NOTE — [place] Notes (Signed)
[place] CONSULT NOTE  Patient Care Team: System, Pcp Not In as PCP - General Penland, Kelby Fam, MD (Inactive) as PCP - Hematology/Oncology (Hematology and Oncology) Gala Romney Cristopher Estimable, MD as Consulting Physician (Gastroenterology)  CHIEF COMPLAINTS/PURPOSE OF CONSULTATION:  Endometrial cancer  #  Oncology History   # OCT 2015- Uterine ca- STAGE IV [multiple lung nodules]; Nov 2015S/p radical hysterectomy, BSO, pelvic lymph node dissection/vaginal biopsies/extensive pelvic disease including positive lymph nodes and positive distal vaginal biopsy.]; [WFB]-endometroid G-2; LVI; positive Pelvic LN/vaginal Bx Jan-April 2016- carbo-Taxol x6 cycles [facility] Na; Dr.Penland]  # Oct 2016- colo- high grade dysplasia [Dr.Rourk; Dr.Jenkins s/p right hemi-colectomy]; last colo- may 2018;    # Genetic testing- [MSH6 mutation]/Lynch syndrome; EGD/colo every 2-3 qyears; urin cytology q6M  Histologic grade G2, histologic type endometrioid adenocarcinoma -----------------------------------------------------  DIAGNOSIS: Endometrioid endometrial cancer  STAGE: IV  ;GOALS: Control/palliative  CURRENT/MOST RECENT THERAPY surveillance      Endometrial cancer ([place])   07/12/2014 Imaging    CT C/A/P, pelvic adenopathy, multiple pulmonary nodules concerning for metastatic disease    07/30/2014 Initial Diagnosis    Endometrial cancer    07/31/2014 Pathology Results    endometrioid adenocarcinoma, G2, lymphovascular invasion present, positive pelvic lymph node and vaginal biopsy    07/31/2014 Definitive Surgery    radical hysterectomy, BSO, pelvic lymph node dissection and vaginal biopsies    09/25/2014 Procedure    Port-A-Cath placement in IR    10/18/2014 - 01/10/2015 Chemotherapy    Carboplatin/Taxol. First cycle given at [facility].  S/P 6 cycles total, 5 cycles given at [facility].    07/04/2015 Procedure    Colonoscopy by Dr. Gala Romney    07/04/2015 Pathology Results    Colon, polyp(s),  vicinity of hepatic flexure - TUBULAR ADENOMA WITH FOCAL HIGH GRADE DYSPLASIA.     Procedure    Partial colectomy by Dr. Arnoldo Morale scheduled for 08/30/2015    10/31/2015 Genetic Testing    [facility] SYNDROME MSH6 Mutation    04/27/2016 Imaging    CT CAP- No acute process or evidence of metastatic disease in the chest. Right middle lobe pulmonary nodule is unchanged back to 11/20/2014, favoring a benign etiology    11/10/2016 Imaging    CT CAP- 1. No evidence of metastatic disease in the chest, abdomen or pelvis. 2. No evidence of local tumor recurrence at the ileocolic anastomosis in the right abdomen or in the pelvis. Decreased mild fat stranding at the base of the right mesentery, most consistent with postsurgical scarring. 3. Aortic atherosclerosis.     Adenocarcinoma of colon ([place])   07/04/2015 Pathology Results    Diagnosis 1. Colon, polyp(s), vicinity of hepatic flexure - TUBULAR ADENOMA WITH FOCAL HIGH GRADE DYSPLASIA. - NO INVASIVE CARCINOMA. 2. Colon, polyp(s), sigmoid - HYPERPLASTIC POLYP. - NO DYSPLASIA OR MALIGNANCY.    07/04/2015 Procedure    Colonoscopoy by Dr. Gala Romney- large polypoid colonic lesion.    08/30/2015 Definitive Surgery    Dr. Arnoldo Morale- right segmental colon resection    08/30/2015 Pathology Results    [id number] intramucosal adenocarcinoma of colon, 0.2 cm inding the laminal propria with negative resection margins and 0/17 lymph nodes.      HISTORY OF PRESENTING ILLNESS:  NEOMI LAIDLER 59 y.o.  female history of endometrial cancer stage IV/Lynch syndrome-current on surveillance is here for follow-up/review the results of the CT scan.  Patient states that she has been on Wellbutrin as per PCP; and notes to have decrease in the craving for smoking.  She  has significant on smoking.  Her appetite is fair.  Continues to have abdominal bloating.  Possible constipation.  She has an appointment with GI in about a month.  Continues to have chronic joint pains back pain.   She also has chronic tingling and numbness in extremities.   Review of Systems  Constitutional: Positive for malaise/fatigue. Negative for chills, diaphoresis, fever and weight loss.  HENT: Negative for sore throat.   Eyes: Negative for double vision.  Respiratory: Negative for cough, hemoptysis, sputum production, shortness of breath and wheezing.   Cardiovascular: Negative for chest pain, palpitations, orthopnea and leg swelling.  Gastrointestinal: Positive for abdominal pain. Negative for blood in stool, constipation, diarrhea, heartburn, melena, nausea and vomiting.  Genitourinary: Negative for dysuria, frequency and urgency.  Musculoskeletal: Positive for back pain and joint pain.  Skin: Negative.  Negative for itching and rash.  Neurological: Positive for tingling. Negative for dizziness, focal weakness, weakness and headaches.  Endo/Heme/Allergies: Does not bruise/bleed easily.  Psychiatric/Behavioral: Negative for depression. The patient is not nervous/anxious and does not have insomnia.      MEDICAL HISTORY:  Past Medical History:  Diagnosis Date  . Adenocarcinoma of colon ([place]) 09/13/2015   Partial colon resection and chemo tx's.   . Anemia   . Anxiety   . Asthma   . Cancer ([place])    endometrial; cancer cells in intestine  . COPD (chronic obstructive pulmonary disease) ([place])    no definite diagnosis  . Depression   . Diverticulitis   . Dyspareunia 05/16/2015  . Family history of cancer   . Family history of kidney cancer   . GERD (gastroesophageal reflux disease)   . Indigestion   . Lynch syndrome   . Neuropathy    feet and hands  . Uterine fibroid   . Vaginal dryness 05/16/2015  . Vaginal itching 07/16/2015  . Vaginal Pap smear, abnormal     SURGICAL HISTORY: Past Surgical History:  Procedure Laterality Date  . ABDOMINAL HYSTERECTOMY    . APPENDECTOMY    . BIOPSY N/A 03/14/2015   Procedure: BIOPSY;  Surgeon: Daneil Dolin, MD;  Location: [facility];  Service:  Endoscopy;  Laterality: N/A;  Gastric  . COLONOSCOPY N/A 01/28/2017   Procedure: COLONOSCOPY;  Surgeon: Daneil Dolin, MD;  Location: [facility];  Service: Endoscopy;  Laterality: N/A;  11:30am  . COLONOSCOPY WITH PROPOFOL N/A 03/14/2015   RMR: Internal hemorrhoids. colonic diverticulosis. Incomplete examination. Prepartation inadequate.  . COLONOSCOPY WITH PROPOFOL N/A 07/04/2015   RMR: Colonic diverticulosis . Large polypoid lesion in the vicinity of the hepatic flexure status post saline-assisted piecmeal snare polypectomy  with ablation and tattooing as described. Sigmoid polyp removed as described above. sigmoid colon polyp hyperplastic, hepatic flexure polyp with TA with focal high grade dysplasia   . ESOPHAGEAL DILATION N/A 03/14/2015   Procedure: ESOPHAGEAL DILATION;  Surgeon: Daneil Dolin, MD;  Location: [facility];  Service: Endoscopy;  Laterality: N/A;  Maloney 78  . ESOPHAGOGASTRODUODENOSCOPY N/A 05/19/2016   Procedure: ESOPHAGOGASTRODUODENOSCOPY (EGD);  Surgeon: Daneil Dolin, MD;  Location: [facility];  Service: Endoscopy;  Laterality: N/A;  215  . ESOPHAGOGASTRODUODENOSCOPY N/A 01/28/2017   Procedure: ESOPHAGOGASTRODUODENOSCOPY (EGD);  Surgeon: Daneil Dolin, MD;  [facility];  Service: Endoscopy;  Laterality: N/A;  . ESOPHAGOGASTRODUODENOSCOPY (EGD) WITH PROPOFOL N/A 03/14/2015   RMR: Mild erosive reflux esophagitis status post passage o f a Maloney dilator. Abnormal gastric mucosa of uncertain significance as described above. status post biopsy,  benign  . ESOPHAGOGASTRODUODENOSCOPY (EGD) WITH PROPOFOL N/A 11/18/2017   Procedure: ESOPHAGOGASTRODUODENOSCOPY (EGD) WITH PROPOFOL;  Surgeon: Daneil Dolin, MD;  [facility];  Service: Endoscopy;  Laterality: N/A;  12:15pm  . MALONEY DILATION N/A 05/19/2016   Procedure: [name] DILATION;  Surgeon: Daneil Dolin, MD;  [facility];  Service: Endoscopy;  Laterality: N/A;  . [name] DILATION N/A  01/28/2017   Procedure: [name] DILATION;  Surgeon: Daneil Dolin, MD;  [facility];  Service: Endoscopy;  Laterality: N/A;  [name] DILATION N/A 11/18/2017   Procedure: [name] DILATION;  Surgeon: Daneil Dolin, MD;  [facility];  Service: Endoscopy;  Laterality: N/A;  . PARTIAL COLECTOMY  08/30/2015   polyp with adenocarcinoma  . POLYPECTOMY N/A 07/04/2015   Procedure: POLYPECTOMY;  Surgeon: Daneil Dolin, MD;  Location: [facility];  Service: Endoscopy;  Laterality: N/A;  . PORTACATH PLACEMENT Right 09/2014  . TUBAL LIGATION      SOCIAL HISTORY: Social History   Socioeconomic History  . Marital status: Married    Spouse name: Not on file  . Number of children: 2  . Years of education: Not on file  . Highest education level: Not on file  Occupational History  . Not on file  Social Needs  . Financial resource strain: Not on file  . Food insecurity:    Worry: Not on file    Inability: Not on file  . Transportation needs:    Medical: Not on file    Non-medical: Not on file  Tobacco Use  . Smoking status: Current Every Day Smoker    Packs/day: 2.00    Years: 25.00    Pack years: 50.00    Types: Cigarettes  . Smokeless tobacco: Never Used  . Tobacco comment: Is trying to quit/wean off.  Substance and Sexual Activity  . Alcohol use: No  . Drug use: No  . Sexual activity: Not Currently    Birth control/protection: Surgical    Comment: hyst  Lifestyle  . Physical activity:    Days per week: Not on file    Minutes per session: Not on file  . Stress: Not on file  Relationships  . Social connections:    Talks on phone: Not on file    Gets together: Not on file    Attends religious service: Not on file    Active member of club or organization: Not on file    Attends meetings of clubs or organizations: Not on file    Relationship status: Not on file  . Intimate partner violence:    Fear of current or ex partner: Not on file    Emotionally abused:  Not on file    Physically abused: Not on file    Forced sexual activity: Not on file  Other Topics Concern  . Not on file  Social History Narrative   #  lives in [place];self; smoking- 1/2 ppd; no alcohol; used to run machines;       FHx-Dad- MI; ? Cancer on autopsy; sisters/aunts- brain; lung cancer x2; oldest sister- kidney cancer      Pt has 2 year old son;daughter 69 years. Brothers- states to have spoken to her family re: importance of them being checked for lynch.     FAMILY HISTORY: Dad- MI; ? Cancer on autopsy; sisters/aunts- brain; lung cancer x2; oldest sister- kidney cancer Family History  Problem Relation Age of Onset  . Other Mother  clot that went to heart, deceased age 72ss  . Heart attack Father        age 19s, deceased  . Stroke Father   . Cancer Father        "at death determined he was ate up with cancer"  . Hypertension Sister   . Kidney cancer Sister 34  . Diabetes Brother   . Hypertension Brother   . Endometriosis Daughter   . Heart attack Maternal Grandfather   . Diabetes Sister   . Brain cancer Paternal Aunt   . Cancer Paternal Uncle        NOS  . Cancer Paternal Uncle        NOS  . Cancer Paternal Uncle        NOS  . Colon cancer Neg Hx     ALLERGIES:  is allergic to codeine.  MEDICATIONS:  Current Outpatient Medications  Medication Sig Dispense Refill  . albuterol (PROVENTIL HFA;VENTOLIN HFA) 108 (90 Base) MCG/ACT inhaler Inhale 2 puffs into the lungs every 2 (two) hours as needed for wheezing or shortness of breath (cough). 1 Inhaler 3  . albuterol (PROVENTIL) (2.5 MG/3ML) 0.083% nebulizer solution Take 3 mLs (2.5 mg total) by nebulization every 6 (six) hours as needed for wheezing or shortness of breath. 75 mL 12  . ALPRAZolam (XANAX) 0.25 MG tablet Take 0.125 mg by mouth daily as needed for anxiety.     Marland Kitchen buPROPion (WELLBUTRIN SR) 150 MG 12 hr tablet Take 1 tablet by mouth daily.    . citalopram (CELEXA) 40 MG tablet Take 40 mg by  mouth at bedtime.  2  . DEXILANT 60 MG capsule TAKE 1 CAPSULE BY MOUTH EVERY DAY 90 capsule 3  . diazepam (VALIUM) 5 MG tablet Take 1 tablet (5 mg total) by mouth every 8 (eight) hours as needed for muscle spasms. 20 tablet 0  . LINZESS 145 MCG CAPS capsule TAKE 1 CAPSULE (145 MCG TOTAL) BY MOUTH DAILY BEFORE BREAKFAST. 90 capsule 3  . loratadine (CLARITIN) 10 MG tablet Take 10 mg by mouth daily as needed for allergies.     Marland Kitchen LUMIGAN 0.01 % SOLN Place 1 drop into the right eye at bedtime.    Marland Kitchen LYRICA 75 MG capsule TAKE 1 CAPSULE BY MOUTH EVERYDAY AT BEDTIME (Patient taking differently: TAKE 75 MG BY MOUTH EVERYDAY AT BEDTIME) 30 capsule 2  . Naphazoline-Pheniramine (ALLERGY EYE OP) Place 1-2 drops into both eyes 2 (two) times daily as needed (ITCHY, WATERY EYES).    . ondansetron (ZOFRAN) 8 MG tablet Take 8 mg by mouth daily as needed (CAR SICKNESS).    Marland Kitchen PAZEO 0.7 % SOLN Place 1 drop into both eyes every morning.    . sodium chloride (OCEAN) 0.65 % SOLN nasal spray Place 1 spray into both nostrils as needed for congestion.    . sucralfate (CARAFATE) 1 GM/10ML suspension Take 10 mLs (1 g total) by mouth 4 (four) times daily. (Patient taking differently: Take 1 g by mouth 2 (two) times daily. ) 420 mL 1  . traMADol (ULTRAM) 50 MG tablet TAKE 1 TABLET BY MOUTH EVERY DAY AS NEEDED 30 tablet 3  . traZODone (DESYREL) 50 MG tablet Take 50 mg by mouth at bedtime.     No current facility-administered medications for this visit.       Marland Kitchen  PHYSICAL EXAMINATION: ECOG PERFORMANCE STATUS: 0 - Asymptomatic  Vitals:   09/28/18 0931  BP: (!) 161/99  Pulse: 70  Resp: 16  Temp: (!) 97.1 F (36.2 C)   Filed Weights   09/28/18 0931  Weight: 162 lb 9.6 oz (73.8 kg)    Physical Exam  Constitutional: She is oriented to person, place, and time and well-developed, well-nourished, and in no distress.  Patient is alone.  HENT:  Head: Normocephalic and atraumatic.  Mouth/Throat: Oropharynx is clear and  moist. No oropharyngeal exudate.  Eyes: Pupils are equal, round, and reactive to light.  Neck: Normal range of motion. Neck supple.  Cardiovascular: Normal rate and regular rhythm.  Pulmonary/Chest: No respiratory distress. She has no wheezes.  Abdominal: Soft. Bowel sounds are normal. She exhibits no distension and no mass. There is no abdominal tenderness. There is no rebound and no guarding.  Musculoskeletal: Normal range of motion.        General: No tenderness or edema.  Neurological: She is alert and oriented to person, place, and time.  Skin: Skin is warm.  Right and left BREAST exam (in the presence of nurse)- no unusual skin changes or dominant masses felt.   Psychiatric: Affect normal.     LABORATORY DATA:  I have reviewed the data as listed Lab Results  Component Value Date   WBC 6.7 09/07/2018   HGB 12.4 09/07/2018   HCT 37.9 09/07/2018   MCV 90.9 09/07/2018   PLT 246 09/07/2018   Recent Labs    01/06/18 1103 09/07/18 0931  NA 141 140  K 3.6 3.7  CL 104 105  CO2 26 27  GLUCOSE 105* 101*  BUN 10 10  CREATININE 0.70 0.93  CALCIUM 9.2 9.1  GFRNONAA >60 >60  GFRAA >60 >60  PROT 7.4 7.1  ALBUMIN 4.1 4.1  AST 18 18  ALT 21 20  ALKPHOS 64 54  BILITOT 0.4 0.3    RADIOGRAPHIC STUDIES: I have personally reviewed the radiological images as listed and agreed with the findings in the report. Ct Abdomen Pelvis W Contrast  Result Date: 09/15/2018 CLINICAL DATA:  Restaging endometrial cancer. Initial diagnosis in 2015 with total hysterectomy, partial colon resection and chemotherapy. Underlying Lynch syndrome. EXAM: CT ABDOMEN AND PELVIS WITH CONTRAST TECHNIQUE: Multidetector CT imaging of the abdomen and pelvis was performed using the standard protocol following bolus administration of intravenous contrast. CONTRAST:  183m ISOVUE-300 IOPAMIDOL (ISOVUE-300) INJECTION 61% COMPARISON:  CT 09/28/2017 and 11/10/2016. FINDINGS: Lower chest: Clear lung bases. No significant  pleural or pericardial effusion. Hepatobiliary: 5 mm low-density lesion inferiorly in the right hepatic lobe (image 23/2) is unchanged from the previous study, better seen on the reformatted images previously. No new or enlarging hepatic lesions. The gallbladder is incompletely distended. No evidence of gallstones, gallbladder wall thickening or biliary dilatation. Pancreas: Unremarkable. No pancreatic ductal dilatation or surrounding inflammatory changes. Spleen: Normal in size without focal abnormality. Adrenals/Urinary Tract: Both adrenal glands appear normal. The kidneys appear normal without evidence of urinary tract calculus, suspicious lesion or hydronephrosis. No bladder abnormalities are seen. Stomach/Bowel: No evidence of bowel wall thickening, distention or surrounding inflammatory change. The ileocolonic anastomosis appears patent. There is prominent stool throughout the colon. Vascular/Lymphatic: There are no enlarged abdominal or pelvic lymph nodes. Mild aortic and branch vessel atherosclerosis. No acute vascular findings. Reproductive: Hysterectomy.  No adnexal mass. Other: Stable postsurgical changes in the anterior abdominal wall with a broad-based periumbilical hernia containing only fat. No ascites or peritoneal nodularity. Musculoskeletal: No acute or significant osseous findings. Stable mild lower lumbar spondylosis and stable bone island in the left iliac bone. IMPRESSION: 1. Stable examination without  evidence of local recurrence or metastatic disease. 2. Previous total hysterectomy and partial right colectomy. 3. Prominent stool throughout the colon, suggesting constipation. 4.  Aortic Atherosclerosis (ICD10-I70.0).f Electronically Signed   By: Richardean Sale M.D.   On: 09/15/2018 11:20   Ct Chest Lung Cancer Screening Low Dose Wo Contrast  Result Date: 09/07/2018 CLINICAL DATA:  58 year old female current smoker with 46 pack year history of smoking. Lung cancer screening examination.  EXAM: CT CHEST WITHOUT CONTRAST LOW-DOSE FOR LUNG CANCER SCREENING TECHNIQUE: Multidetector CT imaging of the chest was performed following the standard protocol without IV contrast. COMPARISON:  Chest CT 11/10/2016. FINDINGS: Cardiovascular: Heart size is normal. There is no significant pericardial fluid, thickening or pericardial calcification. Aortic atherosclerosis. No definite coronary artery calcifications. Right internal jugular single-lumen porta cath with tip terminating in the right atrium. Mediastinum/Nodes: No pathologically enlarged mediastinal or hilar lymph nodes. Please note that accurate exclusion of hilar adenopathy is limited on noncontrast CT scans. Esophagus is unremarkable in appearance. No axillary lymphadenopathy. Lungs/Pleura: Small pulmonary nodules in the left lung, largest of which is a ground-glass attenuation nodule in the left upper lobe (axial image 78 of series 3), with a volume derived mean diameter of only 4.2 mm. No larger more suspicious appearing pulmonary nodules or masses are noted. No acute consolidative airspace disease. No pleural effusions. Upper Abdomen: Unremarkable. Musculoskeletal: There are no aggressive appearing lytic or blastic lesions noted in the visualized portions of the skeleton. IMPRESSION: 1. Lung-RADS 2, benign appearance or behavior. Continue annual screening with low-dose chest CT without contrast in 12 months. 2. Aortic atherosclerosis. Aortic Atherosclerosis (ICD10-I70.0). Electronically Signed   By: Vinnie Langton M.D.   On: 09/07/2018 15:34    ASSESSMENT & PLAN:   Endometrial cancer ([place]) # Uterine Cancer endometrioid type grade 2; stage IV [lung nodules at presentation 2015/]-December 2019 CT scan abdomen pelvis/chest-negative for any obvious recurrence/positive for constipation.  # Lynch syndrome Ozzie Hoyle of high-grade polyp/status post colectomy: Last colonoscopy-May 2018.  Will also need EGD as part of surveillance.  Awaiting GI  evaluation- Feb 8th.   #Active smoker on Wellbutrin; cutting down on smoking.  Lung cancer screening December 2019- for any malignancy.  #Peripheral neuropathy grade 2-3; continue tramadol once a day as needed  # Constipation- recommend miralax; and dulcolax prn.   DISPOSITION: # port flush every 2 months # follow up in 6 months/labs- cbc/cmpca-125//urine cytology-Dr.B    All questions were answered. The patient knows to call the clinic with any problems, questions or concerns.     Cammie Sickle, MD 09/28/2018 12:18 PM

## 2018-09-28 NOTE — Assessment & Plan Note (Addendum)
#   Uterine Cancer endometrioid type grade 2; stage IV [lung nodules at presentation 2015/]-December 2019 CT scan abdomen pelvis/chest-negative for any obvious recurrence/positive for constipation.  # Lynch syndrome Ozzie Hoyle of high-grade polyp/status post colectomy: Last colonoscopy-May 2018.  Will also need EGD as part of surveillance.  Awaiting GI evaluation- Feb 8th.   #Active smoker on Wellbutrin; cutting down on smoking.  Lung cancer screening December 2019- for any malignancy.  #Peripheral neuropathy grade 2-3; continue tramadol once a day as needed  # Constipation- recommend miralax; and dulcolax prn.   DISPOSITION: # port flush every 2 months # follow up in 6 months/labs- cbc/cmpca-125//urine cytology-Dr.B

## 2018-10-07 DIAGNOSIS — F514 Sleep terrors [night terrors]: Secondary | ICD-10-CM | POA: Diagnosis not present

## 2018-10-07 DIAGNOSIS — J069 Acute upper respiratory infection, unspecified: Secondary | ICD-10-CM | POA: Diagnosis not present

## 2018-10-07 DIAGNOSIS — F411 Generalized anxiety disorder: Secondary | ICD-10-CM | POA: Diagnosis not present

## 2018-10-07 DIAGNOSIS — Z6828 Body mass index (BMI) 28.0-28.9, adult: Secondary | ICD-10-CM | POA: Diagnosis not present

## 2018-10-11 DIAGNOSIS — H1045 Other chronic allergic conjunctivitis: Secondary | ICD-10-CM | POA: Diagnosis not present

## 2018-10-11 DIAGNOSIS — H401112 Primary open-angle glaucoma, right eye, moderate stage: Secondary | ICD-10-CM | POA: Diagnosis not present

## 2018-10-11 DIAGNOSIS — H40052 Ocular hypertension, left eye: Secondary | ICD-10-CM | POA: Diagnosis not present

## 2018-10-16 ENCOUNTER — Other Ambulatory Visit: Payer: Self-pay | Admitting: Oncology

## 2018-10-16 DIAGNOSIS — C189 Malignant neoplasm of colon, unspecified: Secondary | ICD-10-CM

## 2018-10-16 DIAGNOSIS — C541 Malignant neoplasm of endometrium: Secondary | ICD-10-CM

## 2018-10-17 ENCOUNTER — Other Ambulatory Visit: Payer: Self-pay | Admitting: Nurse Practitioner

## 2018-10-17 DIAGNOSIS — C189 Malignant neoplasm of colon, unspecified: Secondary | ICD-10-CM

## 2018-10-17 DIAGNOSIS — C541 Malignant neoplasm of endometrium: Secondary | ICD-10-CM

## 2018-10-17 MED ORDER — TRAMADOL HCL 50 MG PO TABS: 50.0000 mg | ORAL_TABLET | Freq: Every day | ORAL | 3 refills | Status: DC | PRN

## 2018-10-17 NOTE — Telephone Encounter (Signed)
Narcotic refill request: Tramadol  As mandated by the [place] STOP Act (Strenghtenn Opioid Misuse Prevention), the [employer] reviewed prior to consideration of refills as below:    Given a current oncology diagnosis, this patient has the potential to experience significant cancer related pain. Benefits versus risks associated with continued therapy considered. Will continue pain management with opioids as previously prescribed.   Patient educated that medications should not be bitten, chewed, or crushed. Additionally, safety precautions reviewed. Patient verbalized understanding that medications should not be sold or shared, taken with alcohol, or used while driving. She has been made aware of the side effects of using this medication. Patient understands that this medication can cause CNS depression, increase her risk of falls, and even lead to overdose that may result in death, if used outside of the parameters that she and I discussed.   With all of this in mind, she accepts the risks and responsibilities associated with therapy and elects to continue to use the prescribed interventions. As supervising physician, Dr. Rogue Bussing, agrees that continuation of opioid therapy is medically appropriate at this time and agrees to provide continual monitoring, including urine/blood drug screens, as indicated.   Refill prescription sent electronically using [employer] secure transmission to requested pharmacy:  1. Tramadol  Beckey Rutter, DNP, AGNP-C [place] [facility] [phone] (work cell) [phone] (office)

## 2018-10-24 ENCOUNTER — Other Ambulatory Visit: Payer: Self-pay

## 2018-10-24 ENCOUNTER — Ambulatory Visit (INDEPENDENT_AMBULATORY_CARE_PROVIDER_SITE_OTHER): Payer: Medicare Other | Admitting: Gastroenterology

## 2018-10-24 ENCOUNTER — Encounter: Payer: Self-pay | Admitting: Gastroenterology

## 2018-10-24 VITALS — BP 114/70 | HR 65 | Ht 63.0 in | Wt 157.8 lb

## 2018-10-24 DIAGNOSIS — R1310 Dysphagia, unspecified: Secondary | ICD-10-CM | POA: Diagnosis not present

## 2018-10-24 DIAGNOSIS — K2100 Gastro-esophageal reflux disease with esophagitis, without bleeding: Secondary | ICD-10-CM

## 2018-10-24 DIAGNOSIS — Z1509 Genetic susceptibility to other malignant neoplasm: Secondary | ICD-10-CM | POA: Diagnosis not present

## 2018-10-24 DIAGNOSIS — K5900 Constipation, unspecified: Secondary | ICD-10-CM | POA: Diagnosis not present

## 2018-10-24 MED ORDER — SUCRALFATE 1 GM/10ML PO SUSP: 1.0000 g | Freq: Four times a day (QID) | ORAL | 1 refills | Status: DC

## 2018-10-24 NOTE — Progress Notes (Signed)
Julia Bellows MD, MRCP(U.K) [address]  [place]  [place],  [address]  Main: [phone]  Fax: [phone]   Gastroenterology Consultation  Referring Provider:     Cammie Osborne, * Primary Care Physician:  Julia Pali, NP Primary Gastroenterologist:  Dr. Jonathon Osborne  Reason for Consultation:     Dysphagia, Lunch Syndrome         HPI:   Julia Osborne is a 58 y.o. y/o female referred for consultation & management  by Dr. Chauncy Osborne Julia Rummage, NP.     She has been referred by Dr. Rogue Osborne  for Lynch syndrome and difficulty swallowing.  She has been previously established with [facility] gastroenterology and last seen in February 2019.  Carries a history of dysphagia, GERD and CA of her colon, status post right hemicolectomy.  History of stage IV uterine cancer.  Last EGD was performed in February 2019, esophagus was reported as normal and dilation was performed at [facility] dilator.   Colonoscopy performed in May 2018 showed no polyps. Colon polyp resected in October 2016 showed an adenoma with focal high-grade dysplasia.  Hemoglobin in December 2019 was 12.4 g.  She follows with Dr. Rogue Osborne for uterine cancer.  Active smoker.  I do not seen any evaluation for EOE or achalasia in the past.    Dysphagia: Onset and any progression: last 4 years - getting worse  Frequency: every meals Foods affected : solids and liquids  Prior episodes of impaction: no - gets stuck at her throat  History of asthma/allergy : yes  History of heartburn/Reflux : COPD, severe allergies  Weight loss/weight gain : losing weight   Prior EGD: yes -never had esophageal manometry  PPI/H2 blocker use : On Dexilant and carafate -Helps with her reflux. , recently GERD worse  Smoking history : yes.   Uses tramadol.  Mom and oldest sister has lynch syndrome- kids not screened yet.   Suffers from severe constipation - on linzess 145 mcg - noit having good results.   Past Medical  History:  Diagnosis Date  . Adenocarcinoma of colon ([place]) 09/13/2015   Partial colon resection and chemo tx's.   . Anemia   . Anxiety   . Asthma   . Cancer ([place])    endometrial; cancer cells in intestine  . COPD (chronic obstructive pulmonary disease) ([place])    no definite diagnosis  . Depression   . Diverticulitis   . Dyspareunia 05/16/2015  . Family history of cancer   . Family history of kidney cancer   . GERD (gastroesophageal reflux disease)   . Indigestion   . Lynch syndrome   . Neuropathy    feet and hands  . Uterine fibroid   . Vaginal dryness 05/16/2015  . Vaginal itching 07/16/2015  . Vaginal Pap smear, abnormal     Past Surgical History:  Procedure Laterality Date  . ABDOMINAL HYSTERECTOMY    . APPENDECTOMY    . BIOPSY N/A 03/14/2015   Procedure: BIOPSY;  Surgeon: Daneil Dolin, MD;  Location: [facility] ORS;  Service: Endoscopy;  Laterality: N/A;  Gastric  . COLONOSCOPY N/A 01/28/2017   Procedure: COLONOSCOPY;  Surgeon: Daneil Dolin, MD;  Location: [facility] END[facility] SUITE;  Service: Endoscopy;  Laterality: N/A;  11:30am  . COLONOSCOPY WITH PROPOFOL N/A 03/14/2015   RMR: Internal hemorrhoids. colonic diverticulosis. Incomplete examination. Prepartation inadequate.  . COLONOSCOPY WITH PROPOFOL N/A 07/04/2015   RMR: Colonic diverticulosis . Large polypoid lesion in the vicinity of  the hepatic flexure status post saline-assisted piecmeal snare polypectomy  with ablation and tattooing as described. Sigmoid polyp removed as described above. sigmoid colon polyp hyperplastic, hepatic flexure polyp with TA with focal high grade dysplasia   . ESOPHAGEAL DILATION N/A 03/14/2015   Procedure: ESOPHAGEAL DILATION;  Surgeon: Daneil Dolin, MD;  Location: [facility];  Service: Endoscopy;  Laterality: N/A;  [place] [address]  . ESOPHAGOGASTRODUODENOSCOPY N/A 05/19/2016   Procedure: ESOPHAGOGASTRODUODENOSCOPY (EGD);  Surgeon: Daneil Dolin, MD;  Location: [facility];  Service: Endoscopy;  Laterality: N/A;   215  . ESOPHAGOGASTRODUODENOSCOPY N/A 01/28/2017   Procedure: ESOPHAGOGASTRODUODENOSCOPY (EGD);  Surgeon: Daneil Dolin, MD;  [facility];  Service: Endoscopy;  Laterality: N/A;  . ESOPHAGOGASTRODUODENOSCOPY (EGD) WITH PROPOFOL N/A 03/14/2015   RMR: Mild erosive reflux esophagitis status post passage o f a [place] dilator. Abnormal gastric mucosa of uncertain significance as described above. status post biopsy, benign  . ESOPHAGOGASTRODUODENOSCOPY (EGD) WITH PROPOFOL N/A 11/18/2017   Procedure: ESOPHAGOGASTRODUODENOSCOPY (EGD) WITH PROPOFOL;  Surgeon: Daneil Dolin, MD;  [facility];  Service: Endoscopy;  Laterality: N/A;  12:15pm  . MALONEY DILATION N/A 05/19/2016   Procedure: [name] DILATION;  Surgeon: Daneil Dolin, MD;  [facility];  Service: Endoscopy;  Laterality: N/A;  . [name] DILATION N/A 01/28/2017   Procedure: [name] DILATION;  Surgeon: Daneil Dolin, MD;  [facility];  Service: Endoscopy;  Laterality: N/A;  [name] DILATION N/A 11/18/2017   Procedure: [name] DILATION;  Surgeon: Daneil Dolin, MD;  [facility];  Service: Endoscopy;  Laterality: N/A;  . PARTIAL COLECTOMY  08/30/2015   polyp with adenocarcinoma  . POLYPECTOMY N/A 07/04/2015   Procedure: POLYPECTOMY;  Surgeon: Daneil Dolin, MD;  Location: [facility];  Service: Endoscopy;  Laterality: N/A;  . PORTACATH PLACEMENT Right 09/2014  . TUBAL LIGATION      Prior to Admission medications   Medication Sig Start Date End Date Taking? Authorizing Provider  albuterol (PROVENTIL HFA;VENTOLIN HFA) 108 (90 Base) MCG/ACT inhaler Inhale 2 puffs into the lungs every 2 (two) hours as needed for wheezing or shortness of breath (cough). 10/17/15   Annitta Needs, NP  albuterol (PROVENTIL) (2.5 MG/3ML) 0.083% nebulizer solution Take 3 mLs (2.5 mg total) by nebulization every 6 (six) hours as needed for wheezing or shortness of breath. 01/16/16   Penland, Kelby Fam, MD  ALPRAZolam  Duanne Moron) 0.25 MG tablet Take 0.125 mg by mouth daily as needed for anxiety.     [provider]  buPROPion (WELLBUTRIN SR) 150 MG 12 hr tablet Take 1 tablet by mouth daily. 07/25/18   [provider]  citalopram (CELEXA) 40 MG tablet Take 40 mg by mouth at bedtime. 01/05/17   [provider]  DEXILANT 60 MG capsule TAKE 1 CAPSULE BY MOUTH EVERY DAY 01/10/18   Annitta Needs, NP  diazepam (VALIUM) 5 MG tablet Take 1 tablet (5 mg total) by mouth every 8 (eight) hours as needed for muscle spasms. 09/04/17   Earleen Newport, MD  LINZESS 145 MCG CAPS capsule TAKE 1 CAPSULE (145 MCG TOTAL) BY MOUTH DAILY BEFORE BREAKFAST. 12/23/17   Mahala Menghini, PA-C  loratadine (CLARITIN) 10 MG tablet Take 10 mg by mouth daily as needed for allergies.     [provider]  LUMIGAN 0.01 % SOLN Place 1 drop into the right eye at bedtime. 06/24/18   [provider]  LYRICA 75 MG capsule  TAKE 1 CAPSULE BY MOUTH EVERYDAY AT BEDTIME Patient taking differently: TAKE 75 MG BY MOUTH EVERYDAY AT BEDTIME 10/24/17   Holley Bouche, NP  Naphazoline-Pheniramine (ALLERGY EYE OP) Place 1-2 drops into both eyes 2 (two) times daily as needed (ITCHY, WATERY EYES).    [provider]  ondansetron (ZOFRAN) 8 MG tablet Take 8 mg by mouth daily as needed (CAR SICKNESS).    [provider]  PAZEO 0.7 % SOLN Place 1 drop into both eyes every morning. 06/27/18   [provider]  sodium chloride (OCEAN) 0.65 % SOLN nasal spray Place 1 spray into both nostrils as needed for congestion.    [provider]  sucralfate (CARAFATE) 1 GM/10ML suspension Take 10 mLs (1 g total) by mouth 4 (four) times daily. Patient taking differently: Take 1 g by mouth 2 (two) times daily.  10/22/17   Rourk, Cristopher Estimable, MD  traMADol (ULTRAM) 50 MG tablet Take 1 tablet (50 mg total) by mouth daily as needed. 10/17/18   Verlon Au, NP  traZODone (DESYREL) 50 MG tablet Take 50 mg by mouth at  bedtime.    [provider]    Family History  Problem Relation Age of Onset  . Other Mother        clot that went to heart, deceased age 76ss  . Heart attack Father        age 77s, deceased  . Stroke Father   . Cancer Father        "at death determined he was ate up with cancer"  . Hypertension Sister   . Kidney cancer Sister 33  . Diabetes Brother   . Hypertension Brother   . Endometriosis Daughter   . Heart attack Maternal Grandfather   . Diabetes Sister   . Brain cancer Paternal Aunt   . Cancer Paternal Uncle        NOS  . Cancer Paternal Uncle        NOS  . Cancer Paternal Uncle        NOS  . Colon cancer Neg Hx      Social History   Tobacco Use  . Smoking status: Current Every Day Smoker    Packs/day: 2.00    Years: 25.00    Pack years: 50.00    Types: Cigarettes  . Smokeless tobacco: Never Used  . Tobacco comment: Is trying to quit/wean off.  Substance Use Topics  . Alcohol use: No  . Drug use: No    Allergies as of 10/24/2018 - Review Complete 09/28/2018  Allergen Reaction Noted  . Codeine Rash 08/25/2011    Review of Systems:    All systems reviewed and negative except where noted in HPI.   Physical Exam:  There were no vitals taken for this visit. No LMP recorded. Patient has had a hysterectomy. Psych:  Alert and cooperative. Normal mood and affect. General:   Alert,  Well-developed, well-nourished, pleasant and cooperative in NAD Head:  Normocephalic and atraumatic. Eyes:  Sclera clear, no icterus.   Conjunctiva pink. Ears:  Normal auditory acuity. Nose:  No deformity, discharge, or lesions. Mouth:  No deformity or lesions,oropharynx pink & moist. Neck:  Supple; no masses or thyromegaly. Lungs:  Respirations even and unlabored.  Clear throughout to auscultation.   No wheezes, crackles, or rhonchi. No acute distress. Heart:  Regular rate and rhythm; no murmurs, clicks, rubs, or gallops. Abdomen:  Normal bowel sounds.  No bruits.   Soft, non-tender and non-distended  without masses, hepatosplenomegaly or hernias noted.  No guarding or rebound tenderness.    Msk:  Symmetrical without gross deformities. Good, equal movement & strength bilaterally. Pulses:  Normal pulses noted. Extremities:  No clubbing or edema.  No cyanosis. Neurologic:  Alert and oriented x3;  grossly normal neurologically. Skin:  Intact without significant lesions or rashes. No jaundice. Lymph Nodes:  No significant cervical adenopathy. Psych:  Alert and cooperative. Normal mood and affect.  Imaging Studies: No results found.  Assessment and Plan:   ALAJIA SCHMELZER is a 58 y.o. y/o female has been referred for  dysphagia and Lynch syndrome.  Previously managed at an outside GI and she moved and hence wants to establish care and the [place] area.  History of colon cancer with right hemicolectomy, uterine cancer.  Discussed cancer surveillance in the setting of Lynch syndrome.  1.  Stop smoking 2.  EGD every 2 to 3 years screen for gastric cancer and I will screen her for H. Pylori. 3.  NCCN recommends capsule study of the small bowel tumors but other  societies do not 4.. Requires annual skin exam to screen for skin cancer.Will refer to dermatology  5.  Annual urine analysis 6.  Colonoscopy every 2 to 3 years which would be this year. Will perform EGD at same time for dysphagia . If EGD is negative will get esophageal manometry.  7.  Recommend her first-degree relatives to also be screened for Lynch syndrome. 8. Suggest to try and stop Tramadol as it may be causing worsening of GERD.  9. Increase dose of linzess to 290 mcg- samples provided   I have discussed alternative options, risks & benefits,  which include, but are not limited to, bleeding, infection, perforation,respiratory complication & drug reaction.  The patient agrees with this plan & written consent will be obtained.    Follow up in 8 weeks   Dr Julia Bellows MD,MRCP(U.K)

## 2018-10-26 ENCOUNTER — Encounter: Payer: Self-pay | Admitting: Gastroenterology

## 2018-10-26 LAB — H. PYLORI BREATH TEST: H pylori Breath Test: NEGATIVE

## 2018-11-02 ENCOUNTER — Encounter: Payer: Self-pay | Admitting: *Deleted

## 2018-11-03 ENCOUNTER — Ambulatory Visit
Admission: RE | Admit: 2018-11-03 | Discharge: 2018-11-03 | Disposition: A | Discharge status: Home / Self Care | Payer: Medicare Other | Attending: Gastroenterology | Admitting: Gastroenterology

## 2018-11-03 ENCOUNTER — Encounter: Payer: Self-pay | Admitting: Student

## 2018-11-03 ENCOUNTER — Encounter
Admission: RE | Disposition: A | Discharge status: Home / Self Care | Payer: Self-pay | Source: Home / Self Care | Attending: Gastroenterology

## 2018-11-03 ENCOUNTER — Other Ambulatory Visit: Payer: Self-pay

## 2018-11-03 ENCOUNTER — Ambulatory Visit: Payer: Medicare Other | Admitting: Certified Registered Nurse Anesthetist

## 2018-11-03 DIAGNOSIS — F419 Anxiety disorder, unspecified: Secondary | ICD-10-CM | POA: Insufficient documentation

## 2018-11-03 DIAGNOSIS — G629 Polyneuropathy, unspecified: Secondary | ICD-10-CM | POA: Diagnosis not present

## 2018-11-03 DIAGNOSIS — Z8542 Personal history of malignant neoplasm of other parts of uterus: Secondary | ICD-10-CM | POA: Diagnosis not present

## 2018-11-03 DIAGNOSIS — K3189 Other diseases of stomach and duodenum: Secondary | ICD-10-CM | POA: Insufficient documentation

## 2018-11-03 DIAGNOSIS — Z809 Family history of malignant neoplasm, unspecified: Secondary | ICD-10-CM | POA: Insufficient documentation

## 2018-11-03 DIAGNOSIS — Z9221 Personal history of antineoplastic chemotherapy: Secondary | ICD-10-CM | POA: Insufficient documentation

## 2018-11-03 DIAGNOSIS — R1310 Dysphagia, unspecified: Secondary | ICD-10-CM | POA: Diagnosis not present

## 2018-11-03 DIAGNOSIS — F17210 Nicotine dependence, cigarettes, uncomplicated: Secondary | ICD-10-CM | POA: Diagnosis not present

## 2018-11-03 DIAGNOSIS — K219 Gastro-esophageal reflux disease without esophagitis: Secondary | ICD-10-CM | POA: Insufficient documentation

## 2018-11-03 DIAGNOSIS — J449 Chronic obstructive pulmonary disease, unspecified: Secondary | ICD-10-CM | POA: Insufficient documentation

## 2018-11-03 DIAGNOSIS — Z1509 Genetic susceptibility to other malignant neoplasm: Secondary | ICD-10-CM | POA: Diagnosis not present

## 2018-11-03 DIAGNOSIS — D132 Benign neoplasm of duodenum: Secondary | ICD-10-CM | POA: Diagnosis not present

## 2018-11-03 DIAGNOSIS — K640 First degree hemorrhoids: Secondary | ICD-10-CM | POA: Diagnosis not present

## 2018-11-03 DIAGNOSIS — Z79899 Other long term (current) drug therapy: Secondary | ICD-10-CM | POA: Insufficient documentation

## 2018-11-03 DIAGNOSIS — F329 Major depressive disorder, single episode, unspecified: Secondary | ICD-10-CM | POA: Diagnosis not present

## 2018-11-03 DIAGNOSIS — K5900 Constipation, unspecified: Secondary | ICD-10-CM | POA: Diagnosis not present

## 2018-11-03 DIAGNOSIS — F418 Other specified anxiety disorders: Secondary | ICD-10-CM | POA: Diagnosis not present

## 2018-11-03 DIAGNOSIS — K621 Rectal polyp: Secondary | ICD-10-CM | POA: Diagnosis not present

## 2018-11-03 DIAGNOSIS — K210 Gastro-esophageal reflux disease with esophagitis: Secondary | ICD-10-CM | POA: Diagnosis not present

## 2018-11-03 DIAGNOSIS — Z85038 Personal history of other malignant neoplasm of large intestine: Secondary | ICD-10-CM | POA: Diagnosis not present

## 2018-11-03 DIAGNOSIS — K635 Polyp of colon: Secondary | ICD-10-CM | POA: Diagnosis not present

## 2018-11-03 DIAGNOSIS — K317 Polyp of stomach and duodenum: Secondary | ICD-10-CM | POA: Diagnosis not present

## 2018-11-03 DIAGNOSIS — K2950 Unspecified chronic gastritis without bleeding: Secondary | ICD-10-CM | POA: Insufficient documentation

## 2018-11-03 HISTORY — DX: Unspecified glaucoma: H40.9

## 2018-11-03 HISTORY — PX: COLONOSCOPY WITH PROPOFOL: SHX5780

## 2018-11-03 HISTORY — PX: ESOPHAGOGASTRODUODENOSCOPY (EGD) WITH PROPOFOL: SHX5813

## 2018-11-03 SURGERY — COLONOSCOPY WITH PROPOFOL
Anesthesia: General

## 2018-11-03 MED ORDER — EPHEDRINE SULFATE 50 MG/ML IJ SOLN
INTRAMUSCULAR | Status: AC
  Filled 2018-11-03: qty 1

## 2018-11-03 MED ORDER — PROPOFOL 10 MG/ML IV BOLUS
INTRAVENOUS | Status: AC
  Filled 2018-11-03: qty 80

## 2018-11-03 MED ORDER — PROPOFOL 500 MG/50ML IV EMUL
INTRAVENOUS | Status: DC | PRN
  Administered 2018-11-03: 100 ug/kg/min via INTRAVENOUS

## 2018-11-03 MED ORDER — LIDOCAINE HCL (PF) 2 % IJ SOLN
INTRAMUSCULAR | Status: AC
  Filled 2018-11-03: qty 10

## 2018-11-03 MED ORDER — PROPOFOL 10 MG/ML IV BOLUS
INTRAVENOUS | Status: DC | PRN
  Administered 2018-11-03 (×2): 30 mg via INTRAVENOUS
  Administered 2018-11-03: 70 mg via INTRAVENOUS

## 2018-11-03 MED ORDER — EPHEDRINE SULFATE 50 MG/ML IJ SOLN
INTRAMUSCULAR | Status: DC | PRN
  Administered 2018-11-03 (×2): 5 mg via INTRAVENOUS

## 2018-11-03 MED ORDER — SODIUM CHLORIDE 0.9 % IV SOLN
INTRAVENOUS | Status: DC
  Administered 2018-11-03: 1000 mL via INTRAVENOUS
  Administered 2018-11-03: 10:00:00 via INTRAVENOUS

## 2018-11-03 NOTE — Anesthesia Post-op Follow-up Note (Signed)
Anesthesia QCDR form completed.        

## 2018-11-03 NOTE — Anesthesia Postprocedure Evaluation (Signed)
Anesthesia Post Note  Patient: LEENA TIEDE  Procedure(s) Performed: COLONOSCOPY WITH PROPOFOL (N/A ) ESOPHAGOGASTRODUODENOSCOPY (EGD) WITH PROPOFOL (N/A )  Patient location during evaluation: Endoscopy Anesthesia Type: General Level of consciousness: awake and alert Pain management: pain level controlled Vital Signs Assessment: post-procedure vital signs reviewed and stable Respiratory status: spontaneous breathing, nonlabored ventilation, respiratory function stable and patient connected to nasal cannula oxygen Cardiovascular status: blood pressure returned to baseline and stable Postop Assessment: no apparent nausea or vomiting Anesthetic complications: no     Last Vitals:  Vitals:   11/03/18 1044 11/03/18 1054  BP: 127/73 101/88  Pulse:    Resp:    Temp:    SpO2:      Last Pain:  Vitals:   11/03/18 1054  TempSrc:   PainSc: 8                  Martha Clan

## 2018-11-03 NOTE — Op Note (Signed)
[facility] Gastroenterology Patient Name: Julia Osborne Procedure Date: 11/03/2018 9:44 AM MRN: [id number] Account #: [id number] Date of Birth: [birth year] Admit Type: Outpatient Age: 58 Room: [facility] ENDO ROOM 4 Gender: Female Note Status: Finalized Procedure:            Colonoscopy Indications:          Lynch Syndrome Providers:            Jonathon Bellows MD, MD Referring MD:         Ancil Linsey MD (Referring MD) Medicines:            Monitored Anesthesia Care Complications:        No immediate complications. Procedure:            Pre-Anesthesia Assessment:                       - Prior to the procedure, a History and Physical was                        performed, and patient medications, allergies and                        sensitivities were reviewed. The patient's tolerance of                        previous anesthesia was reviewed.                       - The risks and benefits of the procedure and the                        sedation options and risks were discussed with the                        patient. All questions were answered and informed                        consent was obtained.                       - ASA Grade Assessment: III - A patient with severe                        systemic disease.                       After obtaining informed consent, the colonoscope was                        passed under direct vision. Throughout the procedure,                        the patient's blood pressure, pulse, and oxygen                        saturations were monitored continuously. The                        Colonoscope was introduced through the anus and                        advanced to  the the ileocolonic anastomosis. The                        colonoscopy was performed with ease. The patient                        tolerated the procedure well. The quality of the bowel                        preparation was good. Findings:      A 5 mm polyp was found  in the rectum. The polyp was sessile. The polyp       was removed with a cold snare. Resection and retrieval were complete.      Non-bleeding internal hemorrhoids were found during retroflexion. The       hemorrhoids were medium-sized and Grade I (internal hemorrhoids that do       not prolapse).      The exam was otherwise without abnormality on direct and retroflexion       views. Impression:           - One 5 mm polyp in the rectum, removed with a cold                        snare. Resected and retrieved.                       - Non-bleeding internal hemorrhoids.                       - The examination was otherwise normal on direct and                        retroflexion views. Recommendation:       - Discharge patient to home (with escort).                       - Resume previous diet.                       - Continue present medications.                       - Await pathology results.                       - Repeat colonoscopy in 2 years for surveillance.                       - Return to my office as previously scheduled. Procedure Code(s):    --- Professional ---                       [phone], Colonoscopy, flexible; with removal of tumor(s),                        polyp(s), or other lesion(s) by snare technique Diagnosis Code(s):    --- Professional ---                       K62.1, Rectal polyp                       K64.0, First degree  hemorrhoids                       Z15.09, Genetic susceptibility to other malignant                        neoplasm CPT copyright 2018 American Medical Association. All rights reserved. The codes documented in this report are preliminary and upon coder review may  be revised to meet current compliance requirements. Jonathon Bellows, MD Jonathon Bellows MD, MD 11/03/2018 10:23:29 AM This report has been signed electronically. Number of Addenda: 0 Note Initiated On: 11/03/2018 9:44 AM Scope Withdrawal Time: 0 hours 10 minutes 46 seconds  Total Procedure  Duration: 0 hours 16 minutes 35 seconds       [facility]

## 2018-11-03 NOTE — Transfer of Care (Signed)
Immediate Anesthesia Transfer of Care Note  Patient: Julia Osborne  Procedure(s) Performed: COLONOSCOPY WITH PROPOFOL (N/A ) ESOPHAGOGASTRODUODENOSCOPY (EGD) WITH PROPOFOL (N/A )  Patient Location: PACU and Endoscopy Unit  Anesthesia Type:General  Level of Consciousness: awake, oriented and patient cooperative  Airway & Oxygen Therapy: Patient Spontanous Breathing  Post-op Assessment: Report given to RN, Post -op Vital signs reviewed and stable and Patient moving all extremities  Post vital signs: Reviewed and stable  Last Vitals:  Vitals Value Taken Time  BP 119/65 11/03/2018 10:25 AM  Temp 36.3 C 11/03/2018 10:24 AM  Pulse 72 11/03/2018 10:27 AM  Resp 13 11/03/2018 10:27 AM  SpO2 99 % 11/03/2018 10:27 AM  Vitals shown include unvalidated device data.  Last Pain:  Vitals:   11/03/18 1024  TempSrc: Tympanic  PainSc: 8          Complications: No apparent anesthesia complications

## 2018-11-03 NOTE — H&P (Signed)
Jonathon Bellows, MD [address] [place], [place], [place], [address] [id number], [place], [place], [place], [address] Phone: [phone]  Fax: [phone]  Primary Care Physician:  Philmore Pali, NP   Pre-Procedure History & Physical: HPI:  Julia Osborne is a 58 y.o. female is here for an endoscopy and colonoscopy    Past Medical History:  Diagnosis Date  . Adenocarcinoma of colon ([facility]) 09/13/2015   Partial colon resection and chemo tx's.   . Anemia   . Anxiety   . Asthma   . Cancer ([facility])    endometrial; cancer cells in intestine  . COPD (chronic obstructive pulmonary disease) ([facility])    no definite diagnosis  . Depression   . Diverticulitis   . Dyspareunia 05/16/2015  . Family history of cancer   . Family history of kidney cancer   . GERD (gastroesophageal reflux disease)   . Glaucoma   . Indigestion   . Lynch syndrome   . Neuropathy    feet and hands  . Uterine fibroid   . Vaginal dryness 05/16/2015  . Vaginal itching 07/16/2015  . Vaginal Pap smear, abnormal     Past Surgical History:  Procedure Laterality Date  . ABDOMINAL HYSTERECTOMY    . APPENDECTOMY    . BIOPSY N/A 03/14/2015   Procedure: BIOPSY;  Surgeon: Daneil Dolin, MD;  Location: [facility];  Service: Endoscopy;  Laterality: N/A;  Gastric  . COLONOSCOPY N/A 01/28/2017   Procedure: COLONOSCOPY;  Surgeon: Daneil Dolin, MD;  Location: [facility];  Service: Endoscopy;  Laterality: N/A;  11:30am  . COLONOSCOPY WITH PROPOFOL N/A 03/14/2015   RMR: Internal hemorrhoids. colonic diverticulosis. Incomplete examination. Prepartation inadequate.  . COLONOSCOPY WITH PROPOFOL N/A 07/04/2015   RMR: Colonic diverticulosis . Large polypoid lesion in the vicinity of the hepatic flexure status post saline-assisted piecmeal snare polypectomy  with ablation and tattooing as described. Sigmoid polyp removed as described above. sigmoid colon polyp hyperplastic, hepatic flexure polyp with TA with focal high grade  dysplasia   . ESOPHAGEAL DILATION N/A 03/14/2015   Procedure: ESOPHAGEAL DILATION;  Surgeon: Daneil Dolin, MD;  Location: [facility];  Service: Endoscopy;  Laterality: N/A;  Maloney 24  . ESOPHAGOGASTRODUODENOSCOPY N/A 05/19/2016   Procedure: ESOPHAGOGASTRODUODENOSCOPY (EGD);  Surgeon: Daneil Dolin, MD;  [facility];  Service: Endoscopy;  Laterality: N/A;  215  . ESOPHAGOGASTRODUODENOSCOPY N/A 01/28/2017   Procedure: ESOPHAGOGASTRODUODENOSCOPY (EGD);  Surgeon: Daneil Dolin, MD;  [facility];  Service: Endoscopy;  Laterality: N/A;  . ESOPHAGOGASTRODUODENOSCOPY (EGD) WITH PROPOFOL N/A 03/14/2015   RMR: Mild erosive reflux esophagitis status post passage o f a Maloney dilator. Abnormal gastric mucosa of uncertain significance as described above. status post biopsy, benign  . ESOPHAGOGASTRODUODENOSCOPY (EGD) WITH PROPOFOL N/A 11/18/2017   Procedure: ESOPHAGOGASTRODUODENOSCOPY (EGD) WITH PROPOFOL;  Surgeon: Daneil Dolin, MD;  [facility];  Service: Endoscopy;  Laterality: N/A;  12:15pm  . MALONEY DILATION N/A 05/19/2016   Procedure: [name] DILATION;  Surgeon: Daneil Dolin, MD;  [facility];  Service: Endoscopy;  Laterality: N/A;  . [name] DILATION N/A 01/28/2017   Procedure: [name] DILATION;  Surgeon: Daneil Dolin, MD;  [facility];  Service: Endoscopy;  Laterality: N/A;  [name] DILATION N/A 11/18/2017   Procedure: [name] DILATION;  Surgeon: Daneil Dolin, MD;  [facility];  Service: Endoscopy;  Laterality: N/A;  . PARTIAL COLECTOMY  08/30/2015  polyp with adenocarcinoma  . POLYPECTOMY N/A 07/04/2015   Procedure: POLYPECTOMY;  Surgeon: Daneil Dolin, MD;  Location: [facility];  Service: Endoscopy;  Laterality: N/A;  . PORTACATH PLACEMENT Right 09/2014  . TUBAL LIGATION      Prior to Admission medications   Medication Sig Start Date End Date Taking? Authorizing Provider  albuterol (PROVENTIL HFA;VENTOLIN HFA) 108 (90  Base) MCG/ACT inhaler Inhale 2 puffs into the lungs every 2 (two) hours as needed for wheezing or shortness of breath (cough). 10/17/15  Yes Annitta Needs, NP  ALPRAZolam Duanne Moron) 0.25 MG tablet Take 0.125 mg by mouth daily as needed for anxiety.    Yes [provider]  citalopram (CELEXA) 40 MG tablet Take 40 mg by mouth at bedtime. 01/05/17  Yes [provider]  DEXILANT 60 MG capsule TAKE 1 CAPSULE BY MOUTH EVERY DAY 01/10/18  Yes Annitta Needs, NP  LINZESS 145 MCG CAPS capsule TAKE 1 CAPSULE (145 MCG TOTAL) BY MOUTH DAILY BEFORE BREAKFAST. 12/23/17  Yes Mahala Menghini, PA-C  loratadine (CLARITIN) 10 MG tablet Take 10 mg by mouth daily as needed for allergies.    Yes [provider]  ondansetron (ZOFRAN) 8 MG tablet Take 8 mg by mouth daily as needed (CAR SICKNESS).   Yes [provider]  sucralfate (CARAFATE) 1 GM/10ML suspension Take 10 mLs (1 g total) by mouth 4 (four) times daily. 10/24/18  Yes Jonathon Bellows, MD  traMADol (ULTRAM) 50 MG tablet Take 1 tablet (50 mg total) by mouth daily as needed. 10/17/18  Yes Verlon Au, NP  traZODone (DESYREL) 50 MG tablet Take 50 mg by mouth at bedtime.   Yes [provider]  albuterol (PROVENTIL) (2.5 MG/3ML) 0.083% nebulizer solution Take 3 mLs (2.5 mg total) by nebulization every 6 (six) hours as needed for wheezing or shortness of breath. 01/16/16   Penland, Kelby Fam, MD  buPROPion (WELLBUTRIN SR) 150 MG 12 hr tablet Take 1 tablet by mouth daily. 07/25/18   [provider]  diazepam (VALIUM) 5 MG tablet Take 1 tablet (5 mg total) by mouth every 8 (eight) hours as needed for muscle spasms. Patient not taking: Reported on 10/24/2018 09/04/17   Earleen Newport, MD  LUMIGAN 0.01 % SOLN Place 1 drop into the right eye at bedtime. 06/24/18   [provider]  LYRICA 75 MG capsule TAKE 1 CAPSULE BY MOUTH EVERYDAY AT BEDTIME Patient not taking: Reported on 10/24/2018 10/24/17   Holley Bouche, NP    Naphazoline-Pheniramine (ALLERGY EYE OP) Place 1-2 drops into both eyes 2 (two) times daily as needed (ITCHY, WATERY EYES).    [provider]  PAZEO 0.7 % SOLN Place 1 drop into both eyes every morning. 06/27/18   [provider]  sodium chloride (OCEAN) 0.65 % SOLN nasal spray Place 1 spray into both nostrils as needed for congestion.    [provider]    Allergies as of 10/25/2018 - Review Complete 10/24/2018  Allergen Reaction Noted  . Codeine Rash 08/25/2011    Family History  Problem Relation Age of Onset  . Other Mother        clot that went to heart, deceased age 47ss  . Heart attack Father        age 36s, deceased  . Stroke Father   . Cancer Father        "at death determined he was ate up with cancer"  . Hypertension Sister   . Kidney  cancer Sister 91  . Diabetes Brother   . Hypertension Brother   . Endometriosis Daughter   . Heart attack Maternal Grandfather   . Diabetes Sister   . Brain cancer Paternal Aunt   . Cancer Paternal Uncle        NOS  . Cancer Paternal Uncle        NOS  . Cancer Paternal Uncle        NOS  . Colon cancer Neg Hx     Social History   Socioeconomic History  . Marital status: Married    Spouse name: Not on file  . Number of children: 2  . Years of education: Not on file  . Highest education level: Not on file  Occupational History  . Not on file  Social Needs  . Financial resource strain: Not on file  . Food insecurity:    Worry: Not on file    Inability: Not on file  . Transportation needs:    Medical: Not on file    Non-medical: Not on file  Tobacco Use  . Smoking status: Current Every Day Smoker    Packs/day: 2.00    Years: 25.00    Pack years: 50.00    Types: Cigarettes  . Smokeless tobacco: Never Used  . Tobacco comment: Is trying to quit/wean off.  Substance and Sexual Activity  . Alcohol use: No  . Drug use: No  . Sexual activity: Not Currently    Birth control/protection: Surgical     Comment: hyst  Lifestyle  . Physical activity:    Days per week: Not on file    Minutes per session: Not on file  . Stress: Not on file  Relationships  . Social connections:    Talks on phone: Not on file    Gets together: Not on file    Attends religious service: Not on file    Active member of club or organization: Not on file    Attends meetings of clubs or organizations: Not on file    Relationship status: Not on file  . Intimate partner violence:    Fear of current or ex partner: Not on file    Emotionally abused: Not on file    Physically abused: Not on file    Forced sexual activity: Not on file  Other Topics Concern  . Not on file  Social History Narrative   #  [place]ives [place];self; smoking- 1/2 ppd; no alcohol; used to run machines;       FHx-Dad- MI; ? Cancer on autopsy; sisters/aunts- brain; lung cancer x2; oldest sister- kidney cancer      Pt has 90 year old son;daughter 66 years. Brothers- states to have spoken to her family re: importance of them being checked for lynch.     Review of Systems: See HPI, otherwise negative ROS  Physical Exam: BP 138/69   Pulse 65   Temp (!) 97 F (36.1 C) (Tympanic)   Resp 18   Ht 5\' 3"  (1.6 m)   Wt 71.2 kg   SpO2 100%   BMI 27.81 kg/m  General:   Alert,  pleasant and cooperative in NAD Head:  Normocephalic and atraumatic. Neck:  Supple; no masses or thyromegaly. Lungs:  Clear throughout to auscultation, normal respiratory effort.    Heart:  +S1, +S2, Regular rate and rhythm, No edema. Abdomen:  Soft, nontender and nondistended. Normal bowel sounds, without guarding, and without rebound.   Neurologic:  Alert and  oriented x4;  grossly  normal neurologically.  Impression/Plan: Julia Osborne is here for an endoscopy and colonoscopy  to be performed for  evaluation of lynch syndrome    Risks, benefits, limitations, and alternatives regarding endoscopy have been reviewed with the patient.  Questions have been  answered.  All parties agreeable.   Jonathon Bellows, MD  11/03/2018, 9:47 AM

## 2018-11-03 NOTE — Anesthesia Preprocedure Evaluation (Signed)
Anesthesia Evaluation  Patient identified by MRN, date of birth, ID band Patient awake    Reviewed: Allergy & Precautions, H&P , NPO status , Patient's Chart, lab work & pertinent test results, reviewed documented beta blocker date and time   History of Anesthesia Complications (+) PONV and history of anesthetic complications  Airway Mallampati: I  TM Distance: >3 FB Neck ROM: full    Dental  (+) Dental Advidsory Given, Poor Dentition   Pulmonary shortness of breath and with exertion, asthma , neg sleep apnea, COPD, neg recent URI, Current Smoker,           Cardiovascular Exercise Tolerance: Good negative cardio ROS       Neuro/Psych PSYCHIATRIC DISORDERS Anxiety Depression negative neurological ROS     GI/Hepatic Neg liver ROS, GERD  ,  Endo/Other  negative endocrine ROS  Renal/GU negative Renal ROS  negative genitourinary   Musculoskeletal   Abdominal   Peds  Hematology  (+) Blood dyscrasia, anemia ,   Anesthesia Other Findings Past Medical History: 09/13/2015: Adenocarcinoma of colon ([place])     Comment:  Partial colon resection and chemo tx's.  No date: Anemia No date: Anxiety No date: Asthma No date: Cancer [facility])     Comment:  endometrial; cancer cells in intestine No date: COPD (chronic obstructive pulmonary disease) ([facility])     Comment:  no definite diagnosis No date: Depression No date: Diverticulitis 05/16/2015: Dyspareunia No date: Family history of cancer No date: Family history of kidney cancer No date: GERD (gastroesophageal reflux disease) No date: Glaucoma No date: Indigestion No date: Lynch syndrome No date: Neuropathy     Comment:  feet and hands No date: Uterine fibroid 05/16/2015: Vaginal dryness 07/16/2015: Vaginal itching No date: Vaginal Pap smear, abnormal   Reproductive/Obstetrics negative OB ROS                             Anesthesia  Physical Anesthesia Plan  ASA: III  Anesthesia Plan: General   Post-op Pain Management:    Induction: Intravenous  PONV Risk Score and Plan: 2 and Propofol infusion and TIVA  Airway Management Planned: Natural Airway and Nasal Cannula  Additional Equipment:   Intra-op Plan:   Post-operative Plan:   Informed Consent: I have reviewed the patients History and Physical, chart, labs and discussed the procedure including the risks, benefits and alternatives for the proposed anesthesia with the patient or authorized representative who has indicated his/her understanding and acceptance.     Dental Advisory Given  Plan Discussed with: Anesthesiologist, CRNA and Surgeon  Anesthesia Plan Comments:         Anesthesia Quick Evaluation

## 2018-11-03 NOTE — Op Note (Signed)
[facility] Patient Name: Julia Osborne Procedure Date: 11/03/2018 9:46 AM MRN: [id number] Account #: [id number] Date of Birth: 1961-01-16 Admit Type: Outpatient Age: 58 Room: [facility] ENDO ROOM 4 Gender: Female Note Status: Finalized Procedure:            Upper GI endoscopy Indications:          Hereditary nonpolyposis colorectal cancer (Lynch                        Syndrome) Providers:            Jonathon Bellows MD, MD Referring MD:         Ancil Linsey MD (Referring MD) Medicines:            Monitored Anesthesia Care Complications:        No immediate complications. Procedure:            Pre-Anesthesia Assessment:                       - Prior to the procedure, a History and Physical was                        performed, and patient medications, allergies and                        sensitivities were reviewed. The patient's tolerance of                        previous anesthesia was reviewed.                       - The risks and benefits of the procedure and the                        sedation options and risks were discussed with the                        patient. All questions were answered and informed                        consent was obtained.                       - ASA Grade Assessment: III - A patient with severe                        systemic disease.                       After obtaining informed consent, the endoscope was                        passed under direct vision. Throughout the procedure,                        the patient's blood pressure, pulse, and oxygen                        saturations were monitored continuously. The Endoscope                        was introduced  through the mouth, and advanced to the                        third part of duodenum. The upper GI endoscopy was                        accomplished with ease. The patient tolerated the                        procedure well. Findings:      The esophagus was  normal.      The entire examined stomach was normal. Biopsies were taken with a cold       forceps for histology.      A single 3 mm sessile polyp was found in the duodenal bulb. The polyp       was removed with a cold biopsy forceps. Resection and retrieval were       complete.      The exam was otherwise without abnormality.      The cardia and gastric fundus were normal on retroflexion. Impression:           - Normal esophagus.                       - Normal stomach. Biopsied.                       - A single duodenal polyp. Resected and retrieved.                       - The examination was otherwise normal. Recommendation:       - Await pathology results.                       - Perform a colonoscopy today. Procedure Code(s):    --- Professional ---                       [phone], Esophagogastroduodenoscopy, flexible, transoral;                        with biopsy, single or multiple Diagnosis Code(s):    --- Professional ---                       K31.7, Polyp of stomach and duodenum                       C18.9, Malignant neoplasm of colon, unspecified                       Z15.09, Genetic susceptibility to other malignant                        neoplasm CPT copyright 2018 American Medical Association. All rights reserved. The codes documented in this report are preliminary and upon coder review may  be revised to meet current compliance requirements. Jonathon Bellows, MD Jonathon Bellows MD, MD 11/03/2018 10:01:30 AM This report has been signed electronically. Number of Addenda: 0 Note Initiated On: 11/03/2018 9:46 AM      [facility]

## 2018-11-03 NOTE — Progress Notes (Signed)
Anastomosis reached 1009 during colonoscopy by Dr. Vicente Males

## 2018-11-04 ENCOUNTER — Encounter: Payer: Self-pay | Admitting: Gastroenterology

## 2018-11-04 LAB — SURGICAL PATHOLOGY

## 2018-11-09 DIAGNOSIS — G629 Polyneuropathy, unspecified: Secondary | ICD-10-CM | POA: Diagnosis not present

## 2018-11-09 DIAGNOSIS — Z79899 Other long term (current) drug therapy: Secondary | ICD-10-CM | POA: Diagnosis not present

## 2018-11-09 DIAGNOSIS — F411 Generalized anxiety disorder: Secondary | ICD-10-CM | POA: Diagnosis not present

## 2018-11-09 DIAGNOSIS — J4520 Mild intermittent asthma, uncomplicated: Secondary | ICD-10-CM | POA: Diagnosis not present

## 2018-11-09 DIAGNOSIS — F331 Major depressive disorder, recurrent, moderate: Secondary | ICD-10-CM | POA: Diagnosis not present

## 2018-11-10 ENCOUNTER — Other Ambulatory Visit: Payer: Self-pay | Admitting: Nurse Practitioner

## 2018-11-10 DIAGNOSIS — Z1231 Encounter for screening mammogram for malignant neoplasm of breast: Secondary | ICD-10-CM

## 2018-11-11 NOTE — Addendum Note (Signed)
Addended by: Dorethea Clan on: 11/11/2018 02:21 PM   Modules accepted: Orders

## 2018-11-30 ENCOUNTER — Inpatient Hospital Stay: Payer: Medicare Other | Attending: Internal Medicine

## 2018-11-30 DIAGNOSIS — Z8542 Personal history of malignant neoplasm of other parts of uterus: Secondary | ICD-10-CM | POA: Diagnosis not present

## 2018-11-30 DIAGNOSIS — Z452 Encounter for adjustment and management of vascular access device: Secondary | ICD-10-CM | POA: Insufficient documentation

## 2018-11-30 DIAGNOSIS — Z95828 Presence of other vascular implants and grafts: Secondary | ICD-10-CM

## 2018-11-30 MED ORDER — HEPARIN SOD (PORK) LOCK FLUSH 100 UNIT/ML IV SOLN
500.0000 [IU] | Freq: Once | INTRAVENOUS | Status: AC
  Administered 2018-11-30: 500 [IU] via INTRAVENOUS

## 2018-11-30 MED ORDER — SODIUM CHLORIDE 0.9% FLUSH
10.0000 mL | Freq: Once | INTRAVENOUS | Status: AC
  Administered 2018-11-30: 10 mL via INTRAVENOUS
  Filled 2018-11-30: qty 10

## 2018-12-05 ENCOUNTER — Telehealth: Payer: Self-pay

## 2018-12-05 NOTE — Telephone Encounter (Signed)
Called pt to inform her of biopsy results.  Unable to contact, LVM to return call

## 2018-12-05 NOTE — Telephone Encounter (Signed)
-----   Message from Jonathon Bellows, MD sent at 12/01/2018 10:41 AM EDT ----- Sherald Hess inform biopsies were all benign - mild gastritis, repat colonoscopy +EGD in 2 years place  Please send letter as well   Dr Jonathon Bellows MD,MRCP [facility]) Gastroenterology/Hepatology Pager: [phone]

## 2018-12-28 ENCOUNTER — Ambulatory Visit: Payer: Self-pay | Admitting: Gastroenterology

## 2019-01-02 ENCOUNTER — Telehealth: Payer: Self-pay

## 2019-01-02 ENCOUNTER — Other Ambulatory Visit: Payer: Self-pay | Admitting: Gastroenterology

## 2019-01-02 NOTE — Telephone Encounter (Signed)
Lmom, wiating on a return call. Want to discuss a letter received from [employer] asking pt to change her Lizess to Amitiza 8 or 24 mcg. Waiting on a return call.

## 2019-01-12 NOTE — Telephone Encounter (Signed)
Spoke with pt on 01/12/19. [employer] sent a letter stating Medicaid will no longer cover Linzess. Medicaid will cover Amitiza 8 and 24 mcg. Pt is ok with changing medication since it will be covered under insurance. Please advise if it's ok to change medication.

## 2019-01-13 ENCOUNTER — Other Ambulatory Visit: Payer: Self-pay

## 2019-01-13 MED ORDER — LUBIPROSTONE 8 MCG PO CAPS: 8.0000 ug | ORAL_CAPSULE | Freq: Two times a day (BID) | ORAL | 11 refills | Status: DC

## 2019-01-13 NOTE — Telephone Encounter (Signed)
Communication noted.  Send prescription for Amitiza 8 mcg gelcaps.  Dispense 60 with 11 refills.  Take 1 twice a day.  Make sure you tell patient to take during a meal to avoid nausea.  Was taking Linzess 145.  She may or may not need the higher dose of amitiza.  We will see

## 2019-01-13 NOTE — Telephone Encounter (Signed)
Medication sent to pts pharmacy. Special instructions for medication must be taken with a meal to avoid nausea included in RX.

## 2019-01-19 DIAGNOSIS — Z1283 Encounter for screening for malignant neoplasm of skin: Secondary | ICD-10-CM | POA: Diagnosis not present

## 2019-01-19 DIAGNOSIS — D1800 Hemangioma unspecified site: Secondary | ICD-10-CM | POA: Diagnosis not present

## 2019-01-19 DIAGNOSIS — D2230 Melanocytic nevi of unspecified part of face: Secondary | ICD-10-CM | POA: Diagnosis not present

## 2019-01-19 DIAGNOSIS — L719 Rosacea, unspecified: Secondary | ICD-10-CM | POA: Diagnosis not present

## 2019-01-19 DIAGNOSIS — L578 Other skin changes due to chronic exposure to nonionizing radiation: Secondary | ICD-10-CM | POA: Diagnosis not present

## 2019-01-19 DIAGNOSIS — L812 Freckles: Secondary | ICD-10-CM | POA: Diagnosis not present

## 2019-01-19 DIAGNOSIS — D225 Melanocytic nevi of trunk: Secondary | ICD-10-CM | POA: Diagnosis not present

## 2019-01-19 DIAGNOSIS — L821 Other seborrheic keratosis: Secondary | ICD-10-CM | POA: Diagnosis not present

## 2019-01-19 DIAGNOSIS — D229 Melanocytic nevi, unspecified: Secondary | ICD-10-CM | POA: Diagnosis not present

## 2019-01-19 DIAGNOSIS — H0265 Xanthelasma of left lower eyelid: Secondary | ICD-10-CM | POA: Diagnosis not present

## 2019-01-31 ENCOUNTER — Other Ambulatory Visit: Payer: Self-pay

## 2019-02-01 ENCOUNTER — Inpatient Hospital Stay: Payer: Medicare Other | Attending: Internal Medicine

## 2019-02-01 ENCOUNTER — Other Ambulatory Visit: Payer: Self-pay

## 2019-02-01 DIAGNOSIS — C541 Malignant neoplasm of endometrium: Secondary | ICD-10-CM | POA: Insufficient documentation

## 2019-02-01 DIAGNOSIS — Z452 Encounter for adjustment and management of vascular access device: Secondary | ICD-10-CM | POA: Diagnosis not present

## 2019-02-01 DIAGNOSIS — Z95828 Presence of other vascular implants and grafts: Secondary | ICD-10-CM

## 2019-02-01 MED ORDER — SODIUM CHLORIDE 0.9% FLUSH
10.0000 mL | Freq: Once | INTRAVENOUS | Status: AC
  Administered 2019-02-01: 10 mL via INTRAVENOUS
  Filled 2019-02-01: qty 10

## 2019-02-01 MED ORDER — HEPARIN SOD (PORK) LOCK FLUSH 100 UNIT/ML IV SOLN
500.0000 [IU] | Freq: Once | INTRAVENOUS | Status: AC
  Administered 2019-02-01: 500 [IU] via INTRAVENOUS

## 2019-02-02 ENCOUNTER — Ambulatory Visit: Payer: Medicare Other | Admitting: Gastroenterology

## 2019-02-18 ENCOUNTER — Other Ambulatory Visit: Payer: Self-pay | Admitting: Gastroenterology

## 2019-03-15 ENCOUNTER — Ambulatory Visit: Payer: Medicare Other | Admitting: Gastroenterology

## 2019-03-23 ENCOUNTER — Ambulatory Visit: Payer: Medicare Other | Admitting: Gastroenterology

## 2019-03-23 ENCOUNTER — Encounter: Payer: Self-pay | Admitting: Gastroenterology

## 2019-03-29 ENCOUNTER — Inpatient Hospital Stay: Payer: Medicare Other

## 2019-03-29 ENCOUNTER — Inpatient Hospital Stay: Payer: Medicare Other | Admitting: Internal Medicine

## 2019-04-07 ENCOUNTER — Other Ambulatory Visit: Payer: Self-pay | Admitting: Nurse Practitioner

## 2019-04-07 DIAGNOSIS — C541 Malignant neoplasm of endometrium: Secondary | ICD-10-CM

## 2019-04-07 DIAGNOSIS — C189 Malignant neoplasm of colon, unspecified: Secondary | ICD-10-CM

## 2019-04-12 DIAGNOSIS — J4520 Mild intermittent asthma, uncomplicated: Secondary | ICD-10-CM | POA: Diagnosis not present

## 2019-04-12 DIAGNOSIS — G629 Polyneuropathy, unspecified: Secondary | ICD-10-CM | POA: Diagnosis not present

## 2019-04-12 DIAGNOSIS — Z79899 Other long term (current) drug therapy: Secondary | ICD-10-CM | POA: Diagnosis not present

## 2019-04-12 DIAGNOSIS — F411 Generalized anxiety disorder: Secondary | ICD-10-CM | POA: Diagnosis not present

## 2019-04-12 DIAGNOSIS — F331 Major depressive disorder, recurrent, moderate: Secondary | ICD-10-CM | POA: Diagnosis not present

## 2019-04-19 ENCOUNTER — Other Ambulatory Visit: Payer: Self-pay | Admitting: Nurse Practitioner

## 2019-04-19 DIAGNOSIS — C189 Malignant neoplasm of colon, unspecified: Secondary | ICD-10-CM

## 2019-04-19 DIAGNOSIS — C541 Malignant neoplasm of endometrium: Secondary | ICD-10-CM

## 2019-04-21 ENCOUNTER — Other Ambulatory Visit: Payer: Self-pay

## 2019-04-24 ENCOUNTER — Other Ambulatory Visit: Payer: Self-pay

## 2019-04-24 ENCOUNTER — Inpatient Hospital Stay (HOSPITAL_BASED_OUTPATIENT_CLINIC_OR_DEPARTMENT_OTHER): Payer: Medicare Other | Admitting: Internal Medicine

## 2019-04-24 ENCOUNTER — Inpatient Hospital Stay: Payer: Medicare Other | Attending: Internal Medicine

## 2019-04-24 ENCOUNTER — Other Ambulatory Visit: Payer: Self-pay | Admitting: *Deleted

## 2019-04-24 DIAGNOSIS — C541 Malignant neoplasm of endometrium: Secondary | ICD-10-CM

## 2019-04-24 DIAGNOSIS — Z9049 Acquired absence of other specified parts of digestive tract: Secondary | ICD-10-CM | POA: Diagnosis not present

## 2019-04-24 DIAGNOSIS — M2550 Pain in unspecified joint: Secondary | ICD-10-CM | POA: Insufficient documentation

## 2019-04-24 DIAGNOSIS — J449 Chronic obstructive pulmonary disease, unspecified: Secondary | ICD-10-CM | POA: Diagnosis not present

## 2019-04-24 DIAGNOSIS — Z8719 Personal history of other diseases of the digestive system: Secondary | ICD-10-CM | POA: Insufficient documentation

## 2019-04-24 DIAGNOSIS — R5381 Other malaise: Secondary | ICD-10-CM | POA: Diagnosis not present

## 2019-04-24 DIAGNOSIS — K5900 Constipation, unspecified: Secondary | ICD-10-CM | POA: Diagnosis not present

## 2019-04-24 DIAGNOSIS — Z9221 Personal history of antineoplastic chemotherapy: Secondary | ICD-10-CM | POA: Diagnosis not present

## 2019-04-24 DIAGNOSIS — Z1509 Genetic susceptibility to other malignant neoplasm: Secondary | ICD-10-CM | POA: Insufficient documentation

## 2019-04-24 DIAGNOSIS — H409 Unspecified glaucoma: Secondary | ICD-10-CM | POA: Insufficient documentation

## 2019-04-24 DIAGNOSIS — G629 Polyneuropathy, unspecified: Secondary | ICD-10-CM | POA: Diagnosis not present

## 2019-04-24 DIAGNOSIS — R5383 Other fatigue: Secondary | ICD-10-CM | POA: Insufficient documentation

## 2019-04-24 DIAGNOSIS — F17210 Nicotine dependence, cigarettes, uncomplicated: Secondary | ICD-10-CM | POA: Diagnosis not present

## 2019-04-24 DIAGNOSIS — K219 Gastro-esophageal reflux disease without esophagitis: Secondary | ICD-10-CM | POA: Diagnosis not present

## 2019-04-24 DIAGNOSIS — G8929 Other chronic pain: Secondary | ICD-10-CM | POA: Insufficient documentation

## 2019-04-24 DIAGNOSIS — Z79899 Other long term (current) drug therapy: Secondary | ICD-10-CM | POA: Diagnosis not present

## 2019-04-24 DIAGNOSIS — M549 Dorsalgia, unspecified: Secondary | ICD-10-CM | POA: Diagnosis not present

## 2019-04-24 LAB — COMPREHENSIVE METABOLIC PANEL
ALT: 27 U/L (ref 0–44)
AST: 20 U/L (ref 15–41)
Albumin: 3.7 g/dL (ref 3.5–5.0)
Alkaline Phosphatase: 60 U/L (ref 38–126)
Anion gap: 11 (ref 5–15)
BUN: 10 mg/dL (ref 6–20)
CO2: 24 mmol/L (ref 22–32)
Calcium: 8.5 mg/dL — ABNORMAL LOW (ref 8.9–10.3)
Chloride: 103 mmol/L (ref 98–111)
Creatinine, Ser: 0.79 mg/dL (ref 0.44–1.00)
GFR calc Af Amer: >60 mL/min (ref >60)
GFR calc non Af Amer: >60 mL/min (ref >60)
Glucose, Bld: 100 mg/dL — ABNORMAL HIGH (ref 70–99)
Potassium: 3.6 mmol/L (ref 3.5–5.1)
Sodium: 138 mmol/L (ref 135–145)
Total Bilirubin: 0.4 mg/dL (ref 0.3–1.2)
Total Protein: 6.9 g/dL (ref 6.5–8.1)

## 2019-04-24 LAB — CBC WITH DIFFERENTIAL/PLATELET
Abs Immature Granulocytes: 0.02 10*3/uL (ref 0.00–0.07)
Basophils Absolute: 0.1 10*3/uL (ref 0.0–0.1)
Basophils Relative: 1 %
Eosinophils Absolute: 0.2 10*3/uL (ref 0.0–0.5)
Eosinophils Relative: 3 %
HCT: 38.5 % (ref 36.0–46.0)
Hemoglobin: 12.8 g/dL (ref 12.0–15.0)
Immature Granulocytes: 0 %
Lymphocytes Relative: 33 %
Lymphs Abs: 2.2 10*3/uL (ref 0.7–4.0)
MCH: 29.6 pg (ref 26.0–34.0)
MCHC: 33.2 g/dL (ref 30.0–36.0)
MCV: 88.9 fL (ref 80.0–100.0)
Monocytes Absolute: 0.6 10*3/uL (ref 0.1–1.0)
Monocytes Relative: 10 %
Neutro Abs: 3.5 10*3/uL (ref 1.7–7.7)
Neutrophils Relative %: 53 %
Platelets: 267 10*3/uL (ref 150–400)
RBC: 4.33 MIL/uL (ref 3.87–5.11)
RDW: 13.3 % (ref 11.5–15.5)
WBC: 6.6 10*3/uL (ref 4.0–10.5)
nRBC: 0 % (ref 0.0–0.2)

## 2019-04-24 MED ORDER — HEPARIN SOD (PORK) LOCK FLUSH 100 UNIT/ML IV SOLN
500.0000 [IU] | Freq: Once | INTRAVENOUS | Status: AC
  Administered 2019-04-24: 500 [IU] via INTRAVENOUS
  Filled 2019-04-24: qty 5

## 2019-04-24 MED ORDER — SODIUM CHLORIDE 0.9% FLUSH
10.0000 mL | INTRAVENOUS | Status: DC | PRN
  Administered 2019-04-24: 10:00:00 10 mL via INTRAVENOUS
  Filled 2019-04-24: qty 10

## 2019-04-24 MED ORDER — HEPARIN SOD (PORK) LOCK FLUSH 100 UNIT/ML IV SOLN
500.0000 [IU] | Freq: Once | INTRAVENOUS | Status: AC
  Administered 2019-04-24: 10:00:00 500 [IU] via INTRAVENOUS

## 2019-04-24 NOTE — Assessment & Plan Note (Addendum)
#   Uterine Cancer endometrioid type grade 2; stage IV [lung nodules at presentation 2015/]-December 2019 CT scan abdomen pelvis/chest-negative for any obvious recurrence/positive for constipation.  Stable.  No clinical evidence of recurrence.  Will get a surveillance CT scan in February 2020.  Await evaluation with gynecology oncology in December 2020.  # Lynch syndrome Julia Osborne of high-grade polyp/status post colectomy: Last colonoscopy-Feb 2020.  EGD- Feb 2020-reviewed.  Stable.  Urine cytology pending.  #Active smoker on Wellbutrin-discussed regarding smoking cessation.  #Peripheral neuropathy grade 2-3; stable continue tramadol once a day as needed  # Constipation- on amitizia; improved.   DISPOSITION: # port flush every 2 months # follow up in 6 months/labs- cbc/cmpca-125//urine cytology; CT scan prior-Dr.B

## 2019-04-24 NOTE — Progress Notes (Signed)
[name]  Patient Care Team: Philmore Pali, NP as PCP - General (Nurse Practitioner) Patrici Ranks, MD (Inactive) as PCP - Hematology/Oncology (Hematology and Oncology) Daneil Dolin, MD as Consulting Physician (Gastroenterology)  CHIEF COMPLAINTS/PURPOSE OF CONSULTATION:  Endometrial cancer  #  Oncology History Overview Note  # OCT 2015- Uterine ca- STAGE IV [multiple lung nodules]; Nov 2015S/p radical hysterectomy, BSO, pelvic lymph node dissection/vaginal biopsies/extensive pelvic disease including positive lymph nodes and positive distal vaginal biopsy.]; [[facility]FB]-endometroid G-2; LVI; positive Pelvic LN/vaginal Bx Jan-April 2016- carbo-Taxol x6 cycles Forestine Na; Dr.Penland]  # Oct 2016- colo- high grade dysplasia [Dr.Rourk; Dr.Jenkins s/p right hemi-colectomy]; last colo- may 2018;    # Genetic testing- [MSH6 mutation]/Lynch syndrome; EGD/colo every 2-3 qyears; urin cytology q6M  Histologic grade G2, histologic type endometrioid adenocarcinoma -----------------------------------------------------  DIAGNOSIS: Endometrioid endometrial cancer  STAGE: IV  ;GOALS: Control/palliative  CURRENT/MOST RECENT THERAPY surveillance    Endometrial cancer ([place])  07/12/2014 Imaging   CT C/A/P, pelvic adenopathy, multiple pulmonary nodules concerning for metastatic disease   07/30/2014 Initial Diagnosis   Endometrial cancer   07/31/2014 Pathology Results   endometrioid adenocarcinoma, G2, lymphovascular invasion present, positive pelvic lymph node and vaginal biopsy   07/31/2014 Definitive Surgery   radical hysterectomy, BSO, pelvic lymph node dissection and vaginal biopsies   09/25/2014 Procedure   Port-A-Cath placement in IR   10/18/2014 - 01/10/2015 Chemotherapy   Carboplatin/Taxol. First cycle given at [facility].  S/P 6 cycles total, 5 cycles given at [facility].   07/04/2015 Procedure   Colonoscopy by Dr. Gala Romney   07/04/2015 Pathology Results   Colon,  polyp(s), vicinity of hepatic flexure - TUBULAR ADENOMA WITH FOCAL HIGH GRADE DYSPLASIA.    Procedure   Partial colectomy by Dr. Arnoldo Morale scheduled for 08/30/2015   10/31/2015 Genetic Testing   [facility] SYNDROME MSH6 Mutation   04/27/2016 Imaging   CT CAP- No acute process or evidence of metastatic disease in the chest. Right middle lobe pulmonary nodule is unchanged back to 11/20/2014, favoring a benign etiology   11/10/2016 Imaging   CT CAP- 1. No evidence of metastatic disease in the chest, abdomen or pelvis. 2. No evidence of local tumor recurrence at the ileocolic anastomosis in the right abdomen or in the pelvis. Decreased mild fat stranding at the base of the right mesentery, most consistent with postsurgical scarring. 3. Aortic atherosclerosis.   Adenocarcinoma of colon ([place])  07/04/2015 Pathology Results   Diagnosis 1. Colon, polyp(s), vicinity of hepatic flexure - TUBULAR ADENOMA WITH FOCAL HIGH GRADE DYSPLASIA. - NO INVASIVE CARCINOMA. 2. Colon, polyp(s), sigmoid - HYPERPLASTIC POLYP. - NO DYSPLASIA OR MALIGNANCY.   07/04/2015 Procedure   Colonoscopoy by Dr. Gala Romney- large polypoid colonic lesion.   08/30/2015 Definitive Surgery   Dr. Arnoldo Morale- right segmental colon resection   08/30/2015 Pathology Results   [id number] intramucosal adenocarcinoma of colon, 0.2 cm inding the laminal propria with negative resection margins and 0/17 lymph nodes.      HISTORY OF PRESENTING ILLNESS:  Julia Osborne 58 y.o.  female history of endometrial cancer stage IV/Lynch syndrome-current on surveillance is here for follow-up.  In the interim patient had EGD/colonoscopy-in February 2020-polyp status post resection.  Patient denies abdominal pain denies any nausea vomiting.  Continues to have chronic tingling and numbness.  No blood in stool or blood in urine.  Chronic back pain joint pains.  Unfortunately she continues to smoke.  Review of Systems  Constitutional: Positive for malaise/fatigue.  Negative for chills,  diaphoresis, fever and weight loss.  HENT: Negative for sore throat.   Eyes: Negative for double vision.  Respiratory: Negative for cough, hemoptysis, sputum production, shortness of breath and wheezing.   Cardiovascular: Negative for chest pain, palpitations, orthopnea and leg swelling.  Gastrointestinal: Negative for blood in stool, constipation, diarrhea, heartburn, melena, nausea and vomiting.  Genitourinary: Negative for dysuria, frequency and urgency.  Musculoskeletal: Positive for back pain and joint pain.  Skin: Negative.  Negative for itching and rash.  Neurological: Positive for tingling. Negative for dizziness, focal weakness, weakness and headaches.  Endo/Heme/Allergies: Does not bruise/bleed easily.  Psychiatric/Behavioral: Negative for depression. The patient is not nervous/anxious and does not have insomnia.      MEDICAL HISTORY:  Past Medical History:  Diagnosis Date  . Adenocarcinoma of colon ([place]) 09/13/2015   Partial colon resection and chemo tx's.   . Anemia   . Anxiety   . Asthma   . Cancer ([place])    endometrial; cancer cells in intestine  . COPD (chronic obstructive pulmonary disease) ([place])    no definite diagnosis  . Depression   . Diverticulitis   . Dyspareunia 05/16/2015  . Family history of cancer   . Family history of kidney cancer   . GERD (gastroesophageal reflux disease)   . Glaucoma   . Indigestion   . Lynch syndrome   . Neuropathy    feet and hands  . Uterine fibroid   . Vaginal dryness 05/16/2015  . Vaginal itching 07/16/2015  . Vaginal Pap smear, abnormal     SURGICAL HISTORY: Past Surgical History:  Procedure Laterality Date  . ABDOMINAL HYSTERECTOMY    . APPENDECTOMY    . BIOPSY N/A 03/14/2015   Procedure: BIOPSY;  Surgeon: Daneil Dolin, MD;  Location: [facility] ORS;  Service: Endoscopy;  Laterality: N/A;  Gastric  . COLONOSCOPY N/A 01/28/2017   Procedure: COLONOSCOPY;  Surgeon: Daneil Dolin, MD;  Location: [facility] ENDO  SUITE;  Service: Endoscopy;  Laterality: N/A;  11:30am  . COLONOSCOPY WITH PROPOFOL N/A 03/14/2015   RMR: Internal hemorrhoids. colonic diverticulosis. Incomplete examination. Prepartation inadequate.  . COLONOSCOPY WITH PROPOFOL N/A 07/04/2015   RMR: Colonic diverticulosis . Large polypoid lesion in the vicinity of the hepatic flexure status post saline-assisted piecmeal snare polypectomy  with ablation and tattooing as described. Sigmoid polyp removed as described above. sigmoid colon polyp hyperplastic, hepatic flexure polyp with TA with focal high grade dysplasia   . COLONOSCOPY WITH PROPOFOL N/A 11/03/2018   Procedure: COLONOSCOPY WITH PROPOFOL;  Surgeon: Jonathon Bellows, MD;  Location: [facility] ENDOSCOPY;  Service: Gastroenterology;  Laterality: N/A;  . ESOPHAGEAL DILATION N/A 03/14/2015   Procedure: ESOPHAGEAL DILATION;  Surgeon: Daneil Dolin, MD;  Location: [facility] ORS;  Service: Endoscopy;  Laterality: N/A;  Maloney 70  . ESOPHAGOGASTRODUODENOSCOPY N/A 05/19/2016   Procedure: ESOPHAGOGASTRODUODENOSCOPY (EGD);  Surgeon: Daneil Dolin, MD;  Location[facility];  Service: Endoscopy;  Laterality: N/A;  215  . ESOPHAGOGASTRODUODENOSCOPY N/A 01/28/2017   Procedure: ESOPHAGOGASTRODUODENOSCOPY (EGD);  Surgeon: Daneil Dolin, MD;  [facility];  Service: Endoscopy;  Laterality: N/A;  . ESOPHAGOGASTRODUODENOSCOPY (EGD) WITH PROPOFOL N/A 03/14/2015   RMR: Mild erosive reflux esophagitis status post passage o f a Maloney dilator. Abnormal gastric mucosa of uncertain significance as described above. status post biopsy, benign  . ESOPHAGOGASTRODUODENOSCOPY (EGD) WITH PROPOFOL N/A 11/18/2017   Procedure: ESOPHAGOGASTRODUODENOSCOPY (EGD) WITH PROPOFOL;  Surgeon: Daneil Dolin, MD;  [facility];  Service: Endoscopy;  Laterality: N/A;  12:15pm  . ESOPHAGOGASTRODUODENOSCOPY (EGD) WITH PROPOFOL N/A 11/03/2018   Procedure: ESOPHAGOGASTRODUODENOSCOPY (EGD) WITH PROPOFOL;  Surgeon: Jonathon Bellows, MD;   Location: [facility] ENDOSCOPY;  Service: Gastroenterology;  Laterality: N/A;  . [name] DILATION N/A 05/19/2016   Procedure: [name] DILATION;  Surgeon: Daneil Dolin, MD;  [facility];  Service: Endoscopy;  Laterality: N/A;  . [name] DILATION N/A 01/28/2017   Procedure: [name] DILATION;  Surgeon: Daneil Dolin, MD;  [facility];  Service: Endoscopy;  Laterality: N/A;  [name] DILATION N/A 11/18/2017   Procedure: [name] DILATION;  Surgeon: Daneil Dolin, MD;  [facility];  Service: Endoscopy;  Laterality: N/A;  . PARTIAL COLECTOMY  08/30/2015   polyp with adenocarcinoma  . POLYPECTOMY N/A 07/04/2015   Procedure: POLYPECTOMY;  Surgeon: Daneil Dolin, MD;  Location: [facility];  Service: Endoscopy;  Laterality: N/A;  . PORTACATH PLACEMENT Right 09/2014  . TUBAL LIGATION      SOCIAL HISTORY: Social History   Socioeconomic History  . Marital status: Married    Spouse name: Not on file  . Number of children: 2  . Years of education: Not on file  . Highest education level: Not on file  Occupational History  . Not on file  Social Needs  . Financial resource strain: Not on file  . Food insecurity    Worry: Not on file    Inability: Not on file  . Transportation needs    Medical: Not on file    Non-medical: Not on file  Tobacco Use  . Smoking status: Current Every Day Smoker    Packs/day: 2.00    Years: 25.00    Pack years: 50.00    Types: Cigarettes  . Smokeless tobacco: Never Used  . Tobacco comment: Is trying to quit/wean off.  Substance and Sexual Activity  . Alcohol use: No  . Drug use: No  . Sexual activity: Not Currently    Birth control/protection: Surgical    Comment: hyst  Lifestyle  . Physical activity    Days per week: Not on file    Minutes per session: Not on file  . Stress: Not on file  Relationships  . Social Herbalist on phone: Not on file    Gets together: Not on file    Attends religious service: Not on  file    Active member of club or organization: Not on file    Attends meetings of clubs or organizations: Not on file    Relationship status: Not on file  . Intimate partner violence    Fear of current or ex partner: Not on file    Emotionally abused: Not on file    Physically abused: Not on file    Forced sexual activity: Not on file  Other Topics Concern  . Not on file  Social History Narrative   #  [place]ives in [place];self; smoking- 1/2 ppd; no alcohol; used to run machines;       FHx-Dad- MI; ? Cancer on autopsy; sisters/aunts- brain; lung cancer x2; oldest sister- kidney cancer      Pt has 14 year old son;daughter 34 years. Brothers- states to have spoken to her family re: importance of them being checked for lynch.     FAMILY HISTORY:  Family History  Problem Relation Age of Onset  . Other Mother        clot that went to heart, deceased age 35ss  . Heart attack Father  age 68s, deceased  . Stroke Father   . Cancer Father        "at death determined he was ate up with cancer"  . Hypertension Sister   . Kidney cancer Sister 45  . Diabetes Brother   . Hypertension Brother   . Endometriosis Daughter   . Heart attack Maternal Grandfather   . Diabetes Sister   . Brain cancer Paternal Aunt   . Cancer Paternal Uncle        NOS  . Cancer Paternal Uncle        NOS  . Cancer Paternal Uncle        NOS  . Colon cancer Neg Hx     ALLERGIES:  is allergic to codeine.  MEDICATIONS:  Current Outpatient Medications  Medication Sig Dispense Refill  . albuterol (PROVENTIL HFA;VENTOLIN HFA) 108 (90 Base) MCG/ACT inhaler Inhale 2 puffs into the lungs every 2 (two) hours as needed for wheezing or shortness of breath (cough). 1 Inhaler 3  . albuterol (PROVENTIL) (2.5 MG/3ML) 0.083% nebulizer solution Take 3 mLs (2.5 mg total) by nebulization every 6 (six) hours as needed for wheezing or shortness of breath. 75 mL 12  . ALPRAZolam (XANAX) 0.25 MG tablet Take 0.125 mg by mouth  daily as needed for anxiety.     Marland Kitchen buPROPion (WELLBUTRIN SR) 150 MG 12 hr tablet Take 1 tablet by mouth daily.    . citalopram (CELEXA) 40 MG tablet Take 40 mg by mouth at bedtime.  2  . DEXILANT 60 MG capsule TAKE 1 CAPSULE BY MOUTH EVERY DAY 90 capsule 3  . gabapentin (NEURONTIN) 300 MG capsule Take 300 mg by mouth 2 (two) times daily.    Marland Kitchen loratadine (CLARITIN) 10 MG tablet Take 10 mg by mouth daily as needed for allergies.     Marland Kitchen lubiprostone (AMITIZA) 8 MCG capsule Take 1 capsule (8 mcg total) by mouth 2 (two) times daily with a meal. Take medication with a meal to avoid nausea. 60 capsule 11  . LUMIGAN 0.01 % SOLN Place 1 drop into the right eye at bedtime.    Marland Kitchen LYRICA 75 MG capsule TAKE 1 CAPSULE BY MOUTH EVERYDAY AT BEDTIME 30 capsule 2  . Naphazoline-Pheniramine (ALLERGY EYE OP) Place 1-2 drops into both eyes 2 (two) times daily as needed (ITCHY, WATERY EYES).    . ondansetron (ZOFRAN) 8 MG tablet Take 8 mg by mouth daily as needed (CAR SICKNESS).    Marland Kitchen PAZEO 0.7 % SOLN Place 1 drop into both eyes every morning.    Marland Kitchen SIMBRINZA 1-0.2 % SUSP Apply 1 drop to eye 2 (two) times daily.    . sodium chloride (OCEAN) 0.65 % SOLN nasal spray Place 1 spray into both nostrils as needed for congestion.    . sucralfate (CARAFATE) 1 GM/10ML suspension Take 10 mLs (1 g total) by mouth 4 (four) times daily. 420 mL 1  . traMADol (ULTRAM) 50 MG tablet Take 1 tablet (50 mg total) by mouth daily as needed. 30 tablet 3  . traZODone (DESYREL) 50 MG tablet Take 50 mg by mouth at bedtime.     No current facility-administered medications for this visit.       Marland Kitchen  PHYSICAL EXAMINATION: ECOG PERFORMANCE STATUS: 0 - Asymptomatic  Vitals:   04/24/19 1022  BP: 116/76  Pulse: 66  Resp: 20  Temp: 97.7 F (36.5 C)   Filed Weights   04/24/19 1022  Weight: 158 lb 4.8 oz (71.8 kg)  Physical Exam  Constitutional: She is oriented to person, place, and time and well-developed, well-nourished, and in no  distress.  Patient is alone.  HENT:  Head: Normocephalic and atraumatic.  Mouth/Throat: Oropharynx is clear and moist. No oropharyngeal exudate.  Eyes: Pupils are equal, round, and reactive to light.  Neck: Normal range of motion. Neck supple.  Cardiovascular: Normal rate and regular rhythm.  Pulmonary/Chest: No respiratory distress. She has no wheezes.  Abdominal: Soft. Bowel sounds are normal. She exhibits no distension and no mass. There is no abdominal tenderness. There is no rebound and no guarding.  Musculoskeletal: Normal range of motion.        General: No tenderness or edema.  Neurological: She is alert and oriented to person, place, and time.  Skin: Skin is warm.     Psychiatric: Affect normal.     LABORATORY DATA:  I have reviewed the data as listed Lab Results  Component Value Date   WBC 6.6 04/24/2019   HGB 12.8 04/24/2019   HCT 38.5 04/24/2019   MCV 88.9 04/24/2019   PLT 267 04/24/2019   Recent Labs    09/07/18 0931 04/24/19 0959  NA 140 138  K 3.7 3.6  CL 105 103  CO2 27 24  GLUCOSE 101* 100*  BUN 10 10  CREATININE 0.93 0.79  CALCIUM 9.1 8.5*  GFRNONAA >60 >60  GFRAA >60 >60  PROT 7.1 6.9  ALBUMIN 4.1 3.7  AST 18 20  ALT 20 27  ALKPHOS 54 60  BILITOT 0.3 0.4    RADIOGRAPHIC STUDIES: I have personally reviewed the radiological images as listed and agreed with the findings in the report. No results found.  ASSESSMENT & PLAN:   Endometrial cancer ([place]) # Uterine Cancer endometrioid type grade 2; stage IV [lung nodules at presentation 2015/]-December 2019 CT scan abdomen pelvis/chest-negative for any obvious recurrence/positive for constipation.  Stable.  No clinical evidence of recurrence.  Will get a surveillance CT scan in February 2020.  Await evaluation with gynecology oncology in December 2020.  # Lynch syndrome Ozzie Hoyle of high-grade polyp/status post colectomy: Last colonoscopy-Feb 2020.  EGD- Feb 2020-reviewed.  Stable.  Urine cytology  pending.  #Active smoker on Wellbutrin-discussed regarding smoking cessation.  #Peripheral neuropathy grade 2-3; stable continue tramadol once a day as needed  # Constipation- on amitizia; improved.   DISPOSITION: # port flush every 2 months # follow up in 6 months/labs- cbc/cmpca-125//urine cytology; CT scan prior-Dr.B    All questions were answered. The patient knows to call the clinic with any problems, questions or concerns.     Cammie Sickle, MD 04/24/2019 1:26 PM

## 2019-04-25 ENCOUNTER — Telehealth: Payer: Self-pay | Admitting: *Deleted

## 2019-04-25 LAB — CA 125: Cancer Antigen (CA) 125: 14.8 U/mL (ref 0.0–38.1)

## 2019-04-25 NOTE — Telephone Encounter (Signed)
-----   Message from Cammie Sickle, MD sent at 04/25/2019  1:09 PM EDT ----- Please inform patient that tumor marker is stable/no concerns for recurrence of cancer. Thank you

## 2019-04-25 NOTE — Telephone Encounter (Signed)
Unable to reach patient. Unable to leave vm. Phone just rings.

## 2019-04-26 LAB — CYTOLOGY - NON PAP

## 2019-04-26 NOTE — Progress Notes (Signed)
Spoke with patient 1014 am 04/26/2019. Pt aware of her test results.

## 2019-05-05 ENCOUNTER — Other Ambulatory Visit: Payer: Self-pay | Admitting: Nurse Practitioner

## 2019-05-05 DIAGNOSIS — C189 Malignant neoplasm of colon, unspecified: Secondary | ICD-10-CM

## 2019-05-05 DIAGNOSIS — C541 Malignant neoplasm of endometrium: Secondary | ICD-10-CM

## 2019-05-08 DIAGNOSIS — H401112 Primary open-angle glaucoma, right eye, moderate stage: Secondary | ICD-10-CM | POA: Diagnosis not present

## 2019-05-08 DIAGNOSIS — H40052 Ocular hypertension, left eye: Secondary | ICD-10-CM | POA: Diagnosis not present

## 2019-05-08 DIAGNOSIS — H1045 Other chronic allergic conjunctivitis: Secondary | ICD-10-CM | POA: Diagnosis not present

## 2019-05-09 NOTE — Telephone Encounter (Signed)
Patient called [facility] requesting refill of tramadol.   As mandated by the [place] STOP Act (Strengthen Opioid Misuse Prevention), the [place] Controlled Substance Reporting System ([place]) was reviewed for this patient and PDMP updated in Epic. Per review, refill is appropriate. Per Dr. Rogue Bussing, continuation of opiate therapy is medically appropriate at this time and agrees to provide continual monitoring, including urine/blood drug screens, as indicated. Prescription sent electronically using [employer] secure transmission to requested pharmacy.   Beckey Rutter, DNP, AGNP-C [place] at [facility] [phone] (work cell) [phone] (office)

## 2019-06-07 ENCOUNTER — Other Ambulatory Visit: Payer: Self-pay | Admitting: Nurse Practitioner

## 2019-06-07 DIAGNOSIS — C189 Malignant neoplasm of colon, unspecified: Secondary | ICD-10-CM

## 2019-06-07 DIAGNOSIS — C541 Malignant neoplasm of endometrium: Secondary | ICD-10-CM

## 2019-06-07 NOTE — Telephone Encounter (Signed)
Patient called [facility] requesting refill of tramadol.   As mandated by the [place] STOP Act (Strengthen Opioid Misuse Prevention), the [place] Controlled Substance Reporting System ([place]) was reviewed for this patient.  I have personally consulted with my supervising physician, Dr. Rogue Bussing, who agrees that continuation of opiate therapy is medically appropriate at this time and agrees to provide continual monitoring, including urine/blood drug screens, as indicated. Prescription sent electronically using [employer] secure transmission to requested pharmacy.   [place] Reviewed & PDMP updated in Epic as appropriate.  Beckey Rutter, DNP, AGNP-C [place] at [facility] [phone] (work cell) [phone] (office)

## 2019-06-21 DIAGNOSIS — F411 Generalized anxiety disorder: Secondary | ICD-10-CM | POA: Insufficient documentation

## 2019-06-21 DIAGNOSIS — D649 Anemia, unspecified: Secondary | ICD-10-CM | POA: Insufficient documentation

## 2019-06-21 DIAGNOSIS — J449 Chronic obstructive pulmonary disease, unspecified: Secondary | ICD-10-CM | POA: Insufficient documentation

## 2019-06-21 DIAGNOSIS — Z8542 Personal history of malignant neoplasm of other parts of uterus: Secondary | ICD-10-CM | POA: Insufficient documentation

## 2019-06-21 DIAGNOSIS — F419 Anxiety disorder, unspecified: Secondary | ICD-10-CM | POA: Insufficient documentation

## 2019-06-21 DIAGNOSIS — K5792 Diverticulitis of intestine, part unspecified, without perforation or abscess without bleeding: Secondary | ICD-10-CM | POA: Insufficient documentation

## 2019-06-21 DIAGNOSIS — J45909 Unspecified asthma, uncomplicated: Secondary | ICD-10-CM | POA: Insufficient documentation

## 2019-06-21 DIAGNOSIS — D259 Leiomyoma of uterus, unspecified: Secondary | ICD-10-CM | POA: Insufficient documentation

## 2019-06-22 ENCOUNTER — Encounter (INDEPENDENT_AMBULATORY_CARE_PROVIDER_SITE_OTHER): Payer: Self-pay

## 2019-06-22 ENCOUNTER — Ambulatory Visit (INDEPENDENT_AMBULATORY_CARE_PROVIDER_SITE_OTHER): Payer: Medicare Other | Admitting: Gastroenterology

## 2019-06-22 ENCOUNTER — Other Ambulatory Visit: Payer: Self-pay

## 2019-06-22 ENCOUNTER — Encounter: Payer: Self-pay | Admitting: Gastroenterology

## 2019-06-22 VITALS — BP 129/79 | HR 73 | Temp 98.6°F | Wt 182.0 lb

## 2019-06-22 DIAGNOSIS — Z1509 Genetic susceptibility to other malignant neoplasm: Secondary | ICD-10-CM | POA: Diagnosis not present

## 2019-06-22 DIAGNOSIS — F17200 Nicotine dependence, unspecified, uncomplicated: Secondary | ICD-10-CM

## 2019-06-22 NOTE — Addendum Note (Signed)
Addended by: Ulyess Blossom L on: 06/22/2019 03:40 PM   Modules accepted: Orders

## 2019-06-22 NOTE — Progress Notes (Signed)
Julia Bellows MD, MRCP(U.K) [address]  [place]  [place], [place] [address]  Main: [phone]  Fax: [phone]   Primary Care Physician: Julia Pali, NP  Primary Gastroenterologist:  Dr. Jonathon Osborne  Here to follow-up for dysphagia, constipation, Lynch syndrome  HPI: Julia Osborne is a 58 y.o. female    Summary of history :  History referred and seen in February 2020 for dysphagia and Lynch syndrome.  She has a history of endometrial cancer, colon cancer, GERD.  She follows with Dr. Lynett Osborne in oncology.  She has been previously established with [facility] gastroenterology and last seen in February 2019.  Carries a history of dysphagia, GERD and CA of her colon, status post right hemicolectomy.  History of stage IV uterine cancer.  Colon polyp resected in October 2016 showed an adenoma with focal high-grade dysplasia.   Interval history 10/24/2018-06/22/2019  10/24/18: H pylori - negative  11/03/2018: EGD: Normal duodenal . Gastric bx shows mild chronic gastritis .  Colonoscopy : Hyperplastic rectal polyp.  04/24/2019: normal CBC and CMP.  No new complaints.  Doing well.  Discussed about small bowel capsule study to rule out small bowel tumors.  She has had a family members tested.  No one else is positive for Lynch syndrome. She continues to smoke and we discussed about stop smoking Current Outpatient Medications  Medication Sig Dispense Refill  . albuterol (PROVENTIL HFA;VENTOLIN HFA) 108 (90 Base) MCG/ACT inhaler Inhale 2 puffs into the lungs every 2 (two) hours as needed for wheezing or shortness of breath (cough). 1 Inhaler 3  . albuterol (PROVENTIL) (2.5 MG/3ML) 0.083% nebulizer solution Take 3 mLs (2.5 mg total) by nebulization every 6 (six) hours as needed for wheezing or shortness of breath. 75 mL 12  . ALPRAZolam (XANAX) 0.25 MG tablet Take 0.125 mg by mouth daily as needed for anxiety.     Marland Kitchen buPROPion (WELLBUTRIN SR) 150 MG 12 hr tablet Take 1 tablet by mouth  daily.    . citalopram (CELEXA) 40 MG tablet Take 40 mg by mouth at bedtime.  2  . DEXILANT 60 MG capsule TAKE 1 CAPSULE BY MOUTH EVERY DAY 90 capsule 3  . gabapentin (NEURONTIN) 300 MG capsule Take 300 mg by mouth 2 (two) times daily.    Marland Kitchen loratadine (CLARITIN) 10 MG tablet Take 10 mg by mouth daily as needed for allergies.     Marland Kitchen lubiprostone (AMITIZA) 8 MCG capsule Take 1 capsule (8 mcg total) by mouth 2 (two) times daily with a meal. Take medication with a meal to avoid nausea. 60 capsule 11  . LUMIGAN 0.01 % SOLN Place 1 drop into the right eye at bedtime.    Marland Kitchen LYRICA 75 MG capsule TAKE 1 CAPSULE BY MOUTH EVERYDAY AT BEDTIME 30 capsule 2  . Naphazoline-Pheniramine (ALLERGY EYE OP) Place 1-2 drops into both eyes 2 (two) times daily as needed (ITCHY, WATERY EYES).    . ondansetron (ZOFRAN) 8 MG tablet Take 8 mg by mouth daily as needed (CAR SICKNESS).    Marland Kitchen PAZEO 0.7 % SOLN Place 1 drop into both eyes every morning.    Marland Kitchen SIMBRINZA 1-0.2 % SUSP Apply 1 drop to eye 2 (two) times daily.    . sodium chloride (OCEAN) 0.65 % SOLN nasal spray Place 1 spray into both nostrils as needed for congestion.    . sucralfate (CARAFATE) 1 GM/10ML suspension Take 10 mLs (1 g total) by mouth 4 (four) times daily. 420 mL 1  .  traMADol (ULTRAM) 50 MG tablet TAKE 1 TABLET BY MOUTH DAILY AS NEEDED. 30 tablet 0  . traZODone (DESYREL) 50 MG tablet Take 50 mg by mouth at bedtime.     No current facility-administered medications for this visit.     Allergies as of 06/22/2019 - Review Complete 11/03/2018  Allergen Reaction Noted  . Codeine Rash 08/25/2011    ROS:  General: Negative for anorexia, weight loss, fever, chills, fatigue, weakness. ENT: Negative for hoarseness, difficulty swallowing , nasal congestion. CV: Negative for chest pain, angina, palpitations, dyspnea on exertion, peripheral edema.  Respiratory: Negative for dyspnea at rest, dyspnea on exertion, cough, sputum, wheezing.  GI: See history of  present illness. GU:  Negative for dysuria, hematuria, urinary incontinence, urinary frequency, nocturnal urination.  Endo: Negative for unusual weight change.    Physical Examination:   There were no vitals taken for this visit.  General: Well-nourished, well-developed in no acute distress.  Eyes: No icterus. Conjunctivae pink. Mouth: Oropharyngeal mucosa moist and pink , no lesions erythema or exudate. Lungs: Clear to auscultation bilaterally. Non-labored. Heart: Regular rate and rhythm, no murmurs rubs or gallops.  Abdomen: Bowel sounds are normal, nontender, nondistended, no hepatosplenomegaly or masses, no abdominal bruits or hernia , no rebound or guarding.   Extremities: No lower extremity edema. No clubbing or deformities. Neuro: Alert and oriented x 3.  Grossly intact. Skin: Warm and dry, no jaundice.   Psych: Alert and cooperative, normal mood and affect.   Imaging Studies: No results found.  Assessment and Plan:   Julia Osborne is a 58 y.o. y/o female    for  dysphagia and Lynch syndrome.  Previously managed at an outside GI and she moved and hence wants to establish care and the [place] area.  History of colon cancer with right hemicolectomy, uterine cancer.  Discussed cancer surveillance in the setting of Lynch syndrome.  1.  Stop smoking reiterated the need 2.  EGD every 2 to 3 years screen for gastric cancer: Due in February 2022 3.  NCCN recommends capsule study of the small bowel tumors but other  societies do not 4.. Requires annual skin exam to screen for skin cancer.Will refer to dermatology  5.  Annual urine analysis 6.  Colonoscopy every 2 to 3 years: Next due in February 2022.  Marland Kitchen If EGD is negative will get esophageal manometry.  7.  Capsule study of the small bowel to rule out small bowel tumors.  Risks, benefits, alternatives of Givens capsule discussed with patient to include but not limited to the rare risk of Given's capsule becoming lodged in the  GI tract requiring surgical removal.  The patient agrees with this plan & consent will be obtained.  Dr Julia Bellows  MD,MRCP [facility]) Follow up in 2 months telephone visit

## 2019-06-26 ENCOUNTER — Inpatient Hospital Stay: Payer: Medicare Other

## 2019-06-27 ENCOUNTER — Other Ambulatory Visit: Payer: Self-pay

## 2019-06-28 ENCOUNTER — Inpatient Hospital Stay: Payer: Medicare Other | Attending: Internal Medicine

## 2019-06-28 ENCOUNTER — Other Ambulatory Visit: Payer: Self-pay

## 2019-06-28 DIAGNOSIS — Z9221 Personal history of antineoplastic chemotherapy: Secondary | ICD-10-CM | POA: Insufficient documentation

## 2019-06-28 DIAGNOSIS — Z90710 Acquired absence of both cervix and uterus: Secondary | ICD-10-CM | POA: Insufficient documentation

## 2019-06-28 DIAGNOSIS — C541 Malignant neoplasm of endometrium: Secondary | ICD-10-CM | POA: Diagnosis not present

## 2019-06-28 DIAGNOSIS — Z90722 Acquired absence of ovaries, bilateral: Secondary | ICD-10-CM | POA: Insufficient documentation

## 2019-06-28 DIAGNOSIS — Z95828 Presence of other vascular implants and grafts: Secondary | ICD-10-CM

## 2019-06-28 MED ORDER — SODIUM CHLORIDE 0.9% FLUSH
10.0000 mL | Freq: Once | INTRAVENOUS | Status: AC
  Administered 2019-06-28: 10 mL via INTRAVENOUS
  Filled 2019-06-28: qty 10

## 2019-06-28 MED ORDER — HEPARIN SOD (PORK) LOCK FLUSH 100 UNIT/ML IV SOLN
500.0000 [IU] | Freq: Once | INTRAVENOUS | Status: AC
  Administered 2019-06-28: 500 [IU] via INTRAVENOUS

## 2019-06-30 DIAGNOSIS — Z23 Encounter for immunization: Secondary | ICD-10-CM | POA: Diagnosis not present

## 2019-07-04 DIAGNOSIS — J4520 Mild intermittent asthma, uncomplicated: Secondary | ICD-10-CM | POA: Diagnosis not present

## 2019-07-04 DIAGNOSIS — Z79899 Other long term (current) drug therapy: Secondary | ICD-10-CM | POA: Diagnosis not present

## 2019-07-04 DIAGNOSIS — G629 Polyneuropathy, unspecified: Secondary | ICD-10-CM | POA: Diagnosis not present

## 2019-07-04 DIAGNOSIS — F331 Major depressive disorder, recurrent, moderate: Secondary | ICD-10-CM | POA: Diagnosis not present

## 2019-07-04 DIAGNOSIS — M25551 Pain in right hip: Secondary | ICD-10-CM | POA: Diagnosis not present

## 2019-07-04 DIAGNOSIS — F411 Generalized anxiety disorder: Secondary | ICD-10-CM | POA: Diagnosis not present

## 2019-07-13 DIAGNOSIS — J329 Chronic sinusitis, unspecified: Secondary | ICD-10-CM | POA: Diagnosis not present

## 2019-07-13 DIAGNOSIS — Z20828 Contact with and (suspected) exposure to other viral communicable diseases: Secondary | ICD-10-CM | POA: Diagnosis not present

## 2019-07-14 ENCOUNTER — Ambulatory Visit: Admission: RE | Admit: 2019-07-14 | Payer: Medicare Other | Source: Home / Self Care | Admitting: Gastroenterology

## 2019-07-14 ENCOUNTER — Encounter: Admission: RE | Payer: Self-pay | Source: Home / Self Care

## 2019-07-14 SURGERY — IMAGING PROCEDURE, GI TRACT, INTRALUMINAL, VIA CAPSULE

## 2019-07-18 ENCOUNTER — Other Ambulatory Visit: Payer: Self-pay

## 2019-07-18 DIAGNOSIS — Z1509 Genetic susceptibility to other malignant neoplasm: Secondary | ICD-10-CM

## 2019-07-24 ENCOUNTER — Encounter: Admission: RE | Payer: Self-pay | Source: Home / Self Care

## 2019-07-24 ENCOUNTER — Encounter: Payer: Self-pay | Admitting: Anesthesiology

## 2019-07-24 ENCOUNTER — Ambulatory Visit: Admission: RE | Admit: 2019-07-24 | Payer: Medicare Other | Source: Home / Self Care | Admitting: Gastroenterology

## 2019-07-24 SURGERY — IMAGING PROCEDURE, GI TRACT, INTRALUMINAL, VIA CAPSULE

## 2019-07-24 NOTE — Anesthesia Preprocedure Evaluation (Deleted)
Anesthesia Evaluation  Patient identified by MRN, date of birth, ID band Patient awake    Reviewed: Allergy & Precautions, H&P , NPO status , Patient's Chart, lab work & pertinent test results  Airway        Dental   Pulmonary asthma , COPD, Current Smoker,           Cardiovascular negative cardio ROS       Neuro/Psych PSYCHIATRIC DISORDERS Anxiety Depression negative neurological ROS     GI/Hepatic Neg liver ROS, GERD  Controlled,H/o colon CA, diverticulosis   Endo/Other  negative endocrine ROS  Renal/GU negative Renal ROS   Endometrial CA    Musculoskeletal   Abdominal   Peds  Hematology negative hematology ROS (+)   Anesthesia Other Findings Past Medical History: 09/13/2015: Adenocarcinoma of colon ([place])     Comment:  Partial colon resection and chemo tx's.  No date: Anemia No date: Anxiety No date: Asthma No date: Cancer [facility])     Comment:  endometrial; cancer cells in intestine No date: COPD (chronic obstructive pulmonary disease) ([facility])     Comment:  no definite diagnosis No date: Depression No date: Diverticulitis 05/16/2015: Dyspareunia No date: Family history of cancer No date: Family history of kidney cancer No date: GERD (gastroesophageal reflux disease) No date: Glaucoma No date: Indigestion No date: Lynch syndrome No date: Neuropathy     Comment:  feet and hands No date: Uterine fibroid 05/16/2015: Vaginal dryness 07/16/2015: Vaginal itching No date: Vaginal Pap smear, abnormal  Past Surgical History: No date: ABDOMINAL HYSTERECTOMY No date: APPENDECTOMY 03/14/2015: BIOPSY; N/A     Comment:  Procedure: BIOPSY;  Surgeon: Daneil Dolin, MD;                Location: [facility];  Service: Endoscopy;  Laterality: N/A;               Gastric 01/28/2017: COLONOSCOPY; N/A     Comment:  Procedure: COLONOSCOPY;  Surgeon: Daneil Dolin, MD;                Location: [facility];  Service:  Endoscopy;                Laterality: N/A;  11:30am 03/14/2015: COLONOSCOPY WITH PROPOFOL; N/A     Comment:  RMR: Internal hemorrhoids. colonic diverticulosis.               Incomplete examination. Prepartation inadequate. 07/04/2015: COLONOSCOPY WITH PROPOFOL; N/A     Comment:  RMR: Colonic diverticulosis . Large polypoid lesion in               the vicinity of the hepatic flexure status post               saline-assisted piecmeal snare polypectomy  with ablation              and tattooing as described. Sigmoid polyp removed as               described above. sigmoid colon polyp hyperplastic,               hepatic flexure polyp with TA with focal high grade               dysplasia  11/03/2018: COLONOSCOPY WITH PROPOFOL; N/A     Comment:  Procedure: COLONOSCOPY WITH PROPOFOL;  Surgeon: Jonathon Bellows, MD;  Location: [facility] ENDOSCOPY;  Service:  Gastroenterology;  Laterality: N/A; 03/14/2015: ESOPHAGEAL DILATION; N/A     Comment:  Procedure: ESOPHAGEAL DILATION;  Surgeon: Daneil Dolin, MD;  Location: [facility];  Service: Endoscopy;                Laterality: N/AVenia Minks 56 05/19/2016: ESOPHAGOGASTRODUODENOSCOPY; N/A     Comment:  Procedure: ESOPHAGOGASTRODUODENOSCOPY (EGD);  Surgeon:               Daneil Dolin, MD;  [facility];  Service:               Endoscopy;  Laterality: N/A;  215 01/28/2017: ESOPHAGOGASTRODUODENOSCOPY; N/A     Comment:  Procedure: ESOPHAGOGASTRODUODENOSCOPY (EGD);  Surgeon:               Daneil Dolin, MD;  [facility];  Service:               Endoscopy;  Laterality: N/A; 03/14/2015: ESOPHAGOGASTRODUODENOSCOPY (EGD) WITH PROPOFOL; N/A     Comment:  RMR: Mild erosive reflux esophagitis status post passage              o f a Maloney dilator. Abnormal gastric mucosa of               uncertain significance as described above. status post               biopsy, benign 11/18/2017: ESOPHAGOGASTRODUODENOSCOPY  (EGD) WITH PROPOFOL; N/A     Comment:  Procedure: ESOPHAGOGASTRODUODENOSCOPY (EGD) WITH               PROPOFOL;  Surgeon: Daneil Dolin, MD;  [facility];  Service: Endoscopy;  Laterality: N/A;                12:15pm 11/03/2018: ESOPHAGOGASTRODUODENOSCOPY (EGD) WITH PROPOFOL; N/A     Comment:  Procedure: ESOPHAGOGASTRODUODENOSCOPY (EGD) WITH               PROPOFOL;  Surgeon: Jonathon Bellows, MD;  Location: [facility]               ENDOSCOPY;  Service: Gastroenterology;  Laterality: N/A; 05/19/2016: MALONEY DILATION; N/A     Comment:  Procedure: Keturah Shavers;  Surgeon: Daneil Dolin,               MD;  [facility];  Service: Endoscopy;                Laterality: N/A; 01/28/2017: MALONEY DILATION; N/A     Comment:  Procedure: Keturah Shavers;  Surgeon: Daneil Dolin,               MD;  [facility];  Service: Endoscopy;                Laterality: N/A; 11/18/2017: [name] DILATION; N/A     Comment:  Procedure: Keturah Shavers;  Surgeon: Daneil Dolin,               MD;  [facility];  Service: Endoscopy;                Laterality: N/A; 08/30/2015: PARTIAL COLECTOMY     Comment:  polyp with adenocarcinoma 07/04/2015: POLYPECTOMY; N/A     Comment:  Procedure: POLYPECTOMY;  Surgeon: Daneil Dolin,  MD;                Location: [facility];  Service: Endoscopy;  Laterality: N/A; 09/2014: PORTACATH PLACEMENT; Right No date: TUBAL LIGATION     Reproductive/Obstetrics negative OB ROS                             Anesthesia Physical Anesthesia Plan  ASA: II  Anesthesia Plan: General   Post-op Pain Management:    Induction:   PONV Risk Score and Plan: Propofol infusion and TIVA  Airway Management Planned: Natural Airway and Nasal Cannula  Additional Equipment:   Intra-op Plan:   Post-operative Plan:   Informed Consent: I have reviewed the patients History and Physical, chart, labs and discussed the  procedure including the risks, benefits and alternatives for the proposed anesthesia with the patient or authorized representative who has indicated his/her understanding and acceptance.     Dental Advisory Given  Plan Discussed with: Anesthesiologist, CRNA and Surgeon  Anesthesia Plan Comments:         Anesthesia Quick Evaluation

## 2019-08-22 ENCOUNTER — Ambulatory Visit (INDEPENDENT_AMBULATORY_CARE_PROVIDER_SITE_OTHER): Payer: Medicare Other | Admitting: Gastroenterology

## 2019-08-22 DIAGNOSIS — Z1509 Genetic susceptibility to other malignant neoplasm: Secondary | ICD-10-CM | POA: Diagnosis not present

## 2019-08-22 NOTE — Progress Notes (Signed)
Jonathon Bellows , MD [address]  [place]  [place], [place] [address]  Main: [phone]  Fax: [phone]   Primary Care Physician: Philmore Pali, NP  Virtual Visit via Video Note  I connected with patient on 08/22/19 at  1:00 PM EST by video and verified that I am speaking with the correct person using two identifiers.   I discussed the limitations, risks, security and privacy concerns of performing an evaluation and management service by video  and the availability of in person appointments. I also discussed with the patient that there may be a patient responsible charge related to this service. The patient expressed understanding and agreed to proceed.  Location of Patient: Home Location of Provider: Home Persons involved: Patient and provider only   History of Present Illness:   Follow up: Syndrome HPI: Julia Osborne is a 58 y.o. female  Summary of history :  History referred and seen in February 2020 for dysphagia and Lynch syndrome.  She has a history of endometrial cancer, colon cancer, GERD.  She follows with Dr. Rogue Bussing  in oncology. [facility] in February 2019. Carries a history of dysphagia, GERD and CA of her colon,status post right hemicolectomy. History of stage IV uterine cancer. Colon polyp resected in October 2016 showed an adenoma with focal high-grade dysplasia.  10/24/18: H pylori - negative  11/03/2018: EGD: Normal duodenal . Gastric bx shows mild chronic gastritis .  Colonoscopy : Hyperplastic rectal polyp.  04/24/2019: normal CBC and CMP.  Interval history 06/22/2019-08/22/2019   Initially was to be video visit but unfortunately had poor connection to the server.  Converted to a telephone visit.  She could not go through the capsule study as she had to cancel it due to shortness of breath.  She is seeing her primary care physician for the same. No other complaints.   She has not yet had a skin appointment to have an annual skin exam. Current Outpatient Medications  Medication Sig Dispense Refill  . albuterol (PROVENTIL HFA;VENTOLIN HFA) 108 (90 Base) MCG/ACT inhaler Inhale 2 puffs into the lungs every 2 (two) hours as needed for wheezing or shortness of breath (cough). 1 Inhaler 3  . albuterol (PROVENTIL) (2.5 MG/3ML) 0.083% nebulizer solution Take 3 mLs (2.5 mg total) by nebulization every 6 (six) hours as needed for wheezing or shortness of breath. 75 mL 12  . ALPRAZolam (XANAX) 0.25 MG tablet Take 0.125 mg by mouth daily as needed for anxiety.     Marland Kitchen buPROPion (WELLBUTRIN SR) 150 MG 12 hr tablet Take 1 tablet by mouth daily.    . citalopram (CELEXA) 40 MG tablet Take 40 mg by mouth at bedtime.  2  . DEXILANT 60 MG capsule TAKE 1 CAPSULE BY MOUTH EVERY DAY 90 capsule 3  . gabapentin (NEURONTIN) 300 MG capsule Take 300 mg by mouth 2 (two) times daily.    Marland Kitchen loratadine (CLARITIN) 10 MG tablet Take 10 mg by mouth daily as needed for allergies.     Marland Kitchen lubiprostone (AMITIZA) 8 MCG capsule Take 1 capsule (8 mcg total) by mouth 2 (two) times daily with a meal. Take medication with a meal to avoid nausea. 60 capsule 11  . LUMIGAN 0.01 % SOLN Place 1 drop into the right eye at bedtime.    Marland Kitchen LYRICA 75 MG capsule TAKE 1 CAPSULE BY MOUTH EVERYDAY AT BEDTIME (Patient not taking: Reported on 06/22/2019) 30 capsule 2  .  Naphazoline-Pheniramine (ALLERGY EYE OP) Place 1-2 drops into both eyes 2 (two) times daily as needed (ITCHY, WATERY EYES).    . ondansetron (ZOFRAN) 8 MG tablet Take 8 mg by mouth daily as needed (CAR SICKNESS).    Marland Kitchen PAZEO 0.7 % SOLN Place 1 drop into both eyes every morning.    Marland Kitchen SIMBRINZA 1-0.2 % SUSP Apply 1 drop to eye 2 (two) times daily.    . sodium chloride (OCEAN) 0.65 % SOLN nasal spray Place 1 spray into both nostrils as needed for congestion.    . sucralfate (CARAFATE) 1 GM/10ML suspension Take 10 mLs (1 g total) by mouth 4 (four) times daily. 420  mL 1  . traMADol (ULTRAM) 50 MG tablet TAKE 1 TABLET BY MOUTH DAILY AS NEEDED. 30 tablet 0  . traZODone (DESYREL) 50 MG tablet Take 50 mg by mouth at bedtime.     No current facility-administered medications for this visit.     Allergies as of 08/22/2019 - Review Complete 07/21/2019  Allergen Reaction Noted  . Codeine Rash 08/25/2011    Review of Systems:    All systems reviewed and negative except where noted in HPI.  General Appearance:    Alert, cooperative, no distress, appears stated age  Head:    Normocephalic, without obvious abnormality, atraumatic  Eyes:    PERRL, conjunctiva/corneas clear,  Ears:    Grossly normal hearing    Neurologic:  Grossly normal    Observations/Objective:  Labs: CMP     Component Value Date/Time   NA 138 04/24/2019 0959   K 3.6 04/24/2019 0959   CL 103 04/24/2019 0959   CO2 24 04/24/2019 0959   GLUCOSE 100 (H) 04/24/2019 0959   BUN 10 04/24/2019 0959   CREATININE 0.79 04/24/2019 0959   CALCIUM 8.5 (L) 04/24/2019 0959   PROT 6.9 04/24/2019 0959   ALBUMIN 3.7 04/24/2019 0959   AST 20 04/24/2019 0959   ALT 27 04/24/2019 0959   ALKPHOS 60 04/24/2019 0959   BILITOT 0.4 04/24/2019 0959   GFRNONAA >60 04/24/2019 0959   GFRAA >60 04/24/2019 0959   Lab Results  Component Value Date   WBC 6.6 04/24/2019   HGB 12.8 04/24/2019   HCT 38.5 04/24/2019   MCV 88.9 04/24/2019   PLT 267 04/24/2019    Imaging Studies: No results found.  Assessment and Plan:   Julia Osborne is a 58 y.o. y/o female   for dysphagia and Lynch syndrome.  History of colon cancer with right hemicolectomy, uterine cancer.  Did not go through capsule study of the small bowel as she was short of breath and had to cancel the appointment.  1. Stop smoking 2. EGD every 2 to 3 years screen for gastric cancer: Due in February 2022 3. NCCN recommends capsule study of the small bowel tumors but othersocietiesdo not 4.. Requires annual skin exam to screen for skin  cancer.did not obtain the same after last visit will refer again. 5. Annual urine analysis 6. Colonoscopy every 2 to 3 years: Next due in February 2022.  .  7.  Capsule study of the small bowel to rule out small bowel tumors in 1 to 2 months..  Follow-up in 3 months    I discussed the assessment and treatment plan with the patient. The patient was provided an opportunity to ask questions and all were answered. The patient agreed with the plan and demonstrated an understanding of the instructions.   The patient was advised to call back or  seek an in-person evaluation if the symptoms worsen or if the condition fails to improve as anticipated.  I provided 12 minutes of face-to-face time during this encounter.  Dr Jonathon Bellows MD,MRCP [facility]) Gastroenterology/Hepatology Pager: [phone]   Speech recognition software was used to dictate this note.

## 2019-08-26 NOTE — Progress Notes (Signed)
REVIEWED-NO ADDITIONAL RECOMMENDATIONS. 

## 2019-08-28 ENCOUNTER — Other Ambulatory Visit: Payer: Self-pay

## 2019-08-28 ENCOUNTER — Inpatient Hospital Stay: Payer: Medicare Other | Attending: Internal Medicine

## 2019-08-28 DIAGNOSIS — Z90710 Acquired absence of both cervix and uterus: Secondary | ICD-10-CM | POA: Diagnosis not present

## 2019-08-28 DIAGNOSIS — Z90722 Acquired absence of ovaries, bilateral: Secondary | ICD-10-CM | POA: Insufficient documentation

## 2019-08-28 DIAGNOSIS — Z452 Encounter for adjustment and management of vascular access device: Secondary | ICD-10-CM | POA: Insufficient documentation

## 2019-08-28 DIAGNOSIS — Z95828 Presence of other vascular implants and grafts: Secondary | ICD-10-CM

## 2019-08-28 DIAGNOSIS — C541 Malignant neoplasm of endometrium: Secondary | ICD-10-CM | POA: Diagnosis present

## 2019-08-28 DIAGNOSIS — Z9221 Personal history of antineoplastic chemotherapy: Secondary | ICD-10-CM | POA: Diagnosis not present

## 2019-08-28 MED ORDER — SODIUM CHLORIDE 0.9% FLUSH
10.0000 mL | Freq: Once | INTRAVENOUS | Status: AC
  Administered 2019-08-28: 13:00:00 10 mL via INTRAVENOUS
  Filled 2019-08-28: qty 10

## 2019-08-28 MED ORDER — HEPARIN SOD (PORK) LOCK FLUSH 100 UNIT/ML IV SOLN
500.0000 [IU] | Freq: Once | INTRAVENOUS | Status: AC
  Administered 2019-08-28: 500 [IU] via INTRAVENOUS

## 2019-08-28 MED ORDER — HEPARIN SOD (PORK) LOCK FLUSH 100 UNIT/ML IV SOLN
500.0000 [IU] | Freq: Once | INTRAVENOUS | Status: AC
  Administered 2019-08-28: 13:00:00 500 [IU] via INTRAVENOUS

## 2019-09-05 ENCOUNTER — Telehealth: Payer: Self-pay | Admitting: Nurse Practitioner

## 2019-09-05 ENCOUNTER — Other Ambulatory Visit: Payer: Self-pay

## 2019-09-05 ENCOUNTER — Telehealth: Payer: Self-pay | Admitting: *Deleted

## 2019-09-05 DIAGNOSIS — Z87891 Personal history of nicotine dependence: Secondary | ICD-10-CM

## 2019-09-05 NOTE — Telephone Encounter (Signed)
Patient calls to report that she has symptoms consistent with covid-19 infection and possible exposure by her husband. She does not have a pulse ox and has not been tested. She became symptomatic 2 days ago. She has fever, cough, chills, sore throat. I provided her info for how to contact for appointment for [facility] testing. She has contacted her pcp. We discussed symptoms that would warrant ER. I encouraged her to self-quarantine and notify others. She says she was last in the cancer center on 12/7 for port flush and was asymptomatic at that time. She is unsure when she contracted the virus.  She is currently asymptomatic of uterine cancer. Surveillance imaging scheduled for 10/19/2019. Will reschedule gyn-onc visit for around that time. I provided her with mychart activation information.

## 2019-09-05 NOTE — Telephone Encounter (Signed)
Julia Osborne has been notified that lung cancer screening CT scan is due currently or will be in near future. Confirmed that patient is within the appropriate age range, and asymptomatic, (no signs or symptoms of lung cancer). Patient denies illness that would prevent curative treatment for lung cancer if found. Verified smoking history.  She is a current smoker and have been smoking for 27 years. She currently smokes a half a pack of cigarettes per day. Patient is agreeable for CT scan being scheduled but would need for it to be at least 2 weeks from now due to exposure of someone with COVID 19. She would prefer to be scheduled for her CT scan on a Monday, Wednesday or Friday at any time.

## 2019-09-06 ENCOUNTER — Inpatient Hospital Stay: Payer: Medicare Other | Admitting: Nurse Practitioner

## 2019-09-07 NOTE — Addendum Note (Signed)
Addended by: Lieutenant Diego on: 09/07/2019 10:14 AM   Modules accepted: Orders

## 2019-09-07 NOTE — Telephone Encounter (Signed)
Smoking history, current, 46 pack year

## 2019-09-20 ENCOUNTER — Other Ambulatory Visit: Payer: Self-pay

## 2019-09-20 ENCOUNTER — Ambulatory Visit
Admission: RE | Admit: 2019-09-20 | Discharge: 2019-09-20 | Disposition: A | Discharge status: Home / Self Care | Payer: Medicare Other | Source: Ambulatory Visit | Attending: Oncology | Admitting: Oncology

## 2019-09-20 DIAGNOSIS — Z87891 Personal history of nicotine dependence: Secondary | ICD-10-CM | POA: Diagnosis not present

## 2019-09-25 ENCOUNTER — Encounter: Payer: Self-pay | Admitting: *Deleted

## 2019-10-13 DIAGNOSIS — F411 Generalized anxiety disorder: Secondary | ICD-10-CM | POA: Diagnosis not present

## 2019-10-13 DIAGNOSIS — J4520 Mild intermittent asthma, uncomplicated: Secondary | ICD-10-CM | POA: Diagnosis not present

## 2019-10-13 DIAGNOSIS — G629 Polyneuropathy, unspecified: Secondary | ICD-10-CM | POA: Diagnosis not present

## 2019-10-13 DIAGNOSIS — F331 Major depressive disorder, recurrent, moderate: Secondary | ICD-10-CM | POA: Diagnosis not present

## 2019-10-13 DIAGNOSIS — Z20822 Contact with and (suspected) exposure to covid-19: Secondary | ICD-10-CM | POA: Diagnosis not present

## 2019-10-14 ENCOUNTER — Emergency Department: Payer: Medicare Other

## 2019-10-14 ENCOUNTER — Emergency Department
Admission: EM | Admit: 2019-10-14 | Discharge: 2019-10-14 | Disposition: A | Discharge status: Home / Self Care | Payer: Medicare Other | Attending: Emergency Medicine | Admitting: Emergency Medicine

## 2019-10-14 ENCOUNTER — Other Ambulatory Visit: Payer: Self-pay

## 2019-10-14 DIAGNOSIS — F17210 Nicotine dependence, cigarettes, uncomplicated: Secondary | ICD-10-CM | POA: Insufficient documentation

## 2019-10-14 DIAGNOSIS — R519 Headache, unspecified: Secondary | ICD-10-CM | POA: Insufficient documentation

## 2019-10-14 DIAGNOSIS — Z8542 Personal history of malignant neoplasm of other parts of uterus: Secondary | ICD-10-CM | POA: Insufficient documentation

## 2019-10-14 DIAGNOSIS — R6889 Other general symptoms and signs: Secondary | ICD-10-CM

## 2019-10-14 DIAGNOSIS — R05 Cough: Secondary | ICD-10-CM | POA: Insufficient documentation

## 2019-10-14 DIAGNOSIS — R4701 Aphasia: Secondary | ICD-10-CM | POA: Diagnosis not present

## 2019-10-14 DIAGNOSIS — J449 Chronic obstructive pulmonary disease, unspecified: Secondary | ICD-10-CM | POA: Insufficient documentation

## 2019-10-14 DIAGNOSIS — R001 Bradycardia, unspecified: Secondary | ICD-10-CM | POA: Diagnosis not present

## 2019-10-14 DIAGNOSIS — Z20822 Contact with and (suspected) exposure to covid-19: Secondary | ICD-10-CM | POA: Diagnosis not present

## 2019-10-14 DIAGNOSIS — R110 Nausea: Secondary | ICD-10-CM | POA: Diagnosis not present

## 2019-10-14 DIAGNOSIS — B349 Viral infection, unspecified: Secondary | ICD-10-CM

## 2019-10-14 DIAGNOSIS — M7918 Myalgia, other site: Secondary | ICD-10-CM | POA: Diagnosis present

## 2019-10-14 DIAGNOSIS — J111 Influenza due to unidentified influenza virus with other respiratory manifestations: Secondary | ICD-10-CM | POA: Diagnosis not present

## 2019-10-14 LAB — CBC
HCT: 34.7 % — ABNORMAL LOW (ref 36.0–46.0)
Hemoglobin: 11.7 g/dL — ABNORMAL LOW (ref 12.0–15.0)
MCH: 30.3 pg (ref 26.0–34.0)
MCHC: 33.7 g/dL (ref 30.0–36.0)
MCV: 89.9 fL (ref 80.0–100.0)
Platelets: 235 10*3/uL (ref 150–400)
RBC: 3.86 MIL/uL — ABNORMAL LOW (ref 3.87–5.11)
RDW: 13.3 % (ref 11.5–15.5)
WBC: 11.7 10*3/uL — ABNORMAL HIGH (ref 4.0–10.5)
nRBC: 0 % (ref 0.0–0.2)

## 2019-10-14 LAB — COMPREHENSIVE METABOLIC PANEL
ALT: 19 U/L (ref 0–44)
AST: 15 U/L (ref 15–41)
Albumin: 3.3 g/dL — ABNORMAL LOW (ref 3.5–5.0)
Alkaline Phosphatase: 51 U/L (ref 38–126)
Anion gap: 6 (ref 5–15)
BUN: 14 mg/dL (ref 6–20)
CO2: 27 mmol/L (ref 22–32)
Calcium: 8.6 mg/dL — ABNORMAL LOW (ref 8.9–10.3)
Chloride: 103 mmol/L (ref 98–111)
Creatinine, Ser: 0.79 mg/dL (ref 0.44–1.00)
GFR calc Af Amer: >60 mL/min (ref >60)
GFR calc non Af Amer: >60 mL/min (ref >60)
Glucose, Bld: 121 mg/dL — ABNORMAL HIGH (ref 70–99)
Potassium: 3.5 mmol/L (ref 3.5–5.1)
Sodium: 136 mmol/L (ref 135–145)
Total Bilirubin: 0.4 mg/dL (ref 0.3–1.2)
Total Protein: 6.1 g/dL — ABNORMAL LOW (ref 6.5–8.1)

## 2019-10-14 LAB — TROPONIN I (HIGH SENSITIVITY): Troponin I (High Sensitivity): 3 ng/L (ref <18)

## 2019-10-14 LAB — POC SARS CORONAVIRUS 2 AG: SARS Coronavirus 2 Ag: NEGATIVE

## 2019-10-14 MED ORDER — ONDANSETRON HCL 4 MG/2ML IJ SOLN
4.0000 mg | Freq: Once | INTRAMUSCULAR | Status: AC
  Administered 2019-10-14: 4 mg via INTRAVENOUS
  Filled 2019-10-14: qty 2

## 2019-10-14 MED ORDER — DIPHENHYDRAMINE HCL 50 MG/ML IJ SOLN
25.0000 mg | Freq: Once | INTRAMUSCULAR | Status: AC
  Administered 2019-10-14: 25 mg via INTRAVENOUS
  Filled 2019-10-14: qty 1

## 2019-10-14 MED ORDER — KETOROLAC TROMETHAMINE 30 MG/ML IJ SOLN
30.0000 mg | Freq: Once | INTRAMUSCULAR | Status: AC
  Administered 2019-10-14: 30 mg via INTRAVENOUS
  Filled 2019-10-14: qty 1

## 2019-10-14 NOTE — ED Notes (Signed)
Pt to CT

## 2019-10-14 NOTE — ED Notes (Signed)
Pt states she has contact with her brother several days ago and then found out he was covid +, pt states she has had a horrible headache for 2 days and has lost her sense of taste.

## 2019-10-14 NOTE — ED Provider Notes (Signed)
[facility] Emergency Department Provider Note  ____________________________________________  Time seen: Approximately 9:32 PM  I have reviewed the triage vital signs and the nursing notes.   HISTORY  Chief Complaint Generalized Body Aches    HPI Julia Osborne is a 59 y.o. female that presents to the emergency department for evaluation of chills, headache, nasal congestion, nonproductive cough, body aches for 2 days.  Patient states that she thought it started out as a sinus infection but now it feels like she has a "bad case of the flu." Her whole body hurts. Headache is the worst symptom.  Patient has not had an appetite and feels nauseous.  She is drinking plenty of fluids.  Patient has a pending Covid test from her MD today.  She smokes 1/2 ppd for 27 years. No SOB, vomiting, abdominal pain, diarrhea.  Past Medical History:  Diagnosis Date  . Adenocarcinoma of colon ([place]) 09/13/2015   Partial colon resection and chemo tx's.   . Anemia   . Anxiety   . Asthma   . Cancer ([place])    endometrial; cancer cells in intestine  . COPD (chronic obstructive pulmonary disease) ([place])    no definite diagnosis  . Depression   . Diverticulitis   . Dyspareunia 05/16/2015  . Family history of cancer   . Family history of kidney cancer   . GERD (gastroesophageal reflux disease)   . Glaucoma   . Indigestion   . Lynch syndrome   . Neuropathy    feet and hands  . Uterine fibroid   . Vaginal dryness 05/16/2015  . Vaginal itching 07/16/2015  . Vaginal Pap smear, abnormal     Patient Active Problem List   Diagnosis Date Noted  . History of endometrial cancer 06/21/2019  . Diverticulitis 06/21/2019  . COPD (chronic obstructive pulmonary disease) ([place]) 06/21/2019  . Asthma 06/21/2019  . Uterine fibroid 06/21/2019  . Anxiety disorder, unspecified 06/21/2019  . Anemia 06/21/2019  . Abdominal pain 12/21/2016  . Lynch syndrome 10/31/2015  . Genetic testing 10/21/2015   . Abdominal pain, left upper quadrant 10/17/2015  . Family history of cancer   . Family history of kidney cancer   . Adenocarcinoma of colon ([place]) 09/13/2015  . Colon neoplasm 08/30/2015  . Vaginal itching 07/16/2015  . History of colonic polyps   . 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 drug ([place]) 06/28/2015  . Constipation 06/05/2015  . Abnormal CT scan, sigmoid colon 06/05/2015  . Vaginal dryness 05/16/2015  . Dyspareunia 05/16/2015  . Mucosal abnormality of stomach   . Reflux esophagitis   . Dysphagia   . Hematochezia   . Diverticulosis of colon without hemorrhage   . Rectal bleeding 02/14/2015  . GERD (gastroesophageal reflux disease) 02/14/2015  . Esophageal dysphagia 02/14/2015  . Abdominal pain, epigastric 02/14/2015  . Nausea with vomiting 02/14/2015  . Endometrial cancer ([place]) 10/09/2014  . Conversion reaction   . Depression   . Aphasia 09/11/2014    Past Surgical History:  Procedure Laterality Date  . ABDOMINAL HYSTERECTOMY    . APPENDECTOMY    . BIOPSY N/A 03/14/2015   Procedure: BIOPSY;  Surgeon: Daneil Dolin, MD;  Location: [facility] ORS;  Service: Endoscopy;  Laterality: N/A;  Gastric  . COLONOSCOPY N/A 01/28/2017   Procedure: COLONOSCOPY;  Surgeon: Daneil Dolin, MD;  Location: [facility] ENDO SUITE;  Service: Endoscopy;  Laterality: N/A;  11:30am  . COLONOSCOPY WITH PROPOFOL N/A 03/14/2015   RMR: Internal hemorrhoids. colonic diverticulosis. Incomplete examination. Prepartation inadequate.  Marland Kitchen  COLONOSCOPY WITH PROPOFOL N/A 07/04/2015   RMR: Colonic diverticulosis . Large polypoid lesion in the vicinity of the hepatic flexure status post saline-assisted piecmeal snare polypectomy  with ablation and tattooing as described. Sigmoid polyp removed as described above. sigmoid colon polyp hyperplastic, hepatic flexure polyp with TA with focal high grade dysplasia   . COLONOSCOPY WITH PROPOFOL N/A 11/03/2018   Procedure: COLONOSCOPY WITH PROPOFOL;  Surgeon: Jonathon Bellows, MD;  Location:  [facility] ENDOSCOPY;  Service: Gastroenterology;  Laterality: N/A;  . ESOPHAGEAL DILATION N/A 03/14/2015   Procedure: ESOPHAGEAL DILATION;  Surgeon: Daneil Dolin, MD;  Location: [facility] ORS;  Service: Endoscopy;  Laterality: N/A;  [place] 29  . ESOPHAGOGASTRODUODENOSCOPY N/A 05/19/2016   Procedure: ESOPHAGOGASTRODUODENOSCOPY (EGD);  Surgeon: Daneil Dolin, MD;  Location: [facility];  Service: Endoscopy;  Laterality: N/A;  215  . ESOPHAGOGASTRODUODENOSCOPY N/A 01/28/2017   Procedure: ESOPHAGOGASTRODUODENOSCOPY (EGD);  Surgeon: Daneil Dolin, MD;  [facility];  Service: Endoscopy;  Laterality: N/A;  . ESOPHAGOGASTRODUODENOSCOPY (EGD) WITH PROPOFOL N/A 03/14/2015   RMR: Mild erosive reflux esophagitis status post passage o f a [place] dilator. Abnormal gastric mucosa of uncertain significance as described above. status post biopsy, benign  . ESOPHAGOGASTRODUODENOSCOPY (EGD) WITH PROPOFOL N/A 11/18/2017   Procedure: ESOPHAGOGASTRODUODENOSCOPY (EGD) WITH PROPOFOL;  Surgeon: Daneil Dolin, MD;  [facility];  Service: Endoscopy;  Laterality: N/A;  12:15pm  . ESOPHAGOGASTRODUODENOSCOPY (EGD) WITH PROPOFOL N/A 11/03/2018   Procedure: ESOPHAGOGASTRODUODENOSCOPY (EGD) WITH PROPOFOL;  Surgeon: Jonathon Bellows, MD;  Location: [facility] ENDOSCOPY;  Service: Gastroenterology;  Laterality: N/A;  . [name] DILATION N/A 05/19/2016   Procedure: [name] DILATION;  Surgeon: Daneil Dolin, MD;  [facility];  Service: Endoscopy;  Laterality: N/A;  . [name] DILATION N/A 01/28/2017   Procedure: [name] DILATION;  Surgeon: Daneil Dolin, MD;  [facility];  Service: Endoscopy;  Laterality: N/A;  [name] DILATION N/A 11/18/2017   Procedure: [name] DILATION;  Surgeon: Daneil Dolin, MD;  [facility];  Service: Endoscopy;  Laterality: N/A;  . PARTIAL COLECTOMY  08/30/2015   polyp with adenocarcinoma  . POLYPECTOMY N/A 07/04/2015   Procedure: POLYPECTOMY;  Surgeon: Daneil Dolin, MD;  Location: [facility];  Service: Endoscopy;  Laterality: N/A;  . PORTACATH PLACEMENT Right 09/2014  . TUBAL LIGATION      Prior to Admission medications   Medication Sig Start Date End Date Taking? Authorizing Provider  albuterol (PROVENTIL HFA;VENTOLIN HFA) 108 (90 Base) MCG/ACT inhaler Inhale 2 puffs into the lungs every 2 (two) hours as needed for wheezing or shortness of breath (cough). 10/17/15   Annitta Needs, NP  albuterol (PROVENTIL) (2.5 MG/3ML) 0.083% nebulizer solution Take 3 mLs (2.5 mg total) by nebulization every 6 (six) hours as needed for wheezing or shortness of breath. 01/16/16   Penland, Kelby Fam, MD  ALPRAZolam Duanne Moron) 0.25 MG tablet Take 0.125 mg by mouth daily as needed for anxiety.     [provider]  buPROPion (WELLBUTRIN SR) 150 MG 12 hr tablet Take 1 tablet by mouth daily. 07/25/18   [provider]  citalopram (CELEXA) 40 MG tablet Take 40 mg by mouth at bedtime. 01/05/17   [provider]  DEXILANT 60 MG capsule TAKE 1 CAPSULE BY MOUTH EVERY DAY 02/24/19   Carlis Stable, NP  gabapentin (NEURONTIN) 300 MG capsule Take 300 mg by mouth 2 (two) times daily. 03/10/19   [provider]  loratadine (CLARITIN) 10 MG  tablet Take 10 mg by mouth daily as needed for allergies.     [provider]  lubiprostone (AMITIZA) 8 MCG capsule Take 1 capsule (8 mcg total) by mouth 2 (two) times daily with a meal. Take medication with a meal to avoid nausea. 01/13/19   Rourk, Cristopher Estimable, MD  LUMIGAN 0.01 % SOLN Place 1 drop into the right eye at bedtime. 06/24/18   [provider]  LYRICA 75 MG capsule TAKE 1 CAPSULE BY MOUTH EVERYDAY AT BEDTIME Patient not taking: Reported on 06/22/2019 10/24/17   Holley Bouche, NP  Naphazoline-Pheniramine (ALLERGY EYE OP) Place 1-2 drops into both eyes 2 (two) times daily as needed (ITCHY, WATERY EYES).    [provider]  ondansetron (ZOFRAN) 8 MG tablet Take 8 mg by mouth daily as needed (CAR  SICKNESS).    [provider]  PAZEO 0.7 % SOLN Place 1 drop into both eyes every morning. 06/27/18   [provider]  SIMBRINZA 1-0.2 % SUSP Apply 1 drop to eye 2 (two) times daily. 04/04/19   [provider]  sodium chloride (OCEAN) 0.65 % SOLN nasal spray Place 1 spray into both nostrils as needed for congestion.    [provider]  sucralfate (CARAFATE) 1 GM/10ML suspension Take 10 mLs (1 g total) by mouth 4 (four) times daily. 10/24/18   Jonathon Bellows, MD  traMADol (ULTRAM) 50 MG tablet TAKE 1 TABLET BY MOUTH DAILY AS NEEDED. 06/07/19   Verlon Au, NP  traZODone (DESYREL) 50 MG tablet Take 50 mg by mouth at bedtime.    [provider]    Allergies Codeine  Family History  Problem Relation Age of Onset  . Other Mother        clot that went to heart, deceased age 55ss  . Heart attack Father        age 72s, deceased  . Stroke Father   . Cancer Father        "at death determined he was ate up with cancer"  . Hypertension Sister   . Kidney cancer Sister 33  . Diabetes Brother   . Hypertension Brother   . Endometriosis Daughter   . Heart attack Maternal Grandfather   . Diabetes Sister   . Brain cancer Paternal Aunt   . Cancer Paternal Uncle        NOS  . Cancer Paternal Uncle        NOS  . Cancer Paternal Uncle        NOS  . Colon cancer Neg Hx     Social History Social History   Tobacco Use  . Smoking status: Current Every Day Smoker    Packs/day: 2.00    Years: 25.00    Pack years: 50.00    Types: Cigarettes  . Smokeless tobacco: Never Used  . Tobacco comment: Is trying to quit/wean off.  Substance Use Topics  . Alcohol use: No  . Drug use: No     Review of Systems  Constitutional: Positive for chills Eyes: No visual changes. No discharge. ENT: Positive for congestion and rhinorrhea. Cardiovascular: No chest pain. Respiratory: Positive for cough. No SOB. Gastrointestinal: No abdominal pain.  No nausea, no  vomiting.  No diarrhea.  No constipation. Musculoskeletal: Positive for body aches. Skin: Negative for rash, abrasions, lacerations, ecchymosis. Neurological: Positive for headache.   ____________________________________________   PHYSICAL EXAM:  VITAL SIGNS: ED Triage Vitals  Enc Vitals Group     BP 10/14/19 2043 Marland Kitchen)  141/89     Pulse Rate 10/14/19 2043 62     Resp 10/14/19 2043 14     Temp 10/14/19 2043 98.4 F (36.9 C)     Temp Source 10/14/19 2043 Oral     SpO2 10/14/19 2043 100 %     Weight 10/14/19 2044 166 lb (75.3 kg)     Height 10/14/19 2044 5\' 3"  (1.6 m)     Head Circumference --      Peak Flow --      Pain Score 10/14/19 2044 10     Pain Loc --      Pain Edu? --      Excl. in [place]? --      Constitutional: Alert and oriented. Well appearing and in no acute distress. Eyes: Conjunctivae are normal. PERRL. EOMI. No discharge. Head: Atraumatic. ENT: No frontal and maxillary sinus tenderness.      Ears: Tympanic membranes pearly gray with good landmarks. No discharge.      Nose: Mild congestion/rhinnorhea.      Mouth/Throat: Mucous membranes are moist. Oropharynx non-erythematous. Tonsils not enlarged. No exudates. Uvula midline. Neck: No stridor.   Hematological/Lymphatic/Immunilogical: No cervical lymphadenopathy. Cardiovascular: Normal rate, regular rhythm.  Good peripheral circulation. Respiratory: Normal respiratory effort without tachypnea or retractions. Lungs CTAB. Good air entry to the bases with no decreased or absent breath sounds. Gastrointestinal: Bowel sounds 4 quadrants. Soft and nontender to palpation. No guarding or rigidity. No palpable masses. No distention. Musculoskeletal: Full range of motion to all extremities. No gross deformities appreciated. Neurologic:  Normal speech and language. No gross focal neurologic deficits are appreciated.  Skin:  Skin is warm, dry and intact. No rash noted. Psychiatric: Mood and affect are normal. Speech and  behavior are normal. Patient exhibits appropriate insight and judgement.   ____________________________________________   LABS (all labs ordered are listed, but only abnormal results are displayed)  Labs Reviewed  CBC - Abnormal; Notable for the following components:      Result Value   WBC 11.7 (*)    RBC 3.86 (*)    Hemoglobin 11.7 (*)    HCT 34.7 (*)    All other components within normal limits  COMPREHENSIVE METABOLIC PANEL - Abnormal; Notable for the following components:   Glucose, Bld 121 (*)    Calcium 8.6 (*)    Total Protein 6.1 (*)    Albumin 3.3 (*)    All other components within normal limits  SARS CORONAVIRUS 2 (TAT 6-24 HRS)  POC SARS CORONAVIRUS 2 AG -  ED  POC SARS CORONAVIRUS 2 AG  TROPONIN I (HIGH SENSITIVITY)  TROPONIN I (HIGH SENSITIVITY)   ____________________________________________  EKG   ____________________________________________  RADIOLOGY Robinette Haines, personally viewed and evaluated these images (plain radiographs) as part of my medical decision making, as well as reviewing the written report by the radiologist.  CT Head Wo Contrast  Result Date: 10/14/2019 CLINICAL DATA:  Headache, normal neuro exam EXAM: CT HEAD WITHOUT CONTRAST TECHNIQUE: Contiguous axial images were obtained from the base of the skull through the vertex without intravenous contrast. COMPARISON:  CT head 09/15/2014 FINDINGS: Brain: No evidence of acute infarction, hemorrhage, hydrocephalus, extra-axial collection or mass lesion/mass effect. Slightly low positioning of the cerebellar tonsils positioned only 2 mm below the foramen magnum, does not meet diagnostic criteria for Chiari malformation. Vascular: No hyperdense vessel or unexpected calcification. Skull: No calvarial fracture or suspicious osseous lesion. No scalp swelling or hematoma. Sinuses/Orbits: Scattered mucosal thickening in the ethmoids.  Paranasal sinuses are otherwise predominantly clear. Mastoid air cells  are well aerated. Middle ear cavities are clear. Included orbital structures are unremarkable. Other: None IMPRESSION: 1. No acute intracranial pathology. 2. Slightly low positioning of the cerebellar tonsils positioned only 2 mm below the foramen magnum, does not meet diagnostic criteria for Chiari malformation. Electronically Signed   By: Lovena Le M.D.   On: 10/14/2019 23:00   DG Chest Portable 1 View  Result Date: 10/14/2019 CLINICAL DATA:  COVID symptoms. Body aches. Cough. Headache and nausea. Chills. EXAM: PORTABLE CHEST 1 VIEW COMPARISON:  Chest CT 09/20/2019, radiograph 09/27/2017 FINDINGS: Right chest port with tip in the SVC. Normal heart size and mediastinal contours. Focal airspace disease. Mild chronic interstitial coarsening. No pulmonary edema, pleural effusion, or pneumothorax. No acute osseous abnormalities are seen. IMPRESSION: No acute findings or evidence of pneumonia. Electronically Signed   By: Keith Rake M.D.   On: 10/14/2019 21:51    ____________________________________________    PROCEDURES  Procedure(s) performed:    Procedures    Medications  ondansetron [facility]) injection 4 mg (4 mg Intravenous Given 10/14/19 2155)  diphenhydrAMINE (BENADRYL) injection 25 mg (25 mg Intravenous Given 10/14/19 2157)  ketorolac (TORADOL) 30 MG/ML injection 30 mg (30 mg Intravenous Given 10/14/19 2317)     ____________________________________________   INITIAL IMPRESSION / ASSESSMENT AND PLAN / ED COURSE  Pertinent labs & imaging results that were available during my care of the patient were reviewed by me and considered in my medical decision making (see chart for details).  Review of the [place] CSRS was performed in accordance of the [place] prior to dispensing any controlled drugs.   Patient's diagnosis is consistent with viral illness and suspected Covid 19 infection. Vital signs and exam are reassuring.  Vital signs and exam are reassuring.  X-ray negative for acute  cardiopulmonary processes.  Patient has a slight leukocytosis of 11.7.  Troponin is negative.  EKG shows normal sinus rhythm.  Covid PCR is pending. Patient was given IV Toradol, Benadryl, Zofran for her headache and body aches with improvement of symptoms.  Patient appears well and is staying well hydrated. Patient feels comfortable going home. Patient will be discharged home with prescriptions for zofran, ibuprofen, flonase. Patient is to follow up with PCP as needed or otherwise directed. Patient is given ED precautions to return to the ED for any worsening or new symptoms.   GENEICE SCHANK was evaluated in Emergency Department on 10/14/2019 for the symptoms described in the history of present illness. She was evaluated in the context of the global COVID-19 pandemic, which necessitated consideration that the patient might be at risk for infection with the SARS-CoV-2 virus that causes COVID-19. Institutional protocols and algorithms that pertain to the evaluation of patients at risk for COVID-19 are in a state of rapid change based on information released by regulatory bodies including the CDC and federal and state organizations. These policies and algorithms were followed during the patient's care in the ED.  ____________________________________________  FINAL CLINICAL IMPRESSION(S) / ED DIAGNOSES  Final diagnoses:  Viral illness  Flu-like symptoms      NEW MEDICATIONS STARTED DURING THIS VISIT:  ED Discharge Orders    None          This chart was dictated using voice recognition software/Dragon. Despite best efforts to proofread, errors can occur which can change the meaning. Any change was purely unintentional.    Laban Emperor, PA-C 10/14/19 2355    Carrie Mew, MD 10/20/19  0000  

## 2019-10-14 NOTE — ED Triage Notes (Signed)
Pt states body aches for two days. Pt with dry cough noted. Pt also complains of headache and nausea and "chills". Pt has a covid test pending at md office. Pt appears in no acute distress.

## 2019-10-15 LAB — SARS CORONAVIRUS 2 (TAT 6-24 HRS): SARS Coronavirus 2: NEGATIVE

## 2019-10-18 DIAGNOSIS — Z79899 Other long term (current) drug therapy: Secondary | ICD-10-CM | POA: Diagnosis not present

## 2019-10-18 DIAGNOSIS — E785 Hyperlipidemia, unspecified: Secondary | ICD-10-CM | POA: Diagnosis not present

## 2019-10-19 ENCOUNTER — Ambulatory Visit: Admission: RE | Admit: 2019-10-19 | Payer: Medicare Other | Source: Ambulatory Visit

## 2019-10-20 ENCOUNTER — Other Ambulatory Visit: Payer: Self-pay

## 2019-10-20 ENCOUNTER — Ambulatory Visit
Admission: RE | Admit: 2019-10-20 | Discharge: 2019-10-20 | Disposition: A | Discharge status: Home / Self Care | Payer: Medicare Other | Source: Ambulatory Visit | Attending: Internal Medicine | Admitting: Internal Medicine

## 2019-10-20 DIAGNOSIS — C541 Malignant neoplasm of endometrium: Secondary | ICD-10-CM | POA: Diagnosis not present

## 2019-10-20 MED ORDER — IOHEXOL 300 MG/ML  SOLN
100.0000 mL | Freq: Once | INTRAMUSCULAR | Status: AC | PRN
  Administered 2019-10-20: 16:00:00 100 mL via INTRAVENOUS

## 2019-10-25 ENCOUNTER — Other Ambulatory Visit: Payer: Self-pay

## 2019-10-25 ENCOUNTER — Inpatient Hospital Stay (HOSPITAL_BASED_OUTPATIENT_CLINIC_OR_DEPARTMENT_OTHER): Payer: Medicare Other | Admitting: Obstetrics and Gynecology

## 2019-10-25 ENCOUNTER — Inpatient Hospital Stay (HOSPITAL_BASED_OUTPATIENT_CLINIC_OR_DEPARTMENT_OTHER): Payer: Medicare Other | Admitting: Internal Medicine

## 2019-10-25 ENCOUNTER — Inpatient Hospital Stay: Payer: Medicare Other | Attending: Internal Medicine

## 2019-10-25 VITALS — BP 140/82 | HR 67 | Temp 98.3°F | Resp 18 | Wt 165.9 lb

## 2019-10-25 DIAGNOSIS — J449 Chronic obstructive pulmonary disease, unspecified: Secondary | ICD-10-CM | POA: Insufficient documentation

## 2019-10-25 DIAGNOSIS — K219 Gastro-esophageal reflux disease without esophagitis: Secondary | ICD-10-CM | POA: Diagnosis not present

## 2019-10-25 DIAGNOSIS — F419 Anxiety disorder, unspecified: Secondary | ICD-10-CM | POA: Diagnosis not present

## 2019-10-25 DIAGNOSIS — R1310 Dysphagia, unspecified: Secondary | ICD-10-CM | POA: Diagnosis not present

## 2019-10-25 DIAGNOSIS — C541 Malignant neoplasm of endometrium: Secondary | ICD-10-CM | POA: Diagnosis not present

## 2019-10-25 DIAGNOSIS — Z90710 Acquired absence of both cervix and uterus: Secondary | ICD-10-CM | POA: Insufficient documentation

## 2019-10-25 DIAGNOSIS — Z90722 Acquired absence of ovaries, bilateral: Secondary | ICD-10-CM | POA: Diagnosis not present

## 2019-10-25 DIAGNOSIS — F329 Major depressive disorder, single episode, unspecified: Secondary | ICD-10-CM | POA: Insufficient documentation

## 2019-10-25 DIAGNOSIS — Z95828 Presence of other vascular implants and grafts: Secondary | ICD-10-CM

## 2019-10-25 DIAGNOSIS — F17210 Nicotine dependence, cigarettes, uncomplicated: Secondary | ICD-10-CM | POA: Diagnosis not present

## 2019-10-25 DIAGNOSIS — K5900 Constipation, unspecified: Secondary | ICD-10-CM | POA: Diagnosis not present

## 2019-10-25 DIAGNOSIS — N952 Postmenopausal atrophic vaginitis: Secondary | ICD-10-CM

## 2019-10-25 LAB — CBC WITH DIFFERENTIAL/PLATELET
Abs Immature Granulocytes: 0.02 10*3/uL (ref 0.00–0.07)
Basophils Absolute: 0.1 10*3/uL (ref 0.0–0.1)
Basophils Relative: 1 %
Eosinophils Absolute: 0.2 10*3/uL (ref 0.0–0.5)
Eosinophils Relative: 2 %
HCT: 39.1 % (ref 36.0–46.0)
Hemoglobin: 12.6 g/dL (ref 12.0–15.0)
Immature Granulocytes: 0 %
Lymphocytes Relative: 28 %
Lymphs Abs: 2.3 10*3/uL (ref 0.7–4.0)
MCH: 30 pg (ref 26.0–34.0)
MCHC: 32.2 g/dL (ref 30.0–36.0)
MCV: 93.1 fL (ref 80.0–100.0)
Monocytes Absolute: 0.7 10*3/uL (ref 0.1–1.0)
Monocytes Relative: 9 %
Neutro Abs: 4.8 10*3/uL (ref 1.7–7.7)
Neutrophils Relative %: 60 %
Platelets: 244 10*3/uL (ref 150–400)
RBC: 4.2 MIL/uL (ref 3.87–5.11)
RDW: 13.4 % (ref 11.5–15.5)
WBC: 8.1 10*3/uL (ref 4.0–10.5)
nRBC: 0 % (ref 0.0–0.2)

## 2019-10-25 LAB — COMPREHENSIVE METABOLIC PANEL
ALT: 17 U/L (ref 0–44)
AST: 14 U/L — ABNORMAL LOW (ref 15–41)
Albumin: 3.8 g/dL (ref 3.5–5.0)
Alkaline Phosphatase: 56 U/L (ref 38–126)
Anion gap: 9 (ref 5–15)
BUN: 12 mg/dL (ref 6–20)
CO2: 24 mmol/L (ref 22–32)
Calcium: 8.8 mg/dL — ABNORMAL LOW (ref 8.9–10.3)
Chloride: 104 mmol/L (ref 98–111)
Creatinine, Ser: 0.79 mg/dL (ref 0.44–1.00)
GFR calc Af Amer: >60 mL/min (ref >60)
GFR calc non Af Amer: >60 mL/min (ref >60)
Glucose, Bld: 99 mg/dL (ref 70–99)
Potassium: 3.8 mmol/L (ref 3.5–5.1)
Sodium: 137 mmol/L (ref 135–145)
Total Bilirubin: 0.4 mg/dL (ref 0.3–1.2)
Total Protein: 7 g/dL (ref 6.5–8.1)

## 2019-10-25 MED ORDER — SODIUM CHLORIDE 0.9% FLUSH
10.0000 mL | Freq: Once | INTRAVENOUS | Status: AC
  Administered 2019-10-25: 10:00:00 10 mL via INTRAVENOUS
  Filled 2019-10-25: qty 10

## 2019-10-25 MED ORDER — HEPARIN SOD (PORK) LOCK FLUSH 100 UNIT/ML IV SOLN
500.0000 [IU] | Freq: Once | INTRAVENOUS | Status: AC
  Administered 2019-10-25: 500 [IU] via INTRAVENOUS
  Filled 2019-10-25: qty 5

## 2019-10-25 MED ORDER — HEPARIN SOD (PORK) LOCK FLUSH 100 UNIT/ML IV SOLN
500.0000 [IU] | Freq: Once | INTRAVENOUS | Status: AC
  Administered 2019-10-25: 10:00:00 500 [IU] via INTRAVENOUS
  Filled 2019-10-25: qty 5

## 2019-10-25 MED ORDER — SODIUM CHLORIDE 0.9% FLUSH
10.0000 mL | Freq: Once | INTRAVENOUS | Status: AC
  Administered 2019-10-25: 10 mL via INTRAVENOUS
  Filled 2019-10-25: qty 10

## 2019-10-25 NOTE — Progress Notes (Signed)
Gynecologic Oncology Interval Visit   Referring Provider:  Dr. Rogue Bussing  Chief Concern: Endometrial cancer  Subjective:  Julia Osborne is a 59 y.o. female who is seen in consultation from Dr. Rogue Bussing for endometrial cancer surveillance for stage IV disease.  CT C/A/P 10/20/19 1. No evidence of metastatic disease in the chest, abdomen or pelvis. 2. Signs of right hemicolectomy and hysterectomy with bilateral salpingo-oophorectomy.  She has no vaginal bleeding, but does complain of vaginal dryness and irritation. Replens has helped in the past but she can not afford this medication. Dr. Vicente Males manages all her Lynch cancer screening per her report. Her two children have been tested for the mutation and they are not carriers.    Gynecologic Oncology History  Julia Osborne is a pleasant female G65P2 with history of stage IV endometrial cancer. Her history is as follows:    She has Lynch Syndrome with MSH6 mutation and developed uterine cancer, endometrioid grade 2; stage IV with lung nodules in 2015.  Had surgery TAH/BSO, nodes and vaginal bx 11/15 with Dr Hessie Dibble at [facility] in [place].  Then got six cycles of carboplatin/taxol at [facility] which she believed were completed by 02/2015.  CT abdomen pelvis April 2018 NED.  She has a history of high-grade polyp/status post colectomy: given the history of Lynch syndrome-patient needs intensive surveillance colonoscopies; last colonoscopy-May 2018.    She sees  Dr Rogue Bussing for surveillance of endometrial cancer; Dr. Vicente Males in [place] NP orders her mammograms.   Problem List: Patient Active Problem List   Diagnosis Date Noted  . History of endometrial cancer 06/21/2019  . Diverticulitis 06/21/2019  . COPD (chronic obstructive pulmonary disease) ([place]) 06/21/2019  . Asthma 06/21/2019  . Uterine fibroid 06/21/2019  . Anxiety disorder, unspecified 06/21/2019  . Anemia 06/21/2019  . Abdominal pain 12/21/2016  . Lynch  syndrome 10/31/2015  . Genetic testing 10/21/2015  . Abdominal pain, left upper quadrant 10/17/2015  . Family history of cancer   . Family history of kidney cancer   . Adenocarcinoma of colon ([place]) 09/13/2015  . Colon neoplasm 08/30/2015  . Vaginal itching 07/16/2015  . History of colonic polyps   . 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 drug ([place]) 06/28/2015  . Constipation 06/05/2015  . Abnormal CT scan, sigmoid colon 06/05/2015  . Vaginal dryness 05/16/2015  . Dyspareunia 05/16/2015  . Mucosal abnormality of stomach   . Reflux esophagitis   . Dysphagia   . Hematochezia   . Diverticulosis of colon without hemorrhage   . Rectal bleeding 02/14/2015  . GERD (gastroesophageal reflux disease) 02/14/2015  . Esophageal dysphagia 02/14/2015  . Abdominal pain, epigastric 02/14/2015  . Nausea with vomiting 02/14/2015  . Endometrial cancer ([place]) 10/09/2014  . Conversion reaction   . Depression   . Aphasia 09/11/2014    Past Medical History: Past Medical History:  Diagnosis Date  . Adenocarcinoma of colon ([place]) 09/13/2015   Partial colon resection and chemo tx's.   . Anemia   . Anxiety   . Asthma   . Cancer ([place])    endometrial; cancer cells in intestine  . COPD (chronic obstructive pulmonary disease) ([place])    no definite diagnosis  . Depression   . Diverticulitis   . Dyspareunia 05/16/2015  . Family history of cancer   . Family history of kidney cancer   . GERD (gastroesophageal reflux disease)   . Glaucoma   . Indigestion   . Lynch syndrome   . Neuropathy  feet and hands  . Uterine fibroid   . Vaginal dryness 05/16/2015  . Vaginal itching 07/16/2015  . Vaginal Pap smear, abnormal     Past Surgical History: Past Surgical History:  Procedure Laterality Date  . ABDOMINAL HYSTERECTOMY    . APPENDECTOMY    . BIOPSY N/A 03/14/2015   Procedure: BIOPSY;  Surgeon: Daneil Dolin, MD;  Location: [facility] OR[facility];  Service: Endoscopy;  Laterality: N/A;  Gastric  . COLONOSCOPY N/A  01/28/2017   Procedure: COLONOSCOPY;  Surgeon: Daneil Dolin, MD;  Location: [facility] END[facility];  Service: Endoscopy;  Laterality: N/A;  11:30am  . COLONOSCOPY WITH PROPOFOL N/A 03/14/2015   RMR: Internal hemorrhoids. colonic diverticulosis. Incomplete examination. Prepartation inadequate.  . COLONOSCOPY WITH PROPOFOL N/A 07/04/2015   RMR: Colonic diverticulosis . Large polypoid lesion in the vicinity of the hepatic flexure status post saline-assisted piecmeal snare polypectomy  with ablation and tattooing as described. Sigmoid polyp removed as described above. sigmoid colon polyp hyperplastic, hepatic flexure polyp with TA with focal high grade dysplasia   . COLONOSCOPY WITH PROPOFOL N/A 11/03/2018   Procedure: COLONOSCOPY WITH PROPOFOL;  Surgeon: Jonathon Bellows, MD;  Location: [facility] ENDOSCOPY;  Service: Gastroenterology;  Laterality: N/A;  . ESOPHAGEAL DILATION N/A 03/14/2015   Procedure: ESOPHAGEAL DILATION;  Surgeon: Daneil Dolin, MD;  Location: [facility];  Service: Endoscopy;  Laterality: N/A;  Maloney 59  . ESOPHAGOGASTRODUODENOSCOPY N/A 05/19/2016   Procedure: ESOPHAGOGASTRODUODENOSCOPY (EGD);  Surgeon: Daneil Dolin, MD;  Location: [facility];  Service: Endoscopy;  Laterality: N/A;  215  . ESOPHAGOGASTRODUODENOSCOPY N/A 01/28/2017   Procedure: ESOPHAGOGASTRODUODENOSCOPY (EGD);  Surgeon: Daneil Dolin, MD;  [facility];  Service: Endoscopy;  Laterality: N/A;  . ESOPHAGOGASTRODUODENOSCOPY (EGD) WITH PROPOFOL N/A 03/14/2015   RMR: Mild erosive reflux esophagitis status post passage o f a [place] dilator. Abnormal gastric mucosa of uncertain significance as described above. status post biopsy, benign  . ESOPHAGOGASTRODUODENOSCOPY (EGD) WITH PROPOFOL N/A 11/18/2017   Procedure: ESOPHAGOGASTRODUODENOSCOPY (EGD) WITH PROPOFOL;  Surgeon: Daneil Dolin, MD;  [facility];  Service: Endoscopy;  Laterality: N/A;  12:15pm  . ESOPHAGOGASTRODUODENOSCOPY (EGD) WITH PROPOFOL N/A 11/03/2018    Procedure: ESOPHAGOGASTRODUODENOSCOPY (EGD) WITH PROPOFOL;  Surgeon: Jonathon Bellows, MD;  Location: [facility] ENDOSCOPY;  Service: Gastroenterology;  Laterality: N/A;  . [name] DILATION N/A 05/19/2016   Procedure: [name] DILATION;  Surgeon: Daneil Dolin, MD;  [facility];  Service: Endoscopy;  Laterality: N/A;  . [name] DILATION N/A 01/28/2017   Procedure: [name] DILATION;  Surgeon: Daneil Dolin, MD;  [facility]: [facility];  Service: Endoscopy;  Laterality: N/A;  [name] DILATION N/A 11/18/2017   Procedure: [name] DILATION;  Surgeon: Daneil Dolin, MD;  [facility]: [facility];  Service: Endoscopy;  Laterality: N/A;  . PARTIAL COLECTOMY  08/30/2015   polyp with adenocarcinoma  . POLYPECTOMY N/A 07/04/2015   Procedure: POLYPECTOMY;  Surgeon: Daneil Dolin, MD;  Location: [facility];  Service: Endoscopy;  Laterality: N/A;  . PORTACATH PLACEMENT Right 09/2014  . TUBAL LIGATION       OB History:  OB History  Gravida Para Term Preterm AB Living  _0 SAB TAB Ectopic Multiple Live Births               # Outcome Date GA Lbr Len/2nd Weight Sex Delivery Anes PTL Lv  2 Para           1  Para             Family History: Family History  Problem Relation Age of Onset  . Other Mother        clot that went to heart, deceased age 28ss  . Heart attack Father        age 54s, deceased  . Stroke Father   . Cancer Father        "at death determined he was ate up with cancer"  . Hypertension Sister   . Kidney cancer Sister 79  . Diabetes Brother   . Hypertension Brother   . Endometriosis Daughter   . Heart attack Maternal Grandfather   . Diabetes Sister   . Brain cancer Paternal Aunt   . Cancer Paternal Uncle        NOS  . Cancer Paternal Uncle        NOS  . Cancer Paternal Uncle        NOS  . Colon cancer Neg Hx     Social History: Social History   Socioeconomic History  . Marital status: Married    Spouse name: Not on file  . Number of children: 2  . Years  of education: Not on file  . Highest education level: Not on file  Occupational History  . Not on file  Tobacco Use  . Smoking status: Current Every Day Smoker    Packs/day: 2.00    Years: 25.00    Pack years: 50.00    Types: Cigarettes  . Smokeless tobacco: Never Used  . Tobacco comment: Is trying to quit/wean off.  Substance and Sexual Activity  . Alcohol use: No  . Drug use: No  . Sexual activity: Not Currently    Birth control/protection: Surgical    Comment: hyst  Other Topics Concern  . Not on file  Social History Narrative   #  [place]ives [place]; smoking- 1/2 ppd; no alcohol; used to run machines;       FHx-Dad- MI; ? Cancer on autopsy; sisters/aunts- brain; lung cancer x2; oldest sister- kidney cancer      Pt has 11 year old son;daughter 70 years. Brothers- states to have spoken to her family re: importance of them being checked for lynch.    Social Determinants of Health   Financial Resource Strain:   . Difficulty of Paying Living Expenses: Not on file  Food Insecurity:   . Worried About Charity fundraiser in the Last Year: Not on file  . Ran Out of Food in the Last Year: Not on file  Transportation Needs:   . Lack of Transportation (Medical): Not on file  . Lack of Transportation (Non-Medical): Not on file  Physical Activity:   . Days of Exercise per Week: Not on file  . Minutes of Exercise per Session: Not on file  Stress:   . Feeling of Stress : Not on file  Social Connections:   . Frequency of Communication with Friends and Family: Not on file  . Frequency of Social Gatherings with Friends and Family: Not on file  . Attends Religious Services: Not on file  . Active Member of Clubs or Organizations: Not on file  . Attends Archivist Meetings: Not on file  . Marital Status: Not on file  Intimate Partner Violence:   . Fear of Current or Ex-Partner: Not on file  . Emotionally Abused: Not on file  . Physically Abused: Not on file  . Sexually  Abused: Not on file  Allergies: Allergies  Allergen Reactions  . Codeine Rash    Current Medications: Current Outpatient Medications  Medication Sig Dispense Refill  . albuterol (PROVENTIL HFA;VENTOLIN HFA) 108 (90 Base) MCG/ACT inhaler Inhale 2 puffs into the lungs every 2 (two) hours as needed for wheezing or shortness of breath (cough). 1 Inhaler 3  . albuterol (PROVENTIL) (2.5 MG/3ML) 0.083% nebulizer solution Take 3 mLs (2.5 mg total) by nebulization every 6 (six) hours as needed for wheezing or shortness of breath. 75 mL 12  . ALPRAZolam (XANAX) 0.25 MG tablet Take 0.125 mg by mouth daily as needed for anxiety.     Marland Kitchen buPROPion (WELLBUTRIN SR) 150 MG 12 hr tablet Take 1 tablet by mouth daily.    . citalopram (CELEXA) 40 MG tablet Take 40 mg by mouth at bedtime.  2  . DEXILANT 60 MG capsule TAKE 1 CAPSULE BY MOUTH EVERY DAY 90 capsule 3  . gabapentin (NEURONTIN) 300 MG capsule Take 300 mg by mouth 2 (two) times daily.    Marland Kitchen loratadine (CLARITIN) 10 MG tablet Take 10 mg by mouth daily as needed for allergies.     Marland Kitchen lubiprostone (AMITIZA) 8 MCG capsule Take 1 capsule (8 mcg total) by mouth 2 (two) times daily with a meal. Take medication with a meal to avoid nausea. (Patient taking differently: Take 8 mcg by mouth 2 (two) times daily with a meal. Take medication with a meal to avoid nausea. Pt taking daily) 60 capsule 11  . LUMIGAN 0.01 % SOLN Place 1 drop into the right eye at bedtime.    Mable Fill (ALLERGY EYE OP) Place 1-2 drops into both eyes 2 (two) times daily as needed (ITCHY, WATERY EYES).    . ondansetron (ZOFRAN) 8 MG tablet Take 8 mg by mouth daily as needed (CAR SICKNESS).    Marland Kitchen PAZEO 0.7 % SOLN Place 1 drop into both eyes every morning.    Marland Kitchen SIMBRINZA 1-0.2 % SUSP Apply 1 drop to eye 2 (two) times daily.    . sodium chloride (OCEAN) 0.65 % SOLN nasal spray Place 1 spray into both nostrils as needed for congestion.    . sucralfate (CARAFATE) 1 GM/10ML  suspension Take 10 mLs (1 g total) by mouth 4 (four) times daily. 420 mL 1  . traMADol (ULTRAM) 50 MG tablet TAKE 1 TABLET BY MOUTH DAILY AS NEEDED. 30 tablet 0  . traZODone (DESYREL) 50 MG tablet Take 50 mg by mouth at bedtime.    Marland Kitchen LYRICA 75 MG capsule TAKE 1 CAPSULE BY MOUTH EVERYDAY AT BEDTIME (Patient not taking: Reported on 06/22/2019) 30 capsule 2   No current facility-administered medications for this visit.    Review of Systems General: Fatigue  HEENT: no complaints  Lungs: no complaints  Cardiac: no complaints  GI: chronic abdominal pain, bloating and constipation - she follows with the GI team and believes her symptoms are due to prior surgery and bowel issues  GU: bladder complaints and vaginal dryness  Musculoskeletal: back pain  Extremities: no complaints  Skin: no complaints  Neuro: peripheral neuropathy  Endocrine: no complaints  Psych: no complaints Allergies: yes     Per her report her symptoms are long-standing in nature and she does not have any new symptoms   Objective:  Physical Examination:  BP 140/82 (BP Location: Left Arm, Patient Position: Sitting)   Pulse 67   Temp 98.3 F (36.8 C) (Tympanic)   Resp 18   Wt 165 lb 14.4 oz (75.3 kg)  BMI 29.39 kg/m    GENERAL: Patient is a well appearing female in no acute distress HEENT:  PERRL, neck supple with midline trachea.  NODES:  No cervical, supraclavicular, axillary, or inguinal lymphadenopathy palpated.  LUNGS:  Clear to auscultation bilaterally.   HEART:  Regular rate and rhythm.  ABDOMEN:  Soft, nontender, nondistended. No ascites, hernia, masses, or hepatosplenomegaly.   EXTREMITIES:  No peripheral edema.   SKIN:  Clear with no obvious rashes or skin changes. No nail dyscrasia. NEURO:  Nonfocal. Well oriented.  Appropriate affect.  Pelvic: EGBUS: no lesions Cervix: surgically absent Vagina: no lesions, no discharge or bleeding, but atrophy present Uterus:surgically absent Adnexa: no  palpable masses Rectovaginal: deferred   Assessment:  Julia Osborne is a 59 y.o. female with Lynch Syndrome and stage IV grade 2 endometrial cancer diagnosed 11/15 s/p TAH/BSO nodes who presented with lung metastases.  NED since 6 cycles of carboplatin/taxol chemotherapy.  MSH6 mutation carrier.   Vaginal/vulvar irritation, with prior response to Replens, suspect atrophy.     She had partial colectomy for high grade polyp in past.   Medical co-morbidities complicating care:  COPD, prior surgeries Plan:   Problem List Items Addressed This Visit      Genitourinary   Endometrial cancer ([place]) - Primary    Other Visit Diagnoses    Vaginal atrophy         She will continue to follow with her other providers. Dr. Vicente Males is managing surveillance for other Lynch associated cancers. We do need to confirm this and ensure she is undergoing recommended screening. NCCN guidelines are becoming more extensive and vary based on mutation type.    We will try to obtain Replens for her. If she has symptoms despite this intervention consider colposcopy of the vulva.   We can see her back for follow up in 6 months or sooner if any symptoms arise.     The patient's diagnosis, an outline of the further diagnostic and laboratory studies which will be required, the recommendation, and alternatives were discussed.  All questions were answered to the patient's satisfaction.  A total of 30 minutes were spent with the patient/family today; >50% was spent in education, counseling and coordination of care for endometrial cancer and Lynch syndrome.   NCCN Guidelines for MSH6 v1.2020       Nekesha Font Gaetana Michaelis, MD   ADDENDUM: Discussed with Dr. Rogue Bussing. He will manage her screening for Lynch syndrome associated other cancers.  Jenniefer Salak Gaetana Michaelis, MD

## 2019-10-25 NOTE — Assessment & Plan Note (Addendum)
#   Uterine Cancer endometrioid type grade 2; stage IV [lung nodules at presentation 2015/]-February 2021- CT scan abdomen pelvis/chest-negative for any obvious recurrence/positive for constipation.  Stable.  No clinical evidence of recurrence.   # Lynch syndrome Ozzie Hoyle of high-grade polyp/status post colectomy: Last colonoscopy-Feb 2020.  EGD- Feb 2020-reviewed.  Stable.  Urine cytology pending.  Discussed with Dr. Theora Gianotti.  #Active smoker on Wellbutrin-discussed smoking cessation.  #Peripheral neuropathy grade 2-3; stable continue tramadol as needed.  # Constipation- on amitizia-stable  DISPOSITION: # port flush every 3 months # follow up in 6 months/labs- cbc/cmp/ca-125//urine cytology--Dr.B  # I reviewed the blood work- with the patient in detail; also reviewed the imaging independently [as summarized above]; and with the patient in detail.

## 2019-10-25 NOTE — Progress Notes (Signed)
Pt in for follow up reports been sick on and off a lot, has had multiple covid tests but states they"ve all been negative.  Most recent 2 weeks ago.

## 2019-10-25 NOTE — Progress Notes (Signed)
[place] NOTE  Patient Care Team: Philmore Pali, NP as PCP - General (Nurse Practitioner) Patrici Ranks, MD (Inactive) as PCP - Hematology/Oncology (Hematology and Oncology) Daneil Dolin, MD as Consulting Physician (Gastroenterology)  CHIEF COMPLAINTS/PURPOSE OF CONSULTATION:  Endometrial cancer  #  Oncology History Overview Note  # OCT 2015- Uterine ca- STAGE IV [multiple lung nodules]; Nov 2015S/p radical hysterectomy, BSO, pelvic lymph node dissection/vaginal biopsies/extensive pelvic disease including positive lymph nodes and positive distal vaginal biopsy.]; [[facility]]-endometroid G-2; LVI; positive Pelvic LN/vaginal Bx Jan-April 2016- carbo-Taxol x6 cycles Forestine Na; Dr.Penland]  # Oct 2016- colo- high grade dysplasia [Dr.Rourk; Dr.Jenkins s/p right hemi-colectomy]; last colo- may 2018;    # Genetic testing- [MSH6 mutation]/Lynch syndrome; EGD/colo every 2-3 qyears; urin cytology q6M  Histologic grade G2, histologic type endometrioid adenocarcinoma -----------------------------------------------------  DIAGNOSIS: Endometrioid endometrial cancer  STAGE: IV  ;GOALS: Control/palliative  CURRENT/MOST RECENT THERAPY surveillance    Endometrial cancer ([place])  07/12/2014 Imaging   CT C/A/P, pelvic adenopathy, multiple pulmonary nodules concerning for metastatic disease   07/30/2014 Initial Diagnosis   Endometrial cancer   07/31/2014 Pathology Results   endometrioid adenocarcinoma, G2, lymphovascular invasion present, positive pelvic lymph node and vaginal biopsy   07/31/2014 Definitive Surgery   radical hysterectomy, BSO, pelvic lymph node dissection and vaginal biopsies   09/25/2014 Procedure   Port-A-Cath placement in IR   10/18/2014 - 01/10/2015 Chemotherapy   Carboplatin/Taxol. First cycle given at [facility].  S/P 6 cycles total, 5 cycles given at [facility].   07/04/2015 Procedure   Colonoscopy by Dr. Gala Romney   07/04/2015 Pathology Results   Colon,  polyp(s), vicinity of hepatic flexure - TUBULAR ADENOMA WITH FOCAL HIGH GRADE DYSPLASIA.    Procedure   Partial colectomy by Dr. Arnoldo Morale scheduled for 08/30/2015   10/31/2015 Genetic Testing   [facility] SYNDROME MSH6 Mutation   04/27/2016 Imaging   CT CAP- No acute process or evidence of metastatic disease in the chest. Right middle lobe pulmonary nodule is unchanged back to 11/20/2014, favoring a benign etiology   11/10/2016 Imaging   CT CAP- 1. No evidence of metastatic disease in the chest, abdomen or pelvis. 2. No evidence of local tumor recurrence at the ileocolic anastomosis in the right abdomen or in the pelvis. Decreased mild fat stranding at the base of the right mesentery, most consistent with postsurgical scarring. 3. Aortic atherosclerosis.   Adenocarcinoma of colon ([place])  07/04/2015 Pathology Results   Diagnosis 1. Colon, polyp(s), vicinity of hepatic flexure - TUBULAR ADENOMA WITH FOCAL HIGH GRADE DYSPLASIA. - NO INVASIVE CARCINOMA. 2. Colon, polyp(s), sigmoid - HYPERPLASTIC POLYP. - NO DYSPLASIA OR MALIGNANCY.   07/04/2015 Procedure   Colonoscopoy by Dr. Gala Romney- large polypoid colonic lesion.   08/30/2015 Definitive Surgery   Dr. Arnoldo Morale- right segmental colon resection   08/30/2015 Pathology Results   [id number] intramucosal adenocarcinoma of colon, 0.2 cm inding the laminal propria with negative resection margins and 0/17 lymph nodes.      HISTORY OF PRESENTING ILLNESS:  Julia Osborne 59 y.o.  female history of endometrial cancer stage IV/Lynch syndrome-current on surveillance is here for follow-up.  Patient denies any unusual shortness of breath or cough.  Appetite is good with no weight loss.  Denies abdominal pain nausea vomiting.  Continues to smoke.  No blood in stools or blood in urine.  Review of Systems  Constitutional: Positive for malaise/fatigue. Negative for chills, diaphoresis, fever and weight loss.  HENT: Negative for sore throat.   Eyes:  Negative for  double vision.  Respiratory: Negative for cough, hemoptysis, sputum production, shortness of breath and wheezing.   Cardiovascular: Negative for chest pain, palpitations, orthopnea and leg swelling.  Gastrointestinal: Negative for blood in stool, constipation, diarrhea, heartburn, melena, nausea and vomiting.  Genitourinary: Negative for dysuria, frequency and urgency.  Musculoskeletal: Positive for back pain and joint pain.  Skin: Negative.  Negative for itching and rash.  Neurological: Positive for tingling. Negative for dizziness, focal weakness, weakness and headaches.  Endo/Heme/Allergies: Does not bruise/bleed easily.  Psychiatric/Behavioral: Negative for depression. The patient is not nervous/anxious and does not have insomnia.      MEDICAL HISTORY:  Past Medical History:  Diagnosis Date  . Adenocarcinoma of colon ([place]) 09/13/2015   Partial colon resection and chemo tx's.   . Anemia   . Anxiety   . Asthma   . Cancer ([place])    endometrial; cancer cells in intestine  . COPD (chronic obstructive pulmonary disease) ([place])    no definite diagnosis  . Depression   . Diverticulitis   . Dyspareunia 05/16/2015  . Family history of cancer   . Family history of kidney cancer   . GERD (gastroesophageal reflux disease)   . Glaucoma   . Indigestion   . Lynch syndrome   . Neuropathy    feet and hands  . Uterine fibroid   . Vaginal dryness 05/16/2015  . Vaginal itching 07/16/2015  . Vaginal Pap smear, abnormal     SURGICAL HISTORY: Past Surgical History:  Procedure Laterality Date  . ABDOMINAL HYSTERECTOMY    . APPENDECTOMY    . BIOPSY N/A 03/14/2015   Procedure: BIOPSY;  Surgeon: Daneil Dolin, MD;  Location: [facility];  Service: Endoscopy;  Laterality: N/A;  Gastric  . COLONOSCOPY N/A 01/28/2017   Procedure: COLONOSCOPY;  Surgeon: Daneil Dolin, MD;  Location: [facility] END[facility] SUITE;  Service: Endoscopy;  Laterality: N/A;  11:30am  . COLONOSCOPY WITH PROPOFOL N/A 03/14/2015   RMR:  Internal hemorrhoids. colonic diverticulosis. Incomplete examination. Prepartation inadequate.  . COLONOSCOPY WITH PROPOFOL N/A 07/04/2015   RMR: Colonic diverticulosis . Large polypoid lesion in the vicinity of the hepatic flexure status post saline-assisted piecmeal snare polypectomy  with ablation and tattooing as described. Sigmoid polyp removed as described above. sigmoid colon polyp hyperplastic, hepatic flexure polyp with TA with focal high grade dysplasia   . COLONOSCOPY WITH PROPOFOL N/A 11/03/2018   Procedure: COLONOSCOPY WITH PROPOFOL;  Surgeon: Jonathon Bellows, MD;  Location: [facility] ENDOSCOPY;  Service: Gastroenterology;  Laterality: N/A;  . ESOPHAGEAL DILATION N/A 03/14/2015   Procedure: ESOPHAGEAL DILATION;  Surgeon: Daneil Dolin, MD;  Location: [facility]S;  Service: Endoscopy;  Laterality: N/A;  [place] 61  . ESOPHAGOGASTRODUODENOSCOPY N/A 05/19/2016   Procedure: ESOPHAGOGASTRODUODENOSCOPY (EGD);  Surgeon: Daneil Dolin, MD;  Location: [facility];  Service: Endoscopy;  Laterality: N/A;  215  . ESOPHAGOGASTRODUODENOSCOPY N/A 01/28/2017   Procedure: ESOPHAGOGASTRODUODENOSCOPY (EGD);  Surgeon: Daneil Dolin, MD;  [facility];  Service: Endoscopy;  Laterality: N/A;  . ESOPHAGOGASTRODUODENOSCOPY (EGD) WITH PROPOFOL N/A 03/14/2015   RMR: Mild erosive reflux esophagitis status post passage o f a Maloney dilator. Abnormal gastric mucosa of uncertain significance as described above. status post biopsy, benign  . ESOPHAGOGASTRODUODENOSCOPY (EGD) WITH PROPOFOL N/A 11/18/2017   Procedure: ESOPHAGOGASTRODUODENOSCOPY (EGD) WITH PROPOFOL;  Surgeon: Daneil Dolin, MD;  [facility];  Service: Endoscopy;  Laterality: N/A;  12:15pm  . ESOPHAGOGASTRODUODENOSCOPY (EGD) WITH PROPOFOL N/A 11/03/2018   Procedure: ESOPHAGOGASTRODUODENOSCOPY (  EGD) WITH PROPOFOL;  Surgeon: Jonathon Bellows, MD;  Location: [facility] ENDOSCOPY;  Service: Gastroenterology;  Laterality: N/A;  . [name] DILATION N/A 05/19/2016    Procedure: [name] DILATION;  Surgeon: Daneil Dolin, MD;  [facility];  Service: Endoscopy;  Laterality: N/A;  . [name] DILATION N/A 01/28/2017   Procedure: [name] DILATION;  Surgeon: Daneil Dolin, MD;  [facility]: [facility];  Service: Endoscopy;  Laterality: N/A;  [name] DILATION N/A 11/18/2017   Procedure: [name] DILATION;  Surgeon: Daneil Dolin, MD;  [facility];  Service: Endoscopy;  Laterality: N/A;  . PARTIAL COLECTOMY  08/30/2015   polyp with adenocarcinoma  . POLYPECTOMY N/A 07/04/2015   Procedure: POLYPECTOMY;  Surgeon: Daneil Dolin, MD;  Location: [facility];  Service: Endoscopy;  Laterality: N/A;  . PORTACATH PLACEMENT Right 09/2014  . TUBAL LIGATION      SOCIAL HISTORY: Social History   Socioeconomic History  . Marital status: Married    Spouse name: Not on file  . Number of children: 2  . Years of education: Not on file  . Highest education level: Not on file  Occupational History  . Not on file  Tobacco Use  . Smoking status: Current Every Day Smoker    Packs/day: 2.00    Years: 25.00    Pack years: 50.00    Types: Cigarettes  . Smokeless tobacco: Never Used  . Tobacco comment: Is trying to quit/wean off.  Substance and Sexual Activity  . Alcohol use: No  . Drug use: No  . Sexual activity: Not Currently    Birth control/protection: Surgical    Comment: hyst  Other Topics Concern  . Not on file  Social History Narrative   #  lives in [place];self; smoking- 1/2 ppd; no alcohol; used to run machines;       FHx-Dad- MI; ? Cancer on autopsy; sisters/aunts- brain; lung cancer x2; oldest sister- kidney cancer      Pt has 90 year old son;daughter 26 years. Brothers- states to have spoken to her family re: importance of them being checked for lynch.    Social Determinants of Health   Financial Resource Strain:   . Difficulty of Paying Living Expenses: Not on file  Food Insecurity:   . Worried About Charity fundraiser in  the Last Year: Not on file  . Ran Out of Food in the Last Year: Not on file  Transportation Needs:   . Lack of Transportation (Medical): Not on file  . Lack of Transportation (Non-Medical): Not on file  Physical Activity:   . Days of Exercise per Week: Not on file  . Minutes of Exercise per Session: Not on file  Stress:   . Feeling of Stress : Not on file  Social Connections:   . Frequency of Communication with Friends and Family: Not on file  . Frequency of Social Gatherings with Friends and Family: Not on file  . Attends Religious Services: Not on file  . Active Member of Clubs or Organizations: Not on file  . Attends Archivist Meetings: Not on file  . Marital Status: Not on file  Intimate Partner Violence:   . Fear of Current or Ex-Partner: Not on file  . Emotionally Abused: Not on file  . Physically Abused: Not on file  . Sexually Abused: Not on file    FAMILY HISTORY:  Family History  Problem Relation Age of Onset  . Other Mother  clot that went to heart, deceased age 42ss  . Heart attack Father        age 69s, deceased  . Stroke Father   . Cancer Father        "at death determined he was ate up with cancer"  . Hypertension Sister   . Kidney cancer Sister 14  . Diabetes Brother   . Hypertension Brother   . Endometriosis Daughter   . Heart attack Maternal Grandfather   . Diabetes Sister   . Brain cancer Paternal Aunt   . Cancer Paternal Uncle        NOS  . Cancer Paternal Uncle        NOS  . Cancer Paternal Uncle        NOS  . Colon cancer Neg Hx     ALLERGIES:  is allergic to codeine.  MEDICATIONS:  Current Outpatient Medications  Medication Sig Dispense Refill  . albuterol (PROVENTIL HFA;VENTOLIN HFA) 108 (90 Base) MCG/ACT inhaler Inhale 2 puffs into the lungs every 2 (two) hours as needed for wheezing or shortness of breath (cough). 1 Inhaler 3  . albuterol (PROVENTIL) (2.5 MG/3ML) 0.083% nebulizer solution Take 3 mLs (2.5 mg total) by  nebulization every 6 (six) hours as needed for wheezing or shortness of breath. 75 mL 12  . ALPRAZolam (XANAX) 0.25 MG tablet Take 0.125 mg by mouth daily as needed for anxiety.     Marland Kitchen buPROPion (WELLBUTRIN SR) 150 MG 12 hr tablet Take 1 tablet by mouth daily.    . citalopram (CELEXA) 40 MG tablet Take 40 mg by mouth at bedtime.  2  . DEXILANT 60 MG capsule TAKE 1 CAPSULE BY MOUTH EVERY DAY 90 capsule 3  . gabapentin (NEURONTIN) 300 MG capsule Take 300 mg by mouth 2 (two) times daily.    Marland Kitchen loratadine (CLARITIN) 10 MG tablet Take 10 mg by mouth daily as needed for allergies.     Marland Kitchen lubiprostone (AMITIZA) 8 MCG capsule Take 1 capsule (8 mcg total) by mouth 2 (two) times daily with a meal. Take medication with a meal to avoid nausea. (Patient taking differently: Take 8 mcg by mouth 2 (two) times daily with a meal. Take medication with a meal to avoid nausea. Pt taking daily) 60 capsule 11  . LUMIGAN 0.01 % SOLN Place 1 drop into the right eye at bedtime.    Marland Kitchen LYRICA 75 MG capsule TAKE 1 CAPSULE BY MOUTH EVERYDAY AT BEDTIME (Patient not taking: Reported on 06/22/2019) 30 capsule 2  . Naphazoline-Pheniramine (ALLERGY EYE OP) Place 1-2 drops into both eyes 2 (two) times daily as needed (ITCHY, WATERY EYES).    . ondansetron (ZOFRAN) 8 MG tablet Take 8 mg by mouth daily as needed (CAR SICKNESS).    Marland Kitchen PAZEO 0.7 % SOLN Place 1 drop into both eyes every morning.    Marland Kitchen SIMBRINZA 1-0.2 % SUSP Apply 1 drop to eye 2 (two) times daily.    . sodium chloride (OCEAN) 0.65 % SOLN nasal spray Place 1 spray into both nostrils as needed for congestion.    . sucralfate (CARAFATE) 1 GM/10ML suspension Take 10 mLs (1 g total) by mouth 4 (four) times daily. 420 mL 1  . traMADol (ULTRAM) 50 MG tablet TAKE 1 TABLET BY MOUTH DAILY AS NEEDED. 30 tablet 0  . traZODone (DESYREL) 50 MG tablet Take 50 mg by mouth at bedtime.     No current facility-administered medications for this visit.      Marland Kitchen  PHYSICAL EXAMINATION: ECOG  PERFORMANCE STATUS: 0 - Asymptomatic  There were no vitals filed for this visit. There were no vitals filed for this visit.  Physical Exam  Constitutional: She is oriented to person, place, and time and well-developed, well-nourished, and in no distress.  Patient is alone.  HENT:  Head: Normocephalic and atraumatic.  Mouth/Throat: Oropharynx is clear and moist. No oropharyngeal exudate.  Eyes: Pupils are equal, round, and reactive to light.  Cardiovascular: Normal rate and regular rhythm.  Pulmonary/Chest: No respiratory distress. She has no wheezes.  Abdominal: Soft. Bowel sounds are normal. She exhibits no distension and no mass. There is no abdominal tenderness. There is no rebound and no guarding.  Musculoskeletal:        General: No tenderness or edema. Normal range of motion.     Cervical back: Normal range of motion and neck supple.  Neurological: She is alert and oriented to person, place, and time.  Skin: Skin is warm.     Psychiatric: Affect normal.     LABORATORY DATA:  I have reviewed the data as listed Lab Results  Component Value Date   WBC 8.1 10/25/2019   HGB 12.6 10/25/2019   HCT 39.1 10/25/2019   MCV 93.1 10/25/2019   PLT 244 10/25/2019   Recent Labs    04/24/19 0959 10/14/19 2153 10/25/19 1002  NA 138 136 137  K 3.6 3.5 3.8  CL 103 103 104  CO2 '24 27 24  ' GLUCOSE 100* 121* 99  BUN '10 14 12  ' CREATININE 0.79 0.79 0.79  CALCIUM 8.5* 8.6* 8.8*  GFRNONAA >60 >60 >60  GFRAA >60 >60 >60  PROT 6.9 6.1* 7.0  ALBUMIN 3.7 3.3* 3.8  AST 20 15 14*  ALT '27 19 17  ' ALKPHOS 60 51 56  BILITOT 0.4 0.4 0.4    RADIOGRAPHIC STUDIES: I have personally reviewed the radiological images as listed and agreed with the findings in the report. CT Head Wo Contrast  Result Date: 10/14/2019 CLINICAL DATA:  Headache, normal neuro exam EXAM: CT HEAD WITHOUT CONTRAST TECHNIQUE: Contiguous axial images were obtained from the base of the skull through the vertex without  intravenous contrast. COMPARISON:  CT head 09/15/2014 FINDINGS: Brain: No evidence of acute infarction, hemorrhage, hydrocephalus, extra-axial collection or mass lesion/mass effect. Slightly low positioning of the cerebellar tonsils positioned only 2 mm below the foramen magnum, does not meet diagnostic criteria for Chiari malformation. Vascular: No hyperdense vessel or unexpected calcification. Skull: No calvarial fracture or suspicious osseous lesion. No scalp swelling or hematoma. Sinuses/Orbits: Scattered mucosal thickening in the ethmoids. Paranasal sinuses are otherwise predominantly clear. Mastoid air cells are well aerated. Middle ear cavities are clear. Included orbital structures are unremarkable. Other: None IMPRESSION: 1. No acute intracranial pathology. 2. Slightly low positioning of the cerebellar tonsils positioned only 2 mm below the foramen magnum, does not meet diagnostic criteria for Chiari malformation. Electronically Signed   By: Lovena Le M.D.   On: 10/14/2019 23:00   CT CHEST W CONTRAST  Result Date: 10/20/2019 CLINICAL DATA:  Follow-up of endometrial cancer. Stage IV uterine cancer diagnosed in 2015. EXAM: CT CHEST, ABDOMEN, AND PELVIS WITH CONTRAST TECHNIQUE: Multidetector CT imaging of the chest, abdomen and pelvis was performed following the standard protocol during bolus administration of intravenous contrast. CONTRAST:  169m OMNIPAQUE IOHEXOL 300 MG/ML  SOLN COMPARISON:  09/15/2018 is and CT chest of 09/20/2019 FINDINGS: CT CHEST FINDINGS Cardiovascular: Normal caliber thoracic aorta. Heart size is normal without pericardial effusion. Right  IJ Port-A-Cath terminates at the caval to atrial junction. Central pulmonary arteries are normal. Mediastinum/Nodes: No signs of adenopathy in the mediastinum. No signs of hilar adenopathy. No thoracic inlet adenopathy or axillary lymphadenopathy. Lungs/Pleura: Signs of lingular scarring and right middle lobe scarring. No suspicious nodule in  the chest. No pleural effusion. Airways are patent. Musculoskeletal: No signs of chest wall mass. CT ABDOMEN PELVIS FINDINGS Hepatobiliary: Tiny area of hypo density in the right hepatic lobe may represent a small hemangioma but is unchanged since prior exam of December of 2019. No signs of suspicious, focal hepatic lesion. Gallbladder is normal. No signs of biliary ductal dilation. Pancreas: Pancreas is normal. Spleen: Spleen is normal size. No signs of focal splenic lesion. Adrenals/Urinary Tract: Normal adrenal glands. Symmetric renal enhancement without signs of hydronephrosis. Urinary bladder is normal. Stomach/Bowel: No acute gastrointestinal process. Signs of partial colonic resection, right hemicolectomy, with patulous anastomotic area in the right hemiabdomen. No signs of bowel obstruction or acute bowel process. Vascular/Lymphatic: Calcified and noncalcified atherosclerotic plaque throughout the abdominal aorta is mild and without signs of aneurysmal dilation. No signs of adenopathy in the retroperitoneum or in the upper abdomen. No signs of pelvic lymphadenopathy. Scattered small lymph nodes throughout the pelvis are unchanged. Reproductive: Post hysterectomy. No signs of pelvic mass. Other: No signs of peritoneal disease. No ascites. Musculoskeletal: No signs of acute bone finding or evidence of destructive bone lesion. IMPRESSION: 1. No evidence of metastatic disease in the chest, abdomen or pelvis. 2. Signs of right hemicolectomy and hysterectomy with bilateral salpingo-oophorectomy. 3.  Aortic Atherosclerosis (ICD10-I70.0). Electronically Signed   By: Zetta Bills M.D.   On: 10/20/2019 16:23   CT ABDOMEN PELVIS W CONTRAST  Result Date: 10/20/2019 CLINICAL DATA:  Follow-up of endometrial cancer. Stage IV uterine cancer diagnosed in 2015. EXAM: CT CHEST, ABDOMEN, AND PELVIS WITH CONTRAST TECHNIQUE: Multidetector CT imaging of the chest, abdomen and pelvis was performed following the standard  protocol during bolus administration of intravenous contrast. CONTRAST:  156m OMNIPAQUE IOHEXOL 300 MG/ML  SOLN COMPARISON:  09/15/2018 is and CT chest of 09/20/2019 FINDINGS: CT CHEST FINDINGS Cardiovascular: Normal caliber thoracic aorta. Heart size is normal without pericardial effusion. Right IJ Port-A-Cath terminates at the caval to atrial junction. Central pulmonary arteries are normal. Mediastinum/Nodes: No signs of adenopathy in the mediastinum. No signs of hilar adenopathy. No thoracic inlet adenopathy or axillary lymphadenopathy. Lungs/Pleura: Signs of lingular scarring and right middle lobe scarring. No suspicious nodule in the chest. No pleural effusion. Airways are patent. Musculoskeletal: No signs of chest wall mass. CT ABDOMEN PELVIS FINDINGS Hepatobiliary: Tiny area of hypo density in the right hepatic lobe may represent a small hemangioma but is unchanged since prior exam of December of 2019. No signs of suspicious, focal hepatic lesion. Gallbladder is normal. No signs of biliary ductal dilation. Pancreas: Pancreas is normal. Spleen: Spleen is normal size. No signs of focal splenic lesion. Adrenals/Urinary Tract: Normal adrenal glands. Symmetric renal enhancement without signs of hydronephrosis. Urinary bladder is normal. Stomach/Bowel: No acute gastrointestinal process. Signs of partial colonic resection, right hemicolectomy, with patulous anastomotic area in the right hemiabdomen. No signs of bowel obstruction or acute bowel process. Vascular/Lymphatic: Calcified and noncalcified atherosclerotic plaque throughout the abdominal aorta is mild and without signs of aneurysmal dilation. No signs of adenopathy in the retroperitoneum or in the upper abdomen. No signs of pelvic lymphadenopathy. Scattered small lymph nodes throughout the pelvis are unchanged. Reproductive: Post hysterectomy. No signs of pelvic mass. Other: No signs  of peritoneal disease. No ascites. Musculoskeletal: No signs of acute bone  finding or evidence of destructive bone lesion. IMPRESSION: 1. No evidence of metastatic disease in the chest, abdomen or pelvis. 2. Signs of right hemicolectomy and hysterectomy with bilateral salpingo-oophorectomy. 3.  Aortic Atherosclerosis (ICD10-I70.0). Electronically Signed   By: Zetta Bills M.D.   On: 10/20/2019 16:23   DG Chest Portable 1 View  Result Date: 10/14/2019 CLINICAL DATA:  COVID symptoms. Body aches. Cough. Headache and nausea. Chills. EXAM: PORTABLE CHEST 1 VIEW COMPARISON:  Chest CT 09/20/2019, radiograph 09/27/2017 FINDINGS: Right chest port with tip in the SVC. Normal heart size and mediastinal contours. Focal airspace disease. Mild chronic interstitial coarsening. No pulmonary edema, pleural effusion, or pneumothorax. No acute osseous abnormalities are seen. IMPRESSION: No acute findings or evidence of pneumonia. Electronically Signed   By: Keith Rake M.D.   On: 10/14/2019 21:51    ASSESSMENT & PLAN:   Endometrial cancer ([place]) # Uterine Cancer endometrioid type grade 2; stage IV [lung nodules at presentation 2015/]-February 2021- CT scan abdomen pelvis/chest-negative for any obvious recurrence/positive for constipation.  Stable.  No clinical evidence of recurrence.   # Lynch syndrome Ozzie Hoyle of high-grade polyp/status post colectomy: Last colonoscopy-Feb 2020.  EGD- Feb 2020-reviewed.  Stable.  Urine cytology pending.  Discussed with Dr. Theora Gianotti.  #Active smoker on Wellbutrin-discussed smoking cessation.  #Peripheral neuropathy grade 2-3; stable continue tramadol as needed.  # Constipation- on amitizia-stable  DISPOSITION: # port flush every 3 months # follow up in 6 months/labs- cbc/cmp/ca-125//urine cytology--Dr.B     All questions were answered. The patient knows to call the clinic with any problems, questions or concerns.     Cammie Sickle, MD 10/31/2019 1:02 PM

## 2019-10-26 LAB — CA 125: Cancer Antigen (CA) 125: 12.4 U/mL (ref 0.0–38.1)

## 2019-10-27 DIAGNOSIS — H16223 Keratoconjunctivitis sicca, not specified as Sjogren's, bilateral: Secondary | ICD-10-CM | POA: Diagnosis not present

## 2019-10-27 DIAGNOSIS — H40052 Ocular hypertension, left eye: Secondary | ICD-10-CM | POA: Diagnosis not present

## 2019-10-27 DIAGNOSIS — H401121 Primary open-angle glaucoma, left eye, mild stage: Secondary | ICD-10-CM | POA: Diagnosis not present

## 2019-10-27 DIAGNOSIS — H401112 Primary open-angle glaucoma, right eye, moderate stage: Secondary | ICD-10-CM | POA: Diagnosis not present

## 2019-11-20 ENCOUNTER — Other Ambulatory Visit: Payer: Self-pay

## 2019-11-20 ENCOUNTER — Telehealth: Payer: Self-pay

## 2019-11-20 DIAGNOSIS — Z1509 Genetic susceptibility to other malignant neoplasm: Secondary | ICD-10-CM

## 2019-11-20 NOTE — Telephone Encounter (Signed)
-----   Message from Rushie Chestnut, [place] sent at 08/22/2019  1:17 PM EST ----- Regarding: Pt due for Capsule Study Contact pt to inquire if she would like to proceed with rescheduling the Capsule Study this month.

## 2019-11-20 NOTE — Telephone Encounter (Signed)
Spoke with pt and was able to schedule the Capsule Study on 12-11-19.

## 2019-11-20 NOTE — Telephone Encounter (Signed)
Called pt to inquire if she's ready to proceed with scheduling the Capsule study.  Unable to contact, LVM to return call

## 2019-11-23 ENCOUNTER — Ambulatory Visit: Payer: Medicare Other | Admitting: Gastroenterology

## 2019-12-11 ENCOUNTER — Encounter: Payer: Self-pay | Admitting: Anesthesiology

## 2019-12-11 ENCOUNTER — Ambulatory Visit: Admission: RE | Admit: 2019-12-11 | Payer: Medicare Other | Source: Home / Self Care | Admitting: Gastroenterology

## 2019-12-11 ENCOUNTER — Encounter: Admission: RE | Payer: Self-pay | Source: Home / Self Care

## 2019-12-11 SURGERY — IMAGING PROCEDURE, GI TRACT, INTRALUMINAL, VIA CAPSULE

## 2019-12-18 ENCOUNTER — Other Ambulatory Visit: Payer: Self-pay

## 2019-12-18 DIAGNOSIS — Z1509 Genetic susceptibility to other malignant neoplasm: Secondary | ICD-10-CM

## 2019-12-25 ENCOUNTER — Ambulatory Visit
Admission: RE | Admit: 2019-12-25 | Discharge: 2019-12-25 | Disposition: A | Discharge status: Home / Self Care | Payer: Medicare Other | Attending: Gastroenterology | Admitting: Gastroenterology

## 2019-12-25 ENCOUNTER — Encounter
Admission: RE | Disposition: A | Discharge status: Home / Self Care | Payer: Self-pay | Source: Home / Self Care | Attending: Gastroenterology

## 2019-12-25 DIAGNOSIS — K317 Polyp of stomach and duodenum: Secondary | ICD-10-CM | POA: Diagnosis not present

## 2019-12-25 DIAGNOSIS — Z1509 Genetic susceptibility to other malignant neoplasm: Secondary | ICD-10-CM | POA: Insufficient documentation

## 2019-12-25 HISTORY — PX: GIVENS CAPSULE STUDY: SHX5432

## 2019-12-25 SURGERY — IMAGING PROCEDURE, GI TRACT, INTRALUMINAL, VIA CAPSULE

## 2019-12-26 ENCOUNTER — Encounter: Payer: Self-pay | Admitting: *Deleted

## 2020-01-08 ENCOUNTER — Telehealth: Payer: Self-pay | Admitting: Gastroenterology

## 2020-01-08 NOTE — Telephone Encounter (Signed)
Patient called & l/m on v/m checking on her result for her capsule study.

## 2020-01-18 NOTE — Telephone Encounter (Signed)
Called pt to inform her we're still waiting to receive the Capsule Study results from the hospital and explained that we will contact her once we receive the results and Dr. Georgeann Oppenheim recommendations.  Unable to contact, left detailed VM

## 2020-01-24 ENCOUNTER — Other Ambulatory Visit: Payer: Self-pay

## 2020-01-24 ENCOUNTER — Inpatient Hospital Stay: Payer: Medicare Other | Attending: Internal Medicine

## 2020-01-24 DIAGNOSIS — Z452 Encounter for adjustment and management of vascular access device: Secondary | ICD-10-CM | POA: Insufficient documentation

## 2020-01-24 DIAGNOSIS — C541 Malignant neoplasm of endometrium: Secondary | ICD-10-CM | POA: Diagnosis not present

## 2020-01-24 DIAGNOSIS — Z95828 Presence of other vascular implants and grafts: Secondary | ICD-10-CM

## 2020-01-24 MED ORDER — HEPARIN SOD (PORK) LOCK FLUSH 100 UNIT/ML IV SOLN
500.0000 [IU] | Freq: Once | INTRAVENOUS | Status: AC
  Administered 2020-01-24: 500 [IU] via INTRAVENOUS
  Filled 2020-01-24: qty 5

## 2020-01-24 MED ORDER — SODIUM CHLORIDE 0.9% FLUSH
10.0000 mL | Freq: Once | INTRAVENOUS | Status: AC
  Administered 2020-01-24: 14:00:00 10 mL via INTRAVENOUS
  Filled 2020-01-24: qty 10

## 2020-01-24 MED ORDER — HEPARIN SOD (PORK) LOCK FLUSH 100 UNIT/ML IV SOLN
INTRAVENOUS | Status: AC
  Filled 2020-01-24: qty 5

## 2020-01-24 NOTE — Telephone Encounter (Signed)
Spoke with pt and informed her of Dr. Georgeann Oppenheim plan to discuss results in-person. Pt has agreed and has been scheduled.

## 2020-01-29 ENCOUNTER — Ambulatory Visit (INDEPENDENT_AMBULATORY_CARE_PROVIDER_SITE_OTHER): Payer: Medicare Other | Admitting: Gastroenterology

## 2020-01-29 ENCOUNTER — Other Ambulatory Visit: Payer: Self-pay

## 2020-01-29 VITALS — BP 129/76 | HR 67 | Temp 98.6°F | Ht 63.0 in | Wt 172.8 lb

## 2020-01-29 DIAGNOSIS — Z1509 Genetic susceptibility to other malignant neoplasm: Secondary | ICD-10-CM

## 2020-01-29 DIAGNOSIS — K5520 Angiodysplasia of colon without hemorrhage: Secondary | ICD-10-CM

## 2020-01-29 DIAGNOSIS — R1032 Left lower quadrant pain: Secondary | ICD-10-CM | POA: Diagnosis not present

## 2020-01-29 MED ORDER — DICYCLOMINE HCL 10 MG PO CAPS: 10.0000 mg | ORAL_CAPSULE | Freq: Three times a day (TID) | ORAL | 2 refills | Status: DC

## 2020-01-29 NOTE — Progress Notes (Signed)
Jonathon Bellows MD, MRCP(U.K) [address]  [place], [place] [address]  Main: [phone]  Fax: [phone]   Primary Care Physician: Philmore Pali, NP  Primary Gastroenterologist:  Dr. Jonathon Bellows   Discuss results of recent capsule study   HPI: Julia Osborne is a 59 y.o. female   Summary of history :  History referred and seen in February 2020 for dysphagia and Lynch syndrome. She has a history of endometrial cancer, colon cancer, GERD. She follows with Dr. Rogue Bussing  in oncology. [facility] and last seen in February 2019. Carries a history of dysphagia, GERD and CA of her colon,status post right hemicolectomy. History of stage IV uterine cancer. Colon polyp resected in October 2016 showed an adenoma with focal high-grade dysplasia.  10/24/18: H pylori - negative  11/03/2018: [id number] duodenal . Gastric bx shows mild chronic gastritis .  Colonoscopy: Hyperplastic rectal polyp.  04/24/2019:normal CBC and CMP.  Interval history 08/22/2019-01/29/2020  12/25/2019: Capsule study of the small bowel showed a few tiny polyps in the proximal small bowel were noted.  Probably 2 to 3 mm in size.  A few lesions suggestive of small bowel AVMs were seen in the distal small bowel.  Nonbleeding. She has not yet had a skin appointment to have an annual skin exam.  Only complaint is some episodic left-sided abdominal pain.  Colicky.  Associated with gas and bloating.  Eating makes it worse.  Denies any consumption of artificial sugars.  Denies any constipation.  Ongoing for many months if not longer.  CT scan of the abdomen pelvis in January 2021 demonstrates no evidence of metastatic disease in the chest abdomen or pelvis.    Current Outpatient Medications  Medication Sig Dispense Refill  . albuterol (PROVENTIL HFA;VENTOLIN HFA) 108 (90 Base) MCG/ACT inhaler Inhale 2 puffs into the lungs every 2 (two) hours as  needed for wheezing or shortness of breath (cough). 1 Inhaler 3  . albuterol (PROVENTIL) (2.5 MG/3ML) 0.083% nebulizer solution Take 3 mLs (2.5 mg total) by nebulization every 6 (six) hours as needed for wheezing or shortness of breath. 75 mL 12  . ALPRAZolam (XANAX) 0.25 MG tablet Take 0.125 mg by mouth daily as needed for anxiety.     Marland Kitchen buPROPion (WELLBUTRIN SR) 150 MG 12 hr tablet Take 1 tablet by mouth daily.    . citalopram (CELEXA) 40 MG tablet Take 40 mg by mouth at bedtime.  2  . DEXILANT 60 MG capsule TAKE 1 CAPSULE BY MOUTH EVERY DAY 90 capsule 3  . gabapentin (NEURONTIN) 300 MG capsule Take 300 mg by mouth 2 (two) times daily.    Marland Kitchen loratadine (CLARITIN) 10 MG tablet Take 10 mg by mouth daily as needed for allergies.     Marland Kitchen lubiprostone (AMITIZA) 8 MCG capsule Take 1 capsule (8 mcg total) by mouth 2 (two) times daily with a meal. Take medication with a meal to avoid nausea. (Patient taking differently: Take 8 mcg by mouth 2 (two) times daily with a meal. Take medication with a meal to avoid nausea. Pt taking daily) 60 capsule 11  . LUMIGAN 0.01 % SOLN Place 1 drop into the right eye at bedtime.    Marland Kitchen LYRICA 75 MG capsule TAKE 1 CAPSULE BY MOUTH EVERYDAY AT BEDTIME (Patient not taking: Reported on 06/22/2019) 30 capsule 2  . Naphazoline-Pheniramine (ALLERGY EYE OP) Place 1-2 drops into both eyes 2 (two) times daily as needed (ITCHY, WATERY EYES).    Marland Kitchen  ondansetron (ZOFRAN) 8 MG tablet Take 8 mg by mouth daily as needed (CAR SICKNESS).    Marland Kitchen PAZEO 0.7 % SOLN Place 1 drop into both eyes every morning.    Marland Kitchen SIMBRINZA 1-0.2 % SUSP Apply 1 drop to eye 2 (two) times daily.    . sodium chloride (OCEAN) 0.65 % SOLN nasal spray Place 1 spray into both nostrils as needed for congestion.    . sucralfate (CARAFATE) 1 GM/10ML suspension Take 10 mLs (1 g total) by mouth 4 (four) times daily. 420 mL 1  . traMADol (ULTRAM) 50 MG tablet TAKE 1 TABLET BY MOUTH DAILY AS NEEDED. 30 tablet 0  . traZODone (DESYREL)  50 MG tablet Take 50 mg by mouth at bedtime.     No current facility-administered medications for this visit.    Allergies as of 01/29/2020 - Review Complete 12/25/2019  Allergen Reaction Noted  . Codeine Rash 08/25/2011    ROS:  General: Negative for anorexia, weight loss, fever, chills, fatigue, weakness. ENT: Negative for hoarseness, difficulty swallowing , nasal congestion. CV: Negative for chest pain, angina, palpitations, dyspnea on exertion, peripheral edema.  Respiratory: Negative for dyspnea at rest, dyspnea on exertion, cough, sputum, wheezing.  GI: See history of present illness. GU:  Negative for dysuria, hematuria, urinary incontinence, urinary frequency, nocturnal urination.  Endo: Negative for unusual weight change.    Physical Examination:   There were no vitals taken for this visit.  General: Well-nourished, well-developed in no acute distress.  Eyes: No icterus. Conjunctivae pink. Mouth: Oropharyngeal mucosa moist and pink , no lesions erythema or exudate. Lungs: Clear to auscultation bilaterally. Non-labored. Heart: Regular rate and rhythm, no murmurs rubs or gallops.  Abdomen: Bowel sounds are normal, nontender, nondistended, no hepatosplenomegaly or masses, no abdominal bruits or hernia , no rebound or guarding.   Extremities: No lower extremity edema. No clubbing or deformities. Neuro: Alert and oriented x 3.  Grossly intact. Skin: Warm and dry, no jaundice.   Psych: Alert and cooperative, normal mood and affect.   Imaging Studies: No results found.  Assessment and Plan:   Julia Osborne is a 59 y.o. y/o female here to follow up for health maintanance for Lynch syndrome.  History of colon cancer with right hemicolectomy, uterine cancer.   Recent capsule study of small bowel showed some small tiny polyps in the proximal small bowel.  This should easily be reached with a push enteroscopy.  There are some tiny small AVMs in the distal small bowel.  They  were nonbleeding.  Since she has no history of anemia we will not require any further intervention at this point of time.  1. Stop smoking 2. Push enteroscopy  3. NCCN recommends capsule study of the small bowel tumors but othersocietiesdo not 4.. Requires annual skin exam to screen for skin cancer.did not obtain the same after last visit will refer again. 5. Annual urine analysis- will check this visit  6. Colonoscopy every 2 to 3 years:Next due in February 2022.  7.  Suggest a low FODMAP diet trial of IBgard and Bentyl.  If the issue of left lower quadrant pain persists then will consider evaluation for bacterial overgrowth syndrome.   I have discussed alternative options, risks & benefits,  which include, but are not limited to, bleeding, infection, perforation,respiratory complication & drug reaction.  The patient agrees with this plan & written consent will be obtained.      Dr Jonathon Bellows  MD,MRCP [facility]) Follow up in 1  year

## 2020-01-30 ENCOUNTER — Encounter: Payer: Self-pay | Admitting: Gastroenterology

## 2020-01-30 LAB — URINALYSIS
Bilirubin, UA: NEGATIVE
Glucose, UA: NEGATIVE
Ketones, UA: NEGATIVE
Leukocytes,UA: NEGATIVE
Nitrite, UA: NEGATIVE
Protein,UA: NEGATIVE
RBC, UA: NEGATIVE
Specific Gravity, UA: 1.006 (ref 1.005–1.030)
Urobilinogen, Ur: 0.2 mg/dL (ref 0.2–1.0)
pH, UA: 6.5 (ref 5.0–7.5)

## 2020-02-08 ENCOUNTER — Other Ambulatory Visit
Admission: RE | Admit: 2020-02-08 | Discharge: 2020-02-08 | Disposition: A | Discharge status: Home / Self Care | Payer: Medicare Other | Source: Ambulatory Visit | Attending: Gastroenterology | Admitting: Gastroenterology

## 2020-02-08 DIAGNOSIS — Z20822 Contact with and (suspected) exposure to covid-19: Secondary | ICD-10-CM | POA: Diagnosis not present

## 2020-02-08 DIAGNOSIS — Z01812 Encounter for preprocedural laboratory examination: Secondary | ICD-10-CM | POA: Insufficient documentation

## 2020-02-09 LAB — SARS CORONAVIRUS 2 (TAT 6-24 HRS): SARS Coronavirus 2: NEGATIVE

## 2020-02-12 ENCOUNTER — Encounter
Admission: RE | Disposition: A | Discharge status: Home / Self Care | Payer: Self-pay | Source: Home / Self Care | Attending: Gastroenterology

## 2020-02-12 ENCOUNTER — Ambulatory Visit: Payer: Medicare Other | Admitting: Registered Nurse

## 2020-02-12 ENCOUNTER — Other Ambulatory Visit: Payer: Self-pay

## 2020-02-12 ENCOUNTER — Ambulatory Visit
Admission: RE | Admit: 2020-02-12 | Discharge: 2020-02-12 | Disposition: A | Discharge status: Home / Self Care | Payer: Medicare Other | Attending: Gastroenterology | Admitting: Gastroenterology

## 2020-02-12 DIAGNOSIS — R933 Abnormal findings on diagnostic imaging of other parts of digestive tract: Secondary | ICD-10-CM

## 2020-02-12 DIAGNOSIS — F17210 Nicotine dependence, cigarettes, uncomplicated: Secondary | ICD-10-CM | POA: Diagnosis not present

## 2020-02-12 DIAGNOSIS — F419 Anxiety disorder, unspecified: Secondary | ICD-10-CM | POA: Insufficient documentation

## 2020-02-12 DIAGNOSIS — Z85038 Personal history of other malignant neoplasm of large intestine: Secondary | ICD-10-CM | POA: Diagnosis not present

## 2020-02-12 DIAGNOSIS — H409 Unspecified glaucoma: Secondary | ICD-10-CM | POA: Diagnosis not present

## 2020-02-12 DIAGNOSIS — J449 Chronic obstructive pulmonary disease, unspecified: Secondary | ICD-10-CM | POA: Diagnosis not present

## 2020-02-12 DIAGNOSIS — K2980 Duodenitis without bleeding: Secondary | ICD-10-CM | POA: Diagnosis not present

## 2020-02-12 DIAGNOSIS — Z86010 Personal history of colonic polyps: Secondary | ICD-10-CM | POA: Insufficient documentation

## 2020-02-12 DIAGNOSIS — K317 Polyp of stomach and duodenum: Secondary | ICD-10-CM | POA: Diagnosis not present

## 2020-02-12 DIAGNOSIS — K5520 Angiodysplasia of colon without hemorrhage: Secondary | ICD-10-CM

## 2020-02-12 DIAGNOSIS — Z8542 Personal history of malignant neoplasm of other parts of uterus: Secondary | ICD-10-CM | POA: Diagnosis not present

## 2020-02-12 DIAGNOSIS — D132 Benign neoplasm of duodenum: Secondary | ICD-10-CM

## 2020-02-12 DIAGNOSIS — Z9049 Acquired absence of other specified parts of digestive tract: Secondary | ICD-10-CM | POA: Insufficient documentation

## 2020-02-12 DIAGNOSIS — Z1509 Genetic susceptibility to other malignant neoplasm: Secondary | ICD-10-CM | POA: Diagnosis not present

## 2020-02-12 DIAGNOSIS — Z79899 Other long term (current) drug therapy: Secondary | ICD-10-CM | POA: Diagnosis not present

## 2020-02-12 DIAGNOSIS — K219 Gastro-esophageal reflux disease without esophagitis: Secondary | ICD-10-CM | POA: Insufficient documentation

## 2020-02-12 DIAGNOSIS — G620 Drug-induced polyneuropathy: Secondary | ICD-10-CM | POA: Insufficient documentation

## 2020-02-12 DIAGNOSIS — F329 Major depressive disorder, single episode, unspecified: Secondary | ICD-10-CM | POA: Diagnosis not present

## 2020-02-12 HISTORY — PX: ENTEROSCOPY: SHX5533

## 2020-02-12 SURGERY — ENTEROSCOPY
Anesthesia: General

## 2020-02-12 MED ORDER — LIDOCAINE HCL (PF) 1 % IJ SOLN
INTRAMUSCULAR | Status: AC
  Filled 2020-02-12: qty 2

## 2020-02-12 MED ORDER — SODIUM CHLORIDE 0.9 % IV SOLN
INTRAVENOUS | Status: DC
  Administered 2020-02-12: 1000 mL via INTRAVENOUS

## 2020-02-12 MED ORDER — LIDOCAINE HCL (CARDIAC) PF 100 MG/5ML IV SOSY
PREFILLED_SYRINGE | INTRAVENOUS | Status: DC | PRN
  Administered 2020-02-12: 20 mg via INTRAVENOUS
  Administered 2020-02-12: 80 mg via INTRAVENOUS

## 2020-02-12 MED ORDER — PROPOFOL 500 MG/50ML IV EMUL
INTRAVENOUS | Status: DC | PRN
  Administered 2020-02-12: 150 ug/kg/min via INTRAVENOUS

## 2020-02-12 MED ORDER — PROPOFOL 10 MG/ML IV BOLUS
INTRAVENOUS | Status: DC | PRN
  Administered 2020-02-12: 20 mg via INTRAVENOUS
  Administered 2020-02-12: 70 mg via INTRAVENOUS

## 2020-02-12 NOTE — Anesthesia Postprocedure Evaluation (Signed)
Anesthesia Post Note  Patient: Julia Osborne  Procedure(s) Performed: ENTEROSCOPY (N/A )  Patient location during evaluation: Endoscopy Anesthesia Type: General Level of consciousness: awake and alert and oriented Pain management: pain level controlled Vital Signs Assessment: post-procedure vital signs reviewed and stable Respiratory status: spontaneous breathing, nonlabored ventilation and respiratory function stable Cardiovascular status: blood pressure returned to baseline and stable Postop Assessment: no signs of nausea or vomiting Anesthetic complications: no     Last Vitals:  Vitals:   02/12/20 1213 02/12/20 1223  BP: 123/85 127/71  Pulse:  (!) 59  Resp:  12  Temp:    SpO2:  100%    Last Pain:  Vitals:   02/12/20 1223  TempSrc:   PainSc: 0-No pain                 Karlissa Aron

## 2020-02-12 NOTE — Op Note (Signed)
[facility] Gastroenterology Patient Name: Julia Osborne Procedure Date: 02/12/2020 11:14 AM MRN: [id number] Account #: [id number] Date of Birth: [birth year] Admit Type: Outpatient Age: 59 Room: [facility] ENDO ROOM 4 Gender: Female Note Status: Finalized Procedure:             Small bowel enteroscopy Indications:           Abnormal video capsule endoscopy Providers:             Jonathon Bellows MD, MD Medicines:             Monitored Anesthesia Care Complications:         No immediate complications. Procedure:             Pre-Anesthesia Assessment:                        - Prior to the procedure, a History and Physical was                         performed, and patient medications, allergies and                         sensitivities were reviewed. The patient's tolerance                         of previous anesthesia was reviewed.                        - The risks and benefits of the procedure and the                         sedation options and risks were discussed with the                         patient. All questions were answered and informed                         consent was obtained.                        - ASA Grade Assessment: II - A patient with mild                         systemic disease.                        After obtaining informed consent, the endoscope was                         passed under direct vision. Throughout the procedure,                         the patient's blood pressure, pulse, and oxygen                         saturations were monitored continuously. The was                         introduced through the mouth and advanced to the  proximal jejunum. The small bowel enteroscopy was                         accomplished with ease. The patient tolerated the                         procedure well. Findings:      Multiple 2 to 4 mm sessile polyps were found in the first portion of the       duodenum. The polyp was  removed with a cold biopsy forceps. Resection       and retrieval were complete.      The esophagus was normal.      The stomach was normal.      There was no evidence of significant pathology in the entire examined       portion of jejunum. Impression:            - Multiple duodenal polyps. Resected and retrieved.                        - Normal esophagus.                        - Normal stomach.                        - The examined portion of the jejunum was normal. Recommendation:        - Discharge patient to home (with escort).                        - Advance diet as tolerated.                        - Return to my office as previously scheduled. Procedure Code(s):     --- Professional ---                        [phone], Small intestinal endoscopy, enteroscopy beyond                         second portion of duodenum, not including ileum; with                         biopsy, single or multiple Diagnosis Code(s):     --- Professional ---                        K31.7, Polyp of stomach and duodenum                        R93.3, Abnormal findings on diagnostic imaging of                         other parts of digestive tract CPT copyright 2019 American Medical Association. All rights reserved. The codes documented in this report are preliminary and upon coder review may  be revised to meet current compliance requirements. Jonathon Bellows, MD Jonathon Bellows MD, MD 02/12/2020 11:51:16 AM This report has been signed electronically. Number of Addenda: 0 Note Initiated On: 02/12/2020 11:14 AM Estimated Blood Loss:  Estimated blood loss: none.      [facility]

## 2020-02-12 NOTE — H&P (Signed)
Jonathon Bellows, MD [address], [place], [place], [place], [address] [address], [place], [place], [place], [address] Phone: [phone]  Fax: [phone]  Primary Care Physician:  Philmore Pali, NP   Pre-Procedure History & Physical: HPI:  Julia Osborne is a 59 y.o. female is here for an endoscopy    Past Medical History:  Diagnosis Date  . Adenocarcinoma of colon ([place]) 09/13/2015   Partial colon resection and chemo tx's.   . Anemia   . Anxiety   . Asthma   . Cancer ([place])    endometrial; cancer cells in intestine  . COPD (chronic obstructive pulmonary disease) ([place])    no definite diagnosis  . Depression   . Diverticulitis   . Dyspareunia 05/16/2015  . Family history of cancer   . Family history of kidney cancer   . GERD (gastroesophageal reflux disease)   . Glaucoma   . Indigestion   . Lynch syndrome   . Neuropathy    feet and hands  . Uterine fibroid   . Vaginal dryness 05/16/2015  . Vaginal itching 07/16/2015  . Vaginal Pap smear, abnormal     Past Surgical History:  Procedure Laterality Date  . ABDOMINAL HYSTERECTOMY    . APPENDECTOMY    . BIOPSY N/A 03/14/2015   Procedure: BIOPSY;  Surgeon: Daneil Dolin, MD;  Location: [facility] ORS;  Service: Endoscopy;  Laterality: N/A;  Gastric  . COLONOSCOPY N/A 01/28/2017   Procedure: COLONOSCOPY;  Surgeon: Daneil Dolin, MD;  Location: [facility] ENDO SUITE;  Service: Endoscopy;  Laterality: N/A;  11:30am  . COLONOSCOPY WITH PROPOFOL N/A 03/14/2015   RMR: Internal hemorrhoids. colonic diverticulosis. Incomplete examination. Prepartation inadequate.  . COLONOSCOPY WITH PROPOFOL N/A 07/04/2015   RMR: Colonic diverticulosis . Large polypoid lesion in the vicinity of the hepatic flexure status post saline-assisted piecmeal snare polypectomy  with ablation and tattooing as described. Sigmoid polyp removed as described above. sigmoid colon polyp hyperplastic, hepatic flexure polyp with TA with focal high grade dysplasia   .  COLONOSCOPY WITH PROPOFOL N/A 11/03/2018   Procedure: COLONOSCOPY WITH PROPOFOL;  Surgeon: Jonathon Bellows, MD;  Location: [facility] ENDOSCOPY;  Service: Gastroenterology;  Laterality: N/A;  . ESOPHAGEAL DILATION N/A 03/14/2015   Procedure: ESOPHAGEAL DILATION;  Surgeon: Daneil Dolin, MD;  Location: [facility] ORS;  Service: Endoscopy;  Laterality: N/A;  Maloney 55  . ESOPHAGOGASTRODUODENOSCOPY N/A 05/19/2016   Procedure: ESOPHAGOGASTRODUODENOSCOPY (EGD);  Surgeon: Daneil Dolin, MD;  Location: [facility] END[facility];  Service: Endoscopy;  Laterality: N/A;  215  . ESOPHAGOGASTRODUODENOSCOPY N/A 01/28/2017   Procedure: ESOPHAGOGASTRODUODENOSCOPY (EGD);  Surgeon: Daneil Dolin, MD;  [facility];  Service: Endoscopy;  Laterality: N/A;  . ESOPHAGOGASTRODUODENOSCOPY (EGD) WITH PROPOFOL N/A 03/14/2015   RMR: Mild erosive reflux esophagitis status post passage o f a Maloney dilator. Abnormal gastric mucosa of uncertain significance as described above. status post biopsy, benign  . ESOPHAGOGASTRODUODENOSCOPY (EGD) WITH PROPOFOL N/A 11/18/2017   Procedure: ESOPHAGOGASTRODUODENOSCOPY (EGD) WITH PROPOFOL;  Surgeon: Daneil Dolin, MD;  [facility];  Service: Endoscopy;  Laterality: N/A;  12:15pm  . ESOPHAGOGASTRODUODENOSCOPY (EGD) WITH PROPOFOL N/A 11/03/2018   Procedure: ESOPHAGOGASTRODUODENOSCOPY (EGD) WITH PROPOFOL;  Surgeon: Jonathon Bellows, MD;  Location: [facility] ENDOSCOPY;  Service: Gastroenterology;  Laterality: N/A;  . GIVENS CAPSULE STUDY N/A 12/25/2019   Procedure: GIVENS CAPSULE STUDY;  Surgeon: Jonathon Bellows, MD;  Location: [facility] ENDOSCOPY;  Service: Gastroenterology;  Laterality: N/A;  . MALONEY DILATION N/A 05/19/2016  Procedure: MALONEY DILATION;  Surgeon: Daneil Dolin, MD;  [facility];  Service: Endoscopy;  Laterality: N/A;  . [name] DILATION N/A 01/28/2017   Procedure: [name] DILATION;  Surgeon: Daneil Dolin, MD;  [facility];  Service: Endoscopy;  Laterality: N/A;  [name] DILATION N/A 11/18/2017   Procedure: [name] DILATION;  Surgeon: Daneil Dolin, MD;  [facility];  Service: Endoscopy;  Laterality: N/A;  . PARTIAL COLECTOMY  08/30/2015   polyp with adenocarcinoma  . POLYPECTOMY N/A 07/04/2015   Procedure: POLYPECTOMY;  Surgeon: Daneil Dolin, MD;  Location: [facility] ORS;  Service: Endoscopy;  Laterality: N/A;  . PORTACATH PLACEMENT Right 09/2014  . TUBAL LIGATION      Prior to Admission medications   Medication Sig Start Date End Date Taking? Authorizing Provider  albuterol (PROVENTIL HFA;VENTOLIN HFA) 108 (90 Base) MCG/ACT inhaler Inhale 2 puffs into the lungs every 2 (two) hours as needed for wheezing or shortness of breath (cough). 10/17/15  Yes Annitta Needs, NP  albuterol (PROVENTIL) (2.5 MG/3ML) 0.083% nebulizer solution Take 3 mLs (2.5 mg total) by nebulization every 6 (six) hours as needed for wheezing or shortness of breath. 01/16/16  Yes Penland, Kelby Fam, MD  ALPRAZolam Duanne Moron) 0.25 MG tablet Take 0.125 mg by mouth daily as needed for anxiety.     [provider]  buPROPion (WELLBUTRIN SR) 150 MG 12 hr tablet Take 1 tablet by mouth daily. 07/25/18   [provider]  citalopram (CELEXA) 40 MG tablet Take 40 mg by mouth at bedtime. 01/05/17   [provider]  DEXILANT 60 MG capsule TAKE 1 CAPSULE BY MOUTH EVERY DAY 02/24/19   Carlis Stable, NP  dicyclomine (BENTYL) 10 MG capsule Take 1 capsule (10 mg total) by mouth 4 (four) times daily -  before meals and at bedtime. 01/29/20   Jonathon Bellows, MD  gabapentin (NEURONTIN) 300 MG capsule Take 300 mg by mouth 2 (two) times daily. 03/10/19   [provider]  loratadine (CLARITIN) 10 MG tablet Take 10 mg by mouth daily as needed for allergies.     [provider]  lubiprostone (AMITIZA) 8 MCG capsule Take 1 capsule (8 mcg total) by mouth 2 (two) times daily with a meal. Take medication with a meal to avoid nausea. Patient taking differently: Take 8 mcg by  mouth 2 (two) times daily with a meal. Take medication with a meal to avoid nausea. Pt taking daily 01/13/19   Rourk, Cristopher Estimable, MD  LUMIGAN 0.01 % SOLN Place 1 drop into the right eye at bedtime. 06/24/18   [provider]  LYRICA 75 MG capsule TAKE 1 CAPSULE BY MOUTH EVERYDAY AT BEDTIME Patient not taking: Reported on 06/22/2019 10/24/17   Holley Bouche, NP  Naphazoline-Pheniramine (ALLERGY EYE OP) Place 1-2 drops into both eyes 2 (two) times daily as needed (ITCHY, WATERY EYES).    [provider]  ondansetron (ZOFRAN) 8 MG tablet Take 8 mg by mouth daily as needed (CAR SICKNESS).    [provider]  PAZEO 0.7 % SOLN Place 1 drop into both eyes every morning. 06/27/18   [provider]  SIMBRINZA 1-0.2 % SUSP Apply 1 drop to eye 2 (two) times daily. 04/04/19   [provider]  sodium chloride (OCEAN) 0.65 % SOLN nasal spray Place 1 spray into both nostrils as needed for congestion.    [provider]  sucralfate (CARAFATE) 1 GM/10ML suspension Take 10  mLs (1 g total) by mouth 4 (four) times daily. 10/24/18   Jonathon Bellows, MD  traMADol (ULTRAM) 50 MG tablet TAKE 1 TABLET BY MOUTH DAILY AS NEEDED. 06/07/19   Verlon Au, NP  traZODone (DESYREL) 50 MG tablet Take 50 mg by mouth at bedtime.    [provider]    Allergies as of 01/29/2020 - Review Complete 01/29/2020  Allergen Reaction Noted  . Codeine Rash 08/25/2011    Family History  Problem Relation Age of Onset  . Other Mother        clot that went to heart, deceased age 79ss  . Heart attack Father        age 46s, deceased  . Stroke Father   . Cancer Father        "at death determined he was ate up with cancer"  . Hypertension Sister   . Kidney cancer Sister 34  . Diabetes Brother   . Hypertension Brother   . Endometriosis Daughter   . Heart attack Maternal Grandfather   . Diabetes Sister   . Brain cancer Paternal Aunt   . Cancer Paternal Uncle        NOS  . Cancer  Paternal Uncle        NOS  . Cancer Paternal Uncle        NOS  . Colon cancer Neg Hx     Social History   Socioeconomic History  . Marital status: Married    Spouse name: Not on file  . Number of children: 2  . Years of education: Not on file  . Highest education level: Not on file  Occupational History  . Not on file  Tobacco Use  . Smoking status: Current Every Day Smoker    Packs/day: 2.00    Years: 25.00    Pack years: 50.00    Types: Cigarettes  . Smokeless tobacco: Never Used  . Tobacco comment: Is trying to quit/wean off.  Substance and Sexual Activity  . Alcohol use: No  . Drug use: No  . Sexual activity: Not Currently    Birth control/protection: Surgical    Comment: hyst  Other Topics Concern  . Not on file  Social History Narrative   #  lives [place]; smoking- 1/2 ppd; no alcohol; used to run machines;       FHx-Dad- MI; ? Cancer on autopsy; sisters/aunts- brain; lung cancer x2; oldest sister- kidney cancer      Pt has 81 year old son;daughter 12 years. Brothers- states to have spoken to her family re: importance of them being checked for lynch.    Social Determinants of Health   Financial Resource Strain:   . Difficulty of Paying Living Expenses:   Food Insecurity:   . Worried About Charity fundraiser in the Last Year:   . Arboriculturist in the Last Year:   Transportation Needs:   . Film/video editor (Medical):   Marland Kitchen Lack of Transportation (Non-Medical):   Physical Activity:   . Days of Exercise per Week:   . Minutes of Exercise per Session:   Stress:   . Feeling of Stress :   Social Connections:   . Frequency of Communication with Friends and Family:   . Frequency of Social Gatherings with Friends and Family:   . Attends Religious Services:   . Active Member of Clubs or Organizations:   . Attends Archivist Meetings:   Marland Kitchen Marital Status:   Intimate Partner Violence:   .  Fear of Current or Ex-Partner:   . Emotionally  Abused:   Marland Kitchen Physically Abused:   . Sexually Abused:     Review of Systems: See HPI, otherwise negative ROS  Physical Exam: BP 124/78   Pulse (!) 57   Temp (!) 97.3 F (36.3 C) (Temporal)   Resp 16   Ht 5\' 3"  (1.6 m)   Wt 78.4 kg   SpO2 100%   BMI 30.62 kg/m  General:   Alert,  pleasant and cooperative in NAD Head:  Normocephalic and atraumatic. Neck:  Supple; no masses or thyromegaly. Lungs:  Clear throughout to auscultation, normal respiratory effort.    Heart:  +S1, +S2, Regular rate and rhythm, No edema. Abdomen:  Soft, nontender and nondistended. Normal bowel sounds, without guarding, and without rebound.   Neurologic:  Alert and  oriented x4;  grossly normal neurologically.  Impression/Plan: LAURAETTA OREY is here for an endoscopy  to be performed for  evaluation of small bowel ;polyps    Risks, benefits, limitations, and alternatives regarding endoscopy have been reviewed with the patient.  Questions have been answered.  All parties agreeable.   Jonathon Bellows, MD  02/12/2020, 11:21 AM

## 2020-02-12 NOTE — Anesthesia Preprocedure Evaluation (Signed)
Anesthesia Evaluation  Patient identified by MRN, date of birth, ID band Patient awake    Reviewed: Allergy & Precautions, NPO status , Patient's Chart, lab work & pertinent test results  History of Anesthesia Complications Negative for: history of anesthetic complications  Airway Mallampati: II  TM Distance: >3 FB Neck ROM: Full    Dental no notable dental hx.    Pulmonary asthma , COPD,  COPD inhaler, Current SmokerPatient did not abstain from smoking.,    breath sounds clear to auscultation- rhonchi (-) wheezing      Cardiovascular Exercise Tolerance: Good (-) hypertension(-) CAD, (-) Past MI, (-) Cardiac Stents and (-) CABG  Rhythm:Regular Rate:Normal - Systolic murmurs and - Diastolic murmurs    Neuro/Psych neg Seizures PSYCHIATRIC DISORDERS Anxiety Depression negative neurological ROS     GI/Hepatic Neg liver ROS, GERD  ,  Endo/Other  negative endocrine ROSneg diabetes  Renal/GU negative Renal ROS     Musculoskeletal   Abdominal (+) + obese,   Peds  Hematology  (+) anemia ,   Anesthesia Other Findings Past Medical History: 09/13/2015: Adenocarcinoma of colon ([place])     Comment:  Partial colon resection and chemo tx's.  No date: Anemia No date: Anxiety No date: Asthma No date: [facility])     Comment:  endometrial; cancer cells in intestine No date: COPD (chronic obstructive pulmonary disease) (HCC)     Comment:  no definite diagnosis No date: Depression No date: Diverticulitis 05/16/2015: Dyspareunia No date: Family history of cancer No date: Family history of kidney cancer No date: GERD (gastroesophageal reflux disease) No date: Glaucoma No date: Indigestion No date: Lynch syndrome No date: Neuropathy     Comment:  feet and hands No date: Uterine fibroid 05/16/2015: Vaginal dryness 07/16/2015: Vaginal itching No date: Vaginal Pap smear, abnormal   Reproductive/Obstetrics                              Anesthesia Physical Anesthesia Plan  ASA: II  Anesthesia Plan: General   Post-op Pain Management:    Induction: Intravenous  PONV Risk Score and Plan: 1 and Propofol infusion  Airway Management Planned: Natural Airway  Additional Equipment:   Intra-op Plan:   Post-operative Plan:   Informed Consent: I have reviewed the patients History and Physical, chart, labs and discussed the procedure including the risks, benefits and alternatives for the proposed anesthesia with the patient or authorized representative who has indicated his/her understanding and acceptance.     Dental advisory given  Plan Discussed with: CRNA and Anesthesiologist  Anesthesia Plan Comments:         Anesthesia Quick Evaluation

## 2020-02-12 NOTE — Transfer of Care (Signed)
Immediate Anesthesia Transfer of Care Note  Patient: Julia Osborne  Procedure(s) Performed: ENTEROSCOPY (N/A )  Patient Location: Endoscopy Unit  Anesthesia Type:General  Level of Consciousness: drowsy  Airway & Oxygen Therapy: Patient Spontanous Breathing  Post-op Assessment: Report given to RN and Post -op Vital signs reviewed and stable  Post vital signs: Reviewed and stable  Last Vitals:  Vitals Value Taken Time  BP 107/69 02/12/20 1153  Temp 36.7 C 02/12/20 1153  Pulse 66 02/12/20 1154  Resp 17 02/12/20 1154  SpO2 99 % 02/12/20 1154  Vitals shown include unvalidated device data.  Last Pain:  Vitals:   02/12/20 1153  TempSrc: Temporal  PainSc: Asleep         Complications: No apparent anesthesia complications

## 2020-02-13 LAB — SURGICAL PATHOLOGY

## 2020-02-14 ENCOUNTER — Encounter: Payer: Self-pay | Admitting: *Deleted

## 2020-02-20 ENCOUNTER — Telehealth: Payer: Self-pay

## 2020-02-20 NOTE — Telephone Encounter (Signed)
Spoke with pt and informed her of biopsy results.

## 2020-02-20 NOTE — Telephone Encounter (Signed)
-----   Message from Jonathon Bellows, MD sent at 02/20/2020  9:53 AM EDT ----- Inform biopsies of the polyps showed that they are benign related to inflammation and not true polyps.

## 2020-04-24 ENCOUNTER — Inpatient Hospital Stay: Payer: Medicare Other | Admitting: Internal Medicine

## 2020-04-24 ENCOUNTER — Inpatient Hospital Stay: Payer: Medicare Other

## 2020-04-24 ENCOUNTER — Telehealth: Payer: Self-pay | Admitting: *Deleted

## 2020-04-24 NOTE — Progress Notes (Deleted)
Gynecologic Oncology Interval Visit   Referring Provider:  Dr. Rogue Bussing  Chief Concern: Endometrial cancer  Subjective:  Julia Osborne is a 59 y.o. female who is seen in consultation from Dr. Rogue Bussing for endometrial cancer surveillance for stage IV disease.  CT C/A/P 10/20/19 1. No evidence of metastatic disease in the chest, abdomen or pelvis. 2. Signs of right hemicolectomy and hysterectomy with bilateral salpingo-oophorectomy.  She has no vaginal bleeding, but does complain of vaginal dryness and irritation. Replens has helped in the past but she can not afford this medication. Dr. Vicente Males manages all her Lynch cancer screening per her report. Her two children have been tested for the mutation and they are not carriers.    Gynecologic Oncology History  Julia Osborne is a pleasant female G20P2 with history of stage IV endometrial cancer. Her history is as follows:    She has Lynch Syndrome with MSH6 mutation and developed uterine cancer, endometrioid grade 2; stage IV with lung nodules in 2015.  Had surgery TAH/BSO, nodes and vaginal bx 11/15 with Dr Hessie Dibble at [facility] in [place].  Then got six cycles of carboplatin/taxol at [facility] which she believed were completed by 02/2015.  CT abdomen pelvis April 2018 NED.  She has a history of high-grade polyp/status post colectomy: given the history of Lynch syndrome-patient needs intensive surveillance colonoscopies; last colonoscopy-May 2018.    She sees  Dr Rogue Bussing for surveillance of endometrial cancer; Dr. Vicente Males in [place] NP orders her mammograms.   Problem List: Patient Active Problem List   Diagnosis Date Noted  . History of endometrial cancer 06/21/2019  . Diverticulitis 06/21/2019  . COPD (chronic obstructive pulmonary disease) ([place]) 06/21/2019  . Asthma 06/21/2019  . Uterine fibroid 06/21/2019  . Anxiety disorder, unspecified 06/21/2019  . Anemia 06/21/2019  . Abdominal pain 12/21/2016  . Lynch  syndrome 10/31/2015  . Genetic testing 10/21/2015  . Abdominal pain, left upper quadrant 10/17/2015  . Family history of cancer   . Family history of kidney cancer   . Adenocarcinoma of colon ([place]) 09/13/2015  . Colon neoplasm 08/30/2015  . Vaginal itching 07/16/2015  . History of colonic polyps   . 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 drug ([place]) 06/28/2015  . Constipation 06/05/2015  . Abnormal CT scan, sigmoid colon 06/05/2015  . Vaginal dryness 05/16/2015  . Dyspareunia 05/16/2015  . Mucosal abnormality of stomach   . Reflux esophagitis   . Dysphagia   . Hematochezia   . Diverticulosis of colon without hemorrhage   . Rectal bleeding 02/14/2015  . GERD (gastroesophageal reflux disease) 02/14/2015  . Esophageal dysphagia 02/14/2015  . Abdominal pain, epigastric 02/14/2015  . Nausea with vomiting 02/14/2015  . Endometrial cancer ([place]) 10/09/2014  . Conversion reaction   . Depression   . Aphasia 09/11/2014    Past Medical History: Past Medical History:  Diagnosis Date  . Adenocarcinoma of colon ([place]) 09/13/2015   Partial colon resection and chemo tx's.   . Anemia   . Anxiety   . Asthma   . Cancer ([place])    endometrial; cancer cells in intestine  . COPD (chronic obstructive pulmonary disease) ([place])    no definite diagnosis  . Depression   . Diverticulitis   . Dyspareunia 05/16/2015  . Family history of cancer   . Family history of kidney cancer   . GERD (gastroesophageal reflux disease)   . Glaucoma   . Indigestion   . Lynch syndrome   . Neuropathy  feet and hands  . Uterine fibroid   . Vaginal dryness 05/16/2015  . Vaginal itching 07/16/2015  . Vaginal Pap smear, abnormal     Past Surgical History: Past Surgical History:  Procedure Laterality Date  . ABDOMINAL HYSTERECTOMY    . APPENDECTOMY    . BIOPSY N/A 03/14/2015   Procedure: BIOPSY;  Surgeon: Daneil Dolin, MD;  Location: [facility] ORS;  Service: Endoscopy;  Laterality: N/A;  Gastric  . COLONOSCOPY N/A  01/28/2017   Procedure: COLONOSCOPY;  Surgeon: Daneil Dolin, MD;  Location: [facility] END[facility] SU[facility];  Service: Endoscopy;  Laterality: N/A;  11:30am  . COLONOSCOPY WITH PROPOFOL N/A 03/14/2015   RMR: Internal hemorrhoids. colonic diverticulosis. Incomplete examination. Prepartation inadequate.  . COLONOSCOPY WITH PROPOFOL N/A 07/04/2015   RMR: Colonic diverticulosis . Large polypoid lesion in the vicinity of the hepatic flexure status post saline-assisted piecmeal snare polypectomy  with ablation and tattooing as described. Sigmoid polyp removed as described above. sigmoid colon polyp hyperplastic, hepatic flexure polyp with TA with focal high grade dysplasia   . COLONOSCOPY WITH PROPOFOL N/A 11/03/2018   Procedure: COLONOSCOPY WITH PROPOFOL;  Surgeon: Jonathon Bellows, MD;  Location: [facility] ENDOSCOPY;  Service: Gastroenterology;  Laterality: N/A;  . ENTEROSCOPY N/A 02/12/2020   Procedure: ENTEROSCOPY;  Surgeon: Jonathon Bellows, MD;  Location: [facility] ENDOSCOPY;  Service: Gastroenterology;  Laterality: N/A;  . ESOPHAGEAL DILATION N/A 03/14/2015   Procedure: ESOPHAGEAL DILATION;  Surgeon: Daneil Dolin, MD;  Location: [facility];  Service: Endoscopy;  Laterality: N/A;  [place] 106  . ESOPHAGOGASTRODUODENOSCOPY N/A 05/19/2016   Procedure: ESOPHAGOGASTRODUODENOSCOPY (EGD);  Surgeon: Daneil Dolin, MD;  Location[facility];  Service: Endoscopy;  Laterality: N/A;  215  . ESOPHAGOGASTRODUODENOSCOPY N/A 01/28/2017   Procedure: ESOPHAGOGASTRODUODENOSCOPY (EGD);  Surgeon: Daneil Dolin, MD;  [facility];  Service: Endoscopy;  Laterality: N/A;  . ESOPHAGOGASTRODUODENOSCOPY (EGD) WITH PROPOFOL N/A 03/14/2015   RMR: Mild erosive reflux esophagitis status post passage o f a [place] dilator. Abnormal gastric mucosa of uncertain significance as described above. status post biopsy, benign  . ESOPHAGOGASTRODUODENOSCOPY (EGD) WITH PROPOFOL N/A 11/18/2017   Procedure: ESOPHAGOGASTRODUODENOSCOPY (EGD) WITH PROPOFOL;  Surgeon:  Daneil Dolin, MD;  [facility];  Service: Endoscopy;  Laterality: N/A;  12:15pm  . ESOPHAGOGASTRODUODENOSCOPY (EGD) WITH PROPOFOL N/A 11/03/2018   Procedure: ESOPHAGOGASTRODUODENOSCOPY (EGD) WITH PROPOFOL;  Surgeon: Jonathon Bellows, MD;  Location: [facility] ENDOSCOPY;  Service: Gastroenterology;  Laterality: N/A;  . GIVENS CAPSULE STUDY N/A 12/25/2019   Procedure: GIVENS CAPSULE STUDY;  Surgeon: Jonathon Bellows, MD;  Location: [facility] ENDOSCOPY;  Service: Gastroenterology;  Laterality: N/A;  . [name] DILATION N/A 05/19/2016   Procedure: [name] DILATION;  Surgeon: Daneil Dolin, MD;  [facility];  Service: Endoscopy;  Laterality: N/A;  . [name] DILATION N/A 01/28/2017   Procedure: [name] DILATION;  Surgeon: Daneil Dolin, MD;  [facility];  Service: Endoscopy;  Laterality: N/A;  [name] DILATION N/A 11/18/2017   Procedure: [name] DILATION;  Surgeon: Daneil Dolin, MD;  [facility];  Service: Endoscopy;  Laterality: N/A;  . PARTIAL COLECTOMY  08/30/2015   polyp with adenocarcinoma  . POLYPECTOMY N/A 07/04/2015   Procedure: POLYPECTOMY;  Surgeon: Daneil Dolin, MD;  Location: [facility];  Service: Endoscopy;  Laterality: N/A;  . PORTACATH PLACEMENT Right 09/2014  . TUBAL LIGATION       OB History:  OB History  Gravida Para Term Preterm AB Living  2 2  2  SAB TAB Ectopic Multiple Live Births               # Outcome Date GA Lbr Len/2nd Weight Sex Delivery Anes PTL Lv  2 Para           1 Para             Family History: Family History  Problem Relation Age of Onset  . Other Mother        clot that went to heart, deceased age 68ss  . Heart attack Father        age 40s, deceased  . Stroke Father   . Cancer Father        "at death determined he was ate up with cancer"  . Hypertension Sister   . Kidney cancer Sister 47  . Diabetes Brother   . Hypertension Brother   . Endometriosis Daughter   . Heart attack Maternal Grandfather   . Diabetes  Sister   . Brain cancer Paternal Aunt   . Cancer Paternal Uncle        NOS  . Cancer Paternal Uncle        NOS  . Cancer Paternal Uncle        NOS  . Colon cancer Neg Hx     Social History: Social History   Socioeconomic History  . Marital status: Married    Spouse name: Not on file  . Number of children: 2  . Years of education: Not on file  . Highest education level: Not on file  Occupational History  . Not on file  Tobacco Use  . Smoking status: Current Every Day Smoker    Packs/day: 2.00    Years: 25.00    Pack years: 50.00    Types: Cigarettes  . Smokeless tobacco: Never Used  . Tobacco comment: Is trying to quit/wean off.  Vaping Use  . Vaping Use: Never used  Substance and Sexual Activity  . Alcohol use: No  . Drug use: No  . Sexual activity: Not Currently    Birth control/protection: Surgical    Comment: hyst  Other Topics Concern  . Not on file  Social History Narrative   #  [place]ives [place]; smoking- 1/2 ppd; no alcohol; used to run machines;       FHx-Dad- MI; ? Cancer on autopsy; sisters/aunts- brain; lung cancer x2; oldest sister- kidney cancer      Pt has 80 year old son;daughter 94 years. Brothers- states to have spoken to her family re: importance of them being checked for lynch.    Social Determinants of Health   Financial Resource Strain:   . Difficulty of Paying Living Expenses:   Food Insecurity:   . Worried About Charity fundraiser in the Last Year:   . Arboriculturist in the Last Year:   Transportation Needs:   . Film/video editor (Medical):   Marland Kitchen Lack of Transportation (Non-Medical):   Physical Activity:   . Days of Exercise per Week:   . Minutes of Exercise per Session:   Stress:   . Feeling of Stress :   Social Connections:   . Frequency of Communication with Friends and Family:   . Frequency of Social Gatherings with Friends and Family:   . Attends Religious Services:   . Active Member of Clubs or Organizations:   .  Attends Archivist Meetings:   Marland Kitchen Marital Status:   Intimate Partner Violence:   . Fear  of Current or Ex-Partner:   . Emotionally Abused:   Marland Kitchen Physically Abused:   . Sexually Abused:     Allergies: Allergies  Allergen Reactions  . Codeine Rash    Current Medications: Current Outpatient Medications  Medication Sig Dispense Refill  . albuterol (PROVENTIL HFA;VENTOLIN HFA) 108 (90 Base) MCG/ACT inhaler Inhale 2 puffs into the lungs every 2 (two) hours as needed for wheezing or shortness of breath (cough). 1 Inhaler 3  . albuterol (PROVENTIL) (2.5 MG/3ML) 0.083% nebulizer solution Take 3 mLs (2.5 mg total) by nebulization every 6 (six) hours as needed for wheezing or shortness of breath. 75 mL 12  . ALPRAZolam (XANAX) 0.25 MG tablet Take 0.125 mg by mouth daily as needed for anxiety.     Marland Kitchen buPROPion (WELLBUTRIN SR) 150 MG 12 hr tablet Take 1 tablet by mouth daily.    . citalopram (CELEXA) 40 MG tablet Take 40 mg by mouth at bedtime.  2  . DEXILANT 60 MG capsule TAKE 1 CAPSULE BY MOUTH EVERY DAY 90 capsule 3  . dicyclomine (BENTYL) 10 MG capsule Take 1 capsule (10 mg total) by mouth 4 (four) times daily -  before meals and at bedtime. 120 capsule 2  . gabapentin (NEURONTIN) 300 MG capsule Take 300 mg by mouth 2 (two) times daily.    Marland Kitchen loratadine (CLARITIN) 10 MG tablet Take 10 mg by mouth daily as needed for allergies.     Marland Kitchen lubiprostone (AMITIZA) 8 MCG capsule Take 1 capsule (8 mcg total) by mouth 2 (two) times daily with a meal. Take medication with a meal to avoid nausea. (Patient taking differently: Take 8 mcg by mouth 2 (two) times daily with a meal. Take medication with a meal to avoid nausea. Pt taking daily) 60 capsule 11  . LUMIGAN 0.01 % SOLN Place 1 drop into the right eye at bedtime.    Marland Kitchen LYRICA 75 MG capsule TAKE 1 CAPSULE BY MOUTH EVERYDAY AT BEDTIME (Patient not taking: Reported on 06/22/2019) 30 capsule 2  . Naphazoline-Pheniramine (ALLERGY EYE OP) Place 1-2 drops  into both eyes 2 (two) times daily as needed (ITCHY, WATERY EYES).    . ondansetron (ZOFRAN) 8 MG tablet Take 8 mg by mouth daily as needed (CAR SICKNESS).    Marland Kitchen PAZEO 0.7 % SOLN Place 1 drop into both eyes every morning.    Marland Kitchen SIMBRINZA 1-0.2 % SUSP Apply 1 drop to eye 2 (two) times daily.    . sodium chloride (OCEAN) 0.65 % SOLN nasal spray Place 1 spray into both nostrils as needed for congestion.    . sucralfate (CARAFATE) 1 GM/10ML suspension Take 10 mLs (1 g total) by mouth 4 (four) times daily. 420 mL 1  . traMADol (ULTRAM) 50 MG tablet TAKE 1 TABLET BY MOUTH DAILY AS NEEDED. 30 tablet 0  . traZODone (DESYREL) 50 MG tablet Take 50 mg by mouth at bedtime.     No current facility-administered medications for this visit.   ***Review of Systems General:  no complaints Skin: no complaints Eyes: no complaints HEENT: no complaints Breasts: no complaints Pulmonary: no complaints Cardiac: no complaints Gastrointestinal: no complaints Genitourinary/Sexual: no complaints Ob/Gyn: no complaints Musculoskeletal: no complaints Hematology: no complaints Neurologic/Psych: no complaints   Objective:  Physical Examination:  There were no vitals taken for this visit.   ***GENERAL: Patient is a well appearing female in no acute distress HEENT:  Sclera clear. Anicteric NODES:  Negative axillary, supraclavicular, inguinal lymph node survery LUNGS:  Clear to  auscultation bilaterally.   HEART:  Regular rate and rhythm.  ABDOMEN:  Soft, nontender.  No hernias, incisions well healed. No masses or ascites EXTREMITIES:  No peripheral edema. Atraumatic. No cyanosis SKIN:  Clear with no obvious rashes or skin changes.  NEURO:  Nonfocal. Well oriented.  Appropriate affect.  ***Pelvic: Exam chaperoned by nursing EGBUS: no lesions Cervix: surgically absent Vagina: no lesions, no discharge or bleeding, but atrophy present Uterus:surgically absent Adnexa: no palpable masses Rectovaginal: deferred     Assessment:  TIFFINIE CAILLIER is a 59 y.o. female with Lynch Syndrome and stage IV grade 2 endometrial cancer diagnosed 11/15 s/p TAH/BSO nodes who presented with lung metastases.  NED since 6 cycles of carboplatin/taxol chemotherapy.  MSH6 mutation carrier.   Vaginal/vulvar irritation, with prior response to Replens, suspect atrophy.     She had partial colectomy for high grade polyp in past.   Medical co-morbidities complicating care:  COPD, prior surgeries Plan:   Problem List Items Addressed This Visit    None     She will continue to follow with her other providers. Dr. Vicente Males is managing surveillance for other Lynch associated cancers. We do need to confirm this and ensure she is undergoing recommended screening. NCCN guidelines are becoming more extensive and vary based on mutation type.    We will try to obtain Replens for her. If she has symptoms despite this intervention consider colposcopy of the vulva.   We can see her back for follow up in 6 months or sooner if any symptoms arise.     The patient's diagnosis, an outline of the further diagnostic and laboratory studies which will be required, the recommendation, and alternatives were discussed.  All questions were answered to the patient's satisfaction.  A total of 30 minutes were spent with the patient/family today; >50% was spent in education, counseling and coordination of care for endometrial cancer and Lynch syndrome.   NCCN Guidelines for MSH6 v1.2020       Verlon Au, NP   ADDENDUM: Discussed with Dr. Rogue Bussing. He will manage her screening for Lynch syndrome associated other cancers.  Verlon Au, NP

## 2020-04-25 ENCOUNTER — Encounter: Payer: Self-pay | Admitting: Obstetrics and Gynecology

## 2020-04-25 ENCOUNTER — Telehealth: Payer: Self-pay | Admitting: Obstetrics and Gynecology

## 2020-04-25 NOTE — Telephone Encounter (Signed)
Patient did not attend appt on 04-24-20. Writer phoned patient on this date and left voicemail to reschedule appt. Writer sent letter to patient on this date requesting patient phone [place] to reschedule.

## 2020-04-30 ENCOUNTER — Inpatient Hospital Stay: Payer: Medicare Other | Attending: Internal Medicine

## 2020-04-30 ENCOUNTER — Encounter: Payer: Self-pay | Admitting: Internal Medicine

## 2020-04-30 ENCOUNTER — Inpatient Hospital Stay (HOSPITAL_BASED_OUTPATIENT_CLINIC_OR_DEPARTMENT_OTHER): Payer: Medicare Other | Admitting: Internal Medicine

## 2020-04-30 ENCOUNTER — Other Ambulatory Visit: Payer: Self-pay

## 2020-04-30 DIAGNOSIS — Z809 Family history of malignant neoplasm, unspecified: Secondary | ICD-10-CM | POA: Diagnosis not present

## 2020-04-30 DIAGNOSIS — Z8051 Family history of malignant neoplasm of kidney: Secondary | ICD-10-CM | POA: Insufficient documentation

## 2020-04-30 DIAGNOSIS — G629 Polyneuropathy, unspecified: Secondary | ICD-10-CM | POA: Diagnosis not present

## 2020-04-30 DIAGNOSIS — F17210 Nicotine dependence, cigarettes, uncomplicated: Secondary | ICD-10-CM | POA: Insufficient documentation

## 2020-04-30 DIAGNOSIS — Z8542 Personal history of malignant neoplasm of other parts of uterus: Secondary | ICD-10-CM | POA: Insufficient documentation

## 2020-04-30 DIAGNOSIS — Z90710 Acquired absence of both cervix and uterus: Secondary | ICD-10-CM | POA: Diagnosis not present

## 2020-04-30 DIAGNOSIS — Z85038 Personal history of other malignant neoplasm of large intestine: Secondary | ICD-10-CM | POA: Insufficient documentation

## 2020-04-30 DIAGNOSIS — K5900 Constipation, unspecified: Secondary | ICD-10-CM | POA: Insufficient documentation

## 2020-04-30 DIAGNOSIS — C541 Malignant neoplasm of endometrium: Secondary | ICD-10-CM

## 2020-04-30 DIAGNOSIS — Z90722 Acquired absence of ovaries, bilateral: Secondary | ICD-10-CM | POA: Insufficient documentation

## 2020-04-30 DIAGNOSIS — Z9079 Acquired absence of other genital organ(s): Secondary | ICD-10-CM | POA: Insufficient documentation

## 2020-04-30 DIAGNOSIS — Z9049 Acquired absence of other specified parts of digestive tract: Secondary | ICD-10-CM | POA: Insufficient documentation

## 2020-04-30 LAB — COMPREHENSIVE METABOLIC PANEL
ALT: 24 U/L (ref 0–44)
AST: 23 U/L (ref 15–41)
Albumin: 3.7 g/dL (ref 3.5–5.0)
Alkaline Phosphatase: 58 U/L (ref 38–126)
Anion gap: 10 (ref 5–15)
BUN: 11 mg/dL (ref 6–20)
CO2: 26 mmol/L (ref 22–32)
Calcium: 8.5 mg/dL — ABNORMAL LOW (ref 8.9–10.3)
Chloride: 103 mmol/L (ref 98–111)
Creatinine, Ser: 0.88 mg/dL (ref 0.44–1.00)
GFR calc Af Amer: >60 mL/min (ref >60)
GFR calc non Af Amer: >60 mL/min (ref >60)
Glucose, Bld: 110 mg/dL — ABNORMAL HIGH (ref 70–99)
Potassium: 3.8 mmol/L (ref 3.5–5.1)
Sodium: 139 mmol/L (ref 135–145)
Total Bilirubin: 0.2 mg/dL — ABNORMAL LOW (ref 0.3–1.2)
Total Protein: 7 g/dL (ref 6.5–8.1)

## 2020-04-30 LAB — CBC WITH DIFFERENTIAL/PLATELET
Abs Immature Granulocytes: 0.03 10*3/uL (ref 0.00–0.07)
Basophils Absolute: 0.1 10*3/uL (ref 0.0–0.1)
Basophils Relative: 1 %
Eosinophils Absolute: 0.3 10*3/uL (ref 0.0–0.5)
Eosinophils Relative: 3 %
HCT: 37.4 % (ref 36.0–46.0)
Hemoglobin: 12.9 g/dL (ref 12.0–15.0)
Immature Granulocytes: 0 %
Lymphocytes Relative: 29 %
Lymphs Abs: 3.1 10*3/uL (ref 0.7–4.0)
MCH: 30.5 pg (ref 26.0–34.0)
MCHC: 34.5 g/dL (ref 30.0–36.0)
MCV: 88.4 fL (ref 80.0–100.0)
Monocytes Absolute: 0.7 10*3/uL (ref 0.1–1.0)
Monocytes Relative: 7 %
Neutro Abs: 6.6 10*3/uL (ref 1.7–7.7)
Neutrophils Relative %: 60 %
Platelets: 267 10*3/uL (ref 150–400)
RBC: 4.23 MIL/uL (ref 3.87–5.11)
RDW: 13.5 % (ref 11.5–15.5)
WBC: 10.8 10*3/uL — ABNORMAL HIGH (ref 4.0–10.5)
nRBC: 0 % (ref 0.0–0.2)

## 2020-04-30 MED ORDER — SODIUM CHLORIDE 0.9% FLUSH
10.0000 mL | Freq: Once | INTRAVENOUS | Status: AC
  Administered 2020-04-30: 10 mL via INTRAVENOUS
  Filled 2020-04-30: qty 10

## 2020-04-30 MED ORDER — HEPARIN SOD (PORK) LOCK FLUSH 100 UNIT/ML IV SOLN
500.0000 [IU] | Freq: Once | INTRAVENOUS | Status: AC
  Administered 2020-04-30: 500 [IU] via INTRAVENOUS
  Filled 2020-04-30: qty 5

## 2020-04-30 NOTE — Progress Notes (Signed)
[facility] NOTE  Patient Care Team: Philmore Pali, NP as PCP - General (Nurse Practitioner) Patrici Ranks, MD (Inactive) as PCP - Hematology/Oncology (Hematology and Oncology) Daneil Dolin, MD as Consulting Physician (Gastroenterology)  CHIEF COMPLAINTS/PURPOSE OF CONSULTATION:  Endometrial cancer  #  Oncology History Overview Note  # OCT 2015- Uterine ca- STAGE IV [multiple lung nodules]; Nov 2015S/p radical hysterectomy, BSO, pelvic lymph node dissection/vaginal biopsies/extensive pelvic disease including positive lymph nodes and positive distal vaginal biopsy.]; [[facility]]-endometroid G-2; LVI; positive Pelvic LN/vaginal Bx Jan-April 2016- carbo-Taxol x6 cycles Forestine Na; Dr.Penland]  # Oct 2016- colo- high grade dysplasia [Dr.Rourk; Dr.Jenkins s/p right hemi-colectomy]; last colo- may 2018;    # Genetic testing- [MSH6 mutation]/Lynch syndrome; EGD/colo every 2-3 qyears; urin cytology q6M  Histologic grade G2, histologic type endometrioid adenocarcinoma -----------------------------------------------------  DIAGNOSIS: Endometrioid endometrial cancer  STAGE: IV  ;GOALS: Control/palliative  CURRENT/MOST RECENT THERAPY surveillance    Endometrial cancer ([place])  07/12/2014 Imaging   CT C/A/P, pelvic adenopathy, multiple pulmonary nodules concerning for metastatic disease   07/30/2014 Initial Diagnosis   Endometrial cancer   07/31/2014 Pathology Results   endometrioid adenocarcinoma, G2, lymphovascular invasion present, positive pelvic lymph node and vaginal biopsy   07/31/2014 Definitive Surgery   radical hysterectomy, BSO, pelvic lymph node dissection and vaginal biopsies   09/25/2014 Procedure   Port-A-Cath placement in IR   10/18/2014 - 01/10/2015 Chemotherapy   Carboplatin/Taxol. First cycle given at [facility]  S/P 6 cycles total, 5 cycles given at [facility].   07/04/2015 Procedure   Colonoscopy by Dr. Gala Romney   07/04/2015 Pathology Results   Colon,  polyp(s), vicinity of hepatic flexure - TUBULAR ADENOMA WITH FOCAL HIGH GRADE DYSPLASIA.    Procedure   Partial colectomy by Dr. Arnoldo Morale scheduled for 08/30/2015   10/31/2015 Genetic Testing   [facility] SYNDROME MSH6 Mutation   04/27/2016 Imaging   CT CAP- No acute process or evidence of metastatic disease in the chest. Right middle lobe pulmonary nodule is unchanged back to 11/20/2014, favoring a benign etiology   11/10/2016 Imaging   CT CAP- 1. No evidence of metastatic disease in the chest, abdomen or pelvis. 2. No evidence of local tumor recurrence at the ileocolic anastomosis in the right abdomen or in the pelvis. Decreased mild fat stranding at the base of the right mesentery, most consistent with postsurgical scarring. 3. Aortic atherosclerosis.   Adenocarcinoma of colon ([place])  07/04/2015 Pathology Results   Diagnosis 1. Colon, polyp(s), vicinity of hepatic flexure - TUBULAR ADENOMA WITH FOCAL HIGH GRADE DYSPLASIA. - NO INVASIVE CARCINOMA. 2. Colon, polyp(s), sigmoid - HYPERPLASTIC POLYP. - NO DYSPLASIA OR MALIGNANCY.   07/04/2015 Procedure   Colonoscopoy by Dr. Gala Romney- large polypoid colonic lesion.   08/30/2015 Definitive Surgery   Dr. Arnoldo Morale- right segmental colon resection   08/30/2015 Pathology Results   [id number] intramucosal adenocarcinoma of colon, 0.2 cm inding the laminal propria with negative resection margins and 0/17 lymph nodes.      HISTORY OF PRESENTING ILLNESS:  Julia Osborne 59 y.o.  female history of endometrial cancer stage IV/Lynch syndrome-current on surveillance is here for follow-up.  In the interim patient had upper endoscopy with GI-had duodenal polyps resected.  Patient denies any worsening shortness of breath or cough.  Appetite is good.  No weight loss.  Denies abdominal pain.  Denies any nausea vomiting.  Unfortunately continues smoke.  No blood in stool or black or stools.  Review of Systems  Constitutional: Positive for malaise/fatigue. Negative  for chills, diaphoresis, fever and weight loss.  HENT: Negative for sore throat.   Eyes: Negative for double vision.  Respiratory: Negative for cough, hemoptysis, sputum production, shortness of breath and wheezing.   Cardiovascular: Negative for chest pain, palpitations, orthopnea and leg swelling.  Gastrointestinal: Negative for blood in stool, constipation, diarrhea, heartburn, melena, nausea and vomiting.  Genitourinary: Negative for dysuria, frequency and urgency.  Musculoskeletal: Positive for back pain and joint pain.  Skin: Negative.  Negative for itching and rash.  Neurological: Positive for tingling. Negative for dizziness, focal weakness, weakness and headaches.  Endo/Heme/Allergies: Does not bruise/bleed easily.  Psychiatric/Behavioral: Negative for depression. The patient is not nervous/anxious and does not have insomnia.      MEDICAL HISTORY:  Past Medical History:  Diagnosis Date  . Adenocarcinoma of colon ([place]) 09/13/2015   Partial colon resection and chemo tx's.   . Anemia   . Anxiety   . Asthma   . Cancer ([place])    endometrial; cancer cells in intestine  . COPD (chronic obstructive pulmonary disease) ([place])    no definite diagnosis  . Depression   . Diverticulitis   . Dyspareunia 05/16/2015  . Family history of cancer   . Family history of kidney cancer   . GERD (gastroesophageal reflux disease)   . Glaucoma   . Indigestion   . Lynch syndrome   . Neuropathy    feet and hands  . Uterine fibroid   . Vaginal dryness 05/16/2015  . Vaginal itching 07/16/2015  . Vaginal Pap smear, abnormal     SURGICAL HISTORY: Past Surgical History:  Procedure Laterality Date  . ABDOMINAL HYSTERECTOMY    . APPENDECTOMY    . BIOPSY N/A 03/14/2015   Procedure: BIOPSY;  Surgeon: Daneil Dolin, MD;  Location: [facility] OR[facility];  Service: Endoscopy;  Laterality: N/A;  Gastric  . COLONOSCOPY N/A 01/28/2017   Procedure: COLONOSCOPY;  Surgeon: Daneil Dolin, MD;  Location: [facility] SU[facility];   Service: Endoscopy;  Laterality: N/A;  11:30am  . COLONOSCOPY WITH PROPOFOL N/A 03/14/2015   RMR: Internal hemorrhoids. colonic diverticulosis. Incomplete examination. Prepartation inadequate.  . COLONOSCOPY WITH PROPOFOL N/A 07/04/2015   RMR: Colonic diverticulosis . Large polypoid lesion in the vicinity of the hepatic flexure status post saline-assisted piecmeal snare polypectomy  with ablation and tattooing as described. Sigmoid polyp removed as described above. sigmoid colon polyp hyperplastic, hepatic flexure polyp with TA with focal high grade dysplasia   . COLONOSCOPY WITH PROPOFOL N/A 11/03/2018   Procedure: COLONOSCOPY WITH PROPOFOL;  Surgeon: Jonathon Bellows, MD;  Location[facility] ENDOSCOPY;  Service: Gastroenterology;  Laterality: N/A;  . ENTEROSCOPY N/A 02/12/2020   Procedure: ENTEROSCOPY;  Surgeon: Jonathon Bellows, MD;  Location: [facility] ENDOSCOPY;  Service: Gastroenterology;  Laterality: N/A;  . ESOPHAGEAL DILATION N/A 03/14/2015   Procedure: ESOPHAGEAL DILATION;  Surgeon: Daneil Dolin, MD;  Location: [facility];  Service: Endoscopy;  Laterality: N/A;  Maloney 25  . ESOPHAGOGASTRODUODENOSCOPY N/A 05/19/2016   Procedure: ESOPHAGOGASTRODUODENOSCOPY (EGD);  Surgeon: Daneil Dolin, MD;  [facility];  Service: Endoscopy;  Laterality: N/A;  215  . ESOPHAGOGASTRODUODENOSCOPY N/A 01/28/2017   Procedure: ESOPHAGOGASTRODUODENOSCOPY (EGD);  Surgeon: Daneil Dolin, MD;  [facility];  Service: Endoscopy;  Laterality: N/A;  . ESOPHAGOGASTRODUODENOSCOPY (EGD) WITH PROPOFOL N/A 03/14/2015   RMR: Mild erosive reflux esophagitis status post passage o f a Maloney dilator. Abnormal gastric mucosa of uncertain significance as described above. status post biopsy, benign  . ESOPHAGOGASTRODUODENOSCOPY (EGD) WITH PROPOFOL N/A  11/18/2017   Procedure: ESOPHAGOGASTRODUODENOSCOPY (EGD) WITH PROPOFOL;  Surgeon: Daneil Dolin, MD;  [facility];  Service: Endoscopy;  Laterality: N/A;  12:15pm  .  ESOPHAGOGASTRODUODENOSCOPY (EGD) WITH PROPOFOL N/A 11/03/2018   Procedure: ESOPHAGOGASTRODUODENOSCOPY (EGD) WITH PROPOFOL;  Surgeon: Jonathon Bellows, MD;  Location: [facility] ENDOSCOPY;  Service: Gastroenterology;  Laterality: N/A;  . GIVENS CAPSULE STUDY N/A 12/25/2019   Procedure: GIVENS CAPSULE STUDY;  Surgeon: Jonathon Bellows, MD;  Location: [facility] ENDOSCOPY;  Service: Gastroenterology;  Laterality: N/A;  . [name] DILATION N/A 05/19/2016   Procedure: [name] DILATION;  Surgeon: Daneil Dolin, MD;  [facility];  Service: Endoscopy;  Laterality: N/A;  . [name] DILATION N/A 01/28/2017   Procedure: [name] DILATION;  Surgeon: Daneil Dolin, MD;  [facility];  Service: Endoscopy;  Laterality: N/A;  [name] DILATION N/A 11/18/2017   Procedure: [name] DILATION;  Surgeon: Daneil Dolin, MD;  [facility];  Service: Endoscopy;  Laterality: N/A;  . PARTIAL COLECTOMY  08/30/2015   polyp with adenocarcinoma  . POLYPECTOMY N/A 07/04/2015   Procedure: POLYPECTOMY;  Surgeon: Daneil Dolin, MD;  Location: [facility];  Service: Endoscopy;  Laterality: N/A;  . PORTACATH PLACEMENT Right 09/2014  . TUBAL LIGATION      SOCIAL HISTORY: Social History   Socioeconomic History  . Marital status: Married    Spouse name: Not on file  . Number of children: 2  . Years of education: Not on file  . Highest education level: Not on file  Occupational History  . Not on file  Tobacco Use  . Smoking status: Current Every Day Smoker    Packs/day: 2.00    Years: 25.00    Pack years: 50.00    Types: Cigarettes  . Smokeless tobacco: Never Used  . Tobacco comment: Is trying to quit/wean off.  Vaping Use  . Vaping Use: Never used  Substance and Sexual Activity  . Alcohol use: No  . Drug use: No  . Sexual activity: Not Currently    Birth control/protection: Surgical    Comment: hyst  Other Topics Concern  . Not on file  Social History Narrative   #  [place]ives [place]; smoking- 1/2  ppd; no alcohol; used to run machines;       FHx-Dad- MI; ? Cancer on autopsy; sisters/aunts- brain; lung cancer x2; oldest sister- kidney cancer      Pt has 57 year old son;daughter 14 years. Brothers- states to have spoken to her family re: importance of them being checked for lynch.    Social Determinants of Health   Financial Resource Strain:   . Difficulty of Paying Living Expenses:   Food Insecurity:   . Worried About Charity fundraiser in the Last Year:   . Arboriculturist in the Last Year:   Transportation Needs:   . Film/video editor (Medical):   Marland Kitchen Lack of Transportation (Non-Medical):   Physical Activity:   . Days of Exercise per Week:   . Minutes of Exercise per Session:   Stress:   . Feeling of Stress :   Social Connections:   . Frequency of Communication with Friends and Family:   . Frequency of Social Gatherings with Friends and Family:   . Attends Religious Services:   . Active Member of Clubs or Organizations:   . Attends Archivist Meetings:   Marland Kitchen Marital Status:   Intimate Partner Violence:   . Fear of Current or Ex-Partner:   .  Emotionally Abused:   Marland Kitchen Physically Abused:   . Sexually Abused:     FAMILY HISTORY:  Family History  Problem Relation Age of Onset  . Other Mother        clot that went to heart, deceased age 56ss  . Heart attack Father        age 56s, deceased  . Stroke Father   . Cancer Father        "at death determined he was ate up with cancer"  . Hypertension Sister   . Kidney cancer Sister 72  . Diabetes Brother   . Hypertension Brother   . Endometriosis Daughter   . Heart attack Maternal Grandfather   . Diabetes Sister   . Brain cancer Paternal Aunt   . Cancer Paternal Uncle        NOS  . Cancer Paternal Uncle        NOS  . Cancer Paternal Uncle        NOS  . Colon cancer Neg Hx     ALLERGIES:  is allergic to codeine.  MEDICATIONS:  Current Outpatient Medications  Medication Sig Dispense Refill  .  albuterol (PROVENTIL HFA;VENTOLIN HFA) 108 (90 Base) MCG/ACT inhaler Inhale 2 puffs into the lungs every 2 (two) hours as needed for wheezing or shortness of breath (cough). 1 Inhaler 3  . albuterol (PROVENTIL) (2.5 MG/3ML) 0.083% nebulizer solution Take 3 mLs (2.5 mg total) by nebulization every 6 (six) hours as needed for wheezing or shortness of breath. 75 mL 12  . ALPRAZolam (XANAX) 0.25 MG tablet Take 0.125 mg by mouth daily as needed for anxiety.     Marland Kitchen buPROPion (WELLBUTRIN SR) 150 MG 12 hr tablet Take 1 tablet by mouth daily.    . citalopram (CELEXA) 40 MG tablet Take 40 mg by mouth at bedtime.  2  . DEXILANT 60 MG capsule TAKE 1 CAPSULE BY MOUTH EVERY DAY 90 capsule 3  . dicyclomine (BENTYL) 10 MG capsule Take 1 capsule (10 mg total) by mouth 4 (four) times daily -  before meals and at bedtime. 120 capsule 2  . gabapentin (NEURONTIN) 300 MG capsule Take 300 mg by mouth 2 (two) times daily.    Marland Kitchen loratadine (CLARITIN) 10 MG tablet Take 10 mg by mouth daily as needed for allergies.     Marland Kitchen lubiprostone (AMITIZA) 8 MCG capsule Take 1 capsule (8 mcg total) by mouth 2 (two) times daily with a meal. Take medication with a meal to avoid nausea. (Patient taking differently: Take 8 mcg by mouth 2 (two) times daily with a meal. Take medication with a meal to avoid nausea. Pt taking daily) 60 capsule 11  . LUMIGAN 0.01 % SOLN Place 1 drop into the right eye at bedtime.    Mable Fill (ALLERGY EYE OP) Place 1-2 drops into both eyes 2 (two) times daily as needed (ITCHY, WATERY EYES).    . ondansetron (ZOFRAN) 8 MG tablet Take 8 mg by mouth daily as needed (CAR SICKNESS).    Marland Kitchen PAZEO 0.7 % SOLN Place 1 drop into both eyes every morning.    Marland Kitchen SIMBRINZA 1-0.2 % SUSP Apply 1 drop to eye 2 (two) times daily.    . sodium chloride (OCEAN) 0.65 % SOLN nasal spray Place 1 spray into both nostrils as needed for congestion.    . sucralfate (CARAFATE) 1 GM/10ML suspension Take 10 mLs (1 g total) by mouth 4  (four) times daily. 420 mL 1  . traMADol (ULTRAM)  50 MG tablet TAKE 1 TABLET BY MOUTH DAILY AS NEEDED. 30 tablet 0  . traZODone (DESYREL) 50 MG tablet Take 50 mg by mouth at bedtime.    Marland Kitchen LYRICA 75 MG capsule TAKE 1 CAPSULE BY MOUTH EVERYDAY AT BEDTIME (Patient not taking: Reported on 06/22/2019) 30 capsule 2   No current facility-administered medications for this visit.      Marland Kitchen  PHYSICAL EXAMINATION: ECOG PERFORMANCE STATUS: 0 - Asymptomatic  Vitals:   04/30/20 1425  BP: 113/69  Pulse: 62  Resp: 16  Temp: 98.9 F (37.2 C)  SpO2: 97%   Filed Weights   04/30/20 1425  Weight: 177 lb (80.3 kg)    Physical Exam Constitutional:      Comments: Patient is alone.  HENT:     Head: Normocephalic and atraumatic.     Mouth/Throat:     Pharynx: No oropharyngeal exudate.  Eyes:     Pupils: Pupils are equal, round, and reactive to light.  Cardiovascular:     Rate and Rhythm: Normal rate and regular rhythm.  Pulmonary:     Effort: No respiratory distress.     Breath sounds: No wheezing.  Abdominal:     General: Bowel sounds are normal. There is no distension.     Palpations: Abdomen is soft. There is no mass.     Tenderness: There is no abdominal tenderness. There is no guarding or rebound.  Musculoskeletal:        General: No tenderness. Normal range of motion.     Cervical back: Normal range of motion and neck supple.  Skin:    General: Skin is warm.     Comments:    Neurological:     Mental Status: She is alert and oriented to person, place, and time.  Psychiatric:        Mood and Affect: Affect normal.      LABORATORY DATA:  I have reviewed the data as listed Lab Results  Component Value Date   WBC 10.8 (H) 04/30/2020   HGB 12.9 04/30/2020   HCT 37.4 04/30/2020   MCV 88.4 04/30/2020   PLT 267 04/30/2020   Recent Labs    10/14/19 2153 10/25/19 1002 04/30/20 1401  NA 136 137 139  K 3.5 3.8 3.8  CL 103 104 103  CO2 '27 24 26  ' GLUCOSE 121* 99 110*  BUN '14  12 11  ' CREATININE 0.79 0.79 0.88  CALCIUM 8.6* 8.8* 8.5*  GFRNONAA >60 >60 >60  GFRAA >60 >60 >60  PROT 6.1* 7.0 7.0  ALBUMIN 3.3* 3.8 3.7  AST 15 14* 23  ALT '19 17 24  ' ALKPHOS 51 56 58  BILITOT 0.4 0.4 0.2*    RADIOGRAPHIC STUDIES: I have personally reviewed the radiological images as listed and agreed with the findings in the report. No results found.  ASSESSMENT & PLAN:   Endometrial cancer ([place]) # Uterine Cancer endometrioid type grade 2; stage IV [lung nodules at presentation 2015/]-February 2021- CT scan abdomen pelvis/chest-negative for any obvious recurrence.   STABLE.  No clinical evidence of recurrence.  We will repeat surveillance imaging in 6 months.  Ordered.  # Lynch syndrome Ozzie Hoyle of high-grade polyp/status post colectomy: Last colonoscopy-Feb 2020; repeat in 2 years;  EGD- May 2021--reviewed.  Stable.  Urine cytology pending.   #Active smoker on Wellbutrin-discussed smoking cessation; declines.   #Peripheral neuropathy grade 2-3; STABLE;  continue tramadol as needed.  # Constipation- on amitizia-STABLE.   # # Patient is to unvaccinated to  Covid-19.  Concerned about potential adverse events.  I again educated the patient/ regarding the concerns for extremely high risk of infection/serious complications from the new variants.  Benefits of Covid vaccine outweigh the risk at any time; and strongly recommend COVID-19 vaccination.   DISPOSITION: # port flush every 3 months # follow up in 6 months/labs- cbc/cmp/ca-125/urine cytology; CT scan c/a/p prior---Dr.B       All questions were answered. The patient knows to call the clinic with any problems, questions or concerns.     Cammie Sickle, MD 04/30/2020 2:52 PM

## 2020-04-30 NOTE — Assessment & Plan Note (Addendum)
#   Uterine Cancer endometrioid type grade 2; stage IV [lung nodules at presentation 2015/]-February 2021- CT scan abdomen pelvis/chest-negative for any obvious recurrence.   STABLE.  No clinical evidence of recurrence.  We will repeat surveillance imaging in 6 months.  Ordered.  # Lynch syndrome /history of high-grade polyp/status post colectomy: Last colonoscopy-Feb 2020; repeat in 2 years;  EGD- May 2021--reviewed.  Stable.  Urine cytology pending.   #Active smoker on Wellbutrin-discussed smoking cessation; declines.   #Peripheral neuropathy grade 2-3; STABLE;  continue tramadol as needed.  # Constipation- on amitizia-STABLE.   # # Patient is to unvaccinated to Covid-19.  Concerned about potential adverse events.  I again educated the patient/ regarding the concerns for extremely high risk of infection/serious complications from the new variants.  Benefits of Covid vaccine outweigh the risk at any time; and strongly recommend COVID-19 vaccination.   DISPOSITION: # port flush every 3 months # follow up in 6 months/labs- cbc/cmp/ca-125/urine cytology; CT scan c/a/p prior---Dr.B     

## 2020-05-01 LAB — CA 125: Cancer Antigen (CA) 125: 13.1 U/mL (ref 0.0–38.1)

## 2020-05-18 ENCOUNTER — Other Ambulatory Visit: Payer: Self-pay | Admitting: Nurse Practitioner

## 2020-05-22 DIAGNOSIS — Z20822 Contact with and (suspected) exposure to covid-19: Secondary | ICD-10-CM | POA: Diagnosis not present

## 2020-06-19 ENCOUNTER — Other Ambulatory Visit: Payer: Self-pay

## 2020-06-19 ENCOUNTER — Encounter: Payer: Self-pay | Admitting: Emergency Medicine

## 2020-06-19 ENCOUNTER — Emergency Department
Admission: EM | Admit: 2020-06-19 | Discharge: 2020-06-19 | Disposition: A | Discharge status: Home / Self Care | Payer: Medicare Other | Attending: Emergency Medicine | Admitting: Emergency Medicine

## 2020-06-19 ENCOUNTER — Emergency Department: Payer: Medicare Other

## 2020-06-19 DIAGNOSIS — Z79899 Other long term (current) drug therapy: Secondary | ICD-10-CM | POA: Insufficient documentation

## 2020-06-19 DIAGNOSIS — F17210 Nicotine dependence, cigarettes, uncomplicated: Secondary | ICD-10-CM | POA: Insufficient documentation

## 2020-06-19 DIAGNOSIS — J449 Chronic obstructive pulmonary disease, unspecified: Secondary | ICD-10-CM | POA: Insufficient documentation

## 2020-06-19 DIAGNOSIS — Z8542 Personal history of malignant neoplasm of other parts of uterus: Secondary | ICD-10-CM | POA: Insufficient documentation

## 2020-06-19 DIAGNOSIS — Z85038 Personal history of other malignant neoplasm of large intestine: Secondary | ICD-10-CM | POA: Diagnosis not present

## 2020-06-19 DIAGNOSIS — W540XXA Bitten by dog, initial encounter: Secondary | ICD-10-CM | POA: Insufficient documentation

## 2020-06-19 DIAGNOSIS — S6991XA Unspecified injury of right wrist, hand and finger(s), initial encounter: Secondary | ICD-10-CM | POA: Diagnosis present

## 2020-06-19 DIAGNOSIS — S61411A Laceration without foreign body of right hand, initial encounter: Secondary | ICD-10-CM | POA: Diagnosis not present

## 2020-06-19 DIAGNOSIS — S31821A Laceration without foreign body of left buttock, initial encounter: Secondary | ICD-10-CM | POA: Insufficient documentation

## 2020-06-19 DIAGNOSIS — S51811A Laceration without foreign body of right forearm, initial encounter: Secondary | ICD-10-CM | POA: Diagnosis not present

## 2020-06-19 DIAGNOSIS — Y92090 Kitchen in other non-institutional residence as the place of occurrence of the external cause: Secondary | ICD-10-CM | POA: Diagnosis not present

## 2020-06-19 DIAGNOSIS — S61551A Open bite of right wrist, initial encounter: Secondary | ICD-10-CM | POA: Diagnosis not present

## 2020-06-19 DIAGNOSIS — S51851A Open bite of right forearm, initial encounter: Secondary | ICD-10-CM | POA: Diagnosis not present

## 2020-06-19 DIAGNOSIS — S61511A Laceration without foreign body of right wrist, initial encounter: Secondary | ICD-10-CM | POA: Insufficient documentation

## 2020-06-19 DIAGNOSIS — S31825A Open bite of left buttock, initial encounter: Secondary | ICD-10-CM | POA: Diagnosis not present

## 2020-06-19 MED ORDER — AMOXICILLIN-POT CLAVULANATE 875-125 MG PO TABS
1.0000 | ORAL_TABLET | Freq: Two times a day (BID) | ORAL | 0 refills | Status: DC
Start: 2020-06-19 — End: 2021-03-13

## 2020-06-19 MED ORDER — MELOXICAM 15 MG PO TABS
15.0000 mg | ORAL_TABLET | Freq: Every day | ORAL | 0 refills | Status: DC
Start: 2020-06-19 — End: 2021-03-13

## 2020-06-19 MED ORDER — HYDROCODONE-ACETAMINOPHEN 5-325 MG PO TABS
1.0000 | ORAL_TABLET | Freq: Once | ORAL | Status: AC
  Administered 2020-06-19: 1 via ORAL
  Filled 2020-06-19: qty 1

## 2020-06-19 NOTE — ED Provider Notes (Signed)
[facility] Emergency Department Provider Note  ____________________________________________  Time seen: Approximately 5:57 PM  I have reviewed the triage vital signs and the nursing notes.   HISTORY  Chief Complaint Animal Bite    HPI Julia Osborne is a 59 y.o. female who presents the emergency department complaining of dog bite to the right wrist, left buttocks.  Patient states that her dog has been mentally declining.  Patient states that the dog has had confusion, some aggression.  Patient has been bitten as well as her husband and she had talked to the vet who recommended euthanasia.  There was no concern from the vet that the patient's animal has rabies.  The animal is up-to-date on rabies vaccination.  Patient is up-to-date on her tetanus immunization.  The patient reports that she was in the kitchen, petting the dog when all of a sudden he "snapped" and started biting her.  Patient states that she had to run through the house, walk her self in the bathroom to protect herself.  She is contacted animal control and they are in route to retrieve the animal and euthanize it.         Past Medical History:  Diagnosis Date  . Adenocarcinoma of colon ([place]) 09/13/2015   Partial colon resection and chemo tx's.   . Anemia   . Anxiety   . Asthma   . Cancer ([place])    endometrial; cancer cells in intestine  . COPD (chronic obstructive pulmonary disease) ([place])    no definite diagnosis  . Depression   . Diverticulitis   . Dyspareunia 05/16/2015  . Family history of cancer   . Family history of kidney cancer   . GERD (gastroesophageal reflux disease)   . Glaucoma   . Indigestion   . Lynch syndrome   . Neuropathy    feet and hands  . Uterine fibroid   . Vaginal dryness 05/16/2015  . Vaginal itching 07/16/2015  . Vaginal Pap smear, abnormal     Patient Active Problem List   Diagnosis Date Noted  . History of endometrial cancer 06/21/2019  . Diverticulitis  06/21/2019  . COPD (chronic obstructive pulmonary disease) ([place]) 06/21/2019  . Asthma 06/21/2019  . Uterine fibroid 06/21/2019  . Anxiety disorder, unspecified 06/21/2019  . Anemia 06/21/2019  . Abdominal pain 12/21/2016  . Lynch syndrome 10/31/2015  . Genetic testing 10/21/2015  . Abdominal pain, left upper quadrant 10/17/2015  . Family history of cancer   . Family history of kidney cancer   . Adenocarcinoma of colon ([place]) 09/13/2015  . Colon neoplasm 08/30/2015  . Vaginal itching 07/16/2015  . History of colonic polyps   . 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 drug ([place]) 06/28/2015  . Constipation 06/05/2015  . Abnormal CT scan, sigmoid colon 06/05/2015  . Vaginal dryness 05/16/2015  . Dyspareunia 05/16/2015  . Mucosal abnormality of stomach   . Reflux esophagitis   . Dysphagia   . Hematochezia   . Diverticulosis of colon without hemorrhage   . Rectal bleeding 02/14/2015  . GERD (gastroesophageal reflux disease) 02/14/2015  . Esophageal dysphagia 02/14/2015  . Abdominal pain, epigastric 02/14/2015  . Nausea with vomiting 02/14/2015  . Endometrial cancer ([place]) 10/09/2014  . Conversion reaction   . Depression   . Aphasia 09/11/2014    Past Surgical History:  Procedure Laterality Date  . ABDOMINAL HYSTERECTOMY    . APPENDECTOMY    . BIOPSY N/A 03/14/2015   Procedure: BIOPSY;  Surgeon: Daneil Dolin, MD;  Location:  [facility];  Service: Endoscopy;  Laterality: N/A;  Gastric  . COLONOSCOPY N/A 01/28/2017   Procedure: COLONOSCOPY;  Surgeon: Daneil Dolin, MD;  Location: [facility];  Service: Endoscopy;  Laterality: N/A;  11:30am  . COLONOSCOPY WITH PROPOFOL N/A 03/14/2015   RMR: Internal hemorrhoids. colonic diverticulosis. Incomplete examination. Prepartation inadequate.  . COLONOSCOPY WITH PROPOFOL N/A 07/04/2015   RMR: Colonic diverticulosis . Large polypoid lesion in the vicinity of the hepatic flexure status post saline-assisted piecmeal snare polypectomy  with ablation  and tattooing as described. Sigmoid polyp removed as described above. sigmoid colon polyp hyperplastic, hepatic flexure polyp with TA with focal high grade dysplasia   . COLONOSCOPY WITH PROPOFOL N/A 11/03/2018   Procedure: COLONOSCOPY WITH PROPOFOL;  Surgeon: Jonathon Bellows, MD;  Location: [facility] ENDOSCOPY;  Service: Gastroenterology;  Laterality: N/A;  . ENTEROSCOPY N/A 02/12/2020   Procedure: ENTEROSCOPY;  Surgeon: Jonathon Bellows, MD;  Location: [facility] ENDOSCOPY;  Service: Gastroenterology;  Laterality: N/A;  . ESOPHAGEAL DILATION N/A 03/14/2015   Procedure: ESOPHAGEAL DILATION;  Surgeon: Daneil Dolin, MD;  Location: [facility];  Service: Endoscopy;  Laterality: N/A;  [place] 13  . ESOPHAGOGASTRODUODENOSCOPY N/A 05/19/2016   Procedure: ESOPHAGOGASTRODUODENOSCOPY (EGD);  Surgeon: Daneil Dolin, MD;  [facility];  Service: Endoscopy;  Laterality: N/A;  215  . ESOPHAGOGASTRODUODENOSCOPY N/A 01/28/2017   Procedure: ESOPHAGOGASTRODUODENOSCOPY (EGD);  Surgeon: Daneil Dolin, MD;  [facility];  Service: Endoscopy;  Laterality: N/A;  . ESOPHAGOGASTRODUODENOSCOPY (EGD) WITH PROPOFOL N/A 03/14/2015   RMR: Mild erosive reflux esophagitis status post passage o f a [place] dilator. Abnormal gastric mucosa of uncertain significance as described above. status post biopsy, benign  . ESOPHAGOGASTRODUODENOSCOPY (EGD) WITH PROPOFOL N/A 11/18/2017   Procedure: ESOPHAGOGASTRODUODENOSCOPY (EGD) WITH PROPOFOL;  Surgeon: Daneil Dolin, MD;  [facility];  Service: Endoscopy;  Laterality: N/A;  12:15pm  . ESOPHAGOGASTRODUODENOSCOPY (EGD) WITH PROPOFOL N/A 11/03/2018   Procedure: ESOPHAGOGASTRODUODENOSCOPY (EGD) WITH PROPOFOL;  Surgeon: Jonathon Bellows, MD;  Location: [facility] ENDOSCOPY;  Service: Gastroenterology;  Laterality: N/A;  . GIVENS CAPSULE STUDY N/A 12/25/2019   Procedure: GIVENS CAPSULE STUDY;  Surgeon: Jonathon Bellows, MD;  Location: [facility] ENDOSCOPY;  Service: Gastroenterology;  Laterality: N/A;  .  [name] DILATION N/A 05/19/2016   Procedure: [name] DILATION;  Surgeon: Daneil Dolin, MD;  [facility];  Service: Endoscopy;  Laterality: N/A;  . [name] DILATION N/A 01/28/2017   Procedure: [name] DILATION;  Surgeon: Daneil Dolin, MD;  [facility];  Service: Endoscopy;  Laterality: N/A;  [name] DILATION N/A 11/18/2017   Procedure: [name] DILATION;  Surgeon: Daneil Dolin, MD;  [facility];  Service: Endoscopy;  Laterality: N/A;  . PARTIAL COLECTOMY  08/30/2015   polyp with adenocarcinoma  . POLYPECTOMY N/A 07/04/2015   Procedure: POLYPECTOMY;  Surgeon: Daneil Dolin, MD;  Location: [facility];  Service: Endoscopy;  Laterality: N/A;  . PORTACATH PLACEMENT Right 09/2014  . TUBAL LIGATION      Prior to Admission medications   Medication Sig Start Date End Date Taking? Authorizing Provider  albuterol (PROVENTIL HFA;VENTOLIN HFA) 108 (90 Base) MCG/ACT inhaler Inhale 2 puffs into the lungs every 2 (two) hours as needed for wheezing or shortness of breath (cough). 10/17/15   Annitta Needs, NP  albuterol (PROVENTIL) (2.5 MG/3ML) 0.083% nebulizer solution Take 3 mLs (2.5 mg total) by nebulization every 6 (six) hours as needed for wheezing or shortness of breath. 01/16/16   Penland, Larene Beach  K, MD  ALPRAZolam (XANAX) 0.25 MG tablet Take 0.125 mg by mouth daily as needed for anxiety.     [provider]  amoxicillin-clavulanate (AUGMENTIN) 875-125 MG tablet Take 1 tablet by mouth 2 (two) times daily. 06/19/20   Jonah Gingras, Charline Bills, PA-C  buPROPion (WELLBUTRIN SR) 150 MG 12 hr tablet Take 1 tablet by mouth daily. 07/25/18   [provider]  citalopram (CELEXA) 40 MG tablet Take 40 mg by mouth at bedtime. 01/05/17   [provider]  DEXILANT 60 MG capsule TAKE 1 CAPSULE BY MOUTH EVERY DAY 02/24/19   Carlis Stable, NP  dicyclomine (BENTYL) 10 MG capsule Take 1 capsule (10 mg total) by mouth 4 (four) times daily -  before meals and at bedtime.  01/29/20   Jonathon Bellows, MD  gabapentin (NEURONTIN) 300 MG capsule Take 300 mg by mouth 2 (two) times daily. 03/10/19   [provider]  loratadine (CLARITIN) 10 MG tablet Take 10 mg by mouth daily as needed for allergies.     [provider]  lubiprostone (AMITIZA) 8 MCG capsule Take 1 capsule (8 mcg total) by mouth 2 (two) times daily with a meal. Take medication with a meal to avoid nausea. Patient taking differently: Take 8 mcg by mouth 2 (two) times daily with a meal. Take medication with a meal to avoid nausea. Pt taking daily 01/13/19   Rourk, Cristopher Estimable, MD  LUMIGAN 0.01 % SOLN Place 1 drop into the right eye at bedtime. 06/24/18   [provider]  LYRICA 75 MG capsule TAKE 1 CAPSULE BY MOUTH EVERYDAY AT BEDTIME Patient not taking: Reported on 06/22/2019 10/24/17   Holley Bouche, NP  meloxicam (MOBIC) 15 MG tablet Take 1 tablet (15 mg total) by mouth daily. 06/19/20   Willson Lipa, Charline Bills, PA-C  Naphazoline-Pheniramine (ALLERGY EYE OP) Place 1-2 drops into both eyes 2 (two) times daily as needed (ITCHY, WATERY EYES).    [provider]  ondansetron (ZOFRAN) 8 MG tablet Take 8 mg by mouth daily as needed (CAR SICKNESS).    [provider]  PAZEO 0.7 % SOLN Place 1 drop into both eyes every morning. 06/27/18   [provider]  SIMBRINZA 1-0.2 % SUSP Apply 1 drop to eye 2 (two) times daily. 04/04/19   [provider]  sodium chloride (OCEAN) 0.65 % SOLN nasal spray Place 1 spray into both nostrils as needed for congestion.    [provider]  sucralfate (CARAFATE) 1 GM/10ML suspension Take 10 mLs (1 g total) by mouth 4 (four) times daily. 10/24/18   Jonathon Bellows, MD  traMADol (ULTRAM) 50 MG tablet TAKE 1 TABLET BY MOUTH DAILY AS NEEDED. 06/07/19   Verlon Au, NP  traZODone (DESYREL) 50 MG tablet Take 50 mg by mouth at bedtime.    [provider]    Allergies Codeine  Family History  Problem Relation Age of Onset   . Other Mother        clot that went to heart, deceased age 53ss  . Heart attack Father        age 63s, deceased  . Stroke Father   . Cancer Father        "at death determined he was ate up with cancer"  . Hypertension Sister   . Kidney cancer Sister 84  . Diabetes Brother   . Hypertension Brother   . Endometriosis Daughter   . Heart attack Maternal Grandfather   . Diabetes Sister   .  Brain cancer Paternal Aunt   . Cancer Paternal Uncle        NOS  . Cancer Paternal Uncle        NOS  . Cancer Paternal Uncle        NOS  . Colon cancer Neg Hx     Social History Social History   Tobacco Use  . Smoking status: Current Every Day Smoker    Packs/day: 2.00    Years: 25.00    Pack years: 50.00    Types: Cigarettes  . Smokeless tobacco: Never Used  . Tobacco comment: Is trying to quit/wean off.  Vaping Use  . Vaping Use: Never used  Substance Use Topics  . Alcohol use: No  . Drug use: No     Review of Systems  Constitutional: No fever/chills Eyes: No visual changes. No discharge ENT: No upper respiratory complaints. Cardiovascular: no chest pain. Respiratory: no cough. No SOB. Gastrointestinal: No abdominal pain.  No nausea, no vomiting.  No diarrhea.  No constipation. Musculoskeletal: Dog bite to the right wrist and left hip Skin: Negative for rash, abrasions, lacerations, ecchymosis. Neurological: Negative for headaches, focal weakness or numbness. 10-point ROS otherwise negative.  ____________________________________________   PHYSICAL EXAM:  VITAL SIGNS: ED Triage Vitals  Enc Vitals Group     BP 06/19/20 1704 (!) 143/64     Pulse Rate 06/19/20 1704 79     Resp 06/19/20 1704 18     Temp 06/19/20 1704 99.2 F (37.3 C)     Temp Source 06/19/20 1704 Oral     SpO2 06/19/20 1704 95 %     Weight 06/19/20 1705 175 lb (79.4 kg)     Height 06/19/20 1705 5\' 3"  (1.6 m)     Head Circumference --      Peak Flow --      Pain Score 06/19/20 1705 7     Pain Loc  --      Pain Edu? --      Excl. in [place]? --      Constitutional: Alert and oriented. Well appearing and in no acute distress. Eyes: Conjunctivae are normal. PERRL. EOMI. Head: Atraumatic. ENT:      Ears:       Nose: No congestion/rhinnorhea.      Mouth/Throat: Mucous membranes are moist.  Neck: No stridor.    Cardiovascular: Normal rate, regular rhythm. Normal S1 and S2.  Good peripheral circulation. Respiratory: Normal respiratory effort without tachypnea or retractions. Lungs CTAB. Good air entry to the bases with no decreased or absent breath sounds. Musculoskeletal: Full range of motion to all extremities. No gross deformities appreciated.  Visualization of the right wrist reveals mild edema but no deformity.  Patient has 5 soft tissue wounds consistent with dog bite.  2-hour puncture type wounds.  To our laceration type wounds.  All edges are approximated.  No active bleeding.  No visible foreign body.  Patient is able to extend, flex, supinate and pronate the wrist.  Largest wound to the wrist measures approximately 2 cm in length but is relatively shallow.  Patient has 1 laceration that is approximately 0.5 cm in length that is slightly deeper but again edges are approximated at this time.  Visualization of the left hip reveals soft tissue injury over the left buttocks.  Small puncture wounds consistent with dog bite with 1 excoriation type injury.  No bleeding.  No foreign body. Neurologic:  Normal speech and language. No gross focal neurologic deficits are appreciated.  Skin:  Skin is warm, dry and intact. No rash noted. Psychiatric: Mood and affect are normal. Speech and behavior are normal. Patient exhibits appropriate insight and judgement.   ____________________________________________   LABS (all labs ordered are listed, but only abnormal results are displayed)  Labs Reviewed - No data to  display ____________________________________________  EKG   ____________________________________________  RADIOLOGY I personally viewed and evaluated these images as part of my medical decision making, as well as reviewing the written report by the radiologist.  DG Wrist Complete Right  Result Date: 06/19/2020 CLINICAL DATA:  Dog bite, right wrist EXAM: RIGHT WRIST - COMPLETE 3+ VIEW COMPARISON:  None. FINDINGS: Soft tissue swelling and laceration along the dorsal aspect of the distal forearm, wrist and hand. No retained foreign body. No associated osseous defect or other acute osseous injury. Minimal arthrosis in the wrist. Mild negative ulnar variance. IMPRESSION: Soft tissue swelling and laceration along the dorsal aspect of the distal forearm, wrist and hand. No retained foreign body. No acute osseous abnormality. Electronically Signed   By: Lovena Le M.D.   On: 06/19/2020 19:15    ____________________________________________    PROCEDURES  Procedure(s) performed:    Marland KitchenMarland KitchenLaceration Repair  Date/Time: 06/19/2020 7:33 PM Performed by: Darletta Moll, PA-C Authorized by: Darletta Moll, PA-C   Consent:    Consent obtained:  Verbal   Consent given by:  Patient   Risks discussed:  Infection, pain, poor cosmetic result, poor wound healing and need for additional repair Anesthesia (see MAR for exact dosages):    Anesthesia method:  None Laceration details:    Location:  Shoulder/arm   Shoulder/arm location:  R lower arm Repair type:    Repair type:  Simple Pre-procedure details:    Preparation:  Patient was prepped and draped in usual sterile fashion and imaging obtained to evaluate for foreign bodies Exploration:    Hemostasis achieved with:  Direct pressure   Wound exploration: wound explored through full range of motion and entire depth of wound probed and visualized     Wound extent: no foreign bodies/material noted, no muscle damage noted, no nerve  damage noted, no tendon damage noted, no underlying fracture noted and no vascular damage noted     Contaminated: yes   Treatment:    Area cleansed with:  Shur-Clens   Amount of cleaning:  Extensive Approximation:    Approximation:  Loose Post-procedure details:    Dressing:  Non-adherent dressing and splint for protection   Patient tolerance of procedure:  Tolerated well, no immediate complications Comments:     Patient with dog bite wound to the right forearm.  3 soft tissue injuries to the dorsal forearm, 2 to the lateral forearm.  Given the nature of injury these are not primarily closed.  They are not gaped open.  Well approximated at this time.  Areas were thoroughly cleansed using Surgicel.  Wound care instructions discussed with the patient.  A splint for protection.      Medications  HYDROcodone-acetaminophen (NORCO/VICODIN) 5-325 MG per tablet 1 tablet (1 tablet Oral Given 06/19/20 1853)     ____________________________________________   INITIAL IMPRESSION / ASSESSMENT AND PLAN / ED COURSE  Pertinent labs & imaging results that were available during my care of the patient were reviewed by me and considered in my medical decision making (see chart for details).  Review of the [place] CSRS was performed in accordance of the [place] prior to dispensing any controlled drugs.  Patient's diagnosis is consistent with animal bite to the right forearm, left buttocks.  Patient was bitten by her own dog.  Patient states that the dog has been in going through changes over the past several months.  Animal was scheduled to be euthanized as it had bitten her and her husband before.  There is no concern for rabies.  I discussed rabies, rabies prophylactic administration.  Patient denies wanting series.  She states that the animal is vaccinated, has been assessed by the vet and there is no concern for rabies.  Patient's tetanus shot is up-to-date.  Wounds are cleansed, dressed in the  emergency department.  Patient has a splint for protection to the right forearm.  Patient replaced on Augmentin.  Wound care instructions discussed with the patient.  Return precautions discussed with the patient.  Patient is scheduled to see her primary care in 2 days.  She can have wound care follow-up with primary care.  Again any signs and symptoms of infection she can return to the emergency department.  Patient is given ED precautions to return to the ED for any worsening or new symptoms.     ____________________________________________  FINAL CLINICAL IMPRESSION(S) / ED DIAGNOSES  Final diagnoses:  Dog bite, initial encounter      NEW MEDICATIONS STARTED DURING THIS VISIT:  ED Discharge Orders         Ordered    meloxicam (MOBIC) 15 MG tablet  Daily        06/19/20 1936    amoxicillin-clavulanate (AUGMENTIN) 875-125 MG tablet  2 times daily        06/19/20 1936              This chart was dictated using voice recognition software/Dragon. Despite best efforts to proofread, errors can occur which can change the meaning. Any change was purely unintentional.    Darletta Moll, PA-C 06/19/20 Al Corpus, MD 06/19/20 2215

## 2020-06-19 NOTE — ED Notes (Signed)
See triage note  Presents with dog bite   She has puncture wounds to left buttocks and right wrist/forearm area

## 2020-06-19 NOTE — ED Triage Notes (Addendum)
Pt bit by dog, lacerations to right wrist.  Reports puncture wound to buttocks.  Reports the dog has attacked before and they have appointment to put him down. He has attacked her husband and her before as well. Pt scared to go home with dog.  This RN called [facility] [employer] to report bite, waiting on call back.  Pt would like dog removed from home.  Shots not up to date

## 2020-06-20 DIAGNOSIS — G629 Polyneuropathy, unspecified: Secondary | ICD-10-CM | POA: Diagnosis not present

## 2020-06-20 DIAGNOSIS — Z79899 Other long term (current) drug therapy: Secondary | ICD-10-CM | POA: Diagnosis not present

## 2020-06-20 DIAGNOSIS — Z136 Encounter for screening for cardiovascular disorders: Secondary | ICD-10-CM | POA: Diagnosis not present

## 2020-06-20 DIAGNOSIS — J4520 Mild intermittent asthma, uncomplicated: Secondary | ICD-10-CM | POA: Diagnosis not present

## 2020-06-20 DIAGNOSIS — Z23 Encounter for immunization: Secondary | ICD-10-CM | POA: Diagnosis not present

## 2020-07-31 ENCOUNTER — Other Ambulatory Visit: Payer: Self-pay

## 2020-07-31 ENCOUNTER — Inpatient Hospital Stay: Payer: Medicare Other | Attending: Internal Medicine

## 2020-07-31 DIAGNOSIS — Z95828 Presence of other vascular implants and grafts: Secondary | ICD-10-CM

## 2020-07-31 DIAGNOSIS — Z8542 Personal history of malignant neoplasm of other parts of uterus: Secondary | ICD-10-CM | POA: Insufficient documentation

## 2020-07-31 DIAGNOSIS — Z452 Encounter for adjustment and management of vascular access device: Secondary | ICD-10-CM | POA: Insufficient documentation

## 2020-07-31 MED ORDER — SODIUM CHLORIDE 0.9% FLUSH
10.0000 mL | Freq: Once | INTRAVENOUS | Status: AC
  Administered 2020-07-31: 10 mL via INTRAVENOUS
  Filled 2020-07-31: qty 10

## 2020-07-31 MED ORDER — HEPARIN SOD (PORK) LOCK FLUSH 100 UNIT/ML IV SOLN
500.0000 [IU] | Freq: Once | INTRAVENOUS | Status: AC
  Administered 2020-07-31: 500 [IU] via INTRAVENOUS
  Filled 2020-07-31: qty 5

## 2020-07-31 MED ORDER — HEPARIN SOD (PORK) LOCK FLUSH 100 UNIT/ML IV SOLN
INTRAVENOUS | Status: AC
  Filled 2020-07-31: qty 5

## 2020-08-08 DIAGNOSIS — M25551 Pain in right hip: Secondary | ICD-10-CM | POA: Diagnosis not present

## 2020-08-30 ENCOUNTER — Other Ambulatory Visit: Payer: Self-pay | Admitting: Nurse Practitioner

## 2020-08-30 NOTE — Telephone Encounter (Signed)
We have not seen patient since 2019.  She is well established with [facility].  I recommend she received refills through [facility].  Elmo Putt, please let patient know.

## 2020-09-02 NOTE — Telephone Encounter (Signed)
Spoke with pt. This was sent to our office in error. Pt is getting medication through [facility].

## 2020-09-03 ENCOUNTER — Other Ambulatory Visit: Payer: Self-pay

## 2020-09-03 ENCOUNTER — Telehealth: Payer: Self-pay | Admitting: Gastroenterology

## 2020-09-03 NOTE — Telephone Encounter (Signed)
Patient states she needs a refill of medication dexilant sent to [facility]. Pt last seen 5.2021 and scheduled for 1.2022 appt.

## 2020-09-03 NOTE — Telephone Encounter (Signed)
Inform better to try prilosec first 40 mg as it is cheaper and if fails can try dexilant- insurances often want [place] to try prilosec first

## 2020-09-11 MED ORDER — OMEPRAZOLE 40 MG PO CPDR
40.0000 mg | DELAYED_RELEASE_CAPSULE | Freq: Every day | ORAL | 0 refills | Status: DC
Start: 2020-09-11 — End: 2023-04-28

## 2020-09-11 NOTE — Telephone Encounter (Signed)
Pt agrees to try the prilosec. Prescription has been sent.

## 2020-09-17 DIAGNOSIS — H401121 Primary open-angle glaucoma, left eye, mild stage: Secondary | ICD-10-CM | POA: Diagnosis not present

## 2020-09-17 DIAGNOSIS — H16223 Keratoconjunctivitis sicca, not specified as Sjogren's, bilateral: Secondary | ICD-10-CM | POA: Diagnosis not present

## 2020-09-17 DIAGNOSIS — H40052 Ocular hypertension, left eye: Secondary | ICD-10-CM | POA: Diagnosis not present

## 2020-09-17 DIAGNOSIS — H401112 Primary open-angle glaucoma, right eye, moderate stage: Secondary | ICD-10-CM | POA: Diagnosis not present

## 2020-10-21 ENCOUNTER — Encounter: Payer: Self-pay | Admitting: Gastroenterology

## 2020-10-21 ENCOUNTER — Other Ambulatory Visit: Payer: Self-pay

## 2020-10-21 ENCOUNTER — Ambulatory Visit (INDEPENDENT_AMBULATORY_CARE_PROVIDER_SITE_OTHER): Payer: Medicare Other | Admitting: Gastroenterology

## 2020-10-21 VITALS — BP 158/83 | HR 66 | Ht 63.0 in | Wt 157.0 lb

## 2020-10-21 DIAGNOSIS — Z1509 Genetic susceptibility to other malignant neoplasm: Secondary | ICD-10-CM | POA: Diagnosis not present

## 2020-10-21 DIAGNOSIS — K581 Irritable bowel syndrome with constipation: Secondary | ICD-10-CM | POA: Diagnosis not present

## 2020-10-21 DIAGNOSIS — Z809 Family history of malignant neoplasm, unspecified: Secondary | ICD-10-CM

## 2020-10-21 MED ORDER — NA SULFATE-K SULFATE-MG SULF 17.5-3.13-1.6 GM/177ML PO SOLN: 1.0000 | Freq: Once | ORAL | 0 refills | Status: AC

## 2020-10-21 NOTE — Progress Notes (Signed)
Jonathon Bellows MD, MRCP(U.K) [address]  [place]  [place], [place] [address]  Main: [phone]  Fax: [phone]   Primary Care Physician: Philmore Pali, NP  Primary Gastroenterologist:  Dr. Jonathon Bellows   To see me for abdominal pain  HPI: Julia Osborne is a 60 y.o. female   Summary of history :  Initially  seen in February 2020 for dysphagia and Lynch syndrome. She has a history of endometrial cancer, colon cancer, GERD. She follows with Dr. Ardyth Man oncology. [facility]. Carries a history of dysphagia, GERD and CA of her colon,status post right hemicolectomy. History of stage IV uterine cancer. Colon polyp resected in October 2016 showed an adenoma with focal high-grade dysplasia.  10/24/18: H pylori - negative  11/03/2018: [id number] duodenal . Gastric bx shows mild chronic gastritis .  Colonoscopy: Hyperplastic rectal polyp.  04/24/2019:normal CBC and CMP.  12/25/2019: Capsule study of the small bowel showed a few tiny polyps in the proximal small bowel were noted.  Probably 2 to 3 mm in size.  A few lesions suggestive of small bowel AVMs were seen in the distal small bowel.  Nonbleeding.   Interval history 01/29/2020-10/21/2020 02/12/2020: Push enteroscopy polyps in the first portion of the duodenum were noted 2 to 4 mm in size.  Resected and retrieved.  Biopsy report demonstrated peptic duodenitis She has not yet had a skin appointment to have an annual skin exam.   She states that the main reason to be seeing me today is that she has had a lot of bloating, abdominal distention.  Constipation has been worse recently.  She takes tramadol daily.  Her brother passed away recently from leukemia which changed her diet.  She has been taking Amitiza daily but does not seem to have resulted in a daily bowel movement.  She has a bowel movement which is hard every few days.  Her abdominal distention and  discomfort is usually worse when she has not had a good bowel movement.     Current Outpatient Medications  Medication Sig Dispense Refill  . albuterol (PROVENTIL HFA;VENTOLIN HFA) 108 (90 Base) MCG/ACT inhaler Inhale 2 puffs into the lungs every 2 (two) hours as needed for wheezing or shortness of breath (cough). 1 Inhaler 3  . albuterol (PROVENTIL) (2.5 MG/3ML) 0.083% nebulizer solution Take 3 mLs (2.5 mg total) by nebulization every 6 (six) hours as needed for wheezing or shortness of breath. 75 mL 12  . ALPRAZolam (XANAX) 0.25 MG tablet Take 0.125 mg by mouth daily as needed for anxiety.     Marland Kitchen amoxicillin-clavulanate (AUGMENTIN) 875-125 MG tablet Take 1 tablet by mouth 2 (two) times daily. 14 tablet 0  . buPROPion (WELLBUTRIN SR) 150 MG 12 hr tablet Take 1 tablet by mouth daily.    . citalopram (CELEXA) 40 MG tablet Take 40 mg by mouth at bedtime.  2  . dicyclomine (BENTYL) 10 MG capsule Take 1 capsule (10 mg total) by mouth 4 (four) times daily -  before meals and at bedtime. 120 capsule 2  . gabapentin (NEURONTIN) 300 MG capsule Take 300 mg by mouth 2 (two) times daily.    Marland Kitchen loratadine (CLARITIN) 10 MG tablet Take 10 mg by mouth daily as needed for allergies.     Marland Kitchen lubiprostone (AMITIZA) 8 MCG capsule Take 1 capsule (8 mcg total) by mouth 2 (two) times daily with a meal. Take medication with a meal to avoid nausea. (Patient taking differently: Take 8  mcg by mouth 2 (two) times daily with a meal. Take medication with a meal to avoid nausea. Pt taking daily) 60 capsule 11  . LUMIGAN 0.01 % SOLN Place 1 drop into the right eye at bedtime.    Marland Kitchen LYRICA 75 MG capsule TAKE 1 CAPSULE BY MOUTH EVERYDAY AT BEDTIME 30 capsule 2  . meloxicam (MOBIC) 15 MG tablet Take 1 tablet (15 mg total) by mouth daily. 30 tablet 0  . Naphazoline-Pheniramine (ALLERGY EYE OP) Place 1-2 drops into both eyes 2 (two) times daily as needed (ITCHY, WATERY EYES).    Marland Kitchen omeprazole (PRILOSEC) 40 MG capsule Take 1 capsule (40  mg total) by mouth daily. 90 capsule 0  . ondansetron (ZOFRAN) 8 MG tablet Take 8 mg by mouth daily as needed (CAR SICKNESS).    Marland Kitchen PAZEO 0.7 % SOLN Place 1 drop into both eyes every morning.    Marland Kitchen SIMBRINZA 1-0.2 % SUSP Apply 1 drop to eye 2 (two) times daily.    . sodium chloride (OCEAN) 0.65 % SOLN nasal spray Place 1 spray into both nostrils as needed for congestion.    . sucralfate (CARAFATE) 1 GM/10ML suspension Take 10 mLs (1 g total) by mouth 4 (four) times daily. 420 mL 1  . traMADol (ULTRAM) 50 MG tablet TAKE 1 TABLET BY MOUTH DAILY AS NEEDED. 30 tablet 0  . traZODone (DESYREL) 50 MG tablet Take 50 mg by mouth at bedtime.    Marland Kitchen DEXILANT 60 MG capsule TAKE 1 CAPSULE BY MOUTH EVERY DAY (Patient not taking: Reported on 10/21/2020) 90 capsule 3   No current facility-administered medications for this visit.    Allergies as of 10/21/2020 - Review Complete 10/21/2020  Allergen Reaction Noted  . Codeine Rash 08/25/2011    ROS:  General: Negative for anorexia, weight loss, fever, chills, fatigue, weakness. ENT: Negative for hoarseness, difficulty swallowing , nasal congestion. CV: Negative for chest pain, angina, palpitations, dyspnea on exertion, peripheral edema.  Respiratory: Negative for dyspnea at rest, dyspnea on exertion, cough, sputum, wheezing.  GI: See history of present illness. GU:  Negative for dysuria, hematuria, urinary incontinence, urinary frequency, nocturnal urination.  Endo: Negative for unusual weight change.    Physical Examination:   BP (!) 158/83 (BP Location: Left Arm, Patient Position: Sitting, Cuff Size: Normal)   Pulse 66   Ht 5\' 3"  (1.6 m)   Wt 157 lb (71.2 kg)   BMI 27.81 kg/m   General: Well-nourished, well-developed in no acute distress.  Eyes: No icterus. Conjunctivae pink. Abdomen: Bowel sounds are normal, nontender, nondistended, no hepatosplenomegaly or masses, no abdominal bruits or hernia , no rebound or guarding.   Extremities: No lower  extremity edema. No clubbing or deformities. Neuro: Alert and oriented x 3.  Grossly intact. Skin: Warm and dry, no jaundice.   Psych: Alert and cooperative, normal mood and affect.   Imaging Studies: No results found.  Assessment and Plan:   Julia Osborne is a 60 y.o. y/o female here to follow up for abdominal pain.  She has a history of Lynch syndrome. History of colon cancer with right hemicolectomy, uterine cancer. Recent abdominal discomfort which is suggestive of IBS constipation likely was secondary to use of tramadol and likely abdominal adhesions from prior surgery.  1. Stop smoking 2.  . Requires annual skin exam to screen for skin cancer.Did not obtain the same after last visit will refer again. 5. Annual urine analysis- will check this visit  6. Colonoscopy every 2  to 3 years:We will schedule soon 7.    Appears that the Amitiza has failed.  Will commence on Trulance.  Will call back in 3 weeks.  If no better will add lactulose 8. Follow-up CT scan of the abdomen, chest which has been ordered by Dr. Rogue Bussing which will also help [place] to look for any other causes of abdominal pain.  I have discussed alternative options, risks & benefits,  which include, but are not limited to, bleeding, infection, perforation,respiratory complication & drug reaction.  The patient agrees with this plan & written consent will be obtained.    Dr Jonathon Bellows  MD,MRCP [facility]) Follow up in 3 weeks telephone visit

## 2020-10-22 ENCOUNTER — Other Ambulatory Visit: Payer: Self-pay | Admitting: Nurse Practitioner

## 2020-10-22 DIAGNOSIS — J4520 Mild intermittent asthma, uncomplicated: Secondary | ICD-10-CM | POA: Diagnosis not present

## 2020-10-22 DIAGNOSIS — Z1231 Encounter for screening mammogram for malignant neoplasm of breast: Secondary | ICD-10-CM

## 2020-10-22 DIAGNOSIS — F514 Sleep terrors [night terrors]: Secondary | ICD-10-CM | POA: Diagnosis not present

## 2020-10-22 DIAGNOSIS — E785 Hyperlipidemia, unspecified: Secondary | ICD-10-CM | POA: Diagnosis not present

## 2020-10-22 DIAGNOSIS — G629 Polyneuropathy, unspecified: Secondary | ICD-10-CM | POA: Diagnosis not present

## 2020-10-22 DIAGNOSIS — Z79899 Other long term (current) drug therapy: Secondary | ICD-10-CM | POA: Diagnosis not present

## 2020-10-22 DIAGNOSIS — M79601 Pain in right arm: Secondary | ICD-10-CM | POA: Diagnosis not present

## 2020-10-28 ENCOUNTER — Ambulatory Visit
Admission: RE | Admit: 2020-10-28 | Discharge: 2020-10-28 | Disposition: A | Discharge status: Home / Self Care | Payer: Medicare Other | Source: Ambulatory Visit | Attending: Internal Medicine | Admitting: Internal Medicine

## 2020-10-28 ENCOUNTER — Other Ambulatory Visit: Payer: Self-pay

## 2020-10-28 DIAGNOSIS — Z85038 Personal history of other malignant neoplasm of large intestine: Secondary | ICD-10-CM | POA: Diagnosis not present

## 2020-10-28 DIAGNOSIS — M5137 Other intervertebral disc degeneration, lumbosacral region: Secondary | ICD-10-CM | POA: Diagnosis not present

## 2020-10-28 DIAGNOSIS — K439 Ventral hernia without obstruction or gangrene: Secondary | ICD-10-CM | POA: Diagnosis not present

## 2020-10-28 DIAGNOSIS — C541 Malignant neoplasm of endometrium: Secondary | ICD-10-CM | POA: Insufficient documentation

## 2020-10-28 DIAGNOSIS — K429 Umbilical hernia without obstruction or gangrene: Secondary | ICD-10-CM | POA: Diagnosis not present

## 2020-10-28 DIAGNOSIS — M47816 Spondylosis without myelopathy or radiculopathy, lumbar region: Secondary | ICD-10-CM | POA: Diagnosis not present

## 2020-10-28 MED ORDER — IOHEXOL 300 MG/ML  SOLN
100.0000 mL | Freq: Once | INTRAMUSCULAR | Status: AC | PRN
  Administered 2020-10-28: 100 mL via INTRAVENOUS

## 2020-10-29 ENCOUNTER — Other Ambulatory Visit
Admission: RE | Admit: 2020-10-29 | Discharge: 2020-10-29 | Disposition: A | Discharge status: Home / Self Care | Payer: Medicare Other | Source: Ambulatory Visit | Attending: Gastroenterology | Admitting: Gastroenterology

## 2020-10-29 DIAGNOSIS — U071 COVID-19: Secondary | ICD-10-CM | POA: Diagnosis not present

## 2020-10-29 DIAGNOSIS — Z01812 Encounter for preprocedural laboratory examination: Secondary | ICD-10-CM | POA: Diagnosis present

## 2020-10-30 ENCOUNTER — Inpatient Hospital Stay: Payer: Medicare Other

## 2020-10-30 ENCOUNTER — Inpatient Hospital Stay: Payer: Medicare Other | Admitting: Internal Medicine

## 2020-10-30 ENCOUNTER — Telehealth: Payer: Self-pay | Admitting: Internal Medicine

## 2020-10-30 ENCOUNTER — Telehealth: Payer: Self-pay

## 2020-10-30 ENCOUNTER — Other Ambulatory Visit: Payer: Self-pay

## 2020-10-30 LAB — SARS CORONAVIRUS 2 (TAT 6-24 HRS): SARS Coronavirus 2: POSITIVE — AB

## 2020-10-30 NOTE — Telephone Encounter (Signed)
-----   Message from Jonathon Bellows, MD sent at 10/30/2020  8:10 AM EST ----- Julia Osborne  Covid Positive- need to reschedule procedure for tomorrow and patient should follow up with Philmore Pali, NP for the COVID

## 2020-10-30 NOTE — Progress Notes (Signed)
Procedure had to be rescheduled due to patient testing positive for Covid. Instructions will be mailed out.

## 2020-10-30 NOTE — Progress Notes (Signed)
Julia Osborne/Julia Osborne  Covid Positive- need to reschedule procedure for tomorrow and patient should follow up with Chauncy Passy Rudi Rummage, NP for the COVID

## 2020-10-30 NOTE — Telephone Encounter (Signed)
Pt called to report she tested + for COVID on 10/30/20. Rescheduled pt for 11/20/20.

## 2020-10-30 NOTE — Telephone Encounter (Signed)
Patient has been informed of covid results and procedure will be rescheduled. Endo unit has been notified. Updated instructions will be sent.

## 2020-10-31 ENCOUNTER — Telehealth: Payer: Self-pay | Admitting: *Deleted

## 2020-10-31 ENCOUNTER — Emergency Department
Admission: EM | Admit: 2020-10-31 | Discharge: 2020-10-31 | Disposition: A | Discharge status: Home / Self Care | Payer: Medicare Other | Attending: Emergency Medicine | Admitting: Emergency Medicine

## 2020-10-31 ENCOUNTER — Emergency Department: Payer: Medicare Other

## 2020-10-31 ENCOUNTER — Other Ambulatory Visit: Payer: Self-pay

## 2020-10-31 ENCOUNTER — Encounter: Payer: Self-pay | Admitting: *Deleted

## 2020-10-31 DIAGNOSIS — J449 Chronic obstructive pulmonary disease, unspecified: Secondary | ICD-10-CM | POA: Insufficient documentation

## 2020-10-31 DIAGNOSIS — R0789 Other chest pain: Secondary | ICD-10-CM | POA: Diagnosis not present

## 2020-10-31 DIAGNOSIS — R079 Chest pain, unspecified: Secondary | ICD-10-CM

## 2020-10-31 DIAGNOSIS — J029 Acute pharyngitis, unspecified: Secondary | ICD-10-CM

## 2020-10-31 DIAGNOSIS — U071 COVID-19: Secondary | ICD-10-CM | POA: Insufficient documentation

## 2020-10-31 DIAGNOSIS — J45909 Unspecified asthma, uncomplicated: Secondary | ICD-10-CM | POA: Diagnosis not present

## 2020-10-31 DIAGNOSIS — R059 Cough, unspecified: Secondary | ICD-10-CM | POA: Diagnosis not present

## 2020-10-31 DIAGNOSIS — Z8542 Personal history of malignant neoplasm of other parts of uterus: Secondary | ICD-10-CM | POA: Insufficient documentation

## 2020-10-31 DIAGNOSIS — F17210 Nicotine dependence, cigarettes, uncomplicated: Secondary | ICD-10-CM | POA: Insufficient documentation

## 2020-10-31 DIAGNOSIS — Z85038 Personal history of other malignant neoplasm of large intestine: Secondary | ICD-10-CM | POA: Diagnosis not present

## 2020-10-31 LAB — CBC
HCT: 34.9 % — ABNORMAL LOW (ref 36.0–46.0)
Hemoglobin: 11.7 g/dL — ABNORMAL LOW (ref 12.0–15.0)
MCH: 30.5 pg (ref 26.0–34.0)
MCHC: 33.5 g/dL (ref 30.0–36.0)
MCV: 91.1 fL (ref 80.0–100.0)
Platelets: 267 10*3/uL (ref 150–400)
RBC: 3.83 MIL/uL — ABNORMAL LOW (ref 3.87–5.11)
RDW: 13.7 % (ref 11.5–15.5)
WBC: 11 10*3/uL — ABNORMAL HIGH (ref 4.0–10.5)
nRBC: 0 % (ref 0.0–0.2)

## 2020-10-31 LAB — BASIC METABOLIC PANEL
Anion gap: 11 (ref 5–15)
BUN: 11 mg/dL (ref 6–20)
CO2: 21 mmol/L — ABNORMAL LOW (ref 22–32)
Calcium: 8.9 mg/dL (ref 8.9–10.3)
Chloride: 106 mmol/L (ref 98–111)
Creatinine, Ser: 0.78 mg/dL (ref 0.44–1.00)
GFR, Estimated: >60 mL/min (ref >60)
Glucose, Bld: 93 mg/dL (ref 70–99)
Potassium: 3.4 mmol/L — ABNORMAL LOW (ref 3.5–5.1)
Sodium: 138 mmol/L (ref 135–145)

## 2020-10-31 LAB — TROPONIN I (HIGH SENSITIVITY): Troponin I (High Sensitivity): 4 ng/L (ref <18)

## 2020-10-31 MED ORDER — DEXAMETHASONE 10 MG/ML FOR PEDIATRIC ORAL USE
10.0000 mg | Freq: Once | INTRAMUSCULAR | Status: AC
  Administered 2020-10-31: 10 mg via ORAL
  Filled 2020-10-31: qty 1

## 2020-10-31 MED ORDER — KETOROLAC TROMETHAMINE 30 MG/ML IJ SOLN
30.0000 mg | Freq: Once | INTRAMUSCULAR | Status: AC
  Administered 2020-10-31: 30 mg via INTRAMUSCULAR
  Filled 2020-10-31: qty 1

## 2020-10-31 MED ORDER — HYDROCOD POLST-CPM POLST ER 10-8 MG/5ML PO SUER: 5.0000 mL | Freq: Every evening | ORAL | 0 refills | Status: DC | PRN

## 2020-10-31 NOTE — ED Triage Notes (Signed)
Pt reports testing positive yesterday for covid.  Pt has sob, chest heaviness and a cough .  Sx for 2 days.  Worse today. hs copd   Pt alert  Speech clear.

## 2020-10-31 NOTE — ED Notes (Signed)
Pt sitting up in bed awake and alert; NADN.  VSS.  RR even and unlabored on RA - o2 sats 99%.  ptt reports ongoing persistent cough with HA - pt also c/o loss of taste.  No n/v/d verbalized.  Abdomen soft nontender.  Skin warm dry and intact.

## 2020-10-31 NOTE — Telephone Encounter (Signed)
Called to discuss with patient about COVID-19 symptoms and the use of one of the available treatments for those with mild to moderate Covid symptoms and at a high risk of hospitalization.  Pt appears to qualify for outpatient treatment due to co-morbid conditions and/or a member of an at-risk group in accordance with the FDA Emergency Use Authorization.   Congestion, SOB, loss of taste, HA, asthma, leg and back ache.   Symptom onset: mid last week, unsure of the exact day. Vaccinated: No Booster? No Immunocompromised? No Qualifiers: COPD, Asthma, Hx of Cancer, Linch Syndrome  Patient would like to talk with APP if possible     Julia Osborne, Gillis Ends

## 2020-10-31 NOTE — ED Provider Notes (Signed)
[facility] Emergency Department Provider Note   ____________________________________________   Event Date/Time   First MD Initiated Contact with Patient 10/31/20 2100     (approximate)  I have reviewed the triage vital signs and the nursing notes.   HISTORY  Chief Complaint covid exposure    HPI Julia Osborne is a 60 y.o. female with past medical history of COPD, colon cancer status post resection, endometrial cancer status post hysterectomy, and GERD who presents to the ED complaining of chest pain.  Patient reports that she started having cough, congestion, headache, and sore throat about 5 days ago.  She subsequently tested positive for COVID-19 2 days ago and since then she has had increasing soreness in her chest when she takes a deep breath.  She denies any difficulty breathing at rest, but states her cough and headache make it hard for her to sleep.  She denies any fevers and has not had any nausea, vomiting, abdominal pain, or diarrhea.  She has not been vaccinated against COVID-19.        Past Medical History:  Diagnosis Date  . Adenocarcinoma of colon ([place]) 09/13/2015   Partial colon resection and chemo tx's.   . Anemia   . Anxiety   . Asthma   . Cancer ([place])    endometrial; cancer cells in intestine  . COPD (chronic obstructive pulmonary disease) ([place])    no definite diagnosis  . Depression   . Diverticulitis   . Dyspareunia 05/16/2015  . Family history of cancer   . Family history of kidney cancer   . GERD (gastroesophageal reflux disease)   . Glaucoma   . Indigestion   . Lynch syndrome   . Neuropathy    feet and hands  . Uterine fibroid   . Vaginal dryness 05/16/2015  . Vaginal itching 07/16/2015  . Vaginal Pap smear, abnormal     Patient Active Problem List   Diagnosis Date Noted  . History of endometrial cancer 06/21/2019  . Diverticulitis 06/21/2019  . COPD (chronic obstructive pulmonary disease) ([place]) 06/21/2019  .  Asthma 06/21/2019  . Uterine fibroid 06/21/2019  . Anxiety disorder, unspecified 06/21/2019  . Anemia 06/21/2019  . Abdominal pain 12/21/2016  . Lynch syndrome 10/31/2015  . Genetic testing 10/21/2015  . Abdominal pain, left upper quadrant 10/17/2015  . Family history of cancer   . Family history of kidney cancer   . Adenocarcinoma of colon ([place]) 09/13/2015  . Colon neoplasm 08/30/2015  . Vaginal itching 07/16/2015  . History of colonic polyps   . 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 drug ([place]) 06/28/2015  . Constipation 06/05/2015  . Abnormal CT scan, sigmoid colon 06/05/2015  . Vaginal dryness 05/16/2015  . Dyspareunia 05/16/2015  . Mucosal abnormality of stomach   . Reflux esophagitis   . Dysphagia   . Hematochezia   . Diverticulosis of colon without hemorrhage   . Rectal bleeding 02/14/2015  . GERD (gastroesophageal reflux disease) 02/14/2015  . Esophageal dysphagia 02/14/2015  . Abdominal pain, epigastric 02/14/2015  . Nausea with vomiting 02/14/2015  . Endometrial cancer ([place]) 10/09/2014  . Conversion reaction   . Depression   . Aphasia 09/11/2014    Past Surgical History:  Procedure Laterality Date  . ABDOMINAL HYSTERECTOMY    . APPENDECTOMY    . BIOPSY N/A 03/14/2015   Procedure: BIOPSY;  Surgeon: Daneil Dolin, MD;  Location: [facility] ORS;  Service: Endoscopy;  Laterality: N/A;  Gastric  . COLONOSCOPY N/A 01/28/2017   Procedure:  COLONOSCOPY;  Surgeon: Daneil Dolin, MD;  Location: [facility] SU[facility];  Service: Endoscopy;  Laterality: N/A;  11:30am  . COLONOSCOPY WITH PROPOFOL N/A 03/14/2015   RMR: Internal hemorrhoids. colonic diverticulosis. Incomplete examination. Prepartation inadequate.  . COLONOSCOPY WITH PROPOFOL N/A 07/04/2015   RMR: Colonic diverticulosis . Large polypoid lesion in the vicinity of the hepatic flexure status post saline-assisted piecmeal snare polypectomy  with ablation and tattooing as described. Sigmoid polyp removed as described above. sigmoid  colon polyp hyperplastic, hepatic flexure polyp with TA with focal high grade dysplasia   . COLONOSCOPY WITH PROPOFOL N/A 11/03/2018   Procedure: COLONOSCOPY WITH PROPOFOL;  Surgeon: Jonathon Bellows, MD;  Location[facility] ENDOSCOPY;  Service: Gastroenterology;  Laterality: N/A;  . ENTEROSCOPY N/A 02/12/2020   Procedure: ENTEROSCOPY;  Surgeon: Jonathon Bellows, MD;  Location: [facility] ENDOSCOPY;  Service: Gastroenterology;  Laterality: N/A;  . ESOPHAGEAL DILATION N/A 03/14/2015   Procedure: ESOPHAGEAL DILATION;  Surgeon: Daneil Dolin, MD;  Location: [facility];  Service: Endoscopy;  Laterality: N/A;  [place] 78  . ESOPHAGOGASTRODUODENOSCOPY N/A 05/19/2016   Procedure: ESOPHAGOGASTRODUODENOSCOPY (EGD);  Surgeon: Daneil Dolin, MD;  Location: [facility];  Service: Endoscopy;  Laterality: N/A;  215  . ESOPHAGOGASTRODUODENOSCOPY N/A 01/28/2017   Procedure: ESOPHAGOGASTRODUODENOSCOPY (EGD);  Surgeon: Daneil Dolin, MD;  [facility];  Service: Endoscopy;  Laterality: N/A;  . ESOPHAGOGASTRODUODENOSCOPY (EGD) WITH PROPOFOL N/A 03/14/2015   RMR: Mild erosive reflux esophagitis status post passage o f a [place] dilator. Abnormal gastric mucosa of uncertain significance as described above. status post biopsy, benign  . ESOPHAGOGASTRODUODENOSCOPY (EGD) WITH PROPOFOL N/A 11/18/2017   Procedure: ESOPHAGOGASTRODUODENOSCOPY (EGD) WITH PROPOFOL;  Surgeon: Daneil Dolin, MD;  [facility];  Service: Endoscopy;  Laterality: N/A;  12:15pm  . ESOPHAGOGASTRODUODENOSCOPY (EGD) WITH PROPOFOL N/A 11/03/2018   Procedure: ESOPHAGOGASTRODUODENOSCOPY (EGD) WITH PROPOFOL;  Surgeon: Jonathon Bellows, MD;  Location: [facility] ENDOSCOPY;  Service: Gastroenterology;  Laterality: N/A;  . GIVENS CAPSULE STUDY N/A 12/25/2019   Procedure: GIVENS CAPSULE STUDY;  Surgeon: Jonathon Bellows, MD;  Location: [facility] ENDOSCOPY;  Service: Gastroenterology;  Laterality: N/A;  . [name] DILATION N/A 05/19/2016   Procedure: [name] DILATION;  Surgeon: Daneil Dolin, MD;  [facility];  Service: Endoscopy;  Laterality: N/A;  . [name] DILATION N/A 01/28/2017   Procedure: [name] DILATION;  Surgeon: Daneil Dolin, MD;  [facility];  Service: Endoscopy;  Laterality: N/A;  [name] DILATION N/A 11/18/2017   Procedure: [name] DILATION;  Surgeon: Daneil Dolin, MD;  [facility];  Service: Endoscopy;  Laterality: N/A;  . PARTIAL COLECTOMY  08/30/2015   polyp with adenocarcinoma  . POLYPECTOMY N/A 07/04/2015   Procedure: POLYPECTOMY;  Surgeon: Daneil Dolin, MD;  Location: [facility];  Service: Endoscopy;  Laterality: N/A;  . PORTACATH PLACEMENT Right 09/2014  . TUBAL LIGATION      Prior to Admission medications   Medication Sig Start Date End Date Taking? Authorizing Provider  chlorpheniramine-HYDROcodone (TUSSIONEX PENNKINETIC ER) 10-8 MG/5ML SUER Take 5 mLs by mouth at bedtime as needed for cough. 10/31/20  Yes Blake Divine, MD  albuterol (PROVENTIL HFA;VENTOLIN HFA) 108 (90 Base) MCG/ACT inhaler Inhale 2 puffs into the lungs every 2 (two) hours as needed for wheezing or shortness of breath (cough). 10/17/15   Annitta Needs, NP  albuterol (PROVENTIL) (2.5 MG/3ML) 0.083% nebulizer solution Take 3 mLs (2.5 mg total) by nebulization every 6 (six) hours as needed for wheezing or shortness  of breath. 01/16/16   Penland, Kelby Fam, MD  ALPRAZolam Duanne Moron) 0.25 MG tablet Take 0.125 mg by mouth daily as needed for anxiety.     [provider]  amoxicillin-clavulanate (AUGMENTIN) 875-125 MG tablet Take 1 tablet by mouth 2 (two) times daily. 06/19/20   Cuthriell, Charline Bills, PA-C  buPROPion (WELLBUTRIN SR) 150 MG 12 hr tablet Take 1 tablet by mouth daily. 07/25/18   [provider]  citalopram (CELEXA) 40 MG tablet Take 40 mg by mouth at bedtime. 01/05/17   [provider]  DEXILANT 60 MG capsule TAKE 1 CAPSULE BY MOUTH EVERY DAY Patient not taking: Reported on 10/21/2020 02/24/19   Carlis Stable, NP   dicyclomine (BENTYL) 10 MG capsule Take 1 capsule (10 mg total) by mouth 4 (four) times daily -  before meals and at bedtime. 01/29/20   Jonathon Bellows, MD  gabapentin (NEURONTIN) 300 MG capsule Take 300 mg by mouth 2 (two) times daily. 03/10/19   [provider]  loratadine (CLARITIN) 10 MG tablet Take 10 mg by mouth daily as needed for allergies.     [provider]  lubiprostone (AMITIZA) 8 MCG capsule Take 1 capsule (8 mcg total) by mouth 2 (two) times daily with a meal. Take medication with a meal to avoid nausea. Patient taking differently: Take 8 mcg by mouth 2 (two) times daily with a meal. Take medication with a meal to avoid nausea. Pt taking daily 01/13/19   Rourk, Cristopher Estimable, MD  LUMIGAN 0.01 % SOLN Place 1 drop into the right eye at bedtime. 06/24/18   [provider]  LYRICA 75 MG capsule TAKE 1 CAPSULE BY MOUTH EVERYDAY AT BEDTIME 10/24/17   Holley Bouche, NP  meloxicam (MOBIC) 15 MG tablet Take 1 tablet (15 mg total) by mouth daily. 06/19/20   Cuthriell, Charline Bills, PA-C  Naphazoline-Pheniramine (ALLERGY EYE OP) Place 1-2 drops into both eyes 2 (two) times daily as needed (ITCHY, WATERY EYES).    [provider]  omeprazole (PRILOSEC) 40 MG capsule Take 1 capsule (40 mg total) by mouth daily. 09/11/20   Jonathon Bellows, MD  ondansetron (ZOFRAN) 8 MG tablet Take 8 mg by mouth daily as needed (CAR SICKNESS).    [provider]  PAZEO 0.7 % SOLN Place 1 drop into both eyes every morning. 06/27/18   [provider]  SIMBRINZA 1-0.2 % SUSP Apply 1 drop to eye 2 (two) times daily. 04/04/19   [provider]  sodium chloride (OCEAN) 0.65 % SOLN nasal spray Place 1 spray into both nostrils as needed for congestion.    [provider]  sucralfate (CARAFATE) 1 GM/10ML suspension Take 10 mLs (1 g total) by mouth 4 (four) times daily. 10/24/18   Jonathon Bellows, MD  traMADol (ULTRAM) 50 MG tablet TAKE 1 TABLET BY MOUTH DAILY AS NEEDED.  06/07/19   Verlon Au, NP  traZODone (DESYREL) 50 MG tablet Take 50 mg by mouth at bedtime.    [provider]    Allergies Codeine  Family History  Problem Relation Age of Onset  . Other Mother        clot that went to heart, deceased age 31ss  . Heart attack Father        age 55s, deceased  . Stroke Father   . Cancer Father        "at death determined he was ate up with cancer"  . Hypertension Sister   . Kidney cancer Sister  40  . Diabetes Brother   . Hypertension Brother   . Endometriosis Daughter   . Heart attack Maternal Grandfather   . Diabetes Sister   . Brain cancer Paternal Aunt   . Cancer Paternal Uncle        NOS  . Cancer Paternal Uncle        NOS  . Cancer Paternal Uncle        NOS  . Colon cancer Neg Hx     Social History Social History   Tobacco Use  . Smoking status: Current Every Day Smoker    Packs/day: 2.00    Years: 25.00    Pack years: 50.00    Types: Cigarettes  . Smokeless tobacco: Never Used  . Tobacco comment: Is trying to quit/wean off.  Vaping Use  . Vaping Use: Never used  Substance Use Topics  . Alcohol use: No  . Drug use: No    Review of Systems  Constitutional: No fever/chills Eyes: No visual changes. ENT: Positive for sore throat. Cardiovascular: Positive for chest pain. Respiratory: Positive for cough and shortness of breath. Gastrointestinal: No abdominal pain.  No nausea, no vomiting.  No diarrhea.  No constipation. Genitourinary: Negative for dysuria. Musculoskeletal: Negative for back pain. Skin: Negative for rash. Neurological: Positive for headaches, negative for focal weakness or numbness.  ____________________________________________   PHYSICAL EXAM:  VITAL SIGNS: ED Triage Vitals  Enc Vitals Group     BP 10/31/20 1932 113/76     Pulse Rate 10/31/20 1932 62     Resp 10/31/20 1932 20     Temp 10/31/20 1932 98.8 F (37.1 C)     Temp Source 10/31/20 1932 Oral     SpO2 10/31/20 1932 98 %      Weight 10/31/20 1930 156 lb (70.8 kg)     Height 10/31/20 1930 5\' 3"  (1.6 m)     Head Circumference --      Peak Flow --      Pain Score 10/31/20 1930 8     Pain Loc --      Pain Edu? --      Excl. in [place]? --     Constitutional: Alert and oriented. Eyes: Conjunctivae are normal. Head: Atraumatic. Nose: No congestion/rhinnorhea. Mouth/Throat: Mucous membranes are moist.  Posterior oropharynx erythematous with no edema or exudates. Neck: Normal ROM Cardiovascular: Normal rate, regular rhythm. Grossly normal heart sounds.  2+ radial pulses bilaterally. Respiratory: Normal respiratory effort.  No retractions. Lungs CTAB. Gastrointestinal: Soft and nontender. No distention. Genitourinary: deferred Musculoskeletal: No lower extremity tenderness nor edema. Neurologic:  Normal speech and language. No gross focal neurologic deficits are appreciated. Skin:  Skin is warm, dry and intact. No rash noted. Psychiatric: Mood and affect are normal. Speech and behavior are normal.  ____________________________________________   LABS (all labs ordered are listed, but only abnormal results are displayed)  Labs Reviewed  BASIC METABOLIC PANEL - Abnormal; Notable for the following components:      Result Value   Potassium 3.4 (*)    CO2 21 (*)    All other components within normal limits  CBC - Abnormal; Notable for the following components:   WBC 11.0 (*)    RBC 3.83 (*)    Hemoglobin 11.7 (*)    HCT 34.9 (*)    All other components within normal limits  TROPONIN I (HIGH SENSITIVITY)   ____________________________________________  EKG  ED ECG REPORT I, Blake Divine, the attending physician, personally viewed and interpreted  this ECG.   Date: 10/31/2020  EKG Time: 19:30  Rate: 62  Rhythm: normal sinus rhythm  Axis: Normal  Intervals:none  ST&T Change: None   PROCEDURES  Procedure(s) performed (including Critical  Care):  Procedures   ____________________________________________   INITIAL IMPRESSION / ASSESSMENT AND PLAN / ED COURSE       61 year old female with past medical history of COPD, GERD, colon cancer status post resection, and endometrial cancer status post hysterectomy who presents to the ED with about 5 days of worsening cough, congestion, shortness of breath, and pain in her chest with a deep breath.  Patient is not in any respiratory distress and is maintaining O2 sats on room air.  She currently primarily complains of headache and sore throat.  EKG shows no evidence of arrhythmia or ischemia and troponin is negative, I doubt ACS or PE.  Chest x-ray reviewed by me and shows no infiltrate, edema, or effusion.  She is appropriate for discharge home with symptomatic management and we will prescribe Tussionex.  She was counseled to return to the ED for new worsening symptoms, patient agrees with plan.      ____________________________________________   FINAL CLINICAL IMPRESSION(S) / ED DIAGNOSES  Final diagnoses:  COVID-19  Chest pain, unspecified type  Pharyngitis, unspecified etiology     ED Discharge Orders         Ordered    chlorpheniramine-HYDROcodone (TUSSIONEX PENNKINETIC ER) 10-8 MG/5ML SUER  At bedtime PRN        10/31/20 2113           Note:  This document was prepared using Dragon voice recognition software and may include unintentional dictation errors.   Blake Divine, MD 10/31/20 2117

## 2020-10-31 NOTE — Telephone Encounter (Signed)
Follow up call to Julia Osborne. Initially someone picked up and immediately hung up. I attempted to call back and received voicemail. I left voicemail with return call back number. Does not appear from RN note that Ms. Grismer would qualify for treatment based on symptoms starting middle of last week.   Terri Piedra, NP 10/31/2020 11:08 AM

## 2020-10-31 NOTE — ED Notes (Signed)
Pt agreeable with d/c plan as discussed by provider- this nurse has verbally reinforced d/c instructions and provided pt with written copy - pt acknowledges verbal understanding and denies any additional questions, concerns, needs.  Ambulatory at discharge to vehicle waiting (spoue transporting pt home)

## 2020-11-01 DIAGNOSIS — U071 COVID-19: Secondary | ICD-10-CM | POA: Diagnosis not present

## 2020-11-20 ENCOUNTER — Inpatient Hospital Stay: Payer: Medicare Other | Attending: Internal Medicine

## 2020-11-20 ENCOUNTER — Inpatient Hospital Stay: Payer: Medicare Other | Admitting: Internal Medicine

## 2020-11-20 NOTE — Progress Notes (Deleted)
[place] NOTE  Patient Care Team: Philmore Pali, NP as PCP - General (Nurse Practitioner) Patrici Ranks, MD (Inactive) as PCP - Hematology/Oncology (Hematology and Oncology) Daneil Dolin, MD as Consulting Physician (Gastroenterology) Cammie Sickle, MD as Consulting Physician (Hematology and Oncology)  CHIEF COMPLAINTS/PURPOSE OF CONSULTATION:  Endometrial cancer  #  Oncology History Overview Note  # OCT 2015- Uterine ca- STAGE IV [multiple lung nodules]; Nov 2015S/p radical hysterectomy, BSO, pelvic lymph node dissection/vaginal biopsies/extensive pelvic disease including positive lymph nodes and positive distal vaginal biopsy.]; [WFB]-endometroid G-2; LVI; positive Pelvic LN/vaginal Bx Jan-April 2016- carbo-Taxol x6 cycles Forestine Na; Dr.Penland]  # Oct 2016- colo- high grade dysplasia [Dr.Rourk; Dr.Jenkins s/p right hemi-colectomy]; last colo- may 2018;    # Genetic testing- [MSH6 mutation]/Lynch syndrome; EGD/colo every 2-3 qyears; urin cytology q6M  Histologic grade G2, histologic type endometrioid adenocarcinoma -----------------------------------------------------  DIAGNOSIS: Endometrioid endometrial cancer  STAGE: IV  ;GOALS: Control/palliative  CURRENT/MOST RECENT THERAPY surveillance    Endometrial cancer ([place])  07/12/2014 Imaging   CT C/A/P, pelvic adenopathy, multiple pulmonary nodules concerning for metastatic disease   07/30/2014 Initial Diagnosis   Endometrial cancer   07/31/2014 Pathology Results   endometrioid adenocarcinoma, G2, lymphovascular invasion present, positive pelvic lymph node and vaginal biopsy   07/31/2014 Definitive Surgery   radical hysterectomy, BSO, pelvic lymph node dissection and vaginal biopsies   09/25/2014 Procedure   Port-A-Cath placement in IR   10/18/2014 - 01/10/2015 Chemotherapy   Carboplatin/Taxol. First cycle given at [facility].  S/P 6 cycles total, 5 cycles given at [facility].   07/04/2015  Procedure   Colonoscopy by Dr. Gala Romney   07/04/2015 Pathology Results   Colon, polyp(s), vicinity of hepatic flexure - TUBULAR ADENOMA WITH FOCAL HIGH GRADE DYSPLASIA.    Procedure   Partial colectomy by Dr. Arnoldo Morale scheduled for 08/30/2015   10/31/2015 Genetic Testing   [facility] SYNDROME MSH6 Mutation   04/27/2016 Imaging   CT CAP- No acute process or evidence of metastatic disease in the chest. Right middle lobe pulmonary nodule is unchanged back to 11/20/2014, favoring a benign etiology   11/10/2016 Imaging   CT CAP- 1. No evidence of metastatic disease in the chest, abdomen or pelvis. 2. No evidence of local tumor recurrence at the ileocolic anastomosis in the right abdomen or in the pelvis. Decreased mild fat stranding at the base of the right mesentery, most consistent with postsurgical scarring. 3. Aortic atherosclerosis.   Adenocarcinoma of colon ([place])  07/04/2015 Pathology Results   Diagnosis 1. Colon, polyp(s), vicinity of hepatic flexure - TUBULAR ADENOMA WITH FOCAL HIGH GRADE DYSPLASIA. - NO INVASIVE CARCINOMA. 2. Colon, polyp(s), sigmoid - HYPERPLASTIC POLYP. - NO DYSPLASIA OR MALIGNANCY.   07/04/2015 Procedure   Colonoscopoy by Dr. Gala Romney- large polypoid colonic lesion.   08/30/2015 Definitive Surgery   Dr. Arnoldo Morale- right segmental colon resection   08/30/2015 Pathology Results   [id number] intramucosal adenocarcinoma of colon, 0.2 cm inding the laminal propria with negative resection margins and 0/17 lymph nodes.      HISTORY OF PRESENTING ILLNESS:  Julia Osborne 60 y.o.  female history of endometrial cancer stage IV/Lynch syndrome-current on surveillance is here for follow-up.  In the interim patient had upper endoscopy with GI-had duodenal polyps resected.  Patient denies any worsening shortness of breath or cough.  Appetite is good.  No weight loss.  Denies abdominal pain.  Denies any nausea vomiting.  Unfortunately continues smoke.  No blood in stool or black or  stools.  Review of Systems  Constitutional: Positive for malaise/fatigue. Negative for chills, diaphoresis, fever and weight loss.  HENT: Negative for sore throat.   Eyes: Negative for double vision.  Respiratory: Negative for cough, hemoptysis, sputum production, shortness of breath and wheezing.   Cardiovascular: Negative for chest pain, palpitations, orthopnea and leg swelling.  Gastrointestinal: Negative for blood in stool, constipation, diarrhea, heartburn, melena, nausea and vomiting.  Genitourinary: Negative for dysuria, frequency and urgency.  Musculoskeletal: Positive for back pain and joint pain.  Skin: Negative.  Negative for itching and rash.  Neurological: Positive for tingling. Negative for dizziness, focal weakness, weakness and headaches.  Endo/Heme/Allergies: Does not bruise/bleed easily.  Psychiatric/Behavioral: Negative for depression. The patient is not nervous/anxious and does not have insomnia.      MEDICAL HISTORY:  Past Medical History:  Diagnosis Date  . Adenocarcinoma of colon ([place]) 09/13/2015   Partial colon resection and chemo tx's.   . Anemia   . Anxiety   . Asthma   . Cancer ([place])    endometrial; cancer cells in intestine  . COPD (chronic obstructive pulmonary disease) ([place])    no definite diagnosis  . Depression   . Diverticulitis   . Dyspareunia 05/16/2015  . Family history of cancer   . Family history of kidney cancer   . GERD (gastroesophageal reflux disease)   . Glaucoma   . Indigestion   . Lynch syndrome   . Neuropathy    feet and hands  . Uterine fibroid   . Vaginal dryness 05/16/2015  . Vaginal itching 07/16/2015  . Vaginal Pap smear, abnormal     SURGICAL HISTORY: Past Surgical History:  Procedure Laterality Date  . ABDOMINAL HYSTERECTOMY    . APPENDECTOMY    . BIOPSY N/A 03/14/2015   Procedure: BIOPSY;  Surgeon: Daneil Dolin, MD;  Location: [facility];  Service: Endoscopy;  Laterality: N/A;  Gastric  . COLONOSCOPY N/A 01/28/2017    Procedure: COLONOSCOPY;  Surgeon: Daneil Dolin, MD;  Location: [facility] END[facility] SU[facility];  Service: Endoscopy;  Laterality: N/A;  11:30am  . COLONOSCOPY WITH PROPOFOL N/A 03/14/2015   RMR: Internal hemorrhoids. colonic diverticulosis. Incomplete examination. Prepartation inadequate.  . COLONOSCOPY WITH PROPOFOL N/A 07/04/2015   RMR: Colonic diverticulosis . Large polypoid lesion in the vicinity of the hepatic flexure status post saline-assisted piecmeal snare polypectomy  with ablation and tattooing as described. Sigmoid polyp removed as described above. sigmoid colon polyp hyperplastic, hepatic flexure polyp with TA with focal high grade dysplasia   . COLONOSCOPY WITH PROPOFOL N/A 11/03/2018   Procedure: COLONOSCOPY WITH PROPOFOL;  Surgeon: Jonathon Bellows, MD;  Location: [facility] ENDOSCOPY;  Service: Gastroenterology;  Laterality: N/A;  . ENTEROSCOPY N/A 02/12/2020   Procedure: ENTEROSCOPY;  Surgeon: Jonathon Bellows, MD;  Location: [facility] ENDOSCOPY;  Service: Gastroenterology;  Laterality: N/A;  . ESOPHAGEAL DILATION N/A 03/14/2015   Procedure: ESOPHAGEAL DILATION;  Surgeon: Daneil Dolin, MD;  Location: [facility];  Service: Endoscopy;  Laterality: N/A;  Maloney 11  . ESOPHAGOGASTRODUODENOSCOPY N/A 05/19/2016   Procedure: ESOPHAGOGASTRODUODENOSCOPY (EGD);  Surgeon: Daneil Dolin, MD;  [facility];  Service: Endoscopy;  Laterality: N/A;  215  . ESOPHAGOGASTRODUODENOSCOPY N/A 01/28/2017   Procedure: ESOPHAGOGASTRODUODENOSCOPY (EGD);  Surgeon: Daneil Dolin, MD;  [facility];  Service: Endoscopy;  Laterality: N/A;  . ESOPHAGOGASTRODUODENOSCOPY (EGD) WITH PROPOFOL N/A 03/14/2015   RMR: Mild erosive reflux esophagitis status post passage o f a Maloney dilator. Abnormal gastric mucosa of uncertain significance as described above.  status post biopsy, benign  . ESOPHAGOGASTRODUODENOSCOPY (EGD) WITH PROPOFOL N/A 11/18/2017   Procedure: ESOPHAGOGASTRODUODENOSCOPY (EGD) WITH PROPOFOL;  Surgeon: Daneil Dolin, MD;  [facility];  Service: Endoscopy;  Laterality: N/A;  12:15pm  . ESOPHAGOGASTRODUODENOSCOPY (EGD) WITH PROPOFOL N/A 11/03/2018   Procedure: ESOPHAGOGASTRODUODENOSCOPY (EGD) WITH PROPOFOL;  Surgeon: Jonathon Bellows, MD;  Location: [facility] ENDOSCOPY;  Service: Gastroenterology;  Laterality: N/A;  . GIVENS CAPSULE STUDY N/A 12/25/2019   Procedure: GIVENS CAPSULE STUDY;  Surgeon: Jonathon Bellows, MD;  Location: [facility] ENDOSCOPY;  Service: Gastroenterology;  Laterality: N/A;  . [name] DILATION N/A 05/19/2016   Procedure: [name] DILATION;  Surgeon: Daneil Dolin, MD;  [facility];  Service: Endoscopy;  Laterality: N/A;  . [name] DILATION N/A 01/28/2017   Procedure: [name] DILATION;  Surgeon: Daneil Dolin, MD;  [facility];  Service: Endoscopy;  Laterality: N/A;  [name] DILATION N/A 11/18/2017   Procedure: [name] DILATION;  Surgeon: Daneil Dolin, MD;  [facility];  Service: Endoscopy;  Laterality: N/A;  . PARTIAL COLECTOMY  08/30/2015   polyp with adenocarcinoma  . POLYPECTOMY N/A 07/04/2015   Procedure: POLYPECTOMY;  Surgeon: Daneil Dolin, MD;  Location: [facility];  Service: Endoscopy;  Laterality: N/A;  . PORTACATH PLACEMENT Right 09/2014  . TUBAL LIGATION      SOCIAL HISTORY: Social History   Socioeconomic History  . Marital status: Married    Spouse name: Not on file  . Number of children: 2  . Years of education: Not on file  . Highest education level: Not on file  Occupational History  . Not on file  Tobacco Use  . Smoking status: Current Every Day Smoker    Packs/day: 2.00    Years: 25.00    Pack years: 50.00    Types: Cigarettes  . Smokeless tobacco: Never Used  . Tobacco comment: Is trying to quit/wean off.  Vaping Use  . Vaping Use: Never used  Substance and Sexual Activity  . Alcohol use: No  . Drug use: No  . Sexual activity: Not Currently    Birth control/protection: Surgical    Comment: hyst  Other Topics Concern  .  Not on file  Social History Narrative   #  [place]ives in [place];self; smoking- 1/2 ppd; no alcohol; used to run machines;       FHx-Dad- MI; ? Cancer on autopsy; sisters/aunts- brain; lung cancer x2; oldest sister- kidney cancer      Pt has 74 year old son;daughter 7 years. Brothers- states to have spoken to her family re: importance of them being checked for lynch.    Social Determinants of Health   Financial Resource Strain: Not on file  Food Insecurity: Not on file  Transportation Needs: Not on file  Physical Activity: Not on file  Stress: Not on file  Social Connections: Not on file  Intimate Partner Violence: Not on file    FAMILY HISTORY:  Family History  Problem Relation Age of Onset  . Other Mother        clot that went to heart, deceased age 67ss  . Heart attack Father        age 34s, deceased  . Stroke Father   . Cancer Father        "at death determined he was ate up with cancer"  . Hypertension Sister   . Kidney cancer Sister 37  . Diabetes Brother   . Hypertension Brother   . Endometriosis Daughter   .  Heart attack Maternal Grandfather   . Diabetes Sister   . Brain cancer Paternal Aunt   . Cancer Paternal Uncle        NOS  . Cancer Paternal Uncle        NOS  . Cancer Paternal Uncle        NOS  . Colon cancer Neg Hx     ALLERGIES:  is allergic to codeine.  MEDICATIONS:  Current Outpatient Medications  Medication Sig Dispense Refill  . albuterol (PROVENTIL HFA;VENTOLIN HFA) 108 (90 Base) MCG/ACT inhaler Inhale 2 puffs into the lungs every 2 (two) hours as needed for wheezing or shortness of breath (cough). 1 Inhaler 3  . albuterol (PROVENTIL) (2.5 MG/3ML) 0.083% nebulizer solution Take 3 mLs (2.5 mg total) by nebulization every 6 (six) hours as needed for wheezing or shortness of breath. 75 mL 12  . ALPRAZolam (XANAX) 0.25 MG tablet Take 0.125 mg by mouth daily as needed for anxiety.     Marland Kitchen amoxicillin-clavulanate (AUGMENTIN) 875-125 MG tablet Take 1  tablet by mouth 2 (two) times daily. 14 tablet 0  . buPROPion (WELLBUTRIN SR) 150 MG 12 hr tablet Take 1 tablet by mouth daily.    . chlorpheniramine-HYDROcodone (TUSSIONEX PENNKINETIC ER) 10-8 MG/5ML SUER Take 5 mLs by mouth at bedtime as needed for cough. 140 mL 0  . citalopram (CELEXA) 40 MG tablet Take 40 mg by mouth at bedtime.  2  . DEXILANT 60 MG capsule TAKE 1 CAPSULE BY MOUTH EVERY DAY (Patient not taking: Reported on 10/21/2020) 90 capsule 3  . dicyclomine (BENTYL) 10 MG capsule Take 1 capsule (10 mg total) by mouth 4 (four) times daily -  before meals and at bedtime. 120 capsule 2  . gabapentin (NEURONTIN) 300 MG capsule Take 300 mg by mouth 2 (two) times daily.    Marland Kitchen loratadine (CLARITIN) 10 MG tablet Take 10 mg by mouth daily as needed for allergies.     Marland Kitchen lubiprostone (AMITIZA) 8 MCG capsule Take 1 capsule (8 mcg total) by mouth 2 (two) times daily with a meal. Take medication with a meal to avoid nausea. (Patient taking differently: Take 8 mcg by mouth 2 (two) times daily with a meal. Take medication with a meal to avoid nausea. Pt taking daily) 60 capsule 11  . LUMIGAN 0.01 % SOLN Place 1 drop into the right eye at bedtime.    Marland Kitchen LYRICA 75 MG capsule TAKE 1 CAPSULE BY MOUTH EVERYDAY AT BEDTIME 30 capsule 2  . meloxicam (MOBIC) 15 MG tablet Take 1 tablet (15 mg total) by mouth daily. 30 tablet 0  . Naphazoline-Pheniramine (ALLERGY EYE OP) Place 1-2 drops into both eyes 2 (two) times daily as needed (ITCHY, WATERY EYES).    Marland Kitchen omeprazole (PRILOSEC) 40 MG capsule Take 1 capsule (40 mg total) by mouth daily. 90 capsule 0  . ondansetron (ZOFRAN) 8 MG tablet Take 8 mg by mouth daily as needed (CAR SICKNESS).    Marland Kitchen PAZEO 0.7 % SOLN Place 1 drop into both eyes every morning.    Marland Kitchen SIMBRINZA 1-0.2 % SUSP Apply 1 drop to eye 2 (two) times daily.    . sodium chloride (OCEAN) 0.65 % SOLN nasal spray Place 1 spray into both nostrils as needed for congestion.    . sucralfate (CARAFATE) 1 GM/10ML  suspension Take 10 mLs (1 g total) by mouth 4 (four) times daily. 420 mL 1  . traMADol (ULTRAM) 50 MG tablet TAKE 1 TABLET BY MOUTH DAILY AS NEEDED.  30 tablet 0  . traZODone (DESYREL) 50 MG tablet Take 50 mg by mouth at bedtime.     No current facility-administered medications for this visit.      Marland Kitchen  PHYSICAL EXAMINATION: ECOG PERFORMANCE STATUS: 0 - Asymptomatic  There were no vitals filed for this visit. There were no vitals filed for this visit.  Physical Exam Constitutional:      Comments: Patient is alone.  HENT:     Head: Normocephalic and atraumatic.     Mouth/Throat:     Pharynx: No oropharyngeal exudate.  Eyes:     Pupils: Pupils are equal, round, and reactive to light.  Cardiovascular:     Rate and Rhythm: Normal rate and regular rhythm.  Pulmonary:     Effort: No respiratory distress.     Breath sounds: No wheezing.  Abdominal:     General: Bowel sounds are normal. There is no distension.     Palpations: Abdomen is soft. There is no mass.     Tenderness: There is no abdominal tenderness. There is no guarding or rebound.  Musculoskeletal:        General: No tenderness. Normal range of motion.     Cervical back: Normal range of motion and neck supple.  Skin:    General: Skin is warm.     Comments:    Neurological:     Mental Status: She is alert and oriented to person, place, and time.  Psychiatric:        Mood and Affect: Affect normal.      LABORATORY DATA:  I have reviewed the data as listed Lab Results  Component Value Date   WBC 11.0 (H) 10/31/2020   HGB 11.7 (L) 10/31/2020   HCT 34.9 (L) 10/31/2020   MCV 91.1 10/31/2020   PLT 267 10/31/2020   Recent Labs    04/30/20 1401 10/31/20 1946  NA 139 138  K 3.8 3.4*  CL 103 106  CO2 26 21*  GLUCOSE 110* 93  BUN 11 11  CREATININE 0.88 0.78  CALCIUM 8.5* 8.9  GFRNONAA >60 >60  GFRAA >60  --   PROT 7.0  --   ALBUMIN 3.7  --   AST 23  --   ALT 24  --   ALKPHOS 58  --   BILITOT 0.2*  --      RADIOGRAPHIC STUDIES: I have personally reviewed the radiological images as listed and agreed with the findings in the report. DG Chest 2 View  Result Date: 10/31/2020 CLINICAL DATA:  COVID 19 positive yesterday, chest heaviness, cough, history of endometrial and colorectal cancer EXAM: CHEST - 2 VIEW COMPARISON:  10/14/2019, 10/28/2020 FINDINGS: Frontal and lateral views of the chest demonstrate an unremarkable cardiac silhouette. No airspace disease, effusion, or pneumothorax. Stable scarring at the left base. No acute bony abnormalities. Stable right chest wall port. IMPRESSION: 1. No acute intrathoracic process. Electronically Signed   By: Randa Ngo M.D.   On: 10/31/2020 19:59   CT CHEST W CONTRAST  Result Date: 10/28/2020 CLINICAL DATA:  Restaging of endometrial cancer. History of colorectal cancer and Lynch syndrome, prior partial colon resection. EXAM: CT CHEST, ABDOMEN, AND PELVIS WITH CONTRAST TECHNIQUE: Multidetector CT imaging of the chest, abdomen and pelvis was performed following the standard protocol during bolus administration of intravenous contrast. CONTRAST:  178m OMNIPAQUE IOHEXOL 300 MG/ML  SOLN COMPARISON:  10/10/2019 FINDINGS: CT CHEST FINDINGS Cardiovascular: Right Port-A-Cath tip: SVC. Mediastinum/Nodes: Unremarkable Lungs/Pleura: Mild biapical pleuroparenchymal scarring appears stable. Bandlike  density medially in the left lower lobe compatible with atelectasis or scarring, and associated with mucus plugging as shown for example on image 111 of series 3. Musculoskeletal: Degenerative subcortical cyst formation in the left glenoid. CT ABDOMEN PELVIS FINDINGS Hepatobiliary: Unremarkable Pancreas: Unremarkable Spleen: Unremarkable Adrenals/Urinary Tract: Unremarkable Stomach/Bowel: Partial right colectomy.  Otherwise unremarkable. Vascular/Lymphatic: Aortoiliac atherosclerotic vascular disease. No pathologic adenopathy. Reproductive: Unremarkable Other: No supplemental  non-categorized findings. Musculoskeletal: Lumbar spondylosis and degenerative disc disease at L5-S1 contributing to bilateral foraminal impingement at this level. Small supraumbilical hernia contains adipose tissue. IMPRESSION: 1. No findings of recurrent malignancy. 2. Bandlike density medially in the left lower lobe compatible with atelectasis or scarring, with associated with mucus plugging. 3. Other imaging findings of potential clinical significance: Lumbar spondylosis and degenerative disc disease at L5-S1 contributing to bilateral foraminal impingement at this level. Small supraumbilical hernia contains adipose tissue. Degenerative subcortical cyst formation in the left glenoid. 4. Aortic atherosclerosis. Aortic Atherosclerosis (ICD10-I70.0). Electronically Signed   By: Van Clines M.D.   On: 10/28/2020 17:11   CT ABDOMEN PELVIS W CONTRAST  Result Date: 10/28/2020 CLINICAL DATA:  Restaging of endometrial cancer. History of colorectal cancer and Lynch syndrome, prior partial colon resection. EXAM: CT CHEST, ABDOMEN, AND PELVIS WITH CONTRAST TECHNIQUE: Multidetector CT imaging of the chest, abdomen and pelvis was performed following the standard protocol during bolus administration of intravenous contrast. CONTRAST:  120m OMNIPAQUE IOHEXOL 300 MG/ML  SOLN COMPARISON:  10/10/2019 FINDINGS: CT CHEST FINDINGS Cardiovascular: Right Port-A-Cath tip: SVC. Mediastinum/Nodes: Unremarkable Lungs/Pleura: Mild biapical pleuroparenchymal scarring appears stable. Bandlike density medially in the left lower lobe compatible with atelectasis or scarring, and associated with mucus plugging as shown for example on image 111 of series 3. Musculoskeletal: Degenerative subcortical cyst formation in the left glenoid. CT ABDOMEN PELVIS FINDINGS Hepatobiliary: Unremarkable Pancreas: Unremarkable Spleen: Unremarkable Adrenals/Urinary Tract: Unremarkable Stomach/Bowel: Partial right colectomy.  Otherwise unremarkable.  Vascular/Lymphatic: Aortoiliac atherosclerotic vascular disease. No pathologic adenopathy. Reproductive: Unremarkable Other: No supplemental non-categorized findings. Musculoskeletal: Lumbar spondylosis and degenerative disc disease at L5-S1 contributing to bilateral foraminal impingement at this level. Small supraumbilical hernia contains adipose tissue. IMPRESSION: 1. No findings of recurrent malignancy. 2. Bandlike density medially in the left lower lobe compatible with atelectasis or scarring, with associated with mucus plugging. 3. Other imaging findings of potential clinical significance: Lumbar spondylosis and degenerative disc disease at L5-S1 contributing to bilateral foraminal impingement at this level. Small supraumbilical hernia contains adipose tissue. Degenerative subcortical cyst formation in the left glenoid. 4. Aortic atherosclerosis. Aortic Atherosclerosis (ICD10-I70.0). Electronically Signed   By: WVan ClinesM.D.   On: 10/28/2020 17:11    ASSESSMENT & PLAN:   No problem-specific Assessment & Plan notes found for this encounter.   All questions were answered. The patient knows to call the clinic with any problems, questions or concerns.     GCammie Sickle MD 11/20/2020 12:46 PM

## 2020-11-20 NOTE — Assessment & Plan Note (Deleted)
#   Uterine Cancer endometrioid type grade 2; stage IV [lung nodules at presentation 2015/]-February 2021- CT scan abdomen pelvis/chest-negative for any obvious recurrence.   STABLE.  No clinical evidence of recurrence.  We will repeat surveillance imaging in 6 months.  Ordered.  # Lynch syndrome Ozzie Hoyle of high-grade polyp/status post colectomy: Last colonoscopy-Feb 2020; repeat in 2 years;  EGD- May 2021--reviewed.  Stable.  Urine cytology pending.   #Active smoker on Wellbutrin-discussed smoking cessation; declines.   #Peripheral neuropathy grade 2-3; STABLE;  continue tramadol as needed.  # Constipation- on amitizia-STABLE.   # # Patient is to unvaccinated to Covid-19.  Concerned about potential adverse events.  I again educated the patient/ regarding the concerns for extremely high risk of infection/serious complications from the new variants.  Benefits of Covid vaccine outweigh the risk at any time; and strongly recommend COVID-19 vaccination.   DISPOSITION: # port flush every 3 months # follow up in 6 months/labs- cbc/cmp/ca-125/urine cytology; CT scan c/a/p prior---Dr.B

## 2020-11-28 ENCOUNTER — Ambulatory Visit (INDEPENDENT_AMBULATORY_CARE_PROVIDER_SITE_OTHER): Payer: Medicare Other | Admitting: Gastroenterology

## 2020-11-28 ENCOUNTER — Other Ambulatory Visit: Payer: Self-pay

## 2020-11-28 VITALS — BP 109/72 | HR 71 | Ht 63.0 in | Wt 159.0 lb

## 2020-11-28 DIAGNOSIS — Z1509 Genetic susceptibility to other malignant neoplasm: Secondary | ICD-10-CM | POA: Diagnosis not present

## 2020-11-28 DIAGNOSIS — F17200 Nicotine dependence, unspecified, uncomplicated: Secondary | ICD-10-CM | POA: Diagnosis not present

## 2020-11-28 DIAGNOSIS — K581 Irritable bowel syndrome with constipation: Secondary | ICD-10-CM | POA: Diagnosis not present

## 2020-11-28 DIAGNOSIS — R070 Pain in throat: Secondary | ICD-10-CM

## 2020-11-28 DIAGNOSIS — Z7689 Persons encountering health services in other specified circumstances: Secondary | ICD-10-CM | POA: Diagnosis not present

## 2020-11-28 NOTE — Progress Notes (Signed)
Jonathon Bellows MD, MRCP(U.K) [address]  [place]  [place], [place] [address]  Main: [phone]  Fax: [phone]   Primary Care Physician: Philmore Pali, NP  Primary Gastroenterologist:  Dr. Jonathon Bellows   No chief complaint on file.   HPI: KIMYA MCCAHILL is a 60 y.o. female   Summary of history :  Initially  seen in February 2020 for dysphagia and Lynch syndrome. She has a history of endometrial cancer, colon cancer, GERD. She follows with Dr. Ardyth Man oncology. [facility] Carries a history of dysphagia, GERD and CA of her colon,status post right hemicolectomy. History of stage IV uterine cancer. Colon polyp resected in October 2016 showed an adenoma with focal high-grade dysplasia.  10/24/18: H pylori - negative  11/03/2018: VOZ:DGUYQI duodenal . Gastric bx shows mild chronic gastritis .  Colonoscopy: Hyperplastic rectal polyp.  12/25/2019: Capsule study of the small bowel showed a few tiny polyps in the proximal small bowel were noted. Probably 2 to 3 mm in size. A few lesions suggestive of small bowel AVMs were seen in the distal small bowel. Nonbleeding. 02/12/2020: Push enteroscopy polyps in the first portion of the duodenum were noted 2 to 4 mm in size.  Resected and retrieved.  Biopsy report demonstrated peptic duodenitis  Interval history 10/21/2020-11/28/2020 10/28/2020: CT scan of the abdomen and pelvis with contrast no evidence of recurring malignancy   She has not yet had a skin appointment to have an annual skin exam.   Last visit I commenced her on Trulance as Amitiza was not helping her with her IBS constipation-like symptoms.  The Trulance works very well but she has accidents with that she has been taking it daily after meals.  She has not yet stop smoking.  Did not obtain any urine exam the last visit has had some pain on the right side of throat when she swallows for the past few  months.   Current Outpatient Medications  Medication Sig Dispense Refill  . albuterol (PROVENTIL HFA;VENTOLIN HFA) 108 (90 Base) MCG/ACT inhaler Inhale 2 puffs into the lungs every 2 (two) hours as needed for wheezing or shortness of breath (cough). 1 Inhaler 3  . albuterol (PROVENTIL) (2.5 MG/3ML) 0.083% nebulizer solution Take 3 mLs (2.5 mg total) by nebulization every 6 (six) hours as needed for wheezing or shortness of breath. 75 mL 12  . ALPRAZolam (XANAX) 0.25 MG tablet Take 0.125 mg by mouth daily as needed for anxiety.     Marland Kitchen amoxicillin-clavulanate (AUGMENTIN) 875-125 MG tablet Take 1 tablet by mouth 2 (two) times daily. 14 tablet 0  . buPROPion (WELLBUTRIN SR) 150 MG 12 hr tablet Take 1 tablet by mouth daily.    . chlorpheniramine-HYDROcodone (TUSSIONEX PENNKINETIC ER) 10-8 MG/5ML SUER Take 5 mLs by mouth at bedtime as needed for cough. 140 mL 0  . citalopram (CELEXA) 40 MG tablet Take 40 mg by mouth at bedtime.  2  . DEXILANT 60 MG capsule TAKE 1 CAPSULE BY MOUTH EVERY DAY (Patient not taking: Reported on 10/21/2020) 90 capsule 3  . dicyclomine (BENTYL) 10 MG capsule Take 1 capsule (10 mg total) by mouth 4 (four) times daily -  before meals and at bedtime. 120 capsule 2  . gabapentin (NEURONTIN) 300 MG capsule Take 300 mg by mouth 2 (two) times daily.    Marland Kitchen loratadine (CLARITIN) 10 MG tablet Take 10 mg by mouth daily as needed for allergies.     Marland Kitchen lubiprostone (AMITIZA) 8  MCG capsule Take 1 capsule (8 mcg total) by mouth 2 (two) times daily with a meal. Take medication with a meal to avoid nausea. (Patient taking differently: Take 8 mcg by mouth 2 (two) times daily with a meal. Take medication with a meal to avoid nausea. Pt taking daily) 60 capsule 11  . LUMIGAN 0.01 % SOLN Place 1 drop into the right eye at bedtime.    Marland Kitchen LYRICA 75 MG capsule TAKE 1 CAPSULE BY MOUTH EVERYDAY AT BEDTIME 30 capsule 2  . meloxicam (MOBIC) 15 MG tablet Take 1 tablet (15 mg total) by mouth daily. 30 tablet 0   . Naphazoline-Pheniramine (ALLERGY EYE OP) Place 1-2 drops into both eyes 2 (two) times daily as needed (ITCHY, WATERY EYES).    Marland Kitchen omeprazole (PRILOSEC) 40 MG capsule Take 1 capsule (40 mg total) by mouth daily. 90 capsule 0  . ondansetron (ZOFRAN) 8 MG tablet Take 8 mg by mouth daily as needed (CAR SICKNESS).    Marland Kitchen PAZEO 0.7 % SOLN Place 1 drop into both eyes every morning.    Marland Kitchen SIMBRINZA 1-0.2 % SUSP Apply 1 drop to eye 2 (two) times daily.    . sodium chloride (OCEAN) 0.65 % SOLN nasal spray Place 1 spray into both nostrils as needed for congestion.    . sucralfate (CARAFATE) 1 GM/10ML suspension Take 10 mLs (1 g total) by mouth 4 (four) times daily. 420 mL 1  . traMADol (ULTRAM) 50 MG tablet TAKE 1 TABLET BY MOUTH DAILY AS NEEDED. 30 tablet 0  . traZODone (DESYREL) 50 MG tablet Take 50 mg by mouth at bedtime.     No current facility-administered medications for this visit.    Allergies as of 11/28/2020 - Review Complete 10/31/2020  Allergen Reaction Noted  . Codeine Rash 08/25/2011    ROS:  General: Negative for anorexia, weight loss, fever, chills, fatigue, weakness. ENT: Negative for hoarseness, difficulty swallowing , nasal congestion. CV: Negative for chest pain, angina, palpitations, dyspnea on exertion, peripheral edema.  Respiratory: Negative for dyspnea at rest, dyspnea on exertion, cough, sputum, wheezing.  GI: See history of present illness. GU:  Negative for dysuria, hematuria, urinary incontinence, urinary frequency, nocturnal urination.  Endo: Negative for unusual weight change.    Physical Examination:   BP 109/72   Pulse 71   Ht 5\' 3"  (1.6 m)   Wt 159 lb (72.1 kg)   BMI 28.17 kg/m   General: Well-nourished, well-developed in no acute distress.  Eyes: No icterus. Conjunctivae pink. Extremities: No lower extremity edema. No clubbing or deformities. Neuro: Alert and oriented x 3.  Grossly intact. Skin: Warm and dry, no jaundice.   Psych: Alert and  cooperative, normal mood and affect.   Imaging Studies: DG Chest 2 View  Result Date: 10/31/2020 CLINICAL DATA:  COVID 19 positive yesterday, chest heaviness, cough, history of endometrial and colorectal cancer EXAM: CHEST - 2 VIEW COMPARISON:  10/14/2019, 10/28/2020 FINDINGS: Frontal and lateral views of the chest demonstrate an unremarkable cardiac silhouette. No airspace disease, effusion, or pneumothorax. Stable scarring at the left base. No acute bony abnormalities. Stable right chest wall port. IMPRESSION: 1. No acute intrathoracic process. Electronically Signed   By: Randa Ngo M.D.   On: 10/31/2020 19:59    Assessment and Plan:   FELITA BUMP is a 60 y.o. y/o female here to follow up for abdominal pain.  She has a history ofLynch syndrome. History of colon cancer with right hemicolectomy, uterine cancer. Recent abdominal discomfort  which is suggestive of IBS constipation likely was secondary to use of tramadol and likely abdominal adhesions from prior surgery.  1. Stop smoking will refer to smoking cessation clinic 2.  Requires annual skin exam to screen for skin cancer.Did not obtain the same after last visit will refer again. 3.  Refer to ENT for pain in the right side of her throat when she swallows.  EGD a year back showed no abnormalities 4.  Continue Trulance for constipation suggest to take it every few days 5. Annual urine analysis- will check this visitwas ordered last visit but not obtained 6. Colonoscopy every 2 to 3 years that has been scheduled in a few weeks    Dr Jonathon Bellows  MD,MRCP [facility]) Follow up in 6 months

## 2020-11-28 NOTE — Addendum Note (Signed)
Addended by: Dorethea Clan on: 11/28/2020 03:11 PM   Modules accepted: Orders

## 2020-11-29 LAB — URINALYSIS
Bilirubin, UA: NEGATIVE
Glucose, UA: NEGATIVE
Ketones, UA: NEGATIVE
Nitrite, UA: NEGATIVE
Protein,UA: NEGATIVE
RBC, UA: NEGATIVE
Specific Gravity, UA: 1.017 (ref 1.005–1.030)
Urobilinogen, Ur: 1 mg/dL (ref 0.2–1.0)
pH, UA: 6 (ref 5.0–7.5)

## 2020-12-05 ENCOUNTER — Other Ambulatory Visit: Payer: Medicare Other

## 2020-12-09 ENCOUNTER — Encounter: Payer: Self-pay | Admitting: Gastroenterology

## 2020-12-09 ENCOUNTER — Encounter
Admission: RE | Disposition: A | Discharge status: Home / Self Care | Payer: Self-pay | Source: Home / Self Care | Attending: Gastroenterology

## 2020-12-09 ENCOUNTER — Ambulatory Visit: Payer: Medicare Other | Admitting: Anesthesiology

## 2020-12-09 ENCOUNTER — Ambulatory Visit
Admission: RE | Admit: 2020-12-09 | Discharge: 2020-12-09 | Disposition: A | Discharge status: Home / Self Care | Payer: Medicare Other | Attending: Gastroenterology | Admitting: Gastroenterology

## 2020-12-09 ENCOUNTER — Other Ambulatory Visit: Payer: Self-pay

## 2020-12-09 DIAGNOSIS — F17210 Nicotine dependence, cigarettes, uncomplicated: Secondary | ICD-10-CM | POA: Diagnosis not present

## 2020-12-09 DIAGNOSIS — Z79899 Other long term (current) drug therapy: Secondary | ICD-10-CM | POA: Diagnosis not present

## 2020-12-09 DIAGNOSIS — Z809 Family history of malignant neoplasm, unspecified: Secondary | ICD-10-CM

## 2020-12-09 DIAGNOSIS — Z1509 Genetic susceptibility to other malignant neoplasm: Secondary | ICD-10-CM | POA: Diagnosis not present

## 2020-12-09 DIAGNOSIS — K219 Gastro-esophageal reflux disease without esophagitis: Secondary | ICD-10-CM | POA: Diagnosis not present

## 2020-12-09 DIAGNOSIS — Z791 Long term (current) use of non-steroidal anti-inflammatories (NSAID): Secondary | ICD-10-CM | POA: Diagnosis not present

## 2020-12-09 DIAGNOSIS — Z1211 Encounter for screening for malignant neoplasm of colon: Secondary | ICD-10-CM | POA: Insufficient documentation

## 2020-12-09 DIAGNOSIS — Z8542 Personal history of malignant neoplasm of other parts of uterus: Secondary | ICD-10-CM | POA: Diagnosis not present

## 2020-12-09 DIAGNOSIS — Z885 Allergy status to narcotic agent status: Secondary | ICD-10-CM | POA: Diagnosis not present

## 2020-12-09 DIAGNOSIS — Z9221 Personal history of antineoplastic chemotherapy: Secondary | ICD-10-CM | POA: Diagnosis not present

## 2020-12-09 DIAGNOSIS — J449 Chronic obstructive pulmonary disease, unspecified: Secondary | ICD-10-CM | POA: Diagnosis not present

## 2020-12-09 DIAGNOSIS — D649 Anemia, unspecified: Secondary | ICD-10-CM | POA: Diagnosis not present

## 2020-12-09 DIAGNOSIS — Z85038 Personal history of other malignant neoplasm of large intestine: Secondary | ICD-10-CM | POA: Diagnosis not present

## 2020-12-09 DIAGNOSIS — R1310 Dysphagia, unspecified: Secondary | ICD-10-CM | POA: Diagnosis not present

## 2020-12-09 HISTORY — PX: COLONOSCOPY WITH PROPOFOL: SHX5780

## 2020-12-09 SURGERY — COLONOSCOPY WITH PROPOFOL
Anesthesia: General

## 2020-12-09 MED ORDER — PROPOFOL 10 MG/ML IV BOLUS
INTRAVENOUS | Status: DC | PRN
  Administered 2020-12-09: 50 mg via INTRAVENOUS

## 2020-12-09 MED ORDER — PROPOFOL 500 MG/50ML IV EMUL
INTRAVENOUS | Status: AC
  Filled 2020-12-09: qty 50

## 2020-12-09 MED ORDER — PROPOFOL 500 MG/50ML IV EMUL
INTRAVENOUS | Status: DC | PRN
  Administered 2020-12-09: 150 ug/kg/min via INTRAVENOUS

## 2020-12-09 MED ORDER — LIDOCAINE HCL (CARDIAC) PF 100 MG/5ML IV SOSY
PREFILLED_SYRINGE | INTRAVENOUS | Status: DC | PRN
  Administered 2020-12-09: 50 mg via INTRAVENOUS

## 2020-12-09 MED ORDER — SODIUM CHLORIDE 0.9 % IV SOLN
INTRAVENOUS | Status: DC
  Administered 2020-12-09: 20 mL/h via INTRAVENOUS

## 2020-12-09 NOTE — Transfer of Care (Signed)
Immediate Anesthesia Transfer of Care Note  Patient: Julia Osborne  Procedure(s) Performed: COLONOSCOPY WITH PROPOFOL (N/A )  Patient Location: PACU  Anesthesia Type:General  Level of Consciousness: sedated  Airway & Oxygen Therapy: Patient Spontanous Breathing and Patient connected to nasal cannula oxygen  Post-op Assessment: Report given to RN and Post -op Vital signs reviewed and stable  Post vital signs: Reviewed and stable  Last Vitals:  Vitals Value Taken Time  BP 108/67 12/09/20 0833  Temp    Pulse 61 12/09/20 0833  Resp 15 12/09/20 0833  SpO2 96 % 12/09/20 0833  Vitals shown include unvalidated device data.  Last Pain:  Vitals:   12/09/20 0833  TempSrc:   PainSc: 0-No pain         Complications: No complications documented.

## 2020-12-09 NOTE — Anesthesia Preprocedure Evaluation (Signed)
Anesthesia Evaluation  Patient identified by MRN, date of birth, ID band Patient awake    Reviewed: Allergy & Precautions, H&P , NPO status , Patient's Chart, lab work & pertinent test results, reviewed documented beta blocker date and time   History of Anesthesia Complications (+) PONV and history of anesthetic complications  Airway Mallampati: I  TM Distance: >3 FB Neck ROM: full    Dental  (+) Dental Advidsory Given, Poor Dentition   Pulmonary shortness of breath and with exertion, asthma , neg sleep apnea, COPD, neg recent URI, Current Smoker and Patient abstained from smoking.,           Cardiovascular Exercise Tolerance: Good negative cardio ROS       Neuro/Psych PSYCHIATRIC DISORDERS Anxiety Depression negative neurological ROS     GI/Hepatic Neg liver ROS, GERD  ,  Endo/Other  negative endocrine ROS  Renal/GU negative Renal ROS  negative genitourinary   Musculoskeletal   Abdominal   Peds  Hematology  (+) Blood dyscrasia, anemia ,   Anesthesia Other Findings Past Medical History: 09/13/2015: Adenocarcinoma of colon ([place])     Comment:  Partial colon resection and chemo tx's.  No date: Anemia No date: Anxiety No date: Asthma No date: Cancer [facility])     Comment:  endometrial; cancer cells in intestine No date: COPD (chronic obstructive pulmonary disease) (H[facility])     Comment:  no definite diagnosis No date: Depression No date: Diverticulitis 05/16/2015: Dyspareunia No date: Family history of cancer No date: Family history of kidney cancer No date: GERD (gastroesophageal reflux disease) No date: Glaucoma No date: Indigestion No date: Lynch syndrome No date: Neuropathy     Comment:  feet and hands No date: Uterine fibroid 05/16/2015: Vaginal dryness 07/16/2015: Vaginal itching No date: Vaginal Pap smear, abnormal   Reproductive/Obstetrics negative OB ROS                              Anesthesia Physical  Anesthesia Plan  ASA: III  Anesthesia Plan: General   Post-op Pain Management:    Induction: Intravenous  PONV Risk Score and Plan: 2 and Propofol infusion and TIVA  Airway Management Planned: Natural Airway and Nasal Cannula  Additional Equipment:   Intra-op Plan:   Post-operative Plan:   Informed Consent: I have reviewed the patients History and Physical, chart, labs and discussed the procedure including the risks, benefits and alternatives for the proposed anesthesia with the patient or authorized representative who has indicated his/her understanding and acceptance.     Dental Advisory Given  Plan Discussed with: Anesthesiologist, CRNA and Surgeon  Anesthesia Plan Comments:         Anesthesia Quick Evaluation

## 2020-12-09 NOTE — Anesthesia Postprocedure Evaluation (Signed)
Anesthesia Post Note  Patient: MANALI MCELMURRY  Procedure(s) Performed: COLONOSCOPY WITH PROPOFOL (N/A )  Patient location during evaluation: Endoscopy Anesthesia Type: General Level of consciousness: awake and alert Pain management: pain level controlled Vital Signs Assessment: post-procedure vital signs reviewed and stable Respiratory status: spontaneous breathing, nonlabored ventilation, respiratory function stable and patient connected to nasal cannula oxygen Cardiovascular status: blood pressure returned to baseline and stable Postop Assessment: no apparent nausea or vomiting Anesthetic complications: no   No complications documented.   Last Vitals:  Vitals:   12/09/20 0853 12/09/20 0903  BP: 121/71 138/76  Pulse: 61 62  Resp: 16 16  Temp:    SpO2: 100% 99%    Last Pain:  Vitals:   12/09/20 0903  TempSrc:   PainSc: 0-No pain                 Martha Clan

## 2020-12-09 NOTE — Anesthesia Procedure Notes (Signed)
Date/Time: 12/09/2020 8:22 AM Performed by: Johnna Acosta, CRNA Pre-anesthesia Checklist: Patient identified, Emergency Drugs available, Suction available, Patient being monitored and Timeout performed Patient Re-evaluated:Patient Re-evaluated prior to induction Oxygen Delivery Method: Nasal cannula Preoxygenation: Pre-oxygenation with 100% oxygen Induction Type: IV induction

## 2020-12-09 NOTE — H&P (Signed)
Jonathon Bellows, MD [address], [place], [place], [place], [address] [address], [place], [place], [place], [address] Phone: [phone]  Fax: [phone]  Primary Care Physician:  Philmore Pali, NP   Pre-Procedure History & Physical: HPI:  Julia Osborne is a 60 y.o. female is here for an colonoscopy.   Past Medical History:  Diagnosis Date  . Adenocarcinoma of colon ([place]) 09/13/2015   Partial colon resection and chemo tx's.   . Anemia   . Anxiety   . Asthma   . Cancer ([place])    endometrial; cancer cells in intestine  . COPD (chronic obstructive pulmonary disease) ([place])    no definite diagnosis  . Depression   . Diverticulitis   . Dyspareunia 05/16/2015  . Family history of cancer   . Family history of kidney cancer   . GERD (gastroesophageal reflux disease)   . Glaucoma   . Indigestion   . Lynch syndrome   . Neuropathy    feet and hands  . Uterine fibroid   . Vaginal dryness 05/16/2015  . Vaginal itching 07/16/2015  . Vaginal Pap smear, abnormal     Past Surgical History:  Procedure Laterality Date  . ABDOMINAL HYSTERECTOMY    . APPENDECTOMY    . BIOPSY N/A 03/14/2015   Procedure: BIOPSY;  Surgeon: Daneil Dolin, MD;  Location: [facility];  Service: Endoscopy;  Laterality: N/A;  Gastric  . COLONOSCOPY N/A 01/28/2017   Procedure: COLONOSCOPY;  Surgeon: Daneil Dolin, MD;  Location: [facility] ENDO SU[facility];  Service: Endoscopy;  Laterality: N/A;  11:30am  . COLONOSCOPY WITH PROPOFOL N/A 03/14/2015   RMR: Internal hemorrhoids. colonic diverticulosis. Incomplete examination. Prepartation inadequate.  . COLONOSCOPY WITH PROPOFOL N/A 07/04/2015   RMR: Colonic diverticulosis . Large polypoid lesion in the vicinity of the hepatic flexure status post saline-assisted piecmeal snare polypectomy  with ablation and tattooing as described. Sigmoid polyp removed as described above. sigmoid colon polyp hyperplastic, hepatic flexure polyp with TA with focal high grade dysplasia   .  COLONOSCOPY WITH PROPOFOL N/A 11/03/2018   Procedure: COLONOSCOPY WITH PROPOFOL;  Surgeon: Jonathon Bellows, MD;  Location: [facility] ENDOSCOPY;  Service: Gastroenterology;  Laterality: N/A;  . ENTEROSCOPY N/A 02/12/2020   Procedure: ENTEROSCOPY;  Surgeon: Jonathon Bellows, MD;  Location: [facility] ENDOSCOPY;  Service: Gastroenterology;  Laterality: N/A;  . ESOPHAGEAL DILATION N/A 03/14/2015   Procedure: ESOPHAGEAL DILATION;  Surgeon: Daneil Dolin, MD;  Location: [facility];  Service: Endoscopy;  Laterality: N/A;  [place] [address]  . ESOPHAGOGASTRODUODENOSCOPY N/A 05/19/2016   Procedure: ESOPHAGOGASTRODUODENOSCOPY (EGD);  Surgeon: Daneil Dolin, MD;  [facility];  Service: Endoscopy;  Laterality: N/A;  215  . ESOPHAGOGASTRODUODENOSCOPY N/A 01/28/2017   Procedure: ESOPHAGOGASTRODUODENOSCOPY (EGD);  Surgeon: Daneil Dolin, MD;  [facility];  Service: Endoscopy;  Laterality: N/A;  . ESOPHAGOGASTRODUODENOSCOPY (EGD) WITH PROPOFOL N/A 03/14/2015   RMR: Mild erosive reflux esophagitis status post passage o f a [place] dilator. Abnormal gastric mucosa of uncertain significance as described above. status post biopsy, benign  . ESOPHAGOGASTRODUODENOSCOPY (EGD) WITH PROPOFOL N/A 11/18/2017   Procedure: ESOPHAGOGASTRODUODENOSCOPY (EGD) WITH PROPOFOL;  Surgeon: Daneil Dolin, MD;  [facility];  Service: Endoscopy;  Laterality: N/A;  12:15pm  . ESOPHAGOGASTRODUODENOSCOPY (EGD) WITH PROPOFOL N/A 11/03/2018   Procedure: ESOPHAGOGASTRODUODENOSCOPY (EGD) WITH PROPOFOL;  Surgeon: Jonathon Bellows, MD;  Location[facility] ENDOSCOPY;  Service: Gastroenterology;  Laterality: N/A;  . GIVENS CAPSULE STUDY N/A 12/25/2019   Procedure: GIVENS CAPSULE  STUDY;  Surgeon: Jonathon Bellows, MD;  Location: [facility] ENDOSCOPY;  Service: Gastroenterology;  Laterality: N/A;  . [name] DILATION N/A 05/19/2016   Procedure: [name] DILATION;  Surgeon: Daneil Dolin, MD;  [facility];  Service: Endoscopy;  Laterality: N/A;  . [name]  DILATION N/A 01/28/2017   Procedure: [name] DILATION;  Surgeon: Daneil Dolin, MD;  [facility];  Service: Endoscopy;  Laterality: N/A;  [name] DILATION N/A 11/18/2017   Procedure: [name] DILATION;  Surgeon: Daneil Dolin, MD;  [facility];  Service: Endoscopy;  Laterality: N/A;  . PARTIAL COLECTOMY  08/30/2015   polyp with adenocarcinoma  . POLYPECTOMY N/A 07/04/2015   Procedure: POLYPECTOMY;  Surgeon: Daneil Dolin, MD;  Location: [facility];  Service: Endoscopy;  Laterality: N/A;  . PORTACATH PLACEMENT Right 09/2014  . TUBAL LIGATION      Prior to Admission medications   Medication Sig Start Date End Date Taking? Authorizing Provider  albuterol (PROVENTIL HFA;VENTOLIN HFA) 108 (90 Base) MCG/ACT inhaler Inhale 2 puffs into the lungs every 2 (two) hours as needed for wheezing or shortness of breath (cough). 10/17/15  Yes Annitta Needs, NP  albuterol (PROVENTIL) (2.5 MG/3ML) 0.083% nebulizer solution Take 3 mLs (2.5 mg total) by nebulization every 6 (six) hours as needed for wheezing or shortness of breath. 01/16/16  Yes Penland, Kelby Fam, MD  ALPRAZolam Duanne Moron) 0.25 MG tablet Take 0.125 mg by mouth daily as needed for anxiety.    Yes [provider]  amoxicillin-clavulanate (AUGMENTIN) 875-125 MG tablet Take 1 tablet by mouth 2 (two) times daily. 06/19/20  Yes Cuthriell, Charline Bills, PA-C  buPROPion (WELLBUTRIN SR) 150 MG 12 hr tablet Take 1 tablet by mouth daily. 07/25/18  Yes [provider]  chlorpheniramine-HYDROcodone (TUSSIONEX PENNKINETIC ER) 10-8 MG/5ML SUER Take 5 mLs by mouth at bedtime as needed for cough. 10/31/20  Yes Blake Divine, MD  citalopram (CELEXA) 40 MG tablet Take 40 mg by mouth at bedtime. 01/05/17  Yes [provider]  dicyclomine (BENTYL) 10 MG capsule Take 1 capsule (10 mg total) by mouth 4 (four) times daily -  before meals and at bedtime. 01/29/20  Yes Jonathon Bellows, MD  gabapentin (NEURONTIN) 300 MG capsule Take 300 mg  by mouth 2 (two) times daily. 03/10/19  Yes [provider]  loratadine (CLARITIN) 10 MG tablet Take 10 mg by mouth daily as needed for allergies.    Yes [provider]  LUMIGAN 0.01 % SOLN Place 1 drop into the right eye at bedtime. 06/24/18  Yes [provider]  LYRICA 75 MG capsule TAKE 1 CAPSULE BY MOUTH EVERYDAY AT BEDTIME 10/24/17  Yes Holley Bouche, NP  meloxicam (MOBIC) 15 MG tablet Take 1 tablet (15 mg total) by mouth daily. 06/19/20  Yes Cuthriell, Charline Bills, PA-C  Naphazoline-Pheniramine (ALLERGY EYE OP) Place 1-2 drops into both eyes 2 (two) times daily as needed (ITCHY, WATERY EYES).   Yes [provider]  omeprazole (PRILOSEC) 40 MG capsule Take 1 capsule (40 mg total) by mouth daily. 09/11/20  Yes Jonathon Bellows, MD  ondansetron (ZOFRAN) 8 MG tablet Take 8 mg by mouth daily as needed (CAR SICKNESS).   Yes [provider]  PAZEO 0.7 % SOLN Place 1 drop into both eyes every morning. 06/27/18  Yes [provider]  SIMBRINZA 1-0.2 % SUSP Apply 1 drop to eye 2 (two) times daily. 04/04/19  Yes [provider]  sodium chloride (OCEAN) 0.65 %  SOLN nasal spray Place 1 spray into both nostrils as needed for congestion.   Yes [provider]  sucralfate (CARAFATE) 1 GM/10ML suspension Take 10 mLs (1 g total) by mouth 4 (four) times daily. 10/24/18  Yes Jonathon Bellows, MD  traMADol (ULTRAM) 50 MG tablet TAKE 1 TABLET BY MOUTH DAILY AS NEEDED. 06/07/19  Yes Verlon Au, NP  traZODone (DESYREL) 50 MG tablet Take 50 mg by mouth at bedtime.   Yes [provider]  DEXILANT 60 MG capsule TAKE 1 CAPSULE BY MOUTH EVERY DAY Patient not taking: Reported on 10/21/2020 02/24/19   Carlis Stable, NP  lubiprostone (AMITIZA) 8 MCG capsule Take 1 capsule (8 mcg total) by mouth 2 (two) times daily with a meal. Take medication with a meal to avoid nausea. Patient taking differently: Take 8 mcg by mouth 2 (two) times daily with a meal. Take  medication with a meal to avoid nausea. Pt taking daily 01/13/19   Rourk, Cristopher Estimable, MD    Allergies as of 10/21/2020 - Review Complete 10/21/2020  Allergen Reaction Noted  . Codeine Rash 08/25/2011    Family History  Problem Relation Age of Onset  . Other Mother        clot that went to heart, deceased age 21ss  . Heart attack Father        age 53s, deceased  . Stroke Father   . Cancer Father        "at death determined he was ate up with cancer"  . Hypertension Sister   . Kidney cancer Sister 37  . Diabetes Brother   . Hypertension Brother   . Endometriosis Daughter   . Heart attack Maternal Grandfather   . Diabetes Sister   . Brain cancer Paternal Aunt   . Cancer Paternal Uncle        NOS  . Cancer Paternal Uncle        NOS  . Cancer Paternal Uncle        NOS  . Colon cancer Neg Hx     Social History   Socioeconomic History  . Marital status: Married    Spouse name: Not on file  . Number of children: 2  . Years of education: Not on file  . Highest education level: Not on file  Occupational History  . Not on file  Tobacco Use  . Smoking status: Current Every Day Smoker    Packs/day: 2.00    Years: 25.00    Pack years: 50.00    Types: Cigarettes  . Smokeless tobacco: Never Used  . Tobacco comment: Is trying to quit/wean off.  Vaping Use  . Vaping Use: Never used  Substance and Sexual Activity  . Alcohol use: No  . Drug use: No  . Sexual activity: Not Currently    Birth control/protection: Surgical    Comment: hyst  Other Topics Concern  . Not on file  Social History Narrative   #  [place]ives [place]; smoking- 1/2 ppd; no alcohol; used to run machines;       FHx-Dad- MI; ? Cancer on autopsy; sisters/aunts- brain; lung cancer x2; oldest sister- kidney cancer      Pt has 4 year old son;daughter 80 years. Brothers- states to have spoken to her family re: importance of them being checked for lynch.    Social Determinants of Health   Financial  Resource Strain: Not on file  Food Insecurity: Not on file  Transportation Needs: Not on file  Physical Activity:  Not on file  Stress: Not on file  Social Connections: Not on file  Intimate Partner Violence: Not on file    Review of Systems: See HPI, otherwise negative ROS  Physical Exam: BP 138/79   Pulse (!) 58   Temp (!) 96.4 F (35.8 C) (Temporal)   Resp 20   Ht 5\' 3"  (1.6 m)   Wt 72.1 kg   SpO2 100%   BMI 28.17 kg/m  General:   Alert,  pleasant and cooperative in NAD Head:  Normocephalic and atraumatic. Neck:  Supple; no masses or thyromegaly. Lungs:  Clear throughout to auscultation, normal respiratory effort.    Heart:  +S1, +S2, Regular rate and rhythm, No edema. Abdomen:  Soft, nontender and nondistended. Normal bowel sounds, without guarding, and without rebound.   Neurologic:  Alert and  oriented x4;  grossly normal neurologically.  Impression/Plan: Julia Osborne is here for an colonoscopy to be performed for Screening colonoscopy lynch syndrome  Risks, benefits, limitations, and alternatives regarding  colonoscopy have been reviewed with the patient.  Questions have been answered.  All parties agreeable.   Jonathon Bellows, MD  12/09/2020, 8:15 AM

## 2020-12-09 NOTE — Op Note (Signed)
[facility] Patient Name: Julia Osborne Procedure Date: 12/09/2020 8:14 AM MRN: [id number] Account #: [id number] Date of Birth: 12/05/1960 Admit Type: Outpatient Age: 60 Room: [facility] ENDO ROOM 4 Gender: Female Note Status: Finalized Procedure:             Colonoscopy Indications:           Lynch Syndrome Providers:             Jonathon Bellows MD, MD Referring MD:          Philmore Pali (Referring MD) Medicines:             Monitored Anesthesia Care Complications:         No immediate complications. Procedure:             Pre-Anesthesia Assessment:                        - Prior to the procedure, a History and Physical was                         performed, and patient medications, allergies and                         sensitivities were reviewed. The patient's tolerance                         of previous anesthesia was reviewed.                        - The risks and benefits of the procedure and the                         sedation options and risks were discussed with the                         patient. All questions were answered and informed                         consent was obtained.                        - ASA Grade Assessment: II - A patient with mild                         systemic disease.                        After obtaining informed consent, the colonoscope was                         passed under direct vision. Throughout the procedure,                         the patient's blood pressure, pulse, and oxygen                         saturations were monitored continuously. The                         Colonoscope was introduced through the anus with the  intention of advancing to the ileum. The scope was                         advanced to the sigmoid colon before the procedure was                         aborted. Medications were given. The colonoscopy was                         performed with ease. The patient  tolerated the                         procedure well. The quality of the bowel preparation                         was poor. Findings:      The perianal and digital rectal examinations were normal.      A large amount of stool was found in the rectum and in the sigmoid       colon, interfering with visualization. Impression:            - Preparation of the colon was poor.                        - Stool in the rectum and in the sigmoid colon.                        - No specimens collected. Recommendation:        - Discharge patient to home (with escort).                        - Resume previous diet.                        - Continue present medications.                        - Repeat colonoscopy in 2 weeks because the bowel                         preparation was poor. Procedure Code(s):     --- Professional ---                        [id number], 87, Colonoscopy, flexible; diagnostic,                         including collection of specimen(s) by brushing or                         washing, when performed (separate procedure) Diagnosis Code(s):     --- Professional ---                        Z15.09, Genetic susceptibility to other malignant                         neoplasm CPT copyright 2019 American Medical Association. All rights reserved. The codes documented in this report are preliminary and upon coder review may  be revised to meet  current compliance requirements. Jonathon Bellows, MD Jonathon Bellows MD, MD 12/09/2020 8:29:20 AM This report has been signed electronically. Number of Addenda: 0 Note Initiated On: 12/09/2020 8:14 AM Total Procedure Duration: 0 hours 0 minutes 58 seconds  Estimated Blood Loss:  Estimated blood loss: none.      [facility]

## 2020-12-10 ENCOUNTER — Encounter: Payer: Self-pay | Admitting: Gastroenterology

## 2021-01-01 ENCOUNTER — Telehealth: Payer: Self-pay | Admitting: Gastroenterology

## 2021-01-01 NOTE — Telephone Encounter (Signed)
Patient called asks for call back to reschedule her Colonoscopy.

## 2021-01-06 ENCOUNTER — Other Ambulatory Visit: Payer: Self-pay

## 2021-01-06 DIAGNOSIS — Z809 Family history of malignant neoplasm, unspecified: Secondary | ICD-10-CM

## 2021-01-06 MED ORDER — PEG 3350-KCL-NA BICARB-NACL 420 G PO SOLR: 4000.0000 mL | Freq: Once | ORAL | 0 refills | Status: AC

## 2021-01-06 NOTE — Telephone Encounter (Signed)
Procedure has been rescheduled and instructions mailed out.

## 2021-01-06 NOTE — Progress Notes (Signed)
Instructions will be mailed out and procedure has been rescheduled. Pt verbalized understanding.

## 2021-01-07 ENCOUNTER — Other Ambulatory Visit: Payer: Self-pay | Admitting: Gastroenterology

## 2021-01-17 ENCOUNTER — Encounter: Payer: Self-pay | Admitting: Gastroenterology

## 2021-01-20 ENCOUNTER — Encounter: Payer: Self-pay | Admitting: Gastroenterology

## 2021-01-20 ENCOUNTER — Encounter
Admission: RE | Disposition: A | Discharge status: Home / Self Care | Payer: Self-pay | Source: Home / Self Care | Attending: Gastroenterology

## 2021-01-20 ENCOUNTER — Ambulatory Visit: Payer: Medicare Other | Admitting: Anesthesiology

## 2021-01-20 ENCOUNTER — Ambulatory Visit
Admission: RE | Admit: 2021-01-20 | Discharge: 2021-01-20 | Disposition: A | Discharge status: Home / Self Care | Payer: Medicare Other | Attending: Gastroenterology | Admitting: Gastroenterology

## 2021-01-20 DIAGNOSIS — Z808 Family history of malignant neoplasm of other organs or systems: Secondary | ICD-10-CM | POA: Insufficient documentation

## 2021-01-20 DIAGNOSIS — Z980 Intestinal bypass and anastomosis status: Secondary | ICD-10-CM | POA: Insufficient documentation

## 2021-01-20 DIAGNOSIS — Z9221 Personal history of antineoplastic chemotherapy: Secondary | ICD-10-CM | POA: Insufficient documentation

## 2021-01-20 DIAGNOSIS — D125 Benign neoplasm of sigmoid colon: Secondary | ICD-10-CM | POA: Insufficient documentation

## 2021-01-20 DIAGNOSIS — Z8051 Family history of malignant neoplasm of kidney: Secondary | ICD-10-CM | POA: Insufficient documentation

## 2021-01-20 DIAGNOSIS — Z1509 Genetic susceptibility to other malignant neoplasm: Secondary | ICD-10-CM | POA: Diagnosis not present

## 2021-01-20 DIAGNOSIS — K635 Polyp of colon: Secondary | ICD-10-CM | POA: Diagnosis not present

## 2021-01-20 DIAGNOSIS — Z800 Family history of malignant neoplasm of digestive organs: Secondary | ICD-10-CM | POA: Diagnosis not present

## 2021-01-20 DIAGNOSIS — Z9049 Acquired absence of other specified parts of digestive tract: Secondary | ICD-10-CM | POA: Diagnosis not present

## 2021-01-20 DIAGNOSIS — D649 Anemia, unspecified: Secondary | ICD-10-CM | POA: Diagnosis not present

## 2021-01-20 DIAGNOSIS — F17210 Nicotine dependence, cigarettes, uncomplicated: Secondary | ICD-10-CM | POA: Insufficient documentation

## 2021-01-20 DIAGNOSIS — Z1211 Encounter for screening for malignant neoplasm of colon: Secondary | ICD-10-CM | POA: Diagnosis not present

## 2021-01-20 DIAGNOSIS — Z79899 Other long term (current) drug therapy: Secondary | ICD-10-CM | POA: Diagnosis not present

## 2021-01-20 DIAGNOSIS — Z885 Allergy status to narcotic agent status: Secondary | ICD-10-CM | POA: Insufficient documentation

## 2021-01-20 DIAGNOSIS — Z85038 Personal history of other malignant neoplasm of large intestine: Secondary | ICD-10-CM | POA: Insufficient documentation

## 2021-01-20 DIAGNOSIS — Z801 Family history of malignant neoplasm of trachea, bronchus and lung: Secondary | ICD-10-CM | POA: Insufficient documentation

## 2021-01-20 DIAGNOSIS — Z809 Family history of malignant neoplasm, unspecified: Secondary | ICD-10-CM

## 2021-01-20 HISTORY — PX: COLONOSCOPY WITH PROPOFOL: SHX5780

## 2021-01-20 SURGERY — COLONOSCOPY WITH PROPOFOL
Anesthesia: General

## 2021-01-20 MED ORDER — PROPOFOL 10 MG/ML IV BOLUS
INTRAVENOUS | Status: DC | PRN
  Administered 2021-01-20: 60 mg via INTRAVENOUS
  Administered 2021-01-20: 40 mg via INTRAVENOUS

## 2021-01-20 MED ORDER — SODIUM CHLORIDE 0.9 % IV SOLN
INTRAVENOUS | Status: DC
  Administered 2021-01-20: 1000 mL via INTRAVENOUS

## 2021-01-20 MED ORDER — LIDOCAINE 2% (20 MG/ML) 5 ML SYRINGE
INTRAMUSCULAR | Status: DC | PRN
  Administered 2021-01-20: 50 mg via INTRAVENOUS

## 2021-01-20 MED ORDER — PROPOFOL 500 MG/50ML IV EMUL
INTRAVENOUS | Status: AC
  Filled 2021-01-20: qty 50

## 2021-01-20 MED ORDER — PROPOFOL 500 MG/50ML IV EMUL
INTRAVENOUS | Status: DC | PRN
  Administered 2021-01-20: 200 ug/kg/min via INTRAVENOUS

## 2021-01-20 NOTE — H&P (Signed)
Jonathon Bellows, MD [address] [place], [place], [place], [address] [address], [place], [place], [place], [address] Phone: [phone]  Fax: [phone]  Primary Care Physician:  Philmore Pali, NP   Pre-Procedure History & Physical: HPI:  Julia Osborne is a 60 y.o. female is here for an colonoscopy.   Past Medical History:  Diagnosis Date  . Adenocarcinoma of colon ([place]) 09/13/2015   Partial colon resection and chemo tx's.   . Anemia   . Anxiety   . Asthma   . Cancer ([place])    endometrial; cancer cells in intestine  . COPD (chronic obstructive pulmonary disease) ([place])    no definite diagnosis  . Depression   . Diverticulitis   . Dyspareunia 05/16/2015  . Family history of cancer   . Family history of kidney cancer   . GERD (gastroesophageal reflux disease)   . Glaucoma   . Indigestion   . Lynch syndrome   . Neuropathy    feet and hands  . Uterine fibroid   . Vaginal dryness 05/16/2015  . Vaginal itching 07/16/2015  . Vaginal Pap smear, abnormal     Past Surgical History:  Procedure Laterality Date  . ABDOMINAL HYSTERECTOMY    . APPENDECTOMY    . BIOPSY N/A 03/14/2015   Procedure: BIOPSY;  Surgeon: Daneil Dolin, MD;  Location: [facility] ORS;  Service: Endoscopy;  Laterality: N/A;  Gastric  . COLONOSCOPY N/A 01/28/2017   Procedure: COLONOSCOPY;  Surgeon: Daneil Dolin, MD;  Location: [facility] ENDO SUITE;  Service: Endoscopy;  Laterality: N/A;  11:30am  . COLONOSCOPY WITH PROPOFOL N/A 03/14/2015   RMR: Internal hemorrhoids. colonic diverticulosis. Incomplete examination. Prepartation inadequate.  . COLONOSCOPY WITH PROPOFOL N/A 07/04/2015   RMR: Colonic diverticulosis . Large polypoid lesion in the vicinity of the hepatic flexure status post saline-assisted piecmeal snare polypectomy  with ablation and tattooing as described. Sigmoid polyp removed as described above. sigmoid colon polyp hyperplastic, hepatic flexure polyp with TA with focal high grade dysplasia   .  COLONOSCOPY WITH PROPOFOL N/A 11/03/2018   Procedure: COLONOSCOPY WITH PROPOFOL;  Surgeon: Jonathon Bellows, MD;  Location: [facility] ENDOSCOPY;  Service: Gastroenterology;  Laterality: N/A;  . COLONOSCOPY WITH PROPOFOL N/A 12/09/2020   Procedure: COLONOSCOPY WITH PROPOFOL;  Surgeon: Jonathon Bellows, MD;  Location: [facility] ENDOSCOPY;  Service: Gastroenterology;  Laterality: N/A;  COVID POSITIVE 10/30/2020  . ENTEROSCOPY N/A 02/12/2020   Procedure: ENTEROSCOPY;  Surgeon: Jonathon Bellows, MD;  Location: [facility] ENDOSCOPY;  Service: Gastroenterology;  Laterality: N/A;  . ESOPHAGEAL DILATION N/A 03/14/2015   Procedure: ESOPHAGEAL DILATION;  Surgeon: Daneil Dolin, MD;  Location: [facility] ORS;  Service: Endoscopy;  Laterality: N/A;  Maloney 72  . ESOPHAGOGASTRODUODENOSCOPY N/A 05/19/2016   Procedure: ESOPHAGOGASTRODUODENOSCOPY (EGD);  Surgeon: Daneil Dolin, MD;  Location: [facility] ENDO SU[facility];  Service: Endoscopy;  Laterality: N/A;  215  . ESOPHAGOGASTRODUODENOSCOPY N/A 01/28/2017   Procedure: ESOPHAGOGASTRODUODENOSCOPY (EGD);  Surgeon: Daneil Dolin, MD;  [facility]: [facility];  Service: Endoscopy;  Laterality: N/A;  . ESOPHAGOGASTRODUODENOSCOPY (EGD) WITH PROPOFOL N/A 03/14/2015   RMR: Mild erosive reflux esophagitis status post passage o f a Maloney dilator. Abnormal gastric mucosa of uncertain significance as described above. status post biopsy, benign  . ESOPHAGOGASTRODUODENOSCOPY (EGD) WITH PROPOFOL N/A 11/18/2017   Procedure: ESOPHAGOGASTRODUODENOSCOPY (EGD) WITH PROPOFOL;  Surgeon: Daneil Dolin, MD;  [facility];  Service: Endoscopy;  Laterality: N/A;  12:15pm  . ESOPHAGOGASTRODUODENOSCOPY (EGD) WITH PROPOFOL N/A 11/03/2018  Procedure: ESOPHAGOGASTRODUODENOSCOPY (EGD) WITH PROPOFOL;  Surgeon: Jonathon Bellows, MD;  Location: [facility] ENDOSCOPY;  Service: Gastroenterology;  Laterality: N/A;  . GIVENS CAPSULE STUDY N/A 12/25/2019   Procedure: GIVENS CAPSULE STUDY;  Surgeon: Jonathon Bellows, MD;  Location: [facility] ENDOSCOPY;  Service:  Gastroenterology;  Laterality: N/A;  . [name] DILATION N/A 05/19/2016   Procedure: [name] DILATION;  Surgeon: Daneil Dolin, MD;  [facility];  Service: Endoscopy;  Laterality: N/A;  . [name] DILATION N/A 01/28/2017   Procedure: [name] DILATION;  Surgeon: Daneil Dolin, MD;  Location: [facility] ENDO SU[facility];  Service: Endoscopy;  Laterality: N/A;  [name] DILATION N/A 11/18/2017   Procedure: [name] DILATION;  Surgeon: Daneil Dolin, MD;  Location: [facility] ENDO SU[facility];  Service: Endoscopy;  Laterality: N/A;  . PARTIAL COLECTOMY  08/30/2015   polyp with adenocarcinoma  . POLYPECTOMY N/A 07/04/2015   Procedure: POLYPECTOMY;  Surgeon: Daneil Dolin, MD;  Location: [facility] ORS;  Service: Endoscopy;  Laterality: N/A;  . PORTACATH PLACEMENT Right 09/2014  . TUBAL LIGATION      Prior to Admission medications   Medication Sig Start Date End Date Taking? Authorizing Provider  ALPRAZolam (XANAX) 0.25 MG tablet Take 0.125 mg by mouth daily as needed for anxiety.    Yes [provider]  citalopram (CELEXA) 40 MG tablet Take 40 mg by mouth at bedtime. 01/05/17  Yes [provider]  gabapentin (NEURONTIN) 300 MG capsule Take 300 mg by mouth 2 (two) times daily. 03/10/19  Yes [provider]  loratadine (CLARITIN) 10 MG tablet Take 10 mg by mouth daily as needed for allergies.    Yes [provider]  LUMIGAN 0.01 % SOLN Place 1 drop into the right eye at bedtime. 06/24/18  Yes [provider]  Naphazoline-Pheniramine (ALLERGY EYE OP) Place 1-2 drops into both eyes 2 (two) times daily as needed (ITCHY, WATERY EYES).   Yes [provider]  omeprazole (PRILOSEC) 40 MG capsule Take 1 capsule (40 mg total) by mouth daily. 09/11/20  Yes Jonathon Bellows, MD  ondansetron (ZOFRAN) 8 MG tablet Take 8 mg by mouth daily as needed (CAR SICKNESS).   Yes [provider]  PAZEO 0.7 % SOLN Place 1 drop into both eyes every morning. 06/27/18  Yes [provider]   SIMBRINZA 1-0.2 % SUSP Apply 1 drop to eye 2 (two) times daily. 04/04/19  Yes [provider]  sodium chloride (OCEAN) 0.65 % SOLN nasal spray Place 1 spray into both nostrils as needed for congestion.   Yes [provider]  sucralfate (CARAFATE) 1 GM/10ML suspension Take 10 mLs (1 g total) by mouth 4 (four) times daily. 10/24/18  Yes Jonathon Bellows, MD  traMADol (ULTRAM) 50 MG tablet TAKE 1 TABLET BY MOUTH DAILY AS NEEDED. 06/07/19  Yes Verlon Au, NP  traZODone (DESYREL) 50 MG tablet Take 50 mg by mouth at bedtime.   Yes [provider]  albuterol (PROVENTIL HFA;VENTOLIN HFA) 108 (90 Base) MCG/ACT inhaler Inhale 2 puffs into the lungs every 2 (two) hours as needed for wheezing or shortness of breath (cough). 10/17/15   Annitta Needs, NP  albuterol (PROVENTIL) (2.5 MG/3ML) 0.083% nebulizer solution Take 3 mLs (2.5 mg total) by nebulization every 6 (six) hours as needed for wheezing or shortness of breath. 01/16/16   Penland, Kelby Fam, MD  amoxicillin-clavulanate (AUGMENTIN) 875-125 MG tablet Take 1 tablet by mouth 2 (two) times daily. Patient not taking: Reported on 01/20/2021 06/19/20   Cuthriell, Roderic Palau  D, PA-C  buPROPion (WELLBUTRIN SR) 150 MG 12 hr tablet Take 1 tablet by mouth daily. Patient not taking: Reported on 01/20/2021 07/25/18   [provider]  chlorpheniramine-HYDROcodone (TUSSIONEX PENNKINETIC ER) 10-8 MG/5ML SUER Take 5 mLs by mouth at bedtime as needed for cough. 10/31/20   Blake Divine, MD  DEXILANT 60 MG capsule TAKE 1 CAPSULE BY MOUTH EVERY DAY Patient not taking: Reported on 10/21/2020 02/24/19   Carlis Stable, NP  dicyclomine (BENTYL) 10 MG capsule Take 1 capsule (10 mg total) by mouth 4 (four) times daily -  before meals and at bedtime. Patient not taking: Reported on 01/20/2021 01/29/20   Jonathon Bellows, MD  lubiprostone (AMITIZA) 8 MCG capsule Take 1 capsule (8 mcg total) by mouth 2 (two) times daily with a meal. Take medication with a meal to avoid  nausea. Patient not taking: Reported on 01/20/2021 01/13/19   Daneil Dolin, MD  LYRICA 75 MG capsule TAKE 1 CAPSULE BY MOUTH EVERYDAY AT BEDTIME Patient not taking: Reported on 01/20/2021 10/24/17   Holley Bouche, NP  meloxicam (MOBIC) 15 MG tablet Take 1 tablet (15 mg total) by mouth daily. Patient not taking: Reported on 01/20/2021 06/19/20   Cuthriell, Charline Bills, PA-C    Allergies as of 01/07/2021 - Review Complete 12/09/2020  Allergen Reaction Noted  . Codeine Rash 08/25/2011    Family History  Problem Relation Age of Onset  . Other Mother        clot that went to heart, deceased age 42ss  . Heart attack Father        age 93s, deceased  . Stroke Father   . Cancer Father        "at death determined he was ate up with cancer"  . Hypertension Sister   . Kidney cancer Sister 41  . Diabetes Brother   . Hypertension Brother   . Endometriosis Daughter   . Heart attack Maternal Grandfather   . Diabetes Sister   . Brain cancer Paternal Aunt   . Cancer Paternal Uncle        NOS  . Cancer Paternal Uncle        NOS  . Cancer Paternal Uncle        NOS  . Colon cancer Neg Hx     Social History   Socioeconomic History  . Marital status: Married    Spouse name: Not on file  . Number of children: 2  . Years of education: Not on file  . Highest education level: Not on file  Occupational History  . Not on file  Tobacco Use  . Smoking status: Current Every Day Smoker    Packs/day: 2.00    Years: 25.00    Pack years: 50.00    Types: Cigarettes  . Smokeless tobacco: Never Used  . Tobacco comment: Is trying to quit/wean off.  Vaping Use  . Vaping Use: Never used  Substance and Sexual Activity  . Alcohol use: No  . Drug use: No  . Sexual activity: Not Currently    Birth control/protection: Surgical    Comment: hyst  Other Topics Concern  . Not on file  Social History Narrative   #  lives in [place];self; smoking- 1/2 ppd; no alcohol; used to run machines;        FHx-Dad- MI; ? Cancer on autopsy; sisters/aunts- brain; lung cancer x2; oldest sister- kidney cancer      Pt has 71 year old son;daughter 46 years. Brothers- states to have  spoken to her family re: importance of them being checked for lynch.    Social Determinants of Health   Financial Resource Strain: Not on file  Food Insecurity: Not on file  Transportation Needs: Not on file  Physical Activity: Not on file  Stress: Not on file  Social Connections: Not on file  Intimate Partner Violence: Not on file    Review of Systems: See HPI, otherwise negative ROS  Physical Exam: BP (!) 152/67   Pulse (!) 58   Temp (!) 96.9 F (36.1 C) (Temporal)   Resp 16   Ht 5\' 3"  (1.6 m)   Wt 72.7 kg   SpO2 100%   BMI 28.40 kg/m  General:   Alert,  pleasant and cooperative in NAD Head:  Normocephalic and atraumatic. Neck:  Supple; no masses or thyromegaly. Lungs:  Clear throughout to auscultation, normal respiratory effort.    Heart:  +S1, +S2, Regular rate and rhythm, No edema. Abdomen:  Soft, nontender and nondistended. Normal bowel sounds, without guarding, and without rebound.   Neurologic:  Alert and  oriented x4;  grossly normal neurologically.  Impression/Plan: Julia Osborne is here for an colonoscopy to be performed for lynch syndrome   Risks, benefits, limitations, and alternatives regarding  colonoscopy have been reviewed with the patient.  Questions have been answered.  All parties agreeable.   Jonathon Bellows, MD  01/20/2021, 10:45 AM

## 2021-01-20 NOTE — Anesthesia Preprocedure Evaluation (Signed)
Anesthesia Evaluation  Patient identified by MRN, date of birth, ID band Patient awake    Reviewed: Allergy & Precautions, H&P , NPO status , Patient's Chart, lab work & pertinent test results, reviewed documented beta blocker date and time   History of Anesthesia Complications (+) PONV and history of anesthetic complications  Airway Mallampati: I  TM Distance: >3 FB Neck ROM: full    Dental  (+) Dental Advidsory Given, Poor Dentition   Pulmonary shortness of breath and with exertion, asthma , neg sleep apnea, COPD, neg recent URI, Current Smoker and Patient abstained from smoking.,           Cardiovascular Exercise Tolerance: Good negative cardio ROS       Neuro/Psych PSYCHIATRIC DISORDERS Anxiety Depression negative neurological ROS     GI/Hepatic Neg liver ROS, GERD  ,  Endo/Other  negative endocrine ROS  Renal/GU negative Renal ROS  negative genitourinary   Musculoskeletal   Abdominal   Peds  Hematology  (+) Blood dyscrasia, anemia ,   Anesthesia Other Findings Past Medical History: 09/13/2015: Adenocarcinoma of colon ([place])     Comment:  Partial colon resection and chemo tx's.  No date: Anemia No date: Anxiety No date: Asthma No date: Cancer [facility])     Comment:  endometrial; cancer cells in intestine No date: COPD (chronic obstructive pulmonary disease) ([facility])     Comment:  no definite diagnosis No date: Depression No date: Diverticulitis 05/16/2015: Dyspareunia No date: Family history of cancer No date: Family history of kidney cancer No date: GERD (gastroesophageal reflux disease) No date: Glaucoma No date: Indigestion No date: Lynch syndrome No date: Neuropathy     Comment:  feet and hands No date: Uterine fibroid 05/16/2015: Vaginal dryness 07/16/2015: Vaginal itching No date: Vaginal Pap smear, abnormal   Reproductive/Obstetrics negative OB ROS                              Anesthesia Physical  Anesthesia Plan  ASA: III  Anesthesia Plan: General   Post-op Pain Management:    Induction: Intravenous  PONV Risk Score and Plan: 2 and Propofol infusion and TIVA  Airway Management Planned: Natural Airway and Nasal Cannula  Additional Equipment:   Intra-op Plan:   Post-operative Plan:   Informed Consent: I have reviewed the patients History and Physical, chart, labs and discussed the procedure including the risks, benefits and alternatives for the proposed anesthesia with the patient or authorized representative who has indicated his/her understanding and acceptance.     Dental Advisory Given  Plan Discussed with: Anesthesiologist, CRNA and Surgeon  Anesthesia Plan Comments: (Patient consented for risks of anesthesia including but not limited to:  - adverse reactions to medications - risk of airway placement if required - damage to eyes, teeth, lips or other oral mucosa - nerve damage due to positioning  - sore throat or hoarseness - Damage to heart, brain, nerves, lungs, other parts of body or loss of life  Patient voiced understanding.)        Anesthesia Quick Evaluation

## 2021-01-20 NOTE — Op Note (Signed)
[facility] Gastroenterology Patient Name: Julia Osborne Procedure Date: 01/20/2021 10:40 AM MRN: [id number] Account #: [id number] Date of Birth: 1961-02-05 Admit Type: Outpatient Age: 60 Room: [facility] ENDO ROOM 4 Gender: Female Note Status: Finalized Procedure:             Colonoscopy Indications:           Lynch Syndrome Providers:             Jonathon Bellows MD, MD Referring MD:          Philmore Pali (Referring MD) Medicines:             Monitored Anesthesia Care Complications:         No immediate complications. Procedure:             Pre-Anesthesia Assessment:                        - Prior to the procedure, a History and Physical was                         performed, and patient medications, allergies and                         sensitivities were reviewed. The patient's tolerance                         of previous anesthesia was reviewed.                        - The risks and benefits of the procedure and the                         sedation options and risks were discussed with the                         patient. All questions were answered and informed                         consent was obtained.                        - ASA Grade Assessment: II - A patient with mild                         systemic disease.                        After obtaining informed consent, the colonoscope was                         passed under direct vision. Throughout the procedure,                         the patient's blood pressure, pulse, and oxygen                         saturations were monitored continuously. The                         Colonoscope was introduced through the anus and  advanced to the the ileocolonic anastomosis. The                         colonoscopy was performed with ease. The patient                         tolerated the procedure well. The quality of the bowel                         preparation was excellent. Findings:      The  perianal and digital rectal examinations were normal.      A 8 mm polyp was found in the sigmoid colon. The polyp was sessile. The       polyp was removed with a cold snare. Resection and retrieval were       complete.      There was evidence of a prior end-to-side ileo-colonic anastomosis in       the cecum. This was patent and was characterized by healthy appearing       mucosa.      The exam was otherwise without abnormality on direct and retroflexion       views. Impression:            - One 8 mm polyp in the sigmoid colon, removed with a                         cold snare. Resected and retrieved.                        - Patent end-to-side ileo-colonic anastomosis,                         characterized by healthy appearing mucosa.                        - The examination was otherwise normal on direct and                         retroflexion views. Recommendation:        - Discharge patient to home (with escort).                        - Resume previous diet.                        - Continue present medications.                        - Await pathology results.                        - Repeat colonoscopy in 1 year for surveillance. Procedure Code(s):     --- Professional ---                        [phone], Colonoscopy, flexible; with removal of                         tumor(s), polyp(s), or other lesion(s) by snare  technique Diagnosis Code(s):     --- Professional ---                        K63.5, Polyp of colon                        Z98.0, Intestinal bypass and anastomosis status                        Z15.09, Genetic susceptibility to other malignant                         neoplasm CPT copyright 2019 American Medical Association. All rights reserved. The codes documented in this report are preliminary and upon coder review may  be revised to meet current compliance requirements. Jonathon Bellows, MD Jonathon Bellows MD, MD 01/20/2021 11:15:41 AM This report has  been signed electronically. Number of Addenda: 0 Note Initiated On: 01/20/2021 10:40 AM Scope Withdrawal Time: 0 hours 8 minutes 44 seconds  Total Procedure Duration: 0 hours 12 minutes 14 seconds  Estimated Blood Loss:  Estimated blood loss: none.      [facility]

## 2021-01-20 NOTE — Anesthesia Postprocedure Evaluation (Signed)
Anesthesia Post Note  Patient: Julia Osborne  Procedure(s) Performed: COLONOSCOPY WITH PROPOFOL (N/A )  Patient location during evaluation: Endoscopy Anesthesia Type: General Level of consciousness: awake and alert Pain management: pain level controlled Vital Signs Assessment: post-procedure vital signs reviewed and stable Respiratory status: spontaneous breathing, nonlabored ventilation, respiratory function stable and patient connected to nasal cannula oxygen Cardiovascular status: blood pressure returned to baseline and stable Postop Assessment: no apparent nausea or vomiting Anesthetic complications: no   No complications documented.   Last Vitals:  Vitals:   01/20/21 0907 01/20/21 1118  BP: (!) 152/67 (!) 95/55  Pulse: (!) 58   Resp: 16   Temp: (!) 36.1 C (!) 36.1 C  SpO2: 100%     Last Pain:  Vitals:   01/20/21 1148  TempSrc:   PainSc: 0-No pain                 Precious Haws Davione Lenker

## 2021-01-20 NOTE — Transfer of Care (Signed)
Immediate Anesthesia Transfer of Care Note  Patient: Julia Osborne  Procedure(s) Performed: COLONOSCOPY WITH PROPOFOL (N/A )  Patient Location: Endoscopy Unit  Anesthesia Type:General  Level of Consciousness: awake  Airway & Oxygen Therapy: Patient Spontanous Breathing  Post-op Assessment: Post -op Vital signs reviewed and stable  Post vital signs: Reviewed and stable  Last Vitals:  Vitals Value Taken Time  BP 95/55   Temp    Pulse 72 01/20/21 1118  Resp 18 01/20/21 1118  SpO2 99 % 01/20/21 1118  Vitals shown include unvalidated device data.  Last Pain:  Vitals:   01/20/21 0907  TempSrc: Temporal  PainSc: 0-No pain         Complications: No complications documented.

## 2021-01-21 ENCOUNTER — Encounter: Payer: Self-pay | Admitting: Gastroenterology

## 2021-01-21 LAB — SURGICAL PATHOLOGY

## 2021-02-04 DIAGNOSIS — G629 Polyneuropathy, unspecified: Secondary | ICD-10-CM | POA: Diagnosis not present

## 2021-02-04 DIAGNOSIS — J4520 Mild intermittent asthma, uncomplicated: Secondary | ICD-10-CM | POA: Diagnosis not present

## 2021-02-04 DIAGNOSIS — Z79899 Other long term (current) drug therapy: Secondary | ICD-10-CM | POA: Diagnosis not present

## 2021-02-04 DIAGNOSIS — F514 Sleep terrors [night terrors]: Secondary | ICD-10-CM | POA: Diagnosis not present

## 2021-02-04 DIAGNOSIS — E785 Hyperlipidemia, unspecified: Secondary | ICD-10-CM | POA: Diagnosis not present

## 2021-02-12 DIAGNOSIS — E538 Deficiency of other specified B group vitamins: Secondary | ICD-10-CM | POA: Diagnosis not present

## 2021-02-19 DIAGNOSIS — E538 Deficiency of other specified B group vitamins: Secondary | ICD-10-CM | POA: Diagnosis not present

## 2021-02-27 DIAGNOSIS — E538 Deficiency of other specified B group vitamins: Secondary | ICD-10-CM | POA: Diagnosis not present

## 2021-03-04 DIAGNOSIS — E538 Deficiency of other specified B group vitamins: Secondary | ICD-10-CM | POA: Diagnosis not present

## 2021-03-13 ENCOUNTER — Inpatient Hospital Stay (HOSPITAL_BASED_OUTPATIENT_CLINIC_OR_DEPARTMENT_OTHER): Payer: Medicare Other | Admitting: Internal Medicine

## 2021-03-13 ENCOUNTER — Inpatient Hospital Stay: Payer: Medicare Other | Attending: Internal Medicine

## 2021-03-13 ENCOUNTER — Other Ambulatory Visit: Payer: Self-pay

## 2021-03-13 VITALS — BP 129/69 | HR 62 | Temp 98.4°F | Resp 18 | Ht 63.0 in | Wt 174.0 lb

## 2021-03-13 DIAGNOSIS — C541 Malignant neoplasm of endometrium: Secondary | ICD-10-CM

## 2021-03-13 DIAGNOSIS — Z8542 Personal history of malignant neoplasm of other parts of uterus: Secondary | ICD-10-CM | POA: Insufficient documentation

## 2021-03-13 DIAGNOSIS — G629 Polyneuropathy, unspecified: Secondary | ICD-10-CM | POA: Diagnosis not present

## 2021-03-13 DIAGNOSIS — F17210 Nicotine dependence, cigarettes, uncomplicated: Secondary | ICD-10-CM | POA: Diagnosis not present

## 2021-03-13 DIAGNOSIS — Z1509 Genetic susceptibility to other malignant neoplasm: Secondary | ICD-10-CM | POA: Insufficient documentation

## 2021-03-13 DIAGNOSIS — Z95828 Presence of other vascular implants and grafts: Secondary | ICD-10-CM

## 2021-03-13 LAB — COMPREHENSIVE METABOLIC PANEL
ALT: 19 U/L (ref 0–44)
AST: 17 U/L (ref 15–41)
Albumin: 3.8 g/dL (ref 3.5–5.0)
Alkaline Phosphatase: 53 U/L (ref 38–126)
Anion gap: 7 (ref 5–15)
BUN: 8 mg/dL (ref 6–20)
CO2: 28 mmol/L (ref 22–32)
Calcium: 8.7 mg/dL — ABNORMAL LOW (ref 8.9–10.3)
Chloride: 102 mmol/L (ref 98–111)
Creatinine, Ser: 0.54 mg/dL (ref 0.44–1.00)
GFR, Estimated: >60 mL/min (ref >60)
Glucose, Bld: 91 mg/dL (ref 70–99)
Potassium: 4 mmol/L (ref 3.5–5.1)
Sodium: 137 mmol/L (ref 135–145)
Total Bilirubin: 0.5 mg/dL (ref 0.3–1.2)
Total Protein: 7.4 g/dL (ref 6.5–8.1)

## 2021-03-13 LAB — CBC WITH DIFFERENTIAL/PLATELET
Abs Immature Granulocytes: 0.04 10*3/uL (ref 0.00–0.07)
Basophils Absolute: 0.1 10*3/uL (ref 0.0–0.1)
Basophils Relative: 1 %
Eosinophils Absolute: 0.2 10*3/uL (ref 0.0–0.5)
Eosinophils Relative: 2 %
HCT: 38.8 % (ref 36.0–46.0)
Hemoglobin: 13.1 g/dL (ref 12.0–15.0)
Immature Granulocytes: 0 %
Lymphocytes Relative: 31 %
Lymphs Abs: 3.3 10*3/uL (ref 0.7–4.0)
MCH: 30.8 pg (ref 26.0–34.0)
MCHC: 33.8 g/dL (ref 30.0–36.0)
MCV: 91.1 fL (ref 80.0–100.0)
Monocytes Absolute: 1 10*3/uL (ref 0.1–1.0)
Monocytes Relative: 9 %
Neutro Abs: 5.9 10*3/uL (ref 1.7–7.7)
Neutrophils Relative %: 57 %
Platelets: 248 10*3/uL (ref 150–400)
RBC: 4.26 MIL/uL (ref 3.87–5.11)
RDW: 13.3 % (ref 11.5–15.5)
WBC: 10.6 10*3/uL — ABNORMAL HIGH (ref 4.0–10.5)
nRBC: 0 % (ref 0.0–0.2)

## 2021-03-13 MED ORDER — HEPARIN SOD (PORK) LOCK FLUSH 100 UNIT/ML IV SOLN
INTRAVENOUS | Status: AC
  Filled 2021-03-13: qty 5

## 2021-03-13 MED ORDER — HEPARIN SOD (PORK) LOCK FLUSH 100 UNIT/ML IV SOLN
500.0000 [IU] | Freq: Once | INTRAVENOUS | Status: AC
  Administered 2021-03-13: 500 [IU] via INTRAVENOUS
  Filled 2021-03-13: qty 5

## 2021-03-13 MED ORDER — SODIUM CHLORIDE 0.9% FLUSH
10.0000 mL | INTRAVENOUS | Status: DC | PRN
  Administered 2021-03-13 (×2): 10 mL via INTRAVENOUS
  Filled 2021-03-13: qty 10

## 2021-03-13 NOTE — Progress Notes (Signed)
Double Port - Right Port (no blood return), Left Port (blood return)

## 2021-03-13 NOTE — Assessment & Plan Note (Addendum)
#   Uterine Cancer endometrioid type grade 2; stage IV [lung nodules at presentation 2015/]-February 2022- CT scan abdomen pelvis/chest-negative for any obvious recurrence.   STABLE. No clinical evidence of recurrence.   # Lynch syndrome Ozzie Hoyle of high-grade polyp/status post colectomy: Last colonoscopy-May 2022;  repeat on annual basis;  EGD- May 2021--reviewed.STABLE Urine cytology pending.   #Active smoker on Wellbutrin-discussed smoking cessation; DECLINES.   #Peripheral neuropathy grade 2-3; STABLE;  continue tramadol as needed.  # Constipation- on amitizia-STABLE  # # Patient is to unvaccinated to Covid-19 [s/p covid april2022]- DECLINED  # port flush- q 3 M   DISPOSITION: # port flush every 3 months # follow up in 6 months/labs- cbc/cmp/ca-125/urine cytology; Dr.B  # I reviewed the blood work- with the patient in detail; also reviewed the imaging independently [as summarized above]; and with the patient in detail.

## 2021-03-13 NOTE — Progress Notes (Signed)
[place] NOTE  Patient Care Team: Philmore Pali, NP as PCP - General (Nurse Practitioner) Patrici Ranks, MD (Inactive) as PCP - Hematology/Oncology (Hematology and Oncology) Daneil Dolin, MD as Consulting Physician (Gastroenterology) Cammie Sickle, MD as Consulting Physician (Hematology and Oncology)  CHIEF COMPLAINTS/PURPOSE OF CONSULTATION:  Endometrial cancer  #  Oncology History Overview Note  # OCT 2015- Uterine ca- STAGE IV [multiple lung nodules]; Nov 2015S/p radical hysterectomy, BSO, pelvic lymph node dissection/vaginal biopsies/extensive pelvic disease including positive lymph nodes and positive distal vaginal biopsy.]; [[facility]]-endometroid G-2; LVI; positive Pelvic LN/vaginal Bx Jan-April 2016- carbo-Taxol x6 cycles Forestine Na; Dr.Penland]  # Oct 2016- colo- high grade dysplasia [Dr.Rourk; Dr.Jenkins s/p right hemi-colectomy]; last colo- may 2018;    # Genetic testing- [MSH6 mutation]/Lynch syndrome; EGD/colo every 2-3 qyears; urin cytology q6M  Histologic grade G2, histologic type endometrioid adenocarcinoma -----------------------------------------------------  DIAGNOSIS: Endometrioid endometrial cancer  STAGE: IV  ;GOALS: Control/palliative  CURRENT/MOST RECENT THERAPY surveillance    Endometrial cancer ([place])  07/12/2014 Imaging   CT C/A/P, pelvic adenopathy, multiple pulmonary nodules concerning for metastatic disease    07/30/2014 Initial Diagnosis   Endometrial cancer    07/31/2014 Pathology Results   endometrioid adenocarcinoma, G2, lymphovascular invasion present, positive pelvic lymph node and vaginal biopsy    07/31/2014 Definitive Surgery   radical hysterectomy, BSO, pelvic lymph node dissection and vaginal biopsies    09/25/2014 Procedure   Port-A-Cath placement in IR    10/18/2014 - 01/10/2015 Chemotherapy   Carboplatin/Taxol. First cycle given at [facility].  S/P 6 cycles total, 5 cycles given at [facility].     07/04/2015 Procedure   Colonoscopy by Dr. Gala Romney    07/04/2015 Pathology Results   Colon, polyp(s), vicinity of hepatic flexure - TUBULAR ADENOMA WITH FOCAL HIGH GRADE DYSPLASIA.     Procedure   Partial colectomy by Dr. Arnoldo Morale scheduled for 08/30/2015    10/31/2015 Genetic Testing   [facility] SYNDROME MSH6 Mutation    04/27/2016 Imaging   CT CAP- No acute process or evidence of metastatic disease in the chest. Right middle lobe pulmonary nodule is unchanged back to 11/20/2014, favoring a benign etiology    11/10/2016 Imaging   CT CAP- 1. No evidence of metastatic disease in the chest, abdomen or pelvis. 2. No evidence of local tumor recurrence at the ileocolic anastomosis in the right abdomen or in the pelvis. Decreased mild fat stranding at the base of the right mesentery, most consistent with postsurgical scarring. 3. Aortic atherosclerosis.    Adenocarcinoma of colon ([place])  07/04/2015 Pathology Results   Diagnosis 1. Colon, polyp(s), vicinity of hepatic flexure - TUBULAR ADENOMA WITH FOCAL HIGH GRADE DYSPLASIA. - NO INVASIVE CARCINOMA. 2. Colon, polyp(s), sigmoid - HYPERPLASTIC POLYP. - NO DYSPLASIA OR MALIGNANCY.    07/04/2015 Procedure   Colonoscopoy by Dr. Gala Romney- large polypoid colonic lesion.    08/30/2015 Definitive Surgery   Dr. Arnoldo Morale- right segmental colon resection    08/30/2015 Pathology Results   [id number] intramucosal adenocarcinoma of colon, 0.2 cm inding the laminal propria with negative resection margins and 0/17 lymph nodes.       HISTORY OF PRESENTING ILLNESS:  Julia Osborne 60 y.o.  female history of endometrial cancer stage IV/Lynch syndrome-current on surveillance is here for follow-up/review the results of the CT scan.  Patient denies any worsening shortness of breath or cough.  Appetite is good.  No weight loss.  Denies abdominal pain.  Unfortunately continues to smoke.  No blood in  stools or black or stools.  No blood in urine.  Review of  Systems  Constitutional:  Positive for malaise/fatigue. Negative for chills, diaphoresis, fever and weight loss.  HENT:  Negative for sore throat.   Eyes:  Negative for double vision.  Respiratory:  Negative for cough, hemoptysis, sputum production, shortness of breath and wheezing.   Cardiovascular:  Negative for chest pain, palpitations, orthopnea and leg swelling.  Gastrointestinal:  Negative for blood in stool, constipation, diarrhea, heartburn, melena, nausea and vomiting.  Genitourinary:  Negative for dysuria, frequency and urgency.  Musculoskeletal:  Positive for back pain and joint pain.  Skin: Negative.  Negative for itching and rash.  Neurological:  Positive for tingling. Negative for dizziness, focal weakness, weakness and headaches.  Endo/Heme/Allergies:  Does not bruise/bleed easily.  Psychiatric/Behavioral:  Negative for depression. The patient is not nervous/anxious and does not have insomnia.     MEDICAL HISTORY:  Past Medical History:  Diagnosis Date   Adenocarcinoma of colon ([place]) 09/13/2015   Partial colon resection and chemo tx's.    Anemia    Anxiety    Asthma    Cancer ([place])    endometrial; cancer cells in intestine   COPD (chronic obstructive pulmonary disease) ([place])    no definite diagnosis   Depression    Diverticulitis    Dyspareunia 05/16/2015   Family history of cancer    Family history of kidney cancer    GERD (gastroesophageal reflux disease)    Glaucoma    Indigestion    Lynch syndrome    Neuropathy    feet and hands   Uterine fibroid    Vaginal dryness 05/16/2015   Vaginal itching 07/16/2015   Vaginal Pap smear, abnormal     SURGICAL HISTORY: Past Surgical History:  Procedure Laterality Date   ABDOMINAL HYSTERECTOMY     APPENDECTOMY     BIOPSY N/A 03/14/2015   Procedure: BIOPSY;  Surgeon: Daneil Dolin, MD;  Location: [facility];  Service: Endoscopy;  Laterality: N/A;  Gastric   COLONOSCOPY N/A 01/28/2017   Procedure: COLONOSCOPY;  Surgeon:  Daneil Dolin, MD;  Location: [facility];  Service: Endoscopy;  Laterality: N/A;  11:30am   COLONOSCOPY WITH PROPOFOL N/A 03/14/2015   RMR: Internal hemorrhoids. colonic diverticulosis. Incomplete examination. Prepartation inadequate.   COLONOSCOPY WITH PROPOFOL N/A 07/04/2015   RMR: Colonic diverticulosis . Large polypoid lesion in the vicinity of the hepatic flexure status post saline-assisted piecmeal snare polypectomy  with ablation and tattooing as described. Sigmoid polyp removed as described above. sigmoid colon polyp hyperplastic, hepatic flexure polyp with TA with focal high grade dysplasia    COLONOSCOPY WITH PROPOFOL N/A 11/03/2018   Procedure: COLONOSCOPY WITH PROPOFOL;  Surgeon: Jonathon Bellows, MD;  Location[facility] ENDOSCOPY;  Service: Gastroenterology;  Laterality: N/A;   COLONOSCOPY WITH PROPOFOL N/A 12/09/2020   Procedure: COLONOSCOPY WITH PROPOFOL;  Surgeon: Jonathon Bellows, MD;  Location: [facility] ENDOSCOPY;  Service: Gastroenterology;  Laterality: N/A;  COVID POSITIVE 10/30/2020   COLONOSCOPY WITH PROPOFOL N/A 01/20/2021   Procedure: COLONOSCOPY WITH PROPOFOL;  Surgeon: Jonathon Bellows, MD;  Location: [facility] ENDOSCOPY;  Service: Gastroenterology;  Laterality: N/A;   ENTEROSCOPY N/A 02/12/2020   Procedure: ENTEROSCOPY;  Surgeon: Jonathon Bellows, MD;  Location: [facility] ENDOSCOPY;  Service: Gastroenterology;  Laterality: N/A;   ESOPHAGEAL DILATION N/A 03/14/2015   Procedure: ESOPHAGEAL DILATION;  Surgeon: Daneil Dolin, MD;  Location: [facility];  Service: Endoscopy;  Laterality: N/AVenia Minks 52   ESOPHAGOGASTRODUODENOSCOPY N/A 05/19/2016   Procedure:  ESOPHAGOGASTRODUODENOSCOPY (EGD);  Surgeon: Daneil Dolin, MD;  [facility];  Service: Endoscopy;  Laterality: N/A;  215   ESOPHAGOGASTRODUODENOSCOPY N/A 01/28/2017   Procedure: ESOPHAGOGASTRODUODENOSCOPY (EGD);  Surgeon: Daneil Dolin, MD;  [facility];  Service: Endoscopy;  Laterality: N/A;   ESOPHAGOGASTRODUODENOSCOPY (EGD) WITH  PROPOFOL N/A 03/14/2015   RMR: Mild erosive reflux esophagitis status post passage o f a Maloney dilator. Abnormal gastric mucosa of uncertain significance as described above. status post biopsy, benign   ESOPHAGOGASTRODUODENOSCOPY (EGD) WITH PROPOFOL N/A 11/18/2017   Procedure: ESOPHAGOGASTRODUODENOSCOPY (EGD) WITH PROPOFOL;  Surgeon: Daneil Dolin, MD;  [facility];  Service: Endoscopy;  Laterality: N/A;  12:15pm   ESOPHAGOGASTRODUODENOSCOPY (EGD) WITH PROPOFOL N/A 11/03/2018   Procedure: ESOPHAGOGASTRODUODENOSCOPY (EGD) WITH PROPOFOL;  Surgeon: Jonathon Bellows, MD;  Location: [facility] ENDOSCOPY;  Service: Gastroenterology;  Laterality: N/A;   GIVENS CAPSULE STUDY N/A 12/25/2019   Procedure: GIVENS CAPSULE STUDY;  Surgeon: Jonathon Bellows, MD;  Location: [facility] ENDOSCOPY;  Service: Gastroenterology;  Laterality: N/A;   MALONEY DILATION N/A 05/19/2016   Procedure: [name] DILATION;  Surgeon: Daneil Dolin, MD;  [facility];  Service: Endoscopy;  Laterality: N/A;   MALONEY DILATION N/A 01/28/2017   Procedure: [name] DILATION;  Surgeon: Daneil Dolin, MD;  [facility];  Service: Endoscopy;  Laterality: N/A;   MALONEY DILATION N/A 11/18/2017   Procedure: [name] DILATION;  Surgeon: Daneil Dolin, MD;  [facility];  Service: Endoscopy;  Laterality: N/A;   PARTIAL COLECTOMY  08/30/2015   polyp with adenocarcinoma   POLYPECTOMY N/A 07/04/2015   Procedure: POLYPECTOMY;  Surgeon: Daneil Dolin, MD;  Location: [facility];  Service: Endoscopy;  Laterality: N/A;   PORTACATH PLACEMENT Right 09/2014   TUBAL LIGATION      SOCIAL HISTORY: Social History   Socioeconomic History   Marital status: Married    Spouse name: Not on file   Number of children: 2   Years of education: Not on file   Highest education level: Not on file  Occupational History   Not on file  Tobacco Use   Smoking status: Every Day    Packs/day: 2.00    Years: 25.00    Pack years: 50.00     Types: Cigarettes   Smokeless tobacco: Never   Tobacco comments:    Is trying to [place] off.  Vaping Use   Vaping Use: Never used  Substance and Sexual Activity   Alcohol use: No   Drug use: No   Sexual activity: Not Currently    Birth control/protection: Surgical    Comment: hyst  Other Topics Concern   Not on file  Social History Narrative   #  lives in [place];self; smoking- 1/2 ppd; no alcohol; used to run machines;       FHx-Dad- MI; ? Cancer on autopsy; sisters/aunts- brain; lung cancer x2; oldest sister- kidney cancer      Pt has 65 year old son;daughter 37 years. Brothers- states to have spoken to her family re: importance of them being checked for lynch.    Social Determinants of Health   Financial Resource Strain: Not on file  Food Insecurity: Not on file  Transportation Needs: Not on file  Physical Activity: Not on file  Stress: Not on file  Social Connections: Not on file  Intimate Partner Violence: Not on file    FAMILY HISTORY:  Family History  Problem Relation Age of Onset   Other Mother  clot that went to heart, deceased age 66ss   Heart attack Father        age 58s, deceased   Stroke Father    Cancer Father        "at death determined he was ate up with cancer"   Hypertension Sister    Kidney cancer Sister 61   Diabetes Brother    Hypertension Brother    Endometriosis Daughter    Heart attack Maternal Grandfather    Diabetes Sister    Brain cancer Paternal Aunt    Cancer Paternal Uncle        NOS   Cancer Paternal Uncle        NOS   Cancer Paternal Uncle        NOS   Colon cancer Neg Hx     ALLERGIES:  is allergic to codeine.  MEDICATIONS:  Current Outpatient Medications  Medication Sig Dispense Refill   albuterol (PROVENTIL HFA;VENTOLIN HFA) 108 (90 Base) MCG/ACT inhaler Inhale 2 puffs into the lungs every 2 (two) hours as needed for wheezing or shortness of breath (cough). 1 Inhaler 3   albuterol (PROVENTIL) (2.5 MG/3ML)  0.083% nebulizer solution Take 3 mLs (2.5 mg total) by nebulization every 6 (six) hours as needed for wheezing or shortness of breath. 75 mL 12   ALPRAZolam (XANAX) 0.25 MG tablet Take 0.125 mg by mouth daily as needed for anxiety.      atorvastatin (LIPITOR) 10 MG tablet Take 1 tablet by mouth at bedtime.     citalopram (CELEXA) 40 MG tablet Take 40 mg by mouth at bedtime.  2   dicyclomine (BENTYL) 10 MG capsule Take 1 capsule (10 mg total) by mouth 4 (four) times daily -  before meals and at bedtime. 120 capsule 2   gabapentin (NEURONTIN) 300 MG capsule Take 300 mg by mouth 2 (two) times daily.     loratadine (CLARITIN) 10 MG tablet Take 10 mg by mouth daily as needed for allergies.      LUMIGAN 0.01 % SOLN Place 1 drop into the right eye at bedtime.     Naphazoline-Pheniramine (ALLERGY EYE OP) Place 1-2 drops into both eyes 2 (two) times daily as needed (ITCHY, WATERY EYES).     omeprazole (PRILOSEC) 40 MG capsule Take 1 capsule (40 mg total) by mouth daily. 90 capsule 0   ondansetron (ZOFRAN) 8 MG tablet Take 8 mg by mouth daily as needed (CAR SICKNESS).     PAZEO 0.7 % SOLN Place 1 drop into both eyes every morning.     SIMBRINZA 1-0.2 % SUSP Apply 1 drop to eye 2 (two) times daily.     sodium chloride (OCEAN) 0.65 % SOLN nasal spray Place 1 spray into both nostrils as needed for congestion.     sucralfate (CARAFATE) 1 GM/10ML suspension Take 10 mLs (1 g total) by mouth 4 (four) times daily. 420 mL 1   traMADol (ULTRAM) 50 MG tablet TAKE 1 TABLET BY MOUTH DAILY AS NEEDED. 30 tablet 0   traZODone (DESYREL) 50 MG tablet Take 50 mg by mouth at bedtime.     No current facility-administered medications for this visit.      Marland Kitchen  PHYSICAL EXAMINATION: ECOG PERFORMANCE STATUS: 0 - Asymptomatic  Vitals:   03/13/21 1045  BP: 129/69  Pulse: 62  Resp: 18  Temp: 98.4 F (36.9 C)   Filed Weights   03/13/21 1039  Weight: 174 lb (78.9 kg)    Physical Exam Constitutional:  Comments:  Patient is alone.  HENT:     Head: Normocephalic and atraumatic.     Mouth/Throat:     Pharynx: No oropharyngeal exudate.  Eyes:     Pupils: Pupils are equal, round, and reactive to light.  Cardiovascular:     Rate and Rhythm: Normal rate and regular rhythm.  Pulmonary:     Effort: No respiratory distress.     Breath sounds: No wheezing.  Abdominal:     General: Bowel sounds are normal. There is no distension.     Palpations: Abdomen is soft. There is no mass.     Tenderness: There is no abdominal tenderness. There is no guarding or rebound.  Musculoskeletal:        General: No tenderness. Normal range of motion.     Cervical back: Normal range of motion and neck supple.  Skin:    General: Skin is warm.     Comments:    Neurological:     Mental Status: She is alert and oriented to person, place, and time.  Psychiatric:        Mood and Affect: Affect normal.     LABORATORY DATA:  I have reviewed the data as listed Lab Results  Component Value Date   WBC 10.6 (H) 03/13/2021   HGB 13.1 03/13/2021   HCT 38.8 03/13/2021   MCV 91.1 03/13/2021   PLT 248 03/13/2021   Recent Labs    04/30/20 1401 10/31/20 1946 03/13/21 0949  NA 139 138 137  K 3.8 3.4* 4.0  CL 103 106 102  CO2 26 21* 28  GLUCOSE 110* 93 91  BUN '11 11 8  ' CREATININE 0.88 0.78 0.54  CALCIUM 8.5* 8.9 8.7*  GFRNONAA >60 >60 >60  GFRAA >60  --   --   PROT 7.0  --  7.4  ALBUMIN 3.7  --  3.8  AST 23  --  17  ALT 24  --  19  ALKPHOS 58  --  53  BILITOT 0.2*  --  0.5    RADIOGRAPHIC STUDIES: I have personally reviewed the radiological images as listed and agreed with the findings in the report. No results found.  ASSESSMENT & PLAN:   Endometrial cancer ([place]) # Uterine Cancer endometrioid type grade 2; stage IV [lung nodules at presentation 2015/]-February 2022- CT scan abdomen pelvis/chest-negative for any obvious recurrence.   STABLE. No clinical evidence of recurrence.   # Lynch syndrome Ozzie Hoyle  of high-grade polyp/status post colectomy: Last colonoscopy-May 2022;  repeat on annual basis;  EGD- May 2021--reviewed.STABLE Urine cytology pending.   #Active smoker on Wellbutrin-discussed smoking cessation; DECLINES.   #Peripheral neuropathy grade 2-3; STABLE;  continue tramadol as needed.  # Constipation- on amitizia-STABLE  # # Patient is to unvaccinated to Covid-19 [s/p covid april2022]- DECLINED  # port flush- q 3 M   DISPOSITION: # port flush every 3 months # follow up in 6 months/labs- cbc/cmp/ca-125/urine cytology; Dr.B  # I reviewed the blood work- with the patient in detail; also reviewed the imaging independently [as summarized above]; and with the patient in detail.        All questions were answered. The patient knows to call the clinic with any problems, questions or concerns.     Cammie Sickle, MD 03/13/2021 9:12 PM

## 2021-03-14 LAB — CA 125: Cancer Antigen (CA) 125: 13.4 U/mL (ref 0.0–38.1)

## 2021-03-17 LAB — CYTOLOGY - NON PAP

## 2021-04-03 DIAGNOSIS — E538 Deficiency of other specified B group vitamins: Secondary | ICD-10-CM | POA: Diagnosis not present

## 2021-04-07 DIAGNOSIS — E538 Deficiency of other specified B group vitamins: Secondary | ICD-10-CM | POA: Diagnosis not present

## 2021-04-09 ENCOUNTER — Other Ambulatory Visit: Payer: Self-pay

## 2021-04-09 ENCOUNTER — Emergency Department
Admission: EM | Admit: 2021-04-09 | Discharge: 2021-04-09 | Disposition: A | Discharge status: Home / Self Care | Payer: Medicare Other | Attending: Emergency Medicine | Admitting: Emergency Medicine

## 2021-04-09 ENCOUNTER — Encounter: Payer: Self-pay | Admitting: Emergency Medicine

## 2021-04-09 ENCOUNTER — Emergency Department: Payer: Medicare Other

## 2021-04-09 ENCOUNTER — Encounter: Payer: Self-pay | Admitting: Gastroenterology

## 2021-04-09 ENCOUNTER — Ambulatory Visit (INDEPENDENT_AMBULATORY_CARE_PROVIDER_SITE_OTHER): Payer: Medicare Other | Admitting: Gastroenterology

## 2021-04-09 VITALS — Ht 63.0 in | Wt 169.8 lb

## 2021-04-09 DIAGNOSIS — R140 Abdominal distension (gaseous): Secondary | ICD-10-CM

## 2021-04-09 DIAGNOSIS — R531 Weakness: Secondary | ICD-10-CM | POA: Diagnosis not present

## 2021-04-09 DIAGNOSIS — Z8542 Personal history of malignant neoplasm of other parts of uterus: Secondary | ICD-10-CM | POA: Diagnosis not present

## 2021-04-09 DIAGNOSIS — R112 Nausea with vomiting, unspecified: Secondary | ICD-10-CM | POA: Diagnosis not present

## 2021-04-09 DIAGNOSIS — R109 Unspecified abdominal pain: Secondary | ICD-10-CM | POA: Insufficient documentation

## 2021-04-09 DIAGNOSIS — Z85038 Personal history of other malignant neoplasm of large intestine: Secondary | ICD-10-CM | POA: Insufficient documentation

## 2021-04-09 DIAGNOSIS — R1110 Vomiting, unspecified: Secondary | ICD-10-CM | POA: Diagnosis not present

## 2021-04-09 DIAGNOSIS — Z9221 Personal history of antineoplastic chemotherapy: Secondary | ICD-10-CM | POA: Diagnosis not present

## 2021-04-09 DIAGNOSIS — J45909 Unspecified asthma, uncomplicated: Secondary | ICD-10-CM | POA: Diagnosis not present

## 2021-04-09 DIAGNOSIS — K2900 Acute gastritis without bleeding: Secondary | ICD-10-CM

## 2021-04-09 DIAGNOSIS — J449 Chronic obstructive pulmonary disease, unspecified: Secondary | ICD-10-CM | POA: Diagnosis not present

## 2021-04-09 DIAGNOSIS — E860 Dehydration: Secondary | ICD-10-CM | POA: Diagnosis not present

## 2021-04-09 DIAGNOSIS — R9431 Abnormal electrocardiogram [ECG] [EKG]: Secondary | ICD-10-CM | POA: Diagnosis not present

## 2021-04-09 DIAGNOSIS — F17210 Nicotine dependence, cigarettes, uncomplicated: Secondary | ICD-10-CM | POA: Insufficient documentation

## 2021-04-09 LAB — COMPREHENSIVE METABOLIC PANEL
ALT: 18 U/L (ref 0–44)
AST: 18 U/L (ref 15–41)
Albumin: 4 g/dL (ref 3.5–5.0)
Alkaline Phosphatase: 57 U/L (ref 38–126)
Anion gap: 12 (ref 5–15)
BUN: 10 mg/dL (ref 6–20)
CO2: 28 mmol/L (ref 22–32)
Calcium: 9.2 mg/dL (ref 8.9–10.3)
Chloride: 97 mmol/L — ABNORMAL LOW (ref 98–111)
Creatinine, Ser: 0.79 mg/dL (ref 0.44–1.00)
GFR, Estimated: >60 mL/min (ref >60)
Glucose, Bld: 135 mg/dL — ABNORMAL HIGH (ref 70–99)
Potassium: 3.4 mmol/L — ABNORMAL LOW (ref 3.5–5.1)
Sodium: 137 mmol/L (ref 135–145)
Total Bilirubin: 0.7 mg/dL (ref 0.3–1.2)
Total Protein: 7.5 g/dL (ref 6.5–8.1)

## 2021-04-09 LAB — CBC
HCT: 40.8 % (ref 36.0–46.0)
Hemoglobin: 14.2 g/dL (ref 12.0–15.0)
MCH: 31.2 pg (ref 26.0–34.0)
MCHC: 34.8 g/dL (ref 30.0–36.0)
MCV: 89.7 fL (ref 80.0–100.0)
Platelets: 261 10*3/uL (ref 150–400)
RBC: 4.55 MIL/uL (ref 3.87–5.11)
RDW: 12.9 % (ref 11.5–15.5)
WBC: 13.6 10*3/uL — ABNORMAL HIGH (ref 4.0–10.5)
nRBC: 0 % (ref 0.0–0.2)

## 2021-04-09 LAB — LIPASE, BLOOD: Lipase: 30 U/L (ref 11–51)

## 2021-04-09 MED ORDER — MORPHINE SULFATE (PF) 4 MG/ML IV SOLN
4.0000 mg | Freq: Once | INTRAVENOUS | Status: AC
Start: 2021-04-09 — End: 2021-04-09
  Administered 2021-04-09: 4 mg via INTRAVENOUS
  Filled 2021-04-09: qty 1

## 2021-04-09 MED ORDER — ONDANSETRON 4 MG PO TBDP: 4.0000 mg | ORAL_TABLET | Freq: Three times a day (TID) | ORAL | 0 refills | Status: DC | PRN

## 2021-04-09 MED ORDER — PANTOPRAZOLE SODIUM 40 MG IV SOLR
40.0000 mg | Freq: Once | INTRAVENOUS | Status: AC
  Administered 2021-04-09: 40 mg via INTRAVENOUS
  Filled 2021-04-09: qty 40

## 2021-04-09 MED ORDER — ONDANSETRON HCL 4 MG/2ML IJ SOLN
4.0000 mg | Freq: Once | INTRAMUSCULAR | Status: AC
  Administered 2021-04-09: 4 mg via INTRAVENOUS
  Filled 2021-04-09: qty 2

## 2021-04-09 MED ORDER — PANTOPRAZOLE SODIUM 40 MG PO TBEC: 40.0000 mg | DELAYED_RELEASE_TABLET | Freq: Every day | ORAL | 1 refills | Status: DC

## 2021-04-09 MED ORDER — IOHEXOL 300 MG/ML  SOLN
100.0000 mL | Freq: Once | INTRAMUSCULAR | Status: AC | PRN
  Administered 2021-04-09: 100 mL via INTRAVENOUS
  Filled 2021-04-09: qty 100

## 2021-04-09 MED ORDER — SODIUM CHLORIDE 0.9 % IV SOLN
1000.0000 mL | Freq: Once | INTRAVENOUS | Status: AC
  Administered 2021-04-09: 1000 mL via INTRAVENOUS

## 2021-04-09 NOTE — ED Triage Notes (Signed)
C/O abdominal pain and vomiting x 4 days.  Sent to ED for possible fluids.  Patietn is AAOx3.  Skin warm and dry. NAD

## 2021-04-09 NOTE — Progress Notes (Signed)
Jonathon Bellows MD, MRCP(U.K) [address]  [place]  [place], [place] [address]  Main: [phone]  Fax: [phone]   Primary Care Physician: Philmore Pali, NP  Primary Gastroenterologist:  Dr. Jonathon Bellows   Chief Complaint  Patient presents with   Nausea and vomiting    HPI: Julia Osborne is a 60 y.o. female   Summary of history :   Initially  seen in February 2020 for dysphagia and Lynch syndrome.  She has a history of endometrial cancer, colon cancer, GERD.  She follows with Dr. Rogue Bussing  in oncology. She has been previously established with [facility] gastroenterology.  Carries a history of dysphagia, GERD and CA of her colon, status post right hemicolectomy.  History of stage IV uterine cancer.  Colon polyp resected in October 2016 showed an adenoma with focal high-grade dysplasia.   10/24/18: H pylori - negative 11/03/2018: EGD: Normal duodenal . Gastric bx shows mild chronic gastritis .  Colonoscopy : Hyperplastic rectal polyp. 12/25/2019: Capsule study of the small bowel showed a few tiny polyps in the proximal small bowel were noted.  Probably 2 to 3 mm in size.  A few lesions suggestive of small bowel AVMs were seen in the distal small bowel.  Nonbleeding.  02/12/2020: Push enteroscopy polyps in the first portion of the duodenum were noted 2 to 4 mm in size.  Resected and retrieved.  Biopsy report demonstrated peptic duodenitis  10/28/2020: CT scan of the abdomen and pelvis with contrast no evidence of recurring malignancy    Interval history 11/28/2020-04/09/2021  12/09/2020: Colonoscopy: Large amount of stool seen in the rectum sigmoid colon interfering with visualization, repeat colonoscopy on 01/20/2021 8 mm polyp resected in the sigmoid colon otherwise examination was normal.  The polyp was a sessile serrated adenoma  11/28/2020: Urine analysis no RBCs seen  She comes into the office today with a 4-day history of nausea vomiting.  Unable to keep any food down or liquids  down.  Having epigastric pain.  Having regular bowel movements passing gas.  When I went into the room to see her she was actively throwing up nonbloody emesis.  In significant discomfort.   Current Outpatient Medications  Medication Sig Dispense Refill   albuterol (PROVENTIL HFA;VENTOLIN HFA) 108 (90 Base) MCG/ACT inhaler Inhale 2 puffs into the lungs every 2 (two) hours as needed for wheezing or shortness of breath (cough). 1 Inhaler 3   albuterol (PROVENTIL) (2.5 MG/3ML) 0.083% nebulizer solution Take 3 mLs (2.5 mg total) by nebulization every 6 (six) hours as needed for wheezing or shortness of breath. 75 mL 12   ALPRAZolam (XANAX) 0.25 MG tablet Take 0.125 mg by mouth daily as needed for anxiety.      atorvastatin (LIPITOR) 10 MG tablet Take 1 tablet by mouth at bedtime.     citalopram (CELEXA) 40 MG tablet Take 40 mg by mouth at bedtime.  2   dicyclomine (BENTYL) 10 MG capsule Take 1 capsule (10 mg total) by mouth 4 (four) times daily -  before meals and at bedtime. 120 capsule 2   gabapentin (NEURONTIN) 300 MG capsule Take 300 mg by mouth 2 (two) times daily.     loratadine (CLARITIN) 10 MG tablet Take 10 mg by mouth daily as needed for allergies.      LUMIGAN 0.01 % SOLN Place 1 drop into the right eye at bedtime.     Naphazoline-Pheniramine (ALLERGY EYE OP) Place 1-2 drops into both eyes 2 (two) times  daily as needed (ITCHY, WATERY EYES).     ondansetron (ZOFRAN) 8 MG tablet Take 8 mg by mouth daily as needed (CAR SICKNESS).     PAZEO 0.7 % SOLN Place 1 drop into both eyes every morning.     SIMBRINZA 1-0.2 % SUSP Apply 1 drop to eye 2 (two) times daily.     sodium chloride (OCEAN) 0.65 % SOLN nasal spray Place 1 spray into both nostrils as needed for congestion.     sucralfate (CARAFATE) 1 GM/10ML suspension Take 10 mLs (1 g total) by mouth 4 (four) times daily. 420 mL 1   traMADol (ULTRAM) 50 MG tablet TAKE 1 TABLET BY MOUTH DAILY AS NEEDED. 30 tablet 0   traZODone (DESYREL) 50 MG  tablet Take 50 mg by mouth at bedtime.     omeprazole (PRILOSEC) 40 MG capsule Take 1 capsule (40 mg total) by mouth daily. (Patient not taking: Reported on 04/09/2021) 90 capsule 0   No current facility-administered medications for this visit.    Allergies as of 04/09/2021 - Review Complete 04/09/2021  Allergen Reaction Noted   Codeine Rash 08/25/2011    ROS:  General: Negative for anorexia, weight loss, fever, chills, fatigue, weakness. ENT: Negative for hoarseness, difficulty swallowing , nasal congestion. CV: Negative for chest pain, angina, palpitations, dyspnea on exertion, peripheral edema.  Respiratory: Negative for dyspnea at rest, dyspnea on exertion, cough, sputum, wheezing.  GI: See history of present illness. GU:  Negative for dysuria, hematuria, urinary incontinence, urinary frequency, nocturnal urination.  Endo: Negative for unusual weight change.    Physical Examination:   Ht 5\' 3"  (1.6 m)   Wt 169 lb 12.8 oz (77 kg)   BMI 30.08 kg/m   General: Appears in distress actively throwing up into a bag nonbloody emesis Eyes: No icterus. Conjunctivae pink. Appears dry and dehydrated Abdomen: Moderate abdominal distention epigastric tenderness bowel sounds present Extremities: No lower extremity edema. No clubbing or deformities. Neuro: Alert and oriented x 3.  Grossly intact. Skin: Warm and dry, no jaundice.   Psych: Alert and cooperative, normal mood and affect.   Imaging Studies: No results found.  Assessment and Plan:   Julia Osborne is a 60 y.o. y/o female  here to follow up for abdominal pain.  She has a history of Lynch syndrome.   History of colon cancer with right hemicolectomy, uterine cancer.  Recent abdominal discomfort which is suggestive of IBS constipation likely was secondary to use of tramadol and likely abdominal adhesions from prior surgery.  In terms of her Lynch syndrome she requires annual skin exam which we will address at her next visit.   Continue Trulance for constipation as an outpatient colonoscopy and EGD due in May 2023 and annual urine analysis next due in March 2023.  Today she is here to see me for acute nausea vomiting going on for over 4 days unable to keep any food down appears clinically to be high dehydrated there is epigastric tenderness with abdominal distention my concern is that she has a bowel obstruction due to prior abdominal surgeries and underlying adhesions.  I have strongly suggested she has to go to the ER now to get IV fluids and antiemetics with abdominal imaging to evaluate further.  I will call and inform the ER to give them a heads up about her     Dr Jonathon Bellows  MD,MRCP [facility]) Follow up in 3 months

## 2021-04-09 NOTE — ED Provider Notes (Signed)
[facility] Emergency Department Provider Note   ____________________________________________    I have reviewed the triage vital signs and the nursing notes.   HISTORY  Chief Complaint Abdominal Pain     HPI Julia Osborne is a 60 y.o. female with history as below presents with complaints of nausea vomiting and abdominal cramping over the last 3 to 4 days.  She reports she is vomiting so much is keeping her up at night.  She reports abdominal pain started after vomiting.  No diarrhea reported.  No fevers or chills.  Feels weak and dehydrated.  Has not take anything for this.  Does have a surgical history as detailed below.  Past Medical History:  Diagnosis Date   Adenocarcinoma of colon ([place]) 09/13/2015   Partial colon resection and chemo tx's.    Anemia    Anxiety    Asthma    Cancer ([place])    endometrial; cancer cells in intestine   COPD (chronic obstructive pulmonary disease) (HCC)    no definite diagnosis   Depression    Diverticulitis    Dyspareunia 05/16/2015   Family history of cancer    Family history of kidney cancer    GERD (gastroesophageal reflux disease)    Glaucoma    Indigestion    Lynch syndrome    Neuropathy    feet and hands   Uterine fibroid    Vaginal dryness 05/16/2015   Vaginal itching 07/16/2015   Vaginal Pap smear, abnormal     Patient Active Problem List   Diagnosis Date Noted   History of endometrial cancer 06/21/2019   Diverticulitis 06/21/2019   COPD (chronic obstructive pulmonary disease) ([place]) 06/21/2019   Asthma 06/21/2019   Uterine fibroid 06/21/2019   Anxiety disorder, unspecified 06/21/2019   Anemia 06/21/2019   Abdominal pain 12/21/2016   Lynch syndrome 10/31/2015   Genetic testing 10/21/2015   Abdominal pain, left upper quadrant 10/17/2015   Family history of cancer    Family history of kidney cancer    Adenocarcinoma of colon ([place]) 09/13/2015   Colon neoplasm 08/30/2015   Vaginal itching  07/16/2015   History of colonic polyps    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 drug ([place]) 06/28/2015   Constipation 06/05/2015   Abnormal CT scan, sigmoid colon 06/05/2015   Vaginal dryness 05/16/2015   Dyspareunia 05/16/2015   Mucosal abnormality of stomach    Reflux esophagitis    Dysphagia    Hematochezia    Diverticulosis of colon without hemorrhage    Rectal bleeding 02/14/2015   GERD (gastroesophageal reflux disease) 02/14/2015   Esophageal dysphagia 02/14/2015   Abdominal pain, epigastric 02/14/2015   Nausea with vomiting 02/14/2015   Endometrial cancer ([place]) 10/09/2014   Conversion reaction    Depression    Aphasia 09/11/2014    Past Surgical History:  Procedure Laterality Date   ABDOMINAL HYSTERECTOMY     APPENDECTOMY     BIOPSY N/A 03/14/2015   Procedure: BIOPSY;  Surgeon: Daneil Dolin, MD;  Location: [facility];  Service: Endoscopy;  Laterality: N/A;  Gastric   COLONOSCOPY N/A 01/28/2017   Procedure: COLONOSCOPY;  Surgeon: Daneil Dolin, MD;  Location: [facility];  Service: Endoscopy;  Laterality: N/A;  11:30am   COLONOSCOPY WITH PROPOFOL N/A 03/14/2015   RMR: Internal hemorrhoids. colonic diverticulosis. Incomplete examination. Prepartation inadequate.   COLONOSCOPY WITH PROPOFOL N/A 07/04/2015   RMR: Colonic diverticulosis . Large polypoid lesion in the vicinity of the hepatic flexure status post saline-assisted piecmeal  snare polypectomy  with ablation and tattooing as described. Sigmoid polyp removed as described above. sigmoid colon polyp hyperplastic, hepatic flexure polyp with TA with focal high grade dysplasia    COLONOSCOPY WITH PROPOFOL N/A 11/03/2018   Procedure: COLONOSCOPY WITH PROPOFOL;  Surgeon: Jonathon Bellows, MD;  Location: [facility] ENDOSCOPY;  Service: Gastroenterology;  Laterality: N/A;   COLONOSCOPY WITH PROPOFOL N/A 12/09/2020   Procedure: COLONOSCOPY WITH PROPOFOL;  Surgeon: Jonathon Bellows, MD;  Location[facility]COPY;  Service: Gastroenterology;   Laterality: N/A;  COVID POSITIVE 10/30/2020   COLONOSCOPY WITH PROPOFOL N/A 01/20/2021   Procedure: COLONOSCOPY WITH PROPOFOL;  Surgeon: Jonathon Bellows, MD;  Location: [facility] ENDOSCOPY;  Service: Gastroenterology;  Laterality: N/A;   ENTEROSCOPY N/A 02/12/2020   Procedure: ENTEROSCOPY;  Surgeon: Jonathon Bellows, MD;  Location: [facility] ENDOSCOPY;  Service: Gastroenterology;  Laterality: N/A;   ESOPHAGEAL DILATION N/A 03/14/2015   Procedure: ESOPHAGEAL DILATION;  Surgeon: Daneil Dolin, MD;  Location: [facility];  Service: Endoscopy;  Laterality: N/AVenia Minks 5   ESOPHAGOGASTRODUODENOSCOPY N/A 05/19/2016   Procedure: ESOPHAGOGASTRODUODENOSCOPY (EGD);  Surgeon: Daneil Dolin, MD;  [facility];  Service: Endoscopy;  Laterality: N/A;  215   ESOPHAGOGASTRODUODENOSCOPY N/A 01/28/2017   Procedure: ESOPHAGOGASTRODUODENOSCOPY (EGD);  Surgeon: Daneil Dolin, MD;  [facility];  Service: Endoscopy;  Laterality: N/A;   ESOPHAGOGASTRODUODENOSCOPY (EGD) WITH PROPOFOL N/A 03/14/2015   RMR: Mild erosive reflux esophagitis status post passage o f a Maloney dilator. Abnormal gastric mucosa of uncertain significance as described above. status post biopsy, benign   ESOPHAGOGASTRODUODENOSCOPY (EGD) WITH PROPOFOL N/A 11/18/2017   Procedure: ESOPHAGOGASTRODUODENOSCOPY (EGD) WITH PROPOFOL;  Surgeon: Daneil Dolin, MD;  [facility];  Service: Endoscopy;  Laterality: N/A;  12:15pm   ESOPHAGOGASTRODUODENOSCOPY (EGD) WITH PROPOFOL N/A 11/03/2018   Procedure: ESOPHAGOGASTRODUODENOSCOPY (EGD) WITH PROPOFOL;  Surgeon: Jonathon Bellows, MD;  Location: [facility] ENDOSCOPY;  Service: Gastroenterology;  Laterality: N/A;   GIVENS CAPSULE STUDY N/A 12/25/2019   Procedure: GIVENS CAPSULE STUDY;  Surgeon: Jonathon Bellows, MD;  Location: [facility] ENDOSCOPY;  Service: Gastroenterology;  Laterality: N/A;   MALONEY DILATION N/A 05/19/2016   Procedure: [name] DILATION;  Surgeon: Daneil Dolin, MD;  [facility];  Service:  Endoscopy;  Laterality: N/A;   MALONEY DILATION N/A 01/28/2017   Procedure: [name] DILATION;  Surgeon: Daneil Dolin, MD;  [facility];  Service: Endoscopy;  Laterality: N/A;   MALONEY DILATION N/A 11/18/2017   Procedure: [name] DILATION;  Surgeon: Daneil Dolin, MD;  [facility];  Service: Endoscopy;  Laterality: N/A;   PARTIAL COLECTOMY  08/30/2015   polyp with adenocarcinoma   POLYPECTOMY N/A 07/04/2015   Procedure: POLYPECTOMY;  Surgeon: Daneil Dolin, MD;  Location: [facility];  Service: Endoscopy;  Laterality: N/A;   PORTACATH PLACEMENT Right 09/2014   TUBAL LIGATION      Prior to Admission medications   Medication Sig Start Date End Date Taking? Authorizing Provider  albuterol (PROVENTIL HFA;VENTOLIN HFA) 108 (90 Base) MCG/ACT inhaler Inhale 2 puffs into the lungs every 2 (two) hours as needed for wheezing or shortness of breath (cough). 10/17/15   Annitta Needs, NP  albuterol (PROVENTIL) (2.5 MG/3ML) 0.083% nebulizer solution Take 3 mLs (2.5 mg total) by nebulization every 6 (six) hours as needed for wheezing or shortness of breath. 01/16/16   Penland, Kelby Fam, MD  ALPRAZolam Duanne Moron) 0.25 MG tablet Take 0.125 mg by mouth daily as needed for anxiety.     [provider]  atorvastatin (LIPITOR) 10 MG tablet Take 1 tablet by mouth at bedtime. 10/22/20   [provider]  citalopram (CELEXA) 40 MG tablet Take 40 mg by mouth at bedtime. 01/05/17   [provider]  dicyclomine (BENTYL) 10 MG capsule Take 1 capsule (10 mg total) by mouth 4 (four) times daily -  before meals and at bedtime. 01/29/20   Jonathon Bellows, MD  gabapentin (NEURONTIN) 300 MG capsule Take 300 mg by mouth 2 (two) times daily. 03/10/19   [provider]  loratadine (CLARITIN) 10 MG tablet Take 10 mg by mouth daily as needed for allergies.     [provider]  LUMIGAN 0.01 % SOLN Place 1 drop into the right eye at bedtime. 06/24/18   [provider]   Naphazoline-Pheniramine (ALLERGY EYE OP) Place 1-2 drops into both eyes 2 (two) times daily as needed (ITCHY, WATERY EYES).    [provider]  omeprazole (PRILOSEC) 40 MG capsule Take 1 capsule (40 mg total) by mouth daily. Patient not taking: Reported on 04/09/2021 09/11/20   Jonathon Bellows, MD  ondansetron (ZOFRAN) 8 MG tablet Take 8 mg by mouth daily as needed (CAR SICKNESS).    [provider]  PAZEO 0.7 % SOLN Place 1 drop into both eyes every morning. 06/27/18   [provider]  SIMBRINZA 1-0.2 % SUSP Apply 1 drop to eye 2 (two) times daily. 04/04/19   [provider]  sodium chloride (OCEAN) 0.65 % SOLN nasal spray Place 1 spray into both nostrils as needed for congestion.    [provider]  sucralfate (CARAFATE) 1 GM/10ML suspension Take 10 mLs (1 g total) by mouth 4 (four) times daily. 10/24/18   Jonathon Bellows, MD  traMADol (ULTRAM) 50 MG tablet TAKE 1 TABLET BY MOUTH DAILY AS NEEDED. 06/07/19   Verlon Au, NP  traZODone (DESYREL) 50 MG tablet Take 50 mg by mouth at bedtime.    [provider]     Allergies Codeine  Family History  Problem Relation Age of Onset   Other Mother        clot that went to heart, deceased age 86ss   Heart attack Father        age 71s, deceased   Stroke Father    Cancer Father        "at death determined he was ate up with cancer"   Hypertension Sister    Kidney cancer Sister 17   Diabetes Brother    Hypertension Brother    Endometriosis Daughter    Heart attack Maternal Grandfather    Diabetes Sister    Brain cancer Paternal Aunt    Cancer Paternal Uncle        NOS   Cancer Paternal Uncle        NOS   Cancer Paternal Uncle        NOS   Colon cancer Neg Hx     Social History Social History   Tobacco Use   Smoking status: Every Day    Packs/day: 2.00    Years: 25.00    Pack years: 50.00    Types: Cigarettes   Smokeless tobacco: Never   Tobacco comments:    Is trying to [place]  off.  Vaping Use   Vaping Use: Never used  Substance Use Topics   Alcohol use: No   Drug use: No    Review of Systems  Constitutional: No fever/chills Eyes: No visual changes.  ENT: No  sore throat.  Dry Cardiovascular: Denies chest pain. Respiratory: Denies shortness of breath. Gastrointestinal: As above Genitourinary: Negative for dysuria. Musculoskeletal: Negative for back pain. Skin: Negative for rash. Neurological: Negative for headaches or weakness   ____________________________________________   PHYSICAL EXAM:  VITAL SIGNS: ED Triage Vitals  Enc Vitals Group     BP 04/09/21 1120 (!) 153/82     Pulse Rate 04/09/21 1120 81     Resp 04/09/21 1120 18     Temp 04/09/21 1120 98.6 F (37 C)     Temp Source 04/09/21 1120 Oral     SpO2 04/09/21 1120 97 %     Weight 04/09/21 1104 77 kg (169 lb 12.1 oz)     Height 04/09/21 1104 1.6 m (5\' 3" )     Head Circumference --      Peak Flow --      Pain Score 04/09/21 1104 6     Pain Loc --      Pain Edu? --      Excl. in [place]? --     Constitutional: Alert and oriented.  Nose: No congestion/rhinnorhea. Mouth/Throat: Mucous membranes are dry Neck:  Painless ROM Cardiovascular: Normal rate, regular rhythm. Grossly normal heart sounds.  Good peripheral circulation. Respiratory: Normal respiratory effort.  No retractions. Lungs CTAB. Gastrointestinal: Soft, mild diffuse tenderness no distention.  No CVA tenderness. Musculoskeletal: No lower extremity tenderness nor edema.  Warm and well perfused Neurologic:  Normal speech and language. No gross focal neurologic deficits are appreciated.  Skin:  Skin is warm, dry and intact. No rash noted. Psychiatric: Mood and affect are normal. Speech and behavior are normal.  ____________________________________________   LABS (all labs ordered are listed, but only abnormal results are displayed)  Labs Reviewed  COMPREHENSIVE METABOLIC PANEL - Abnormal; Notable for the following  components:      Result Value   Potassium 3.4 (*)    Chloride 97 (*)    Glucose, Bld 135 (*)    All other components within normal limits  CBC - Abnormal; Notable for the following components:   WBC 13.6 (*)    All other components within normal limits  LIPASE, BLOOD  URINALYSIS, COMPLETE (UACMP) WITH MICROSCOPIC  POC URINE PREG, ED   ____________________________________________  EKG  ED ECG REPORT I, Lavonia Drafts, the attending physician, personally viewed and interpreted this ECG.  Date: 04/09/2021  Rhythm: normal sinus rhythm QRS Axis: normal Intervals: normal ST/T Wave abnormalities: normal Narrative Interpretation: no evidence of acute ischemia  ____________________________________________  RADIOLOGY  CT abdomen pelvis reviewed by me ____________________________________________   PROCEDURES  Procedure(s) performed: No  Procedures   Critical Care performed: No ____________________________________________   INITIAL IMPRESSION / ASSESSMENT AND PLAN / ED COURSE  Pertinent labs & imaging results that were available during my care of the patient were reviewed by me and considered in my medical decision making (see chart for details).   Patient presents with nausea vomiting abdominal cramping as detailed above, differential includes viral gastroenteritis, less likely small bowel obstruction, pancreatitis.  We will give IV morphine, IV Zofran, IV fluids, obtain CT abdomen pelvis and reevaluate    ____________________________________________   FINAL CLINICAL IMPRESSION(S) / ED DIAGNOSES  Final diagnoses:  None        Note:  This document was prepared using Dragon voice recognition software and may include unintentional dictation errors.    Lavonia Drafts, MD 04/09/21 1359

## 2021-04-23 ENCOUNTER — Other Ambulatory Visit: Payer: Self-pay | Admitting: Gastroenterology

## 2021-04-30 ENCOUNTER — Ambulatory Visit: Payer: Medicare Other | Admitting: Dermatology

## 2021-05-16 DIAGNOSIS — J4520 Mild intermittent asthma, uncomplicated: Secondary | ICD-10-CM | POA: Diagnosis not present

## 2021-05-16 DIAGNOSIS — E785 Hyperlipidemia, unspecified: Secondary | ICD-10-CM | POA: Diagnosis not present

## 2021-05-16 DIAGNOSIS — G629 Polyneuropathy, unspecified: Secondary | ICD-10-CM | POA: Diagnosis not present

## 2021-05-16 DIAGNOSIS — Z79899 Other long term (current) drug therapy: Secondary | ICD-10-CM | POA: Diagnosis not present

## 2021-05-16 DIAGNOSIS — F514 Sleep terrors [night terrors]: Secondary | ICD-10-CM | POA: Diagnosis not present

## 2021-05-16 DIAGNOSIS — I700 Atherosclerosis of aorta: Secondary | ICD-10-CM | POA: Diagnosis not present

## 2021-06-02 ENCOUNTER — Encounter: Payer: Self-pay | Admitting: Gastroenterology

## 2021-06-02 ENCOUNTER — Ambulatory Visit: Payer: Medicare Other | Admitting: Gastroenterology

## 2021-06-13 ENCOUNTER — Inpatient Hospital Stay: Payer: Medicare Other | Attending: Internal Medicine

## 2021-07-25 DIAGNOSIS — E785 Hyperlipidemia, unspecified: Secondary | ICD-10-CM | POA: Diagnosis not present

## 2021-07-25 DIAGNOSIS — Z0000 Encounter for general adult medical examination without abnormal findings: Secondary | ICD-10-CM | POA: Diagnosis not present

## 2021-07-25 DIAGNOSIS — Z9181 History of falling: Secondary | ICD-10-CM | POA: Diagnosis not present

## 2021-08-06 DIAGNOSIS — R042 Hemoptysis: Secondary | ICD-10-CM | POA: Diagnosis not present

## 2021-08-06 DIAGNOSIS — R0781 Pleurodynia: Secondary | ICD-10-CM | POA: Diagnosis not present

## 2021-08-06 DIAGNOSIS — M25571 Pain in right ankle and joints of right foot: Secondary | ICD-10-CM | POA: Diagnosis not present

## 2021-08-06 DIAGNOSIS — R42 Dizziness and giddiness: Secondary | ICD-10-CM | POA: Diagnosis not present

## 2021-08-06 DIAGNOSIS — N644 Mastodynia: Secondary | ICD-10-CM | POA: Diagnosis not present

## 2021-08-06 DIAGNOSIS — J441 Chronic obstructive pulmonary disease with (acute) exacerbation: Secondary | ICD-10-CM | POA: Diagnosis not present

## 2021-08-06 DIAGNOSIS — E538 Deficiency of other specified B group vitamins: Secondary | ICD-10-CM | POA: Diagnosis not present

## 2021-09-11 ENCOUNTER — Other Ambulatory Visit: Payer: Self-pay | Admitting: *Deleted

## 2021-09-11 DIAGNOSIS — C541 Malignant neoplasm of endometrium: Secondary | ICD-10-CM

## 2021-09-12 ENCOUNTER — Other Ambulatory Visit: Payer: Medicare Other

## 2021-09-12 ENCOUNTER — Ambulatory Visit: Payer: Medicare Other | Admitting: Internal Medicine

## 2021-09-19 ENCOUNTER — Inpatient Hospital Stay: Payer: Medicare Other | Admitting: Internal Medicine

## 2021-09-19 ENCOUNTER — Inpatient Hospital Stay: Payer: Medicare Other | Attending: Internal Medicine

## 2021-09-19 NOTE — Progress Notes (Deleted)
[place] NOTE  Patient Care Team: Philmore Pali, NP as PCP - General (Nurse Practitioner) Patrici Ranks, MD (Inactive) as PCP - Hematology/Oncology (Hematology and Oncology) Daneil Dolin, MD as Consulting Physician (Gastroenterology) Cammie Sickle, MD as Consulting Physician (Hematology and Oncology)  CHIEF COMPLAINTS/PURPOSE OF CONSULTATION:  Endometrial cancer  #  Oncology History Overview Note  # OCT 2015- Uterine ca- STAGE IV [multiple lung nodules]; Nov 2015S/p radical hysterectomy, BSO, pelvic lymph node dissection/vaginal biopsies/extensive pelvic disease including positive lymph nodes and positive distal vaginal biopsy.]; [[facility]]-endometroid G-2; LVI; positive Pelvic LN/vaginal Bx Jan-April 2016- carbo-Taxol x6 cycles Forestine Na; Dr.Penland]  # Oct 2016- colo- high grade dysplasia [Dr.Rourk; Dr.Jenkins s/p right hemi-colectomy]; last colo- may 2018;    # Genetic testing- [MSH6 mutation]/Lynch syndrome; EGD/colo every 2-3 qyears; urin cytology q6M  Histologic grade G2, histologic type endometrioid adenocarcinoma -----------------------------------------------------  DIAGNOSIS: Endometrioid endometrial cancer  STAGE: IV  ;GOALS: Control/palliative  CURRENT/MOST RECENT THERAPY surveillance    Endometrial cancer ([place])  07/12/2014 Imaging   CT C/A/P, pelvic adenopathy, multiple pulmonary nodules concerning for metastatic disease   07/30/2014 Initial Diagnosis   Endometrial cancer   07/31/2014 Pathology Results   endometrioid adenocarcinoma, G2, lymphovascular invasion present, positive pelvic lymph node and vaginal biopsy   07/31/2014 Definitive Surgery   radical hysterectomy, BSO, pelvic lymph node dissection and vaginal biopsies   09/25/2014 Procedure   Port-A-Cath placement in IR   10/18/2014 - 01/10/2015 Chemotherapy   Carboplatin/Taxol. First cycle given at [facility].  S/P 6 cycles total, 5 cycles given at [facility].   07/04/2015  Procedure   Colonoscopy by Dr. Gala Romney   07/04/2015 Pathology Results   Colon, polyp(s), vicinity of hepatic flexure - TUBULAR ADENOMA WITH FOCAL HIGH GRADE DYSPLASIA.    Procedure   Partial colectomy by Dr. Arnoldo Morale scheduled for 08/30/2015   10/31/2015 Genetic Testing   [facility] SYNDROME MSH6 Mutation   04/27/2016 Imaging   CT CAP- No acute process or evidence of metastatic disease in the chest. Right middle lobe pulmonary nodule is unchanged back to 11/20/2014, favoring a benign etiology   11/10/2016 Imaging   CT CAP- 1. No evidence of metastatic disease in the chest, abdomen or pelvis. 2. No evidence of local tumor recurrence at the ileocolic anastomosis in the right abdomen or in the pelvis. Decreased mild fat stranding at the base of the right mesentery, most consistent with postsurgical scarring. 3. Aortic atherosclerosis.   Adenocarcinoma of colon ([place])  07/04/2015 Pathology Results   Diagnosis 1. Colon, polyp(s), vicinity of hepatic flexure - TUBULAR ADENOMA WITH FOCAL HIGH GRADE DYSPLASIA. - NO INVASIVE CARCINOMA. 2. Colon, polyp(s), sigmoid - HYPERPLASTIC POLYP. - NO DYSPLASIA OR MALIGNANCY.   07/04/2015 Procedure   Colonoscopoy by Dr. Gala Romney- large polypoid colonic lesion.   08/30/2015 Definitive Surgery   Dr. Arnoldo Morale- right segmental colon resection   08/30/2015 Pathology Results   [id number] intramucosal adenocarcinoma of colon, 0.2 cm inding the laminal propria with negative resection margins and 0/17 lymph nodes.      HISTORY OF PRESENTING ILLNESS:  Julia Osborne 60 y.o.  female history of endometrial cancer stage IV/Lynch syndrome-current on surveillance is here for follow-up/review the results of the CT scan.  Patient denies any worsening shortness of breath or cough.  Appetite is good.  No weight loss.  Denies abdominal pain.  Unfortunately continues to smoke.  No blood in stools or black or stools.  No blood in urine.  Review of Systems  Constitutional:  Positive  for malaise/fatigue. Negative for chills, diaphoresis, fever and weight loss.  HENT:  Negative for sore throat.   Eyes:  Negative for double vision.  Respiratory:  Negative for cough, hemoptysis, sputum production, shortness of breath and wheezing.   Cardiovascular:  Negative for chest pain, palpitations, orthopnea and leg swelling.  Gastrointestinal:  Negative for blood in stool, constipation, diarrhea, heartburn, melena, nausea and vomiting.  Genitourinary:  Negative for dysuria, frequency and urgency.  Musculoskeletal:  Positive for back pain and joint pain.  Skin: Negative.  Negative for itching and rash.  Neurological:  Positive for tingling. Negative for dizziness, focal weakness, weakness and headaches.  Endo/Heme/Allergies:  Does not bruise/bleed easily.  Psychiatric/Behavioral:  Negative for depression. The patient is not nervous/anxious and does not have insomnia.     MEDICAL HISTORY:  Past Medical History:  Diagnosis Date   Adenocarcinoma of colon ([place]) 09/13/2015   Partial colon resection and chemo tx's.    Anemia    Anxiety    Asthma    Cancer ([place])    endometrial; cancer cells in intestine   COPD (chronic obstructive pulmonary disease) ([place])    no definite diagnosis   Depression    Diverticulitis    Dyspareunia 05/16/2015   Family history of cancer    Family history of kidney cancer    GERD (gastroesophageal reflux disease)    Glaucoma    Indigestion    Lynch syndrome    Neuropathy    feet and hands   Uterine fibroid    Vaginal dryness 05/16/2015   Vaginal itching 07/16/2015   Vaginal Pap smear, abnormal     SURGICAL HISTORY: Past Surgical History:  Procedure Laterality Date   ABDOMINAL HYSTERECTOMY     APPENDECTOMY     BIOPSY N/A 03/14/2015   Procedure: BIOPSY;  Surgeon: Daneil Dolin, MD;  Location: [facility];  Service: Endoscopy;  Laterality: N/A;  Gastric   COLONOSCOPY N/A 01/28/2017   Procedure: COLONOSCOPY;  Surgeon: Daneil Dolin, MD;  Location: [facility];  Service: Endoscopy;  Laterality: N/A;  11:30am   COLONOSCOPY WITH PROPOFOL N/A 03/14/2015   RMR: Internal hemorrhoids. colonic diverticulosis. Incomplete examination. Prepartation inadequate.   COLONOSCOPY WITH PROPOFOL N/A 07/04/2015   RMR: Colonic diverticulosis . Large polypoid lesion in the vicinity of the hepatic flexure status post saline-assisted piecmeal snare polypectomy  with ablation and tattooing as described. Sigmoid polyp removed as described above. sigmoid colon polyp hyperplastic, hepatic flexure polyp with TA with focal high grade dysplasia    COLONOSCOPY WITH PROPOFOL N/A 11/03/2018   Procedure: COLONOSCOPY WITH PROPOFOL;  Surgeon: Jonathon Bellows, MD;  Location: [facility] ENDOSCOPY;  Service: Gastroenterology;  Laterality: N/A;   COLONOSCOPY WITH PROPOFOL N/A 12/09/2020   Procedure: COLONOSCOPY WITH PROPOFOL;  Surgeon: Jonathon Bellows, MD;  Location: [facility] ENDOSCOPY;  Service: Gastroenterology;  Laterality: N/A;  COVID POSITIVE 10/30/2020   COLONOSCOPY WITH PROPOFOL N/A 01/20/2021   Procedure: COLONOSCOPY WITH PROPOFOL;  Surgeon: Jonathon Bellows, MD;  Location: [facility] ENDOSCOPY;  Service: Gastroenterology;  Laterality: N/A;   ENTEROSCOPY N/A 02/12/2020   Procedure: ENTEROSCOPY;  Surgeon: Jonathon Bellows, MD;  Location: [facility] ENDOSCOPY;  Service: Gastroenterology;  Laterality: N/A;   ESOPHAGEAL DILATION N/A 03/14/2015   Procedure: ESOPHAGEAL DILATION;  Surgeon: Daneil Dolin, MD;  Location: [facility];  Service: Endoscopy;  Laterality: N/AVenia Minks 84   ESOPHAGOGASTRODUODENOSCOPY N/A 05/19/2016   Procedure: ESOPHAGOGASTRODUODENOSCOPY (EGD);  Surgeon: Daneil Dolin, MD;  [facility];  Service: Endoscopy;  Laterality: N/A;  215   ESOPHAGOGASTRODUODENOSCOPY N/A 01/28/2017   Procedure: ESOPHAGOGASTRODUODENOSCOPY (EGD);  Surgeon: Daneil Dolin, MD;  [facility];  Service: Endoscopy;  Laterality: N/A;   ESOPHAGOGASTRODUODENOSCOPY (EGD) WITH  PROPOFOL N/A 03/14/2015   RMR: Mild erosive reflux esophagitis status post passage o f a Maloney dilator. Abnormal gastric mucosa of uncertain significance as described above. status post biopsy, benign   ESOPHAGOGASTRODUODENOSCOPY (EGD) WITH PROPOFOL N/A 11/18/2017   Procedure: ESOPHAGOGASTRODUODENOSCOPY (EGD) WITH PROPOFOL;  Surgeon: Daneil Dolin, MD;  [facility];  Service: Endoscopy;  Laterality: N/A;  12:15pm   ESOPHAGOGASTRODUODENOSCOPY (EGD) WITH PROPOFOL N/A 11/03/2018   Procedure: ESOPHAGOGASTRODUODENOSCOPY (EGD) WITH PROPOFOL;  Surgeon: Jonathon Bellows, MD;  Location: [facility] ENDOSCOPY;  Service: Gastroenterology;  Laterality: N/A;   GIVENS CAPSULE STUDY N/A 12/25/2019   Procedure: GIVENS CAPSULE STUDY;  Surgeon: Jonathon Bellows, MD;  Location: [facility] ENDOSCOPY;  Service: Gastroenterology;  Laterality: N/A;   MALONEY DILATION N/A 05/19/2016   Procedure: [name] DILATION;  Surgeon: Daneil Dolin, MD;  [facility];  Service: Endoscopy;  Laterality: N/A;   MALONEY DILATION N/A 01/28/2017   Procedure: [name] DILATION;  Surgeon: Daneil Dolin, MD;  [facility];  Service: Endoscopy;  Laterality: N/A;   MALONEY DILATION N/A 11/18/2017   Procedure: [name] DILATION;  Surgeon: Daneil Dolin, MD;  [facility];  Service: Endoscopy;  Laterality: N/A;   PARTIAL COLECTOMY  08/30/2015   polyp with adenocarcinoma   POLYPECTOMY N/A 07/04/2015   Procedure: POLYPECTOMY;  Surgeon: Daneil Dolin, MD;  Location: [facility];  Service: Endoscopy;  Laterality: N/A;   PORTACATH PLACEMENT Right 09/2014   TUBAL LIGATION      SOCIAL HISTORY: Social History   Socioeconomic History   Marital status: Married    Spouse name: Not on file   Number of children: 2   Years of education: Not on file   Highest education level: Not on file  Occupational History   Not on file  Tobacco Use   Smoking status: Every Day    Packs/day: 2.00    Years: 25.00    Pack years:  50.00    Types: Cigarettes   Smokeless tobacco: Never   Tobacco comments:    Is trying to [employer] off.  Vaping Use   Vaping Use: Never used  Substance and Sexual Activity   Alcohol use: No   Drug use: No   Sexual activity: Not Currently    Birth control/protection: Surgical    Comment: hyst  Other Topics Concern   Not on file  Social History Narrative   #  [place]ives in [place];self; smoking- 1/2 ppd; no alcohol; used to run machines;       FHx-Dad- MI; ? Cancer on autopsy; sisters/aunts- brain; lung cancer x2; oldest sister- kidney cancer      Pt has 57 year old son;daughter 65 years. Brothers- states to have spoken to her family re: importance of them being checked for lynch.    Social Determinants of Health   Financial Resource Strain: Not on file  Food Insecurity: Not on file  Transportation Needs: Not on file  Physical Activity: Not on file  Stress: Not on file  Social Connections: Not on file  Intimate Partner Violence: Not on file    FAMILY HISTORY:  Family History  Problem Relation Age of Onset   Other Mother        clot that went to heart, deceased age 42ss   Heart attack  Father        age 76s, deceased   Stroke Father    Cancer Father        "at death determined he was ate up with cancer"   Hypertension Sister    Kidney cancer Sister 71   Diabetes Brother    Hypertension Brother    Endometriosis Daughter    Heart attack Maternal Grandfather    Diabetes Sister    Brain cancer Paternal Aunt    Cancer Paternal Uncle        NOS   Cancer Paternal Uncle        NOS   Cancer Paternal Uncle        NOS   Colon cancer Neg Hx     ALLERGIES:  is allergic to codeine.  MEDICATIONS:  Current Outpatient Medications  Medication Sig Dispense Refill   albuterol (PROVENTIL HFA;VENTOLIN HFA) 108 (90 Base) MCG/ACT inhaler Inhale 2 puffs into the lungs every 2 (two) hours as needed for wheezing or shortness of breath (cough). 1 Inhaler 3    albuterol (PROVENTIL) (2.5 MG/3ML) 0.083% nebulizer solution Take 3 mLs (2.5 mg total) by nebulization every 6 (six) hours as needed for wheezing or shortness of breath. 75 mL 12   ALPRAZolam (XANAX) 0.25 MG tablet Take 0.125 mg by mouth daily as needed for anxiety.      atorvastatin (LIPITOR) 10 MG tablet Take 1 tablet by mouth at bedtime.     citalopram (CELEXA) 40 MG tablet Take 40 mg by mouth at bedtime.  2   dicyclomine (BENTYL) 10 MG capsule Take 1 capsule (10 mg total) by mouth 4 (four) times daily -  before meals and at bedtime. 120 capsule 2   gabapentin (NEURONTIN) 300 MG capsule Take 300 mg by mouth 2 (two) times daily.     loratadine (CLARITIN) 10 MG tablet Take 10 mg by mouth daily as needed for allergies.      LUMIGAN 0.01 % SOLN Place 1 drop into the right eye at bedtime.     Naphazoline-Pheniramine (ALLERGY EYE OP) Place 1-2 drops into both eyes 2 (two) times daily as needed (ITCHY, WATERY EYES).     omeprazole (PRILOSEC) 40 MG capsule Take 1 capsule (40 mg total) by mouth daily. (Patient not taking: Reported on 04/09/2021) 90 capsule 0   ondansetron (ZOFRAN ODT) 4 MG disintegrating tablet Take 1 tablet (4 mg total) by mouth every 8 (eight) hours as needed. 20 tablet 0   ondansetron (ZOFRAN) 8 MG tablet Take 8 mg by mouth daily as needed (CAR SICKNESS).     pantoprazole (PROTONIX) 40 MG tablet Take 1 tablet (40 mg total) by mouth daily. 30 tablet 1   PAZEO 0.7 % SOLN Place 1 drop into both eyes every morning.     SIMBRINZA 1-0.2 % SUSP Apply 1 drop to eye 2 (two) times daily.     sodium chloride (OCEAN) 0.65 % SOLN nasal spray Place 1 spray into both nostrils as needed for congestion.     sucralfate (CARAFATE) 1 GM/10ML suspension Take 10 mLs (1 g total) by mouth 4 (four) times daily. 420 mL 1   traMADol (ULTRAM) 50 MG tablet TAKE 1 TABLET BY MOUTH DAILY AS NEEDED. 30 tablet 0   traZODone (DESYREL) 50 MG tablet Take 50 mg by mouth at bedtime.     No current  facility-administered medications for this visit.      Marland Kitchen  PHYSICAL EXAMINATION: ECOG PERFORMANCE STATUS: 0 - Asymptomatic  There were  no vitals filed for this visit.  There were no vitals filed for this visit.   Physical Exam Constitutional:      Comments: Patient is alone.  HENT:     Head: Normocephalic and atraumatic.     Mouth/Throat:     Pharynx: No oropharyngeal exudate.  Eyes:     Pupils: Pupils are equal, round, and reactive to light.  Cardiovascular:     Rate and Rhythm: Normal rate and regular rhythm.  Pulmonary:     Effort: No respiratory distress.     Breath sounds: No wheezing.  Abdominal:     General: Bowel sounds are normal. There is no distension.     Palpations: Abdomen is soft. There is no mass.     Tenderness: There is no abdominal tenderness. There is no guarding or rebound.  Musculoskeletal:        General: No tenderness. Normal range of motion.     Cervical back: Normal range of motion and neck supple.  Skin:    General: Skin is warm.     Comments:    Neurological:     Mental Status: She is alert and oriented to person, place, and time.  Psychiatric:        Mood and Affect: Affect normal.     LABORATORY DATA:  I have reviewed the data as listed Lab Results  Component Value Date   WBC 13.6 (H) 04/09/2021   HGB 14.2 04/09/2021   HCT 40.8 04/09/2021   MCV 89.7 04/09/2021   PLT 261 04/09/2021   Recent Labs    10/31/20 1946 03/13/21 0949 04/09/21 1115  NA 138 137 137  K 3.4* 4.0 3.4*  CL 106 102 97*  CO2 21* 28 28  GLUCOSE 93 91 135*  BUN '11 8 10  ' CREATININE 0.78 0.54 0.79  CALCIUM 8.9 8.7* 9.2  GFRNONAA >60 >60 >60  PROT  --  7.4 7.5  ALBUMIN  --  3.8 4.0  AST  --  17 18  ALT  --  19 18  ALKPHOS  --  53 57  BILITOT  --  0.5 0.7     RADIOGRAPHIC STUDIES: I have personally reviewed the radiological images as listed and agreed with the findings in the report. No results found.  ASSESSMENT & PLAN:   No problem-specific  Assessment & Plan notes found for this encounter.    All questions were answered. The patient knows to call the clinic with any problems, questions or concerns.     Cammie Sickle, MD 09/19/2021 1:13 PM

## 2021-09-19 NOTE — Assessment & Plan Note (Deleted)
#   Uterine Cancer endometrioid type grade 2; stage IV [lung nodules at presentation 2015/]-February 2022- CT scan abdomen pelvis/chest-negative for any obvious recurrence.   STABLE. No clinical evidence of recurrence.   # Lynch syndrome Ozzie Hoyle of high-grade polyp/status post colectomy: Last colonoscopy-May 2022;  repeat on annual basis;  EGD- May 2021--reviewed.STABLE Urine cytology pending.   #Active smoker on Wellbutrin-discussed smoking cessation; DECLINES.   #Peripheral neuropathy grade 2-3; STABLE;  continue tramadol as needed.  # Constipation- on amitizia-STABLE  # # Patient is to unvaccinated to Covid-19 [s/p covid april2022]- DECLINED  # port flush- q 3 M   DISPOSITION: # port flush every 3 months # follow up in 6 months/labs- cbc/cmp/ca-125/urine cytology; Dr.B  # I reviewed the blood work- with the patient in detail; also reviewed the imaging independently [as summarized above]; and with the patient in detail.

## 2021-10-23 ENCOUNTER — Ambulatory Visit: Payer: Medicare Other | Admitting: Gastroenterology

## 2021-10-23 ENCOUNTER — Other Ambulatory Visit: Payer: Self-pay

## 2021-10-30 ENCOUNTER — Inpatient Hospital Stay: Payer: Medicare Other | Admitting: Internal Medicine

## 2021-10-30 ENCOUNTER — Inpatient Hospital Stay: Payer: Medicare Other

## 2021-11-03 ENCOUNTER — Telehealth: Payer: Self-pay | Admitting: Gastroenterology

## 2021-11-03 NOTE — Telephone Encounter (Signed)
Pt is calling with constipation wondering if she should make an appointment to be seen. Her concern is if it is connected to Lynch Syndrome.

## 2021-11-04 NOTE — Telephone Encounter (Signed)
Dr. Vicente Males, patient was already scheduled an appointment with you by Caryl Pina. However, do you have any suggestions as what she can do in the meantime for her constipation? Please advise.

## 2021-11-05 NOTE — Telephone Encounter (Signed)
Called patient back to let her know what Dr. Vicente Males recommended for her and she stated that she will follow and if it didn't work, then she would call us back in a week to schedule a video visit.

## 2021-11-20 ENCOUNTER — Encounter: Payer: Self-pay | Admitting: Gastroenterology

## 2021-11-20 ENCOUNTER — Ambulatory Visit (INDEPENDENT_AMBULATORY_CARE_PROVIDER_SITE_OTHER): Payer: Medicare Other | Admitting: Gastroenterology

## 2021-11-20 ENCOUNTER — Other Ambulatory Visit: Payer: Self-pay

## 2021-11-20 VITALS — BP 112/71 | HR 62 | Temp 98.8°F | Wt 140.0 lb

## 2021-11-20 DIAGNOSIS — K5900 Constipation, unspecified: Secondary | ICD-10-CM | POA: Diagnosis not present

## 2021-11-20 DIAGNOSIS — Z1509 Genetic susceptibility to other malignant neoplasm: Secondary | ICD-10-CM

## 2021-11-20 DIAGNOSIS — R6889 Other general symptoms and signs: Secondary | ICD-10-CM

## 2021-11-20 MED ORDER — TRULANCE 3 MG PO TABS: 1.0000 | ORAL_TABLET | Freq: Every day | ORAL | 6 refills | Status: DC

## 2021-11-20 NOTE — Addendum Note (Signed)
Addended by: Wayna Chalet on: 11/20/2021 03:58 PM ? ? Modules accepted: Orders ? ?

## 2021-11-20 NOTE — Patient Instructions (Signed)
We will send the referral to the dermatologist and they will contact you to schedule your appointment. ?

## 2021-11-20 NOTE — Progress Notes (Signed)
?  ?Jonathon Bellows MD, MRCP(U.K) ?[address]  ?[address]  ?[place], [place] [address]  ?Main: [phone]  ?Fax: [phone] ? ? ?Primary Care Physician: Philmore Pali, NP ? ?Primary Gastroenterologist:  Dr. Jonathon Bellows  ? ?Chief Complaint  ?Patient presents with  ? Constipation  ? ? ?HPI: Julia Osborne is a 61 y.o. female ? ?Summary of history : ?  ?Initially  seen in February 2020 for dysphagia and Lynch syndrome.  She has a history of endometrial cancer, colon cancer, GERD.  She follows with Dr. Rogue Bussing  in oncology. She has been previously established with [facility] gastroenterology.  Carries a history of dysphagia, GERD and CA of her colon, status post right hemicolectomy.  History of stage IV uterine cancer.  Colon polyp resected in October 2016 showed an adenoma with focal high-grade dysplasia.  ?10/24/18: H pylori - negative ?11/03/2018: EGD: Normal duodenal . Gastric bx shows mild chronic gastritis .  ?Colonoscopy : Hyperplastic rectal polyp. ?12/25/2019: Capsule study of the small bowel showed a few tiny polyps in the proximal small bowel were noted.  Probably 2 to 3 mm in size.  A few lesions suggestive of small bowel AVMs were seen in the distal small bowel.  Nonbleeding.  ?02/12/2020: Push enteroscopy polyps in the first portion of the duodenum were noted 2 to 4 mm in size.  Resected and retrieved.  Biopsy report demonstrated peptic duodenitis  ?10/28/2020: CT scan of the abdomen and pelvis with contrast no evidence of recurring malignancy ?12/09/2020: Colonoscopy: Large amount of stool seen in the rectum sigmoid colon interfering with visualization, repeat colonoscopy on 01/20/2021 8 mm polyp resected in the sigmoid colon otherwise examination was normal.  The polyp was a sessile serrated adenoma ? 11/28/2020: Urine analysis no RBCs seen  ?  ?Interval history 04/09/2021-11/20/2021 ?  ?She states that the main reason she is here today to see me is for constipation.  She previously had issues with constipation  was treated with Trulance that helped her a lot.  She ran out of her medications stopped taking it and subsequently had recurrence of symptoms.  Her husband left her after many years and she has not been eating well lost 30 to 40 pounds recently.  Has a lot of mucus in her throat still smokes.  Thinks she is losing weight because of all the stress she is going through ?  ? ? ?Current Outpatient Medications  ?Medication Sig Dispense Refill  ? albuterol (PROVENTIL HFA;VENTOLIN HFA) 108 (90 Base) MCG/ACT inhaler Inhale 2 puffs into the lungs every 2 (two) hours as needed for wheezing or shortness of breath (cough). 1 Inhaler 3  ? albuterol (PROVENTIL) (2.5 MG/3ML) 0.083% nebulizer solution Take 3 mLs (2.5 mg total) by nebulization every 6 (six) hours as needed for wheezing or shortness of breath. 75 mL 12  ? ALPRAZolam (XANAX) 0.25 MG tablet Take 0.125 mg by mouth daily as needed for anxiety.     ? atorvastatin (LIPITOR) 20 MG tablet Take 20 mg by mouth at bedtime.    ? escitalopram (LEXAPRO) 20 MG tablet Take 20 mg by mouth daily.    ? gabapentin (NEURONTIN) 300 MG capsule Take 300 mg by mouth 2 (two) times daily.    ? loratadine (CLARITIN) 10 MG tablet Take 10 mg by mouth daily as needed for allergies.     ? LUMIGAN 0.01 % SOLN Place 1 drop into the right eye at bedtime.    ? Naphazoline-Pheniramine (ALLERGY EYE OP) Place  1-2 drops into both eyes 2 (two) times daily as needed (ITCHY, WATERY EYES).    ? omeprazole (PRILOSEC) 40 MG capsule Take 1 capsule (40 mg total) by mouth daily. 90 capsule 0  ? ondansetron (ZOFRAN ODT) 4 MG disintegrating tablet Take 1 tablet (4 mg total) by mouth every 8 (eight) hours as needed. 20 tablet 0  ? ondansetron (ZOFRAN) 8 MG tablet Take 8 mg by mouth daily as needed (CAR SICKNESS).    ? PAZEO 0.7 % SOLN Place 1 drop into both eyes every morning.    ? SIMBRINZA 1-0.2 % SUSP Apply 1 drop to eye 2 (two) times daily.    ? sodium chloride (OCEAN) 0.65 % SOLN nasal spray Place 1 spray into  both nostrils as needed for congestion.    ? traZODone (DESYREL) 50 MG tablet Take 50 mg by mouth at bedtime.    ? ?No current facility-administered medications for this visit.  ? ? ?Allergies as of 11/20/2021 - Review Complete 11/20/2021  ?Allergen Reaction Noted  ? Codeine Rash 08/25/2011  ? ? ?ROS: ? ?General: Negative for anorexia, weight loss, fever, chills, fatigue, weakness. ?ENT: Negative for hoarseness, difficulty swallowing , nasal congestion. ?CV: Negative for chest pain, angina, palpitations, dyspnea on exertion, peripheral edema.  ?Respiratory: Negative for dyspnea at rest, dyspnea on exertion, cough, sputum, wheezing.  ?GI: See history of present illness. ?GU:  Negative for dysuria, hematuria, urinary incontinence, urinary frequency, nocturnal urination.  ?Endo: Negative for unusual weight change.  ?  ?Physical Examination: ? ? BP 112/71   Pulse 62   Temp 98.8 ?F (37.1 ?C) (Oral)   Wt 140 lb (63.5 kg)   BMI 24.80 kg/m?  ? ?General: Well-nourished, well-developed in no acute distress.  ?Eyes: No icterus. Conjunctivae pink. ?Mouth: Oropharyngeal mucosa moist and pink , no lesions erythema or exudate. ?Neuro: Alert and oriented x 3.  Grossly intact. ?Skin: Warm and dry, no jaundice.   ?Psych: Alert and cooperative, normal mood and affect. ? ? ?Imaging Studies: ?No results found. ? ?Assessment and Plan:  ? ?Julia Osborne is a 61 y.o. y/o female here to follow up for abdominal pain.  She has a history of Lynch syndrome.   History of colon cancer with right hemicolectomy, uterine cancer.  History of IBS constipation responded well to Trulance in the past ran out of her medication and has had recurrence of constipation.  Recent financial issues and bilateral issues which could lead to a lot of stress in her and that she has lost about 30 pounds.  I explained to her that we are due for her endoscopy and colonoscopy which we shall proceed with if negative may need to decide if she will need CT scan of  chest abdomen pelvis in view of unintentional weight loss.  She also has a follow-up with Dr. Rogue Bussing which can also discuss the same ? ? ?Plan  ?She requires annual skin exam , outpatient colonoscopy and EGD due in May 2023 and annual urine analysis next due in March 202 ? Continue Trulance for constipation refills will be provided ? ? ?I have discussed alternative options, risks & benefits,  which include, but are not limited to, bleeding, infection, perforation,respiratory complication & drug reaction.  The patient agrees with this plan & written consent will be obtained.   ? ? ?I have discussed alternative options, risks & benefits,  which include, but are not limited to, bleeding, infection, perforation,respiratory complication & drug reaction.  The patient agrees with  this plan & written consent will be obtained.   ? ?Dr Jonathon Bellows  MD,MRCP [facility]) ?Follow up in 3 months ?

## 2021-11-20 NOTE — Addendum Note (Signed)
Addended by: Wayna Chalet on: 11/20/2021 03:50 PM ? ? Modules accepted: Orders ? ?

## 2021-11-25 ENCOUNTER — Telehealth: Payer: Self-pay | Admitting: *Deleted

## 2021-11-25 ENCOUNTER — Inpatient Hospital Stay: Payer: Medicare Other | Admitting: Internal Medicine

## 2021-11-25 ENCOUNTER — Inpatient Hospital Stay: Payer: Medicare Other

## 2021-11-25 NOTE — Telephone Encounter (Signed)
Patient needs to reschedule appointment. ?

## 2021-12-09 ENCOUNTER — Encounter: Payer: Self-pay | Admitting: Gastroenterology

## 2021-12-10 ENCOUNTER — Ambulatory Visit
Admission: RE | Admit: 2021-12-10 | Discharge: 2021-12-10 | Disposition: A | Discharge status: Home / Self Care | Payer: Medicare Other | Attending: Gastroenterology | Admitting: Gastroenterology

## 2021-12-10 ENCOUNTER — Ambulatory Visit: Payer: Medicare Other | Admitting: Registered Nurse

## 2021-12-10 ENCOUNTER — Encounter
Admission: RE | Disposition: A | Discharge status: Home / Self Care | Payer: Self-pay | Source: Home / Self Care | Attending: Gastroenterology

## 2021-12-10 DIAGNOSIS — K5900 Constipation, unspecified: Secondary | ICD-10-CM

## 2021-12-10 DIAGNOSIS — R6889 Other general symptoms and signs: Secondary | ICD-10-CM

## 2021-12-10 DIAGNOSIS — K31A0 Gastric intestinal metaplasia, unspecified: Secondary | ICD-10-CM | POA: Diagnosis not present

## 2021-12-10 DIAGNOSIS — K31A11 Gastric intestinal metaplasia without dysplasia, involving the antrum: Secondary | ICD-10-CM

## 2021-12-10 DIAGNOSIS — Z980 Intestinal bypass and anastomosis status: Secondary | ICD-10-CM | POA: Diagnosis not present

## 2021-12-10 DIAGNOSIS — K219 Gastro-esophageal reflux disease without esophagitis: Secondary | ICD-10-CM | POA: Insufficient documentation

## 2021-12-10 DIAGNOSIS — F418 Other specified anxiety disorders: Secondary | ICD-10-CM | POA: Insufficient documentation

## 2021-12-10 DIAGNOSIS — Z85038 Personal history of other malignant neoplasm of large intestine: Secondary | ICD-10-CM | POA: Insufficient documentation

## 2021-12-10 DIAGNOSIS — Z1211 Encounter for screening for malignant neoplasm of colon: Secondary | ICD-10-CM | POA: Diagnosis not present

## 2021-12-10 DIAGNOSIS — F17210 Nicotine dependence, cigarettes, uncomplicated: Secondary | ICD-10-CM | POA: Insufficient documentation

## 2021-12-10 DIAGNOSIS — K3189 Other diseases of stomach and duodenum: Secondary | ICD-10-CM | POA: Diagnosis not present

## 2021-12-10 DIAGNOSIS — K640 First degree hemorrhoids: Secondary | ICD-10-CM | POA: Insufficient documentation

## 2021-12-10 DIAGNOSIS — Z1509 Genetic susceptibility to other malignant neoplasm: Secondary | ICD-10-CM | POA: Insufficient documentation

## 2021-12-10 DIAGNOSIS — K649 Unspecified hemorrhoids: Secondary | ICD-10-CM | POA: Diagnosis not present

## 2021-12-10 DIAGNOSIS — K253 Acute gastric ulcer without hemorrhage or perforation: Secondary | ICD-10-CM | POA: Diagnosis not present

## 2021-12-10 HISTORY — PX: ESOPHAGOGASTRODUODENOSCOPY: SHX5428

## 2021-12-10 HISTORY — PX: COLONOSCOPY WITH PROPOFOL: SHX5780

## 2021-12-10 SURGERY — COLONOSCOPY WITH PROPOFOL
Anesthesia: General

## 2021-12-10 MED ORDER — SODIUM CHLORIDE 0.9 % IV SOLN
INTRAVENOUS | Status: DC
  Administered 2021-12-10: 20 mL/h via INTRAVENOUS

## 2021-12-10 MED ORDER — PROPOFOL 500 MG/50ML IV EMUL
INTRAVENOUS | Status: DC | PRN
  Administered 2021-12-10: 150 ug/kg/min via INTRAVENOUS

## 2021-12-10 MED ORDER — LIDOCAINE HCL (CARDIAC) PF 100 MG/5ML IV SOSY
PREFILLED_SYRINGE | INTRAVENOUS | Status: DC | PRN
  Administered 2021-12-10: 100 mg via INTRAVENOUS

## 2021-12-10 MED ORDER — ATROPINE SULFATE 1 MG/ML IV SOLN
INTRAVENOUS | Status: DC | PRN
  Administered 2021-12-10: .1 mg via INTRAVENOUS

## 2021-12-10 MED ORDER — GLYCOPYRROLATE 0.2 MG/ML IJ SOLN
INTRAMUSCULAR | Status: DC | PRN
  Administered 2021-12-10: .2 mg via INTRAVENOUS

## 2021-12-10 MED ORDER — PROPOFOL 10 MG/ML IV BOLUS
INTRAVENOUS | Status: DC | PRN
  Administered 2021-12-10: 30 mg via INTRAVENOUS
  Administered 2021-12-10: 60 mg via INTRAVENOUS
  Administered 2021-12-10: 10 mg via INTRAVENOUS

## 2021-12-10 NOTE — Anesthesia Postprocedure Evaluation (Signed)
Anesthesia Post Note ? ?Patient: Julia Osborne ? ?Procedure(s) Performed: COLONOSCOPY WITH PROPOFOL ?ESOPHAGOGASTRODUODENOSCOPY (EGD) ? ?Patient location during evaluation: PACU ?Anesthesia Type: General ?Level of consciousness: awake and alert, oriented and patient cooperative ?Pain management: pain level controlled ?Vital Signs Assessment: post-procedure vital signs reviewed and stable ?Respiratory status: spontaneous breathing, nonlabored ventilation and respiratory function stable ?Cardiovascular status: blood pressure returned to baseline and stable ?Postop Assessment: adequate PO intake ?Anesthetic complications: no ? ? ?No notable events documented. ? ? ?Last Vitals:  ?Vitals:  ? 12/10/21 1049 12/10/21 1059  ?BP: (!) 159/89 (!) 154/88  ?Pulse: 82 80  ?Resp: 17 15  ?Temp:    ?SpO2: 100% 100%  ?  ?Last Pain:  ?Vitals:  ? 12/10/21 1059  ?TempSrc:   ?PainSc: 0-No pain  ? ? ?  ?  ?  ?  ?  ?  ? ?Darrin Nipper ? ? ? ? ?

## 2021-12-10 NOTE — Anesthesia Preprocedure Evaluation (Addendum)
Anesthesia Evaluation  ?Patient identified by MRN, date of birth, ID band ?Patient awake ? ? ? ?Reviewed: ?Allergy & Precautions, NPO status , Patient's Chart, lab work & pertinent test results ? ?History of Anesthesia Complications ?(+) PONV and history of anesthetic complications ? ?Airway ?Mallampati: I ? ? ?Neck ROM: Full ? ? ? Dental ? ? ?Missing few molars:   ?Pulmonary ?asthma , COPD, Current Smoker (attempting to quit) and Patient abstained from smoking.,  ?  ?Pulmonary exam normal ?breath sounds clear to auscultation ? ? ? ? ? ? Cardiovascular ?Exercise Tolerance: Good ?Normal cardiovascular exam ?Rhythm:Regular Rate:Normal ? ?ECG 04/09/21:  ?Normal sinus rhythm ?Cannot rule out Anterior infarct , age undetermined ?  ?Neuro/Psych ?PSYCHIATRIC DISORDERS Anxiety Depression  Neuromuscular disease (neuropathy)   ? GI/Hepatic ?GERD  ,Colon CA ?  ?Endo/Other  ? ? Renal/GU ?negative Renal ROS  ? ?  ?Musculoskeletal ? ? Abdominal ?  ?Peds ? Hematology ? ?(+) Blood dyscrasia, anemia , Lynch syndrome   ?Anesthesia Other Findings ? ? Reproductive/Obstetrics ?Endometrial CA; fibroids ? ?  ? ? ? ? ? ? ? ? ? ? ? ? ? ?  ?  ? ? ? ? ? ? ? ?Anesthesia Physical ?Anesthesia Plan ? ?ASA: 3 ? ?Anesthesia Plan: General  ? ?Post-op Pain Management:   ? ?Induction: Intravenous ? ?PONV Risk Score and Plan: 3 and Propofol infusion, TIVA and Treatment may vary due to age or medical condition ? ?Airway Management Planned: Natural Airway ? ?Additional Equipment:  ? ?Intra-op Plan:  ? ?Post-operative Plan:  ? ?Informed Consent: I have reviewed the patients History and Physical, chart, labs and discussed the procedure including the risks, benefits and alternatives for the proposed anesthesia with the patient or authorized representative who has indicated his/her understanding and acceptance.  ? ? ? ? ? ?Plan Discussed with: CRNA ? ?Anesthesia Plan Comments: (LMA/GETA backup discussed.  Patient consented  for risks of anesthesia including but not limited to:  ?- adverse reactions to medications ?- damage to eyes, teeth, lips or other oral mucosa ?- nerve damage due to positioning  ?- sore throat or hoarseness ?- damage to heart, brain, nerves, lungs, other parts of body or loss of life ? ?Informed patient about role of CRNA in peri- and intra-operative care.  Patient voiced understanding.)  ? ? ? ? ? ? ?Anesthesia Quick Evaluation ? ?

## 2021-12-10 NOTE — Op Note (Signed)
[facility] ?Gastroenterology ?Patient Name: Julia Osborne ?Procedure Date: 12/10/2021 10:05 AM ?MRN: [id number] ?Account #: [id number] ?Date of Birth: 11-Sep-1961 ?Admit Type: Outpatient ?Age: 61 ?Room: [facility] ENDO ROOM 3 ?Gender: Female ?Note Status: Finalized ?Instrument Name: Colonoscope [id number] ?Procedure:             Colonoscopy ?Indications:           High risk colon cancer surveillance: Personal history  ?                       of hereditary nonpolyposis colorectal cancer (Lynch  ?                       Syndrome) ?Providers:             Jonathon Bellows MD, MD ?Referring MD:          Philmore Pali (Referring MD) ?Medicines:             Monitored Anesthesia Care ?Complications:         No immediate complications. ?Procedure:             Pre-Anesthesia Assessment: ?                       - Prior to the procedure, a History and Physical was  ?                       performed, and patient medications, allergies and  ?                       sensitivities were reviewed. The patient's tolerance  ?                       of previous anesthesia was reviewed. ?                       - The risks and benefits of the procedure and the  ?                       sedation options and risks were discussed with the  ?                       patient. All questions were answered and informed  ?                       consent was obtained. ?                       - ASA Grade Assessment: III - A patient with severe  ?                       systemic disease. ?                       After obtaining informed consent, the colonoscope was  ?                       passed under direct vision. Throughout the procedure,  ?                       the patient's blood pressure, pulse, and  oxygen  ?                       saturations were monitored continuously. The  ?                       Colonoscope was introduced through the anus and  ?                       advanced to the the cecum, identified by the  ?                       appendiceal  orifice. The colonoscopy was performed  ?                       with ease. The patient tolerated the procedure well.  ?                       The quality of the bowel preparation was excellent. ?Findings: ?     The perianal and digital rectal examinations were normal. Pertinent  ?     negatives include normal sphincter tone. ?     There was evidence of a prior end-to-side colo-colonic anastomosis in  ?     the transverse colon. This was patent and was characterized by healthy  ?     appearing mucosa. The anastomosis was traversed. ?     Non-bleeding internal hemorrhoids were found during retroflexion. The  ?     hemorrhoids were large and Grade I (internal hemorrhoids that do not  ?     prolapse). ?     The exam was otherwise without abnormality on direct and retroflexion  ?     views. ?Impression:            - Patent end-to-side colo-colonic anastomosis,  ?                       characterized by healthy appearing mucosa. ?                       - Non-bleeding internal hemorrhoids. ?                       - The examination was otherwise normal on direct and  ?                       retroflexion views. ?                       - No specimens collected. ?Recommendation:        - Discharge patient to home (with escort). ?                       - Resume previous diet. ?                       - Continue present medications. ?                       - Repeat colonoscopy in 1 year for surveillance. ?                       - Recommedn  CT scan of chest/aqbdomen and pelvis if  ?                       continues to lose weight . She has a follow up with  ?                       her oncologist in a few days and have advised her to  ?                       discuss the same ?Procedure Code(s):     --- Professional --- ?                       [phone], Colonoscopy, flexible; diagnostic, including  ?                       collection of specimen(s) by brushing or washing, when  ?                       performed (separate  procedure) ?Diagnosis Code(s):     --- Professional --- ?                       R11.657, Personal history of other malignant neoplasm  ?                       of large intestine ?                       Z15.09, Genetic susceptibility to other malignant  ?                       neoplasm ?                       Z98.0, Intestinal bypass and anastomosis status ?                       K64.0, First degree hemorrhoids ?CPT copyright 2019 American Medical Association. All rights reserved. ?The codes documented in this report are preliminary and upon coder review may  ?be revised to meet current compliance requirements. ?Jonathon Bellows, MD ?Jonathon Bellows MD, MD ?12/10/2021 10:38:26 AM ?This report has been signed electronically. ?Number of Addenda: 0 ?Note Initiated On: 12/10/2021 10:05 AM ?Scope Withdrawal Time: 0 hours 6 minutes 24 seconds  ?Total Procedure Duration: 0 hours 11 minutes 15 seconds  ?Estimated Blood Loss:  Estimated blood loss: none. ?     [facility] ?

## 2021-12-10 NOTE — Transfer of Care (Signed)
Immediate Anesthesia Transfer of Care Note ? ?Patient: Julia Osborne ? ?Procedure(s) Performed: COLONOSCOPY WITH PROPOFOL ?ESOPHAGOGASTRODUODENOSCOPY (EGD) ? ?Patient Location: Endoscopy Unit ? ?Anesthesia Type:General ? ?Level of Consciousness: drowsy ? ?Airway & Oxygen Therapy: Patient Spontanous Breathing ? ?Post-op Assessment: Report given to RN and Post -op Vital signs reviewed and stable ? ?Post vital signs: Reviewed and stable ? ?Last Vitals:  ?Vitals Value Taken Time  ?BP 141/78 12/10/21 1039  ?Temp 36.1 ?C 12/10/21 1039  ?Pulse 69 12/10/21 1040  ?Resp 13 12/10/21 1040  ?SpO2 98 % 12/10/21 1040  ?Vitals shown include unvalidated device data. ? ?Last Pain:  ?Vitals:  ? 12/10/21 1039  ?TempSrc: Temporal  ?PainSc: Asleep  ?   ? ?  ? ?Complications: No notable events documented. ?

## 2021-12-10 NOTE — Op Note (Signed)
[facility] ?Gastroenterology ?Patient Name: Julia Osborne ?Procedure Date: 12/10/2021 10:05 AM ?MRN: [id number] ?Account #: [id number] ?Date of Birth: [birth year] ?Admit Type: Outpatient ?Age: 61 ?Room: [facility] ENDO ROOM 3 ?Gender: Female ?Note Status: Finalized ?Instrument Name: Upper-Endoscope [id number] ?Procedure:             Upper GI endoscopy ?Indications:           Surveillance for malignancy secondary to Lynch Syndrome ?Providers:             Jonathon Bellows MD, MD ?Referring MD:          Philmore Pali (Referring MD) ?Medicines:             Monitored Anesthesia Care ?Complications:         No immediate complications. ?Procedure:             Pre-Anesthesia Assessment: ?                       - Prior to the procedure, a History and Physical was  ?                       performed, and patient medications, allergies and  ?                       sensitivities were reviewed. The patient's tolerance  ?                       of previous anesthesia was reviewed. ?                       - The risks and benefits of the procedure and the  ?                       sedation options and risks were discussed with the  ?                       patient. All questions were answered and informed  ?                       consent was obtained. ?                       - ASA Grade Assessment: III - A patient with severe  ?                       systemic disease. ?                       After obtaining informed consent, the endoscope was  ?                       passed under direct vision. Throughout the procedure,  ?                       the patient's blood pressure, pulse, and oxygen  ?                       saturations were monitored continuously. The Endoscope  ?  was introduced through the mouth, and advanced to the  ?                       third part of duodenum. The upper GI endoscopy was  ?                       accomplished with ease. The patient tolerated the  ?                       procedure  well. ?Findings: ?     The esophagus was normal. ?     The examined duodenum was normal. ?     Localized nodular mucosa was found in the gastric antrum. Biopsies were  ?     taken with a cold forceps for histology. ?     The cardia and gastric fundus were normal on retroflexion. ?Impression:            - Normal esophagus. ?                       - Normal examined duodenum. ?                       - Nodular mucosa in the gastric antrum. Biopsied. ?Recommendation:        - Await pathology results. ?                       - Perform a colonoscopy today. ?Procedure Code(s):     --- Professional --- ?                       [phone], Esophagogastroduodenoscopy, flexible,  ?                       transoral; with biopsy, single or multiple ?Diagnosis Code(s):     --- Professional --- ?                       K31.89, Other diseases of stomach and duodenum ?                       Z15.09, Genetic susceptibility to other malignant  ?                       neoplasm ?CPT copyright 2019 American Medical Association. All rights reserved. ?The codes documented in this report are preliminary and upon coder review may  ?be revised to meet current compliance requirements. ?Jonathon Bellows, MD ?Jonathon Bellows MD, MD ?12/10/2021 10:22:51 AM ?This report has been signed electronically. ?Number of Addenda: 0 ?Note Initiated On: 12/10/2021 10:05 AM ?Estimated Blood Loss:  Estimated blood loss: none. ?     [facility] ?

## 2021-12-10 NOTE — H&P (Signed)
? ? ? ?Jonathon Bellows, MD ?[address] [place], [place], [place], [address] ?[address], [place], [place], [place], [address] ?Phone: [phone]  ?Fax: [phone] ? ?Primary Care Physician:  Philmore Pali, NP ? ? ?Pre-Procedure History & Physical: ?HPI:  Julia Osborne is a 61 y.o. female is here for an endoscopy and colonoscopy  ?  ?Past Medical History:  ?Diagnosis Date  ? Adenocarcinoma of colon ([place]) 09/13/2015  ? Partial colon resection and chemo tx's.   ? Anemia   ? Anxiety   ? Asthma   ? Cancer [facility])   ? endometrial; cancer cells in intestine  ? COPD (chronic obstructive pulmonary disease) ([place])   ? no definite diagnosis  ? Depression   ? Diverticulitis   ? Dyspareunia 05/16/2015  ? Family history of cancer   ? Family history of kidney cancer   ? GERD (gastroesophageal reflux disease)   ? Glaucoma   ? Indigestion   ? Lynch syndrome   ? Neuropathy   ? feet and hands  ? Uterine fibroid   ? Vaginal dryness 05/16/2015  ? Vaginal itching 07/16/2015  ? Vaginal Pap smear, abnormal   ? ? ?Past Surgical History:  ?Procedure Laterality Date  ? ABDOMINAL HYSTERECTOMY    ? APPENDECTOMY    ? BIOPSY N/A 03/14/2015  ? Procedure: BIOPSY;  Surgeon: Daneil Dolin, MD;  Location: [facility];  Service: Endoscopy;  Laterality: N/A;  Gastric  ? COLONOSCOPY N/A 01/28/2017  ? Procedure: COLONOSCOPY;  Surgeon: Daneil Dolin, MD;  [facility];  Service: Endoscopy;  Laterality: N/A;  11:30am  ? COLONOSCOPY WITH PROPOFOL N/A 03/14/2015  ? RMR: Internal hemorrhoids. colonic diverticulosis. Incomplete examination. Prepartation inadequate.  ? COLONOSCOPY WITH PROPOFOL N/A 07/04/2015  ? RMR: Colonic diverticulosis . Large polypoid lesion in the vicinity of the hepatic flexure status post saline-assisted piecmeal snare polypectomy  with ablation and tattooing as described. Sigmoid polyp removed as described above. sigmoid colon polyp hyperplastic, hepatic flexure polyp with TA with focal high grade dysplasia   ? COLONOSCOPY WITH  PROPOFOL N/A 11/03/2018  ? Procedure: COLONOSCOPY WITH PROPOFOL;  Surgeon: Jonathon Bellows, MD;  Location: [facility] ENDOSCOPY;  Service: Gastroenterology;  Laterality: N/A;  ? COLONOSCOPY WITH PROPOFOL N/A 12/09/2020  ? Procedure: COLONOSCOPY WITH PROPOFOL;  Surgeon: Jonathon Bellows, MD;  Location: [facility] ENDOSCOPY;  Service: Gastroenterology;  Laterality: N/A;  COVID POSITIVE 10/30/2020  ? COLONOSCOPY WITH PROPOFOL N/A 01/20/2021  ? Procedure: COLONOSCOPY WITH PROPOFOL;  Surgeon: Jonathon Bellows, MD;  Location: [facility] ENDOSCOPY;  Service: Gastroenterology;  Laterality: N/A;  ? ENTEROSCOPY N/A 02/12/2020  ? Procedure: ENTEROSCOPY;  Surgeon: Jonathon Bellows, MD;  Location: [facility] ENDOSCOPY;  Service: Gastroenterology;  Laterality: N/A;  ? ESOPHAGEAL DILATION N/A 03/14/2015  ? Procedure: ESOPHAGEAL DILATION;  Surgeon: Daneil Dolin, MD;  Location: [facility];  Service: Endoscopy;  Laterality: N/AVenia Minks 56  ? ESOPHAGOGASTRODUODENOSCOPY N/A 05/19/2016  ? Procedure: ESOPHAGOGASTRODUODENOSCOPY (EGD);  Surgeon: Daneil Dolin, MD;  [facility];  Service: Endoscopy;  Laterality: N/A;  215  ? ESOPHAGOGASTRODUODENOSCOPY N/A 01/28/2017  ? Procedure: ESOPHAGOGASTRODUODENOSCOPY (EGD);  Surgeon: Daneil Dolin, MD;  [facility];  Service: Endoscopy;  Laterality: N/A;  ? ESOPHAGOGASTRODUODENOSCOPY (EGD) WITH PROPOFOL N/A 03/14/2015  ? RMR: Mild erosive reflux esophagitis status post passage o f a Maloney dilator. Abnormal gastric mucosa of uncertain significance as described above. status post biopsy, benign  ? ESOPHAGOGASTRODUODENOSCOPY (EGD) WITH PROPOFOL N/A 11/18/2017  ? Procedure: ESOPHAGOGASTRODUODENOSCOPY (EGD)  WITH PROPOFOL;  Surgeon: Daneil Dolin, MD;  [facility];  Service: Endoscopy;  Laterality: N/A;  12:15pm  ? ESOPHAGOGASTRODUODENOSCOPY (EGD) WITH PROPOFOL N/A 11/03/2018  ? Procedure: ESOPHAGOGASTRODUODENOSCOPY (EGD) WITH PROPOFOL;  Surgeon: Jonathon Bellows, MD;  Location: [facility] ENDOSCOPY;  Service: Gastroenterology;   Laterality: N/A;  ? GIVENS CAPSULE STUDY N/A 12/25/2019  ? Procedure: GIVENS CAPSULE STUDY;  Surgeon: Jonathon Bellows, MD;  Location: [facility]COPY;  Service: Gastroenterology;  Laterality: N/A;  ? MALONEY DILATION N/A 05/19/2016  ? Procedure: MALONEY DILATION;  Surgeon: Daneil Dolin, MD;  [facility];  Service: Endoscopy;  Laterality: N/A;  ? MALONEY DILATION N/A 01/28/2017  ? Procedure: MALONEY DILATION;  Surgeon: Daneil Dolin, MD;  [facility];  Service: Endoscopy;  Laterality: N/A;  ? MALONEY DILATION N/A 11/18/2017  ? Procedure: MALONEY DILATION;  Surgeon: Daneil Dolin, MD;  [facility];  Service: Endoscopy;  Laterality: N/A;  ? PARTIAL COLECTOMY  08/30/2015  ? polyp with adenocarcinoma  ? POLYPECTOMY N/A 07/04/2015  ? Procedure: POLYPECTOMY;  Surgeon: Daneil Dolin, MD;  Location: [facility];  Service: Endoscopy;  Laterality: N/A;  ? PORTACATH PLACEMENT Right 09/2014  ? TUBAL LIGATION    ? ? ?Prior to Admission medications   ?Medication Sig Start Date End Date Taking? Authorizing Provider  ?albuterol (PROVENTIL HFA;VENTOLIN HFA) 108 (90 Base) MCG/ACT inhaler Inhale 2 puffs into the lungs every 2 (two) hours as needed for wheezing or shortness of breath (cough). 10/17/15  Yes Annitta Needs, NP  ?albuterol (PROVENTIL) (2.5 MG/3ML) 0.083% nebulizer solution Take 3 mLs (2.5 mg total) by nebulization every 6 (six) hours as needed for wheezing or shortness of breath. 01/16/16  Yes Penland, Kelby Fam, MD  ?ALPRAZolam Duanne Moron) 0.25 MG tablet Take 0.125 mg by mouth daily as needed for anxiety.    Yes [provider]  ?atorvastatin (LIPITOR) 20 MG tablet Take 20 mg by mouth at bedtime. 07/30/21  Yes [provider]  ?escitalopram (LEXAPRO) 20 MG tablet Take 20 mg by mouth daily. 10/23/21  Yes [provider]  ?gabapentin (NEURONTIN) 300 MG capsule Take 300 mg by mouth 2 (two) times daily. 03/10/19  Yes [provider]  ?loratadine (CLARITIN) 10 MG tablet Take  10 mg by mouth daily as needed for allergies.    Yes [provider]  ?LUMIGAN 0.01 % SOLN Place 1 drop into the right eye at bedtime. 06/24/18  Yes [provider]  ?Naphazoline-Pheniramine (ALLERGY EYE OP) Place 1-2 drops into both eyes 2 (two) times daily as needed (ITCHY, WATERY EYES).   Yes [provider]  ?omeprazole (PRILOSEC) 40 MG capsule Take 1 capsule (40 mg total) by mouth daily. 09/11/20  Yes Jonathon Bellows, MD  ?ondansetron (ZOFRAN ODT) 4 MG disintegrating tablet Take 1 tablet (4 mg total) by mouth every 8 (eight) hours as needed. 04/09/21  Yes Lavonia Drafts, MD  ?ondansetron (ZOFRAN) 8 MG tablet Take 8 mg by mouth daily as needed (CAR SICKNESS).   Yes [provider]  ?PAZEO 0.7 % SOLN Place 1 drop into both eyes every morning. 06/27/18  Yes [provider]  ?Plecanatide (TRULANCE) 3 MG TABS Take 1 tablet by mouth daily. 11/20/21  Yes Jonathon Bellows, MD  ?[facility] 1-0.2 % SUSP Apply 1 drop to eye 2 (two) times daily. 04/04/19  Yes [provider]  ?sodium chloride (OCEAN) 0.65 % SOLN nasal spray Place 1 spray into both nostrils as needed for congestion.  Yes [provider]  ?traZODone (DESYREL) 50 MG tablet Take 50 mg by mouth at bedtime.   Yes [provider]  ? ? ?Allergies as of 11/20/2021 - Review Complete 11/20/2021  ?Allergen Reaction Noted  ? Codeine Rash 08/25/2011  ? ? ?Family History  ?Problem Relation Age of Onset  ? Other Mother   ?     clot that went to heart, deceased age 99ss  ? Heart attack Father   ?     age 37s, deceased  ? Stroke Father   ? Cancer Father   ?     "at death determined he was ate up with cancer"  ? Hypertension Sister   ? Kidney cancer Sister 28  ? Diabetes Brother   ? Hypertension Brother   ? Endometriosis Daughter   ? Heart attack Maternal Grandfather   ? Diabetes Sister   ? Brain cancer Paternal Aunt   ? Cancer Paternal Uncle   ?     NOS  ? Cancer Paternal Uncle   ?     NOS  ? Cancer Paternal Uncle   ?      NOS  ? Colon cancer Neg Hx   ? ? ?Social History  ? ?Socioeconomic History  ? Marital status: Married  ?  Spouse name: Not on file  ? Number of children: 2  ? Years of education: Not on file  ? Hi

## 2021-12-11 LAB — SURGICAL PATHOLOGY

## 2021-12-12 ENCOUNTER — Encounter: Payer: Self-pay | Admitting: Gastroenterology

## 2021-12-19 ENCOUNTER — Inpatient Hospital Stay: Payer: Medicare Other | Attending: Internal Medicine

## 2021-12-19 ENCOUNTER — Inpatient Hospital Stay: Payer: Medicare Other | Admitting: Medical Oncology

## 2021-12-25 ENCOUNTER — Other Ambulatory Visit: Payer: Self-pay

## 2021-12-25 DIAGNOSIS — Z122 Encounter for screening for malignant neoplasm of respiratory organs: Secondary | ICD-10-CM

## 2021-12-25 DIAGNOSIS — Z87891 Personal history of nicotine dependence: Secondary | ICD-10-CM

## 2021-12-25 DIAGNOSIS — F17210 Nicotine dependence, cigarettes, uncomplicated: Secondary | ICD-10-CM

## 2021-12-29 NOTE — Progress Notes (Signed)
Inform biopsies from the stomach showed intestinal metaplasia.  Recommend EGD with gastric mapping in 6 months.  Check for H. pylori off any PPI for a week at our office

## 2022-01-01 ENCOUNTER — Telehealth: Payer: Self-pay

## 2022-01-01 DIAGNOSIS — R6889 Other general symptoms and signs: Secondary | ICD-10-CM

## 2022-01-01 DIAGNOSIS — K31A0 Gastric intestinal metaplasia, unspecified: Secondary | ICD-10-CM

## 2022-01-01 NOTE — Telephone Encounter (Signed)
-----   Message from Jonathon Bellows, MD sent at 12/29/2021  2:32 PM EDT ----- ?Inform biopsies from the stomach showed intestinal metaplasia.  Recommend EGD with gastric mapping in 6 months.  Check for H. pylori off any PPI for a week at our office ?

## 2022-01-01 NOTE — Telephone Encounter (Signed)
Order lab work and called patient and patient verbalized understanding of results. Patient states she will stop taking the omeprazole and will come in for the labs . She wants [place] to call her when it gets closer to schedule her EGD. Put in a recall  ?

## 2022-01-13 NOTE — Telephone Encounter (Signed)
error 

## 2022-01-20 ENCOUNTER — Ambulatory Visit: Admission: RE | Admit: 2022-01-20 | Payer: Medicare Other | Source: Ambulatory Visit

## 2022-02-02 DIAGNOSIS — E785 Hyperlipidemia, unspecified: Secondary | ICD-10-CM | POA: Diagnosis not present

## 2022-02-02 DIAGNOSIS — G629 Polyneuropathy, unspecified: Secondary | ICD-10-CM | POA: Diagnosis not present

## 2022-02-02 DIAGNOSIS — Z87891 Personal history of nicotine dependence: Secondary | ICD-10-CM | POA: Diagnosis not present

## 2022-02-02 DIAGNOSIS — F514 Sleep terrors [night terrors]: Secondary | ICD-10-CM | POA: Diagnosis not present

## 2022-02-02 DIAGNOSIS — R7303 Prediabetes: Secondary | ICD-10-CM | POA: Diagnosis not present

## 2022-02-02 DIAGNOSIS — Z1231 Encounter for screening mammogram for malignant neoplasm of breast: Secondary | ICD-10-CM | POA: Diagnosis not present

## 2022-02-02 DIAGNOSIS — Z79899 Other long term (current) drug therapy: Secondary | ICD-10-CM | POA: Diagnosis not present

## 2022-02-02 DIAGNOSIS — Z6823 Body mass index (BMI) 23.0-23.9, adult: Secondary | ICD-10-CM | POA: Diagnosis not present

## 2022-02-02 DIAGNOSIS — J439 Emphysema, unspecified: Secondary | ICD-10-CM | POA: Diagnosis not present

## 2022-02-02 DIAGNOSIS — I700 Atherosclerosis of aorta: Secondary | ICD-10-CM | POA: Diagnosis not present

## 2022-02-19 ENCOUNTER — Ambulatory Visit
Admission: RE | Admit: 2022-02-19 | Discharge: 2022-02-19 | Disposition: A | Discharge status: Home / Self Care | Payer: Medicare Other | Source: Ambulatory Visit | Attending: Acute Care | Admitting: Acute Care

## 2022-02-19 DIAGNOSIS — F17210 Nicotine dependence, cigarettes, uncomplicated: Secondary | ICD-10-CM | POA: Diagnosis not present

## 2022-02-19 DIAGNOSIS — Z122 Encounter for screening for malignant neoplasm of respiratory organs: Secondary | ICD-10-CM | POA: Insufficient documentation

## 2022-02-19 DIAGNOSIS — Z87891 Personal history of nicotine dependence: Secondary | ICD-10-CM | POA: Insufficient documentation

## 2022-02-23 ENCOUNTER — Other Ambulatory Visit: Payer: Self-pay | Admitting: Acute Care

## 2022-02-23 ENCOUNTER — Ambulatory Visit: Payer: Medicare Other | Admitting: Gastroenterology

## 2022-02-23 DIAGNOSIS — Z87891 Personal history of nicotine dependence: Secondary | ICD-10-CM

## 2022-02-23 DIAGNOSIS — Z122 Encounter for screening for malignant neoplasm of respiratory organs: Secondary | ICD-10-CM

## 2022-02-23 DIAGNOSIS — F17210 Nicotine dependence, cigarettes, uncomplicated: Secondary | ICD-10-CM

## 2022-05-14 ENCOUNTER — Ambulatory Visit: Payer: Self-pay | Admitting: Dermatology

## 2022-06-08 ENCOUNTER — Other Ambulatory Visit: Payer: Medicare Other

## 2022-06-08 ENCOUNTER — Ambulatory Visit: Payer: Medicare Other | Admitting: Internal Medicine

## 2022-06-09 ENCOUNTER — Ambulatory Visit: Payer: Medicare Other | Admitting: Gastroenterology

## 2022-06-25 DIAGNOSIS — Z23 Encounter for immunization: Secondary | ICD-10-CM | POA: Diagnosis not present

## 2022-06-25 DIAGNOSIS — M7061 Trochanteric bursitis, right hip: Secondary | ICD-10-CM | POA: Diagnosis not present

## 2022-07-06 ENCOUNTER — Other Ambulatory Visit: Payer: Medicare Other

## 2022-07-06 ENCOUNTER — Ambulatory Visit: Payer: Medicare Other | Admitting: Medical Oncology

## 2022-07-13 ENCOUNTER — Inpatient Hospital Stay: Payer: Medicaid Other | Admitting: Medical Oncology

## 2022-07-13 ENCOUNTER — Inpatient Hospital Stay: Payer: Medicaid Other

## 2022-07-27 ENCOUNTER — Encounter: Payer: Self-pay | Admitting: Medical Oncology

## 2022-07-27 ENCOUNTER — Inpatient Hospital Stay (HOSPITAL_BASED_OUTPATIENT_CLINIC_OR_DEPARTMENT_OTHER): Payer: Medicare Other | Admitting: Medical Oncology

## 2022-07-27 ENCOUNTER — Inpatient Hospital Stay: Payer: Medicare Other | Attending: Internal Medicine

## 2022-07-27 VITALS — BP 128/78 | HR 61 | Temp 96.7°F | Ht 63.0 in | Wt 139.7 lb

## 2022-07-27 DIAGNOSIS — R918 Other nonspecific abnormal finding of lung field: Secondary | ICD-10-CM | POA: Insufficient documentation

## 2022-07-27 DIAGNOSIS — Z90710 Acquired absence of both cervix and uterus: Secondary | ICD-10-CM | POA: Insufficient documentation

## 2022-07-27 DIAGNOSIS — Z1509 Genetic susceptibility to other malignant neoplasm: Secondary | ICD-10-CM | POA: Diagnosis not present

## 2022-07-27 DIAGNOSIS — G620 Drug-induced polyneuropathy: Secondary | ICD-10-CM | POA: Diagnosis not present

## 2022-07-27 DIAGNOSIS — C541 Malignant neoplasm of endometrium: Secondary | ICD-10-CM

## 2022-07-27 DIAGNOSIS — M25562 Pain in left knee: Secondary | ICD-10-CM

## 2022-07-27 DIAGNOSIS — F17210 Nicotine dependence, cigarettes, uncomplicated: Secondary | ICD-10-CM | POA: Insufficient documentation

## 2022-07-27 DIAGNOSIS — Z8051 Family history of malignant neoplasm of kidney: Secondary | ICD-10-CM

## 2022-07-27 DIAGNOSIS — T451X5A Adverse effect of antineoplastic and immunosuppressive drugs, initial encounter: Secondary | ICD-10-CM | POA: Insufficient documentation

## 2022-07-27 DIAGNOSIS — Z808 Family history of malignant neoplasm of other organs or systems: Secondary | ICD-10-CM | POA: Diagnosis not present

## 2022-07-27 DIAGNOSIS — G629 Polyneuropathy, unspecified: Secondary | ICD-10-CM | POA: Diagnosis not present

## 2022-07-27 DIAGNOSIS — Z79899 Other long term (current) drug therapy: Secondary | ICD-10-CM | POA: Insufficient documentation

## 2022-07-27 DIAGNOSIS — G8929 Other chronic pain: Secondary | ICD-10-CM | POA: Diagnosis not present

## 2022-07-27 DIAGNOSIS — Z95828 Presence of other vascular implants and grafts: Secondary | ICD-10-CM

## 2022-07-27 LAB — CBC WITH DIFFERENTIAL/PLATELET
Abs Immature Granulocytes: 0.03 10*3/uL (ref 0.00–0.07)
Basophils Absolute: 0 10*3/uL (ref 0.0–0.1)
Basophils Relative: 1 %
Eosinophils Absolute: 0 10*3/uL (ref 0.0–0.5)
Eosinophils Relative: 1 %
HCT: 38.7 % (ref 36.0–46.0)
Hemoglobin: 13 g/dL (ref 12.0–15.0)
Immature Granulocytes: 1 %
Lymphocytes Relative: 37 %
Lymphs Abs: 2.4 10*3/uL (ref 0.7–4.0)
MCH: 30.8 pg (ref 26.0–34.0)
MCHC: 33.6 g/dL (ref 30.0–36.0)
MCV: 91.7 fL (ref 80.0–100.0)
Monocytes Absolute: 0.4 10*3/uL (ref 0.1–1.0)
Monocytes Relative: 7 %
Neutro Abs: 3.5 10*3/uL (ref 1.7–7.7)
Neutrophils Relative %: 53 %
Platelets: 256 10*3/uL (ref 150–400)
RBC: 4.22 MIL/uL (ref 3.87–5.11)
RDW: 13.3 % (ref 11.5–15.5)
WBC: 6.4 10*3/uL (ref 4.0–10.5)
nRBC: 0 % (ref 0.0–0.2)

## 2022-07-27 LAB — COMPREHENSIVE METABOLIC PANEL
ALT: 13 U/L (ref 0–44)
AST: 18 U/L (ref 15–41)
Albumin: 3.9 g/dL (ref 3.5–5.0)
Alkaline Phosphatase: 43 U/L (ref 38–126)
Anion gap: 7 (ref 5–15)
BUN: 10 mg/dL (ref 6–20)
CO2: 26 mmol/L (ref 22–32)
Calcium: 8.7 mg/dL — ABNORMAL LOW (ref 8.9–10.3)
Chloride: 104 mmol/L (ref 98–111)
Creatinine, Ser: 0.69 mg/dL (ref 0.44–1.00)
GFR, Estimated: >60 mL/min (ref >60)
Glucose, Bld: 105 mg/dL — ABNORMAL HIGH (ref 70–99)
Potassium: 3.5 mmol/L (ref 3.5–5.1)
Sodium: 137 mmol/L (ref 135–145)
Total Bilirubin: 0.3 mg/dL (ref 0.3–1.2)
Total Protein: 7.1 g/dL (ref 6.5–8.1)

## 2022-07-27 MED ORDER — SODIUM CHLORIDE 0.9% FLUSH
10.0000 mL | Freq: Once | INTRAVENOUS | Status: AC
  Administered 2022-07-27: 10 mL via INTRAVENOUS
  Filled 2022-07-27: qty 10

## 2022-07-27 MED ORDER — HEPARIN SOD (PORK) LOCK FLUSH 100 UNIT/ML IV SOLN
500.0000 [IU] | Freq: Once | INTRAVENOUS | Status: AC
  Administered 2022-07-27: 500 [IU] via INTRAVENOUS
  Filled 2022-07-27: qty 5
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
## 2022-07-28 LAB — CA 125: Cancer Antigen (CA) 125: 13.2 U/mL (ref 0.0–38.1)

## 2022-08-25 DIAGNOSIS — R6889 Other general symptoms and signs: Secondary | ICD-10-CM | POA: Diagnosis not present

## 2022-08-25 DIAGNOSIS — J069 Acute upper respiratory infection, unspecified: Secondary | ICD-10-CM | POA: Diagnosis not present

## 2022-08-25 DIAGNOSIS — Z6824 Body mass index (BMI) 24.0-24.9, adult: Secondary | ICD-10-CM | POA: Diagnosis not present

## 2022-08-25 DIAGNOSIS — R062 Wheezing: Secondary | ICD-10-CM | POA: Diagnosis not present

## 2022-09-21 DIAGNOSIS — Z7902 Long term (current) use of antithrombotics/antiplatelets: Secondary | ICD-10-CM | POA: Insufficient documentation

## 2022-09-21 DIAGNOSIS — Z9181 History of falling: Secondary | ICD-10-CM | POA: Insufficient documentation

## 2022-09-21 HISTORY — DX: Long term (current) use of antithrombotics/antiplatelets: Z79.02

## 2022-09-21 HISTORY — DX: History of falling: Z91.81

## 2022-09-24 DIAGNOSIS — J439 Emphysema, unspecified: Secondary | ICD-10-CM | POA: Diagnosis not present

## 2022-09-24 DIAGNOSIS — Z79899 Other long term (current) drug therapy: Secondary | ICD-10-CM | POA: Diagnosis not present

## 2022-09-24 DIAGNOSIS — I700 Atherosclerosis of aorta: Secondary | ICD-10-CM | POA: Diagnosis not present

## 2022-09-24 DIAGNOSIS — I2510 Atherosclerotic heart disease of native coronary artery without angina pectoris: Secondary | ICD-10-CM | POA: Diagnosis not present

## 2022-09-24 DIAGNOSIS — E785 Hyperlipidemia, unspecified: Secondary | ICD-10-CM | POA: Diagnosis not present

## 2022-09-24 DIAGNOSIS — G629 Polyneuropathy, unspecified: Secondary | ICD-10-CM | POA: Diagnosis not present

## 2022-09-24 DIAGNOSIS — R233 Spontaneous ecchymoses: Secondary | ICD-10-CM | POA: Diagnosis not present

## 2022-09-24 DIAGNOSIS — E538 Deficiency of other specified B group vitamins: Secondary | ICD-10-CM | POA: Diagnosis not present

## 2022-09-24 DIAGNOSIS — R7303 Prediabetes: Secondary | ICD-10-CM | POA: Diagnosis not present

## 2022-09-24 DIAGNOSIS — R52 Pain, unspecified: Secondary | ICD-10-CM | POA: Diagnosis not present

## 2022-09-24 DIAGNOSIS — M79643 Pain in unspecified hand: Secondary | ICD-10-CM | POA: Diagnosis not present

## 2022-10-27 ENCOUNTER — Inpatient Hospital Stay: Payer: Medicaid Other

## 2022-11-02 DIAGNOSIS — H6993 Unspecified Eustachian tube disorder, bilateral: Secondary | ICD-10-CM | POA: Diagnosis not present

## 2022-11-02 DIAGNOSIS — R6889 Other general symptoms and signs: Secondary | ICD-10-CM | POA: Diagnosis not present

## 2022-11-02 DIAGNOSIS — J0140 Acute pansinusitis, unspecified: Secondary | ICD-10-CM | POA: Diagnosis not present

## 2022-11-26 ENCOUNTER — Inpatient Hospital Stay: Payer: Medicare HMO | Attending: Internal Medicine

## 2022-11-26 DIAGNOSIS — Z9049 Acquired absence of other specified parts of digestive tract: Secondary | ICD-10-CM | POA: Insufficient documentation

## 2022-11-26 DIAGNOSIS — F17210 Nicotine dependence, cigarettes, uncomplicated: Secondary | ICD-10-CM | POA: Insufficient documentation

## 2022-11-26 DIAGNOSIS — N952 Postmenopausal atrophic vaginitis: Secondary | ICD-10-CM | POA: Insufficient documentation

## 2022-11-26 DIAGNOSIS — Z1509 Genetic susceptibility to other malignant neoplasm: Secondary | ICD-10-CM | POA: Insufficient documentation

## 2022-11-26 DIAGNOSIS — Z90710 Acquired absence of both cervix and uterus: Secondary | ICD-10-CM | POA: Insufficient documentation

## 2022-11-26 DIAGNOSIS — F418 Other specified anxiety disorders: Secondary | ICD-10-CM | POA: Insufficient documentation

## 2022-11-26 DIAGNOSIS — Z808 Family history of malignant neoplasm of other organs or systems: Secondary | ICD-10-CM | POA: Insufficient documentation

## 2022-11-26 DIAGNOSIS — M25562 Pain in left knee: Secondary | ICD-10-CM | POA: Insufficient documentation

## 2022-11-26 DIAGNOSIS — M7989 Other specified soft tissue disorders: Secondary | ICD-10-CM | POA: Insufficient documentation

## 2022-11-26 DIAGNOSIS — Z8051 Family history of malignant neoplasm of kidney: Secondary | ICD-10-CM | POA: Insufficient documentation

## 2022-11-26 DIAGNOSIS — R10813 Right lower quadrant abdominal tenderness: Secondary | ICD-10-CM | POA: Insufficient documentation

## 2022-11-26 DIAGNOSIS — K5900 Constipation, unspecified: Secondary | ICD-10-CM | POA: Insufficient documentation

## 2022-11-26 DIAGNOSIS — C541 Malignant neoplasm of endometrium: Secondary | ICD-10-CM | POA: Insufficient documentation

## 2022-12-02 ENCOUNTER — Telehealth: Payer: Self-pay | Admitting: *Deleted

## 2022-12-02 ENCOUNTER — Telehealth: Payer: Self-pay | Admitting: Obstetrics and Gynecology

## 2022-12-02 NOTE — Telephone Encounter (Signed)
Per Bethann Humble, patient called back and she informed patient of her appts and let her know that she needs to contact her PCP for her other issues

## 2022-12-02 NOTE — Telephone Encounter (Signed)
Patient was transferred to Triage from scheduling line where patient was attempting to schedule some appointments . Patient was reporting that she has vaginal discharge, burring with urination, foul odor dark urine and a lump on her left leg. Upon further questioning, she reports that she has been seeing her PCP for sinus infection and the lump on her l leg  at the inner aspect of her knee that is getting larger and her PCP called it a [place] and told her that it may need to be drained. She Also attribute her vaginal discharge to being on  antibiotics last week for a sinus infection that her PCP was treating along with the foul odor and burning with urination thinking she has a yeast infection. She states it is so bad that she cannot wear undergarments.She also mentioned that she has not had a PAP smear in a very long time. Please advise if we need to do anything or if she needs to get back in touch with her PCP

## 2022-12-02 NOTE — Telephone Encounter (Signed)
Attempted to call patient back and there was no answer, it just kept ringing til it gave me a busy signal

## 2022-12-03 ENCOUNTER — Encounter: Payer: Self-pay | Admitting: Nurse Practitioner

## 2022-12-03 ENCOUNTER — Other Ambulatory Visit: Payer: Self-pay

## 2022-12-03 ENCOUNTER — Other Ambulatory Visit: Payer: Self-pay | Admitting: *Deleted

## 2022-12-03 ENCOUNTER — Inpatient Hospital Stay (HOSPITAL_BASED_OUTPATIENT_CLINIC_OR_DEPARTMENT_OTHER): Payer: Medicare HMO | Admitting: Nurse Practitioner

## 2022-12-03 ENCOUNTER — Inpatient Hospital Stay: Payer: Medicare HMO

## 2022-12-03 VITALS — BP 148/72 | HR 57 | Temp 97.2°F | Wt 131.9 lb

## 2022-12-03 DIAGNOSIS — Z95828 Presence of other vascular implants and grafts: Secondary | ICD-10-CM

## 2022-12-03 DIAGNOSIS — Z808 Family history of malignant neoplasm of other organs or systems: Secondary | ICD-10-CM | POA: Diagnosis not present

## 2022-12-03 DIAGNOSIS — Z1509 Genetic susceptibility to other malignant neoplasm: Secondary | ICD-10-CM | POA: Diagnosis not present

## 2022-12-03 DIAGNOSIS — M7989 Other specified soft tissue disorders: Secondary | ICD-10-CM | POA: Diagnosis not present

## 2022-12-03 DIAGNOSIS — Z8051 Family history of malignant neoplasm of kidney: Secondary | ICD-10-CM | POA: Diagnosis not present

## 2022-12-03 DIAGNOSIS — R300 Dysuria: Secondary | ICD-10-CM

## 2022-12-03 DIAGNOSIS — C541 Malignant neoplasm of endometrium: Secondary | ICD-10-CM

## 2022-12-03 DIAGNOSIS — Z90710 Acquired absence of both cervix and uterus: Secondary | ICD-10-CM | POA: Diagnosis not present

## 2022-12-03 DIAGNOSIS — F17210 Nicotine dependence, cigarettes, uncomplicated: Secondary | ICD-10-CM | POA: Diagnosis not present

## 2022-12-03 DIAGNOSIS — F418 Other specified anxiety disorders: Secondary | ICD-10-CM | POA: Diagnosis not present

## 2022-12-03 DIAGNOSIS — M7052 Other bursitis of knee, left knee: Secondary | ICD-10-CM | POA: Diagnosis not present

## 2022-12-03 DIAGNOSIS — N952 Postmenopausal atrophic vaginitis: Secondary | ICD-10-CM | POA: Diagnosis not present

## 2022-12-03 DIAGNOSIS — M25562 Pain in left knee: Secondary | ICD-10-CM | POA: Diagnosis not present

## 2022-12-03 DIAGNOSIS — Z604 Social exclusion and rejection: Secondary | ICD-10-CM

## 2022-12-03 DIAGNOSIS — K5900 Constipation, unspecified: Secondary | ICD-10-CM | POA: Diagnosis not present

## 2022-12-03 DIAGNOSIS — Z9049 Acquired absence of other specified parts of digestive tract: Secondary | ICD-10-CM | POA: Diagnosis not present

## 2022-12-03 DIAGNOSIS — F32A Depression, unspecified: Secondary | ICD-10-CM

## 2022-12-03 DIAGNOSIS — N898 Other specified noninflammatory disorders of vagina: Secondary | ICD-10-CM

## 2022-12-03 DIAGNOSIS — R10813 Right lower quadrant abdominal tenderness: Secondary | ICD-10-CM | POA: Diagnosis not present

## 2022-12-03 LAB — URINALYSIS, COMPLETE (UACMP) WITH MICROSCOPIC
Bilirubin Urine: NEGATIVE
Glucose, UA: NEGATIVE mg/dL
Hgb urine dipstick: NEGATIVE
Ketones, ur: NEGATIVE mg/dL
Leukocytes,Ua: NEGATIVE
Nitrite: NEGATIVE
Protein, ur: NEGATIVE mg/dL
Specific Gravity, Urine: 1.005 (ref 1.005–1.030)
pH: 6 (ref 5.0–8.0)

## 2022-12-03 LAB — CMP (CANCER CENTER ONLY)
ALT: 13 U/L (ref 0–44)
AST: 17 U/L (ref 15–41)
Albumin: 3.9 g/dL (ref 3.5–5.0)
Alkaline Phosphatase: 44 U/L (ref 38–126)
Anion gap: 5 (ref 5–15)
BUN: 16 mg/dL (ref 6–20)
CO2: 26 mmol/L (ref 22–32)
Calcium: 8.3 mg/dL — ABNORMAL LOW (ref 8.9–10.3)
Chloride: 103 mmol/L (ref 98–111)
Creatinine: 0.73 mg/dL (ref 0.44–1.00)
GFR, Estimated: >60 mL/min (ref >60)
Glucose, Bld: 125 mg/dL — ABNORMAL HIGH (ref 70–99)
Potassium: 3.3 mmol/L — ABNORMAL LOW (ref 3.5–5.1)
Sodium: 134 mmol/L — ABNORMAL LOW (ref 135–145)
Total Bilirubin: 0.4 mg/dL (ref 0.3–1.2)
Total Protein: 6.7 g/dL (ref 6.5–8.1)

## 2022-12-03 LAB — CBC WITH DIFFERENTIAL (CANCER CENTER ONLY)
Abs Immature Granulocytes: 0.02 10*3/uL (ref 0.00–0.07)
Basophils Absolute: 0 10*3/uL (ref 0.0–0.1)
Basophils Relative: 1 %
Eosinophils Absolute: 0.2 10*3/uL (ref 0.0–0.5)
Eosinophils Relative: 3 %
HCT: 35.1 % — ABNORMAL LOW (ref 36.0–46.0)
Hemoglobin: 11.7 g/dL — ABNORMAL LOW (ref 12.0–15.0)
Immature Granulocytes: 0 %
Lymphocytes Relative: 47 %
Lymphs Abs: 3.2 10*3/uL (ref 0.7–4.0)
MCH: 30.2 pg (ref 26.0–34.0)
MCHC: 33.3 g/dL (ref 30.0–36.0)
MCV: 90.5 fL (ref 80.0–100.0)
Monocytes Absolute: 0.5 10*3/uL (ref 0.1–1.0)
Monocytes Relative: 7 %
Neutro Abs: 2.8 10*3/uL (ref 1.7–7.7)
Neutrophils Relative %: 42 %
Platelet Count: 220 10*3/uL (ref 150–400)
RBC: 3.88 MIL/uL (ref 3.87–5.11)
RDW: 12.7 % (ref 11.5–15.5)
WBC Count: 6.7 10*3/uL (ref 4.0–10.5)
nRBC: 0 % (ref 0.0–0.2)

## 2022-12-03 MED ORDER — SODIUM CHLORIDE 0.9% FLUSH
10.0000 mL | Freq: Once | INTRAVENOUS | Status: AC
  Administered 2022-12-03: 10 mL via INTRAVENOUS
  Filled 2022-12-03: qty 10

## 2022-12-03 MED ORDER — HEPARIN SOD (PORK) LOCK FLUSH 100 UNIT/ML IV SOLN
500.0000 [IU] | Freq: Once | INTRAVENOUS | Status: AC
  Administered 2022-12-03: 500 [IU] via INTRAVENOUS
  Filled 2022-12-03: qty 5

## 2022-12-03 NOTE — Progress Notes (Signed)
[facility] [place] [facility] [place]. [facility] [address], [place], [place] [address] [phone] (phone) [phone] (fax)  Patient Care Team: Hamrick, Lorin Mercy, MD as PCP - General (Family Medicine) Whitney Muse, Kelby Fam, MD (Inactive) as PCP - Hematology/Oncology (Hematology and Oncology) Daneil Dolin, MD as Consulting Physician (Gastroenterology) Cammie Sickle, MD as Consulting Physician (Hematology and Oncology)   Name of the patient: Julia Osborne  [id number]  09/05/1961   Date of visit: 12/03/22  Diagnosis- Lynch Syndrome & Stage IV Endometrial Cancer  Chief complaint/ Reason for visit- vaginal discharge & irritation  Heme/Onc history:  Oncology History Overview Note  # OCT 2015- Uterine ca- STAGE IV [multiple lung nodules]; Nov 2015S/p radical hysterectomy, BSO, pelvic lymph node dissection/vaginal biopsies/extensive pelvic disease including positive lymph nodes and positive distal vaginal biopsy.]; [WFB]-endometroid G-2; LVI; positive Pelvic LN/vaginal Bx Jan-April 2016- carbo-Taxol x6 cycles Forestine Na; Dr.Penland]  # Oct 2016- colo- high grade dysplasia [Dr.Rourk; Dr.Jenkins s/p right hemi-colectomy]; last colo- may 2018;    # Genetic testing- [MSH6 mutation]/Lynch syndrome; EGD/colo every 2-3 qyears; urin cytology q6M  Histologic grade G2, histologic type endometrioid adenocarcinoma -----------------------------------------------------  DIAGNOSIS: Endometrioid endometrial cancer  STAGE: IV  ;GOALS: Control/palliative  CURRENT/MOST RECENT THERAPY surveillance    Endometrial cancer ([place])  07/12/2014 Imaging   CT C/A/P, pelvic adenopathy, multiple pulmonary nodules concerning for metastatic disease   07/30/2014 Initial Diagnosis   Endometrial cancer   07/31/2014 Pathology Results   endometrioid adenocarcinoma, G2, lymphovascular invasion present,  positive pelvic lymph node and vaginal biopsy   07/31/2014 Definitive Surgery   radical hysterectomy, BSO, pelvic lymph node dissection and vaginal biopsies   09/25/2014 Procedure   Port-A-Cath placement in IR   10/18/2014 - 01/10/2015 Chemotherapy   Carboplatin/Taxol. First cycle given at [facility].  S/P 6 cycles total, 5 cycles given at [facility].   07/04/2015 Procedure   Colonoscopy by Dr. Gala Romney   07/04/2015 Pathology Results   Colon, polyp(s), vicinity of hepatic flexure - TUBULAR ADENOMA WITH FOCAL HIGH GRADE DYSPLASIA.    Procedure   Partial colectomy by Dr. Arnoldo Morale scheduled for 08/30/2015   10/31/2015 Genetic Testing   [facility] SYNDROME MSH6 Mutation   04/27/2016 Imaging   CT CAP- No acute process or evidence of metastatic disease in the chest. Right middle lobe pulmonary nodule is unchanged back to 11/20/2014, favoring a benign etiology   11/10/2016 Imaging   CT CAP- 1. No evidence of metastatic disease in the chest, abdomen or pelvis. 2. No evidence of local tumor recurrence at the ileocolic anastomosis in the right abdomen or in the pelvis. Decreased mild fat stranding at the base of the right mesentery, most consistent with postsurgical scarring. 3. Aortic atherosclerosis.   Adenocarcinoma of colon ([place])  07/04/2015 Pathology Results   Diagnosis 1. Colon, polyp(s), vicinity of hepatic flexure - TUBULAR ADENOMA WITH FOCAL HIGH GRADE DYSPLASIA. - NO INVASIVE CARCINOMA. 2. Colon, polyp(s), sigmoid - HYPERPLASTIC POLYP. - NO DYSPLASIA OR MALIGNANCY.   07/04/2015 Procedure   Colonoscopoy by Dr. Gala Romney- large polypoid colonic lesion.   08/30/2015 Definitive Surgery   Dr. Arnoldo Morale- right segmental colon resection   08/30/2015 Pathology Results   [id number] intramucosal adenocarcinoma of colon, 0.2 cm inding the laminal propria with negative resection margins and 0/17 lymph nodes.     Interval history- Patient is 62 year old female with above history of stage IV endometrial cancer, currently  on surveillance, and  lynch syndrome, who presents to clinic for vaginal discharge and irritation. Symptoms have been present for past month. Noticed after she was treated for respiratory infection with amoxicillin. Has some white discharge on her underwear. Has lower abdominal fullness and RLQ tenderness. Has ongoing weight loss which is unintentional. She has noticed increased anger, depression, short temper, and anxiety since her cancer diagnosis, the sudden, unexpected loss of her granddaughter, and passing of her husband. She has been taking xanax frequently but now feels like it isn't controlling symptoms. Is interested in counseling. She has ongoing pain of the left, inner knee and has noticed swelling in this area. She's had falls because of the pain. Is anxious about missing her medical appointments and cancer screenings. She has chronic constipation, previously improved with amitiza but she has run out.   ECOG FS:1 - Symptomatic but completely ambulatory  Review of systems- Review of Systems  Constitutional:  Positive for malaise/fatigue and weight loss. Negative for chills and fever.  HENT:  Negative for hearing loss, nosebleeds, sore throat and tinnitus.   Eyes:  Negative for blurred vision and double vision.  Respiratory:  Negative for cough, hemoptysis, shortness of breath and wheezing.   Cardiovascular:  Negative for chest pain, palpitations and leg swelling.  Gastrointestinal:  Positive for abdominal pain and constipation. Negative for blood in stool, diarrhea, melena, nausea and vomiting.  Genitourinary:  Negative for dysuria, flank pain, frequency, hematuria and urgency.  Musculoskeletal:  Positive for falls and joint pain. Negative for back pain and myalgias.  Skin:  Negative for itching and rash.  Neurological:  Negative for dizziness, tingling, sensory change, loss of consciousness, weakness and headaches.  Endo/Heme/Allergies:  Negative for environmental allergies. Does not  bruise/bleed easily.  Psychiatric/Behavioral:  Positive for depression. The patient is nervous/anxious. The patient does not have insomnia.     Allergies  Allergen Reactions   Codeine Rash   Past Medical History:  Diagnosis Date   Adenocarcinoma of colon ([place]) 09/13/2015   Partial colon resection and chemo tx's.    Anemia    Anxiety    Asthma    Cancer ([place])    endometrial; cancer cells in intestine   COPD (chronic obstructive pulmonary disease) ([place])    no definite diagnosis   Depression    Diverticulitis    Dyspareunia 05/16/2015   Family history of cancer    Family history of kidney cancer    GERD (gastroesophageal reflux disease)    Glaucoma    Indigestion    Lynch syndrome    Neuropathy    feet and hands   Uterine fibroid    Vaginal dryness 05/16/2015   Vaginal itching 07/16/2015   Vaginal Pap smear, abnormal    Past Surgical History:  Procedure Laterality Date   ABDOMINAL HYSTERECTOMY     APPENDECTOMY     BIOPSY N/A 03/14/2015   Procedure: BIOPSY;  Surgeon: Daneil Dolin, MD;  Location: [facility];  Service: Endoscopy;  Laterality: N/A;  Gastric   COLONOSCOPY N/A 01/28/2017   Procedure: COLONOSCOPY;  Surgeon: Daneil Dolin, MD;  [facility];  Service: Endoscopy;  Laterality: N/A;  11:30am   COLONOSCOPY WITH PROPOFOL N/A 03/14/2015   RMR: Internal hemorrhoids. colonic diverticulosis. Incomplete examination. Prepartation inadequate.   COLONOSCOPY WITH PROPOFOL N/A 07/04/2015   RMR: Colonic diverticulosis . Large polypoid lesion in the vicinity of the hepatic flexure status post saline-assisted piecmeal snare polypectomy  with ablation and tattooing as described. Sigmoid polyp removed as described above.  sigmoid colon polyp hyperplastic, hepatic flexure polyp with TA with focal high grade dysplasia    COLONOSCOPY WITH PROPOFOL N/A 11/03/2018   Procedure: COLONOSCOPY WITH PROPOFOL;  Surgeon: Jonathon Bellows, MD;  Location: [facility] ENDOSCOPY;  Service: Gastroenterology;   Laterality: N/A;   COLONOSCOPY WITH PROPOFOL N/A 12/09/2020   Procedure: COLONOSCOPY WITH PROPOFOL;  Surgeon: Jonathon Bellows, MD;  Location: [facility] END[facility]COPY;  Service: Gastroenterology;  Laterality: N/A;  COVID POSITIVE 10/30/2020   COLONOSCOPY WITH PROPOFOL N/A 01/20/2021   Procedure: COLONOSCOPY WITH PROPOFOL;  Surgeon: Jonathon Bellows, MD;  Location[facility] ENDOSCOPY;  Service: Gastroenterology;  Laterality: N/A;   COLONOSCOPY WITH PROPOFOL N/A 12/10/2021   Procedure: COLONOSCOPY WITH PROPOFOL;  Surgeon: Jonathon Bellows, MD;  Location: [facility] ENDOSCOPY;  Service: Gastroenterology;  Laterality: N/A;   ENTEROSCOPY N/A 02/12/2020   Procedure: ENTEROSCOPY;  Surgeon: Jonathon Bellows, MD;  Location: [facility] ENDOSCOPY;  Service: Gastroenterology;  Laterality: N/A;   ESOPHAGEAL DILATION N/A 03/14/2015   Procedure: ESOPHAGEAL DILATION;  Surgeon: Daneil Dolin, MD;  Location: [facility];  Service: Endoscopy;  Laterality: N/AVenia Minks 1   ESOPHAGOGASTRODUODENOSCOPY N/A 05/19/2016   Procedure: ESOPHAGOGASTRODUODENOSCOPY (EGD);  Surgeon: Daneil Dolin, MD;  [facility];  Service: Endoscopy;  Laterality: N/A;  215   ESOPHAGOGASTRODUODENOSCOPY N/A 01/28/2017   Procedure: ESOPHAGOGASTRODUODENOSCOPY (EGD);  Surgeon: Daneil Dolin, MD;  [facility];  Service: Endoscopy;  Laterality: N/A;   ESOPHAGOGASTRODUODENOSCOPY N/A 12/10/2021   Procedure: ESOPHAGOGASTRODUODENOSCOPY (EGD);  Surgeon: Jonathon Bellows, MD;  Location: [facility] ENDOSCOPY;  Service: Gastroenterology;  Laterality: N/A;   ESOPHAGOGASTRODUODENOSCOPY (EGD) WITH PROPOFOL N/A 03/14/2015   RMR: Mild erosive reflux esophagitis status post passage o f a Maloney dilator. Abnormal gastric mucosa of uncertain significance as described above. status post biopsy, benign   ESOPHAGOGASTRODUODENOSCOPY (EGD) WITH PROPOFOL N/A 11/18/2017   Procedure: ESOPHAGOGASTRODUODENOSCOPY (EGD) WITH PROPOFOL;  Surgeon: Daneil Dolin, MD;  [facility];  Service: Endoscopy;   Laterality: N/A;  12:15pm   ESOPHAGOGASTRODUODENOSCOPY (EGD) WITH PROPOFOL N/A 11/03/2018   Procedure: ESOPHAGOGASTRODUODENOSCOPY (EGD) WITH PROPOFOL;  Surgeon: Jonathon Bellows, MD;  Location: [facility] ENDOSCOPY;  Service: Gastroenterology;  Laterality: N/A;   GIVENS CAPSULE STUDY N/A 12/25/2019   Procedure: GIVENS CAPSULE STUDY;  Surgeon: Jonathon Bellows, MD;  Location: [facility] ENDOSCOPY;  Service: Gastroenterology;  Laterality: N/A;   MALONEY DILATION N/A 05/19/2016   Procedure: [name] DILATION;  Surgeon: Daneil Dolin, MD;  [facility];  Service: Endoscopy;  Laterality: N/A;   MALONEY DILATION N/A 01/28/2017   Procedure: [name] DILATION;  Surgeon: Daneil Dolin, MD;  [facility];  Service: Endoscopy;  Laterality: N/A;   MALONEY DILATION N/A 11/18/2017   Procedure: [name] DILATION;  Surgeon: Daneil Dolin, MD;  [facility];  Service: Endoscopy;  Laterality: N/A;   PARTIAL COLECTOMY  08/30/2015   polyp with adenocarcinoma   POLYPECTOMY N/A 07/04/2015   Procedure: POLYPECTOMY;  Surgeon: Daneil Dolin, MD;  Location: [facility];  Service: Endoscopy;  Laterality: N/A;   PORTACATH PLACEMENT Right 09/2014   TUBAL LIGATION     Social History   Socioeconomic History   Marital status: Married    Spouse name: Not on file   Number of children: 2   Years of education: Not on file   Highest education level: Not on file  Occupational History   Not on file  Tobacco Use   Smoking status: Every Day    Packs/day: 2.00    Years: 25.00  Additional pack years: 0.00    Total pack years: 50.00    Types: Cigarettes   Smokeless tobacco: Never   Tobacco comments:    Is trying to quit/wean off.  Vaping Use   Vaping Use: Never used  Substance and Sexual Activity   Alcohol use: No   Drug use: No   Sexual activity: Not Currently    Birth control/protection: Surgical    Comment: hyst  Other Topics Concern   Not on file  Social History Narrative   #  [place]ives in [place];self;  smoking- 1/2 ppd; no alcohol; used to run machines;       FHx-Dad- MI; ? Cancer on autopsy; sisters/aunts- brain; lung cancer x2; oldest sister- kidney cancer      Pt has 83 year old son;daughter 35 years. Brothers- states to have spoken to her family re: importance of them being checked for lynch.    Social Determinants of Health   Financial Resource Strain: Not on file  Food Insecurity: Not on file  Transportation Needs: Not on file  Physical Activity: Not on file  Stress: Not on file  Social Connections: Not on file  Intimate Partner Violence: Not on file   Family History  Problem Relation Age of Onset   Other Mother        clot that went to heart, deceased age 42ss   Heart attack Father        age 70s, deceased   Stroke Father    Cancer Father        "at death determined he was ate up with cancer"   Hypertension Sister    Kidney cancer Sister 15   Diabetes Brother    Hypertension Brother    Endometriosis Daughter    Heart attack Maternal Grandfather    Diabetes Sister    Brain cancer Paternal Aunt    Cancer Paternal Uncle        NOS   Cancer Paternal Uncle        NOS   Cancer Paternal Uncle        NOS   Colon cancer Neg Hx     Current Outpatient Medications:    albuterol (PROVENTIL HFA;VENTOLIN HFA) 108 (90 Base) MCG/ACT inhaler, Inhale 2 puffs into the lungs every 2 (two) hours as needed for wheezing or shortness of breath (cough)., Disp: 1 Inhaler, Rfl: 3   albuterol (PROVENTIL) (2.5 MG/3ML) 0.083% nebulizer solution, Take 3 mLs (2.5 mg total) by nebulization every 6 (six) hours as needed for wheezing or shortness of breath., Disp: 75 mL, Rfl: 12   ALPRAZolam (XANAX) 0.25 MG tablet, Take 0.125 mg by mouth daily as needed for anxiety. , Disp: , Rfl:    atorvastatin (LIPITOR) 20 MG tablet, Take 20 mg by mouth at bedtime., Disp: , Rfl:    escitalopram (LEXAPRO) 20 MG tablet, Take 20 mg by mouth daily., Disp: , Rfl:    gabapentin (NEURONTIN) 300 MG capsule, Take 300  mg by mouth 2 (two) times daily., Disp: , Rfl:    loratadine (CLARITIN) 10 MG tablet, Take 10 mg by mouth daily as needed for allergies. , Disp: , Rfl:    LUMIGAN 0.01 % SOLN, Place 1 drop into the right eye at bedtime., Disp: , Rfl:    Naphazoline-Pheniramine (ALLERGY EYE OP), Place 1-2 drops into both eyes 2 (two) times daily as needed (ITCHY, WATERY EYES)., Disp: , Rfl:    omeprazole (PRILOSEC) 40 MG capsule, Take 1 capsule (40 mg total) by mouth daily., Disp:  90 capsule, Rfl: 0   ondansetron (ZOFRAN ODT) 4 MG disintegrating tablet, Take 1 tablet (4 mg total) by mouth every 8 (eight) hours as needed., Disp: 20 tablet, Rfl: 0   ondansetron (ZOFRAN) 8 MG tablet, Take 8 mg by mouth daily as needed (CAR SICKNESS)., Disp: , Rfl:    PAZEO 0.7 % SOLN, Place 1 drop into both eyes every morning., Disp: , Rfl:    Plecanatide (TRULANCE) 3 MG TABS, Take 1 tablet by mouth daily., Disp: 30 tablet, Rfl: 6   SIMBRINZA 1-0.2 % SUSP, Apply 1 drop to eye 2 (two) times daily., Disp: , Rfl:    sodium chloride (OCEAN) 0.65 % SOLN nasal spray, Place 1 spray into both nostrils as needed for congestion., Disp: , Rfl:    traZODone (DESYREL) 50 MG tablet, Take 50 mg by mouth at bedtime., Disp: , Rfl:   Physical exam:  Vitals:   12/03/22 1406  BP: (!) 148/72  Pulse: (!) 57  Temp: (!) 97.2 F (36.2 C)  TempSrc: Tympanic  SpO2: 99%  Weight: 131 lb 14.4 oz (59.8 kg)   Physical Exam Constitutional:      Appearance: She is not ill-appearing.  Eyes:     General: No scleral icterus. Cardiovascular:     Rate and Rhythm: Normal rate and regular rhythm.  Pulmonary:     Effort: Pulmonary effort is normal.  Abdominal:     General: Bowel sounds are normal. There is no distension.     Palpations: Abdomen is soft. There is no mass.     Tenderness: There is abdominal tenderness (Mild right sided tenderness to palpation). There is no guarding.     Comments: Well healed surgical scars.   Musculoskeletal:        General:  No deformity or signs of injury.     Right lower leg: No edema.     Left lower leg: No edema.  Skin:    General: Skin is warm and dry.  Neurological:     Mental Status: She is alert and oriented to person, place, and time.  Psychiatric:        Mood and Affect: Mood normal.        Behavior: Behavior normal.   Pelvic: Exam Chaperoned by RN EGBUS: no lesions. Atrophic appearing. Vagina: no lesions, discharge, or bleeding. Atrophic appearing. BME: nontender, no masses. Smooth. Cervix, Uterus, Ovaries: surgically absent. RV: deferred.       Latest Ref Rng & Units 12/03/2022    1:23 PM  CMP  Glucose 70 - 99 mg/dL 125   BUN 6 - 20 mg/dL 16   Creatinine 0.44 - 1.00 mg/dL 0.73   Sodium 135 - 145 mmol/L 134   Potassium 3.5 - 5.1 mmol/L 3.3   Chloride 98 - 111 mmol/L 103   CO2 22 - 32 mmol/L 26   Calcium 8.9 - 10.3 mg/dL 8.3   Total Protein 6.5 - 8.1 g/dL 6.7   Total Bilirubin 0.3 - 1.2 mg/dL 0.4   Alkaline Phos 38 - 126 U/L 44   AST 15 - 41 U/L 17   ALT 0 - 44 U/L 13       Latest Ref Rng & Units 12/03/2022    1:26 PM  CBC  WBC 4.0 - 10.5 K/uL 6.7   Hemoglobin 12.0 - 15.0 g/dL 11.7   Hematocrit 36.0 - 46.0 % 35.1   Platelets 150 - 400 K/uL 220    No images are attached to the encounter.  No  results found.  Assessment and plan- Patient is a 62 y.o. female    Visit Diagnosis 1. Lynch syndrome   2. Endometrial cancer ([place])   3. Bursitis of left knee, unspecified bursa   4. Vaginal discharge    Endometrial cancer- endometrioid type, grade 2. Stage IV based on lung nodules at diagnosis in 2015. Last imaging was negative for recurrent disease. Will order CT C/A/P today. NED on pelvic exam. Can see gyn onc in 6 months for repeat pelvic exam.  Vaginal discharge & malodor- none evident on exam. UA negative for infection.  Atrophic vaginitis- unable to afford replens consistently. Can trial other vaginal moisturizers particularly those with hyaluronic acid. Can also try topical  coconut oil or shea butter. Avoid hormonal agents or alternatives.  Left knee pain- swelling and tenderness of medial aspect of left knee. Possible bursitis? Refer to ortho for evaluation and management.  Skin cancer screening- referral to dermatology to total body skin exam Adjustment disorder- mixed grief, depressive, anxiety, and anger symptoms. On celexa and xanax per pcp. Will refer to psychiatry to medication management and counseling.  Lynch Syndrome- History of high grade polyp; s/p colectomy. Last colonoscopy and EGD with Dr Vicente Males March 2023 were negative. She is due at the end of the month. I've recommended she reach out to GI for appointment. She will repeat on annual basis. Urine cytology pending for urothelial cancer screening. Mammogram overdue and ordered today. CA 125 pending. She will see Dr Vicente Males for ongoing management of her Lynch syndrome.  Constipation- improved with amitiza but has run out of medication. I've encouraged her to reach out to GI for follow up.  Port-a-cath: port flushed today.   DISPOSITION: Ref to dermatology for TBSE Ref to psychiatry for grief, anger, depression, anxiety Ref to ortho for left knee pain & swelling Mammogram screening Follow up with Dr Vicente Males for lynch screenings, EGD & Colonoscopy, constipation. CT C/A/P w/ contrast  Rtc in 3 months for port/lab (cbc, cmp, ca 125, urine cytology, Dr Rogue Bussing- la   Patient expressed understanding and was in agreement with this plan. She also understands that She can call clinic at any time with any questions, concerns, or complaints.   Thank you for allowing me to participate in the care of this patient.   Beckey Rutter, DNP, AGNP-C, [place] [facility] [place]  CC: Dr Rogue Bussing

## 2022-12-04 LAB — CA 125: Cancer Antigen (CA) 125: 11.2 U/mL (ref 0.0–38.1)

## 2022-12-05 LAB — URINE CULTURE: Culture: NO GROWTH

## 2022-12-06 ENCOUNTER — Emergency Department: Payer: Medicare HMO

## 2022-12-06 ENCOUNTER — Other Ambulatory Visit: Payer: Self-pay

## 2022-12-06 ENCOUNTER — Emergency Department
Admission: EM | Admit: 2022-12-06 | Discharge: 2022-12-06 | Disposition: A | Discharge status: Home / Self Care | Payer: Medicare HMO | Attending: Emergency Medicine | Admitting: Emergency Medicine

## 2022-12-06 DIAGNOSIS — Z85038 Personal history of other malignant neoplasm of large intestine: Secondary | ICD-10-CM | POA: Insufficient documentation

## 2022-12-06 DIAGNOSIS — I10 Essential (primary) hypertension: Secondary | ICD-10-CM | POA: Diagnosis not present

## 2022-12-06 DIAGNOSIS — R42 Dizziness and giddiness: Secondary | ICD-10-CM | POA: Insufficient documentation

## 2022-12-06 DIAGNOSIS — J45909 Unspecified asthma, uncomplicated: Secondary | ICD-10-CM | POA: Diagnosis not present

## 2022-12-06 DIAGNOSIS — J449 Chronic obstructive pulmonary disease, unspecified: Secondary | ICD-10-CM | POA: Diagnosis not present

## 2022-12-06 DIAGNOSIS — S3993XA Unspecified injury of pelvis, initial encounter: Secondary | ICD-10-CM | POA: Diagnosis not present

## 2022-12-06 DIAGNOSIS — S3992XA Unspecified injury of lower back, initial encounter: Secondary | ICD-10-CM | POA: Diagnosis not present

## 2022-12-06 DIAGNOSIS — R29818 Other symptoms and signs involving the nervous system: Secondary | ICD-10-CM | POA: Diagnosis not present

## 2022-12-06 LAB — BASIC METABOLIC PANEL
Anion gap: 6 (ref 5–15)
BUN: 16 mg/dL (ref 6–20)
CO2: 24 mmol/L (ref 22–32)
Calcium: 8.1 mg/dL — ABNORMAL LOW (ref 8.9–10.3)
Chloride: 106 mmol/L (ref 98–111)
Creatinine, Ser: 0.91 mg/dL (ref 0.44–1.00)
GFR, Estimated: >60 mL/min (ref >60)
Glucose, Bld: 93 mg/dL (ref 70–99)
Potassium: 3.6 mmol/L (ref 3.5–5.1)
Sodium: 136 mmol/L (ref 135–145)

## 2022-12-06 LAB — CBC
HCT: 34.7 % — ABNORMAL LOW (ref 36.0–46.0)
Hemoglobin: 11.4 g/dL — ABNORMAL LOW (ref 12.0–15.0)
MCH: 30.1 pg (ref 26.0–34.0)
MCHC: 32.9 g/dL (ref 30.0–36.0)
MCV: 91.6 fL (ref 80.0–100.0)
Platelets: 201 10*3/uL (ref 150–400)
RBC: 3.79 MIL/uL — ABNORMAL LOW (ref 3.87–5.11)
RDW: 12.5 % (ref 11.5–15.5)
WBC: 6.3 10*3/uL (ref 4.0–10.5)
nRBC: 0 % (ref 0.0–0.2)

## 2022-12-06 MED ORDER — MECLIZINE HCL 25 MG PO TABS
25.0000 mg | ORAL_TABLET | Freq: Once | ORAL | Status: AC
Start: 2022-12-06 — End: 2022-12-06
  Administered 2022-12-06: 25 mg via ORAL
  Filled 2022-12-06: qty 1

## 2022-12-06 MED ORDER — MECLIZINE HCL 25 MG PO TABS: 25.0000 mg | ORAL_TABLET | Freq: Three times a day (TID) | ORAL | 0 refills | Status: DC | PRN

## 2022-12-06 MED ORDER — FLUTICASONE PROPIONATE 50 MCG/ACT NA SUSP: 1.0000 | Freq: Every day | NASAL | 0 refills | Status: AC

## 2022-12-06 MED ORDER — IOHEXOL 350 MG/ML SOLN
75.0000 mL | Freq: Once | INTRAVENOUS | Status: AC | PRN
  Administered 2022-12-06: 75 mL via INTRAVENOUS

## 2022-12-06 MED ORDER — ACETAMINOPHEN 500 MG PO TABS
1000.0000 mg | ORAL_TABLET | Freq: Once | ORAL | Status: AC
  Administered 2022-12-06: 1000 mg via ORAL
  Filled 2022-12-06: qty 2

## 2022-12-06 NOTE — ED Triage Notes (Signed)
Pt reports is so dizzy she cannot stand up. Reports fell against washing machine this am. Pt reports dizziness for 2 days. Reports has been going on for a week but has gotten worse over the last 2 days. Reports saw her MD and was told she had fluid in her left ear. Pt was put on amoxicillin and is still taking it but still dizzy. Pt reports hx of vertigo and states this feels the same but worse. Denies CP, SOB, NVD.

## 2022-12-06 NOTE — ED Triage Notes (Signed)
Pt in via [facility] from home with c/o dizziness and multiple falls. No injuries just sore. Dizziness for 2 days and gets more dizzy when she stands. Has hx of vertigo. #18g to right AC, 673mls of fluid given.  182/60, 140/66 with standing. CBG 103, 98% RA, HR 66.

## 2022-12-06 NOTE — Discharge Instructions (Signed)
Use Flonase and meclizine as prescribed.  Call ENT doctor for follow-up appointment.  Thank you for choosing [place] for your health care today!  Please see your primary doctor this week for a follow up appointment.   Sometimes, in the early stages of certain disease courses it is difficult to detect in the emergency department evaluation -- so, it is important that you continue to monitor your symptoms and call your doctor right away or return to the emergency department if you develop any new or worsening symptoms.  Please go to the following website to schedule new (and existing) patient appointments:   [email or web address]  If you do not have a primary doctor try calling the following clinics to establish care:  If you have insurance:  [facility] [phone] [place] [address]   [facility]  [phone] [address] [place] [address]   If you do not have insurance:  [facility]  [phone] [address], [place] [address]   The following is another list of primary care offices in the area who are accepting new patients at this time.  Please reach out to one of them directly and let them know you would like to schedule an appointment to follow up on an Emergency Department visit, and/or to establish a new primary care provider (PCP).  There are likely other primary care clinics in the are who are accepting new patients, but this is an excellent place to start:  [place] physician: Dr Lavon Paganini [address] [place], [place] [address] [phone]  [facility] Lead Physician: Dr Steele Sizer [address], [place], [place] [address] [phone]  [place] Physician: Dr Park Liter [address] [place], [place] [address] [phone]  [facility] Lead Physician: Dr Dewaine Oats [place], [place], [place] [address] [phone]  [place] [facility] [place] Physician: Dr Halina Maidens [address] Bro[place], [place] [address] [phone]   It was my pleasure to care for you today.   Hoover Brunette Jacelyn Grip, MD

## 2022-12-06 NOTE — ED Provider Notes (Signed)
[facility] Provider Note    Event Date/Time   First MD Initiated Contact with Patient 12/06/22 1128     (approximate)   History   Dizziness   HPI  Julia Osborne is a 62 y.o. female   Past medical history of colon cancer no active therapy on surveillance currently, anxiety, anemia, asthma, COPD, Lynch syndrome who presents to the emergency department with dizziness.  Ongoing for the last 2 to 3 weeks unchanged.  Was diagnosed with a fluid in the left ear by PMD and finished antibiotic course without resolution of symptoms.  Feels congested in the sinuses.  No pain.  No fever or chills.   Due to her dizziness she has had frequent falls.  She denies head strike or loss of consciousness.  She does not take blood thinners.  She did fall on her buttocks and has lower back pain.  She has no other acute medical complaints.   External Medical Documents Reviewed: Nurse practitioner progress note dated 12/03/2022 where they addressed multiple issues including bursitis of the left knee, cancer history      Physical Exam   Triage Vital Signs: ED Triage Vitals [12/06/22 1058]  Enc Vitals Group     BP      Pulse      Resp      Temp      Temp src      SpO2      Weight 132 lb 4.4 oz (60 kg)     Height 5\' 3"  (1.6 m)     Head Circumference      Peak Flow      Pain Score 0     Pain Loc      Pain Edu?      Excl. in [place]?     Most recent vital signs: Vitals:   12/06/22 1400  BP: 139/68  Pulse: 62  Resp: 16  Temp: (!) 97.2 F (36.2 C)  SpO2: 99%    General: Awake, no distress.  CV:  Good peripheral perfusion.  Resp:  Normal effort.  Abd:  No distention.  Other:  Awake alert comfortable with normal vital signs.  There is no obvious bulging or effusion bilateral TMs and there are no skin lesions in the ear canal or face.  No dysarthria facial asymmetry motor or sensory deficits.  Upon standing she does feel like she is imbalanced and needs to sit down  on the stretcher right away.  No dysmetria finger-to-nose testing.   ED Results / Procedures / Treatments   Labs (all labs ordered are listed, but only abnormal results are displayed) Labs Reviewed  BASIC METABOLIC PANEL - Abnormal; Notable for the following components:      Result Value   Calcium 8.1 (*)    All other components within normal limits  CBC - Abnormal; Notable for the following components:   RBC 3.79 (*)    Hemoglobin 11.4 (*)    HCT 34.7 (*)    All other components within normal limits     I ordered and reviewed the above labs they are notable for H&H within normal limits and normal white blood cell count normal creatinine and blood glucose.  EKG  ED ECG REPORT I, Lucillie Garfinkel, the attending physician, personally viewed and interpreted this ECG.   Date: 12/06/2022  EKG Time: 1101  Rate: 55  Rhythm: sinus bradycardia  Axis: nl  Intervals:none  ST&T Change: No acute ischemic changes  RADIOLOGY I independently reviewed and interpreted CT of the head see no obvious bleeding or midline shift   PROCEDURES:  Critical Care performed: No  Procedures   MEDICATIONS ORDERED IN ED: Medications  meclizine (ANTIVERT) tablet 25 mg (25 mg Oral Given 12/06/22 1252)  acetaminophen (TYLENOL) tablet 1,000 mg (1,000 mg Oral Given 12/06/22 1252)  iohexol (OMNIPAQUE) 350 MG/ML injection 75 mL (75 mLs Intravenous Contrast Given 12/06/22 1231)     IMPRESSION / MDM / ASSESSMENT AND PLAN / ED COURSE  I reviewed the triage vital signs and the nursing notes.                                Patient's presentation is most consistent with acute presentation with potential threat to life or bodily function.  Differential diagnosis includes, but is not limited to, central vertigo due to intracranial bleeding or stroke, peripheral vertigo due to labyrinthitis, ear infection, dysrhythmia, electrolyte disturbance, dehydration   The patient is on the cardiac monitor to  evaluate for evidence of arrhythmia and/or significant heart rate changes.  MDM: This is a patient with persistent dizziness and imbalance on my testing with no other focal neurologic deficits.  Sinus congestion may be due to peripheral cause.  However cannot rule out posterior stroke so we will get CT angiogram as well as MRI which both were fortunately negative.  She has some symptomatic improvement with meclizine.    I considered hospitalization for admission or observation however given negative workup as above and subacute/chronic symptoms I think outpatient monitoring and further treatment evaluation most appropriate this time.  I gave her contact information for ENT follow-up.  I also gave her prescription for meclizine and Flonase.  She was given strict return precautions.        FINAL CLINICAL IMPRESSION(S) / ED DIAGNOSES   Final diagnoses:  Dizziness     Rx / DC Orders   ED Discharge Orders          Ordered    meclizine (ANTIVERT) 25 MG tablet  3 times daily PRN        12/06/22 1402    fluticasone (FLONASE) 50 MCG/ACT nasal spray  Daily        12/06/22 1402             Note:  This document was prepared using Dragon voice recognition software and may include unintentional dictation errors.    Lucillie Garfinkel, MD 12/06/22 [phone]

## 2022-12-07 ENCOUNTER — Inpatient Hospital Stay: Payer: Medicare HMO | Admitting: Licensed Clinical Social Worker

## 2022-12-07 DIAGNOSIS — C541 Malignant neoplasm of endometrium: Secondary | ICD-10-CM

## 2022-12-07 NOTE — Progress Notes (Signed)
[facility]  Initial Assessment   Julia Osborne is a 62 y.o. year old female contacted by phone. Clinical Social Work was referred by medical provider for assessment of psychosocial needs.   SDOH (Social Determinants of Health) assessments performed: Yes SDOH Interventions    Flowsheet Row Clinical Support from 12/07/2022 in [place] at [place] Interventions   Food Insecurity Interventions --  [Patient's SNAP benefit will stop in May 2024,due to increase in income from survivors benefits.]  Housing Interventions Intervention Not Indicated  Transportation Interventions Intervention Not Indicated  Utilities Interventions Intervention Not Indicated  Alcohol Usage Interventions Intervention Not Indicated (Score <7)  Depression Interventions/Treatment  Counseling  Financial Strain Interventions Intervention Not Indicated  Physical Activity Interventions Intervention Not Indicated  Stress Interventions Provide Counseling  Social Connections Interventions Intervention Not Indicated       SDOH Screenings   Food Insecurity: Food Insecurity Present (12/07/2022)  Housing: Low Risk  (12/07/2022)  Transportation Needs: No Transportation Needs (12/07/2022)  Utilities: Not At Risk (12/07/2022)  Alcohol Screen: Low Risk  (12/07/2022)  Depression (PHQ2-9): High Risk (12/07/2022)  Financial Resource Strain: Medium Risk (12/07/2022)  Physical Activity: Inactive (12/07/2022)  Social Connections: Socially Isolated (12/07/2022)  Stress: Stress Concern Present (12/07/2022)  Tobacco Use: High Risk (12/03/2022)     Distress Screen completed: No    03/09/2018    1:57 PM  ONCBCN DISTRESS SCREENING  Screening Type Initial Screening  Distress experienced in past week (1-10) 1      Family/Social Information:  Housing Arrangement: patient lives alonemain contact, Josefa Half (niece) [phone]  Family members/support persons in your life?  Neighbors Transportation concerns: no  Employment: Disabled  .  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 Religious or spiritual practice: Yes-Non-denominational Services Currently in place:  Medicaid, [employer], SNAP benefits (until May 2024)  Coping/ Adjustment to diagnosis: Patient understands treatment plan and what happens next? yes Concerns about diagnosis and/or treatment: How I will pay for the services I need and Quality of life Patient reported stressors: Finances and Food, depression and anxiety Hopes and/or priorities: receive food assistance Patient enjoys  N/A Current coping skills/ strengths: Average or above average intelligence , Capable of independent living , Communication skills , General fund of knowledge , Motivation for treatment/growth , Physical Health , Religious Affiliation , and Work skills     SUMMARY: Current SDOH Barriers:  Financial constraints related to fixed income, Limited social support, Limited access to food, [place] Concerns , Family and relationship dysfunction, and Social Isolation  Clinical Social Work Clinical Goal(s):  Patient will work with SW to address concerns related to anxiety and depression  Interventions: Discussed common feeling and emotions when being diagnosed with cancer, and the importance of support during treatment Informed patient of the support team roles and support services at [facility] Provided CSW contact information and encouraged patient to call with any questions or concerns Provided patient with information about CSW role inpatient care and other available resources. CSW will send referral for food pantry.   Follow Up Plan: CSW will see patient on 12/15/2022 @ 10:00AM Patient  verbalizes understanding of plan: Yes    Adelene Amas, LCSW   Patient is participating in a Managed Medicaid Plan:  Yes

## 2022-12-09 ENCOUNTER — Telehealth: Payer: Self-pay

## 2022-12-09 NOTE — Telephone Encounter (Signed)
     Patient  visit on 12/06/2022  at [facility] was for dizziness.  Have you been able to follow up with your primary care physician? Patient has upcoming appointment scheduled.  The patient was or was not able to obtain any needed medicine or equipment. Patient was able to obtain medications.  Are there diet recommendations that you are having difficulty following? No  Patient expresses understanding of discharge instructions and education provided has no other needs at this time. Yes   [place] Resource Care Guide   [email or web address]  ?? [id number]   Website: [email or web address]  [email or web address]

## 2022-12-10 ENCOUNTER — Ambulatory Visit: Admission: RE | Admit: 2022-12-10 | Payer: Medicare HMO | Source: Ambulatory Visit

## 2022-12-15 ENCOUNTER — Encounter: Payer: Self-pay | Admitting: Licensed Clinical Social Worker

## 2022-12-15 ENCOUNTER — Inpatient Hospital Stay: Payer: Medicare HMO | Admitting: Licensed Clinical Social Worker

## 2022-12-15 NOTE — Progress Notes (Signed)
[place] CSW Progress Note  Clinical Education officer, museum contacted patient by phone to confirm appointment.  Patient stated she did not feel well today and would like to reschedule appointment.  CSW offered patient a tele-health appointment with MyChart, but patient stated her vertigo was too strong. CSW stated I would contact patient next week to reschedule.  Patient verbalized understanding.    Adelene Amas, LCSW    Patient is participating in a Managed Medicaid Plan:  Yes

## 2022-12-22 ENCOUNTER — Ambulatory Visit: Admission: RE | Admit: 2022-12-22 | Payer: Medicare HMO | Source: Ambulatory Visit

## 2022-12-25 DIAGNOSIS — R0981 Nasal congestion: Secondary | ICD-10-CM | POA: Diagnosis not present

## 2022-12-25 DIAGNOSIS — R42 Dizziness and giddiness: Secondary | ICD-10-CM | POA: Diagnosis not present

## 2022-12-31 ENCOUNTER — Ambulatory Visit: Admission: RE | Admit: 2022-12-31 | Payer: Medicare HMO | Source: Ambulatory Visit

## 2023-01-04 ENCOUNTER — Emergency Department
Admission: EM | Admit: 2023-01-04 | Discharge: 2023-01-04 | Disposition: A | Discharge status: Home / Self Care | Payer: Medicare HMO | Attending: Emergency Medicine | Admitting: Emergency Medicine

## 2023-01-04 ENCOUNTER — Emergency Department: Payer: Medicare HMO

## 2023-01-04 ENCOUNTER — Other Ambulatory Visit: Payer: Self-pay

## 2023-01-04 DIAGNOSIS — K1230 Oral mucositis (ulcerative), unspecified: Secondary | ICD-10-CM | POA: Diagnosis not present

## 2023-01-04 DIAGNOSIS — Z8542 Personal history of malignant neoplasm of other parts of uterus: Secondary | ICD-10-CM | POA: Insufficient documentation

## 2023-01-04 DIAGNOSIS — M25562 Pain in left knee: Secondary | ICD-10-CM | POA: Diagnosis not present

## 2023-01-04 DIAGNOSIS — G8929 Other chronic pain: Secondary | ICD-10-CM

## 2023-01-04 DIAGNOSIS — J449 Chronic obstructive pulmonary disease, unspecified: Secondary | ICD-10-CM | POA: Diagnosis not present

## 2023-01-04 DIAGNOSIS — Z1152 Encounter for screening for COVID-19: Secondary | ICD-10-CM | POA: Diagnosis not present

## 2023-01-04 DIAGNOSIS — R059 Cough, unspecified: Secondary | ICD-10-CM | POA: Diagnosis not present

## 2023-01-04 DIAGNOSIS — R070 Pain in throat: Secondary | ICD-10-CM | POA: Diagnosis not present

## 2023-01-04 DIAGNOSIS — R42 Dizziness and giddiness: Secondary | ICD-10-CM | POA: Diagnosis not present

## 2023-01-04 DIAGNOSIS — R531 Weakness: Secondary | ICD-10-CM | POA: Diagnosis not present

## 2023-01-04 DIAGNOSIS — K121 Other forms of stomatitis: Secondary | ICD-10-CM

## 2023-01-04 DIAGNOSIS — K0889 Other specified disorders of teeth and supporting structures: Secondary | ICD-10-CM | POA: Diagnosis not present

## 2023-01-04 LAB — SARS CORONAVIRUS 2 BY RT PCR: SARS Coronavirus 2 by RT PCR: NEGATIVE

## 2023-01-04 LAB — GROUP A STREP BY PCR: Group A Strep by PCR: NOT DETECTED

## 2023-01-04 MED ORDER — LIDOCAINE VISCOUS HCL 2 % MT SOLN: 5.0000 mL | Freq: Three times a day (TID) | OROMUCOSAL | 0 refills | Status: DC | PRN

## 2023-01-04 MED ORDER — LIDOCAINE VISCOUS HCL 2 % MT SOLN
15.0000 mL | Freq: Once | OROMUCOSAL | Status: AC
  Administered 2023-01-04: 15 mL via OROMUCOSAL
  Filled 2023-01-04: qty 15

## 2023-01-04 MED ORDER — KETOROLAC TROMETHAMINE 15 MG/ML IJ SOLN
15.0000 mg | Freq: Once | INTRAMUSCULAR | Status: AC
  Administered 2023-01-04: 15 mg via INTRAMUSCULAR
  Filled 2023-01-04: qty 1

## 2023-01-04 NOTE — ED Notes (Signed)
Pt taken to lobby via WC by this RN as she states is is very hard for her to walk. As soon as pt gets out of wheelchair, she is immediately independently ambulatory without difficulty. Provider notified.

## 2023-01-04 NOTE — ED Triage Notes (Signed)
Pt comes with c/o dry cough, sore throat and sores inside of mouth. Pt also states some knee pain with hx of buritis.

## 2023-01-04 NOTE — ED Notes (Signed)
Ace wrap applied to L knee. Pt able to stand and walk independently. Pt taken to lobby in [facility].

## 2023-01-04 NOTE — Discharge Instructions (Signed)
You may use the Magic mouthwash to help with your pain over the next couple of days.  Please schedule an appointment with ENT if your symptoms persist.  Please schedule an appointment with your orthopedist for your left knee pain.  Please return for any new, worsening, or change in symptoms or other concerns.  It was a pleasure caring for you today.

## 2023-01-04 NOTE — ED Provider Notes (Signed)
[facility] Provider Note    Event Date/Time   First MD Initiated Contact with Patient 01/04/23 1133     (approximate)   History   Nasal Congestion and Knee Pain   HPI  Julia Osborne is a 62 y.o. female who presents today for evaluation of an ulcer  Patient Active Problem List   Diagnosis Date Noted   History of endometrial cancer 06/21/2019   Diverticulitis 06/21/2019   COPD (chronic obstructive pulmonary disease) 06/21/2019   Asthma 06/21/2019   Uterine fibroid 06/21/2019   Anxiety disorder, unspecified 06/21/2019   Anemia 06/21/2019   Abdominal pain 12/21/2016   Lynch syndrome 10/31/2015   Genetic testing 10/21/2015   Abdominal pain, left upper quadrant 10/17/2015   Family history of cancer    Family history of kidney cancer    Adenocarcinoma of colon 09/13/2015   Colon neoplasm 08/30/2015   Vaginal itching 07/16/2015   History of colonic polyps    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 drug 06/28/2015   Constipation 06/05/2015   Abnormal CT scan, sigmoid colon 06/05/2015   Vaginal dryness 05/16/2015   Dyspareunia 05/16/2015   Mucosal abnormality of stomach    Reflux esophagitis    Dysphagia    Hematochezia    Diverticulosis of colon without hemorrhage    Rectal bleeding 02/14/2015   GERD (gastroesophageal reflux disease) 02/14/2015   Esophageal dysphagia 02/14/2015   Abdominal pain, epigastric 02/14/2015   Nausea with vomiting 02/14/2015   Endometrial cancer 10/09/2014   Conversion reaction    Depression    Aphasia 09/11/2014          Physical Exam   Triage Vital Signs: ED Triage Vitals  Enc Vitals Group     BP 01/04/23 1135 (!) 170/66     Pulse Rate 01/04/23 1135 (!) 57     Resp 01/04/23 1135 18     Temp 01/04/23 1135 98 F (36.7 C)     Temp Source 01/04/23 1135 Oral     SpO2 01/04/23 1135 100 %     Weight 01/04/23 1135 129 lb (58.5 kg)     Height 01/04/23 1135 5\' 3"  (1.6 m)     Head Circumference --      Peak  Flow --      Pain Score 01/04/23 1122 5     Pain Loc --      Pain Edu? --      Excl. in [place]? --     Most recent vital signs: Vitals:   01/04/23 1135  BP: (!) 170/66  Pulse: (!) 57  Resp: 18  Temp: 98 F (36.7 C)  SpO2: 100%    Physical Exam Vitals and nursing note reviewed.  Constitutional:      General: Awake and alert. No acute distress.    Appearance: Normal appearance. The patient is normal weight.  HENT:     Head: Normocephalic and atraumatic.     Mouth: Mucous membranes are moist. Uvula midline.  No tonsillar exudate.  No soft palate fluctuance.  No trismus.  No voice change.  No sublingual swelling.  No tender cervical lymphadenopathy.  No nuchal rigidity Eyes:     General: PERRL. Normal EOMs        Right eye: No discharge.        Left eye: No discharge.     Conjunctiva/sclera: Conjunctivae normal.  Cardiovascular:     Rate and Rhythm: Normal rate and regular rhythm.     Pulses: Normal pulses.  Heart sounds: Normal heart sounds Pulmonary:     Effort: Pulmonary effort is normal. No respiratory distress.     Breath sounds: Normal breath sounds.  Abdominal:     Abdomen is soft. There is no abdominal tenderness. No rebound or guarding. No distention. Musculoskeletal:        General: No swelling. Normal range of motion.     Cervical back: Normal range of motion and neck supple.  Left knee: No deformity or rash. Medial joint line tenderness. No patellar tenderness, no ballotment Warm and well perfused extremity with 2+ pedal pulses 5/5 strength to dorsiflexion and plantarflexion at the ankle with intact sensation throughout extremity Normal range of motion of the knee, with intact flexion and extension to active and passive range of motion. Extensor mechanism intact. No ligamentous laxity. Negative anterior/posterior drawer/negative lachman, negative mcmurrays No effusion or warmth Intact quadriceps, hamstring function, patellar tendon function Pelvis stable Full  ROM of ankle without pain or swelling Foot warm and well perfused Skin:    General: Skin is warm and dry.     Capillary Refill: Capillary refill takes less than 2 seconds.     Findings: No rash.  Neurological:     Mental Status: The patient is awake and alert.      ED Results / Procedures / Treatments   Labs (all labs ordered are listed, but only abnormal results are displayed) Labs Reviewed  GROUP A STREP BY PCR  SARS CORONAVIRUS 2 BY RT PCR     EKG     RADIOLOGY ***    PROCEDURES:  Critical Care performed:   Procedures   MEDICATIONS ORDERED IN ED: Medications  lidocaine (XYLOCAINE) 2 % viscous mouth solution 15 mL (15 mLs Mouth/Throat Given 01/04/23 1216)  ketorolac (TORADOL) 15 MG/ML injection 15 mg (15 mg Intramuscular Given 01/04/23 1217)     IMPRESSION / MDM / ASSESSMENT AND PLAN / ED COURSE  I reviewed the triage vital signs and the nursing notes.   Differential diagnosis includes, but is not limited to,   Patient presents to the emergency department with knee pain. Considered differential diagnoses include effusion, sprain, contusion, dislocation, fracture, joint infection, tendon rupture. No evidence of neurological deficit or vascular compromise on exam. No fracture/dislocation on X-Ray. No deformity or obvious ligamentous laxity on exam.No constitutional symptoms or effusion to suggest septic joint. No history of immunosuppression. Overall well appearing, vital signs stable. No indication for diagnostic or therapeutic procedure such as arthrocentesis. Return precautions and care instructions discussed. Outpatient follow-up advised. Patient agrees with plan of care.    Patient's presentation is most consistent with {EM COPA:27473}   {If the patient is on the monitor, remove the brackets and asterisks on the sentence below and remember to document it as a Procedure as well. Otherwise delete the sentence below:1} {**The patient is on the cardiac  monitor to evaluate for evidence of arrhythmia and/or significant heart rate changes.**} {Remember to include, when applicable, any/all of the following data: independent review of imaging independent review of labs (comment specifically on pertinent positives and negatives) review of specific prior hospitalizations, PCP/specialist notes, etc. discuss meds given and prescribed document any discussion with consultants (including hospitalists) any clinical decision tools you used and why (PECARN, NEXUS, etc.) did you consider admitting the patient? document social determinants of health affecting patient's care (homelessness, inability to follow up in a timely fashion, etc) document any pre-existing conditions increasing risk on current visit (e.g. diabetes and HTN increasing danger of high-risk chest  pain/ACS) describes what meds you gave (especially parenteral) and why any other interventions?:1}     FINAL CLINICAL IMPRESSION(S) / ED DIAGNOSES   Final diagnoses:  None     Rx / DC Orders   ED Discharge Orders     None        Note:  This document was prepared using Dragon voice recognition software and may include unintentional dictation errors.

## 2023-01-06 ENCOUNTER — Ambulatory Visit: Admission: RE | Admit: 2023-01-06 | Payer: Medicare HMO | Source: Ambulatory Visit

## 2023-01-07 DIAGNOSIS — R6889 Other general symptoms and signs: Secondary | ICD-10-CM | POA: Diagnosis not present

## 2023-01-07 DIAGNOSIS — R1110 Vomiting, unspecified: Secondary | ICD-10-CM | POA: Diagnosis not present

## 2023-01-07 DIAGNOSIS — A084 Viral intestinal infection, unspecified: Secondary | ICD-10-CM | POA: Diagnosis not present

## 2023-01-07 DIAGNOSIS — R42 Dizziness and giddiness: Secondary | ICD-10-CM | POA: Diagnosis not present

## 2023-01-07 DIAGNOSIS — K1379 Other lesions of oral mucosa: Secondary | ICD-10-CM | POA: Diagnosis not present

## 2023-01-07 DIAGNOSIS — R197 Diarrhea, unspecified: Secondary | ICD-10-CM | POA: Diagnosis not present

## 2023-01-08 ENCOUNTER — Telehealth: Payer: Self-pay

## 2023-01-08 NOTE — Telephone Encounter (Signed)
     Patient  visit on 4/15  at [place]   Have you been able to follow up with your primary care physician? Yes   The patient was or was not able to obtain any needed medicine or equipment. Yes   Are there diet recommendations that you are having difficulty following? Na   Patient expresses understanding of discharge instructions and education provided has no other needs at this time.  Yes     Julia Osborne [facility] Care Guide, [place] [phone] [address], [place], [place] [address] Phone: [phone] Email: [email or web address]    

## 2023-01-13 DIAGNOSIS — T451X5A Adverse effect of antineoplastic and immunosuppressive drugs, initial encounter: Secondary | ICD-10-CM | POA: Diagnosis not present

## 2023-01-13 DIAGNOSIS — M25462 Effusion, left knee: Secondary | ICD-10-CM | POA: Diagnosis not present

## 2023-01-13 DIAGNOSIS — G620 Drug-induced polyneuropathy: Secondary | ICD-10-CM | POA: Diagnosis not present

## 2023-01-13 DIAGNOSIS — R6889 Other general symptoms and signs: Secondary | ICD-10-CM | POA: Diagnosis not present

## 2023-01-13 DIAGNOSIS — M67462 Ganglion, left knee: Secondary | ICD-10-CM | POA: Diagnosis not present

## 2023-01-13 DIAGNOSIS — G8929 Other chronic pain: Secondary | ICD-10-CM | POA: Diagnosis not present

## 2023-01-13 DIAGNOSIS — M1712 Unilateral primary osteoarthritis, left knee: Secondary | ICD-10-CM | POA: Diagnosis not present

## 2023-01-15 ENCOUNTER — Ambulatory Visit
Admission: RE | Admit: 2023-01-15 | Discharge: 2023-01-15 | Disposition: A | Discharge status: Home / Self Care | Payer: Medicare HMO | Source: Ambulatory Visit | Attending: Nurse Practitioner | Admitting: Nurse Practitioner

## 2023-01-15 DIAGNOSIS — C541 Malignant neoplasm of endometrium: Secondary | ICD-10-CM | POA: Insufficient documentation

## 2023-01-15 DIAGNOSIS — Z1509 Genetic susceptibility to other malignant neoplasm: Secondary | ICD-10-CM | POA: Diagnosis not present

## 2023-01-15 DIAGNOSIS — K8020 Calculus of gallbladder without cholecystitis without obstruction: Secondary | ICD-10-CM | POA: Diagnosis not present

## 2023-01-15 DIAGNOSIS — R6889 Other general symptoms and signs: Secondary | ICD-10-CM | POA: Diagnosis not present

## 2023-01-15 MED ORDER — IOHEXOL 300 MG/ML  SOLN
100.0000 mL | Freq: Once | INTRAMUSCULAR | Status: AC | PRN
  Administered 2023-01-15: 75 mL via INTRAVENOUS

## 2023-01-20 ENCOUNTER — Other Ambulatory Visit: Payer: Self-pay

## 2023-01-21 ENCOUNTER — Encounter: Payer: Self-pay | Admitting: Gastroenterology

## 2023-01-21 ENCOUNTER — Ambulatory Visit (INDEPENDENT_AMBULATORY_CARE_PROVIDER_SITE_OTHER): Payer: Medicare HMO | Admitting: Gastroenterology

## 2023-01-21 VITALS — BP 137/72 | HR 65 | Temp 98.2°F | Ht 63.0 in | Wt 126.2 lb

## 2023-01-21 DIAGNOSIS — Z1509 Genetic susceptibility to other malignant neoplasm: Secondary | ICD-10-CM | POA: Diagnosis not present

## 2023-01-21 DIAGNOSIS — K31A0 Gastric intestinal metaplasia, unspecified: Secondary | ICD-10-CM | POA: Diagnosis not present

## 2023-01-21 DIAGNOSIS — K5900 Constipation, unspecified: Secondary | ICD-10-CM | POA: Diagnosis not present

## 2023-01-21 DIAGNOSIS — R6889 Other general symptoms and signs: Secondary | ICD-10-CM

## 2023-01-21 MED ORDER — PEG 3350-KCL-NABCB-NACL-NASULF 236 G PO SOLR: 4000.0000 mL | Freq: Once | ORAL | 0 refills | Status: AC

## 2023-01-21 NOTE — Progress Notes (Signed)
Wyline Mood MD, MRCP(U.K) [address]  [address]  [place], [place] [address]  Main: [phone]  Fax: [phone]   Primary Care Physician: Ailene Ravel, MD  Primary Gastroenterologist:  Dr. Wyline Mood   Chief Complaint  Patient presents with   Dysphagia    HPI: Julia Osborne is a 62 y.o. female   Summary of history :   Initially  seen in February 2020 for dysphagia and Lynch syndrome.  She has a history of endometrial cancer, colon cancer, GERD.  She follows with Dr. Donneta Romberg  in oncology. She has been previously established with [facility] gastroenterology.  Carries a history of dysphagia, GERD and CA of her colon, status post right hemicolectomy.  History of stage IV uterine cancer.  Colon polyp resected in October 2016 showed an adenoma with focal high-grade dysplasia.  10/24/18: H pylori - negative 11/03/2018: EGD: Normal duodenal . Gastric bx shows mild chronic gastritis .  Colonoscopy : Hyperplastic rectal polyp. 12/25/2019: Capsule study of the small bowel showed a few tiny polyps in the proximal small bowel were noted.  Probably 2 to 3 mm in size.  A few lesions suggestive of small bowel AVMs were seen in the distal small bowel.  Nonbleeding.  02/12/2020: Push enteroscopy polyps in the first portion of the duodenum were noted 2 to 4 mm in size.  Resected and retrieved.  Biopsy report demonstrated peptic duodenitis  10/28/2020: CT scan of the abdomen and pelvis with contrast no evidence of recurring malignancy 12/09/2020: Colonoscopy: Large amount of stool seen in the rectum sigmoid colon interfering with visualization, repeat colonoscopy on 01/20/2021 8 mm polyp resected in the sigmoid colon otherwise examination was normal.  The polyp was a sessile serrated adenoma  11/28/2020: Urine analysis no RBCs seen    Interval history 11/20/2021-01/21/2023  12/10/2021: Colonoscopy showed no polyps.  EGD the showed some nodularity in the gastric antrum biopsies were taken was also  performed on the same day.  It showed intestinal metaplasia in 2-3 fragments.  No dysplasia.  She was recommended EGD with gastric mapping in 6 months 12/03/2022 urine analysis shows no abnormal cells. 01/19/2023 CT scan of the chest abdomen pelvis with contrast showed pulmonary nodules cholelithiasis otherwise no other abnormality.  Follows with oncology.  Has a history of unintentional weight loss which is being followed by oncology she has been referred to dermatology for skin cancer screening .   Last visit a year back it was felt that significant portion of her weight loss may be attributed to stress.  Over the past few months she states she has had difficulty swallowing solids goes down slowly.  No such issues in the past.  She is seeing an ENT physician for issues with recurrent sinusitis.  She continues to smoke.  Denies any vaginal bleeding.  She has lost over 14 pounds since last year.  Denies any constipation.  Not taking any medications for her bowel movements presently. Current Outpatient Medications  Medication Sig Dispense Refill   albuterol (PROVENTIL HFA;VENTOLIN HFA) 108 (90 Base) MCG/ACT inhaler Inhale 2 puffs into the lungs every 2 (two) hours as needed for wheezing or shortness of breath (cough). 1 Inhaler 3   albuterol (PROVENTIL) (2.5 MG/3ML) 0.083% nebulizer solution Take 3 mLs (2.5 mg total) by nebulization every 6 (six) hours as needed for wheezing or shortness of breath. 75 mL 12   ALPRAZolam (XANAX) 0.25 MG tablet Take 0.125 mg by mouth daily as needed for anxiety.  atorvastatin (LIPITOR) 20 MG tablet Take 20 mg by mouth at bedtime.     escitalopram (LEXAPRO) 20 MG tablet Take 20 mg by mouth daily.     ezetimibe (ZETIA) 10 MG tablet 10 mg.     fluticasone (FLONASE) 50 MCG/ACT nasal spray Place 1 spray into both nostrils daily. 1 g 0   gabapentin (NEURONTIN) 300 MG capsule Take 300 mg by mouth 2 (two) times daily.     loratadine (CLARITIN) 10 MG tablet Take 10 mg by  mouth daily as needed for allergies.      LUMIGAN 0.01 % SOLN Place 1 drop into the right eye at bedtime.     magic mouthwash (lidocaine, diphenhydrAMINE, alum & mag hydroxide) suspension Swish and spit 5 mLs 3 (three) times daily as needed for mouth pain. 25 mL 0   meclizine (ANTIVERT) 25 MG tablet Take 1 tablet (25 mg total) by mouth 3 (three) times daily as needed for dizziness. 30 tablet 0   montelukast (SINGULAIR) 10 MG tablet 10 mg.     Naphazoline-Pheniramine (ALLERGY EYE OP) Place 1-2 drops into both eyes 2 (two) times daily as needed (ITCHY, WATERY EYES).     omeprazole (PRILOSEC) 40 MG capsule Take 1 capsule (40 mg total) by mouth daily. 90 capsule 0   ondansetron (ZOFRAN ODT) 4 MG disintegrating tablet Take 1 tablet (4 mg total) by mouth every 8 (eight) hours as needed. 20 tablet 0   ondansetron (ZOFRAN) 8 MG tablet Take 8 mg by mouth daily as needed (CAR SICKNESS).     PAZEO 0.7 % SOLN Place 1 drop into both eyes every morning.     Plecanatide (TRULANCE) 3 MG TABS Take 1 tablet by mouth daily. 30 tablet 6   promethazine (PHENERGAN) 25 MG tablet Take 25 mg by mouth every 6 (six) hours as needed.     SIMBRINZA 1-0.2 % SUSP Apply 1 drop to eye 2 (two) times daily.     sodium chloride (OCEAN) 0.65 % SOLN nasal spray Place 1 spray into both nostrils as needed for congestion.     traZODone (DESYREL) 50 MG tablet Take 50 mg by mouth at bedtime.     No current facility-administered medications for this visit.    Allergies as of 01/21/2023 - Review Complete 01/21/2023  Allergen Reaction Noted   Codeine Rash 08/25/2011        ROS:  General: Negative for anorexia, weight loss, fever, chills, fatigue, weakness. ENT: Negative for hoarseness, difficulty swallowing , nasal congestion. CV: Negative for chest pain, angina, palpitations, dyspnea on exertion, peripheral edema.  Respiratory: Negative for dyspnea at rest, dyspnea on exertion, cough, sputum, wheezing.  GI: See history of  present illness. GU:  Negative for dysuria, hematuria, urinary incontinence, urinary frequency, nocturnal urination.  Endo: Negative for unusual weight change.    Physical Examination:   BP (!) 148/80   Pulse 72   Temp 98.2 F (36.8 C) (Oral)   Ht 5\' 3"  (1.6 m)   Wt 126 lb 4 oz (57.3 kg)   BMI 22.36 kg/m   General: Well-nourished, well-developed in no acute distress.  Eyes: No icterus. Conjunctivae pink. Mouth: Oropharyngeal mucosa moist and pink , no lesions erythema or exudate. Neuro: Alert and oriented x 3.  Grossly intact. Skin: Warm and dry, no jaundice.   Psych: Alert and cooperative, normal mood and affect.   Imaging Studies: CT CHEST ABDOMEN PELVIS W CONTRAST  Result Date: 01/19/2023 CLINICAL DATA:  Endometrial cancer restaging, Lynch syndrome, status  post total hysterectomy and partial colon resection * Tracking Code: BO * EXAM: CT CHEST, ABDOMEN, AND PELVIS WITH CONTRAST TECHNIQUE: Multidetector CT imaging of the chest, abdomen and pelvis was performed following the standard protocol during bolus administration of intravenous contrast. RADIATION DOSE REDUCTION: This exam was performed according to the departmental dose-optimization program which includes automated exposure control, adjustment of the mA and/or kV according to patient size and/or use of iterative reconstruction technique. CONTRAST:  75mL OMNIPAQUE IOHEXOL 300 MG/ML SOLN additional oral enteric contrast COMPARISON:  CT pelvis, 12/06/2022, CT chest, 02/19/2022, CT abdomen pelvis, 04/09/2021 FINDINGS: CT CHEST FINDINGS Cardiovascular: Right chest port catheter. Normal heart size. No pericardial effusion. Mediastinum/Nodes: No enlarged mediastinal, hilar, or axillary lymph nodes. Thyroid gland, trachea, and esophagus demonstrate no significant findings. Lungs/Pleura: Background of fine centrilobular pulmonary nodule Lhermitte, most concentrated in the lung apices. No pleural effusion or pneumothorax. Musculoskeletal: No  chest wall abnormality. No acute osseous findings. CT ABDOMEN PELVIS FINDINGS Hepatobiliary: No solid liver abnormality is seen. Contracted gallbladder containing small gallstones. No gallbladder wall thickening, or biliary dilatation. Pancreas: Unremarkable. No pancreatic ductal dilatation or surrounding inflammatory changes. Spleen: Normal in size without significant abnormality. Adrenals/Urinary Tract: Adrenal glands are unremarkable. Kidneys are normal, without renal calculi, solid lesion, or hydronephrosis. Bladder is unremarkable. Stomach/Bowel: Stomach is within normal limits. Status post partial right hemicolectomy and ileocolic anastomosis. No evidence of bowel wall thickening, distention, or inflammatory changes. Vascular/Lymphatic: Aortic atherosclerosis. No enlarged abdominal or pelvic lymph nodes. Reproductive: Status post hysterectomy. Other: No abdominal wall hernia or abnormality. No ascites. Musculoskeletal: No acute osseous findings. IMPRESSION: 1. Status post partial right hemicolectomy and ileocolic anastomosis as well as hysterectomy. 2. No evidence of lymphadenopathy or metastatic disease in the chest, abdomen, or pelvis. 3. Background of fine centrilobular pulmonary nodules, most concentrated in the lung apices, consistent with smoking-related respiratory bronchiolitis. 4. Cholelithiasis. Aortic Atherosclerosis (ICD10-I70.0). Electronically Signed   By: Jearld Lesch M.D.   On: 01/19/2023 13:56   DG Knee Complete 4 Views Left  Result Date: 01/04/2023 CLINICAL DATA:  Left knee pain. EXAM: LEFT KNEE - COMPLETE 4+ VIEW COMPARISON:  None Available. FINDINGS: No fracture or bone lesion. Minor marginal spurring from the medial compartment. No other degenerative changes. No joint effusion. Soft tissues are unremarkable. IMPRESSION: 1. No fracture or acute finding. 2. Minimal medial compartment degenerative changes. Electronically Signed   By: Amie Portland M.D.   On: 01/04/2023 12:25     Assessment and Plan:   ANNYE FORREY is a 62 y.o. y/o female  here to follow up for abdominal pain.  She has a history of Lynch syndrome.   History of colon cancer with right hemicolectomy, uterine cancer.  History of IBS constipation responded well to Trulance in the past but not requiring any presently.  She has had significant unintentional weight loss with no abnormalities detected presently except for pulmonary nodules on recent CT scan.     Plan  She requires annual skin exam , outpatient colonoscopy and EGD with gastric mapping.  For gastric intestinal metaplasia . Annual urine exam in March 2025 3.  Unintentional weight loss follows with oncology     I have discussed alternative options, risks & benefits,  which include, but are not limited to, bleeding, infection, perforation,respiratory complication & drug reaction.  The patient agrees with this plan & written consent will be obtained.     Dr Wyline Mood  MD,MRCP [facility]) Follow up in 1 year

## 2023-01-21 NOTE — Addendum Note (Signed)
Addended by: Tawnya Crook on: 01/21/2023 01:48 PM   Modules accepted: Orders

## 2023-01-22 NOTE — Addendum Note (Signed)
Addended by: Tawnya Crook on: 01/22/2023 09:12 AM   Modules accepted: Orders

## 2023-01-25 ENCOUNTER — Encounter: Payer: Self-pay | Admitting: Internal Medicine

## 2023-01-25 ENCOUNTER — Inpatient Hospital Stay (HOSPITAL_BASED_OUTPATIENT_CLINIC_OR_DEPARTMENT_OTHER): Payer: Medicare HMO | Admitting: Internal Medicine

## 2023-01-25 ENCOUNTER — Inpatient Hospital Stay: Payer: Medicare HMO | Attending: Internal Medicine

## 2023-01-25 VITALS — BP 128/76 | HR 64 | Temp 97.7°F | Resp 17 | Ht 63.0 in | Wt 124.6 lb

## 2023-01-25 DIAGNOSIS — Z1509 Genetic susceptibility to other malignant neoplasm: Secondary | ICD-10-CM | POA: Insufficient documentation

## 2023-01-25 DIAGNOSIS — G629 Polyneuropathy, unspecified: Secondary | ICD-10-CM | POA: Diagnosis not present

## 2023-01-25 DIAGNOSIS — Z808 Family history of malignant neoplasm of other organs or systems: Secondary | ICD-10-CM | POA: Insufficient documentation

## 2023-01-25 DIAGNOSIS — C541 Malignant neoplasm of endometrium: Secondary | ICD-10-CM | POA: Diagnosis not present

## 2023-01-25 DIAGNOSIS — Z8051 Family history of malignant neoplasm of kidney: Secondary | ICD-10-CM | POA: Insufficient documentation

## 2023-01-25 DIAGNOSIS — Z90710 Acquired absence of both cervix and uterus: Secondary | ICD-10-CM | POA: Diagnosis not present

## 2023-01-25 DIAGNOSIS — K5900 Constipation, unspecified: Secondary | ICD-10-CM | POA: Insufficient documentation

## 2023-01-25 DIAGNOSIS — Z85038 Personal history of other malignant neoplasm of large intestine: Secondary | ICD-10-CM | POA: Insufficient documentation

## 2023-01-25 DIAGNOSIS — F17210 Nicotine dependence, cigarettes, uncomplicated: Secondary | ICD-10-CM | POA: Diagnosis not present

## 2023-01-25 DIAGNOSIS — Z8542 Personal history of malignant neoplasm of other parts of uterus: Secondary | ICD-10-CM | POA: Diagnosis not present

## 2023-01-25 DIAGNOSIS — Z95828 Presence of other vascular implants and grafts: Secondary | ICD-10-CM

## 2023-01-25 LAB — CBC WITH DIFFERENTIAL (CANCER CENTER ONLY)
Abs Immature Granulocytes: 0.03 10*3/uL (ref 0.00–0.07)
Basophils Absolute: 0 10*3/uL (ref 0.0–0.1)
Basophils Relative: 0 %
Eosinophils Absolute: 0.1 10*3/uL (ref 0.0–0.5)
Eosinophils Relative: 1 %
HCT: 36.7 % (ref 36.0–46.0)
Hemoglobin: 12.4 g/dL (ref 12.0–15.0)
Immature Granulocytes: 0 %
Lymphocytes Relative: 28 %
Lymphs Abs: 2.3 10*3/uL (ref 0.7–4.0)
MCH: 30.1 pg (ref 26.0–34.0)
MCHC: 33.8 g/dL (ref 30.0–36.0)
MCV: 89.1 fL (ref 80.0–100.0)
Monocytes Absolute: 0.5 10*3/uL (ref 0.1–1.0)
Monocytes Relative: 6 %
Neutro Abs: 5.4 10*3/uL (ref 1.7–7.7)
Neutrophils Relative %: 65 %
Platelet Count: 216 10*3/uL (ref 150–400)
RBC: 4.12 MIL/uL (ref 3.87–5.11)
RDW: 12.7 % (ref 11.5–15.5)
WBC Count: 8.3 10*3/uL (ref 4.0–10.5)
nRBC: 0 % (ref 0.0–0.2)

## 2023-01-25 LAB — CMP (CANCER CENTER ONLY)
ALT: 10 U/L (ref 0–44)
AST: 14 U/L — ABNORMAL LOW (ref 15–41)
Albumin: 4.4 g/dL (ref 3.5–5.0)
Alkaline Phosphatase: 47 U/L (ref 38–126)
Anion gap: 10 (ref 5–15)
BUN: 9 mg/dL (ref 6–20)
CO2: 26 mmol/L (ref 22–32)
Calcium: 9 mg/dL (ref 8.9–10.3)
Chloride: 104 mmol/L (ref 98–111)
Creatinine: 0.75 mg/dL (ref 0.44–1.00)
GFR, Estimated: >60 mL/min (ref >60)
Glucose, Bld: 126 mg/dL — ABNORMAL HIGH (ref 70–99)
Potassium: 3.4 mmol/L — ABNORMAL LOW (ref 3.5–5.1)
Sodium: 140 mmol/L (ref 135–145)
Total Bilirubin: 0.3 mg/dL (ref 0.3–1.2)
Total Protein: 7.2 g/dL (ref 6.5–8.1)

## 2023-01-25 MED ORDER — SODIUM CHLORIDE 0.9% FLUSH
10.0000 mL | Freq: Once | INTRAVENOUS | Status: AC
  Administered 2023-01-25: 10 mL via INTRAVENOUS
  Filled 2023-01-25: qty 10

## 2023-01-25 MED ORDER — HEPARIN SOD (PORK) LOCK FLUSH 100 UNIT/ML IV SOLN
500.0000 [IU] | Freq: Once | INTRAVENOUS | Status: AC
  Administered 2023-01-25: 500 [IU] via INTRAVENOUS
  Filled 2023-01-25: qty 5

## 2023-01-25 NOTE — Assessment & Plan Note (Addendum)
#   Uterine Cancer endometrioid type grade 2; stage IV [lung nodules at presentation 2015/]-APRIL 30th, 2024- - CT scan abdomen pelvis/chest-negative for any obvious recurrence.   Stable. No clinical evidence of recurrence.   # Lynch syndrome Julia Osborne of high-grade polyp/status post colectomy: Last colonoscopy-May 2022; awaiting EGD/[facility] [Dr.Anna]- 28th, may 2024 repeat on annual basis;  EGD- May 2021--reviewed.stable;  Urine cytology pending. Mammogram May 2024- pending.   #Active smoker on Wellbutrin-discussed smoking cessation; DECLINES.   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   # Constipation- on amitizia-stable.   # port flush- q 3 M  DISPOSITION: # port flush every 3 months # follow up in 6 months/labs;port flush-- cbc/cmp/ca-125/urine cytology; Dr.B  # I reviewed the blood work- with the patient in detail; also reviewed the imaging independently [as summarized above]; and with the patient in detail.

## 2023-01-25 NOTE — Progress Notes (Signed)
Pt states appetite is up and down. MD took her off all anxiety medications. She does feel anxious. Thinks she is losing weight. Denies Nausea. Has fatigue. No pelvic pain. No vaginal discharge or bleeding. Has a ganglian cyst that was drained recently behind left knee.

## 2023-01-25 NOTE — Progress Notes (Signed)
[facility] CONSULT NOTE  Patient Care Team: Hamrick, Durward Fortes, MD as PCP - General (Family Medicine) Galen Manila, Novella Olive, MD (Inactive) as PCP - Hematology/Oncology (Hematology and Oncology) Corbin Ade, MD as Consulting Physician (Gastroenterology) Earna Coder, MD as Consulting Physician (Hematology and Oncology)  CHIEF COMPLAINTS/PURPOSE OF CONSULTATION:  Endometrial cancer  #  Oncology History Overview Note  # OCT 2015- Uterine ca- STAGE IV [multiple lung nodules]; Nov 2015S/p radical hysterectomy, BSO, pelvic lymph node dissection/vaginal biopsies/extensive pelvic disease including positive lymph nodes and positive distal vaginal biopsy.]; [WFB]-endometroid G-2; LVI; positive Pelvic LN/vaginal Bx Jan-April 2016- carbo-Taxol x6 cycles [facility]; Dr.Penland]  # Oct 2016- colo- high grade dysplasia [Dr.Rourk; Dr.Jenkins s/p right hemi-colectomy]; last colo- may 2018;    # Genetic testing- [MSH6 mutation]/Lynch syndrome; EGD/colo every 2-3 qyears; urin cytology q6M  Histologic grade G2, histologic type endometrioid adenocarcinoma -----------------------------------------------------  DIAGNOSIS: Endometrioid endometrial cancer  STAGE: IV  ;GOALS: Control/palliative  CURRENT/MOST RECENT THERAPY surveillance    Endometrial cancer (HCC)  07/12/2014 Imaging   CT C/A/P, pelvic adenopathy, multiple pulmonary nodules concerning for metastatic disease   07/30/2014 Initial Diagnosis   Endometrial cancer   07/31/2014 Pathology Results   endometrioid adenocarcinoma, G2, lymphovascular invasion present, positive pelvic lymph node and vaginal biopsy   07/31/2014 Definitive Surgery   radical hysterectomy, BSO, pelvic lymph node dissection and vaginal biopsies   09/25/2014 Procedure   Port-A-Cath placement in IR   10/18/2014 - 01/10/2015 Chemotherapy   Carboplatin/Taxol. First cycle given at [facility].  S/P 6 cycles total, 5 cycles given at [facility].   07/04/2015  Procedure   Colonoscopy by Dr. Jena Gauss   07/04/2015 Pathology Results   Colon, polyp(s), vicinity of hepatic flexure - TUBULAR ADENOMA WITH FOCAL HIGH GRADE DYSPLASIA.    Procedure   Partial colectomy by Dr. Lovell Sheehan scheduled for 08/30/2015   10/31/2015 Genetic Testing   [facility] SYNDROME MSH6 Mutation   04/27/2016 Imaging   CT CAP- No acute process or evidence of metastatic disease in the chest. Right middle lobe pulmonary nodule is unchanged back to 11/20/2014, favoring a benign etiology   11/10/2016 Imaging   CT CAP- 1. No evidence of metastatic disease in the chest, abdomen or pelvis. 2. No evidence of local tumor recurrence at the ileocolic anastomosis in the right abdomen or in the pelvis. Decreased mild fat stranding at the base of the right mesentery, most consistent with postsurgical scarring. 3. Aortic atherosclerosis.   Adenocarcinoma of colon (HCC)  07/04/2015 Pathology Results   Diagnosis 1. Colon, polyp(s), vicinity of hepatic flexure - TUBULAR ADENOMA WITH FOCAL HIGH GRADE DYSPLASIA. - NO INVASIVE CARCINOMA. 2. Colon, polyp(s), sigmoid - HYPERPLASTIC POLYP. - NO DYSPLASIA OR MALIGNANCY.   07/04/2015 Procedure   Colonoscopoy by Dr. Jena Gauss- large polypoid colonic lesion.   08/30/2015 Definitive Surgery   Dr. Lovell Sheehan- right segmental colon resection   08/30/2015 Pathology Results   [id number] intramucosal adenocarcinoma of colon, 0.2 cm inding the laminal propria with negative resection margins and 0/17 lymph nodes.    HISTORY OF PRESENTING ILLNESS: Patient ambulating-independently.   Alone.   [name] 62 y.o.  female history of endometrial cancer stage IV/Lynch syndrome-current on surveillance is here for follow-up/a nd review the results- CT scan.  t states appetite is up and down. MD took her off all anxiety medications. She does feel anxious. Thinks she is losing weight. Awaiting endoscopies.   Denies Nausea. Has fatigue. No pelvic pain. No vaginal discharge or  bleeding.  Has a ganglian cyst that was drained recently behind left knee.   Patient denies any worsening shortness of breath or cough.  Appetite is good.  No weight loss.  Denies abdominal pain.  Unfortunately continues to smoke. No blood in stools or black or stools.  No blood in urine.  Review of Systems  Constitutional:  Positive for malaise/fatigue. Negative for chills, diaphoresis, fever and weight loss.  HENT:  Negative for sore throat.   Eyes:  Negative for double vision.  Respiratory:  Negative for cough, hemoptysis, sputum production, shortness of breath and wheezing.   Cardiovascular:  Negative for chest pain, palpitations, orthopnea and leg swelling.  Gastrointestinal:  Negative for blood in stool, constipation, diarrhea, heartburn, melena, nausea and vomiting.  Genitourinary:  Negative for dysuria, frequency and urgency.  Musculoskeletal:  Positive for back pain and joint pain.  Skin: Negative.  Negative for itching and rash.  Neurological:  Positive for tingling. Negative for dizziness, focal weakness, weakness and headaches.  Endo/Heme/Allergies:  Does not bruise/bleed easily.  Psychiatric/Behavioral:  Negative for depression. The patient is not nervous/anxious and does not have insomnia.      MEDICAL HISTORY:  Past Medical History:  Diagnosis Date   Adenocarcinoma of colon (HCC) 09/13/2015   Partial colon resection and chemo tx's.    Anemia    Anxiety    Asthma    Cancer (HCC)    endometrial; cancer cells in intestine   COPD (chronic obstructive pulmonary disease) (HCC)    no definite diagnosis   Depression    Diverticulitis    Dyspareunia 05/16/2015   Family history of cancer    Family history of kidney cancer    GERD (gastroesophageal reflux disease)    Glaucoma    Indigestion    Lynch syndrome    Neuropathy    feet and hands   Uterine fibroid    Vaginal dryness 05/16/2015   Vaginal itching 07/16/2015   Vaginal Pap smear, abnormal     SURGICAL  HISTORY: Past Surgical History:  Procedure Laterality Date   ABDOMINAL HYSTERECTOMY     APPENDECTOMY     BIOPSY N/A 03/14/2015   Procedure: BIOPSY;  Surgeon: Corbin Ade, MD;  Location: [facility];  Service: Endoscopy;  Laterality: N/A;  Gastric   COLONOSCOPY N/A 01/28/2017   Procedure: COLONOSCOPY;  Surgeon: Corbin Ade, MD;  Location: [facility];  Service: Endoscopy;  Laterality: N/A;  11:30am   COLONOSCOPY WITH PROPOFOL N/A 03/14/2015   RMR: Internal hemorrhoids. colonic diverticulosis. Incomplete examination. Prepartation inadequate.   COLONOSCOPY WITH PROPOFOL N/A 07/04/2015   RMR: Colonic diverticulosis . Large polypoid lesion in the vicinity of the hepatic flexure status post saline-assisted piecmeal snare polypectomy  with ablation and tattooing as described. Sigmoid polyp removed as described above. sigmoid colon polyp hyperplastic, hepatic flexure polyp with TA with focal high grade dysplasia    COLONOSCOPY WITH PROPOFOL N/A 11/03/2018   Procedure: COLONOSCOPY WITH PROPOFOL;  Surgeon: Wyline Mood, MD;  Location: [facility] ENDOSCOPY;  Service: Gastroenterology;  Laterality: N/A;   COLONOSCOPY WITH PROPOFOL N/A 12/09/2020   Procedure: COLONOSCOPY WITH PROPOFOL;  Surgeon: Wyline Mood, MD;  Location: [facility] ENDOSCOPY;  Service: Gastroenterology;  Laterality: N/A;  COVID POSITIVE 10/30/2020   COLONOSCOPY WITH PROPOFOL N/A 01/20/2021   Procedure: COLONOSCOPY WITH PROPOFOL;  Surgeon: Wyline Mood, MD;  Location: [facility] ENDOSCOPY;  Service: Gastroenterology;  Laterality: N/A;   COLONOSCOPY WITH PROPOFOL N/A 12/10/2021   Procedure: COLONOSCOPY WITH PROPOFOL;  Surgeon: Wyline Mood, MD;  Location:  [facility] ENDOSCOPY;  Service: Gastroenterology;  Laterality: N/A;   ENTEROSCOPY N/A 02/12/2020   Procedure: ENTEROSCOPY;  Surgeon: Wyline Mood, MD;  Location: [facility] ENDOSCOPY;  Service: Gastroenterology;  Laterality: N/A;   ESOPHAGEAL DILATION N/A 03/14/2015   Procedure: ESOPHAGEAL DILATION;  Surgeon: Corbin Ade,  MD;  Location: [facility];  Service: Endoscopy;  Laterality: N/AElease Hashimoto 54   ESOPHAGOGASTRODUODENOSCOPY N/A 05/19/2016   Procedure: ESOPHAGOGASTRODUODENOSCOPY (EGD);  Surgeon: Corbin Ade, MD;  Location: [facility];  Service: Endoscopy;  Laterality: N/A;  215   ESOPHAGOGASTRODUODENOSCOPY N/A 01/28/2017   Procedure: ESOPHAGOGASTRODUODENOSCOPY (EGD);  Surgeon: Corbin Ade, MD;  [facility]: [facility];  Service: Endoscopy;  Laterality: N/A;   ESOPHAGOGASTRODUODENOSCOPY N/A 12/10/2021   Procedure: ESOPHAGOGASTRODUODENOSCOPY (EGD);  Surgeon: Wyline Mood, MD;  Location: [facility] ENDOSCOPY;  Service: Gastroenterology;  Laterality: N/A;   ESOPHAGOGASTRODUODENOSCOPY (EGD) WITH PROPOFOL N/A 03/14/2015   RMR: Mild erosive reflux esophagitis status post passage o f a Maloney dilator. Abnormal gastric mucosa of uncertain significance as described above. status post biopsy, benign   ESOPHAGOGASTRODUODENOSCOPY (EGD) WITH PROPOFOL N/A 11/18/2017   Procedure: ESOPHAGOGASTRODUODENOSCOPY (EGD) WITH PROPOFOL;  Surgeon: Corbin Ade, MD;  [facility];  Service: Endoscopy;  Laterality: N/A;  12:15pm   ESOPHAGOGASTRODUODENOSCOPY (EGD) WITH PROPOFOL N/A 11/03/2018   Procedure: ESOPHAGOGASTRODUODENOSCOPY (EGD) WITH PROPOFOL;  Surgeon: Wyline Mood, MD;  Location: [facility] ENDOSCOPY;  Service: Gastroenterology;  Laterality: N/A;   GIVENS CAPSULE STUDY N/A 12/25/2019   Procedure: GIVENS CAPSULE STUDY;  Surgeon: Wyline Mood, MD;  Location: [facility] ENDOSCOPY;  Service: Gastroenterology;  Laterality: N/A;   MALONEY DILATION N/A 05/19/2016   Procedure: [name] DILATION;  Surgeon: Corbin Ade, MD;  Location: [facility];  Service: Endoscopy;  Laterality: N/A;   MALONEY DILATION N/A 01/28/2017   Procedure: [name] DILATION;  Surgeon: Corbin Ade, MD;  Location: [facility];  Service: Endoscopy;  Laterality: N/A;   MALONEY DILATION N/A 11/18/2017   Procedure: [name] DILATION;  Surgeon: Corbin Ade, MD;   Location: [facility];  Service: Endoscopy;  Laterality: N/A;   PARTIAL COLECTOMY  08/30/2015   polyp with adenocarcinoma   POLYPECTOMY N/A 07/04/2015   Procedure: POLYPECTOMY;  Surgeon: Corbin Ade, MD;  Location: [facility];  Service: Endoscopy;  Laterality: N/A;   PORTACATH PLACEMENT Right 09/2014   TUBAL LIGATION      SOCIAL HISTORY: Social History   Socioeconomic History   Marital status: Widowed    Spouse name: Not on file   Number of children: 2   Years of education: Not on file   Highest education level: Not on file  Occupational History   Not on file  Tobacco Use   Smoking status: Every Day    Packs/day: 2.00    Years: 25.00    Additional pack years: 0.00    Total pack years: 50.00    Types: Cigarettes   Smokeless tobacco: Never   Tobacco comments:    Is trying to quit/wean off.  Vaping Use   Vaping Use: Never used  Substance and Sexual Activity   Alcohol use: No   Drug use: No   Sexual activity: Not Currently    Birth control/protection: Surgical    Comment: hyst  Other Topics Concern   Not on file  Social History Narrative   #  [place]ives [place];self; smoking- 1/2 ppd; no alcohol; used to run machines;       FHx-Dad- MI; ? Cancer on autopsy; sisters/aunts- brain; lung cancer  x2; oldest sister- kidney cancer      Pt has 35 year old son;daughter 33 years. Brothers- states to have spoken to her family re: importance of them being checked for lynch.    Social Determinants of Health   Financial Resource Strain: Medium Risk (12/07/2022)   Overall Financial Resource Strain (CARDIA)    Difficulty of Paying Living Expenses: Somewhat hard  Food Insecurity: Food Insecurity Present (12/07/2022)   Hunger Vital Sign    Worried About Running Out of Food in the Last Year: Often true    Ran Out of Food in the Last Year: Often true  Transportation Needs: No Transportation Needs (12/07/2022)   PRAPARE - Administrator, Civil Service (Medical): No    Lack of  Transportation (Non-Medical): No  Physical Activity: Inactive (12/07/2022)   Exercise Vital Sign    Days of Exercise per Week: 0 days    Minutes of Exercise per Session: 0 min  Stress: Stress Concern Present (12/07/2022)   [employer] of Occupational Health - Occupational Stress Questionnaire    Feeling of Stress : To some extent  Social Connections: Socially Isolated (12/07/2022)   Social Connection and Isolation Panel [NHANES]    Frequency of Communication with Friends and Family: Once a week    Frequency of Social Gatherings with Friends and Family: Never    Attends Religious Services: Never    Database administrator or Organizations: No    Attends Banker Meetings: Never    Marital Status: Widowed  Intimate Partner Violence: Not At Risk (12/07/2022)   Humiliation, Afraid, Rape, and Kick questionnaire    Fear of Current or Ex-Partner: No    Emotionally Abused: No    Physically Abused: No    Sexually Abused: No    FAMILY HISTORY:  Family History  Problem Relation Age of Onset   Other Mother        clot that went to heart, deceased age 90ss   Heart attack Father        age 50s, deceased   Stroke Father    Cancer Father        "at death determined he was ate up with cancer"   Hypertension Sister    Kidney cancer Sister 33   Diabetes Brother    Hypertension Brother    Endometriosis Daughter    Heart attack Maternal Grandfather    Diabetes Sister    Brain cancer Paternal Aunt    Cancer Paternal Uncle        NOS   Cancer Paternal Uncle        NOS   Cancer Paternal Uncle        NOS   Colon cancer Neg Hx     ALLERGIES:  is allergic to codeine.  MEDICATIONS:  Current Outpatient Medications  Medication Sig Dispense Refill   albuterol (PROVENTIL HFA;VENTOLIN HFA) 108 (90 Base) MCG/ACT inhaler Inhale 2 puffs into the lungs every 2 (two) hours as needed for wheezing or shortness of breath (cough). 1 Inhaler 3   albuterol (PROVENTIL) (2.5 MG/3ML) 0.083%  nebulizer solution Take 3 mLs (2.5 mg total) by nebulization every 6 (six) hours as needed for wheezing or shortness of breath. 75 mL 12   escitalopram (LEXAPRO) 20 MG tablet Take 20 mg by mouth daily.     fluticasone (FLONASE) 50 MCG/ACT nasal spray Place 1 spray into both nostrils daily. 1 g 0   gabapentin (NEURONTIN) 300 MG capsule Take 300 mg by mouth 2 (  two) times daily.     loratadine (CLARITIN) 10 MG tablet Take 10 mg by mouth daily as needed for allergies.      LUMIGAN 0.01 % SOLN Place 1 drop into the right eye at bedtime.     meclizine (ANTIVERT) 25 MG tablet Take 1 tablet (25 mg total) by mouth 3 (three) times daily as needed for dizziness. 30 tablet 0   meloxicam (MOBIC) 7.5 MG tablet Take 7.5 mg by mouth daily.     montelukast (SINGULAIR) 10 MG tablet 10 mg.     Naphazoline-Pheniramine (ALLERGY EYE OP) Place 1-2 drops into both eyes 2 (two) times daily as needed (ITCHY, WATERY EYES).     omeprazole (PRILOSEC) 40 MG capsule Take 1 capsule (40 mg total) by mouth daily. 90 capsule 0   PAZEO 0.7 % SOLN Place 1 drop into both eyes every morning.     Plecanatide (TRULANCE) 3 MG TABS Take 1 tablet by mouth daily. 30 tablet 6   SIMBRINZA 1-0.2 % SUSP Apply 1 drop to eye 2 (two) times daily.     sodium chloride (OCEAN) 0.65 % SOLN nasal spray Place 1 spray into both nostrils as needed for congestion.     traZODone (DESYREL) 50 MG tablet Take 50 mg by mouth at bedtime.     ezetimibe (ZETIA) 10 MG tablet 10 mg. (Patient not taking: Reported on 01/25/2023)     ondansetron (ZOFRAN ODT) 4 MG disintegrating tablet Take 1 tablet (4 mg total) by mouth every 8 (eight) hours as needed. (Patient not taking: Reported on 01/25/2023) 20 tablet 0   ondansetron (ZOFRAN) 8 MG tablet Take 8 mg by mouth daily as needed (CAR SICKNESS). (Patient not taking: Reported on 01/25/2023)     promethazine (PHENERGAN) 25 MG tablet Take 25 mg by mouth every 6 (six) hours as needed. (Patient not taking: Reported on 01/25/2023)      No current facility-administered medications for this visit.      Marland Kitchen  PHYSICAL EXAMINATION: ECOG PERFORMANCE STATUS: 0 - Asymptomatic  Vitals:   01/25/23 1320  BP: 128/76  Pulse: 64  Resp: 17  Temp: 97.7 F (36.5 C)  SpO2: 100%   Filed Weights   01/25/23 1320  Weight: 124 lb 9.6 oz (56.5 kg)    Physical Exam Constitutional:      Comments: Patient is alone.  HENT:     Head: Normocephalic and atraumatic.     Mouth/Throat:     Pharynx: No oropharyngeal exudate.  Eyes:     Pupils: Pupils are equal, round, and reactive to light.  Cardiovascular:     Rate and Rhythm: Normal rate and regular rhythm.  Pulmonary:     Effort: No respiratory distress.     Breath sounds: No wheezing.  Abdominal:     General: Bowel sounds are normal. There is no distension.     Palpations: Abdomen is soft. There is no mass.     Tenderness: There is no abdominal tenderness. There is no guarding or rebound.  Musculoskeletal:        General: No tenderness. Normal range of motion.     Cervical back: Normal range of motion and neck supple.  Skin:    General: Skin is warm.     Comments:    Neurological:     Mental Status: She is alert and oriented to person, place, and time.  Psychiatric:        Mood and Affect: Affect normal.      LABORATORY DATA:  I have reviewed the data as listed Lab Results  Component Value Date   WBC 8.3 01/25/2023   HGB 12.4 01/25/2023   HCT 36.7 01/25/2023   MCV 89.1 01/25/2023   PLT 216 01/25/2023   Recent Labs    07/27/22 1042 12/03/22 1323 12/06/22 1100 01/25/23 1257  NA 137 134* 136 140  K 3.5 3.3* 3.6 3.4*  CL 104 103 106 104  CO2 26 26 24 26   GLUCOSE 105* 125* 93 126*  BUN 10 16 16 9   CREATININE 0.69 0.73 0.91 0.75  CALCIUM 8.7* 8.3* 8.1* 9.0  GFRNONAA >60 >60 >60 >60  PROT 7.1 6.7  --  7.2  ALBUMIN 3.9 3.9  --  4.4  AST 18 17  --  14*  ALT 13 13  --  10  ALKPHOS 43 44  --  47  BILITOT 0.3 0.4  --  0.3    RADIOGRAPHIC STUDIES: I  have personally reviewed the radiological images as listed and agreed with the findings in the report. CT CHEST ABDOMEN PELVIS W CONTRAST  Result Date: 01/19/2023 CLINICAL DATA:  Endometrial cancer restaging, Lynch syndrome, status post total hysterectomy and partial colon resection * Tracking Code: BO * EXAM: CT CHEST, ABDOMEN, AND PELVIS WITH CONTRAST TECHNIQUE: Multidetector CT imaging of the chest, abdomen and pelvis was performed following the standard protocol during bolus administration of intravenous contrast. RADIATION DOSE REDUCTION: This exam was performed according to the departmental dose-optimization program which includes automated exposure control, adjustment of the mA and/or kV according to patient size and/or use of iterative reconstruction technique. CONTRAST:  75mL OMNIPAQUE IOHEXOL 300 MG/ML SOLN additional oral enteric contrast COMPARISON:  CT pelvis, 12/06/2022, CT chest, 02/19/2022, CT abdomen pelvis, 04/09/2021 FINDINGS: CT CHEST FINDINGS Cardiovascular: Right chest port catheter. Normal heart size. No pericardial effusion. Mediastinum/Nodes: No enlarged mediastinal, hilar, or axillary lymph nodes. Thyroid gland, trachea, and esophagus demonstrate no significant findings. Lungs/Pleura: Background of fine centrilobular pulmonary nodule Lhermitte, most concentrated in the lung apices. No pleural effusion or pneumothorax. Musculoskeletal: No chest wall abnormality. No acute osseous findings. CT ABDOMEN PELVIS FINDINGS Hepatobiliary: No solid liver abnormality is seen. Contracted gallbladder containing small gallstones. No gallbladder wall thickening, or biliary dilatation. Pancreas: Unremarkable. No pancreatic ductal dilatation or surrounding inflammatory changes. Spleen: Normal in size without significant abnormality. Adrenals/Urinary Tract: Adrenal glands are unremarkable. Kidneys are normal, without renal calculi, solid lesion, or hydronephrosis. Bladder is unremarkable. Stomach/Bowel:  Stomach is within normal limits. Status post partial right hemicolectomy and ileocolic anastomosis. No evidence of bowel wall thickening, distention, or inflammatory changes. Vascular/Lymphatic: Aortic atherosclerosis. No enlarged abdominal or pelvic lymph nodes. Reproductive: Status post hysterectomy. Other: No abdominal wall hernia or abnormality. No ascites. Musculoskeletal: No acute osseous findings. IMPRESSION: 1. Status post partial right hemicolectomy and ileocolic anastomosis as well as hysterectomy. 2. No evidence of lymphadenopathy or metastatic disease in the chest, abdomen, or pelvis. 3. Background of fine centrilobular pulmonary nodules, most concentrated in the lung apices, consistent with smoking-related respiratory bronchiolitis. 4. Cholelithiasis. Aortic Atherosclerosis (ICD10-I70.0). Electronically Signed   By: Jearld Lesch M.D.   On: 01/19/2023 13:56   DG Knee Complete 4 Views Left  Result Date: 01/04/2023 CLINICAL DATA:  Left knee pain. EXAM: LEFT KNEE - COMPLETE 4+ VIEW COMPARISON:  None Available. FINDINGS: No fracture or bone lesion. Minor marginal spurring from the medial compartment. No other degenerative changes. No joint effusion. Soft tissues are unremarkable. IMPRESSION: 1. No fracture or acute finding. 2. Minimal medial compartment degenerative  changes. Electronically Signed   By: Amie Portland M.D.   On: 01/04/2023 12:25    ASSESSMENT & PLAN:   Endometrial cancer (HCC) # Uterine Cancer endometrioid type grade 2; stage IV [lung nodules at presentation 2015/]-APRIL 30th, 2024- - CT scan abdomen pelvis/chest-negative for any obvious recurrence.   Stable. No clinical evidence of recurrence.   # Lynch syndrome Aundra Dubin of high-grade polyp/status post colectomy: Last colonoscopy-May 2022; awaiting EGD/colo [Dr.Anna]- 28th, may 2024 repeat on annual basis;  EGD- May 2021--reviewed.stable;  Urine cytology pending. Mammogram May 2024- pending.   #Active smoker on  Wellbutrin-discussed smoking cessation; DECLINES.   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   # Constipation- on amitizia-stable.   # port flush- q 3 M  DISPOSITION: # port flush every 3 months # follow up in 6 months/labs;port flush-- cbc/cmp/ca-125/urine cytology; Dr.B  # I reviewed the blood work- with the patient in detail; also reviewed the imaging independently [as summarized above]; and with the patient in detail.     All questions were answered. The patient knows to call the clinic with any problems, questions or concerns.     Earna Coder, MD 01/25/2023 1:48 PM

## 2023-01-27 LAB — CA 125: Cancer Antigen (CA) 125: 12.6 U/mL (ref 0.0–38.1)

## 2023-01-29 DIAGNOSIS — R918 Other nonspecific abnormal finding of lung field: Secondary | ICD-10-CM | POA: Diagnosis not present

## 2023-01-29 DIAGNOSIS — F17210 Nicotine dependence, cigarettes, uncomplicated: Secondary | ICD-10-CM | POA: Diagnosis not present

## 2023-01-29 DIAGNOSIS — Z122 Encounter for screening for malignant neoplasm of respiratory organs: Secondary | ICD-10-CM | POA: Diagnosis not present

## 2023-02-08 ENCOUNTER — Other Ambulatory Visit: Payer: Self-pay | Admitting: Sports Medicine

## 2023-02-08 DIAGNOSIS — M25469 Effusion, unspecified knee: Secondary | ICD-10-CM

## 2023-02-08 DIAGNOSIS — M25562 Pain in left knee: Secondary | ICD-10-CM | POA: Diagnosis not present

## 2023-02-08 DIAGNOSIS — G8929 Other chronic pain: Secondary | ICD-10-CM

## 2023-02-08 DIAGNOSIS — M67462 Ganglion, left knee: Secondary | ICD-10-CM

## 2023-02-08 DIAGNOSIS — M1712 Unilateral primary osteoarthritis, left knee: Secondary | ICD-10-CM | POA: Diagnosis not present

## 2023-02-10 ENCOUNTER — Ambulatory Visit
Admission: RE | Admit: 2023-02-10 | Discharge: 2023-02-10 | Disposition: A | Discharge status: Home / Self Care | Payer: Medicare HMO | Source: Ambulatory Visit | Attending: Nurse Practitioner | Admitting: Nurse Practitioner

## 2023-02-10 DIAGNOSIS — C541 Malignant neoplasm of endometrium: Secondary | ICD-10-CM | POA: Diagnosis not present

## 2023-02-10 DIAGNOSIS — Z1509 Genetic susceptibility to other malignant neoplasm: Secondary | ICD-10-CM | POA: Diagnosis not present

## 2023-02-10 DIAGNOSIS — Z1231 Encounter for screening mammogram for malignant neoplasm of breast: Secondary | ICD-10-CM | POA: Insufficient documentation

## 2023-02-12 ENCOUNTER — Encounter: Payer: Self-pay | Admitting: Gastroenterology

## 2023-02-12 ENCOUNTER — Telehealth: Payer: Self-pay

## 2023-02-12 NOTE — Telephone Encounter (Signed)
-----   Message from Alinda Dooms, NP sent at 02/12/2023  1:32 PM EDT ----- Please let patient know that her mammogram was negative which is great news. Follow up as scheduled.  ----- Message ----- From: [name], Rad Results In Sent: 02/12/2023   9:14 AM EDT To: Alinda Dooms, NP

## 2023-02-12 NOTE — Telephone Encounter (Signed)
Informed patient, no concerns expressed

## 2023-02-16 ENCOUNTER — Ambulatory Visit: Payer: Medicare HMO | Admitting: Certified Registered"

## 2023-02-16 ENCOUNTER — Other Ambulatory Visit: Payer: Self-pay

## 2023-02-16 ENCOUNTER — Ambulatory Visit
Admission: RE | Admit: 2023-02-16 | Discharge: 2023-02-16 | Disposition: A | Discharge status: Home / Self Care | Payer: Medicare HMO | Attending: Gastroenterology | Admitting: Gastroenterology

## 2023-02-16 ENCOUNTER — Encounter: Payer: Self-pay | Admitting: Gastroenterology

## 2023-02-16 ENCOUNTER — Encounter
Admission: RE | Disposition: A | Discharge status: Home / Self Care | Payer: Self-pay | Source: Home / Self Care | Attending: Gastroenterology

## 2023-02-16 DIAGNOSIS — R1310 Dysphagia, unspecified: Secondary | ICD-10-CM | POA: Insufficient documentation

## 2023-02-16 DIAGNOSIS — R6889 Other general symptoms and signs: Secondary | ICD-10-CM

## 2023-02-16 DIAGNOSIS — Z1509 Genetic susceptibility to other malignant neoplasm: Secondary | ICD-10-CM | POA: Diagnosis not present

## 2023-02-16 DIAGNOSIS — C189 Malignant neoplasm of colon, unspecified: Secondary | ICD-10-CM | POA: Insufficient documentation

## 2023-02-16 DIAGNOSIS — Z5986 Financial insecurity: Secondary | ICD-10-CM | POA: Diagnosis not present

## 2023-02-16 DIAGNOSIS — K3189 Other diseases of stomach and duodenum: Secondary | ICD-10-CM | POA: Insufficient documentation

## 2023-02-16 DIAGNOSIS — K5900 Constipation, unspecified: Secondary | ICD-10-CM | POA: Diagnosis not present

## 2023-02-16 DIAGNOSIS — K229 Disease of esophagus, unspecified: Secondary | ICD-10-CM | POA: Diagnosis not present

## 2023-02-16 DIAGNOSIS — Z1211 Encounter for screening for malignant neoplasm of colon: Secondary | ICD-10-CM | POA: Insufficient documentation

## 2023-02-16 DIAGNOSIS — K31A0 Gastric intestinal metaplasia, unspecified: Secondary | ICD-10-CM | POA: Diagnosis not present

## 2023-02-16 DIAGNOSIS — K219 Gastro-esophageal reflux disease without esophagitis: Secondary | ICD-10-CM | POA: Diagnosis not present

## 2023-02-16 DIAGNOSIS — Z13810 Encounter for screening for upper gastrointestinal disorder: Secondary | ICD-10-CM | POA: Diagnosis not present

## 2023-02-16 DIAGNOSIS — F17210 Nicotine dependence, cigarettes, uncomplicated: Secondary | ICD-10-CM | POA: Insufficient documentation

## 2023-02-16 DIAGNOSIS — J449 Chronic obstructive pulmonary disease, unspecified: Secondary | ICD-10-CM | POA: Diagnosis not present

## 2023-02-16 DIAGNOSIS — K2289 Other specified disease of esophagus: Secondary | ICD-10-CM

## 2023-02-16 HISTORY — PX: COLONOSCOPY WITH PROPOFOL: SHX5780

## 2023-02-16 HISTORY — PX: ESOPHAGOGASTRODUODENOSCOPY (EGD) WITH PROPOFOL: SHX5813

## 2023-02-16 LAB — KOH PREP: KOH Prep: NONE SEEN

## 2023-02-16 SURGERY — ESOPHAGOGASTRODUODENOSCOPY (EGD) WITH PROPOFOL
Anesthesia: General

## 2023-02-16 MED ORDER — PROPOFOL 1000 MG/100ML IV EMUL
INTRAVENOUS | Status: AC
  Filled 2023-02-16: qty 100

## 2023-02-16 MED ORDER — LIDOCAINE HCL (CARDIAC) PF 100 MG/5ML IV SOSY
PREFILLED_SYRINGE | INTRAVENOUS | Status: DC | PRN
  Administered 2023-02-16: 100 mg via INTRAVENOUS

## 2023-02-16 MED ORDER — PROPOFOL 500 MG/50ML IV EMUL
INTRAVENOUS | Status: DC | PRN
  Administered 2023-02-16: 150 ug/kg/min via INTRAVENOUS

## 2023-02-16 MED ORDER — DEXMEDETOMIDINE HCL IN NACL 80 MCG/20ML IV SOLN
INTRAVENOUS | Status: DC | PRN
  Administered 2023-02-16: 4 ug via INTRAVENOUS

## 2023-02-16 MED ORDER — ONDANSETRON HCL 4 MG/2ML IJ SOLN
INTRAMUSCULAR | Status: AC
  Filled 2023-02-16: qty 2

## 2023-02-16 MED ORDER — SODIUM CHLORIDE 0.9 % IV SOLN: INTRAVENOUS | Status: DC

## 2023-02-16 MED ORDER — ONDANSETRON HCL 4 MG/2ML IJ SOLN
4.0000 mg | Freq: Once | INTRAMUSCULAR | Status: AC
  Administered 2023-02-16: 4 mg via INTRAVENOUS

## 2023-02-16 MED ORDER — GLYCOPYRROLATE 0.2 MG/ML IJ SOLN
INTRAMUSCULAR | Status: DC | PRN
  Administered 2023-02-16 (×2): .2 mg via INTRAVENOUS

## 2023-02-16 MED ORDER — PROPOFOL 10 MG/ML IV BOLUS
INTRAVENOUS | Status: DC | PRN
  Administered 2023-02-16: 70 mg via INTRAVENOUS

## 2023-02-16 NOTE — Anesthesia Procedure Notes (Signed)
Procedure Name: MAC Date/Time: 02/16/2023 7:45 AM  Performed by: Cheral Bay, CRNAPre-anesthesia Checklist: Patient identified, Emergency Drugs available, Suction available, Patient being monitored and Timeout performed Patient Re-evaluated:Patient Re-evaluated prior to induction Oxygen Delivery Method: Nasal cannula Induction Type: IV induction Placement Confirmation: positive ETCO2 and CO2 detector

## 2023-02-16 NOTE — Op Note (Signed)
[facility] Gastroenterology Patient Name: Julia Osborne Procedure Date: 02/16/2023 6:58 AM MRN: [id number] Account #: [id number] Date of Birth: 1961-03-25 Admit Type: Outpatient Age: 62 Room: [facility] ENDO ROOM 2 Gender: Female Note Status: Finalized Instrument Name: Prentice Docker [id number] Procedure:             Colonoscopy Indications:           High risk colon cancer surveillance: Personal history                         of hereditary nonpolyposis colorectal cancer (Lynch                         Syndrome), Incidental - Follow-up of colon cancer Providers:             Wyline Mood MD, MD Referring MD:          Durward Fortes. Hamrick (Referring MD) Medicines:             Monitored Anesthesia Care Complications:         No immediate complications. Procedure:             Pre-Anesthesia Assessment:                        - Prior to the procedure, a History and Physical was                         performed, and patient medications, allergies and                         sensitivities were reviewed. The patient's tolerance                         of previous anesthesia was reviewed.                        - The risks and benefits of the procedure and the                         sedation options and risks were discussed with the                         patient. All questions were answered and informed                         consent was obtained.                        - ASA Grade Assessment: II - A patient with mild                         systemic disease.                        After obtaining informed consent, the colonoscope was                         passed under direct vision. Throughout the procedure,  the patient's blood pressure, pulse, and oxygen                         saturations were monitored continuously. The                         Colonoscope was introduced through the anus and                         advanced to the the ileocolonic  anastomosis. The                         colonoscopy was performed with ease. The patient                         tolerated the procedure well. The quality of the bowel                         preparation was good. No anatomical landmarks were                         photographed. Findings:      The perianal and digital rectal examinations were normal.      There was evidence of a prior end-to-side ileo-colonic anastomosis in       the transverse colon. This was patent and was characterized by healthy       appearing mucosa. The anastomosis was traversed.      The exam was otherwise without abnormality on direct and retroflexion       views. Impression:            - Patent end-to-side ileo-colonic anastomosis,                         characterized by healthy appearing mucosa.                        - The examination was otherwise normal on direct and                         retroflexion views.                        - No specimens collected. Recommendation:        - Discharge patient to home (with escort).                        - Resume previous diet.                        - Continue present medications.                        - Repeat colonoscopy in 1 year for surveillance. Procedure Code(s):     --- Professional ---                        [phone], Colonoscopy, flexible; diagnostic, including                         collection of specimen(s) by brushing or washing, when  performed (separate procedure) CPT copyright 2022 [employer]. All rights reserved. The codes documented in this report are preliminary and upon coder review may  be revised to meet current compliance requirements. Wyline Mood, MD Wyline Mood MD, MD 02/16/2023 8:09:44 AM This report has been signed electronically. Number of Addenda: 0 Note Initiated On: 02/16/2023 6:58 AM Scope Withdrawal Time: 0 hours 4 minutes 56 seconds  Total Procedure Duration: 0 hours 7 minutes 54 seconds   Estimated Blood Loss:  Estimated blood loss: none.      [facility]

## 2023-02-16 NOTE — Anesthesia Preprocedure Evaluation (Signed)
Anesthesia Evaluation  Patient identified by MRN, date of birth, ID band Patient awake    Reviewed: Allergy & Precautions, NPO status , Patient's Chart, lab work & pertinent test results  History of Anesthesia Complications Negative for: history of anesthetic complications  Airway Mallampati: III  TM Distance: <3 FB Neck ROM: full    Dental  (+) Chipped, Poor Dentition, Missing   Pulmonary shortness of breath and with exertion, asthma , COPD, Current Smoker and Patient abstained from smoking.   Pulmonary exam normal        Cardiovascular Exercise Tolerance: Good (-) angina negative cardio ROS Normal cardiovascular exam     Neuro/Psych  PSYCHIATRIC DISORDERS      negative neurological ROS     GI/Hepatic Neg liver ROS,GERD  Controlled,,  Endo/Other  negative endocrine ROS    Renal/GU negative Renal ROS  negative genitourinary   Musculoskeletal   Abdominal   Peds  Hematology negative hematology ROS (+)   Anesthesia Other Findings Past Medical History: 09/13/2015: Adenocarcinoma of colon (HCC)     Comment:  Partial colon resection and chemo tx's.  No date: Anemia No date: Anxiety No date: Asthma No date: Cancer [facility])     Comment:  endometrial; cancer cells in intestine No date: COPD (chronic obstructive pulmonary disease) (HCC)     Comment:  no definite diagnosis No date: Depression No date: Diverticulitis 05/16/2015: Dyspareunia No date: Family history of cancer No date: Family history of kidney cancer No date: GERD (gastroesophageal reflux disease) No date: Glaucoma No date: Indigestion No date: Lynch syndrome No date: Neuropathy     Comment:  feet and hands No date: Uterine fibroid 05/16/2015: Vaginal dryness 07/16/2015: Vaginal itching No date: Vaginal Pap smear, abnormal  Past Surgical History: No date: ABDOMINAL HYSTERECTOMY No date: APPENDECTOMY 03/14/2015: BIOPSY; N/A     Comment:  Procedure:  BIOPSY;  Surgeon: Corbin Ade, MD;                Location: [facility] ORS;  Service: Endoscopy;  Laterality: N/A;               Gastric 01/28/2017: COLONOSCOPY; N/A     Comment:  Procedure: COLONOSCOPY;  Surgeon: Corbin Ade, MD;                Location: [facility];  Service: Endoscopy;                Laterality: N/A;  11:30am 03/14/2015: COLONOSCOPY WITH PROPOFOL; N/A     Comment:  RMR: Internal hemorrhoids. colonic diverticulosis.               Incomplete examination. Prepartation inadequate. 07/04/2015: COLONOSCOPY WITH PROPOFOL; N/A     Comment:  RMR: Colonic diverticulosis . Large polypoid lesion in               the vicinity of the hepatic flexure status post               saline-assisted piecmeal snare polypectomy  with ablation              and tattooing as described. Sigmoid polyp removed as               described above. sigmoid colon polyp hyperplastic,               hepatic flexure polyp with TA with focal high grade               dysplasia  11/03/2018:  COLONOSCOPY WITH PROPOFOL; N/A     Comment:  Procedure: COLONOSCOPY WITH PROPOFOL;  Surgeon: Wyline Mood, MD;  Location: [facility] ENDOSCOPY;  Service:               Gastroenterology;  Laterality: N/A; 12/09/2020: COLONOSCOPY WITH PROPOFOL; N/A     Comment:  Procedure: COLONOSCOPY WITH PROPOFOL;  Surgeon: Wyline Mood, MD;  Location: [facility] ENDOSCOPY;  Service:               Gastroenterology;  Laterality: N/A;  COVID POSITIVE               10/30/2020 01/20/2021: COLONOSCOPY WITH PROPOFOL; N/A     Comment:  Procedure: COLONOSCOPY WITH PROPOFOL;  Surgeon: Wyline Mood, MD;  Location: [facility] ENDOSCOPY;  Service:               Gastroenterology;  Laterality: N/A; 12/10/2021: COLONOSCOPY WITH PROPOFOL; N/A     Comment:  Procedure: COLONOSCOPY WITH PROPOFOL;  Surgeon: Wyline Mood, MD;  Location: [facility] ENDOSCOPY;  Service:               Gastroenterology;  Laterality: N/A; 02/12/2020:  ENTEROSCOPY; N/A     Comment:  Procedure: ENTEROSCOPY;  Surgeon: Wyline Mood, MD;                Location: [facility] ENDOSCOPY;  Service: Gastroenterology;                Laterality: N/A; 03/14/2015: ESOPHAGEAL DILATION; N/A     Comment:  Procedure: ESOPHAGEAL DILATION;  Surgeon: Corbin Ade, MD;  Location: [facility];  Service: Endoscopy;                Laterality: N/AElease Hashimoto 56 05/19/2016: ESOPHAGOGASTRODUODENOSCOPY; N/A     Comment:  Procedure: ESOPHAGOGASTRODUODENOSCOPY (EGD);  Surgeon:               Corbin Ade, MD;  Location: [facility];  Service:               Endoscopy;  Laterality: N/A;  215 01/28/2017: ESOPHAGOGASTRODUODENOSCOPY; N/A     Comment:  Procedure: ESOPHAGOGASTRODUODENOSCOPY (EGD);  Surgeon:               Corbin Ade, MD;  Location: [facility];  Service:               Endoscopy;  Laterality: N/A; 12/10/2021: ESOPHAGOGASTRODUODENOSCOPY; N/A     Comment:  Procedure: ESOPHAGOGASTRODUODENOSCOPY (EGD);  Surgeon:               Wyline Mood, MD;  Location: [facility] ENDOSCOPY;  Service:               Gastroenterology;  Laterality: N/A; 03/14/2015: ESOPHAGOGASTRODUODENOSCOPY (EGD) WITH PROPOFOL; N/A     Comment:  RMR: Mild erosive reflux esophagitis status post passage              o f a Maloney dilator. Abnormal gastric mucosa of               uncertain significance as described above. status post  biopsy, benign 11/18/2017: ESOPHAGOGASTRODUODENOSCOPY (EGD) WITH PROPOFOL; N/A     Comment:  Procedure: ESOPHAGOGASTRODUODENOSCOPY (EGD) WITH               PROPOFOL;  Surgeon: Corbin Ade, MD;  [facility]               END[facility];  Service: Endoscopy;  Laterality: N/A;                12:15pm 11/03/2018: ESOPHAGOGASTRODUODENOSCOPY (EGD) WITH PROPOFOL; N/A     Comment:  Procedure: ESOPHAGOGASTRODUODENOSCOPY (EGD) WITH               PROPOFOL;  Surgeon: Wyline Mood, MD;  Location: [facility]               ENDOSCOPY;  Service: Gastroenterology;  Laterality:  N/A; 12/25/2019: GIVENS CAPSULE STUDY; N/A     Comment:  Procedure: GIVENS CAPSULE STUDY;  Surgeon: Wyline Mood,               MD;  Location: [facility] ENDOSCOPY;  Service:               Gastroenterology;  Laterality: N/A; 05/19/2016: MALONEY DILATION; N/A     Comment:  Procedure: Alvy Beal;  Surgeon: Corbin Ade,               MD;  Location[facility];  Service: Endoscopy;                Laterality: N/A; 01/28/2017: MALONEY DILATION; N/A     Comment:  Procedure: Alvy Beal;  Surgeon: Corbin Ade,               MD;  [facility];  Service: Endoscopy;                Laterality: N/A; 11/18/2017: [name] DILATION; N/A     Comment:  Procedure: Alvy Beal;  Surgeon: Corbin Ade,               MD;  Location: [facility];  Service: Endoscopy;                Laterality: N/A; 08/30/2015: PARTIAL COLECTOMY     Comment:  polyp with adenocarcinoma 07/04/2015: POLYPECTOMY; N/A     Comment:  Procedure: POLYPECTOMY;  Surgeon: Corbin Ade, MD;                Location: [facility] ORS;  Service: Endoscopy;  Laterality: N/A; 09/2014: PORTACATH PLACEMENT; Right No date: TUBAL LIGATION  BMI    Body Mass Index: 22.14 kg/m      Reproductive/Obstetrics negative OB ROS                             Anesthesia Physical Anesthesia Plan  ASA: 3  Anesthesia Plan: General   Post-op Pain Management:    Induction: Intravenous  PONV Risk Score and Plan: Propofol infusion and TIVA  Airway Management Planned: Natural Airway and Nasal Cannula  Additional Equipment:   Intra-op Plan:   Post-operative Plan:   Informed Consent: I have reviewed the patients History and Physical, chart, labs and discussed the procedure including the risks, benefits and alternatives for the proposed anesthesia with the patient or authorized representative who has indicated his/her understanding and acceptance.     Dental Advisory Given  Plan Discussed with:  Anesthesiologist, CRNA and Surgeon  Anesthesia Plan Comments: (Patient consented for risks of anesthesia including  but not limited to:  - adverse reactions to medications - risk of airway placement if required - damage to eyes, teeth, lips or other oral mucosa - nerve damage due to positioning  - sore throat or hoarseness - Damage to heart, brain, nerves, lungs, other parts of body or loss of life  Patient voiced understanding.)       Anesthesia Quick Evaluation

## 2023-02-16 NOTE — H&P (Signed)
Wyline Mood, MD [address], [address], [place], [place], [address] [address], [place], [place], [address] Phone: [phone]  Fax: [phone]  Primary Care Physician:  Ailene Ravel, MD   Pre-Procedure History & Physical: HPI:  Julia Osborne is a 62 y.o. female is here for an endoscopy and colonoscopy    Past Medical History:  Diagnosis Date   Adenocarcinoma of colon (HCC) 09/13/2015   Partial colon resection and chemo tx's.    Anemia    Anxiety    Asthma    Cancer (HCC)    endometrial; cancer cells in intestine   COPD (chronic obstructive pulmonary disease) (HCC)    no definite diagnosis   Depression    Diverticulitis    Dyspareunia 05/16/2015   Family history of cancer    Family history of kidney cancer    GERD (gastroesophageal reflux disease)    Glaucoma    Indigestion    Lynch syndrome    Neuropathy    feet and hands   Uterine fibroid    Vaginal dryness 05/16/2015   Vaginal itching 07/16/2015   Vaginal Pap smear, abnormal     Past Surgical History:  Procedure Laterality Date   ABDOMINAL HYSTERECTOMY     APPENDECTOMY     BIOPSY N/A 03/14/2015   Procedure: BIOPSY;  Surgeon: Corbin Ade, MD;  Location: [facility];  Service: Endoscopy;  Laterality: N/A;  Gastric   COLONOSCOPY N/A 01/28/2017   Procedure: COLONOSCOPY;  Surgeon: Corbin Ade, MD;  Location: [facility];  Service: Endoscopy;  Laterality: N/A;  11:30am   COLONOSCOPY WITH PROPOFOL N/A 03/14/2015   RMR: Internal hemorrhoids. colonic diverticulosis. Incomplete examination. Prepartation inadequate.   COLONOSCOPY WITH PROPOFOL N/A 07/04/2015   RMR: Colonic diverticulosis . Large polypoid lesion in the vicinity of the hepatic flexure status post saline-assisted piecmeal snare polypectomy  with ablation and tattooing as described. Sigmoid polyp removed as described above. sigmoid colon polyp hyperplastic, hepatic flexure polyp with TA with focal high grade dysplasia    COLONOSCOPY  WITH PROPOFOL N/A 11/03/2018   Procedure: COLONOSCOPY WITH PROPOFOL;  Surgeon: Wyline Mood, MD;  Location: [facility] ENDOSCOPY;  Service: Gastroenterology;  Laterality: N/A;   COLONOSCOPY WITH PROPOFOL N/A 12/09/2020   Procedure: COLONOSCOPY WITH PROPOFOL;  Surgeon: Wyline Mood, MD;  Location: [facility] ENDOSCOPY;  Service: Gastroenterology;  Laterality: N/A;  COVID POSITIVE 10/30/2020   COLONOSCOPY WITH PROPOFOL N/A 01/20/2021   Procedure: COLONOSCOPY WITH PROPOFOL;  Surgeon: Wyline Mood, MD;  Location: [facility] ENDOSCOPY;  Service: Gastroenterology;  Laterality: N/A;   COLONOSCOPY WITH PROPOFOL N/A 12/10/2021   Procedure: COLONOSCOPY WITH PROPOFOL;  Surgeon: Wyline Mood, MD;  Location: [facility] ENDOSCOPY;  Service: Gastroenterology;  Laterality: N/A;   ENTEROSCOPY N/A 02/12/2020   Procedure: ENTEROSCOPY;  Surgeon: Wyline Mood, MD;  Location: [facility] ENDOSCOPY;  Service: Gastroenterology;  Laterality: N/A;   ESOPHAGEAL DILATION N/A 03/14/2015   Procedure: ESOPHAGEAL DILATION;  Surgeon: Corbin Ade, MD;  Location: [facility];  Service: Endoscopy;  Laterality: N/AElease Hashimoto 54   ESOPHAGOGASTRODUODENOSCOPY N/A 05/19/2016   Procedure: ESOPHAGOGASTRODUODENOSCOPY (EGD);  Surgeon: Corbin Ade, MD;  [facility];  Service: Endoscopy;  Laterality: N/A;  215   ESOPHAGOGASTRODUODENOSCOPY N/A 01/28/2017   Procedure: ESOPHAGOGASTRODUODENOSCOPY (EGD);  Surgeon: Corbin Ade, MD;  [facility];  Service: Endoscopy;  Laterality: N/A;   ESOPHAGOGASTRODUODENOSCOPY N/A 12/10/2021   Procedure: ESOPHAGOGASTRODUODENOSCOPY (EGD);  Surgeon: Wyline Mood, MD;  Location: [facility] ENDOSCOPY;  Service:  Gastroenterology;  Laterality: N/A;   ESOPHAGOGASTRODUODENOSCOPY (EGD) WITH PROPOFOL N/A 03/14/2015   RMR: Mild erosive reflux esophagitis status post passage o f a Maloney dilator. Abnormal gastric mucosa of uncertain significance as described above. status post biopsy, benign   ESOPHAGOGASTRODUODENOSCOPY (EGD) WITH PROPOFOL N/A  11/18/2017   Procedure: ESOPHAGOGASTRODUODENOSCOPY (EGD) WITH PROPOFOL;  Surgeon: Corbin Ade, MD;  [facility];  Service: Endoscopy;  Laterality: N/A;  12:15pm   ESOPHAGOGASTRODUODENOSCOPY (EGD) WITH PROPOFOL N/A 11/03/2018   Procedure: ESOPHAGOGASTRODUODENOSCOPY (EGD) WITH PROPOFOL;  Surgeon: Wyline Mood, MD;  Location: [facility] ENDOSCOPY;  Service: Gastroenterology;  Laterality: N/A;   GIVENS CAPSULE STUDY N/A 12/25/2019   Procedure: GIVENS CAPSULE STUDY;  Surgeon: Wyline Mood, MD;  Location: [facility] ENDOSCOPY;  Service: Gastroenterology;  Laterality: N/A;   MALONEY DILATION N/A 05/19/2016   Procedure: [name] DILATION;  Surgeon: Corbin Ade, MD;  [facility];  Service: Endoscopy;  Laterality: N/A;   MALONEY DILATION N/A 01/28/2017   Procedure: [name] DILATION;  Surgeon: Corbin Ade, MD;  [facility];  Service: Endoscopy;  Laterality: N/A;   MALONEY DILATION N/A 11/18/2017   Procedure: [name] DILATION;  Surgeon: Corbin Ade, MD;  [facility]: [facility];  Service: Endoscopy;  Laterality: N/A;   PARTIAL COLECTOMY  08/30/2015   polyp with adenocarcinoma   POLYPECTOMY N/A 07/04/2015   Procedure: POLYPECTOMY;  Surgeon: Corbin Ade, MD;  Location: [facility];  Service: Endoscopy;  Laterality: N/A;   PORTACATH PLACEMENT Right 09/2014   TUBAL LIGATION      Prior to Admission medications   Medication Sig Start Date End Date Taking? Authorizing Provider  albuterol (PROVENTIL HFA;VENTOLIN HFA) 108 (90 Base) MCG/ACT inhaler Inhale 2 puffs into the lungs every 2 (two) hours as needed for wheezing or shortness of breath (cough). 10/17/15  Yes Gelene Mink, NP  albuterol (PROVENTIL) (2.5 MG/3ML) 0.083% nebulizer solution Take 3 mLs (2.5 mg total) by nebulization every 6 (six) hours as needed for wheezing or shortness of breath. 01/16/16  Yes Penland, Novella Olive, MD  escitalopram (LEXAPRO) 20 MG tablet Take 20 mg by mouth daily. 10/23/21  Yes [provider]   fluticasone (FLONASE) 50 MCG/ACT nasal spray Place 1 spray into both nostrils daily. 12/06/22 12/06/23 Yes Pilar Jarvis, MD  gabapentin (NEURONTIN) 300 MG capsule Take 300 mg by mouth 2 (two) times daily. 03/10/19  Yes [provider]  loratadine (CLARITIN) 10 MG tablet Take 10 mg by mouth daily as needed for allergies.    Yes [provider]  LUMIGAN 0.01 % SOLN Place 1 drop into the right eye at bedtime. 06/24/18  Yes [provider]  meloxicam (MOBIC) 7.5 MG tablet Take 7.5 mg by mouth daily.   Yes [provider]  Plecanatide (TRULANCE) 3 MG TABS Take 1 tablet by mouth daily. 11/20/21  Yes Wyline Mood, MD  traZODone (DESYREL) 50 MG tablet Take 50 mg by mouth at bedtime.   Yes [provider]  ezetimibe (ZETIA) 10 MG tablet 10 mg. Patient not taking: Reported on 01/25/2023 09/26/22   [provider]  meclizine (ANTIVERT) 25 MG tablet Take 1 tablet (25 mg total) by mouth 3 (three) times daily as needed for dizziness. 12/06/22   Pilar Jarvis, MD  montelukast (SINGULAIR) 10 MG tablet 10 mg. 12/25/22   [provider]  Naphazoline-Pheniramine (ALLERGY EYE OP) Place 1-2 drops into both eyes 2 (two) times daily as needed (ITCHY, WATERY EYES).    [provider]  omeprazole (PRILOSEC) 40 MG capsule Take 1 capsule (40 mg total) by mouth daily. Patient not taking: Reported on 02/16/2023 09/11/20   Wyline Mood, MD  ondansetron (ZOFRAN ODT) 4 MG disintegrating tablet Take 1 tablet (4 mg total) by mouth every 8 (eight) hours as needed. Patient not taking: Reported on 01/25/2023 04/09/21   Jene Every, MD  ondansetron (ZOFRAN) 8 MG tablet Take 8 mg by mouth daily as needed (CAR SICKNESS). Patient not taking: Reported on 01/25/2023    [provider]  PAZEO 0.7 % SOLN Place 1 drop into both eyes every morning. 06/27/18   [provider]  promethazine (PHENERGAN) 25 MG tablet Take 25 mg by mouth every 6 (six) hours as needed. Patient  not taking: Reported on 01/25/2023 01/07/23   [provider]  SIMBRINZA 1-0.2 % SUSP Apply 1 drop to eye 2 (two) times daily. 04/04/19   [provider]  sodium chloride (OCEAN) 0.65 % SOLN nasal spray Place 1 spray into both nostrils as needed for congestion.    [provider]    Allergies as of 01/21/2023 - Review Complete 01/21/2023  Allergen Reaction Noted   Codeine Rash 08/25/2011    Family History  Problem Relation Age of Onset   Other Mother        clot that went to heart, deceased age 40ss   Heart attack Father        age 27s, deceased   Stroke Father    Cancer Father        "at death determined he was ate up with cancer"   Hypertension Sister    Kidney cancer Sister 65   Diabetes Brother    Hypertension Brother    Endometriosis Daughter    Heart attack Maternal Grandfather    Diabetes Sister    Brain cancer Paternal Aunt    Cancer Paternal Uncle        NOS   Cancer Paternal Uncle        NOS   Cancer Paternal Uncle        NOS   Colon cancer Neg Hx     Social History   Socioeconomic History   Marital status: Widowed    Spouse name: Not on file   Number of children: 2   Years of education: Not on file   Highest education level: Not on file  Occupational History   Not on file  Tobacco Use   Smoking status: Every Day    Packs/day: 2.00    Years: 25.00    Additional pack years: 0.00    Total pack years: 50.00    Types: Cigarettes   Smokeless tobacco: Never   Tobacco comments:    Is trying to quit/wean off.  Vaping Use   Vaping Use: Never used  Substance and Sexual Activity   Alcohol use: No   Drug use: No   Sexual activity: Not Currently    Birth control/protection: Surgical    Comment: hyst  Other Topics Concern   Not on file  Social History Narrative   #  lives [place];self; smoking- 1/2 ppd; no alcohol; used to run machines;       FHx-Dad- MI; ? Cancer on autopsy; sisters/aunts- brain; lung cancer x2; oldest  sister- kidney cancer      Pt has 37 year old son;daughter 33 years. Brothers- states to have spoken to her family re: importance of them being checked for lynch.    Social Determinants of Corporate investment banker  Strain: Medium Risk (12/07/2022)   Overall Financial Resource Strain (CARDIA)    Difficulty of Paying Living Expenses: Somewhat hard  Food Insecurity: Food Insecurity Present (12/07/2022)   Hunger Vital Sign    Worried About Running Out of Food in the Last Year: Often true    Ran Out of Food in the Last Year: Often true  Transportation Needs: No Transportation Needs (12/07/2022)   PRAPARE - Administrator, Civil Service (Medical): No    Lack of Transportation (Non-Medical): No  Physical Activity: Inactive (12/07/2022)   Exercise Vital Sign    Days of Exercise per Week: 0 days    Minutes of Exercise per Session: 0 min  Stress: Stress Concern Present (12/07/2022)   [employer] of Occupational Health - Occupational Stress Questionnaire    Feeling of Stress : To some extent  Social Connections: Socially Isolated (12/07/2022)   Social Connection and Isolation Panel [NHANES]    Frequency of Communication with Friends and Family: Once a week    Frequency of Social Gatherings with Friends and Family: Never    Attends Religious Services: Never    Database administrator or Organizations: No    Attends Banker Meetings: Never    Marital Status: Widowed  Intimate Partner Violence: Not At Risk (12/07/2022)   Humiliation, Afraid, Rape, and Kick questionnaire    Fear of Current or Ex-Partner: No    Emotionally Abused: No    Physically Abused: No    Sexually Abused: No    Review of Systems: See HPI, otherwise negative ROS  Physical Exam: BP 136/84   Pulse 72   Temp (!) 97.2 F (36.2 C) (Temporal)   Resp 18   Ht 5\' 3"  (1.6 m)   Wt 56.7 kg   SpO2 100%   BMI 22.14 kg/m  General:   Alert,  pleasant and cooperative in NAD Head:  Normocephalic and  atraumatic. Neck:  Supple; no masses or thyromegaly. Lungs:  Clear throughout to auscultation, normal respiratory effort.    Heart:  +S1, +S2, Regular rate and rhythm, No edema. Abdomen:  Soft, nontender and nondistended. Normal bowel sounds, without guarding, and without rebound.   Neurologic:  Alert and  oriented x4;  grossly normal neurologically.  Impression/Plan: Julia Osborne is here for an endoscopy and colonoscopy  to be performed for  evaluation of dysphagia, weight loss, lynch syndrome, gastric mapping and c olon cancer ascreening     Risks, benefits, limitations, and alternatives regarding endoscopy have been reviewed with the patient.  Questions have been answered.  All parties agreeable.   Wyline Mood, MD  02/16/2023, 7:39 AM

## 2023-02-16 NOTE — Transfer of Care (Signed)
Immediate Anesthesia Transfer of Care Note  Patient: Julia Osborne  Procedure(s) Performed: ESOPHAGOGASTRODUODENOSCOPY (EGD) WITH PROPOFOL COLONOSCOPY WITH PROPOFOL  Patient Location: PACU and Endoscopy Unit  Anesthesia Type:General  Level of Consciousness: awake  Airway & Oxygen Therapy: Patient Spontanous Breathing  Post-op Assessment: Report given to RN and Post -op Vital signs reviewed and stable  Post vital signs: Reviewed and stable  Last Vitals:  Vitals Value Taken Time  BP 111/64 02/16/23 0813  Temp 35.6 C 02/16/23 0812  Pulse 75 02/16/23 0813  Resp 8 02/16/23 0813  SpO2 99 % 02/16/23 0813  Vitals shown include unvalidated device data.  Last Pain:  Vitals:   02/16/23 0812  TempSrc: Temporal  PainSc: 0-No pain         Complications: No notable events documented.

## 2023-02-16 NOTE — Anesthesia Postprocedure Evaluation (Signed)
Anesthesia Post Note  Patient: Julia Osborne  Procedure(s) Performed: ESOPHAGOGASTRODUODENOSCOPY (EGD) WITH PROPOFOL COLONOSCOPY WITH PROPOFOL  Patient location during evaluation: Endoscopy Anesthesia Type: General Level of consciousness: awake and alert Pain management: pain level controlled Vital Signs Assessment: post-procedure vital signs reviewed and stable Respiratory status: spontaneous breathing, nonlabored ventilation, respiratory function stable and patient connected to nasal cannula oxygen Cardiovascular status: blood pressure returned to baseline and stable Postop Assessment: no apparent nausea or vomiting Anesthetic complications: no   No notable events documented.   Last Vitals:  Vitals:   02/16/23 0822 02/16/23 0835  BP: 133/84 (!) 157/77  Pulse: 80   Resp: 13   Temp:    SpO2: 100%     Last Pain:  Vitals:   02/16/23 0822  TempSrc:   PainSc: 0-No pain                 Cleda Mccreedy Taggart Prasad

## 2023-02-16 NOTE — Op Note (Signed)
[facility] Gastroenterology Patient Name: Julia Osborne Procedure Date: 02/16/2023 7:10 AM MRN: [id number] Account #: [id number] Date of Birth: 1961-07-18 Admit Type: Outpatient Age: 62 Room: [facility] ENDO ROOM 2 Gender: Female Note Status: Finalized Instrument Name: Patton Salles Endoscope [id number] Procedure:             Upper GI endoscopy Indications:           Screening procedure, Dysphagia, Hereditary                         nonpolyposis colorectal cancer (Lynch Syndrome) Providers:             Wyline Mood MD, MD Referring MD:          Durward Fortes. Hamrick (Referring MD) Medicines:             Monitored Anesthesia Care Complications:         No immediate complications. Procedure:             Pre-Anesthesia Assessment:                        - Prior to the procedure, a History and Physical was                         performed, and patient medications, allergies and                         sensitivities were reviewed. The patient's tolerance                         of previous anesthesia was reviewed.                        - The risks and benefits of the procedure and the                         sedation options and risks were discussed with the                         patient. All questions were answered and informed                         consent was obtained.                        - ASA Grade Assessment: II - A patient with mild                         systemic disease.                        After obtaining informed consent, the endoscope was                         passed under direct vision. Throughout the procedure,                         the patient's blood pressure, pulse, and oxygen                         saturations  were monitored continuously. The Endoscope                         was introduced through the mouth, and advanced to the                         third part of duodenum. The upper GI endoscopy was                         accomplished with ease. The  patient tolerated the                         procedure well. Findings:      Patchy, white plaques were found in the entire esophagus. Brushings for       KOH prep were obtained in the entire esophagus.      The examined duodenum was normal.      Localized mild mucosal changes characterized by granularity and a       decreased vascular pattern were found on the lesser curvature of the       stomach. Biopsies were taken with a cold forceps for histology.      The exam was otherwise without abnormality.      Normal mucosa was found at the incisura and in the gastric antrum.       Biopsies were taken with a cold forceps for histology.      The cardia and gastric fundus were normal on retroflexion. Impression:            - Esophageal plaques were found, suspicious for                         candidiasis. Brushings performed.                        - Normal examined duodenum.                        - Granular and decreased vascular pattern mucosa in                         the lesser curvature. Biopsied.                        - The examination was otherwise normal.                        - Normal mucosa was found in the incisura and in the                         antrum. Biopsied. Recommendation:        - Await pathology results.                        - Perform a colonoscopy today. Procedure Code(s):     --- Professional ---                        [phone], Esophagogastroduodenoscopy, flexible,                         transoral; with biopsy, single or multiple  Diagnosis Code(s):     --- Professional ---                        K22.9, Disease of esophagus, unspecified                        K31.89, Other diseases of stomach and duodenum                        Z13.810, Encounter for screening for upper                         gastrointestinal disorder                        R13.10, Dysphagia, unspecified                        C18.9, Malignant neoplasm of colon, unspecified                         Z15.09, Genetic susceptibility to other malignant                         neoplasm CPT copyright 2022 American Medical Association. All rights reserved. The codes documented in this report are preliminary and upon coder review may  be revised to meet current compliance requirements. Wyline Mood, MD Wyline Mood MD, MD 02/16/2023 7:57:45 AM This report has been signed electronically. Number of Addenda: 0 Note Initiated On: 02/16/2023 7:10 AM Estimated Blood Loss:  Estimated blood loss: none.      [facility]

## 2023-02-17 ENCOUNTER — Encounter: Payer: Self-pay | Admitting: Gastroenterology

## 2023-02-22 ENCOUNTER — Ambulatory Visit: Admission: RE | Admit: 2023-02-22 | Payer: Medicare HMO | Source: Ambulatory Visit

## 2023-02-24 ENCOUNTER — Ambulatory Visit
Admission: RE | Admit: 2023-02-24 | Discharge: 2023-02-24 | Disposition: A | Discharge status: Home / Self Care | Payer: Medicare HMO | Source: Ambulatory Visit | Attending: Sports Medicine | Admitting: Sports Medicine

## 2023-02-24 ENCOUNTER — Ambulatory Visit
Admission: RE | Admit: 2023-02-24 | Discharge: 2023-02-24 | Disposition: A | Discharge status: Home / Self Care | Payer: Medicare HMO | Source: Ambulatory Visit | Attending: Acute Care | Admitting: Acute Care

## 2023-02-24 DIAGNOSIS — F17210 Nicotine dependence, cigarettes, uncomplicated: Secondary | ICD-10-CM | POA: Insufficient documentation

## 2023-02-24 DIAGNOSIS — M25562 Pain in left knee: Secondary | ICD-10-CM | POA: Insufficient documentation

## 2023-02-24 DIAGNOSIS — Z122 Encounter for screening for malignant neoplasm of respiratory organs: Secondary | ICD-10-CM | POA: Insufficient documentation

## 2023-02-24 DIAGNOSIS — M25469 Effusion, unspecified knee: Secondary | ICD-10-CM | POA: Diagnosis not present

## 2023-02-24 DIAGNOSIS — M67462 Ganglion, left knee: Secondary | ICD-10-CM | POA: Insufficient documentation

## 2023-02-24 DIAGNOSIS — I700 Atherosclerosis of aorta: Secondary | ICD-10-CM | POA: Diagnosis not present

## 2023-02-24 DIAGNOSIS — I2510 Atherosclerotic heart disease of native coronary artery without angina pectoris: Secondary | ICD-10-CM | POA: Insufficient documentation

## 2023-02-24 DIAGNOSIS — Z87891 Personal history of nicotine dependence: Secondary | ICD-10-CM

## 2023-02-24 DIAGNOSIS — G8929 Other chronic pain: Secondary | ICD-10-CM | POA: Diagnosis not present

## 2023-02-24 MED ORDER — GADOBUTROL 1 MMOL/ML IV SOLN
5.0000 mL | Freq: Once | INTRAVENOUS | Status: AC | PRN
  Administered 2023-02-24: 5 mL via INTRAVENOUS

## 2023-03-03 ENCOUNTER — Other Ambulatory Visit: Payer: Self-pay | Admitting: Acute Care

## 2023-03-03 DIAGNOSIS — Z87891 Personal history of nicotine dependence: Secondary | ICD-10-CM

## 2023-03-03 DIAGNOSIS — H04123 Dry eye syndrome of bilateral lacrimal glands: Secondary | ICD-10-CM | POA: Diagnosis not present

## 2023-03-03 DIAGNOSIS — F17210 Nicotine dependence, cigarettes, uncomplicated: Secondary | ICD-10-CM

## 2023-03-03 DIAGNOSIS — H16223 Keratoconjunctivitis sicca, not specified as Sjogren's, bilateral: Secondary | ICD-10-CM | POA: Diagnosis not present

## 2023-03-03 DIAGNOSIS — Z122 Encounter for screening for malignant neoplasm of respiratory organs: Secondary | ICD-10-CM

## 2023-03-03 DIAGNOSIS — H401132 Primary open-angle glaucoma, bilateral, moderate stage: Secondary | ICD-10-CM | POA: Diagnosis not present

## 2023-03-08 NOTE — Progress Notes (Signed)
Bx show no intestinal metaplasia which is good news for her , just minimal inflammation noted

## 2023-03-11 ENCOUNTER — Telehealth: Payer: Self-pay

## 2023-03-11 NOTE — Telephone Encounter (Signed)
Patient called me back and I was able to let her know about her results and she was happy to hear. Patient had no further questions.

## 2023-03-11 NOTE — Telephone Encounter (Signed)
Called patient and left her a detailed message on her phone with the below information and if she had any questions, to please call me back.

## 2023-03-11 NOTE — Telephone Encounter (Signed)
-----   Message from Wyline Mood, MD sent at 03/08/2023 12:48 PM EDT ----- Bx show no intestinal metaplasia which is good news for her , just minimal inflammation noted

## 2023-03-24 DIAGNOSIS — G8929 Other chronic pain: Secondary | ICD-10-CM | POA: Diagnosis not present

## 2023-03-24 DIAGNOSIS — M25562 Pain in left knee: Secondary | ICD-10-CM | POA: Diagnosis not present

## 2023-03-30 ENCOUNTER — Other Ambulatory Visit: Payer: Self-pay | Admitting: Orthopedic Surgery

## 2023-03-31 DIAGNOSIS — E538 Deficiency of other specified B group vitamins: Secondary | ICD-10-CM | POA: Diagnosis not present

## 2023-03-31 DIAGNOSIS — F411 Generalized anxiety disorder: Secondary | ICD-10-CM | POA: Diagnosis not present

## 2023-03-31 DIAGNOSIS — R634 Abnormal weight loss: Secondary | ICD-10-CM | POA: Diagnosis not present

## 2023-03-31 DIAGNOSIS — I2510 Atherosclerotic heart disease of native coronary artery without angina pectoris: Secondary | ICD-10-CM | POA: Diagnosis not present

## 2023-03-31 DIAGNOSIS — I700 Atherosclerosis of aorta: Secondary | ICD-10-CM | POA: Diagnosis not present

## 2023-03-31 DIAGNOSIS — R7303 Prediabetes: Secondary | ICD-10-CM | POA: Diagnosis not present

## 2023-03-31 DIAGNOSIS — F331 Major depressive disorder, recurrent, moderate: Secondary | ICD-10-CM | POA: Diagnosis not present

## 2023-03-31 DIAGNOSIS — J439 Emphysema, unspecified: Secondary | ICD-10-CM | POA: Diagnosis not present

## 2023-03-31 DIAGNOSIS — E785 Hyperlipidemia, unspecified: Secondary | ICD-10-CM | POA: Diagnosis not present

## 2023-04-03 ENCOUNTER — Encounter (HOSPITAL_COMMUNITY): Payer: Self-pay | Admitting: Urgent Care

## 2023-04-06 ENCOUNTER — Encounter
Admission: RE | Admit: 2023-04-06 | Discharge: 2023-04-06 | Disposition: A | Discharge status: Home / Self Care | Payer: Medicare HMO | Source: Ambulatory Visit | Attending: Orthopedic Surgery | Admitting: Orthopedic Surgery

## 2023-04-06 ENCOUNTER — Other Ambulatory Visit: Payer: Self-pay

## 2023-04-06 VITALS — Ht 63.0 in | Wt 118.0 lb

## 2023-04-06 DIAGNOSIS — J440 Chronic obstructive pulmonary disease with acute lower respiratory infection: Secondary | ICD-10-CM

## 2023-04-06 DIAGNOSIS — C189 Malignant neoplasm of colon, unspecified: Secondary | ICD-10-CM

## 2023-04-06 DIAGNOSIS — K625 Hemorrhage of anus and rectum: Secondary | ICD-10-CM

## 2023-04-06 HISTORY — DX: Other diseases of stomach and duodenum: K31.89

## 2023-04-06 HISTORY — DX: Abnormal findings on diagnostic imaging of other parts of digestive tract: R93.3

## 2023-04-06 HISTORY — DX: Aphasia: R47.01

## 2023-04-06 HISTORY — DX: Other chronic pain: G89.29

## 2023-04-06 HISTORY — DX: Gastric intestinal metaplasia, unspecified: K31.A0

## 2023-04-06 HISTORY — DX: Prediabetes: R73.03

## 2023-04-06 HISTORY — DX: Unspecified abnormal cytological findings in specimens from vagina: R87.629

## 2023-04-06 HISTORY — DX: Hemorrhage of anus and rectum: K62.5

## 2023-04-06 HISTORY — DX: Melena: K92.1

## 2023-04-06 HISTORY — DX: Constipation, unspecified: K59.00

## 2023-04-06 HISTORY — DX: Unilateral primary osteoarthritis, left knee: M17.12

## 2023-04-06 HISTORY — DX: Personal history of colon polyps, unspecified: Z86.0100

## 2023-04-06 HISTORY — DX: Epigastric pain: R10.13

## 2023-04-06 HISTORY — DX: Nausea with vomiting, unspecified: R11.2

## 2023-04-06 HISTORY — DX: Personal history of colonic polyps: Z86.010

## 2023-04-06 HISTORY — DX: Other dysphagia: R13.19

## 2023-04-06 HISTORY — DX: Effusion, unspecified knee: M25.469

## 2023-04-06 HISTORY — DX: Anxiety disorder, unspecified: F41.9

## 2023-04-06 HISTORY — DX: Ganglion, left knee: M67.462

## 2023-04-06 HISTORY — DX: Other specified postprocedural states: Z98.890

## 2023-04-06 NOTE — Patient Instructions (Addendum)
Your procedure is scheduled ZO:XWRUEAV April 13, 2023. Report to the Registration Desk on the 1st floor of the [facility]. To find out your arrival time, please call [phone] between 1PM - 3PM on: Monday April 12, 2023. If your arrival time is 6:00 am, do not arrive before that time as the [facility] Mall entrance doors do not open until 6:00 am.  REMEMBER: Instructions that are not followed completely may result in serious medical risk, up to and including death; or upon the discretion of your surgeon and anesthesiologist your surgery may need to be rescheduled.  Do not eat food after midnight the night before surgery.  No gum chewing or hard candies.  You may however, drink CLEAR liquids up to 2 hours before you are scheduled to arrive for your surgery. Do not drink anything within 2 hours of your scheduled arrival time.  Clear liquids include: - water  - apple juice without pulp - gatorade (not RED colors) - black coffee or tea (Do NOT add milk or creamers to the coffee or tea) Do NOT drink anything that is not on this list.  In addition, your doctor has ordered for you to drink the provided:  Ensure Pre-Surgery Clear Carbohydrate Drink  Drinking this carbohydrate drink up to two hours before surgery helps to reduce insulin resistance and improve patient outcomes. Please complete drinking 2 hours before scheduled arrival time.  One week prior to surgery: Stop Anti-inflammatories (NSAIDS) such as Advil, Aleve, Ibuprofen, Motrin, meloxicam (MOBIC), Naproxen, Naprosyn and Aspirin based products such as Excedrin, Goody's Powder, BC Powder. Stop ANY OVER THE COUNTER supplements until after surgery. You may however, continue to take Tylenol if needed for pain up until the day of surgery.  Continue taking all prescribed medications with the exception of the following:  Follow recommendations from Cardiologist or PCP regarding stopping blood thinners.  TAKE ONLY THESE MEDICATIONS THE  MORNING OF SURGERY WITH A SIP OF WATER:  None  Use inhalers on the day of surgery and bring to the hospital. albuterol (PROVENTIL HFA;VENTOLIN HFA) 108 (90 Base) MCG/ACT inhaler   No Alcohol for 24 hours before or after surgery.  No Smoking including e-cigarettes for 24 hours before surgery.  No chewable tobacco products for at least 6 hours before surgery.  No nicotine patches on the day of surgery.  Do not use any "recreational" drugs for at least a week (preferably 2 weeks) before your surgery.  Please be advised that the combination of cocaine and anesthesia may have negative outcomes, up to and including death. If you test positive for cocaine, your surgery will be cancelled.  On the morning of surgery brush your teeth with toothpaste and water, you may rinse your mouth with mouthwash if you wish. Do not swallow any toothpaste or mouthwash.  Use CHG Soap or wipes as directed on instruction sheet.  Do not wear jewelry, make-up, hairpins, clips or nail polish.  Do not wear lotions, powders, or perfumes.   Do not shave body hair from the neck down 48 hours before surgery.  Contact lenses, hearing aids and dentures may not be worn into surgery.  Do not bring valuables to the hospital. [facility] is not responsible for any missing/lost belongings or valuables.   Notify your doctor if there is any change in your medical condition (cold, fever, infection).  Wear comfortable clothing (specific to your surgery type) to the hospital.  After surgery, you can help prevent lung complications by doing breathing exercises.  Take deep breaths and cough every 1-2 hours. Your doctor may order a device called an Incentive Spirometer to help you take deep breaths. When coughing or sneezing, hold a pillow firmly against your incision with both hands. This is called "splinting." Doing this helps protect your incision. It also decreases belly discomfort.  If you are being admitted to the  hospital overnight, leave your suitcase in the car. After surgery it may be brought to your room.  In case of increased patient census, it may be necessary for you, the patient, to continue your postoperative care in the Same Day Surgery department.  If you are being discharged the day of surgery, you will not be allowed to drive home. You will need a responsible individual to drive you home and stay with you for 24 hours after surgery.   If you are taking public transportation, you will need to have a responsible individual with you.  Please call the Pre-admissions Testing Dept. at [phone] if you have any questions about these instructions.  Surgery Visitation Policy:  Patients having surgery or a procedure may have two visitors.  Children under the age of 41 must have an adult with them who is not the patient.  Inpatient Visitation:    Visiting hours are 7 a.m. to 8 p.m. Up to four visitors are allowed at one time in a patient room. The visitors may rotate out with other people during the day.  One visitor age 23 or older may stay with the patient overnight and must be in the room by 8 p.m.    Preparing for Surgery with CHLORHEXIDINE GLUCONATE (CHG) Soap  Chlorhexidine Gluconate (CHG) Soap  o An antiseptic cleaner that kills germs and bonds with the skin to continue killing germs even after washing  o Used for showering the night before surgery and morning of surgery  Before surgery, you can play an important role by reducing the number of germs on your skin.  CHG (Chlorhexidine gluconate) soap is an antiseptic cleanser which kills germs and bonds with the skin to continue killing germs even after washing.  Please do not use if you have an allergy to CHG or antibacterial soaps. If your skin becomes reddened/irritated stop using the CHG.  1. Shower the NIGHT BEFORE SURGERY and the MORNING OF SURGERY with CHG soap.  2. If you choose to wash your hair, wash your hair  first as usual with your normal shampoo.  3. After shampooing, rinse your hair and body thoroughly to remove the shampoo.  4. Use CHG as you would any other liquid soap. You can apply CHG directly to the skin and wash gently with a scrungie or a clean washcloth.  5. Apply the CHG soap to your body only from the neck down. Do not use on open wounds or open sores. Avoid contact with your eyes, ears, mouth, and genitals (private parts). Wash face and genitals (private parts) with your normal soap.  6. Wash thoroughly, paying special attention to the area where your surgery will be performed.  7. Thoroughly rinse your body with warm water.  8. Do not shower/wash with your normal soap after using and rinsing off the CHG soap.  9. Pat yourself dry with a clean towel.  10. Wear clean pajamas to bed the night before surgery.  12. Place clean sheets on your bed the night of your first shower and do not sleep with pets.  13. Shower again with the CHG soap on the  day of surgery prior to arriving at the hospital.  14. Do not apply any deodorants/lotions/powders.  15. Please wear clean clothes to the hospital.

## 2023-04-09 ENCOUNTER — Inpatient Hospital Stay: Admission: RE | Admit: 2023-04-09 | Payer: Medicare HMO | Source: Ambulatory Visit

## 2023-04-13 ENCOUNTER — Ambulatory Visit: Admit: 2023-04-13 | Payer: Medicare HMO | Admitting: Orthopedic Surgery

## 2023-04-13 SURGERY — ARTHROSCOPY, KNEE, WITH MEDIAL MENISCECTOMY
Anesthesia: Choice | Site: Knee | Laterality: Left

## 2023-04-21 DIAGNOSIS — R42 Dizziness and giddiness: Secondary | ICD-10-CM | POA: Diagnosis not present

## 2023-04-21 DIAGNOSIS — K8020 Calculus of gallbladder without cholecystitis without obstruction: Secondary | ICD-10-CM | POA: Diagnosis not present

## 2023-04-21 DIAGNOSIS — R112 Nausea with vomiting, unspecified: Secondary | ICD-10-CM | POA: Diagnosis not present

## 2023-04-21 DIAGNOSIS — R1111 Vomiting without nausea: Secondary | ICD-10-CM | POA: Diagnosis not present

## 2023-04-21 DIAGNOSIS — Z20822 Contact with and (suspected) exposure to covid-19: Secondary | ICD-10-CM | POA: Diagnosis not present

## 2023-04-21 DIAGNOSIS — R509 Fever, unspecified: Secondary | ICD-10-CM | POA: Diagnosis not present

## 2023-04-21 DIAGNOSIS — R5383 Other fatigue: Secondary | ICD-10-CM | POA: Diagnosis not present

## 2023-04-21 DIAGNOSIS — R110 Nausea: Secondary | ICD-10-CM | POA: Diagnosis not present

## 2023-04-21 DIAGNOSIS — R6883 Chills (without fever): Secondary | ICD-10-CM | POA: Diagnosis not present

## 2023-04-21 DIAGNOSIS — R197 Diarrhea, unspecified: Secondary | ICD-10-CM | POA: Diagnosis not present

## 2023-04-26 ENCOUNTER — Inpatient Hospital Stay
Admission: RE | Admit: 2023-04-26 | Discharge: 2023-04-26 | Disposition: A | Discharge status: Home / Self Care | Payer: Medicare HMO | Source: Ambulatory Visit

## 2023-04-26 ENCOUNTER — Encounter: Payer: Self-pay | Admitting: Urgent Care

## 2023-04-26 ENCOUNTER — Telehealth: Payer: Self-pay

## 2023-04-26 ENCOUNTER — Other Ambulatory Visit: Payer: Self-pay | Admitting: Orthopedic Surgery

## 2023-04-26 ENCOUNTER — Other Ambulatory Visit: Payer: Self-pay

## 2023-04-26 ENCOUNTER — Emergency Department
Admission: EM | Admit: 2023-04-26 | Discharge: 2023-04-26 | Disposition: A | Discharge status: Home / Self Care | Payer: Medicare HMO | Attending: Emergency Medicine | Admitting: Emergency Medicine

## 2023-04-26 ENCOUNTER — Encounter: Payer: Self-pay | Admitting: Emergency Medicine

## 2023-04-26 DIAGNOSIS — R5383 Other fatigue: Secondary | ICD-10-CM | POA: Diagnosis not present

## 2023-04-26 DIAGNOSIS — R197 Diarrhea, unspecified: Secondary | ICD-10-CM | POA: Diagnosis not present

## 2023-04-26 DIAGNOSIS — R42 Dizziness and giddiness: Secondary | ICD-10-CM | POA: Diagnosis present

## 2023-04-26 DIAGNOSIS — E860 Dehydration: Secondary | ICD-10-CM | POA: Diagnosis not present

## 2023-04-26 DIAGNOSIS — R531 Weakness: Secondary | ICD-10-CM | POA: Diagnosis not present

## 2023-04-26 DIAGNOSIS — N3000 Acute cystitis without hematuria: Secondary | ICD-10-CM | POA: Diagnosis not present

## 2023-04-26 DIAGNOSIS — F32A Depression, unspecified: Secondary | ICD-10-CM | POA: Diagnosis not present

## 2023-04-26 DIAGNOSIS — N390 Urinary tract infection, site not specified: Secondary | ICD-10-CM

## 2023-04-26 DIAGNOSIS — I10 Essential (primary) hypertension: Secondary | ICD-10-CM | POA: Diagnosis not present

## 2023-04-26 DIAGNOSIS — G4489 Other headache syndrome: Secondary | ICD-10-CM | POA: Diagnosis not present

## 2023-04-26 HISTORY — DX: Diverticulosis of large intestine without perforation or abscess without bleeding: K57.30

## 2023-04-26 HISTORY — DX: Malignant neoplasm of endometrium: C54.1

## 2023-04-26 HISTORY — DX: Urinary tract infection, site not specified: N39.0

## 2023-04-26 HISTORY — DX: Drug-induced polyneuropathy: G62.0

## 2023-04-26 HISTORY — DX: Adverse effect of antineoplastic and immunosuppressive drugs, initial encounter: T45.1X5A

## 2023-04-26 HISTORY — DX: Dissociative and conversion disorder, unspecified: F44.9

## 2023-04-26 HISTORY — DX: Dysphasia: R47.02

## 2023-04-26 HISTORY — DX: Neoplasm of unspecified behavior of digestive system: D49.0

## 2023-04-26 HISTORY — DX: Left upper quadrant pain: R10.12

## 2023-04-26 HISTORY — DX: Gastro-esophageal reflux disease with esophagitis, without bleeding: K21.00

## 2023-04-26 LAB — BASIC METABOLIC PANEL
Anion gap: 9 (ref 5–15)
BUN: 9 mg/dL (ref 6–20)
CO2: 25 mmol/L (ref 22–32)
Calcium: 9.2 mg/dL (ref 8.9–10.3)
Chloride: 104 mmol/L (ref 98–111)
Creatinine, Ser: 0.91 mg/dL (ref 0.44–1.00)
GFR, Estimated: >60 mL/min (ref >60)
Glucose, Bld: 108 mg/dL — ABNORMAL HIGH (ref 70–99)
Potassium: 3.6 mmol/L (ref 3.5–5.1)
Sodium: 138 mmol/L (ref 135–145)

## 2023-04-26 LAB — CBC
HCT: 37.8 % (ref 36.0–46.0)
Hemoglobin: 13.2 g/dL (ref 12.0–15.0)
MCH: 30.8 pg (ref 26.0–34.0)
MCHC: 34.9 g/dL (ref 30.0–36.0)
MCV: 88.3 fL (ref 80.0–100.0)
Platelets: 245 10*3/uL (ref 150–400)
RBC: 4.28 MIL/uL (ref 3.87–5.11)
RDW: 12.4 % (ref 11.5–15.5)
WBC: 8.9 10*3/uL (ref 4.0–10.5)
nRBC: 0 % (ref 0.0–0.2)

## 2023-04-26 LAB — HEPATIC FUNCTION PANEL
ALT: 14 U/L (ref 0–44)
AST: 12 U/L — ABNORMAL LOW (ref 15–41)
Albumin: 4.3 g/dL (ref 3.5–5.0)
Alkaline Phosphatase: 43 U/L (ref 38–126)
Bilirubin, Direct: <0.1 mg/dL (ref 0.0–0.2)
Total Bilirubin: 0.9 mg/dL (ref 0.3–1.2)
Total Protein: 7.2 g/dL (ref 6.5–8.1)

## 2023-04-26 LAB — URINALYSIS, ROUTINE W REFLEX MICROSCOPIC
Bilirubin Urine: NEGATIVE
Glucose, UA: NEGATIVE mg/dL
Ketones, ur: NEGATIVE mg/dL
Nitrite: NEGATIVE
Protein, ur: NEGATIVE mg/dL
Specific Gravity, Urine: 1.011 (ref 1.005–1.030)
pH: 6 (ref 5.0–8.0)

## 2023-04-26 LAB — LIPASE, BLOOD: Lipase: 36 U/L (ref 11–51)

## 2023-04-26 MED ORDER — SODIUM CHLORIDE 0.9 % IV BOLUS
1000.0000 mL | Freq: Once | INTRAVENOUS | Status: AC
  Administered 2023-04-26: 1000 mL via INTRAVENOUS

## 2023-04-26 MED ORDER — FOSFOMYCIN TROMETHAMINE 3 G PO PACK
3.0000 g | PACK | ORAL | Status: AC
  Administered 2023-04-26: 3 g via ORAL
  Filled 2023-04-26: qty 3

## 2023-04-26 NOTE — ED Notes (Signed)
Pt not physically in room yet.

## 2023-04-26 NOTE — Discharge Instructions (Addendum)
Please note [facility] is now located at: [address] [place], [place] [address] [phone])   Please return to the emergency room right away if you are to develop a fever, severe nausea, your pain becomes severe or worsens, you are unable to keep food down, begin vomiting any dark or bloody fluid, you develop any dark or bloody stools, feel dehydrated, or other new concerns or symptoms arise.

## 2023-04-26 NOTE — ED Triage Notes (Signed)
Patient to ED via [facility] from home for dizziness. States she has vertigo- hasn't taken her meds due to them "making her feel drunk. Worse when standing.

## 2023-04-26 NOTE — ED Notes (Signed)
EDP Quale tried to go into room to assess pt. Pt still not in room.

## 2023-04-26 NOTE — ED Provider Notes (Signed)
[facility] Provider Note    Event Date/Time   First MD Initiated Contact with Patient 04/26/23 [phone]     (approximate)   History   Dizziness   HPI  Julia Osborne is a 62 y.o. female   reports to me that she is seeing "too many doctors" for feeling fatigued and no appetite.  She is continued with this feeling.  For at least the last several weeks she has had poor appetite no taste to 1 eat anything.  Relates that some of it started around the time of a COVID infection months ago  No pain.  Reports she just feels lightheaded fatigue decreased appetite.  Still drinking fluids but does not want to eat much as far as solid food because she has no appetite or taste for it   No vomiting.  No diarrhea    Patient also relates that she thinks part of what she is suffering through some element of depression.  She reports she has been suffering after 8 years that caused her to be fatigued, she reports she needs to see a psychiatrist.  She is not suicidal but relates that she feels overwhelmed having to care for her animals and dogs at the house on a regular basis.  I did discuss with her and she is agreeable to follow-up at the [facility] that is now located on [address]  Physical Exam   Triage Vital Signs: ED Triage Vitals  Encounter Vitals Group     BP 04/26/23 1322 138/79     Systolic BP Percentile --      Diastolic BP Percentile --      Pulse Rate 04/26/23 1322 69     Resp 04/26/23 1322 14     Temp 04/26/23 1322 99.3 F (37.4 C)     Temp src --      SpO2 04/26/23 1322 100 %     Weight 04/26/23 1322 118 lb (53.5 kg)     Height 04/26/23 1322 5\' 3"  (1.6 m)     Head Circumference --      Peak Flow --      Pain Score 04/26/23 1322 8     Pain Loc --      Pain Education --      Exclude from Growth Chart --     Most recent vital signs: Vitals:   04/26/23 1800 04/26/23 1900  BP: (!) 168/74 (!) 147/74  Pulse: 67 72  Resp: 16  16  Temp:  98.8 F (37.1 C)  SpO2: 100% 100%     General: Awake, no distress.  Moist mucous membranes CV:  Good peripheral perfusion.  Normal tone and rate Resp:  Normal effort.  Clear bilateral Abd:  No distention.  Soft nontender nondistended. Other:     ED Results / Procedures / Treatments   Labs (all labs ordered are listed, but only abnormal results are displayed) Labs Reviewed  BASIC METABOLIC PANEL - Abnormal; Notable for the following components:      Result Value   Glucose, Bld 108 (*)    All other components within normal limits  URINALYSIS, ROUTINE W REFLEX MICROSCOPIC - Abnormal; Notable for the following components:   Color, Urine YELLOW (*)    APPearance CLEAR (*)    Hgb urine dipstick SMALL (*)    Leukocytes,Ua TRACE (*)    Bacteria, UA RARE (*)    All other components within normal limits  HEPATIC FUNCTION PANEL -  Abnormal; Notable for the following components:   AST 12 (*)    All other components within normal limits  CBC  LIPASE, BLOOD   Labs interpreted as normal CBC and metabolic panel.  Urinalysis seems equivocal for potential UTI given her poor appetite fatigue we will treat with single dose fosfomycin.  She has no flank pain no costovertebral angle tenderness bilaterally no skin findings to suggest pyelonephritis  EKG  Entered by me at 1330 heart rate 70 QRS 70 QTc 450 Normal sinus rhythm no evidence of acute ischemia.   RADIOLOGY     PROCEDURES:  Critical Care performed: No  Procedures   MEDICATIONS ORDERED IN ED: Medications  sodium chloride 0.9 % bolus 1,000 mL (0 mLs Intravenous Stopped 04/26/23 1849)  fosfomycin (MONUROL) packet 3 g (3 g Oral Given 04/26/23 2004)     IMPRESSION / MDM / ASSESSMENT AND PLAN / ED COURSE  I reviewed the triage vital signs and the nursing notes.                              Differential diagnosis includes, but is not limited to, dehydration, electrolyte abnormality, depression, fatigue, UTI,  weakness generalized in nature.  No focal neurologic abnormalities moves all extremities well clear speech.  No acute distress.  She relates stress in life feels like she is difficulty keeping up with caring for her home and her dogs.  She is not suicidal.  She is hemodynamically stable abdomen soft nontender nondistended.  Work appears reassuring possibly UTI will treat with single dose fosfomycin.  Discussed careful return precautions with the patient also highly recommended she follow-up with the  [facility] on [address] tomorrow and follow-up with her primary care doctor.  She tells me her primary care doctor has not really listened to her concerns this has been an ongoing process for her, but she is agreeable to follow-up  Patient's presentation is most consistent with acute complicated illness / injury requiring diagnostic workup.         FINAL CLINICAL IMPRESSION(S) / ED DIAGNOSES   Final diagnoses:  Dehydration  Acute cystitis without hematuria  Depression, unspecified depression type     Rx / DC Orders   ED Discharge Orders     None        Note:  This document was prepared using Dragon voice recognition software and may include unintentional dictation errors.   Sharyn Creamer, MD 04/26/23 2009

## 2023-04-26 NOTE — Patient Instructions (Addendum)
Your procedure is scheduled ZO:XWRUEAV August 13 Report to the Registration Desk on the 1st floor of the [facility] To find out your arrival time, please call [phone] between 1PM - 3PM on: Monday August 12 If your arrival time is 6:00 am, do not arrive before that time as the [facility] entrance doors do not open until 6:00 am.  REMEMBER: Instructions that are not followed completely may result in serious medical risk, up to and including death; or upon the discretion of your surgeon and anesthesiologist your surgery may need to be rescheduled.  Do not eat food after midnight the night before surgery.  No gum chewing or hard candies.  You may however, drink CLEAR liquids up to 2 hours before you are scheduled to arrive for your surgery. Do not drink anything within 2 hours of your scheduled arrival time.  Clear liquids include: - water  - apple juice without pulp - gatorade (not RED colors) - black coffee or tea (Do NOT add milk or creamers to the coffee or tea) Do NOT drink anything that is not on this list.  Type 1 and Type 2 diabetics should only drink water.  In addition, your doctor has ordered for you to drink the provided:  Ensure Pre-Surgery Clear Carbohydrate Drink   Drinking this carbohydrate drink up to two hours before surgery helps to reduce insulin resistance and improve patient outcomes. Please complete drinking 2 hours before scheduled arrival time.  One week prior to surgery: Stop Anti-inflammatories (NSAIDS) such as Advil, Aleve, Ibuprofen, Motrin, Naproxen, Naprosyn and Aspirin based products such as Excedrin, Goody's Powder, BC Powder. Stop ANY OVER THE COUNTER supplements until after surgery. You may however, continue to take Tylenol if needed for pain up until the day of surgery.  Continue taking all prescribed medications with the exception of the following:  **Follow guidelines for insulin and diabetes medications**  Follow recommendations from  Cardiologist or PCP regarding stopping blood thinners.  TAKE ONLY THESE MEDICATIONS THE MORNING OF SURGERY WITH A SIP OF WATER:    Antacid (take one the night before and one on the morning of surgery - helps to prevent nausea after surgery.)  Use inhalers on the day of surgery and bring to the hospital.  Fleets enema or bowel prep as directed.  No Alcohol for 24 hours before or after surgery.  No Smoking including e-cigarettes for 24 hours before surgery.  No chewable tobacco products for at least 6 hours before surgery.  No nicotine patches on the day of surgery.  Do not use any "recreational" drugs for at least a week (preferably 2 weeks) before your surgery.  Please be advised that the combination of cocaine and anesthesia may have negative outcomes, up to and including death. If you test positive for cocaine, your surgery will be cancelled.  On the morning of surgery brush your teeth with toothpaste and water, you may rinse your mouth with mouthwash if you wish. Do not swallow any toothpaste or mouthwash.  Use CHG Soap as directed on instruction sheet.  Do not wear jewelry, make-up, hairpins, clips or nail polish.  Do not wear lotions, powders, or perfumes.   Do not shave body hair from the neck down 48 hours before surgery.  Contact lenses, hearing aids and dentures may not be worn into surgery.  Do not bring valuables to the hospital. [facility] is not responsible for any missing/lost belongings or valuables.   Total Shoulder Arthroplasty:  use Benzoyl Peroxide 5%  Gel as directed on instruction sheet.  Bring your C-PAP to the hospital in case you may have to spend the night.   Notify your doctor if there is any change in your medical condition (cold, fever, infection).  Wear comfortable clothing (specific to your surgery type) to the hospital.  After surgery, you can help prevent lung complications by doing breathing exercises.  Take deep breaths and cough every  1-2 hours. Your doctor may order a device called an Incentive Spirometer to help you take deep breaths. When coughing or sneezing, hold a pillow firmly against your incision with both hands. This is called "splinting." Doing this helps protect your incision. It also decreases belly discomfort.   If you are being discharged the day of surgery, you will not be allowed to drive home. You will need a responsible individual to drive you home and stay with you for 24 hours after surgery.   If you are taking public transportation, you will need to have a responsible individual with you.  Please call the Pre-admissions Testing Dept. at [phone] if you have any questions about these instructions.  Surgery Visitation Policy:  Patients having surgery or a procedure may have two visitors.  Children under the age of 5 must have an adult with them who is not the patient.           Preparing for Surgery with CHLORHEXIDINE GLUCONATE (CHG) Soap  Chlorhexidine Gluconate (CHG) Soap  o An antiseptic cleaner that kills germs and bonds with the skin to continue killing germs even after washing  o Used for showering the night before surgery and morning of surgery  Before surgery, you can play an important role by reducing the number of germs on your skin.  CHG (Chlorhexidine gluconate) soap is an antiseptic cleanser which kills germs and bonds with the skin to continue killing germs even after washing.  Please do not use if you have an allergy to CHG or antibacterial soaps. If your skin becomes reddened/irritated stop using the CHG.  1. Shower the NIGHT BEFORE SURGERY and the MORNING OF SURGERY with CHG soap.  2. If you choose to wash your hair, wash your hair first as usual with your normal shampoo.  3. After shampooing, rinse your hair and body thoroughly to remove the shampoo.  4. Use CHG as you would any other liquid soap. You can apply CHG directly to the skin and wash gently with a  scrungie or a clean washcloth.  5. Apply the CHG soap to your body only from the neck down. Do not use on open wounds or open sores. Avoid contact with your eyes, ears, mouth, and genitals (private parts). Wash face and genitals (private parts) with your normal soap.  6. Wash thoroughly, paying special attention to the area where your surgery will be performed.  7. Thoroughly rinse your body with warm water.  8. Do not shower/wash with your normal soap after using and rinsing off the CHG soap.  9. Pat yourself dry with a clean towel.  10. Wear clean pajamas to bed the night before surgery.  12. Place clean sheets on your bed the night of your first shower and do not sleep with pets.  13. Shower again with the CHG soap on the day of surgery prior to arriving at the hospital.  14. Do not apply any deodorants/lotions/powders.  15. Please wear clean clothes to the hospital.

## 2023-04-26 NOTE — ED Triage Notes (Signed)
Ems report: ems from home for weakness since covid. VSS. Cbg 122

## 2023-04-27 ENCOUNTER — Telehealth: Payer: Self-pay

## 2023-04-27 ENCOUNTER — Inpatient Hospital Stay: Payer: Medicare HMO | Attending: Internal Medicine

## 2023-04-27 NOTE — Telephone Encounter (Signed)
Transition Care Management Unsuccessful Follow-up Telephone Call  Date of discharge and from where:  [facility] 7/31  Attempts:  1st Attempt  Reason for unsuccessful TCM follow-up call:  No answer/busy   [facility] Guide, [facility] [phone] [address] [place], [place], [place] [address] Phone: [phone] Email: [email or web address]

## 2023-04-28 ENCOUNTER — Telehealth: Payer: Self-pay

## 2023-04-28 ENCOUNTER — Encounter
Admit: 2023-04-28 | Discharge: 2023-04-28 | Disposition: A | Discharge status: Home / Self Care | Payer: Medicare HMO | Attending: Orthopedic Surgery | Admitting: Orthopedic Surgery

## 2023-04-28 HISTORY — DX: Hyperlipidemia, unspecified: E78.5

## 2023-04-28 NOTE — Telephone Encounter (Signed)
Transition Care Management Unsuccessful Follow-up Telephone Call  Date of discharge and from where:  [facility] 7/31  Attempts:  2nd Attempt  Reason for unsuccessful TCM follow-up call:  No answer/busy   Lenard Forth [facility] Guide, [facility] [phone] [address] [place], [place], [place] [address] Phone: [phone] Email: [email or web address]

## 2023-04-28 NOTE — Patient Instructions (Addendum)
Your procedure is scheduled on:05-04-23 Tuesday Report to the Registration Desk on the 1st floor of the [facility].Then proceed to the 2nd floor Surgery Desk To find out your arrival time, please call [phone] between 1PM - 3PM on:05-03-23 Monday If your arrival time is 6:00 am, do not arrive before that time as the [facility] entrance doors do not open until 6:00 am.  REMEMBER: Instructions that are not followed completely may result in serious medical risk, up to and including death; or upon the discretion of your surgeon and anesthesiologist your surgery may need to be rescheduled.  Do not eat food after midnight the night before surgery.  No gum chewing or hard candies.  You may however, drink CLEAR liquids up to 2 hours before you are scheduled to arrive for your surgery. Do not drink anything within 2 hours of your scheduled arrival time.  Clear liquids include: - water  - apple juice without pulp - gatorade (not RED colors) - black coffee or tea (Do NOT add milk or creamers to the coffee or tea) Do NOT drink anything that is not on this list.  In addition, your doctor has ordered for you to drink the provided:  Ensure Pre-Surgery Clear Carbohydrate Drink  Drinking this carbohydrate drink up to two hours before surgery helps to reduce insulin resistance and improve patient outcomes. Please complete drinking 2 hours before scheduled arrival time.  One week prior to surgery:Last dose 04-28-23 Stop Anti-inflammatories (NSAIDS) such asmeloxicam (MOBIC),  Advil, Aleve, Ibuprofen, Motrin, Naproxen, Naprosyn and Aspirin based products such as Excedrin, Goody's Powder, BC Powder.You may however, take Tylenol if needed for pain up until the day of surgery. Stop ANY OVER THE COUNTER supplements/vitamins NOW (04-28-23) until after surgery.   Continue taking all prescribed medications   Do NOT take any medication the day of surgery  Use your Albuterol Nebulizer the morning of surgery  and bring your Albuterol Inhaler to the hospital  No Alcohol for 24 hours before or after surgery.  No Smoking including e-cigarettes for 24 hours before surgery.  No chewable tobacco products for at least 6 hours before surgery.  No nicotine patches on the day of surgery.  Do not use any "recreational" drugs for at least a week (preferably 2 weeks) before your surgery.  Please be advised that the combination of cocaine and anesthesia may have negative outcomes, up to and including death. If you test positive for cocaine, your surgery will be cancelled.  On the morning of surgery brush your teeth with toothpaste and water, you may rinse your mouth with mouthwash if you wish. Do not swallow any toothpaste or mouthwash.  Use CHG Soap as directed on instruction sheet.  Do not wear jewelry, make-up, hairpins, clips or nail polish.  Do not wear lotions, powders, or perfumes.   Do not shave body hair from the neck down 48 hours before surgery.  Contact lenses, hearing aids and dentures may not be worn into surgery.  Do not bring valuables to the hospital. [facility] is not responsible for any missing/lost belongings or valuables.   Notify your doctor if there is any change in your medical condition (cold, fever, infection).  Wear comfortable clothing (specific to your surgery type) to the hospital.  After surgery, you can help prevent lung complications by doing breathing exercises.  Take deep breaths and cough every 1-2 hours. Your doctor may order a device called an Incentive Spirometer to help you take deep breaths. When coughing  or sneezing, hold a pillow firmly against your incision with both hands. This is called "splinting." Doing this helps protect your incision. It also decreases belly discomfort.  If you are being admitted to the hospital overnight, leave your suitcase in the car. After surgery it may be brought to your room.  In case of increased patient census, it may be  necessary for you, the patient, to continue your postoperative care in the Same Day Surgery department.  If you are being discharged the day of surgery, you will not be allowed to drive home. You will need a responsible individual to drive you home and stay with you for 24 hours after surgery.   If you are taking public transportation, you will need to have a responsible individual with you.  Please call the Pre-admissions Testing Dept. at [phone] if you have any questions about these instructions.  Surgery Visitation Policy:  Patients having surgery or a procedure may have two visitors.  Children under the age of 17 must have an adult with them who is not the patient.     Preparing for Surgery with CHLORHEXIDINE GLUCONATE (CHG) Soap  Chlorhexidine Gluconate (CHG) Soap  o An antiseptic cleaner that kills germs and bonds with the skin to continue killing germs even after washing  o Used for showering the night before surgery and morning of surgery  Before surgery, you can play an important role by reducing the number of germs on your skin.  CHG (Chlorhexidine gluconate) soap is an antiseptic cleanser which kills germs and bonds with the skin to continue killing germs even after washing.  Please do not use if you have an allergy to CHG or antibacterial soaps. If your skin becomes reddened/irritated stop using the CHG.  1. Shower the NIGHT BEFORE SURGERY and the MORNING OF SURGERY with CHG soap.  2. If you choose to wash your hair, wash your hair first as usual with your normal shampoo.  3. After shampooing, rinse your hair and body thoroughly to remove the shampoo.  4. Use CHG as you would any other liquid soap. You can apply CHG directly to the skin and wash gently with a scrungie or a clean washcloth.  5. Apply the CHG soap to your body only from the neck down. Do not use on open wounds or open sores. Avoid contact with your eyes, ears, mouth, and genitals (private  parts). Wash face and genitals (private parts) with your normal soap.  6. Wash thoroughly, paying special attention to the area where your surgery will be performed.  7. Thoroughly rinse your body with warm water.  8. Do not shower/wash with your normal soap after using and rinsing off the CHG soap.  9. Pat yourself dry with a clean towel.  10. Wear clean pajamas to bed the night before surgery.  12. Place clean sheets on your bed the night of your first shower and do not sleep with pets.  13. Shower again with the CHG soap on the day of surgery prior to arriving at the hospital.  14. Do not apply any deodorants/lotions/powders.  15. Please wear clean clothes to the hospital.  How to Use an Incentive Spirometer An incentive spirometer is a tool that measures how well you are filling your lungs with each breath. Learning to take long, deep breaths using this tool can help you keep your lungs clear and active. This may help to reverse or lessen your chance of developing breathing (pulmonary) problems, especially infection. You  may be asked to use a spirometer: After a surgery. If you have a lung problem or a history of smoking. After a long period of time when you have been unable to move or be active. If the spirometer includes an indicator to show the highest number that you have reached, your health care provider or respiratory therapist will help you set a goal. Keep a log of your progress as told by your health care provider. What are the risks? Breathing too quickly may cause dizziness or cause you to pass out. Take your time so you do not get dizzy or light-headed. If you are in pain, you may need to take pain medicine before doing incentive spirometry. It is harder to take a deep breath if you are having pain. How to use your incentive spirometer  Sit up on the edge of your bed or on a chair. Hold the incentive spirometer so that it is in an upright position. Before you use the  spirometer, breathe out normally. Place the mouthpiece in your mouth. Make sure your lips are closed tightly around it. Breathe in slowly and as deeply as you can through your mouth, causing the piston or the ball to rise toward the top of the chamber. Hold your breath for 3-5 seconds, or for as long as possible. If the spirometer includes a coach indicator, use this to guide you in breathing. Slow down your breathing if the indicator goes above the marked areas. Remove the mouthpiece from your mouth and breathe out normally. The piston or ball will return to the bottom of the chamber. Rest for a few seconds, then repeat the steps 10 or more times. Take your time and take a few normal breaths between deep breaths so that you do not get dizzy or light-headed. Do this every 1-2 hours when you are awake. If the spirometer includes a goal marker to show the highest number you have reached (best effort), use this as a goal to work toward during each repetition. After each set of 10 deep breaths, cough a few times. This will help to make sure that your lungs are clear. If you have an incision on your chest or abdomen from surgery, place a pillow or a rolled-up towel firmly against the incision when you cough. This can help to reduce pain while taking deep breaths and coughing. General tips When you are able to get out of bed: Walk around often. Continue to take deep breaths and cough in order to clear your lungs. Keep using the incentive spirometer until your health care provider says it is okay to stop using it. If you have been in the hospital, you may be told to keep using the spirometer at home. Contact a health care provider if: You are having difficulty using the spirometer. You have trouble using the spirometer as often as instructed. Your pain medicine is not giving enough relief for you to use the spirometer as told. You have a fever. Get help right away if: You develop shortness of  breath. You develop a cough with bloody mucus from the lungs. You have fluid or blood coming from an incision site after you cough. Summary An incentive spirometer is a tool that can help you learn to take long, deep breaths to keep your lungs clear and active. You may be asked to use a spirometer after a surgery, if you have a lung problem or a history of smoking, or if you have been inactive for a  long period of time. Use your incentive spirometer as instructed every 1-2 hours while you are awake. If you have an incision on your chest or abdomen, place a pillow or a rolled-up towel firmly against your incision when you cough. This will help to reduce pain. Get help right away if you have shortness of breath, you cough up bloody mucus, or blood comes from your incision when you cough. This information is not intended to replace advice given to you by your health care provider. Make sure you discuss any questions you have with your health care provider. Document Revised: 11/27/2019 Document Reviewed: 11/27/2019 Elsevier Patient Education  2024 [employer].

## 2023-04-30 DIAGNOSIS — Z87440 Personal history of urinary (tract) infections: Secondary | ICD-10-CM | POA: Diagnosis not present

## 2023-04-30 DIAGNOSIS — E860 Dehydration: Secondary | ICD-10-CM | POA: Diagnosis not present

## 2023-04-30 DIAGNOSIS — C189 Malignant neoplasm of colon, unspecified: Secondary | ICD-10-CM | POA: Diagnosis not present

## 2023-04-30 DIAGNOSIS — R42 Dizziness and giddiness: Secondary | ICD-10-CM | POA: Diagnosis not present

## 2023-04-30 DIAGNOSIS — I959 Hypotension, unspecified: Secondary | ICD-10-CM | POA: Diagnosis not present

## 2023-04-30 DIAGNOSIS — I10 Essential (primary) hypertension: Secondary | ICD-10-CM | POA: Diagnosis not present

## 2023-04-30 DIAGNOSIS — N390 Urinary tract infection, site not specified: Secondary | ICD-10-CM | POA: Diagnosis not present

## 2023-04-30 DIAGNOSIS — F17210 Nicotine dependence, cigarettes, uncomplicated: Secondary | ICD-10-CM | POA: Diagnosis not present

## 2023-04-30 DIAGNOSIS — R531 Weakness: Secondary | ICD-10-CM | POA: Diagnosis not present

## 2023-04-30 DIAGNOSIS — R300 Dysuria: Secondary | ICD-10-CM | POA: Diagnosis not present

## 2023-05-02 ENCOUNTER — Emergency Department (HOSPITAL_COMMUNITY): Payer: Medicare HMO

## 2023-05-02 ENCOUNTER — Encounter (HOSPITAL_COMMUNITY): Payer: Self-pay | Admitting: Internal Medicine

## 2023-05-02 ENCOUNTER — Observation Stay (HOSPITAL_COMMUNITY)
Admission: EM | Admit: 2023-05-02 | Discharge: 2023-05-04 | Disposition: A | Discharge status: Other Acute Inpatient Hospital | Payer: Medicare HMO | Attending: Internal Medicine | Admitting: Internal Medicine

## 2023-05-02 ENCOUNTER — Other Ambulatory Visit: Payer: Self-pay

## 2023-05-02 DIAGNOSIS — R4182 Altered mental status, unspecified: Secondary | ICD-10-CM

## 2023-05-02 DIAGNOSIS — R410 Disorientation, unspecified: Secondary | ICD-10-CM | POA: Diagnosis not present

## 2023-05-02 DIAGNOSIS — R296 Repeated falls: Secondary | ICD-10-CM | POA: Diagnosis not present

## 2023-05-02 DIAGNOSIS — F17200 Nicotine dependence, unspecified, uncomplicated: Secondary | ICD-10-CM | POA: Insufficient documentation

## 2023-05-02 DIAGNOSIS — Z85038 Personal history of other malignant neoplasm of large intestine: Secondary | ICD-10-CM | POA: Insufficient documentation

## 2023-05-02 DIAGNOSIS — W19XXXA Unspecified fall, initial encounter: Secondary | ICD-10-CM | POA: Diagnosis not present

## 2023-05-02 DIAGNOSIS — Z79899 Other long term (current) drug therapy: Secondary | ICD-10-CM | POA: Insufficient documentation

## 2023-05-02 DIAGNOSIS — R471 Dysarthria and anarthria: Secondary | ICD-10-CM | POA: Diagnosis not present

## 2023-05-02 DIAGNOSIS — Z1152 Encounter for screening for COVID-19: Secondary | ICD-10-CM | POA: Insufficient documentation

## 2023-05-02 DIAGNOSIS — Z8542 Personal history of malignant neoplasm of other parts of uterus: Secondary | ICD-10-CM | POA: Insufficient documentation

## 2023-05-02 DIAGNOSIS — J449 Chronic obstructive pulmonary disease, unspecified: Secondary | ICD-10-CM | POA: Insufficient documentation

## 2023-05-02 DIAGNOSIS — F32A Depression, unspecified: Secondary | ICD-10-CM

## 2023-05-02 DIAGNOSIS — I10 Essential (primary) hypertension: Secondary | ICD-10-CM | POA: Insufficient documentation

## 2023-05-02 DIAGNOSIS — R569 Unspecified convulsions: Secondary | ICD-10-CM | POA: Diagnosis not present

## 2023-05-02 DIAGNOSIS — G459 Transient cerebral ischemic attack, unspecified: Secondary | ICD-10-CM

## 2023-05-02 DIAGNOSIS — G4489 Other headache syndrome: Secondary | ICD-10-CM | POA: Diagnosis not present

## 2023-05-02 DIAGNOSIS — Z0389 Encounter for observation for other suspected diseases and conditions ruled out: Secondary | ICD-10-CM | POA: Diagnosis not present

## 2023-05-02 DIAGNOSIS — Q283 Other malformations of cerebral vessels: Secondary | ICD-10-CM | POA: Diagnosis not present

## 2023-05-02 DIAGNOSIS — E538 Deficiency of other specified B group vitamins: Secondary | ICD-10-CM | POA: Insufficient documentation

## 2023-05-02 DIAGNOSIS — R29810 Facial weakness: Secondary | ICD-10-CM | POA: Diagnosis not present

## 2023-05-02 DIAGNOSIS — R4781 Slurred speech: Secondary | ICD-10-CM | POA: Diagnosis not present

## 2023-05-02 DIAGNOSIS — R9431 Abnormal electrocardiogram [ECG] [EKG]: Secondary | ICD-10-CM | POA: Diagnosis not present

## 2023-05-02 LAB — I-STAT CHEM 8, ED
BUN: 4 mg/dL — ABNORMAL LOW (ref 6–20)
Calcium, Ion: 1.06 mmol/L — ABNORMAL LOW (ref 1.15–1.40)
Chloride: 103 mmol/L (ref 98–111)
Creatinine, Ser: 0.7 mg/dL (ref 0.44–1.00)
Glucose, Bld: 114 mg/dL — ABNORMAL HIGH (ref 70–99)
HCT: 37 % (ref 36.0–46.0)
Hemoglobin: 12.6 g/dL (ref 12.0–15.0)
Potassium: 3.2 mmol/L — ABNORMAL LOW (ref 3.5–5.1)
Sodium: 141 mmol/L (ref 135–145)
TCO2: 25 mmol/L (ref 22–32)

## 2023-05-02 LAB — COMPREHENSIVE METABOLIC PANEL
ALT: 8 U/L (ref 0–44)
AST: 13 U/L — ABNORMAL LOW (ref 15–41)
Albumin: 3.7 g/dL (ref 3.5–5.0)
Alkaline Phosphatase: 40 U/L (ref 38–126)
Anion gap: 9 (ref 5–15)
BUN: 6 mg/dL (ref 6–20)
CO2: 26 mmol/L (ref 22–32)
Calcium: 8.9 mg/dL (ref 8.9–10.3)
Chloride: 105 mmol/L (ref 98–111)
Creatinine, Ser: 0.78 mg/dL (ref 0.44–1.00)
GFR, Estimated: >60 mL/min (ref >60)
Glucose, Bld: 115 mg/dL — ABNORMAL HIGH (ref 70–99)
Potassium: 3.3 mmol/L — ABNORMAL LOW (ref 3.5–5.1)
Sodium: 140 mmol/L (ref 135–145)
Total Bilirubin: 0.7 mg/dL (ref 0.3–1.2)
Total Protein: 6.1 g/dL — ABNORMAL LOW (ref 6.5–8.1)

## 2023-05-02 LAB — URINALYSIS, ROUTINE W REFLEX MICROSCOPIC
Bacteria, UA: NONE SEEN
Bilirubin Urine: NEGATIVE
Glucose, UA: NEGATIVE mg/dL
Ketones, ur: NEGATIVE mg/dL
Leukocytes,Ua: NEGATIVE
Nitrite: NEGATIVE
Protein, ur: NEGATIVE mg/dL
Specific Gravity, Urine: 1.016 (ref 1.005–1.030)
pH: 7 (ref 5.0–8.0)

## 2023-05-02 LAB — DIFFERENTIAL
Abs Immature Granulocytes: 0.03 10*3/uL (ref 0.00–0.07)
Basophils Absolute: 0 10*3/uL (ref 0.0–0.1)
Basophils Relative: 1 %
Eosinophils Absolute: 0 10*3/uL (ref 0.0–0.5)
Eosinophils Relative: 0 %
Immature Granulocytes: 0 %
Lymphocytes Relative: 21 %
Lymphs Abs: 1.6 10*3/uL (ref 0.7–4.0)
Monocytes Absolute: 0.6 10*3/uL (ref 0.1–1.0)
Monocytes Relative: 7 %
Neutro Abs: 5.6 10*3/uL (ref 1.7–7.7)
Neutrophils Relative %: 71 %

## 2023-05-02 LAB — TSH: TSH: 1.194 u[IU]/mL (ref 0.350–4.500)

## 2023-05-02 LAB — APTT: aPTT: 25 seconds (ref 24–36)

## 2023-05-02 LAB — PROTIME-INR
INR: 1 (ref 0.8–1.2)
Prothrombin Time: 13.3 seconds (ref 11.4–15.2)

## 2023-05-02 LAB — RAPID URINE DRUG SCREEN, HOSP PERFORMED
Amphetamines: NOT DETECTED
Barbiturates: NOT DETECTED
Benzodiazepines: NOT DETECTED
Cocaine: NOT DETECTED
Opiates: NOT DETECTED
Tetrahydrocannabinol: NOT DETECTED

## 2023-05-02 LAB — CBC
HCT: 37.8 % (ref 36.0–46.0)
Hemoglobin: 12.6 g/dL (ref 12.0–15.0)
MCH: 31 pg (ref 26.0–34.0)
MCHC: 33.3 g/dL (ref 30.0–36.0)
MCV: 92.9 fL (ref 80.0–100.0)
Platelets: 203 10*3/uL (ref 150–400)
RBC: 4.07 MIL/uL (ref 3.87–5.11)
RDW: 12.7 % (ref 11.5–15.5)
WBC: 7.9 10*3/uL (ref 4.0–10.5)
nRBC: 0 % (ref 0.0–0.2)

## 2023-05-02 LAB — ETHANOL: Alcohol, Ethyl (B): <10 mg/dL (ref <10)

## 2023-05-02 LAB — CBG MONITORING, ED: Glucose-Capillary: 117 mg/dL — ABNORMAL HIGH (ref 70–99)

## 2023-05-02 LAB — SALICYLATE LEVEL: Salicylate Lvl: <7 mg/dL — ABNORMAL LOW (ref 7.0–30.0)

## 2023-05-02 LAB — ACETAMINOPHEN LEVEL: Acetaminophen (Tylenol), Serum: <10 ug/mL — ABNORMAL LOW (ref 10–30)

## 2023-05-02 LAB — VITAMIN B12: Vitamin B-12: 177 pg/mL — ABNORMAL LOW (ref 180–914)

## 2023-05-02 LAB — HCG, SERUM, QUALITATIVE: Preg, Serum: NEGATIVE

## 2023-05-02 LAB — AMMONIA: Ammonia: 19 umol/L (ref 9–35)

## 2023-05-02 LAB — HIV ANTIBODY (ROUTINE TESTING W REFLEX): HIV Screen 4th Generation wRfx: NONREACTIVE

## 2023-05-02 MED ORDER — CYANOCOBALAMIN 1000 MCG/ML IJ SOLN
1000.0000 ug | Freq: Once | INTRAMUSCULAR | Status: AC
  Administered 2023-05-02: 1000 ug via INTRAMUSCULAR
  Filled 2023-05-02: qty 1

## 2023-05-02 MED ORDER — ENSURE ENLIVE PO LIQD
237.0000 mL | Freq: Two times a day (BID) | ORAL | Status: DC
  Administered 2023-05-03 – 2023-05-04 (×3): 237 mL via ORAL

## 2023-05-02 MED ORDER — ALBUTEROL SULFATE (2.5 MG/3ML) 0.083% IN NEBU: 2.5000 mg | INHALATION_SOLUTION | RESPIRATORY_TRACT | Status: DC | PRN

## 2023-05-02 MED ORDER — IOHEXOL 350 MG/ML SOLN
75.0000 mL | Freq: Once | INTRAVENOUS | Status: AC | PRN
  Administered 2023-05-02: 75 mL via INTRAVENOUS

## 2023-05-02 MED ORDER — GABAPENTIN 300 MG PO CAPS: 300.0000 mg | ORAL_CAPSULE | Freq: Every day | ORAL | Status: DC

## 2023-05-02 MED ORDER — POTASSIUM CHLORIDE CRYS ER 20 MEQ PO TBCR
40.0000 meq | EXTENDED_RELEASE_TABLET | Freq: Once | ORAL | Status: AC
  Administered 2023-05-02: 40 meq via ORAL
  Filled 2023-05-02: qty 2

## 2023-05-02 MED ORDER — ACETAMINOPHEN 650 MG RE SUPP: 650.0000 mg | Freq: Four times a day (QID) | RECTAL | Status: DC | PRN

## 2023-05-02 MED ORDER — THIAMINE HCL 100 MG/ML IJ SOLN
500.0000 mg | Freq: Three times a day (TID) | INTRAVENOUS | Status: DC
  Administered 2023-05-02 – 2023-05-04 (×5): 500 mg via INTRAVENOUS
  Filled 2023-05-02 (×8): qty 5

## 2023-05-02 MED ORDER — GADOBUTROL 1 MMOL/ML IV SOLN
5.0000 mL | Freq: Once | INTRAVENOUS | Status: AC | PRN
  Administered 2023-05-02: 5 mL via INTRAVENOUS

## 2023-05-02 MED ORDER — ACETAMINOPHEN 325 MG PO TABS
650.0000 mg | ORAL_TABLET | Freq: Four times a day (QID) | ORAL | Status: DC | PRN
  Administered 2023-05-02: 650 mg via ORAL
  Filled 2023-05-02: qty 2

## 2023-05-02 MED ORDER — TRAZODONE HCL 50 MG PO TABS
50.0000 mg | ORAL_TABLET | Freq: Every day | ORAL | Status: DC
  Administered 2023-05-02 – 2023-05-03 (×2): 50 mg via ORAL
  Filled 2023-05-02 (×2): qty 1

## 2023-05-02 MED ORDER — ESCITALOPRAM OXALATE 10 MG PO TABS
20.0000 mg | ORAL_TABLET | Freq: Every day | ORAL | Status: DC
  Administered 2023-05-02 – 2023-05-04 (×2): 20 mg via ORAL
  Filled 2023-05-02 (×3): qty 2

## 2023-05-02 MED ORDER — SENNOSIDES-DOCUSATE SODIUM 8.6-50 MG PO TABS: 1.0000 | ORAL_TABLET | Freq: Every evening | ORAL | Status: DC | PRN

## 2023-05-02 MED ORDER — MIDAZOLAM HCL 2 MG/2ML IJ SOLN: 2.0000 mg | INTRAMUSCULAR | Status: DC | PRN

## 2023-05-02 MED ORDER — ENOXAPARIN SODIUM 40 MG/0.4ML IJ SOSY
40.0000 mg | PREFILLED_SYRINGE | INTRAMUSCULAR | Status: DC
  Administered 2023-05-02 – 2023-05-03 (×2): 40 mg via SUBCUTANEOUS
  Filled 2023-05-02 (×3): qty 0.4

## 2023-05-02 MED ORDER — ALBUTEROL SULFATE HFA 108 (90 BASE) MCG/ACT IN AERS: 1.0000 | INHALATION_SPRAY | RESPIRATORY_TRACT | Status: DC | PRN

## 2023-05-02 NOTE — Hospital Course (Addendum)
Fell 4 times today, fell back against wall in bathroom - he was not in the room when this happened  She has been having falls for the last few weeks, speech has been non-sensical  Has had poor appetite and does not want to eat  8/12:

## 2023-05-02 NOTE — Progress Notes (Signed)
EEG complete - results pending.  GMD/NA 

## 2023-05-02 NOTE — ED Triage Notes (Addendum)
Report given at bridge: PT was BIB by [facility] with a LKW OF 0900am, Left sided weakness and elevated BP.

## 2023-05-02 NOTE — ED Notes (Signed)
Patient transported to MRI 

## 2023-05-02 NOTE — Consult Note (Signed)
Neurology Consultation  Reason for Consult: Code Stroke  CC: Code Stroke, AMS  History is obtained from: EMS personnel  HPI: Julia Osborne is a 62 y.o. female with a past medical history of anxiety, COPD, chronic pain, colon cancer, glaucoma, ovarian/uterine cancer, and vertigo presenting with altered mental status. She was in her usual state of health when she went to bed last night at 2100. Her son found her this morning confused. She fell four times this morning and her son then called EMS.  Her son tells me that this morning she was talking in gibberish and had multiple falls.  He has noticed that about 2 to 3 weeks ago she started having falls and stopped eating.  He states she has been under a lot of stress lately because she has 4 huskies that require a lot of care. [name] also tells her nurse that she is having a mental breakdown and wants to go to the mental hospital.  She additionally states that the dogs knock her over sometimes and eat her food.   Of note, she had a similar presentation 09/11/2014 with inability to speak and confusion that was intermittent in nature.  At the time she had been recently diagnosed with endometrial cancer and had her hysterectomy completed but was awaiting Port-A-Cath placement for chemotherapy.  There was significant functional overlay on examination including retained ability to write despite having difficulty speaking, effort dependent strength exam.  EEG and MRI were normal  LKW: 2100 05/02/2023 TNK given?: No, outside of the window Mechanical Thrombectomy? No, no lvo Premorbid modified Rankin scale (mRS): 0 0-Completely asymptomatic and back to baseline post-stroke   ROS: Unable to obtain due to altered mental status.   No past medical history on file. PER EMS LIST Anxiety COPD Chronic pain Colon cancer Glaucoma Ovarian/Uterine cancer Vertigo  No family history on file.  Social History:   has no history on file for tobacco use,  alcohol use, and drug use.  Medications  Current Facility-Administered Medications:    thiamine (VITAMIN B1) 500 mg in sodium chloride 0.9 % 50 mL IVPB, 500 mg, Intravenous, Q8H, ,  L, MD No current outpatient medications on file.  PER EMS LIST Albuterol Alprazolam Citalopram DouNeb Meclizine Montelukast Trazodone Zofran  Exam: Current vital signs: BP (!) 176/73 (BP Location: Left Arm)   Pulse 75   Temp 98 F (36.7 C)   Resp 18   SpO2 100%  Vital signs in last 24 hours: Temp:  [98 F (36.7 C)] 98 F (36.7 C) (08/11 1041) Pulse Rate:  [75] 75 (08/11 1041) Resp:  [18] 18 (08/11 1041) BP: (164-176)/(73-88) 176/73 (08/11 1041) SpO2:  [100 %] 100 % (08/11 1041)  GENERAL: Awake, alert in NAD HEENT: - Normocephalic and atraumatic, dry mm, no LN++, no Thyromegally LUNGS - Clear to auscultation bilaterally with no wheezes CV - S1S2 RRR, no m/r/g, equal pulses bilaterally. ABDOMEN - Soft, nontender, nondistended with normoactive BS Ext: warm, well perfused, intact peripheral pulses, no edema  NEURO:  Mental Status: alert, inconsistently following commands Language: speech is sparse, seems dysarthric, not fluent Cranial Nerves: PERRL 3 mm/brisk. EOMI, visual fields full, no facial asymmetry, facial sensation intact, hearing intact, tongue/uvula/soft palate midline, normal sternocleidomastoid and trapezius muscle strength. No evidence of tongue atrophy or fasciculations Motor: Elevates all extremities antigravity with encouragement  Bilateral lower extremities with drift  Tone: is normal and bulk is normal Sensation- Intact to light touch bilaterally Coordination: FTN intact bilaterally, no ataxia in BLE.  Gait- deferred  NIHSS 1a Level of Conscious.: 1 1b LOC Questions: 2 1c LOC Commands: 0 2 Best Gaze: 0 3 Visual: 0 4 Facial Palsy: 0 5a Motor Arm - left: 0 5b Motor Arm - Right: 0 6a Motor Leg - Left: 2 6b Motor Leg - Right: 2 7 Limb Ataxia: 0 8  Sensory: 0 9 Best Language: 1 10 Dysarthria: 1 11 Extinct. and Inatten.: 0 TOTAL: 9   Labs I have reviewed labs in epic and the results pertinent to this consultation are:   CBC    Component Value Date/Time   WBC 7.9 05/02/2023 1019   RBC 4.07 05/02/2023 1019   HGB 12.6 05/02/2023 1028   HCT 37.0 05/02/2023 1028   PLT 203 05/02/2023 1019   MCV 92.9 05/02/2023 1019   MCH 31.0 05/02/2023 1019   MCHC 33.3 05/02/2023 1019   RDW 12.7 05/02/2023 1019   LYMPHSABS 1.6 05/02/2023 1019   MONOABS 0.6 05/02/2023 1019   EOSABS 0.0 05/02/2023 1019   BASOSABS 0.0 05/02/2023 1019    CMP     Component Value Date/Time   NA 141 05/02/2023 1028   K 3.2 (L) 05/02/2023 1028   CL 103 05/02/2023 1028   CO2 26 05/02/2023 1019   GLUCOSE 114 (H) 05/02/2023 1028   BUN 4 (L) 05/02/2023 1028   CREATININE 0.70 05/02/2023 1028   CALCIUM 8.9 05/02/2023 1019   PROT 6.1 (L) 05/02/2023 1019   ALBUMIN 3.7 05/02/2023 1019   AST 13 (L) 05/02/2023 1019   ALT 8 05/02/2023 1019   ALKPHOS 40 05/02/2023 1019   BILITOT 0.7 05/02/2023 1019   GFRNONAA >60 05/02/2023 1019    Lipid Panel  No results found for: "CHOL", "TRIG", "HDL", "CHOLHDL", "VLDL", "LDLCALC", "LDLDIRECT"   Imaging I have reviewed the images obtained:  CT-head - Stable and normal for age noncontrast CT appearance of the brain. ASPECTS 10.   CT Angio Head and Neck -  Negative for large vessel occlusion. Stable CTA Head and Neck with no significant atherosclerosis or stenosis identified.  Assessment:  62 y.o. female  past medical history of anxiety, COPD, chronic pain, colon cancer, glaucoma, ovarian/uterine cancer (2015), and vertigo presenting with altered mental status. She was in her usual state of health when she went to bed last night at 2100. Her son found her this morning confused. She fell four times this morning and her son then called EMS.  Her son tells me that this morning she was talking in gibberish and had multiple falls.   He has noticed that about 2 to 3 weeks ago she started having falls, been highly anxious, and stopped eating. Given her cancer history we will order an MRI brain w wo as well as delirium and nutritional labs.   Recommendations: - MRI of the brain with and without contrast - Admit for stroke work up if positive - Nutritional labs and reversible causes of delirium work up ordered  - TSH, ammonia, RPR, HIV, B1, B12, UA, UDS, appreciate PCP / primary team following up these results and treating as needed (please note goal B12 greater than 400) - routine EEG - High dose thiamine 500mg  q8hrs x 3 days, ordered to start at 8 PM to allow labs to be collected first.  Thiamine level will take multiple days to result, if it does result deficient, please transition to oral thiamine 100 mg daily after high-dose IV course completed - Consider psychiatry consult - Neurology will follow-up EEG and MRI brain.  If these are reassuring neurology will sign off.  Please do not hesitate to reach out if neurologic questions or concerns arise.  Patient seen and examined by NP/APP with MD. MD to update note as needed.   Elmer Picker, DNP, FNP-BC [facility] Pager: [phone]  Attending Neurologist's note:  I personally saw this patient, gathering history, performing a full neurologic examination, reviewing relevant labs, personally reviewing relevant imaging including head CT, CTA, and formulated the assessment and plan, adding the note above for completeness and clarity to accurately reflect my thoughts  CRITICAL CARE Performed by: Gordy Councilman   Total critical care time: 35 minutes  Critical care time was exclusive of separately billable procedures and treating other patients.  Critical care was necessary to treat or prevent imminent or life-threatening deterioration -- emergent evaluation for consideration of thrombolytic or thrombectomy administration  Critical care was time spent  personally by me on the following activities: development of treatment plan with patient and/or surrogate as well as nursing, discussions with consultants, evaluation of patient's response to treatment, examination of patient, obtaining history from patient or surrogate, ordering and performing treatments and interventions, ordering and review of laboratory studies, ordering and review of radiographic studies, pulse oximetry and re-evaluation of patient's condition.

## 2023-05-02 NOTE — Progress Notes (Signed)
DrArlean Hopping was made aware that SBP during day=134-176. Now SBP=96-98. HR in 60s.  Pt c/o dizzy. Has heartburn. LBM=8/8. no N/V & abd pain +gas.

## 2023-05-02 NOTE — H&P (Signed)
Date: 05/02/2023               Patient Name:  Julia Osborne MRN: [id number]  DOB: 06/23/1961 Age / Sex: 62 y.o., female   PCP: Ailene Ravel, MD         Medical Service: Internal Medicine Teaching Service         Attending Physician: Dr. Earl Lagos, MD      First Contact: Dr. Jeral Pinch, DO Pager [phone]    Second Contact: Dr. Elza Rafter, DO Pager [phone]         After Hours (After 5p/  First Contact Pager: [phone]  weekends / holidays): Second Contact Pager: [phone]   SUBJECTIVE   Chief Complaint: AMS, dysarthria  History of Present Illness: Koriann Gerrity is a 62 yo female with HTN, anxiety, COPD, chronic pain, colon/endometrial cancer, glaucoma who presented with altered mental status and speech changes and came in as a code stroke.   Patient reports ocular pain for the past couple weeks. She reports blurry vision and floaters this morning and falls. She reports ongoing headaches and generalized body aches and weakness. Denies hitting her head. Patient reports numbness and tingling of her left hand. Patient reports cramping abdominal pain and last bowel movement 4 days ago. Patient denies any nausea or vomiting. She reports dysuria and increase in frequency. She also reports decrease in appetite. She reports some days she does not eat anything.    Patient reports feeling down lately. States she is feeling overwhelmed and depressed saying, "I just want to quit". Patient reports thoughts of hurting herself, but no plan. She is currently not having these thoughts.   Patient reports symptoms of cough and fevers for the last couple of weeks. Reports son at home is sick recently.   Patient's son is contacted and the following history is taken:  Patient's son is primarily concerned about her repeated falls over the course of three weeks with associated symptoms of body aches, abdominal pain, blurry vision, weakness, and fatigue. He noted her speech was nonsensical this  morning. He could not understand her words. He reports decrease in appetite, stating that she is not eating food and drinking water. She only wants mountain dew. She has been more confused lately, forgetting things. Son reports that she is not sitting still at home, because she has severe OCD and is constantly worried about cleaning and other things. He also reports that the patient has been down and depressed lately, though patient has not voiced any thoughts of hurting herself. He denies any prior history of stroke.   ED Course: Code stroke was called. MRI brain and EEG pending.    Meds:  Albuterol Trazodone 50 mg qhs Escitalopram (lexipro) 120 mg  Gabapentin 300 at bedtime - not taking Montelukast 10 mg  Meclizine 25 mg daily  Ciprofloxacin 7/31. Bottle is still full.  Lumigan Eye drops   Past Medical History Anxiety Depression OCD COPD Endometrial-colorectal Cancer Glaucoma Vertigo  Social:  Lives at home, son stays with her Support: Son Level of Function: independent of ADLs; iADLs PCP: Hamrick, Maura L, MD Substances: 0.5 pack/day x 30 years, no alcohol or substance use  Family History:  Unable to obtain   Allergies: Allergies as of 05/02/2023   (Not on File)    Review of Systems: A complete ROS was negative except as per HPI.   OBJECTIVE:   Physical Exam: Blood pressure (!) 176/73, pulse 75, temperature 98 F (36.7 C), resp. rate 18, SpO2  100%.   Constitutional: cachetic female, laying in bed, in no acute distress, answering questions appropriately, coherent and intact speech  HENT: normocephalic atraumatic, mucous membranes moist Cardiovascular: regular rate and rhythm, no m/r/g Pulmonary/Chest: normal work of breathing on room air, lungs clear to auscultation bilaterally Abdominal: soft, non-tender, non-distended, bowel sounds presents  MSK: normal bulk and tone Neurological Exam: PERRL, EOMI  - Alert and oriented to name, year, location, and event.   -  Lifts all extremities antigravity, no drift noted.  - CN II-XII intact (left facial muscle weakness noted during "smile") .  - Able to answer simple math questions, Name weekdays backwards. Skin: warm and dry Psych: depressed mood, flat affect    Labs: CBC    Component Value Date/Time   WBC 7.9 05/02/2023 1019   RBC 4.07 05/02/2023 1019   HGB 12.6 05/02/2023 1028   HCT 37.0 05/02/2023 1028   PLT 203 05/02/2023 1019   MCV 92.9 05/02/2023 1019   MCH 31.0 05/02/2023 1019   MCHC 33.3 05/02/2023 1019   RDW 12.7 05/02/2023 1019   LYMPHSABS 1.6 05/02/2023 1019   MONOABS 0.6 05/02/2023 1019   EOSABS 0.0 05/02/2023 1019   BASOSABS 0.0 05/02/2023 1019     CMP     Component Value Date/Time   NA 141 05/02/2023 1028   K 3.2 (L) 05/02/2023 1028   CL 103 05/02/2023 1028   CO2 26 05/02/2023 1019   GLUCOSE 114 (H) 05/02/2023 1028   BUN 4 (L) 05/02/2023 1028   CREATININE 0.70 05/02/2023 1028   CALCIUM 8.9 05/02/2023 1019   PROT 6.1 (L) 05/02/2023 1019   ALBUMIN 3.7 05/02/2023 1019   AST 13 (L) 05/02/2023 1019   ALT 8 05/02/2023 1019   ALKPHOS 40 05/02/2023 1019   BILITOT 0.7 05/02/2023 1019   GFRNONAA >60 05/02/2023 1019    Imaging:  CT Head Code stroke wo contrast  Brain: Cerebral volume is stable, normal for age. No midline shift, ventriculomegaly, mass effect, evidence of mass lesion, intracranial hemorrhage or evidence of cortically based acute infarction. Gray-white matter differentiation is within normal limits throughout the brain.  IMPRESSION: 1. Stable and normal for age noncontrast CT appearance of the brain. ASPECTS 10.  CT Angio head and neck w wo contrast   IMPRESSION: Negative for large vessel occlusion. Stable CTA Head and Neck with no significant atherosclerosis or stenosis identified.  MRI  IMPRESSION: Small cavernoma in the right centrum semiovale, unchanged. Otherwise unremarkable brain MRI with no acute intracranial pathology.  EKG: personally  reviewed my interpretation is Sinus rate and rhythm. Normal PR interval. Normal QRS duration. QT prolongation 474. No previous EKG to compare.   ASSESSMENT & PLAN:   Assessment & Plan by Problem: Principal Problem:   Dysarthria   Macelynn Gabehart is a 62 y.o. person living with past medical history COPD, depression, anxiety, endometrial-colorectal cancer, vertigo presented with AMS and dysarthria, admitted for further stroke work up.  Dysarthria AMS Recurrent falls, physical deconditioning Patient presented confusion, nonsensical speech and LKN 2100 last night, multiple falls and confusion. Patient reported associated symptoms of dizziness, fatigue, and generalized weakness over the course of couple of weeks. Differentials include TIA/stroke vs seizures vs electrolyte disturbances, Infectious, structural, and toxins/medications. Patient is alert and oriented x4 with no focal deficits. She answered questions appropriately, coherent and intact speech. No limb weakness or drift noted. BMP remarkable for low K 3.3, otherwise within normal range. CBC unremarkable, no leukocytosis or anemia. UA negative for leukocytes and nitrites,  no bacteria or WBC noted. CT head negative for structural causes, no evidence of prior infarcts. MRI negative intracranial abnormality. EEG within normal limits, no seizures or epileptiform discharges noted. Negative acetaminophen and salicylate level. Working up other causes such as hypothyroidism and B12 deficiencies.    - Follow up on TSH, B1, B12 levels, HIV, RPR  - B12 goal >400 - Start thiamine 500 mg q8h  - PT/OT evaluation  - SLP evaluation   Hypokalemia Presented with K 3.3, 3.2 likely due to decrease PO intake. Will replete   Depression/Anxiety Patient with depressed mood for the past couple of weeks. She reports she does not feel like getting up from bed. She feels tired, says "I want to quit". She has decrease PO intake, not eating and drinking well lately.  Currently, no thoughts of hurting herself. Patient may benefit from psych consult.  - START Lexapro 20 mg daily, trazodone 50 mg at bedtime    COPD Currently not in exacerbation, denies any acute chest pains, shortness of breath, or cough with sputum production. Will continue home albuterol inhaler   Diet: Normal VTE: Enoxaparin IVF: None  Code: Full  Prior to Admission Living Arrangement: Home, living Son  Anticipated Discharge Location: Home Barriers to Discharge: Medical management   Dispo: Admit patient to Observation with expected length of stay less than 2 midnights.  Signed: Jeral Pinch, DO Internal Medicine Resident PGY-1  05/02/2023, 2:07 PM

## 2023-05-02 NOTE — Progress Notes (Signed)
SLP Cancellation Note  Patient Details Name: Julia Osborne MRN: [id number] DOB: March 29, 1961   Cancelled treatment:       Reason Eval/Treat Not Completed: SLP screened, no needs identified, will sign off  Pt passed [facility] swallow screen and Tasha, RN denied the pt having any difficulty swallowing thereafter; therefore, no formalized SLP swallow eval is needed per protocol. Will adjust SLP orders to exclude the swallow evaluation.     I. Vear Clock, MS, CCC-SLP Acute Rehabilitation Services Office number [phone]  Scheryl Marten 05/02/2023, 2:35 PM

## 2023-05-02 NOTE — Progress Notes (Signed)
Dr. Rayvon Char on IMS was made aware that SBP during day=134-176. Now SBP=96-98. HR in 60s.  Pt c/o dizzy. Has heartburn. LBM=8/8. no N/V & abd pain +gas.

## 2023-05-02 NOTE — ED Notes (Signed)
ED TO INPATIENT HANDOFF REPORT  ED Nurse Name and Phone #: Rodney Booze [phone]  S Name/Age/Gender Julia Osborne 62 y.o. female Room/Bed: 025C/025C  Code Status   Code Status: Full Code  Home/SNF/Other Home Patient oriented to: self and place Is this baseline? No   Triage Complete: Triage complete  Chief Complaint Dysarthria [R47.1]  Triage Note Report given at bridge: PT was BIB by [facility] with a LKW OF 0900am, Left sided weakness and elevated BP.    Allergies Not on File  Level of Care/Admitting Diagnosis ED Disposition     ED Disposition  Admit   Condition  --   Comment  Hospital Area: [facility] [[id number]]  Level of Care: Telemetry Medical [104]  May place patient in observation at [facility] or [facility] if equivalent level of care is available:: No  Covid Evaluation: Asymptomatic - no recent exposure (last 10 days) testing not required  Diagnosis: Dysarthria [784.51.ICD-9-CM]  Admitting Physician: Earl Lagos [[id number]]  Attending Physician: Earl Lagos [phone]          B Medical/Surgery History No past medical history on file.    A IV Location/Drains/Wounds Patient Lines/Drains/Airways Status     Active Line/Drains/Airways     None            Intake/Output Last 24 hours No intake or output data in the 24 hours ending 05/02/23 1323  Labs/Imaging Results for orders placed or performed during the hospital encounter of 05/02/23 (from the past 48 hour(s))  CBG monitoring, ED     Status: Abnormal   Collection Time: 05/02/23 10:18 AM  Result Value Ref Range   Glucose-Capillary 117 (H) 70 - 99 mg/dL    Comment: Glucose reference range applies only to samples taken after fasting for at least 8 hours.  Ethanol     Status: None   Collection Time: 05/02/23 10:19 AM  Result Value Ref Range   Alcohol, Ethyl (B) <10 <10 mg/dL    Comment: (NOTE) Lowest detectable limit for serum alcohol is 10 mg/dL.  For medical  purposes only. Performed at [facility], [address], [place], [place] [address]   Protime-INR     Status: None   Collection Time: 05/02/23 10:19 AM  Result Value Ref Range   Prothrombin Time 13.3 11.4 - 15.2 seconds   INR 1.0 0.8 - 1.2    Comment: (NOTE) INR goal varies based on device and disease states. Performed at [facility], [address], [place], [place] [address]   APTT     Status: None   Collection Time: 05/02/23 10:19 AM  Result Value Ref Range   aPTT 25 24 - 36 seconds    Comment: Performed at [facility], [address], [place], [place] [address]  CBC     Status: None   Collection Time: 05/02/23 10:19 AM  Result Value Ref Range   WBC 7.9 4.0 - 10.5 K/uL   RBC 4.07 3.87 - 5.11 MIL/uL   Hemoglobin 12.6 12.0 - 15.0 g/dL   HCT 41.6 60.6 - 30.1 %   MCV 92.9 80.0 - 100.0 fL   MCH 31.0 26.0 - 34.0 pg   MCHC 33.3 30.0 - 36.0 g/dL   RDW 60.1 09.3 - 23.5 %   Platelets 203 150 - 400 K/uL   nRBC 0.0 0.0 - 0.2 %    Comment: Performed at [facility], [address], [place], [place] [address]  Differential  Status: None   Collection Time: 05/02/23 10:19 AM  Result Value Ref Range   Neutrophils Relative % 71 %   Neutro Abs 5.6 1.7 - 7.7 K/uL   Lymphocytes Relative 21 %   Lymphs Abs 1.6 0.7 - 4.0 K/uL   Monocytes Relative 7 %   Monocytes Absolute 0.6 0.1 - 1.0 K/uL   Eosinophils Relative 0 %   Eosinophils Absolute 0.0 0.0 - 0.5 K/uL   Basophils Relative 1 %   Basophils Absolute 0.0 0.0 - 0.1 K/uL   Immature Granulocytes 0 %   Abs Immature Granulocytes 0.03 0.00 - 0.07 K/uL    Comment: Performed at [facility], [address] [place], [place] [address]  Comprehensive metabolic panel     Status: Abnormal   Collection Time: 05/02/23 10:19 AM  Result Value Ref Range   Sodium 140 135 - 145 mmol/L   Potassium 3.3 (L) 3.5 - 5.1 mmol/L   Chloride 105 98 - 111 mmol/L   CO2 26 22 - 32 mmol/L   Glucose, Bld 115 (H) 70 - 99 mg/dL     Comment: Glucose reference range applies only to samples taken after fasting for at least 8 hours.   BUN 6 6 - 20 mg/dL   Creatinine, Ser 9.56 0.44 - 1.00 mg/dL   Calcium 8.9 8.9 - 38.7 mg/dL   Total Protein 6.1 (L) 6.5 - 8.1 g/dL   Albumin 3.7 3.5 - 5.0 g/dL   AST 13 (L) 15 - 41 U/L   ALT 8 0 - 44 U/L   Alkaline Phosphatase 40 38 - 126 U/L   Total Bilirubin 0.7 0.3 - 1.2 mg/dL   GFR, Estimated >56 >43 mL/min    Comment: (NOTE) Calculated using the CKD-EPI Creatinine Equation (2021)    Anion gap 9 5 - 15    Comment: Performed at [facility], [address] [place], [place] [address]  I-stat chem 8, ED     Status: Abnormal   Collection Time: 05/02/23 10:28 AM  Result Value Ref Range   Sodium 141 135 - 145 mmol/L   Potassium 3.2 (L) 3.5 - 5.1 mmol/L   Chloride 103 98 - 111 mmol/L   BUN 4 (L) 6 - 20 mg/dL   Creatinine, Ser 8.84 0.44 - 1.00 mg/dL   Glucose, Bld 166 (H) 70 - 99 mg/dL    Comment: Glucose reference range applies only to samples taken after fasting for at least 8 hours.   Calcium, Ion 1.06 (L) 1.15 - 1.40 mmol/L   TCO2 25 22 - 32 mmol/L   Hemoglobin 12.6 12.0 - 15.0 g/dL   HCT 06.3 01.6 - 01.0 %  Urinalysis, Routine w reflex microscopic -Urine, Clean Catch     Status: Abnormal   Collection Time: 05/02/23 11:35 AM  Result Value Ref Range   Color, Urine STRAW (A) YELLOW   APPearance CLEAR CLEAR   Specific Gravity, Urine 1.016 1.005 - 1.030   pH 7.0 5.0 - 8.0   Glucose, UA NEGATIVE NEGATIVE mg/dL   Hgb urine dipstick SMALL (A) NEGATIVE   Bilirubin Urine NEGATIVE NEGATIVE   Ketones, ur NEGATIVE NEGATIVE mg/dL   Protein, ur NEGATIVE NEGATIVE mg/dL   Nitrite NEGATIVE NEGATIVE   Leukocytes,Ua NEGATIVE NEGATIVE   RBC / HPF 0-5 0 - 5 RBC/hpf   WBC, UA 0-5 0 - 5 WBC/hpf   Bacteria, UA NONE SEEN NONE SEEN   Squamous Epithelial / HPF 0-5 0 - 5 /HPF  Comment: Performed at [facility], [address] [place], [place] [address]   CT ANGIO HEAD NECK W  WO CM (CODE STROKE)  Result Date: 05/02/2023 CLINICAL DATA:  62 year old female code stroke. EXAM: CT ANGIOGRAPHY HEAD AND NECK WITH AND WITHOUT CONTRAST TECHNIQUE: Multidetector CT imaging of the head and neck was performed using the standard protocol during bolus administration of intravenous contrast. Multiplanar CT image reconstructions and MIPs were obtained to evaluate the vascular anatomy. Carotid stenosis measurements (when applicable) are obtained utilizing NASCET criteria, using the distal internal carotid diameter as the denominator. RADIATION DOSE REDUCTION: This exam was performed according to the departmental dose-optimization program which includes automated exposure control, adjustment of the mA and/or kV according to patient size and/or use of iterative reconstruction technique. CONTRAST:  75mL OMNIPAQUE IOHEXOL 350 MG/ML SOLN COMPARISON:  Head CT 1028 hours today. Prior CTA head and neck 12/06/2022 [facility] FINDINGS: CTA NECK Skeleton: No acute osseous abnormality identified. Upper chest: Right chest Port-A-Cath. Negative visible mediastinum. Visible lungs are clear. Other neck: Slight rightward gaze. No acute soft tissue finding identified in the neck. Aortic arch: Normal 3 vessel arch. Right carotid system: Stable and negative. No significant plaque or stenosis. Left carotid system: Stable and negative. Vertebral arteries: Proximal right subclavian artery and right vertebral artery origin are normal. Right vertebral artery is stable, negative to the skull base. Proximal left subclavian artery and left vertebral artery origin are normal. Codominant left vertebral artery is patent, stable and negative to the skull base. CTA HEAD Posterior circulation: Codominant distal vertebral arteries and vertebrobasilar junction are patent without stenosis. Both PICA origins are normal. Patent basilar artery without stenosis. Fetal type bilateral PCA origins. Patent SCA origins.  Bilateral PCA branches are stable and within normal limits. Anterior circulation: Both ICA siphons are patent. No significant siphon plaque or stenosis. Normal ophthalmic and posterior communicating artery origins. Patent carotid termini. Normal MCA and ACA origins. Normal anterior communicating artery. Dominant left ACA redemonstrated, normal variant. Bilateral ACA branches are stable and within normal limits. Left MCA M1 segment and bifurcation are patent without stenosis. Right MCA M1 segment and bifurcation are patent without stenosis. Bilateral MCA branches are stable and within normal limits. Venous sinuses: Patent. Anatomic variants: Fetal type bilateral PCA origins. Dominant left ACA. Review of the MIP images confirms the above findings IMPRESSION: Negative for large vessel occlusion. Stable CTA Head and Neck with no significant atherosclerosis or stenosis identified. These results were communicated to Dr. Iver Nestle at 10:44 am on 05/02/2023 by text page via the [facility] messaging system. Electronically Signed   By: Odessa Fleming M.D.   On: 05/02/2023 10:44   CT HEAD CODE STROKE WO CONTRAST  Result Date: 05/02/2023 CLINICAL DATA:  Code stroke.  62 year old female. EXAM: CT HEAD WITHOUT CONTRAST TECHNIQUE: Contiguous axial images were obtained from the base of the skull through the vertex without intravenous contrast. RADIATION DOSE REDUCTION: This exam was performed according to the departmental dose-optimization program which includes automated exposure control, adjustment of the mA and/or kV according to patient size and/or use of iterative reconstruction technique. COMPARISON:  Brain and Head CTMRI 12/06/2022. FINDINGS: Brain: Cerebral volume is stable, normal for age. No midline shift, ventriculomegaly, mass effect, evidence of mass lesion, intracranial hemorrhage or evidence of cortically based acute infarction. Gray-white matter differentiation is within normal limits throughout the brain. Vascular: No  suspicious intracranial vascular hyperdensity. Skull: No acute osseous abnormality identified. Sinuses/Orbits: Visualized paranasal sinuses and mastoids are stable  and well aerated. Other: Slight rightward gaze. Visualized scalp soft tissues are within normal limits. ASPECTS [facility] Stroke Program Early CT Score) Total score (0-10 with 10 being normal): 10 IMPRESSION: 1. Stable and normal for age noncontrast CT appearance of the brain. ASPECTS 10. 2. These results were communicated to Dr. Iver Nestle at 10:31 am on 05/02/2023 by text page via the [facility] messaging system. Electronically Signed   By: Odessa Fleming M.D.   On: 05/02/2023 10:31    Pending Labs Unresulted Labs (From admission, onward)     Start     Ordered   05/03/23 0500  Basic metabolic panel  Tomorrow morning,   R        05/02/23 1319   05/03/23 0500  CBC  Tomorrow morning,   R        05/02/23 1319   05/02/23 1200  hCG, serum, qualitative  Once,   R        05/02/23 1200   05/02/23 1147  Vitamin B12  Once,   URGENT        05/02/23 1146   05/02/23 1147  Vitamin B1  Once,   URGENT        05/02/23 1146   05/02/23 1147  RPR  Once,   URGENT        05/02/23 1146   05/02/23 1147  HIV Antibody (routine testing w rflx)  (HIV Antibody (Routine testing w reflex) panel)  Once,   URGENT        05/02/23 1146   05/02/23 1147  Ammonia  Once,   STAT        05/02/23 1146   05/02/23 1147  TSH  Once,   URGENT        05/02/23 1146   05/02/23 1108  Salicylate level  Once,   STAT        05/02/23 1107   05/02/23 1108  Acetaminophen level  Once,   STAT        05/02/23 1107   05/02/23 1024  Urine rapid drug screen (hosp performed)  Once,   STAT        05/02/23 1023            Vitals/Pain Today's Vitals   05/02/23 1030 05/02/23 1041 05/02/23 1215  BP: (!) 164/88 (!) 176/73 (!) 175/79  Pulse:  75 65  Resp:  18 15  Temp:  98 F (36.7 C)   SpO2:  100% 100%    Isolation Precautions No active isolations  Medications Medications  thiamine  (VITAMIN B1) 500 mg in sodium chloride 0.9 % 50 mL IVPB (has no administration in time range)  midazolam (VERSED) injection 2 mg (has no administration in time range)  enoxaparin (LOVENOX) injection 40 mg (has no administration in time range)  acetaminophen (TYLENOL) tablet 650 mg (has no administration in time range)    Or  acetaminophen (TYLENOL) suppository 650 mg (has no administration in time range)  senna-docusate (Senokot-S) tablet 1 tablet (has no administration in time range)  iohexol (OMNIPAQUE) 350 MG/ML injection 75 mL (75 mLs Intravenous Contrast Given 05/02/23 1036)    Mobility walks     Focused Assessments Cardiac Assessment Handoff:  Cardiac Rhythm: Normal sinus rhythm No results found for: "CKTOTAL", "CKMB", "CKMBINDEX", "TROPONINI" No results found for: "DDIMER" Does the Patient currently have chest pain? No   , Neuro Assessment Handoff:  Swallow screen pass? Yes  Cardiac Rhythm: Normal sinus rhythm NIH Stroke Scale  Dizziness Present: No Headache Present: No  Interval: 2 hrs post IV thrombolytic Level of Consciousness (1a.)   : Not alert, but arousable by minor stimulation to obey, answer, or respond LOC Questions (1b. )   : Answers neither question correctly LOC Commands (1c. )   : Performs both tasks correctly Best Gaze (2. )  : Normal Visual (3. )  : No visual loss Facial Palsy (4. )    : Normal symmetrical movements Motor Arm, Left (5a. )   : No drift Motor Arm, Right (5b. ) : No drift Motor Leg, Left (6a. )  : No drift Motor Leg, Right (6b. ) : Some effort against gravity Limb Ataxia (7. ): Absent Sensory (8. )  : Normal, no sensory loss Best Language (9. )  : Mild-to-moderate aphasia Dysarthria (10. ): Mild-to-moderate dysarthria, patient slurs at least some words and, at worst, can be understood with some difficulty Extinction/Inattention (11.)   : Visual/tactile/auditory/spatial/personal inattention Complete NIHSS TOTAL: 8 Last date known well:  05/01/23 Last time known well: 2100 Neuro Assessment:   Neuro Checks:   Initial (05/02/23 1030)  Has TPA been given? No If patient is a Neuro Trauma and patient is going to OR before floor call report to 4N Charge nurse: [phone] or [phone]   R Recommendations: See Admitting Provider Note  Report given to:   Additional Notes:

## 2023-05-02 NOTE — Code Documentation (Signed)
Stroke Response Nurse Documentation Code Documentation  Patient arrived to [facility] via [facility]; noted left sided weakness and facial droop with EMS, LKW 2100 when she went to sleep. Woke up this morning and son observed she was "speaking gibberish and falling all over the place." Stroke team met on arrival, cleared for CT by EDP Kommer. NIH 9, see flowsheets for details. CT and CTA completed. OOW for TNK. No LVO. Care Plan: q2h NIH and vitals. Bedside handoff with ED RN Rodney Booze.    Julia Osborne  Rapid Response RN

## 2023-05-02 NOTE — Procedures (Signed)
Patient Name: Julia Osborne  MRN: [id number]  Epilepsy Attending: Charlsie Quest  Referring Physician/Provider: Gordy Councilman, MD  Date: 05/02/2023 Duration: 24.07 mins  Patient history: 62 y.o. female  past medical history of anxiety, COPD, chronic pain, colon cancer, glaucoma, ovarian/uterine cancer (2015), and vertigo presenting with altered mental status. EEG to evaluate for seizure.  Level of alertness: Awake  AEDs during EEG study: None  Technical aspects: This EEG study was done with scalp electrodes positioned according to the 10-20 International system of electrode placement. Electrical activity was reviewed with band pass filter of 1-70Hz , sensitivity of 7 uV/mm, display speed of 33mm/sec with a 60Hz  notched filter applied as appropriate. EEG data were recorded continuously and digitally stored.  Video monitoring was available and reviewed as appropriate.  Description: The posterior dominant rhythm consists of 9-10 Hz activity of moderate voltage (25-35 uV) seen predominantly in posterior head regions, symmetric and reactive to eye opening and eye closing. Hyperventilation and photic stimulation were not performed.     IMPRESSION: This study is within normal limits. No seizures or epileptiform discharges were seen throughout the recording.  A normal interictal EEG does not exclude the diagnosis of epilepsy.   Annabelle Harman

## 2023-05-02 NOTE — ED Notes (Signed)
When checking on PT, PT stated that she is overwhelmed and under a lot of pressure.

## 2023-05-02 NOTE — ED Notes (Signed)
Called [facility] to transport and charge nurse asked if the PT can be held because she just had a fall and was shortstaffed

## 2023-05-02 NOTE — ED Provider Notes (Signed)
[place] EMERGENCY DEPARTMENT AT [facility] Provider Note   CSN: [id number] Arrival date & time: 05/02/23  1016  An emergency department physician performed an initial assessment on this suspected stroke patient at 1017.  History  Chief Complaint  Patient presents with   Code Stroke    Julia Osborne is a 62 y.o. female.  HPI   Patient has history of multiple medical problems including COPD depression colon cancer.  Patient presented to the ED with altered mental status.  She presented as a code stroke.  Per EMS report patient was in her normal state of health when she went to bed last night about 9 PM.  Patient was found confused this morning she had fallen several times.  Patient was mumbling and not speaking clearly.  EMS was called.  Patient does report feeling depressed and not wanting to live anymore.  Home Medications Prior to Admission medications   Not on File      Allergies    Patient has no allergy information on record.    Review of Systems   Review of Systems  Physical Exam Updated Vital Signs BP (!) 176/73 (BP Location: Left Arm)   Pulse 75   Temp 98 F (36.7 C)   Resp 18   SpO2 100%  Physical Exam Vitals and nursing note reviewed.  Constitutional:      General: She is not in acute distress.    Appearance: She is well-developed.  HENT:     Head: Normocephalic and atraumatic.     Right Ear: External ear normal.     Left Ear: External ear normal.  Eyes:     General: No scleral icterus.       Right eye: No discharge.        Left eye: No discharge.     Conjunctiva/sclera: Conjunctivae normal.  Neck:     Trachea: No tracheal deviation.  Cardiovascular:     Rate and Rhythm: Normal rate and regular rhythm.  Pulmonary:     Effort: Pulmonary effort is normal. No respiratory distress.     Breath sounds: Normal breath sounds. No stridor. No wheezing or rales.  Abdominal:     General: Bowel sounds are normal. There is no distension.      Palpations: Abdomen is soft.     Tenderness: There is no abdominal tenderness. There is no guarding or rebound.  Musculoskeletal:        General: No tenderness or deformity.     Cervical back: Neck supple.  Skin:    General: Skin is warm and dry.     Findings: No rash.  Neurological:     General: No focal deficit present.     Mental Status: She is lethargic.     Cranial Nerves: No cranial nerve deficit.     Sensory: No sensory deficit.     Motor: Weakness present. No abnormal muscle tone or seizure activity.     Coordination: Coordination normal.     Comments: Speech is mumbling, dysarthric patient does slightly lift her arms and legs off the bed but movements are very slow and deliberate  Psychiatric:        Mood and Affect: Mood normal.     ED Results / Procedures / Treatments   Labs (all labs ordered are listed, but only abnormal results are displayed) Labs Reviewed  COMPREHENSIVE METABOLIC PANEL - Abnormal; Notable for the following components:      Result Value   Potassium 3.3 (*)  Glucose, Bld 115 (*)    Total Protein 6.1 (*)    AST 13 (*)    All other components within normal limits  CBG MONITORING, ED - Abnormal; Notable for the following components:   Glucose-Capillary 117 (*)    All other components within normal limits  I-STAT CHEM 8, ED - Abnormal; Notable for the following components:   Potassium 3.2 (*)    BUN 4 (*)    Glucose, Bld 114 (*)    Calcium, Ion 1.06 (*)    All other components within normal limits  ETHANOL  PROTIME-INR  APTT  CBC  DIFFERENTIAL  RAPID URINE DRUG SCREEN, HOSP PERFORMED  URINALYSIS, ROUTINE W REFLEX MICROSCOPIC  HCG, SERUM, QUALITATIVE  SALICYLATE LEVEL  ACETAMINOPHEN LEVEL  VITAMIN B12  VITAMIN B1  RPR  HIV ANTIBODY (ROUTINE TESTING W REFLEX)  AMMONIA  TSH    EKG EKG Interpretation Date/Time:  Sunday May 02 2023 10:46:15 EDT Ventricular Rate:  70 PR Interval:  147 QRS Duration:  81 QT Interval:  439 QTC  Calculation: 474 R Axis:   80  Text Interpretation: Sinus rhythm Nonspecific T abnrm, anterolateral leads Confirmed by Linwood Dibbles [phone]) on 05/02/2023 11:01:43 AM  Radiology CT ANGIO HEAD NECK W WO CM (CODE STROKE)  Result Date: 05/02/2023 CLINICAL DATA:  62 year old female code stroke. EXAM: CT ANGIOGRAPHY HEAD AND NECK WITH AND WITHOUT CONTRAST TECHNIQUE: Multidetector CT imaging of the head and neck was performed using the standard protocol during bolus administration of intravenous contrast. Multiplanar CT image reconstructions and MIPs were obtained to evaluate the vascular anatomy. Carotid stenosis measurements (when applicable) are obtained utilizing NASCET criteria, using the distal internal carotid diameter as the denominator. RADIATION DOSE REDUCTION: This exam was performed according to the departmental dose-optimization program which includes automated exposure control, adjustment of the mA and/or kV according to patient size and/or use of iterative reconstruction technique. CONTRAST:  75mL OMNIPAQUE IOHEXOL 350 MG/ML SOLN COMPARISON:  Head CT 1028 hours today. Prior CTA head and neck 12/06/2022 [facility] FINDINGS: CTA NECK Skeleton: No acute osseous abnormality identified. Upper chest: Right chest Port-A-Cath. Negative visible mediastinum. Visible lungs are clear. Other neck: Slight rightward gaze. No acute soft tissue finding identified in the neck. Aortic arch: Normal 3 vessel arch. Right carotid system: Stable and negative. No significant plaque or stenosis. Left carotid system: Stable and negative. Vertebral arteries: Proximal right subclavian artery and right vertebral artery origin are normal. Right vertebral artery is stable, negative to the skull base. Proximal left subclavian artery and left vertebral artery origin are normal. Codominant left vertebral artery is patent, stable and negative to the skull base. CTA HEAD Posterior circulation: Codominant distal  vertebral arteries and vertebrobasilar junction are patent without stenosis. Both PICA origins are normal. Patent basilar artery without stenosis. Fetal type bilateral PCA origins. Patent SCA origins. Bilateral PCA branches are stable and within normal limits. Anterior circulation: Both ICA siphons are patent. No significant siphon plaque or stenosis. Normal ophthalmic and posterior communicating artery origins. Patent carotid termini. Normal MCA and ACA origins. Normal anterior communicating artery. Dominant left ACA redemonstrated, normal variant. Bilateral ACA branches are stable and within normal limits. Left MCA M1 segment and bifurcation are patent without stenosis. Right MCA M1 segment and bifurcation are patent without stenosis. Bilateral MCA branches are stable and within normal limits. Venous sinuses: Patent. Anatomic variants: Fetal type bilateral PCA origins. Dominant left ACA. Review of the MIP images confirms the above findings IMPRESSION: Negative for  large vessel occlusion. Stable CTA Head and Neck with no significant atherosclerosis or stenosis identified. These results were communicated to Dr. Iver Nestle at 10:44 am on 05/02/2023 by text page via the [facility] messaging system. Electronically Signed   By: Odessa Fleming M.D.   On: 05/02/2023 10:44   CT HEAD CODE STROKE WO CONTRAST  Result Date: 05/02/2023 CLINICAL DATA:  Code stroke.  62 year old female. EXAM: CT HEAD WITHOUT CONTRAST TECHNIQUE: Contiguous axial images were obtained from the base of the skull through the vertex without intravenous contrast. RADIATION DOSE REDUCTION: This exam was performed according to the departmental dose-optimization program which includes automated exposure control, adjustment of the mA and/or kV according to patient size and/or use of iterative reconstruction technique. COMPARISON:  Brain and Head CTMRI 12/06/2022. FINDINGS: Brain: Cerebral volume is stable, normal for age. No midline shift, ventriculomegaly, mass effect,  evidence of mass lesion, intracranial hemorrhage or evidence of cortically based acute infarction. Gray-white matter differentiation is within normal limits throughout the brain. Vascular: No suspicious intracranial vascular hyperdensity. Skull: No acute osseous abnormality identified. Sinuses/Orbits: Visualized paranasal sinuses and mastoids are stable and well aerated. Other: Slight rightward gaze. Visualized scalp soft tissues are within normal limits. ASPECTS [facility] Stroke Program Early CT Score) Total score (0-10 with 10 being normal): 10 IMPRESSION: 1. Stable and normal for age noncontrast CT appearance of the brain. ASPECTS 10. 2. These results were communicated to Dr. Iver Nestle at 10:31 am on 05/02/2023 by text page via the [facility] messaging system. Electronically Signed   By: Odessa Fleming M.D.   On: 05/02/2023 10:31    Procedures Procedures    Medications Ordered in ED Medications  thiamine (VITAMIN B1) 500 mg in sodium chloride 0.9 % 50 mL IVPB (has no administration in time range)  midazolam (VERSED) injection 2 mg (has no administration in time range)  iohexol (OMNIPAQUE) 350 MG/ML injection 75 mL (75 mLs Intravenous Contrast Given 05/02/23 1036)    ED Course/ Medical Decision Making/ A&P Clinical Course as of 05/02/23 1245  Sun May 02, 2023  1243 Labs reviewed.  No significant metabolic abnormality P.  CBC normal. [JK]  1244 CT angio head and neck without acute abnormality [JK]  1244 Case discussed with medical service for admission [JK]    Clinical Course User Index [JK] Linwood Dibbles, MD                                 Medical Decision Making Frontal diagnosis includes stroke, TIA, delirium, metabolic causes such as hyponatremia hypernatremia severe depression with a psychosomatic component  Problems Addressed: Depression, unspecified depression type: chronic illness or injury Dysarthria: acute illness or injury Fall, initial encounter: acute illness or injury  Amount and/or  Complexity of Data Reviewed Labs: ordered. Decision-making details documented in ED Course. Radiology: ordered and independent interpretation performed.   Patient presented to the ED for evaluation of altered mental status possible stroke.  Patient was seen by stroke team on arrival.  Initial CT scans without acute abnormality.  Patient not a candidate for tPA or vascular intervention based on her presentation.  Patient with weakness and falls difficulty with her speech.  Patient also reports severe depression.  It is possible she could have a metabolic cause contributing to her weakness.  It is also possible that she could have severe depression with catatonia and possible psychosomatic etiology.  Will plan on admission to the hospital for continued altered mental  status workup.  Patient would benefit from inpatient psychiatric consultation.        Final Clinical Impression(s) / ED Diagnoses Final diagnoses:  Dysarthria  Fall, initial encounter  Depression, unspecified depression type    Rx / DC Orders ED Discharge Orders     None         Linwood Dibbles, MD 05/02/23 1245

## 2023-05-02 NOTE — Progress Notes (Signed)
Dr. Rayvon Char said to continue monitoring pt's BP ,and  he will be by to evaluate pt on his rounds.

## 2023-05-03 DIAGNOSIS — R4182 Altered mental status, unspecified: Secondary | ICD-10-CM | POA: Diagnosis not present

## 2023-05-03 DIAGNOSIS — R471 Dysarthria and anarthria: Secondary | ICD-10-CM | POA: Diagnosis not present

## 2023-05-03 DIAGNOSIS — R296 Repeated falls: Secondary | ICD-10-CM | POA: Diagnosis not present

## 2023-05-03 DIAGNOSIS — G459 Transient cerebral ischemic attack, unspecified: Secondary | ICD-10-CM

## 2023-05-03 LAB — SARS CORONAVIRUS 2 BY RT PCR: SARS Coronavirus 2 by RT PCR: NEGATIVE

## 2023-05-03 LAB — LIPID PANEL
Cholesterol: 149 mg/dL (ref 0–200)
HDL: 49 mg/dL (ref >40)
LDL Cholesterol: 87 mg/dL (ref 0–99)
Total CHOL/HDL Ratio: 3 RATIO
Triglycerides: 66 mg/dL (ref <150)
VLDL: 13 mg/dL (ref 0–40)

## 2023-05-03 LAB — GLUCOSE, CAPILLARY: Glucose-Capillary: 115 mg/dL — ABNORMAL HIGH (ref 70–99)

## 2023-05-03 LAB — FOLATE: Folate: 6.5 ng/mL (ref >5.9)

## 2023-05-03 MED ORDER — ALUM & MAG HYDROXIDE-SIMETH 200-200-20 MG/5ML PO SUSP
30.0000 mL | ORAL | Status: DC | PRN
  Administered 2023-05-03: 30 mL via ORAL
  Filled 2023-05-03: qty 30

## 2023-05-03 MED ORDER — BRINZOLAMIDE 1 % OP SUSP
1.0000 [drp] | Freq: Three times a day (TID) | OPHTHALMIC | Status: DC
  Administered 2023-05-03 – 2023-05-04 (×3): 1 [drp] via OPHTHALMIC
  Filled 2023-05-03: qty 10

## 2023-05-03 MED ORDER — LATANOPROST 0.005 % OP SOLN
1.0000 [drp] | Freq: Every day | OPHTHALMIC | Status: DC
  Administered 2023-05-03: 1 [drp] via OPHTHALMIC
  Filled 2023-05-03: qty 2.5

## 2023-05-03 MED ORDER — PANTOPRAZOLE SODIUM 40 MG PO TBEC
40.0000 mg | DELAYED_RELEASE_TABLET | Freq: Every day | ORAL | Status: DC
  Administered 2023-05-03 – 2023-05-04 (×2): 40 mg via ORAL
  Filled 2023-05-03 (×2): qty 1

## 2023-05-03 MED ORDER — ATORVASTATIN CALCIUM 40 MG PO TABS
40.0000 mg | ORAL_TABLET | Freq: Every day | ORAL | Status: DC
  Administered 2023-05-03 – 2023-05-04 (×2): 40 mg via ORAL
  Filled 2023-05-03 (×2): qty 1

## 2023-05-03 MED ORDER — GUAIFENESIN-DM 100-10 MG/5ML PO SYRP: 5.0000 mL | ORAL_SOLUTION | ORAL | Status: DC | PRN

## 2023-05-03 MED ORDER — BRIMONIDINE TARTRATE 0.2 % OP SOLN
1.0000 [drp] | Freq: Three times a day (TID) | OPHTHALMIC | Status: DC
  Administered 2023-05-03 – 2023-05-04 (×3): 1 [drp] via OPHTHALMIC
  Filled 2023-05-03: qty 5

## 2023-05-03 MED ORDER — SENNOSIDES-DOCUSATE SODIUM 8.6-50 MG PO TABS
2.0000 | ORAL_TABLET | Freq: Two times a day (BID) | ORAL | Status: DC
  Administered 2023-05-03 – 2023-05-04 (×3): 2 via ORAL
  Filled 2023-05-03 (×3): qty 2

## 2023-05-03 MED ORDER — HYDROXYZINE HCL 25 MG PO TABS
25.0000 mg | ORAL_TABLET | Freq: Four times a day (QID) | ORAL | Status: DC | PRN
  Administered 2023-05-03: 25 mg via ORAL
  Filled 2023-05-03: qty 1

## 2023-05-03 MED ORDER — MIRTAZAPINE 15 MG PO TABS
7.5000 mg | ORAL_TABLET | Freq: Every day | ORAL | Status: DC
  Administered 2023-05-03: 7.5 mg via ORAL
  Filled 2023-05-03: qty 1

## 2023-05-03 MED ORDER — VITAMIN B-12 1000 MCG PO TABS
1000.0000 ug | ORAL_TABLET | Freq: Every day | ORAL | Status: DC
  Administered 2023-05-03 – 2023-05-04 (×2): 1000 ug via ORAL
  Filled 2023-05-03 (×2): qty 1

## 2023-05-03 MED ORDER — ASPIRIN 81 MG PO TBEC
81.0000 mg | DELAYED_RELEASE_TABLET | Freq: Every day | ORAL | Status: DC
  Administered 2023-05-03 – 2023-05-04 (×2): 81 mg via ORAL
  Filled 2023-05-03 (×2): qty 1
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
## 2023-05-03 NOTE — Progress Notes (Signed)
Speech Language Pathology Treatment:    Patient Details Name: Julia Osborne MRN: [id number] DOB: Jun 21, 1961 Today's Date: 05/03/2023 Time:  -      Received order for speech-language-cognition. Pt reported when she came in yesterday she was not making sense however today she is fluent and denies current difficulty with speech or language. She states some difficulty with memory before she came in. She states she has some difficulty swallowing because of mucous. MRI was negative. Full assessment not warranted at this time.  If pt develops difficulty swallowing, can re order if needed.                             Royce Macadamia  05/03/2023, 2:27 PM

## 2023-05-03 NOTE — Evaluation (Signed)
Occupational Therapy Evaluation Patient Details Name: Julia Osborne MRN: [id number] DOB: 09/02/1961 Today's Date: 05/03/2023   History of Present Illness 62 yo female presented 8/11 with altered mental status and speech changes. MRI negative intracranial abnormality. EEG within normal limits. PMHx: HTN, anxiety, COPD, chronic pain, colon/endometrial cancer, glaucoma.   Clinical Impression   Patient evaluated by Occupational Therapy with no further acute OT needs identified. All education has been completed and the patient has no further questions. See below for any follow-up Occupational Therapy or equipment needs. OT to sign off. Thank you for referral.         If plan is discharge home, recommend the following:      Functional Status Assessment  Patient has had a recent decline in their functional status and demonstrates the ability to make significant improvements in function in a reasonable and predictable amount of time.  Equipment Recommendations  None recommended by OT    Recommendations for Other Services       Precautions / Restrictions Precautions Precautions: Fall      Mobility Bed Mobility Overal bed mobility: Modified Independent                  Transfers Overall transfer level: Needs assistance   Transfers: Sit to/from Stand Sit to Stand: Supervision           General transfer comment: no vestibular concerns voiced or noted. pt bending over to place things across the bed on bed tray and turning 180 degrees turning head to talk to RN.      Balance Overall balance assessment: Needs assistance   Sitting balance-Leahy Scale: Normal     Standing balance support: No upper extremity supported, During functional activity Standing balance-Leahy Scale: Good                             ADL either performed or assessed with clinical judgement   ADL Overall ADL's : At baseline                                        General ADL Comments: pt completed bed mobility, bathroom transfer and sink level transfer. pt very guarded voicing internal distractions like "i wish i had my clothes" "i wish i had some shoes to wear" "i wish my family get up here with my stuff' pt noted to almost hug herself in discomfort of her lack of personal items     Vision Baseline Vision/History: 1 Wears glasses Ability to See in Adequate Light: 1 Impaired Patient Visual Report: Blurring of vision Vision Assessment?: Wears glasses for driving;Wears glasses for reading Additional Comments: pt reports a hx of glaucoma in L eye with elevated pressure that requires drops and pending new prescription for glasses. pt also noted to have cataracts and describes it as a "film" over her eyes at times. pt states when she stands up "at times its a flash of light then its normal again"     Perception Perception: Within Functional Limits       Praxis Praxis: [facility]       Pertinent Vitals/Pain Pain Assessment Pain Assessment: No/denies pain     Extremity/Trunk Assessment Upper Extremity Assessment Upper Extremity Assessment: Overall WFL for tasks assessed   Lower Extremity Assessment Lower Extremity Assessment: Generalized weakness   Cervical / Trunk Assessment Cervical / Trunk Assessment:  Normal   Communication Communication Communication: No apparent difficulties   Cognition Arousal: Alert Behavior During Therapy: WFL for tasks assessed/performed, Flat affect Overall Cognitive Status: Within Functional Limits for tasks assessed                                       General Comments       Exercises     Shoulder Instructions      Home Living Family/patient expects to be discharged to:: Private residence Living Arrangements: Children Available Help at Discharge: Family;Available 24 hours/day Type of Home: Mobile home Home Access: Stairs to enter Entrance Stairs-Number of Steps: 3 Entrance Stairs-Rails:  Right;Left Home Layout: One level     Bathroom Shower/Tub: Chief Strategy Officer: Standard Bathroom Accessibility: No   Home Equipment: None          Prior Functioning/Environment Prior Level of Function : Independent/Modified Independent;History of Falls (last six months)                        OT Problem List:        OT Treatment/Interventions:      OT Goals(Current goals can be found in the care plan section) Acute Rehab OT Goals Patient Stated Goal: to get personal items Potential to Achieve Goals: Good  OT Frequency:      Co-evaluation              AM-PAC OT "6 Clicks" Daily Activity     Outcome Measure Help from another person eating meals?: None Help from another person taking care of personal grooming?: None Help from another person toileting, which includes using toliet, bedpan, or urinal?: None Help from another person bathing (including washing, rinsing, drying)?: None Help from another person to put on and taking off regular upper body clothing?: None Help from another person to put on and taking off regular lower body clothing?: None 6 Click Score: 24   End of Session Nurse Communication: Mobility status;Precautions  Activity Tolerance: Patient tolerated treatment well Patient left: in bed;with call bell/phone within reach;with nursing/sitter in room (bed alarm off as RN helping setup IV and medications provided)  OT Visit Diagnosis: Unsteadiness on feet (R26.81)                Time: 1450-1510 OT Time Calculation (min): 20 min Charges:  OT General Charges $OT Visit: 1 Visit OT Evaluation $OT Eval Moderate Complexity: 1 Mod   Brynn, OTR/L  Acute Rehabilitation Services Office: [phone] .   Mateo Flow 05/03/2023, 3:36 PM

## 2023-05-03 NOTE — Care Management Obs Status (Signed)
MEDICARE OBSERVATION STATUS NOTIFICATION   Patient Details  Name: Narayah Puleo MRN: [id number] Date of Birth: 05/07/1961   Medicare Observation Status Notification Given:  Yes    Kermit Balo, RN 05/03/2023, 11:18 AM

## 2023-05-03 NOTE — Plan of Care (Signed)

## 2023-05-03 NOTE — Progress Notes (Signed)
HD#0 SUBJECTIVE:  Patient Summary: Julia Osborne is a 62 yo female with HTN, anxiety, COPD, chronic pain, colon/endometrial cancer, glaucoma who presented with altered mental status and speech changes and came in as a code stroke.   Overnight Events: None   Interim History: Patient is evaluated at bedside, laying in bed comfortably. She reports cough and congestion for few days, though denies any fevers. Patient reports acid reflux and indigestion. States everything tastes funny. No recent BM, last one 3-4 days ago. States her mood is down today.   OBJECTIVE:  Vital Signs: Vitals:   05/02/23 2309 05/03/23 0403 05/03/23 0729 05/03/23 1116  BP: (!) 92/55 (!) 124/59 119/60 111/67  Pulse: 60 63 (!) 58 64  Resp: 18 17 18 18   Temp: 98.6 F (37 C) 98.2 F (36.8 C) 98.5 F (36.9 C) 99 F (37.2 C)  TempSrc: Oral Oral Oral Oral  SpO2: 96% 98% 99% 100%   Supplemental O2: Room Air SpO2: 100 %  There were no vitals filed for this visit.   Intake/Output Summary (Last 24 hours) at 05/03/2023 1408 Last data filed at 05/03/2023 0900 Gross per 24 hour  Intake 480 ml  Output --  Net 480 ml   Net IO Since Admission: 480 mL [05/03/23 1408]  Physical Exam: Physical Exam  Constitutional: cachetic female, laying in bed, in no acute distress, answering questions appropriately, coherent and intact speech  HENT: normocephalic atraumatic, mucous membranes moist Cardiovascular: regular rate and rhythm, no m/r/g Pulmonary/Chest: normal work of breathing on room air, lungs clear to auscultation bilaterally Abdominal: soft, non-tender, non-distended, bowel sounds presents  MSK: normal bulk and tone Neurological Exam: Alert and oriented to name, year, location, and event. 5/5 strength upper and lower extremities bilaterally. Lifts all extremities antigravity, no drift noted. CN II-XII intact.  Skin: warm and dry Psych: depressed mood, flat affect    Patient Lines/Drains/Airways Status      Active Line/Drains/Airways     Name Placement date Placement time Site Days   Peripheral IV 05/02/23 22 G 1" Left;Posterior Forearm 05/02/23  2147  Forearm  1   Wound / Incision (Open or Dehisced) 05/02/23 Other (Comment) Arm Left;Lower;Posterior;Proximal 05/02/23  1729  Arm  1             ASSESSMENT/PLAN:  Assessment: Principal Problem:   Dysarthria   #Dysarthria #AMS #Recurrent falls, physical deconditioning Patient presented confusion, nonsensical speech and LKN 2100 last night, multiple falls and confusion. Patient reported associated symptoms of dizziness, fatigue, and generalized weakness over the course of couple of weeks. Differentials include TIA/stroke vs seizures vs electrolyte disturbances, Infectious, structural, and toxins/medications. Patient is alert and oriented x4 with no focal deficits. She answered questions appropriately, coherent and intact speech. No limb weakness or drift noted. BMP remarkable for low K 3.3, otherwise within normal range. CBC unremarkable, no leukocytosis or anemia. UA negative for leukocytes and nitrites, no bacteria or WBC noted. CT head negative for structural causes, no evidence of prior infarcts. MRI negative intracranial abnormality. EEG within normal limits, no seizures or epileptiform discharges noted. Negative acetaminophen and salicylate level. Other differentials include hypothyroidism vs B12 deficiencies vs nutritional deficiencies vs infectious TSH normal. HIV antibody negative. RPR negative. Ammonia normal. Lipid panel normal. A1c pending and B1 pending. Patient's initial symptoms highly suspicious for possible TIA.   - B12 level <177. S/p B12 1,000 unit injection. START PO 1,000 mcg B12 daily. - Continue IV thiamine 500 mg q8h - START Aspirin 81 mg and  Lipitor 40 mg   - Continue feeding supplements BID  - PT/TOC recommended Home w Home Health Services  - SLP evaluation normal.     Depression/Anxiety Patient with depressed mood  for the past couple of weeks. She reports she does not feel like getting up from bed. She feels tired, says "I want to quit". She has decrease PO intake, not eating and drinking well lately. Currently, no thoughts of hurting herself. - Continue Lexapro 20 mg daily, trazodone 50 mg at bedtime   - Psych consulted.    COPD Currently not in exacerbation, denies any acute chest pains, shortness of breath, or cough with sputum production. Will continue home albuterol inhaler   Hypokalemia Currently resolved.   Glaucoma Start Azopt and Alphagan eye drops and Latanoprost opthalmic solution.   Constipation Start docusate BID. Patient has not had a bowel movement in the past 3-4 days.   GERD Patient is complaining of acid reflux and indigestion. We started home medication Protonix 40 mg daily.   Best Practice: Diet: Regular diet IVF: Fluids: None  VTE: enoxaparin (LOVENOX) injection 40 mg Start: 05/02/23 1600 Code: Full DISPO: Anticipated discharge tomorrow to Home pending  medical management .  Signature: Jeral Pinch, D.O.  Internal Medicine Resident, PGY-1 [name]  Pager: [phone] 2:08 PM, 05/03/2023   Please contact the on call pager after 5 pm and on weekends at [phone].

## 2023-05-03 NOTE — TOC Initial Note (Signed)
Transition of Care [facility]) - Initial/Assessment Note    Patient Details  Name: Julia Osborne MRN: [id number] Date of Birth: 26-Jul-1961  [facility] CM/SW Contact:    Kermit Balo, RN Phone Number: 05/03/2023, 12:52 PM  Clinical Narrative:                 The patient lives at home with her son. She does the driving when she is not dizzy. CM reminded her to use Medicaid transportation as needed. She states she has the number for medicaid transportation already. Walker for home ordered through [employer] and it will be delivered to her room. Home health set up with [facility]. Information on the AVS.  Pt will need transportation assistance home.  Expected Discharge Plan: Home w Home Health Services Barriers to Discharge: Continued Medical Work up   Patient Goals and CMS Choice   CMS Medicare.gov Compare Post Acute Care list provided to:: Patient Choice offered to / list presented to : Patient      Expected Discharge Plan and Services   Discharge Planning Services: CM Consult Post Acute Care Choice: Home Health, Durable Medical Equipment Living arrangements for the past 2 months: Single Family Home                 DME Arranged: Walker rolling DME Agency: [employer] Date DME Agency Contacted: 05/03/23   Representative spoke with at DME Agency: Zack HH Arranged: PT, OT, Social Work [employer] Agency: [facility] Date [facility] Agency Contacted: 05/03/23   Representative spoke with at [facility] Agency: Kandee Keen  Prior Living Arrangements/Services Living arrangements for the past 2 months: Single Family Home Lives with:: Adult Children Patient language and need for interpreter reviewed:: Yes Do you feel safe going back to the place where you live?: Yes            Criminal Activity/Legal Involvement Pertinent to Current Situation/Hospitalization: No - Comment as needed  Activities of Daily Living Home Assistive Devices/Equipment: None ADL Screening (condition at time of  admission) Patient's cognitive ability adequate to safely complete daily activities?: Yes Is the patient deaf or have difficulty hearing?: No Does the patient have difficulty seeing, even when wearing glasses/contacts?: Yes Does the patient have difficulty concentrating, remembering, or making decisions?: No Patient able to express need for assistance with ADLs?: Yes Does the patient have difficulty dressing or bathing?: Yes Independently performs ADLs?: Yes (appropriate for developmental age) Does the patient have difficulty walking or climbing stairs?: Yes Weakness of Legs: None Weakness of Arms/Hands: None  Permission Sought/Granted                  Emotional Assessment Appearance:: Appears stated age Attitude/Demeanor/Rapport: Engaged Affect (typically observed): Accepting Orientation: : Oriented to Self, Oriented to Place, Oriented to  Time, Oriented to Situation   Psych Involvement: Yes (comment)  Admission diagnosis:  Dysarthria [R47.1] Fall, initial encounter [W19.XXXA] Depression, unspecified depression type [F32.A] Patient Active Problem List   Diagnosis Date Noted   Dysarthria 05/02/2023   PCP:  Ailene Ravel, MD Pharmacy:   [facility] [phone] - [place], [place] - [address] AT [facility] [address] [place] [address] Phone: [phone] Fax: [phone]     Social Determinants of Health (SDOH) Social History: SDOH Screenings   Food Insecurity: No Food Insecurity (05/02/2023)  Housing: Patient Declined (05/02/2023)  Transportation Needs: No Transportation Needs (05/02/2023)  Utilities: Not At Risk (05/02/2023)  Tobacco Use: Low Risk  (05/02/2023)   SDOH  Interventions:     Readmission Risk Interventions     No data to display
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
## 2023-05-03 NOTE — Evaluation (Signed)
Physical Therapy Evaluation Patient Details Name: Julia Osborne MRN: [id number] DOB: 05-19-1961 Today's Date: 05/03/2023  History of Present Illness  62 yo female presented 8/11 with altered mental status and speech changes. MRI negative intracranial abnormality. EEG within normal limits. PMHx: HTN, anxiety, COPD, chronic pain, colon/endometrial cancer, glaucoma.  Clinical Impression  Pt admitted with above diagnosis. Previously independent, driving. Currently requires supervision and use of a rolling walker to safely ambulate. Pt with several subjective vestibular complaints she attributes to her fall at home. Feeling as if the "room is spinning," and "wobbly" with positional changes. Conducted vestibular assessment without any overt objective findings, and symptoms relatively mild with positional changes today. States she was supposed to see an ENT recently but has not been. Recent Lt ear infection treated, when dizziness began. Continues to have aural fullness and dizziness after finishing abx. Lives with son who she states is available 24/7 and can assist as needed. Pt would benefit from RW at this time due to instability with gait. Pt currently with functional limitations due to the deficits listed below (see PT Problem List). Pt will benefit from acute skilled PT to increase their independence and safety with mobility to allow discharge.      05/03/23 0900  Orthostatic Lying   BP- Lying 117/63  Pulse- Lying 61  Orthostatic Sitting  BP- Sitting 117/65  Pulse- Sitting 67  Orthostatic Standing at 0 minutes  BP- Standing at 0 minutes 103/68  Pulse- Standing at 0 minutes 69  Orthostatic Standing at 3 minutes  BP- Standing at 3 minutes 114/68  Pulse- Standing at 3 minutes 68       05/03/23 0001  Vestibular Assessment  General Observation resting comfortably in bed.  Symptom Behavior  Subjective history of current problem Reports Lt ear infection about 1 month ago, tx with abx. Infeciton  resolved but still has aural fullness. DIzziness began around the same time and has not improved. Worse with changes in position, described as room spinning and feeling wobbly, lasting several minutes before resolving. Has not seen ENT but states she was supposed to visit. Also reports hx of orthostatic hypotension.  Type of Dizziness  Spinning;Comment (and wobbly)  Frequency of Dizziness Daily  Duration of Dizziness minutes  Symptom Nature Positional  Aggravating Factors Supine to sit;Sit to stand  Relieving Factors No known relieving factors  Progression of Symptoms No change since onset  Oculomotor Exam  Oculomotor Alignment Normal  Spontaneous Absent  Gaze-induced  Age appropriate nystagmus at end range  Saccades Intact  Auditory  Comments Subjective aural fullness Lt>Rt  Positional Testing  Dix-Hallpike Dix-Hallpike Right;Dix-Hallpike Left  Horizontal Canal Testing Horizontal Canal Right;Horizontal Canal Left  Dix-Hallpike Right  Dix-Hallpike Right Duration neg  Dix-Hallpike Right Symptoms No nystagmus  Dix-Hallpike Left  Dix-Hallpike Left Duration neg  Dix-Hallpike Left Symptoms No nystagmus  Horizontal Canal Right  Horizontal Canal Right Duration neg  Horizontal Canal Right Symptoms Normal  Horizontal Canal Left  Horizontal Canal Left Duration neg  Horizontal Canal Left Symptoms Normal  Positional Sensitivities  Sit to Supine 0  Supine to Sitting 2  Up from Right Hallpike 0  Up from Left Hallpike 0  Rolling Right 0  Rolling Left 0            If plan is discharge home, recommend the following: A little help with walking and/or transfers;A little help with bathing/dressing/bathroom;Assistance with cooking/housework;Assist for transportation;Help with stairs or ramp for entrance   Can travel by private vehicle  Equipment Recommendations Rolling walker (2 wheels)  Recommendations for Other Services       Functional Status Assessment Patient has had a  recent decline in their functional status and demonstrates the ability to make significant improvements in function in a reasonable and predictable amount of time.     Precautions / Restrictions Precautions Precautions: Fall Restrictions Weight Bearing Restrictions: No      Mobility  Bed Mobility Overal bed mobility: Needs Assistance Bed Mobility: Supine to Sit, Sit to Supine     Supine to sit: Modified independent (Device/Increase time)     General bed mobility comments: Mod I extra time, no physical assist with transitions. Subjective mild dizziness upon sitting.    Transfers Overall transfer level: Needs assistance Equipment used: Rolling walker (2 wheels) Transfers: Sit to/from Stand Sit to Stand: Supervision           General transfer comment: Supervision for safety. Required 2 attempts due to posterior instability. Successful on second try. Cues for technique.    Ambulation/Gait Ambulation/Gait assistance: Supervision Gait Distance (Feet): 85 Feet Assistive device: Rolling walker (2 wheels) Gait Pattern/deviations: Step-through pattern, Decreased stride length, Drifts right/left Gait velocity: decr Gait velocity interpretation: <1.31 ft/sec, indicative of household ambulator   General Gait Details: Mild instability noted. Educated on safe AD use with RW. Declines to try amb without AD but suspect balance would be further impaired. No overt buckling or LOB with this device. Cues for awareness.  Stairs            Wheelchair Mobility     Tilt Bed    Modified Rankin (Stroke Patients Only)       Balance Overall balance assessment: Needs assistance Sitting-balance support: No upper extremity supported, Feet supported Sitting balance-Leahy Scale: Good     Standing balance support: No upper extremity supported, During functional activity Standing balance-Leahy Scale: Fair Standing balance comment: Stable with RW for support, mild sway noted.                              Pertinent Vitals/Pain Pain Assessment Pain Assessment: Faces Faces Pain Scale: Hurts a little bit Pain Location: buttocks Pain Descriptors / Indicators: Aching Pain Intervention(s): Limited activity within patient's tolerance, Monitored during session    Home Living Family/patient expects to be discharged to:: Private residence Living Arrangements: Children (son) Available Help at Discharge: Family;Available 24 hours/day Type of Home: Mobile home Home Access: Stairs to enter Entrance Stairs-Rails: Right;Left Entrance Stairs-Number of Steps: 3   Home Layout: One level Home Equipment: None      Prior Function Prior Level of Function : Independent/Modified Independent;History of Falls (last six months)               ADLs Comments: ind     Extremity/Trunk Assessment   Upper Extremity Assessment Upper Extremity Assessment: Defer to OT evaluation    Lower Extremity Assessment Lower Extremity Assessment: Generalized weakness       Communication   Communication Communication: No apparent difficulties Cueing Techniques: Verbal cues  Cognition Arousal: Alert Behavior During Therapy: WFL for tasks assessed/performed Overall Cognitive Status: Within Functional Limits for tasks assessed                                          General Comments General comments (skin integrity, edema, etc.): Subjective vertigo; states this  caused her fall. See vestibular assessment; no objective findings this date. Orthostatics taken as well.    Exercises     Assessment/Plan    PT Assessment Patient needs continued PT services  PT Problem List Decreased strength;Decreased activity tolerance;Decreased balance;Decreased mobility;Decreased cognition;Decreased knowledge of use of DME       PT Treatment Interventions DME instruction;Gait training;Stair training;Functional mobility training;Therapeutic activities;Therapeutic  exercise;Balance training;Neuromuscular re-education;Patient/family education;Cognitive remediation;Canalith reposition    PT Goals (Current goals can be found in the Care Plan section)  Acute Rehab PT Goals Patient Stated Goal: Feel better PT Goal Formulation: With patient Time For Goal Achievement: 05/17/23 Potential to Achieve Goals: Good    Frequency Min 1X/week     Co-evaluation               AM-PAC PT "6 Clicks" Mobility  Outcome Measure Help needed turning from your back to your side while in a flat bed without using bedrails?: None Help needed moving from lying on your back to sitting on the side of a flat bed without using bedrails?: None Help needed moving to and from a bed to a chair (including a wheelchair)?: A Little Help needed standing up from a chair using your arms (e.g., wheelchair or bedside chair)?: A Little Help needed to walk in hospital room?: A Little Help needed climbing 3-5 steps with a railing? : A Little 6 Click Score: 20    End of Session Equipment Utilized During Treatment: Gait belt Activity Tolerance: Patient tolerated treatment well Patient left: in bed;with call bell/phone within reach;with nursing/sitter in room   PT Visit Diagnosis: Unsteadiness on feet (R26.81);Other abnormalities of gait and mobility (R26.89);Muscle weakness (generalized) (M62.81);History of falling (Z91.81);Difficulty in walking, not elsewhere classified (R26.2);Other symptoms and signs involving the nervous system (R29.898)    Time: 4098-1191 PT Time Calculation (min) (ACUTE ONLY): 27 min   Charges:   PT Evaluation $PT Eval Low Complexity: 1 Low PT Treatments $Therapeutic Activity: 8-22 mins PT General Charges $$ ACUTE PT VISIT: 1 Visit         Kathlyn Sacramento, PT, DPT [facility]  Rehabilitation Services Physical Therapist Office: [phone] Website: [email or web address]   Berton Mount 05/03/2023, 10:24 AM

## 2023-05-04 ENCOUNTER — Other Ambulatory Visit: Payer: Self-pay

## 2023-05-04 ENCOUNTER — Encounter: Admission: RE | Payer: Self-pay | Source: Home / Self Care

## 2023-05-04 ENCOUNTER — Inpatient Hospital Stay
Admission: AD | Admit: 2023-05-04 | Discharge: 2023-05-10 | DRG: 885 | Disposition: A | Discharge status: Home / Self Care | Payer: Medicare HMO | Attending: Psychiatry | Admitting: Psychiatry

## 2023-05-04 ENCOUNTER — Ambulatory Visit: Admission: RE | Admit: 2023-05-04 | Payer: Medicare HMO | Source: Home / Self Care | Admitting: Orthopedic Surgery

## 2023-05-04 DIAGNOSIS — F331 Major depressive disorder, recurrent, moderate: Secondary | ICD-10-CM | POA: Diagnosis not present

## 2023-05-04 DIAGNOSIS — G459 Transient cerebral ischemic attack, unspecified: Principal | ICD-10-CM

## 2023-05-04 DIAGNOSIS — R471 Dysarthria and anarthria: Secondary | ICD-10-CM | POA: Diagnosis not present

## 2023-05-04 DIAGNOSIS — Z5941 Food insecurity: Secondary | ICD-10-CM

## 2023-05-04 DIAGNOSIS — Z1152 Encounter for screening for COVID-19: Secondary | ICD-10-CM | POA: Diagnosis not present

## 2023-05-04 DIAGNOSIS — J449 Chronic obstructive pulmonary disease, unspecified: Secondary | ICD-10-CM | POA: Diagnosis not present

## 2023-05-04 DIAGNOSIS — R45851 Suicidal ideations: Secondary | ICD-10-CM | POA: Diagnosis present

## 2023-05-04 DIAGNOSIS — Z602 Problems related to living alone: Secondary | ICD-10-CM | POA: Diagnosis present

## 2023-05-04 DIAGNOSIS — F32A Depression, unspecified: Secondary | ICD-10-CM | POA: Diagnosis not present

## 2023-05-04 DIAGNOSIS — Z634 Disappearance and death of family member: Secondary | ICD-10-CM | POA: Diagnosis not present

## 2023-05-04 DIAGNOSIS — Z8542 Personal history of malignant neoplasm of other parts of uterus: Secondary | ICD-10-CM | POA: Diagnosis not present

## 2023-05-04 DIAGNOSIS — Z85038 Personal history of other malignant neoplasm of large intestine: Secondary | ICD-10-CM | POA: Diagnosis not present

## 2023-05-04 DIAGNOSIS — R296 Repeated falls: Secondary | ICD-10-CM | POA: Diagnosis not present

## 2023-05-04 DIAGNOSIS — Z818 Family history of other mental and behavioral disorders: Secondary | ICD-10-CM

## 2023-05-04 DIAGNOSIS — I10 Essential (primary) hypertension: Secondary | ICD-10-CM | POA: Diagnosis not present

## 2023-05-04 DIAGNOSIS — Z79899 Other long term (current) drug therapy: Secondary | ICD-10-CM | POA: Diagnosis not present

## 2023-05-04 DIAGNOSIS — F17210 Nicotine dependence, cigarettes, uncomplicated: Secondary | ICD-10-CM | POA: Diagnosis not present

## 2023-05-04 DIAGNOSIS — E538 Deficiency of other specified B group vitamins: Secondary | ICD-10-CM | POA: Diagnosis not present

## 2023-05-04 DIAGNOSIS — F4310 Post-traumatic stress disorder, unspecified: Secondary | ICD-10-CM | POA: Diagnosis present

## 2023-05-04 DIAGNOSIS — Z7982 Long term (current) use of aspirin: Secondary | ICD-10-CM | POA: Diagnosis not present

## 2023-05-04 DIAGNOSIS — Z885 Allergy status to narcotic agent status: Secondary | ICD-10-CM

## 2023-05-04 DIAGNOSIS — F411 Generalized anxiety disorder: Secondary | ICD-10-CM | POA: Diagnosis present

## 2023-05-04 DIAGNOSIS — R4182 Altered mental status, unspecified: Secondary | ICD-10-CM | POA: Diagnosis not present

## 2023-05-04 SURGERY — ARTHROSCOPY, KNEE, WITH MEDIAL MENISCECTOMY
Anesthesia: Choice | Site: Knee | Laterality: Left

## 2023-05-04 MED ORDER — LATANOPROST 0.005 % OP SOLN
1.0000 [drp] | Freq: Every day | OPHTHALMIC | Status: DC
  Administered 2023-05-04 – 2023-05-09 (×6): 1 [drp] via OPHTHALMIC
  Filled 2023-05-04: qty 2.5

## 2023-05-04 MED ORDER — ENSURE ENLIVE PO LIQD: 237.0000 mL | Freq: Two times a day (BID) | ORAL | Status: DC

## 2023-05-04 MED ORDER — ASPIRIN 81 MG PO TBEC
81.0000 mg | DELAYED_RELEASE_TABLET | Freq: Every day | ORAL | Status: DC
  Administered 2023-05-05 – 2023-05-10 (×6): 81 mg via ORAL
  Filled 2023-05-04 (×6): qty 1

## 2023-05-04 MED ORDER — CYANOCOBALAMIN 1000 MCG PO TABS: 1000.0000 ug | ORAL_TABLET | Freq: Every day | ORAL | Status: DC

## 2023-05-04 MED ORDER — ESCITALOPRAM OXALATE 10 MG PO TABS: 20.0000 mg | ORAL_TABLET | Freq: Every day | ORAL | Status: DC

## 2023-05-04 MED ORDER — HYDROXYZINE HCL 25 MG PO TABS: 25.0000 mg | ORAL_TABLET | Freq: Four times a day (QID) | ORAL | Status: DC | PRN

## 2023-05-04 MED ORDER — SENNOSIDES-DOCUSATE SODIUM 8.6-50 MG PO TABS: 2.0000 | ORAL_TABLET | Freq: Every evening | ORAL | Status: DC | PRN

## 2023-05-04 MED ORDER — SENNOSIDES-DOCUSATE SODIUM 8.6-50 MG PO TABS
2.0000 | ORAL_TABLET | Freq: Two times a day (BID) | ORAL | Status: DC
  Administered 2023-05-04 – 2023-05-08 (×3): 2 via ORAL
  Filled 2023-05-04 (×10): qty 2

## 2023-05-04 MED ORDER — LORAZEPAM 1 MG PO TABS: 2.0000 mg | ORAL_TABLET | Freq: Three times a day (TID) | ORAL | Status: DC | PRN

## 2023-05-04 MED ORDER — ACETAMINOPHEN 325 MG PO TABS
650.0000 mg | ORAL_TABLET | Freq: Four times a day (QID) | ORAL | Status: DC | PRN
  Administered 2023-05-07: 650 mg via ORAL
  Filled 2023-05-04: qty 2

## 2023-05-04 MED ORDER — PANTOPRAZOLE SODIUM 40 MG PO TBEC: 40.0000 mg | DELAYED_RELEASE_TABLET | Freq: Every day | ORAL | Status: DC

## 2023-05-04 MED ORDER — MONTELUKAST SODIUM 10 MG PO TABS
10.0000 mg | ORAL_TABLET | Freq: Every day | ORAL | Status: DC
  Administered 2023-05-05 – 2023-05-10 (×6): 10 mg via ORAL
  Filled 2023-05-04 (×6): qty 1

## 2023-05-04 MED ORDER — VITAMIN B-12 1000 MCG PO TABS
1000.0000 ug | ORAL_TABLET | Freq: Every day | ORAL | Status: DC
  Administered 2023-05-04 – 2023-05-10 (×7): 1000 ug via ORAL
  Filled 2023-05-04 (×7): qty 1

## 2023-05-04 MED ORDER — MAGNESIUM HYDROXIDE 400 MG/5ML PO SUSP: 30.0000 mL | Freq: Every day | ORAL | Status: DC | PRN

## 2023-05-04 MED ORDER — HALOPERIDOL 5 MG PO TABS: 5.0000 mg | ORAL_TABLET | Freq: Three times a day (TID) | ORAL | Status: DC | PRN

## 2023-05-04 MED ORDER — ATORVASTATIN CALCIUM 10 MG PO TABS
40.0000 mg | ORAL_TABLET | Freq: Every day | ORAL | Status: DC
  Administered 2023-05-05 – 2023-05-10 (×6): 40 mg via ORAL
  Filled 2023-05-04 (×6): qty 4

## 2023-05-04 MED ORDER — PANTOPRAZOLE SODIUM 40 MG PO TBEC
40.0000 mg | DELAYED_RELEASE_TABLET | Freq: Every day | ORAL | Status: DC
  Administered 2023-05-05 – 2023-05-10 (×6): 40 mg via ORAL
  Filled 2023-05-04 (×6): qty 1

## 2023-05-04 MED ORDER — MIRTAZAPINE 15 MG PO TABS
7.5000 mg | ORAL_TABLET | Freq: Every day | ORAL | Status: DC
  Administered 2023-05-04 – 2023-05-09 (×6): 7.5 mg via ORAL
  Filled 2023-05-04 (×6): qty 1

## 2023-05-04 MED ORDER — THIAMINE MONONITRATE 100 MG PO TABS
100.0000 mg | ORAL_TABLET | Freq: Every day | ORAL | Status: DC
  Administered 2023-05-04 – 2023-05-10 (×7): 100 mg via ORAL
  Filled 2023-05-04 (×7): qty 1

## 2023-05-04 MED ORDER — MIRTAZAPINE 7.5 MG PO TABS: 7.5000 mg | ORAL_TABLET | Freq: Every day | ORAL | Status: DC

## 2023-05-04 MED ORDER — ESCITALOPRAM OXALATE 10 MG PO TABS
20.0000 mg | ORAL_TABLET | Freq: Every day | ORAL | Status: DC
  Administered 2023-05-05 – 2023-05-09 (×5): 20 mg via ORAL
  Filled 2023-05-04 (×6): qty 2

## 2023-05-04 MED ORDER — ATORVASTATIN CALCIUM 40 MG PO TABS: 40.0000 mg | ORAL_TABLET | Freq: Every day | ORAL | Status: DC

## 2023-05-04 MED ORDER — LORAZEPAM 2 MG/ML IJ SOLN: 2.0000 mg | Freq: Three times a day (TID) | INTRAMUSCULAR | Status: DC | PRN

## 2023-05-04 MED ORDER — HALOPERIDOL LACTATE 5 MG/ML IJ SOLN: 5.0000 mg | Freq: Three times a day (TID) | INTRAMUSCULAR | Status: DC | PRN

## 2023-05-04 MED ORDER — ASPIRIN 81 MG PO TBEC: 81.0000 mg | DELAYED_RELEASE_TABLET | Freq: Every day | ORAL | Status: DC

## 2023-05-04 NOTE — Progress Notes (Signed)
   05/04/23 2203  Psych Admission Type (Psych Patients Only)  Admission Status Voluntary  Psychosocial Assessment  Patient Complaints Anxiety;Irritability  Eye Contact Fair  Facial Expression Anxious  Affect Anxious  Speech Logical/coherent  Interaction Assertive  Motor Activity Slow  Appearance/Hygiene Unremarkable  Behavior Characteristics Cooperative  Mood Anxious  Thought Process  Coherency WDL  Content WDL  Delusions None reported or observed  Perception WDL  Hallucination None reported or observed  Judgment Impaired  Confusion None  Danger to Self  Current suicidal ideation? Denies

## 2023-05-04 NOTE — Plan of Care (Signed)
  Problem: Nutrition: Goal: Adequate nutrition will be maintained Outcome: Progressing   Problem: Coping: Goal: Level of anxiety will decrease Outcome: Not Progressing   

## 2023-05-04 NOTE — Progress Notes (Signed)
Physical Therapy Treatment Patient Details Name: Julia Osborne MRN: [id number] DOB: 01/10/1961 Today's Date: 05/04/2023   History of Present Illness 62 yo female presented 8/11 with altered mental status and speech changes. MRI negative intracranial abnormality. EEG within normal limits. PMHx: HTN, anxiety, COPD, chronic pain, colon/endometrial cancer, glaucoma.    PT Comments  Patient received in bathroom, nurse present in room. She requires increased time with activities. She was able to stand at sink, brush teeth, wash face, mobilizing in room with supervision only and no AD. She stood and changed upper body clothing. No assistance needed. Patient then ambulated 150 feet with supervision to min guard and ambulated up/down ~12 steps with railing and supervision. Patient will benefit from follow up PT for balance and strengthening for fall prevention.     If plan is discharge home, recommend the following: A little help with bathing/dressing/bathroom;A little help with walking and/or transfers;Assist for transportation;Help with stairs or ramp for entrance   Can travel by private vehicle        Equipment Recommendations  Rolling walker (2 wheels)    Recommendations for Other Services       Precautions / Restrictions Precautions Precautions: Fall Restrictions Weight Bearing Restrictions: No     Mobility  Bed Mobility               General bed mobility comments: NT, patient in bathroom with nursing present on arrival and then patient left in recliner    Transfers Overall transfer level: Needs assistance Equipment used: None Transfers: Sit to/from Stand Sit to Stand: Supervision                Ambulation/Gait Ambulation/Gait assistance: Supervision, Contact guard assist Gait Distance (Feet): 150 Feet Assistive device: None Gait Pattern/deviations: Step-through pattern, Decreased step length - right, Decreased step length - left, Narrow base of support Gait  velocity: decr     General Gait Details: mild instability noted without AD. Patient was not using AD when up with nursing in room and was demonstrating good balance with supervision. Once out in hallway she has mild instability, and with history of falls per her report she would benefit from use of AD.   Stairs Stairs: Yes Stairs assistance: Supervision Stair Management: Two rails, One rail Right, Alternating pattern, Forwards Number of Stairs: 12 General stair comments: safe on steps   Wheelchair Mobility     Tilt Bed    Modified Rankin (Stroke Patients Only)       Balance Overall balance assessment: Modified Independent Sitting-balance support: Feet supported Sitting balance-Leahy Scale: Normal     Standing balance support: No upper extremity supported, During functional activity Standing balance-Leahy Scale: Good Standing balance comment: stable static standing in room tending to self care tasks. Able to dress upper body in standing with supervision only.  Mild instability with gait.                            Cognition Arousal: Alert Behavior During Therapy: Flat affect, WFL for tasks assessed/performed Overall Cognitive Status: Within Functional Limits for tasks assessed                                          Exercises      General Comments        Pertinent Vitals/Pain Pain Assessment Pain Assessment: No/denies pain  Home Living                          Prior Function            PT Goals (current goals can now be found in the care plan section) Acute Rehab PT Goals Patient Stated Goal: Feel better, go home PT Goal Formulation: With patient Time For Goal Achievement: 05/17/23 Potential to Achieve Goals: Good Progress towards PT goals: Progressing toward goals    Frequency    Min 1X/week      PT Plan      Co-evaluation              AM-PAC PT "6 Clicks" Mobility   Outcome Measure  Help  needed turning from your back to your side while in a flat bed without using bedrails?: None Help needed moving from lying on your back to sitting on the side of a flat bed without using bedrails?: None Help needed moving to and from a bed to a chair (including a wheelchair)?: None Help needed standing up from a chair using your arms (e.g., wheelchair or bedside chair)?: None Help needed to walk in hospital room?: None Help needed climbing 3-5 steps with a railing? : A Little 6 Click Score: 23    End of Session   Activity Tolerance: Patient tolerated treatment well Patient left: in chair;with call bell/phone within reach;with chair alarm set Nurse Communication: Mobility status PT Visit Diagnosis: Unsteadiness on feet (R26.81);Difficulty in walking, not elsewhere classified (R26.2)     Time: 5409-8119 PT Time Calculation (min) (ACUTE ONLY): 33 min  Charges:    $Gait Training: 8-22 mins $Therapeutic Activity: 8-22 mins PT General Charges $$ ACUTE PT VISIT: 1 Visit                      , PT, [facility] 05/04/23,11:00 AM

## 2023-05-04 NOTE — Plan of Care (Signed)
  Problem: Education: Goal: Knowledge of General Education information will improve Description: Including pain rating scale, medication(s)/side effects and non-pharmacologic comfort measures Outcome: Progressing   Problem: Clinical Measurements: Goal: Ability to maintain clinical measurements within normal limits will improve Outcome: Progressing Goal: Will remain free from infection Outcome: Progressing Goal: Diagnostic test results will improve Outcome: Progressing Goal: Respiratory complications will improve Outcome: Progressing Goal: Cardiovascular complication will be avoided Outcome: Progressing   Problem: Activity: Goal: Risk for activity intolerance will decrease Outcome: Progressing   Problem: Coping: Goal: Level of anxiety will decrease Outcome: Progressing   Problem: Elimination: Goal: Will not experience complications related to bowel motility Outcome: Progressing Goal: Will not experience complications related to urinary retention Outcome: Progressing   Problem: Pain Managment: Goal: General experience of comfort will improve Outcome: Progressing   Problem: Safety: Goal: Ability to remain free from injury will improve Outcome: Progressing   Problem: Skin Integrity: Goal: Risk for impaired skin integrity will decrease Outcome: Progressing   Problem: Health Behavior/Discharge Planning: Goal: Ability to manage health-related needs will improve Outcome: Not Progressing   Problem: Nutrition: Goal: Adequate nutrition will be maintained Outcome: Not Progressing   

## 2023-05-04 NOTE — Progress Notes (Signed)
Patient arrived on [facility] as voluntary admit for major depression and suicidal ideations. States she does not want to die but has had these thoughts lately.  States she is stressed, anxious, worries about how she can take care of her dogs. Depressed that her husband died 2 years ago. Denies H/I and AVH, is A&Ox4, has a steady gait but recent fall in the last 6 months - she states due to 30lb weight loss, not eating or sleeping due to worry and depression.  She is calm, cooperative with staff, interacting with peers.  Will continue to monitor.

## 2023-05-04 NOTE — Discharge Instructions (Addendum)
[name],  You came to the hospital for multiple falls in the past three weeks, weakness, change in your speech the morning of coming to ED. We initially thought you had a stroke, so we did imaging and checked your labs, they were negative. You were found to have severe Vitamin B12 deficiency, so we treated you with nutritional supplementations. We also think that you might have had a "mini-stroke" because of your change in speech.   For your "mini-stroke": We started you on the following medications: - Aspirin 81 mg, take one tablet by mouth daily  - Lipitor (atorvastatin) 40 mg by mouth daily  For your nutritional deficiency:  We started you on the following medication - Cyanocobalamin 1000 mcg, one tablet by mouth daily  - Feeding supplements, drink it two times daily with meals - Please make sure you eat three meals a day and drink WATER. It is very important for you to take care of your health.   For your Major Depression/Anxiety/PTSD These medications might change per your Psychiatrist after discharge from [facility] We have started you on the following medications - Mirtazapine 7.5 mg, one tablet by mouth at bedtime - Hydroxyzine 25 mg, take one tablet by mouth every six hours as needed for anxiety   Continue the following medication: - Lexipro (escitalopram) 20 mg, take one tablet by mouth daily  STOP taking the following medication: - Trazodone 50 mg tablets  - Gabapentin 100 mg tablets   For your COPD - Continue your albuterol inhaler STOP Montelukast (Singulair) tablets   For your Glaucoma - Continue Lumigan eye drops and Simbrinza eye drops   Constipation We started you on the following medication - Senna-docusate 8.6-50 mg, take 2 tablets by mouth at bedtime as needed for constipation   For your GERD We started you on the following medication - Protonix (Pantoprazole) 40 mg , take one tablet by mouth daily   STOP taking Ciprofloxacin!   If you have any of  these following symptoms, please call us or seek care at an emergency department: -Chest Pain -Difficulty Breathing -Worsening abdominal pain -Syncope (passing out) -Drooping of face -Slurred speech -Sudden weakness in your leg or arm -Fever -Chills -blood in the stool -dark black, sticky stool  I am glad you are feeling better. It was a pleasure taking care for you. I wish a good recovery and good health!

## 2023-05-04 NOTE — TOC Transition Note (Signed)
Transition of Care [facility]) - CM/SW Discharge Note   Patient Details  Name: Julia Osborne MRN: [id number] Date of Birth: 07-15-1961  Transition of Care [facility]) CM/SW Contact:  Baldemar Lenis, LCSW Phone Number: 05/04/2023, 2:36 PM   Clinical Narrative:   CSW updated by medical team that patient has been accepted to [facility] gero psych unit. Patient is voluntary. CSW met with patient to sign voluntary consent form and faxed to [facility]. Patient asking if her son would bring her clothes, she hasn't had a clean change of clothes in over 2 days. CSW spoke with son, explained patient transfer, and he contacted neighbor to ask for transportation assistance for patient's belongings. Neighbor can't assist for a few more hours. Son will be able to take patient's belongings to [facility] for her after she has arrived. Patient in agreement. Transport arranged with Juel Burrow.  Nurse to call report to [phone] bed L26.    Final next level of care: Psychiatric Hospital Barriers to Discharge: Barriers Resolved   Patient Goals and CMS Choice CMS Medicare.gov Compare Post Acute Care list provided to:: Patient Choice offered to / list presented to : Patient  Discharge Placement                  Patient to be transferred to facility by: [place] Name of family member notified: Self, Philip Patient and family notified of of transfer: 05/04/23  Discharge Plan and Services Additional resources added to the After Visit Summary for     Discharge Planning Services: CM Consult Post Acute Care Choice: Home Health, Durable Medical Equipment          DME Arranged: Dan Humphreys rolling DME Agency: [employer] Date DME Agency Contacted: 05/03/23   Representative spoke with at DME Agency: Zack HH Arranged: PT, OT, Social Work [employer] Agency: [facility] Date [facility] Agency Contacted: 05/03/23   Representative spoke with at [facility] Agency: Kandee Keen  Social Determinants of Health (SDOH) Interventions SDOH Screenings   Food  Insecurity: No Food Insecurity (05/02/2023)  Housing: Patient Declined (05/02/2023)  Transportation Needs: No Transportation Needs (05/02/2023)  Utilities: Not At Risk (05/02/2023)  Tobacco Use: Low Risk  (05/02/2023)     Readmission Risk Interventions     No data to display
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
## 2023-05-04 NOTE — Group Note (Signed)
Date:  05/04/2023 Time:  10:25 PM  Group Topic/Focus:  Building Self Esteem:   The Focus of this group is helping patients become aware of the effects of self-esteem on their lives, the things they and others do that enhance or undermine their self-esteem, seeing the relationship between their level of self-esteem and the choices they make and learning ways to enhance self-esteem. Coping With Mental Health Crisis:   The purpose of this group is to help patients identify strategies for coping with mental health crisis.  Group discusses possible causes of crisis and ways to manage them effectively. Conflict Resolution:   The focus of this group is to discuss the conflict resolution process and how it may be used upon discharge. Crisis Planning:   The purpose of this group is to help patients create a crisis plan for use upon discharge or in the future, as needed. Developing a Wellness Toolbox:   The focus of this group is to help patients develop a "wellness toolbox" with skills and strategies to promote recovery upon discharge. Making Healthy Choices:   The focus of this group is to help patients identify negative/unhealthy choices they were using prior to admission and identify positive/healthier coping strategies to replace them upon discharge. Self Care:   The focus of this group is to help patients understand the importance of self-care in order to improve or restore emotional, physical, spiritual, interpersonal, and financial health.    Participation Level:  Did Not Attend  Participation Quality:   Did Not Attend  Affect:   Did Not Attend  Cognitive:   Did Not Attend  Insight: None  Engagement in Group:   Did Not Attend  Modes of Intervention:   Did Not Attend  Additional Comments:    Maeola Harman 05/04/2023, 10:25 PM

## 2023-05-04 NOTE — Discharge Summary (Addendum)
Name: Julia Osborne MRN: [id number] DOB: [birth year] 62 y.o. PCP: Ailene Ravel, MD  Date of Admission: 05/02/2023 10:18 AM Date of Discharge:  05/04/2023 Attending Physician: Dr. Heide Spark  DISCHARGE DIAGNOSIS:  Primary Problem: TIA (transient ischemic attack)   Hospital Problems: Principal Problem:   TIA (transient ischemic attack) Active Problems:   Dysarthria    DISCHARGE MEDICATIONS:   Allergies as of 05/04/2023       Reactions   Codeine Nausea And Vomiting, Rash        Medication List     STOP taking these medications    ciprofloxacin 500 MG tablet Commonly known as: CIPRO   gabapentin 100 MG capsule Commonly known as: NEURONTIN   montelukast 10 MG tablet Commonly known as: SINGULAIR   Percocet 5-325 MG tablet Generic drug: oxyCODONE-acetaminophen   traZODone 50 MG tablet Commonly known as: DESYREL       TAKE these medications    albuterol 108 (90 Base) MCG/ACT inhaler Commonly known as: VENTOLIN HFA Inhale into the lungs.   aspirin EC 81 MG tablet Take 1 tablet (81 mg total) by mouth daily. Swallow whole.   atorvastatin 40 MG tablet Commonly known as: LIPITOR Take 1 tablet (40 mg total) by mouth daily.   cyanocobalamin 1000 MCG tablet Take 1 tablet (1,000 mcg total) by mouth daily.   escitalopram 20 MG tablet Commonly known as: LEXAPRO Take 20 mg by mouth daily.   feeding supplement Liqd Take 237 mLs by mouth 2 (two) times daily between meals.   hydrOXYzine 25 MG tablet Commonly known as: ATARAX Take 1 tablet (25 mg total) by mouth every 6 (six) hours as needed for anxiety.   Lumigan 0.01 % Soln Generic drug: bimatoprost Place 1 drop into the left eye at bedtime.   mirtazapine 7.5 MG tablet Commonly known as: REMERON Take 1 tablet (7.5 mg total) by mouth at bedtime.   ondansetron 4 MG disintegrating tablet Commonly known as: ZOFRAN-ODT Take 4 mg by mouth every 6 (six) hours as needed.   pantoprazole 40 MG  tablet Commonly known as: PROTONIX Take 1 tablet (40 mg total) by mouth daily.   senna-docusate 8.6-50 MG tablet Commonly known as: Senokot-S Take 2 tablets by mouth at bedtime as needed for mild constipation.   Simbrinza 1-0.2 % Susp Generic drug: Brinzolamide-Brimonidine Place 1 drop into the left eye 2 (two) times daily.               Durable Medical Equipment  (From admission, onward)           Start     Ordered   05/03/23 1245  For home use only DME Walker rolling  Once       Question Answer Comment  Walker: With 5 Inch Wheels   Patient needs a walker to treat with the following condition Weakness      05/03/23 1244            DISPOSITION AND FOLLOW-UP:  Julia Osborne was discharged from [facility] in Stable condition. At the hospital follow up visit please address:  TIA: Patient is discharged on aspirin 81 mg daily and Lipitor 40 mg daily. Please monitor for any signs or symptoms of change in speech or neuro deficits. Physical deconditioning/frequent falls/malnutrition: Patient is discharged on cyanocobalamin 1000 mg tablets and feeding supplements. Please follow up on her Vit D and B1 levels. Please also follow up on her BMP, she had a mild increase in Cr. Please encourage p.o.  intake.  Major depressive disorder/PTSD/anxiety: Patient is discharged on hydroxyzine 25 mg, mirtazapine 7.5 mg and Lexapro 20 mg.  We stopped her trazodone 50 mg for adverse effect. These medications might change after she is discharged from [facility].  Please have the patient follow-up with an outpatient psych. COPD: Patient is discharged with home albuterol inhaler.  We stopped her montelukast 10 mg due to blackbox warning for serious behavior and mood related side effects.  Constipation: Patient is discharged on senna docusate. Please monitor for bowel movements. Add extra bowel regimen such as Miralax as needed.  GERD: Patient was discharged with Protonix 40 mg for  acid reflux.  Follow-up Recommendations: Consults: Psychiatrist  Labs:  Vit B12, B1, BMP Studies: None  Medications: Aspirin 81 mg, atorvastatin 40 mg, cyanocobalamin at 1000 mcg, hydroxyzine 25 mg, mirtazapine 7.5 mg, pantoprazole 40 mg, senna docusate.  Follow-up Appointments:  Follow-up Information     [facility] Follow up.   Specialty: Home Health Services Why: The home health agency will contact you for the first home visit. Contact information: [address] [place] [address] [phone]                 HOSPITAL COURSE:  #Major Depressive Disorder #Generalized Anxiety Disorder #PTSD Patient with depressed mood for the past couple of weeks. She reports she does not feel like getting up from bed. She feels tired, says "I want to quit". She has decrease PO intake, not eating and drinking well lately. She reported suicidal ideations at home, but no plan. Currently, no thoughts of hurting herself. We continued her home dose Lexapro 20 mg daily, trazodone 50 mg at bedtime. Psych was consulted for evaluation and recommended admission to inpatient psych. Per psych, patient was started on hydroxyzine 25 mg and mirtazapine 7.5 mg.  Patient was complaining of dizziness and wooziness.  We stopped her trazodone at bedtime. Patient feels comfortable going to inpatient psych for further treatment.  #Dysarthria #AMS #Recurrent falls, physical deconditioning Patient presented confusion, nonsensical speech and LKN 2100 last night, multiple falls and confusion. Patient reported associated symptoms of dizziness, fatigue, and generalized weakness over the course of couple of weeks. Differentials include TIA/stroke vs seizures vs electrolyte disturbances, Infectious, structural, and toxins/medications. Patient is alert and oriented x4 with no focal deficits. She answered questions appropriately, coherent and intact speech. No limb weakness or drift noted. BMP  remarkable for low K 3.3, otherwise within normal range. CBC unremarkable, no leukocytosis or anemia. UA negative for leukocytes and nitrites, no bacteria or WBC noted. CT head negative for structural causes, no evidence of prior infarcts. MRI negative intracranial abnormality. EEG within normal limits, no seizures or epileptiform discharges noted. Negative acetaminophen and salicylate level. Other differentials include hypothyroidism vs B12 deficiencies vs nutritional deficiencies vs infectious TSH normal. HIV antibody negative. RPR negative. Ammonia normal. Lipid panel normal. A1c 5.9. Patient's initial symptoms highly suspicious for possible TIA. For TIA coverage patient was started on aspirin 81 mg and Lipitor 40 mg daily.  SLP eval negative. Patient was found to have B12 level <177. S/p B12 1,000 unit injection. Patient was started on PO 1,000 mcg B12 daily with continuous IV thiamine every 8 hours. We started feeding supplements inpatient. We stopped her gabapentin in the setting of dizziness and history of frequent falls.   COPD Currently not in exacerbation, denies any acute chest pains, shortness of breath, or cough with sputum production. We continue home albuterol inhaler here.  Hypokalemia Presented with potassium of 3.3, patient was given p.o. KCl recheck potassium at 3.7.   Glaucoma We continued home medication Azopt and Alphagan eye drops and Latanoprost opthalmic solution here in the hospital.   Constipation Patient stated that she has not had a bowel movement for the past couple of days, we started patient on docusate BID.    GERD Patient was complaining of acid reflux and indigestion. We started home medication Protonix 40 mg daily to help with acid reflux.  DISCHARGE INSTRUCTIONS:   Discharge Instructions     Ambulatory referral to Physical Therapy   Complete by: As directed    Diet - low sodium heart healthy   Complete by: As directed    Discharge instructions    Complete by: As directed    Julia Osborne,  You came to the hospital for multiple falls in the past three weeks, weakness, change in your speech the morning of coming to ED. We initially thought you had a stroke, so we did imaging and checked your labs, they were negative. You were found to have severe Vitamin B12 deficiency, so we treated you with nutritional supplementations. We also think that you might have had a "mini-stroke" because of your change in speech.   For your "mini-stroke": We started you on the following medications: - Aspirin 81 mg, take one tablet by mouth daily  - Lipitor (atorvastatin) 40 mg by mouth daily  For your nutritional deficiency:  We started you on the following medication - Cyanocobalamin 1000 mcg, one tablet by mouth daily  - Feeding supplements, drink it two times daily with meals - Please make sure you eat three meals a day and drink WATER. It is very important for you to take care of your health.   For your Major Depression/Anxiety/PTSD These medications might change per your Psychiatrist after discharge from [facility] We have started you on the following medications - Mirtazapine 7.5 mg, one tablet by mouth at bedtime - Hydroxyzine 25 mg, take one tablet by mouth every six hours as needed for anxiety   Continue the following medication: - Lexipro (escitalopram) 20 mg, take one tablet by mouth daily  STOP taking the following medication: - Trazodone 50 mg tablets  - Gabapentin 100 mg tablets   For your COPD - Continue your albuterol inhaler STOP Montelukast (Singulair) tablets   For your Glaucoma - Continue Lumigan eye drops and Simbrinza eye drops   Constipation We started you on the following medication - Senna-docusate 8.6-50 mg, take 2 tablets by mouth at bedtime as needed for constipation   For your GERD We started you on the following medication - Protonix (Pantoprazole) 40 mg , take one tablet by mouth daily   STOP taking  Ciprofloxacin!   If you have any of these following symptoms, please call us or seek care at an emergency department: -Chest Pain -Difficulty Breathing -Worsening abdominal pain -Syncope (passing out) -Drooping of face -Slurred speech -Sudden weakness in your leg or arm -Fever -Chills -blood in the stool -dark black, sticky stool  I am glad you are feeling better. It was a pleasure taking care for you. I wish a good recovery and good health!   Increase activity slowly   Complete by: As directed    No wound care   Complete by: As directed        SUBJECTIVE:  Patient working with PT when we visited her this morning. She was standing by the sink grooming herself. Patient appeared  alert and oriented. She was ambulating in the room without assistance. She reported feeling "whoozy" after starting her new psych medications, but apart from that, no other acute concerns. She feels comfortable going to inpatient psych for further treatment. Patient was notified that she is medically stable from our perspective and continue further treatment at inpatient psych.    Discharge Vitals:   BP 118/68 (BP Location: Left Arm)   Pulse (!) 56   Temp 98.5 F (36.9 C) (Oral)   Resp 18   Ht 5\' 3"  (1.6 m)   Wt 52.1 kg   SpO2 98%   BMI 20.35 kg/m   OBJECTIVE:  Physical Exam   Constitutional: cachetic female, standing by the sink grooming herself, answering questions appropriately, coherent intact speech, in no acute distress. HENT: normocephalic atraumatic, mucous membranes moist Cardiovascular: regular rate and rhythm, no m/r/g Pulmonary/Chest: normal work of breathing on room air, lungs clear to auscultation bilaterally Abdominal: soft, non-tender, non-distended, bowel sounds presents  MSK: normal bulk and tone Neurological Exam: Alert and oriented to name, year, location, and event. 5/5 strength upper and lower extremities bilaterally. Lifts all extremities antigravity, no drift noted.    Skin: warm and dry Psych: Normal mood and affect  Pertinent Labs, Studies, and Procedures:     Latest Ref Rng & Units 05/03/2023   12:51 AM 05/02/2023   10:28 AM 05/02/2023   10:19 AM  CBC  WBC 4.0 - 10.5 K/uL 9.4   7.9   Hemoglobin 12.0 - 15.0 g/dL 40.9  81.1  91.4   Hematocrit 36.0 - 46.0 % 32.8  37.0  37.8   Platelets 150 - 400 K/uL 206   203        Latest Ref Rng & Units 05/03/2023   12:51 AM 05/02/2023   10:28 AM 05/02/2023   10:19 AM  CMP  Glucose 70 - 99 mg/dL 782  956  213   BUN 6 - 20 mg/dL 12  4  6    Creatinine 0.44 - 1.00 mg/dL 0.86  5.78  4.69   Sodium 135 - 145 mmol/L 137  141  140   Potassium 3.5 - 5.1 mmol/L 3.7  3.2  3.3   Chloride 98 - 111 mmol/L 103  103  105   CO2 22 - 32 mmol/L 25   26   Calcium 8.9 - 10.3 mg/dL 8.4   8.9   Total Protein 6.5 - 8.1 g/dL   6.1   Total Bilirubin 0.3 - 1.2 mg/dL   0.7   Alkaline Phos 38 - 126 U/L   40   AST 15 - 41 U/L   13   ALT 0 - 44 U/L   8     MR BRAIN W WO CONTRAST  Result Date: 05/02/2023 CLINICAL DATA:  Altered mental status EXAM: MRI HEAD WITHOUT AND WITH CONTRAST TECHNIQUE: Multiplanar, multiecho pulse sequences of the brain and surrounding structures were obtained without and with intravenous contrast. CONTRAST:  5mL GADAVIST GADOBUTROL 1 MMOL/ML IV SOLN COMPARISON:  Same-day CT/CTA head and neck, brain MRI 12/06/2022 FINDINGS: Brain: There is no acute intracranial hemorrhage, extra-axial fluid collection, or acute infarct. Parenchymal volume is normal. The ventricles are normal in size. A small focus of SWI signal dropout in the right centrum semiovale likely reflects a small cavernoma, unchanged. Parenchymal signal is otherwise normal. The pituitary and suprasellar region are normal. There is no mass lesion. There is no mass effect or midline shift. Vascular: Normal flow voids. Skull and upper  cervical spine: Normal marrow signal. Sinuses/Orbits: The paranasal sinuses are clear. The globes and orbits are unremarkable.  Other: The mastoid air cells and middle ear cavities are clear. Somewhat prominent adenoid tonsils with probable midline Tornwaldt cyst is noted, not significantly changed. 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 Otherwise unremarkable brain MRI with no acute intracranial pathology. Electronically Signed   By: Lesia Hausen M.D.   On: 05/02/2023 14:36   EEG adult  Result Date: 05/02/2023 Charlsie Quest, MD     05/02/2023  1:28 PM Patient Name: Raquell Janoff MRN: [id number] Epilepsy Attending: Charlsie Quest Referring Physician/Provider: Gordy Councilman, MD Date: 05/02/2023 Duration: 24.07 mins Patient history: 62 y.o. female  past medical history of anxiety, COPD, chronic pain, colon cancer, glaucoma, ovarian/uterine cancer (2015), and vertigo presenting with altered mental status. EEG to evaluate for seizure. Level of alertness: Awake AEDs during EEG study: None Technical aspects: This EEG study was done with scalp electrodes positioned according to the 10-20 International system of electrode placement. Electrical activity was reviewed with band pass filter of 1-70Hz , sensitivity of 7 uV/mm, display speed of 49mm/sec with a 60Hz  notched filter applied as appropriate. EEG data were recorded continuously and digitally stored.  Video monitoring was available and reviewed as appropriate. Description: The posterior dominant rhythm consists of 9-10 Hz activity of moderate voltage (25-35 uV) seen predominantly in posterior head regions, symmetric and reactive to eye opening and eye closing. Hyperventilation and photic stimulation were not performed.   IMPRESSION: This study is within normal limits. No seizures or epileptiform discharges were seen throughout the recording. A normal interictal EEG does not exclude the diagnosis of epilepsy. Priyanka Annabelle Harman   CT ANGIO HEAD NECK W WO CM (CODE STROKE)  Result Date: 05/02/2023 CLINICAL DATA:  62 year old female code stroke. EXAM: CT  ANGIOGRAPHY HEAD AND NECK WITH AND WITHOUT CONTRAST TECHNIQUE: Multidetector CT imaging of the head and neck was performed using the standard protocol during bolus administration of intravenous contrast. Multiplanar CT image reconstructions and MIPs were obtained to evaluate the vascular anatomy. Carotid stenosis measurements (when applicable) are obtained utilizing NASCET criteria, using the distal internal carotid diameter as the denominator. RADIATION DOSE REDUCTION: This exam was performed according to the departmental dose-optimization program which includes automated exposure control, adjustment of the mA and/or kV according to patient size and/or use of iterative reconstruction technique. CONTRAST:  75mL OMNIPAQUE IOHEXOL 350 MG/ML SOLN COMPARISON:  Head CT 1028 hours today. Prior CTA head and neck 12/06/2022 [facility] FINDINGS: CTA NECK Skeleton: No acute osseous abnormality identified. Upper chest: Right chest Port-A-Cath. Negative visible mediastinum. Visible lungs are clear. Other neck: Slight rightward gaze. No acute soft tissue finding identified in the neck. Aortic arch: Normal 3 vessel arch. Right carotid system: Stable and negative. No significant plaque or stenosis. Left carotid system: Stable and negative. Vertebral arteries: Proximal right subclavian artery and right vertebral artery origin are normal. Right vertebral artery is stable, negative to the skull base. Proximal left subclavian artery and left vertebral artery origin are normal. Codominant left vertebral artery is patent, stable and negative to the skull base. CTA HEAD Posterior circulation: Codominant distal vertebral arteries and vertebrobasilar junction are patent without stenosis. Both PICA origins are normal. Patent basilar artery without stenosis. Fetal type bilateral PCA origins. Patent SCA origins. Bilateral PCA branches are stable and within normal limits. Anterior circulation: Both ICA siphons are patent.  No significant siphon plaque or stenosis. Normal ophthalmic and  posterior communicating artery origins. Patent carotid termini. Normal MCA and ACA origins. Normal anterior communicating artery. Dominant left ACA redemonstrated, normal variant. Bilateral ACA branches are stable and within normal limits. Left MCA M1 segment and bifurcation are patent without stenosis. Right MCA M1 segment and bifurcation are patent without stenosis. Bilateral MCA branches are stable and within normal limits. Venous sinuses: Patent. Anatomic variants: Fetal type bilateral PCA origins. Dominant left ACA. Review of the MIP images confirms the above findings IMPRESSION: Negative for large vessel occlusion. Stable CTA Head and Neck with no significant atherosclerosis or stenosis identified. These results were communicated to Dr. Iver Nestle at 10:44 am on 05/02/2023 by text page via the [facility] messaging system. Electronically Signed   By: Odessa Fleming M.D.   On: 05/02/2023 10:44   CT HEAD CODE STROKE WO CONTRAST  Result Date: 05/02/2023 CLINICAL DATA:  Code stroke.  62 year old female. EXAM: CT HEAD WITHOUT CONTRAST TECHNIQUE: Contiguous axial images were obtained from the base of the skull through the vertex without intravenous contrast. RADIATION DOSE REDUCTION: This exam was performed according to the departmental dose-optimization program which includes automated exposure control, adjustment of the mA and/or kV according to patient size and/or use of iterative reconstruction technique. COMPARISON:  Brain and Head CTMRI 12/06/2022. FINDINGS: Brain: Cerebral volume is stable, normal for age. No midline shift, ventriculomegaly, mass effect, evidence of mass lesion, intracranial hemorrhage or evidence of cortically based acute infarction. Gray-white matter differentiation is within normal limits throughout the brain. Vascular: No suspicious intracranial vascular hyperdensity. Skull: No acute osseous abnormality identified. Sinuses/Orbits:  Visualized paranasal sinuses and mastoids are stable and well aerated. Other: Slight rightward gaze. Visualized scalp soft tissues are within normal limits. ASPECTS [facility] Stroke Program Early CT Score) Total score (0-10 with 10 being normal): 10 IMPRESSION: 1. Stable and normal for age noncontrast CT appearance of the brain. ASPECTS 10. 2. These results were communicated to Dr. Iver Nestle at 10:31 am on 05/02/2023 by text page via the [facility] messaging system. Electronically Signed   By: Odessa Fleming M.D.   On: 05/02/2023 10:31     Signed: Jeral Pinch, D.O.  Internal Medicine Resident, PGY-1 [name]  Pager: [phone] 1:09 PM, 05/04/2023

## 2023-05-04 NOTE — Group Note (Signed)
Recreation Therapy Group Note   Group Topic:Goal Setting  Group Date: 05/04/2023 Start Time:  2:00 PM End Time:  2:55 PM Facilitators: Rosina Lowenstein, LRT, CTRS Location:  Day Room  Group Description: Vision Board. Patients were given many different magazines, a glue stick, markers, and a piece of cardstock paper. LRT and pts discussed the importance of having goals in life. LRT and pts discussed the difference between short-term and long-term goals, as well as what a SMART goal is. LRT encouraged pts to create a vision board, with images they picked and then cut out with safety scissors from the magazine, for themselves, that capture their short and long-term goals. LRT encouraged pts to show and explain their vision board to the group. LRT offered to laminate vision board once dry and complete.   Goal Area(s) Addressed:  Patient will gain knowledge of short vs. long term goals.  Patient will identify goals for themselves. Patient will practice setting SMART goals. Patient will verbalize their goals to LRT and peers.   Affect/Mood: N/A   Participation Level: Did not attend    Clinical Observations/Individualized Feedback: Julia Osborne did not attend group due to not being on the unit yet.   Plan: Continue to engage patient in RT group sessions 2-3x/week.   Rosina Lowenstein, LRT, CTRS 05/04/2023 3:46 PM

## 2023-05-04 NOTE — Tx Team (Signed)
Initial Treatment Plan 05/04/2023 6:19 PM Lillia Corporal ONG:[id number]    PATIENT STRESSORS: Financial difficulties   Health problems     PATIENT STRENGTHS: Ability for insight  Capable of independent living  Communication skills  Motivation for treatment/growth    PATIENT IDENTIFIED PROBLEMS: Financial insecurity  Not eating or sleeping                   DISCHARGE CRITERIA:  Ability to meet basic life and health needs Adequate post-discharge living arrangements Improved stabilization in mood, thinking, and/or behavior  PRELIMINARY DISCHARGE PLAN: Return to previous living arrangement  PATIENT/FAMILY INVOLVEMENT: This treatment plan has been presented to and reviewed with the patient, Julia Osborne.  The patient has been given the opportunity to ask questions and make suggestions.  Lenny Pastel, RN 05/04/2023, 6:19 PM

## 2023-05-05 MED ORDER — TRAZODONE HCL 50 MG PO TABS
25.0000 mg | ORAL_TABLET | Freq: Every evening | ORAL | Status: DC | PRN
  Administered 2023-05-05 – 2023-05-09 (×3): 25 mg via ORAL
  Filled 2023-05-05 (×2): qty 1

## 2023-05-05 MED ORDER — BUSPIRONE HCL 5 MG PO TABS
5.0000 mg | ORAL_TABLET | Freq: Two times a day (BID) | ORAL | Status: DC
  Administered 2023-05-05 – 2023-05-10 (×11): 5 mg via ORAL
  Filled 2023-05-05 (×11): qty 1

## 2023-05-05 MED ORDER — ENSURE ENLIVE PO LIQD
237.0000 mL | Freq: Three times a day (TID) | ORAL | Status: DC
  Administered 2023-05-05 – 2023-05-09 (×6): 237 mL via ORAL

## 2023-05-05 MED ORDER — ADULT MULTIVITAMIN W/MINERALS CH
1.0000 | ORAL_TABLET | Freq: Every day | ORAL | Status: DC
  Administered 2023-05-06 – 2023-05-10 (×5): 1 via ORAL
  Filled 2023-05-05 (×5): qty 1
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
## 2023-05-05 NOTE — Plan of Care (Signed)
  Problem: Education: Goal: Knowledge of General Education information will improve Description: Including pain rating scale, medication(s)/side effects and non-pharmacologic comfort measures Outcome: Not Progressing   Problem: Health Behavior/Discharge Planning: Goal: Ability to manage health-related needs will improve Outcome: Not Progressing   Problem: Clinical Measurements: Goal: Ability to maintain clinical measurements within normal limits will improve Outcome: Not Progressing Goal: Will remain free from infection Outcome: Not Progressing Goal: Diagnostic test results will improve Outcome: Not Progressing Goal: Respiratory complications will improve Outcome: Not Progressing Goal: Cardiovascular complication will be avoided Outcome: Not Progressing   Problem: Activity: Goal: Risk for activity intolerance will decrease Outcome: Not Progressing   

## 2023-05-05 NOTE — Progress Notes (Signed)
Initial Nutrition Assessment  DOCUMENTATION CODES:   Not applicable  INTERVENTION:   Ensure Enlive po TID, each supplement provides 350 kcal and 20 grams of protein.  Magic cup TID with meals, each supplement provides 290 kcal and 9 grams of protein  MVI po daily   Thiamine 100mg  po daily   Pt at high refeed risk  NUTRITION DIAGNOSIS:   Unintentional weight loss related to social / environmental circumstances, cancer and cancer related treatments as evidenced by 17 percent weight loss in 9 months.  GOAL:   Patient will meet greater than or equal to 90% of their needs  MONITOR:   Supplement acceptance, PO intake  REASON FOR ASSESSMENT:   Malnutrition Screening Tool    ASSESSMENT:   62 y/o female with h/o stage IV uterine cancer s/p chemotherapy, lung nodules, PTSD, MDD, TIA, Lynch syndrome, AVMs, chronic pain, colon cancer s/p partial colectomy (2016), COPD, diverticulitis, GERD and hiatal hernia who is admitted with suicidal ideations.  Per chart review, pt reports poor appetite and oral intake at baseline. Pt reports COVID 19 several months ago and reports taste changes and worsening oral intake since then. Pt also reports ongoing depression. Per chart, pt is down 24lbs(17%) since November; this is significant weight loss. RD will add supplements and MVI to help pt meet her estimated needs. Pt is at high refeed risk.   Medications reviewed and include: aspirin, B12, remeron, protonix, senokot, thiamine  Labs reviewed:   Diet Order:   Diet Order             Diet regular Room service appropriate? Yes; Fluid consistency: Thin  Diet effective now                  EDUCATION NEEDS:   No education needs have been identified at this time  Skin:  Skin Assessment: Reviewed RN Assessment (L arm wound)  Last BM:  8/14- constipation- chronic  Height:   Ht Readings from Last 1 Encounters:  05/04/23 5\' 3"  (1.6 m)    Weight:   Wt Readings from Last 1  Encounters:  05/04/23 52.4 kg   BMI:  Body mass index is 20.46 kg/m.  Estimated Nutritional Needs:   Kcal:  1600-1800kcal/day  Protein:  80-90g/day  Fluid:  1.6-1.8L/day  Betsey Holiday MS, RD, LDN Please refer to [facility] for RD and/or RD on-call/weekend/after hours pager

## 2023-05-05 NOTE — Group Note (Signed)
Date:  05/05/2023 Time:  8:43 PM  Group Topic/Focus:  Emotional Education:   The focus of this group is to discuss what feelings/emotions are, and how they are experienced.    Participation Level:  Active  Participation Quality:  Appropriate  Affect:  Appropriate  Cognitive:  Appropriate  Insight: Appropriate  Engagement in Group:  Engaged  Modes of Intervention:  Education  Additional Comments:    Jeannette How 05/05/2023, 8:43 PM

## 2023-05-05 NOTE — Progress Notes (Signed)
   05/05/23 2224  Psych Admission Type (Psych Patients Only)  Admission Status Voluntary  Psychosocial Assessment  Patient Complaints Anxiety  Eye Contact Fair  Facial Expression Anxious  Affect Anxious  Speech Logical/coherent  Interaction Assertive  Motor Activity Slow  Appearance/Hygiene Unremarkable  Behavior Characteristics Cooperative  Mood Labile  Thought Process  Coherency WDL  Content WDL  Delusions None reported or observed  Perception WDL  Hallucination None reported or observed  Judgment Impaired  Confusion None  Danger to Self  Current suicidal ideation? Denies

## 2023-05-05 NOTE — [facility] Suicide Risk Assessment (Signed)
[facility] Admission Suicide Risk Assessment   Nursing information obtained from:  Patient Demographic factors:  Divorced or widowed, Caucasian, Unemployed Current Mental Status:  Suicidal ideation indicated by patient Loss Factors:  Loss of significant relationship Historical Factors:  Anniversary of important loss Risk Reduction Factors:  Religious beliefs about death   Principal Problem: MDD (major depressive disorder), recurrent episode, moderate (HCC) Diagnosis:  Principal Problem:   MDD (major depressive disorder), recurrent episode, moderate (HCC)  Subjective Data: Patient is a 62 year old female with history of depression, anxiety, hypertension, COPD, colon and endometrial cancer in remission who is admitted secondary to suicidal thoughts.  Patient reportedly has history of depression, generalized anxiety disorder, and PTSD.  Continued Clinical Symptoms:  Alcohol Use Disorder Identification Test Final Score (AUDIT): 0 The "Alcohol Use Disorders Identification Test", Guidelines for Use in Primary Care, Second Edition.  World Science writer [facility]). Score between 0-7:  no or low risk or alcohol related problems. Score between 8-15:  moderate risk of alcohol related problems. Score between 16-19:  high risk of alcohol related problems. Score 20 or above:  warrants further diagnostic evaluation for alcohol dependence and treatment.   CLINICAL FACTORS:   Depression:   Anhedonia Hopelessness Insomnia Severe   Musculoskeletal: Strength & Muscle Tone: within normal limits Gait & Station: normal Patient leans: N/A  Psychiatric Specialty Exam:  Presentation  General Appearance:  Appropriate for Environment  Eye Contact: Good  Speech: Clear and Coherent  Speech Volume: Decreased   Mood and Affect  Mood: Anxious; Depressed; Hopeless  Affect: Congruent; Constricted; Restricted   Thought Process  Thought Processes: Coherent; Goal Directed; Linear  Descriptions  of Associations:Intact  Orientation:Full (Time, Place and Person)  Thought Content:Abstract Reasoning; Logical  History of Schizophrenia/Schizoaffective disorder:No  Duration of Psychotic Symptoms:N/A  Hallucinations:Hallucinations: None  Ideas of Reference:None  Suicidal Thoughts:Suicidal Thoughts: Yes, Active SI Active Intent and/or Plan: Without Plan SI Passive Intent and/or Plan: Without Plan  Homicidal Thoughts:Homicidal Thoughts: No   Sensorium  Memory: Immediate Fair; Recent Fair; Remote Fair  Judgment: Fair  Insight: Fair   Chartered certified accountant: Fair  Attention Span: Fair  Recall: [employer] of Knowledge: Fair  Language: Fair   Psychomotor Activity  Psychomotor Activity: Psychomotor Activity: Decreased   Assets  Assets: Communication Skills; Desire for Improvement; Housing   Sleep  Sleep: Sleep: Poor    Physical Exam: Physical Exam Constitutional:      Appearance: Normal appearance.  HENT:     Head: Normocephalic and atraumatic.     Nose: Nose normal.  Eyes:     Pupils: Pupils are equal, round, and reactive to light.  Cardiovascular:     Rate and Rhythm: Normal rate.  Pulmonary:     Effort: Pulmonary effort is normal.  Skin:    General: Skin is warm.  Neurological:     General: No focal deficit present.     Mental Status: She is alert and oriented to person, place, and time.    Review of Systems  Constitutional:  Positive for malaise/fatigue. Negative for chills and fever.  HENT:  Negative for congestion, hearing loss and sinus pain.   Eyes:  Negative for blurred vision and double vision.  Respiratory:  Negative for cough and shortness of breath.   Cardiovascular:  Negative for chest pain and palpitations.  Gastrointestinal:  Negative for nausea and vomiting.  Neurological:  Negative for dizziness, sensory change, speech change and focal weakness.  Psychiatric/Behavioral:  Positive for depression and  suicidal ideas.  The patient is nervous/anxious and has insomnia.    Blood pressure (!) 128/59, pulse 60, temperature (!) 97 F (36.1 C), resp. rate 16, height 5\' 3"  (1.6 m), weight 52.4 kg, SpO2 99%. Body mass index is 20.46 kg/m.   COGNITIVE FEATURES THAT CONTRIBUTE TO RISK:  Thought constriction (tunnel vision)    SUICIDE RISK:   Moderate:  Frequent suicidal ideation with limited intensity, and duration, some specificity in terms of plans, no associated intent, good self-control  PLAN OF CARE: Per H&P  I certify that inpatient services furnished can reasonably be expected to improve the patient's condition.   Lewanda Rife, MD 05/05/2023, 10:46 AM

## 2023-05-05 NOTE — Group Note (Signed)
Date:  05/05/2023 Time:  1:15 PM  Group Topic/Focus:  Coping With Mental Health Crisis:   The purpose of this group is to help patients identify strategies for coping with mental health crisis.  Group discusses possible causes of crisis and ways to manage them effectively. Healthy Communication:   The focus of this group is to discuss communication, barriers to communication, as well as healthy ways to communicate with others. Overcoming Stress:   The focus of this group is to define stress and help patients assess their triggers.  We went outside to get some fresh air and we talked about healthy ways to cope with stress and mental health issues.     Participation Level:  Active  Participation Quality:  Appropriate  Affect:  Appropriate  Cognitive:  Appropriate  Insight: Appropriate  Engagement in Group:  Engaged  Modes of Intervention:  Discussion  Additional Comments:     L  05/05/2023, 1:15 PM

## 2023-05-05 NOTE — Plan of Care (Signed)
  Problem: Clinical Measurements: Goal: Respiratory complications will improve Outcome: Progressing   
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
## 2023-05-05 NOTE — Group Note (Signed)
Recreation Therapy Group Note   Group Topic:Self-Esteem  Group Date: 05/05/2023 Start Time: 1400 End Time: 1500 Facilitators: Rosina Lowenstein, LRT, CTRS Location:  Day room  Group Description: Positive Focus. Patients are given a handout that has 9 different boxes on it. Each box has a different prompt on it that requires you to identify and add something positive about themselves. LRT encourages pts to share at least two of their boxes to the group. LRT and pts discuss the importance of "thinking positive", self-esteem and how they can apply it to their everyday life post-discharge. After completing worksheet, patients played Positive Affirmation Bingo and won stress balls as prizes.   Goal Area(s) Addressed: Patient will identify positive qualities about themselves. Patient will identify definition of "self-esteem". Patients will identify the difference between positive and negative self-esteem.  Patient will recite positive qualities and affirmations aloud to the group.   Affect/Mood: Appropriate   Participation Level: Active and Engaged   Participation Quality: Independent   Behavior: Appropriate, Calm, and Cooperative   Speech/Thought Process: Coherent   Insight: Good   Judgement: Good   Modes of Intervention: Activity and Worksheet   Patient Response to Interventions:  Attentive, Engaged, Interested , and Receptive   Education Outcome:  Acknowledges education   Clinical Observations/Individualized Feedback: Julia Osborne was active in their participation of session activities and group discussion. Pt identified "I was proud when my husband and I bought out first home. A way I stay healthy is to eat and drink water. 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 Pt interacted well with LRT and peers duration of session.   Plan: Continue to engage patient in RT group sessions 2-3x/week.   [address], LRT, CTRS 05/05/2023 3:24 PM

## 2023-05-05 NOTE — BH IP Treatment Plan (Signed)
Interdisciplinary Treatment and Diagnostic Plan Update  05/05/2023 Time of Session: 9:45AM Julia Osborne MRN: [id number]  Principal Diagnosis: MDD (major depressive disorder), recurrent episode, moderate (HCC)  Secondary Diagnoses: Principal Problem:   MDD (major depressive disorder), recurrent episode, moderate (HCC)   Current Medications:  Current Facility-Administered Medications  Medication Dose Route Frequency Provider Last Rate Last Admin   acetaminophen (TYLENOL) tablet 650 mg  650 mg Oral Q6H PRN Starkes-Perry, Juel Burrow, FNP       aspirin EC tablet 81 mg  81 mg Oral Daily Starkes-Perry, Takia S, FNP       atorvastatin (LIPITOR) tablet 40 mg  40 mg Oral Daily Starkes-Perry, Takia S, FNP       cyanocobalamin (VITAMIN B12) tablet 1,000 mcg  1,000 mcg Oral Daily Maryagnes Amos, FNP   1,000 mcg at 05/04/23 2110   escitalopram (LEXAPRO) tablet 20 mg  20 mg Oral QHS Dixon, Rashaun M, NP       haloperidol (HALDOL) tablet 5 mg  5 mg Oral TID PRN Maryagnes Amos, FNP       Or   haloperidol lactate (HALDOL) injection 5 mg  5 mg Intramuscular TID PRN Starkes-Perry, Juel Burrow, FNP       hydrOXYzine (ATARAX) tablet 25 mg  25 mg Oral Q6H PRN Starkes-Perry, Juel Burrow, FNP       latanoprost (XALATAN) 0.005 % ophthalmic solution 1 drop  1 drop Left Eye QHS Maryagnes Amos, FNP   1 drop at 05/04/23 2110   LORazepam (ATIVAN) tablet 2 mg  2 mg Oral TID PRN Maryagnes Amos, FNP       Or   LORazepam (ATIVAN) injection 2 mg  2 mg Intramuscular TID PRN Starkes-Perry, Juel Burrow, FNP       magnesium hydroxide (MILK OF MAGNESIA) suspension 30 mL  30 mL Oral Daily PRN Starkes-Perry, Juel Burrow, FNP       mirtazapine (REMERON) tablet 7.5 mg  7.5 mg Oral QHS Maryagnes Amos, FNP   7.5 mg at 05/04/23 2110   montelukast (SINGULAIR) tablet 10 mg  10 mg Oral Daily Starkes-Perry, Takia S, FNP       pantoprazole (PROTONIX) EC tablet 40 mg  40 mg Oral QAC breakfast Starkes-Perry, Juel Burrow,  FNP       senna-docusate (Senokot-S) tablet 2 tablet  2 tablet Oral BID Maryagnes Amos, FNP   2 tablet at 05/04/23 2110   thiamine (VITAMIN B1) tablet 100 mg  100 mg Oral Daily Maryagnes Amos, FNP   100 mg at 05/04/23 2109   PTA Medications: Medications Prior to Admission  Medication Sig Dispense Refill Last Dose   albuterol (VENTOLIN HFA) 108 (90 Base) MCG/ACT inhaler Inhale into the lungs.      aspirin EC 81 MG tablet Take 1 tablet (81 mg total) by mouth daily. Swallow whole.      atorvastatin (LIPITOR) 40 MG tablet Take 1 tablet (40 mg total) by mouth daily.      cyanocobalamin 1000 MCG tablet Take 1 tablet (1,000 mcg total) by mouth daily.      escitalopram (LEXAPRO) 20 MG tablet Take 20 mg by mouth daily.      feeding supplement (ENSURE ENLIVE / ENSURE PLUS) LIQD Take 237 mLs by mouth 2 (two) times daily between meals.      hydrOXYzine (ATARAX) 25 MG tablet Take 1 tablet (25 mg total) by mouth every 6 (six) hours as needed for anxiety.      LUMIGAN  0.01 % SOLN Place 1 drop into the left eye at bedtime.      mirtazapine (REMERON) 7.5 MG tablet Take 1 tablet (7.5 mg total) by mouth at bedtime.      ondansetron (ZOFRAN-ODT) 4 MG disintegrating tablet Take 4 mg by mouth every 6 (six) hours as needed.      pantoprazole (PROTONIX) 40 MG tablet Take 1 tablet (40 mg total) by mouth daily.      senna-docusate (SENOKOT-S) 8.6-50 MG tablet Take 2 tablets by mouth at bedtime as needed for mild constipation.      SIMBRINZA 1-0.2 % SUSP Place 1 drop into the left eye 2 (two) times daily.       Patient Stressors: Financial difficulties   Health problems    Patient Strengths: Ability for insight  Capable of independent living  Manufacturing systems engineer  Motivation for treatment/growth   Treatment Modalities: Medication Management, Group therapy, Case management,  1 to 1 session with clinician, Psychoeducation, Recreational therapy.   Physician Treatment Plan for Primary Diagnosis:  MDD (major depressive disorder), recurrent episode, moderate (HCC) Long Term Goal(s):     Short Term Goals:    Medication Management: Evaluate patient's response, side effects, and tolerance of medication regimen.  Therapeutic Interventions: 1 to 1 sessions, Unit Group sessions and Medication administration.  Evaluation of Outcomes: Not Met  Physician Treatment Plan for Secondary Diagnosis: Principal Problem:   MDD (major depressive disorder), recurrent episode, moderate (HCC)  Long Term Goal(s):     Short Term Goals:       Medication Management: Evaluate patient's response, side effects, and tolerance of medication regimen.  Therapeutic Interventions: 1 to 1 sessions, Unit Group sessions and Medication administration.  Evaluation of Outcomes: Not Met   RN Treatment Plan for Primary Diagnosis: MDD (major depressive disorder), recurrent episode, moderate (HCC) Long Term Goal(s): Knowledge of disease and therapeutic regimen to maintain health will improve  Short Term Goals: Ability to remain free from injury will improve, Ability to verbalize frustration and anger appropriately will improve, Ability to demonstrate self-control, Ability to participate in decision making will improve, Ability to verbalize feelings will improve, Ability to disclose and discuss suicidal ideas, Ability to identify and develop effective coping behaviors will improve, and Compliance with prescribed medications will improve  Medication Management: RN will administer medications as ordered by provider, will assess and evaluate patient's response and provide education to patient for prescribed medication. RN will report any adverse and/or side effects to prescribing provider.  Therapeutic Interventions: 1 on 1 counseling sessions, Psychoeducation, Medication administration, Evaluate responses to treatment, Monitor vital signs and CBGs as ordered, Perform/monitor CIWA, COWS, AIMS and Fall Risk screenings as  ordered, Perform wound care treatments as ordered.  Evaluation of Outcomes: Not Met   LCSW Treatment Plan for Primary Diagnosis: MDD (major depressive disorder), recurrent episode, moderate (HCC) Long Term Goal(s): Safe transition to appropriate next level of care at discharge, Engage patient in therapeutic group addressing interpersonal concerns.  Short Term Goals: Engage patient in aftercare planning with referrals and resources, Increase social support, Increase ability to appropriately verbalize feelings, Increase emotional regulation, Facilitate acceptance of mental health diagnosis and concerns, and Increase skills for wellness and recovery  Therapeutic Interventions: Assess for all discharge needs, 1 to 1 time with Social worker, Explore available resources and support systems, Assess for adequacy in community support network, Educate family and significant other(s) on suicide prevention, Complete Psychosocial Assessment, Interpersonal group therapy.  Evaluation of Outcomes: Not Met   Progress in  Treatment: Attending groups: No. Participating in groups: No. Taking medication as prescribed: Yes. Toleration medication: Yes. Family/Significant other contact made: No, will contact:  once permission is given Patient understands diagnosis: Yes. Discussing patient identified problems/goals with staff: Yes. Medical problems stabilized or resolved: Yes. Denies suicidal/homicidal ideation: No. Issues/concerns per patient self-inventory: No. Other: none  New problem(s) identified: No, Describe:  none  New Short Term/Long Term Goal(s): detox, elimination of symptoms of psychosis, medication management for mood stabilization; elimination of SI thoughts; development of comprehensive mental wellness/sobriety plan.   Patient Goals:  "get better so I can get my life back"  Discharge Plan or Barriers: CSW to assist patient in development of appropriate discharge plans.    Reason for  Continuation of Hospitalization: Anxiety Depression Medication stabilization Suicidal ideation  Estimated Length of Stay:  1-7 days  Last 3 [place] Suicide Severity Risk Score: Flowsheet Row Admission (Current) from [facility]/04/2023 in [facility] ED to Hosp-Admission (Discharged) from 05/02/2023 in [place] [facility]  C-SSRS RISK CATEGORY Moderate Risk No Risk       Last PHQ 2/9 Scores:     No data to display          Scribe for Treatment Team: Harden Mo, [place] 05/05/2023 9:53 AM

## 2023-05-06 NOTE — Group Note (Signed)
[facility] LCSW Group Therapy Note   Group Date: 05/06/2023 Start Time: 1315 End Time: 1345   Type of Therapy/Topic:  Group Therapy:  Balance in Life  Participation Level:  Did Not Attend   Description of Group:    This group will address the concept of balance and how it feels and looks when one is unbalanced. Patients will be encouraged to process areas in their lives that are out of balance, and identify reasons for remaining unbalanced. Facilitators will guide patients utilizing problem- solving interventions to address and correct the stressor making their life unbalanced. Understanding and applying boundaries will be explored and addressed for obtaining  and maintaining a balanced life. Patients will be encouraged to explore ways to assertively make their unbalanced needs known to significant others in their lives, using other group members and facilitator for support and feedback.  Therapeutic Goals: Patient will identify two or more emotions or situations they have that consume much of in their lives. Patient will identify signs/triggers that life has become out of balance:  Patient will identify two ways to set boundaries in order to achieve balance in their lives:  Patient will demonstrate ability to communicate their needs through discussion and/or role plays  Summary of Patient Progress:    X    Therapeutic Modalities:   Cognitive Behavioral Therapy Solution-Focused Therapy Assertiveness Training   Elza Rafter, LCSWA

## 2023-05-06 NOTE — Progress Notes (Signed)
   05/06/23 1500  Spiritual Encounters  Type of Visit Initial  Care provided to: Patient  Conversation partners present during encounter Nurse  Referral source Patient request  Reason for visit Routine spiritual support  OnCall Visit No   Nurse told chaplain that the patient would like to speak and have prayer. Chaplain went to speak with patient and patient would like to have prayer tomorrow. Chaplain will follow up with patient tomorrow.

## 2023-05-06 NOTE — Group Note (Signed)
Date:  05/06/2023 Time:  11:53 AM  Group Topic/Focus:  The cycle of anxiety  Patients identify triggers and traits that cause anxiety   Participation Level:  Active  Participation Quality:  Appropriate  Affect:  Appropriate  Cognitive:  Appropriate  Insight: Appropriate  Engagement in Group:  Engaged  Modes of Intervention:  Activity and Confrontation  Additional Comments:    Marta Antu 05/06/2023, 11:53 AM

## 2023-05-06 NOTE — Progress Notes (Signed)
[facility] MD Progress Note  05/06/2023  Julia Osborne  MRN:  [id number]  Subjective: Case discussed with staff, chart reviewed, patient seen today.  Patient reports that she continues to feel depressed.  She has been attending group and working on coping strategies.  Patient reports improvement in her appetite and sleep.  Patient was provided with support and reassurance.  She denies any intention or plan to harm herself on the unit.  She denies manic or psychotic symptoms.  Principal Problem: MDD (major depressive disorder), recurrent episode, moderate (HCC) Diagnosis: Principal Problem:   MDD (major depressive disorder), recurrent episode, moderate (HCC)   Past Psychiatric History: Patient reports history of depression and PTSD  Past Medical History: History reviewed. No pertinent past medical history. History reviewed. No pertinent surgical history. Family History:   Reportedly Daughter w/ bipolar disorder Mom and sister with borderline - both with multiple suicide attempts  Social History:  Social History   Substance and Sexual Activity  Alcohol Use None      Social History   Substance and Sexual Activity  Drug Use Not on file    Social History   Socioeconomic History   Marital status: Widowed    Spouse name: Not on file   Number of children: Not on file   Years of education: Not on file   Highest education level: Not on file  Occupational History   Not on file  Tobacco Use   Smoking status: Never   Smokeless tobacco: Never  Vaping Use   Vaping status: Never Used  Substance and Sexual Activity   Alcohol use: Not on file   Drug use: Not on file   Sexual activity: Not on file  Other Topics Concern   Not on file  Social History Narrative   Lives alone with 4 large dogs.    Social Determinants of Health   Financial Resource Strain: Not on file  Food Insecurity: Food Insecurity Present (05/04/2023)   Hunger Vital Sign    Worried About Running Out of Food in the  Last Year: Sometimes true    Ran Out of Food in the Last Year: Sometimes true  Transportation Needs: No Transportation Needs (05/04/2023)   PRAPARE - Administrator, Civil Service (Medical): No    Lack of Transportation (Non-Medical): No  Physical Activity: Not on file  Stress: Not on file  Social Connections: Not on file   Additional Social History:                         Sleep: Fair  Appetite:  Fair  Current Medications: Current Facility-Administered Medications  Medication Dose Route Frequency Provider Last Rate Last Admin   acetaminophen (TYLENOL) tablet 650 mg  650 mg Oral Q6H PRN Maryagnes Amos, FNP       aspirin EC tablet 81 mg  81 mg Oral Daily Maryagnes Amos, FNP   81 mg at 05/06/23 0950   atorvastatin (LIPITOR) tablet 40 mg  40 mg Oral Daily Maryagnes Amos, FNP   40 mg at 05/06/23 0950   busPIRone (BUSPAR) tablet 5 mg  5 mg Oral BID Lewanda Rife, MD   5 mg at 05/06/23 0951   cyanocobalamin (VITAMIN B12) tablet 1,000 mcg  1,000 mcg Oral Daily Maryagnes Amos, FNP   1,000 mcg at 05/06/23 0951   escitalopram (LEXAPRO) tablet 20 mg  20 mg Oral QHS Jearld Lesch, NP   20 mg at 05/05/23 2125  feeding supplement (ENSURE ENLIVE / ENSURE PLUS) liquid 237 mL  237 mL Oral TID BM Sarina Ill, DO   237 mL at 05/05/23 2016   haloperidol (HALDOL) tablet 5 mg  5 mg Oral TID PRN Maryagnes Amos, FNP       Or   haloperidol lactate (HALDOL) injection 5 mg  5 mg Intramuscular TID PRN Rosario Adie, Juel Burrow, FNP       hydrOXYzine (ATARAX) tablet 25 mg  25 mg Oral Q6H PRN Starkes-Perry, Juel Burrow, FNP       latanoprost (XALATAN) 0.005 % ophthalmic solution 1 drop  1 drop Left Eye QHS Maryagnes Amos, FNP   1 drop at 05/05/23 2128   LORazepam (ATIVAN) tablet 2 mg  2 mg Oral TID PRN Maryagnes Amos, FNP       Or   LORazepam (ATIVAN) injection 2 mg  2 mg Intramuscular TID PRN Starkes-Perry, Juel Burrow, FNP        magnesium hydroxide (MILK OF MAGNESIA) suspension 30 mL  30 mL Oral Daily PRN Starkes-Perry, Juel Burrow, FNP       mirtazapine (REMERON) tablet 7.5 mg  7.5 mg Oral QHS Maryagnes Amos, FNP   7.5 mg at 05/05/23 2127   montelukast (SINGULAIR) tablet 10 mg  10 mg Oral Daily Maryagnes Amos, FNP   10 mg at 05/06/23 0981   multivitamin with minerals tablet 1 tablet  1 tablet Oral Daily Sarina Ill, DO   1 tablet at 05/06/23 0950   pantoprazole (PROTONIX) EC tablet 40 mg  40 mg Oral QAC breakfast Maryagnes Amos, FNP   40 mg at 05/06/23 1914   senna-docusate (Senokot-S) tablet 2 tablet  2 tablet Oral BID Maryagnes Amos, FNP   2 tablet at 05/05/23 1022   thiamine (VITAMIN B1) tablet 100 mg  100 mg Oral Daily Maryagnes Amos, FNP   100 mg at 05/06/23 0951   traZODone (DESYREL) tablet 25 mg  25 mg Oral QHS PRN Lewanda Rife, MD   25 mg at 05/05/23 2126    Lab Results: No results found for this or any previous visit (from the past 48 hour(s)).  Blood Alcohol level:  Lab Results  Component Value Date   ETH <10 05/02/2023    Metabolic Disorder Labs: Lab Results  Component Value Date   HGBA1C 5.9 (H) 05/03/2023   MPG 123 05/03/2023   No results found for: "PROLACTIN" Lab Results  Component Value Date   CHOL 149 05/03/2023   TRIG 66 05/03/2023   HDL 49 05/03/2023   CHOLHDL 3.0 05/03/2023   VLDL 13 05/03/2023   LDLCALC 87 05/03/2023    Musculoskeletal: Strength & Muscle Tone: within normal limits Gait & Station: normal Patient leans: N/A   Psychiatric Specialty Exam:   Presentation  General Appearance:  Appropriate for Environment   Eye Contact: Good   Speech: Clear and Coherent   Speech Volume: Decreased     Mood and Affect  Mood: Anxious; Depressed; Hopeless   Affect: Congruent; Constricted; Restricted     Thought Process  Thought Processes: Coherent; Goal Directed; Linear   Descriptions of  Associations:Intact   Orientation:Full (Time, Place and Person)   Thought Content:Abstract Reasoning; Logical   History of Schizophrenia/Schizoaffective disorder:No   Duration of Psychotic Symptoms:N/A   Hallucinations:Hallucinations: None   Ideas of Reference:None   Suicidal Thoughts:Suicidal Thoughts: Yes, Active SI Active Intent and/or Plan: Without Plan     Homicidal Thoughts:Homicidal Thoughts: No  Sensorium  Memory: Immediate Fair; Recent Fair; Remote Fair   Judgment: Fair   Insight: Fair     Chartered certified accountant: Fair   Attention Span: Fair   Recall: [employer] of Knowledge: Fair   Language: Fair     Psychomotor Activity  Psychomotor Activity: Psychomotor Activity: Decreased     Assets  Assets: Communication Skills; Desire for Improvement; Housing     Sleep  Sleep: Sleep: "Better"       Physical Exam: Physical Exam Constitutional:      Appearance: Normal appearance.  HENT:     Head: Normocephalic and atraumatic.     Nose: Nose normal.  Eyes:     Pupils: Pupils are equal, round, and reactive to light.  Cardiovascular:     Rate and Rhythm: Normal rate.  Pulmonary:     Effort: Pulmonary effort is normal.  Skin:    General: Skin is warm.  Neurological:     General: No focal deficit present.     Mental Status: She is alert and oriented to person, place, and time.      Review of Systems  Constitutional:  Positive for malaise/fatigue. Negative for chills and fever.  HENT:  Negative for congestion, hearing loss and sinus pain.   Eyes:  Negative for blurred vision and double vision.  Respiratory:  Negative for cough and shortness of breath.   Cardiovascular:  Negative for chest pain and palpitations.  Gastrointestinal:  Negative for nausea and vomiting.  Neurological:  Negative for dizziness, sensory change, speech change and focal weakness.  Psychiatric/Behavioral:  Positive for depression and suicidal  ideas. The patient is nervous/anxious    Blood pressure 130/77, pulse 75, temperature 97.6 F (36.4 C), resp. rate 15, height 5\' 3"  (1.6 m), weight 52.4 kg, SpO2 100%. Body mass index is 20.46 kg/m.   Treatment Plan Summary: Daily contact with patient to assess and evaluate symptoms and progress in treatment and Medication management    Patient is admitted to locked unit under safety precaution 2.  Lexapro 20 mg by mouth daily, Remeron 7.5 mg by mouth at bedtime 3. BuSpar 5 mg by mouth twice daily for anxiety, side effects discussed with the patient 4.  Social worker consult to get collateral from family and help with a safe discharge plan 5.  Patient was encouraged to attend groups and work on coping strategies    Lewanda Rife, MD

## 2023-05-06 NOTE — Group Note (Signed)
Recreation Therapy Group Note   Group Topic:Healthy Support Systems  Group Date: 05/06/2023 Start Time: 1410 End Time: 1500 Facilitators: Rosina Lowenstein, LRT, CTRS Location:  Day room  Group Description: Straw Bridge.  Patients were given 10 plastic drinking straws and an equal length of masking tape. Using the materials provided, patients were instructed to build a free-standing bridge-like structure to suspend an everyday item (ex: deck of cards) off the floor or table surface. All materials were required to be used in Secondary school teacher. LRT facilitated post-activity discussion reviewing the importance of having strong and healthy support systems in our lives. LRT discussed how the people in our lives serve as the tape and the deck of cards we placed on top of our straw structure are the stressors we face in daily life. LRT and pts discussed what happens in our life when things get too heavy for [place], and we don't have strong supports outside of the hospital. Pt shared 2 of their healthy supports aloud in the group.    Goal Area(s) Addressed:  Patient will identify 2 healthy supports in their life. Patient will identify skills to successfully complete activity. Patient will identify correlation of this activity to life post-discharge.  Patient will work on Product manager.   Affect/Mood: Appropriate   Participation Level: Active and Engaged   Participation Quality: Independent   Behavior: Appropriate, Calm, and Cooperative   Speech/Thought Process: Coherent   Insight: Good   Judgement: Good   Modes of Intervention: Activity   Patient Response to Interventions:  Attentive, Engaged, Interested , and Receptive   Education Outcome:  Acknowledges education   Clinical Observations/Individualized Feedback: Julia Osborne was active in their participation of session activities and group discussion. Pt identified "God and prayer" as her healthy supports. LRT asked if pt would like to  speak with a chaplin, pt agreed. LRT let pts nurse know and nurse said she would put a consult in. Pt interacted well with LRT and peers duration of session.   Plan: Continue to engage patient in RT group sessions 2-3x/week.   Rosina Lowenstein, LRT, CTRS 05/06/2023 3:50 PM

## 2023-05-07 NOTE — Plan of Care (Signed)

## 2023-05-07 NOTE — Progress Notes (Signed)
   05/07/23 1300  Spiritual Encounters  Type of Visit Follow up  Care provided to: Patient  Conversation partners present during encounter Nurse  Referral source Patient request  Reason for visit Routine spiritual support  OnCall Visit No  Spiritual Framework  Presenting Themes Meaning/purpose/sources of inspiration;Coping tools;Impactful experiences and emotions;Courage hope and growth  Community/Connection Family  Patient Stress Factors Family relationships;Financial concerns;Health changes;Loss  Family Stress Factors Family relationships;Financial concerns;Health changes;Loss  Interventions  Spiritual Care Interventions Made Established relationship of care and support;Compassionate presence;Reflective listening;Narrative/life review;Explored values/beliefs/practices/strengths;Self-care teaching;Encouragement  Intervention Outcomes  Outcomes Connection to spiritual care;Awareness around self/spiritual resourses;Awareness of health;Autonomy/agency;Awareness of support;Reduced anxiety;Reduced isolation;Patient family open to resources  Spiritual Care Plan  Spiritual Care Issues Still Outstanding No further spiritual care needs at this time (see row info)   Chaplain went to do a follow up visit with the patient. Patient shared with the chaplain about the loss of her husband two years ago, financial stress and other familial strains. Patient stated that she just feels alone and like she is always the one helping others.Chaplain talked to the patient about self care and also gave her encouraging words of hope and courage. Chaplain also gave the patient a prayer pillow to keep.

## 2023-05-07 NOTE — Plan of Care (Signed)
D: Pt alert and oriented. Pt denies experiencing any depression or anxiety at this time. Pt reports experiencing 5/10 left foot pain at this time. Prn medication give. Pt denies experiencing any SI/HI, or AVH at this time.   A: Scheduled medications administered to pt, per MD orders. Support and encouragement provided. Frequent verbal contact made. Routine safety checks conducted q15 minutes.   R: No adverse drug reactions noted. Pt verbally contracts for safety at this time. Pt compliant with medications and treatment plan. Pt interacts well with others on the unit. Pt remains safe at this time. Plan of care ongoing.  Problem: Education: Goal: Knowledge of General Education information will improve Description: Including pain rating scale, medication(s)/side effects and non-pharmacologic comfort measures Outcome: Progressing   Problem: Activity: Goal: Risk for activity intolerance will decrease Outcome: Progressing

## 2023-05-07 NOTE — Progress Notes (Signed)
Calm and cooperative. States she's ok. Attends group. Interacting with peers and staff. Po med compliant. No behavior issues noted. Denies SI/HI/AV/H. Q 15 min checks provided. No c/o pain/discomfort noted.

## 2023-05-07 NOTE — Progress Notes (Signed)
[facility] MD Progress Note  05/07/2023  Julia Osborne  MRN:  [id number]  Subjective: Case discussed with staff, chart reviewed, patient seen today.  Patient reports that she feels little better today.  Patient affect was more animated today.  She has been attending group and working on coping strategies.  Patient reports improvement in her appetite and sleep.  Patient was provided with support and reassurance.  She denies any intention or plan to harm herself on the unit.  She denies manic or psychotic symptoms.  Principal Problem: MDD (major depressive disorder), recurrent episode, moderate (HCC) Diagnosis: Principal Problem:   MDD (major depressive disorder), recurrent episode, moderate (HCC)   Past Psychiatric History: Patient reports history of depression and PTSD  Past Medical History: History reviewed. No pertinent past medical history. History reviewed. No pertinent surgical history. Family History:   Reportedly Daughter w/ bipolar disorder Mom and sister with borderline - both with multiple suicide attempts  Social History:  Social History   Substance and Sexual Activity  Alcohol Use None      Social History   Substance and Sexual Activity  Drug Use Not on file    Social History   Socioeconomic History   Marital status: Widowed    Spouse name: Not on file   Number of children: Not on file   Years of education: Not on file   Highest education level: Not on file  Occupational History   Not on file  Tobacco Use   Smoking status: Never   Smokeless tobacco: Never  Vaping Use   Vaping status: Never Used  Substance and Sexual Activity   Alcohol use: Not on file   Drug use: Not on file   Sexual activity: Not on file  Other Topics Concern   Not on file  Social History Narrative   Lives alone with 4 large dogs.    Social Determinants of Health   Financial Resource Strain: Not on file  Food Insecurity: Food Insecurity Present (05/04/2023)   Hunger Vital Sign     Worried About Running Out of Food in the Last Year: Sometimes true    Ran Out of Food in the Last Year: Sometimes true  Transportation Needs: No Transportation Needs (05/04/2023)   PRAPARE - Administrator, Civil Service (Medical): No    Lack of Transportation (Non-Medical): No  Physical Activity: Not on file  Stress: Not on file  Social Connections: Not on file   Additional Social History:                         Sleep: Fair  Appetite:  Fair  Current Medications: Current Facility-Administered Medications  Medication Dose Route Frequency Provider Last Rate Last Admin   acetaminophen (TYLENOL) tablet 650 mg  650 mg Oral Q6H PRN Maryagnes Amos, FNP   650 mg at 05/07/23 1005   aspirin EC tablet 81 mg  81 mg Oral Daily Maryagnes Amos, FNP   81 mg at 05/07/23 2202   atorvastatin (LIPITOR) tablet 40 mg  40 mg Oral Daily Maryagnes Amos, FNP   40 mg at 05/07/23 5427   busPIRone (BUSPAR) tablet 5 mg  5 mg Oral BID Lewanda Rife, MD   5 mg at 05/07/23 0623   cyanocobalamin (VITAMIN B12) tablet 1,000 mcg  1,000 mcg Oral Daily Maryagnes Amos, FNP   1,000 mcg at 05/07/23 0922   escitalopram (LEXAPRO) tablet 20 mg  20 mg Oral QHS Dixon,  Elray Buba, NP   20 mg at 05/06/23 2126   feeding supplement (ENSURE ENLIVE / ENSURE PLUS) liquid 237 mL  237 mL Oral TID BM Sarina Ill, DO   237 mL at 05/07/23 1337   haloperidol (HALDOL) tablet 5 mg  5 mg Oral TID PRN Maryagnes Amos, FNP       Or   haloperidol lactate (HALDOL) injection 5 mg  5 mg Intramuscular TID PRN Rosario Adie, Juel Burrow, FNP       hydrOXYzine (ATARAX) tablet 25 mg  25 mg Oral Q6H PRN Starkes-Perry, Juel Burrow, FNP       latanoprost (XALATAN) 0.005 % ophthalmic solution 1 drop  1 drop Left Eye QHS Maryagnes Amos, FNP   1 drop at 05/06/23 2124   LORazepam (ATIVAN) tablet 2 mg  2 mg Oral TID PRN Maryagnes Amos, FNP       Or   LORazepam (ATIVAN) injection 2  mg  2 mg Intramuscular TID PRN Starkes-Perry, Juel Burrow, FNP       magnesium hydroxide (MILK OF MAGNESIA) suspension 30 mL  30 mL Oral Daily PRN Starkes-Perry, Juel Burrow, FNP       mirtazapine (REMERON) tablet 7.5 mg  7.5 mg Oral QHS Maryagnes Amos, FNP   7.5 mg at 05/06/23 2127   montelukast (SINGULAIR) tablet 10 mg  10 mg Oral Daily Maryagnes Amos, FNP   10 mg at 05/07/23 6213   multivitamin with minerals tablet 1 tablet  1 tablet Oral Daily Sarina Ill, DO   1 tablet at 05/07/23 0865   pantoprazole (PROTONIX) EC tablet 40 mg  40 mg Oral QAC breakfast Maryagnes Amos, FNP   40 mg at 05/07/23 7846   senna-docusate (Senokot-S) tablet 2 tablet  2 tablet Oral BID Maryagnes Amos, FNP   2 tablet at 05/05/23 1022   thiamine (VITAMIN B1) tablet 100 mg  100 mg Oral Daily Maryagnes Amos, FNP   100 mg at 05/07/23 9629   traZODone (DESYREL) tablet 25 mg  25 mg Oral QHS PRN Lewanda Rife, MD   25 mg at 05/05/23 2126    Lab Results: No results found for this or any previous visit (from the past 48 hour(s)).  Blood Alcohol level:  Lab Results  Component Value Date   ETH <10 05/02/2023    Metabolic Disorder Labs: Lab Results  Component Value Date   HGBA1C 5.9 (H) 05/03/2023   MPG 123 05/03/2023   No results found for: "PROLACTIN" Lab Results  Component Value Date   CHOL 149 05/03/2023   TRIG 66 05/03/2023   HDL 49 05/03/2023   CHOLHDL 3.0 05/03/2023   VLDL 13 05/03/2023   LDLCALC 87 05/03/2023    Musculoskeletal: Strength & Muscle Tone: within normal limits Gait & Station: normal Patient leans: N/A   Psychiatric Specialty Exam:   Presentation  General Appearance:  Appropriate for Environment   Eye Contact: Good   Speech: Clear and Coherent   Speech Volume: Decreased     Mood and Affect  Mood: Little better   Affect: Congruent; Less Constricted     Thought Process  Thought Processes: Coherent; Goal Directed;  Linear   Descriptions of Associations:Intact   Orientation:Full (Time, Place and Person)   Thought Content:Abstract Reasoning; Logical   History of Schizophrenia/Schizoaffective disorder:No   Duration of Psychotic Symptoms:N/A   Hallucinations:Hallucinations: None   Ideas of Reference:None   Suicidal Thoughts:Suicidal Thoughts: Denies   Homicidal Thoughts:Homicidal Thoughts: No  Sensorium  Memory: Immediate Fair; Recent Fair; Remote Fair   Judgment: Fair   Insight: Fair     Chartered certified accountant: Fair   Attention Span: Fair   Recall: [employer] of Knowledge: Fair   Language: Fair     Psychomotor Activity  Psychomotor Activity: Psychomotor Activity: Decreased     Assets  Assets: Communication Skills; Desire for Improvement; Housing     Sleep  Sleep: Sleep: "Better"       Physical Exam: Physical Exam Constitutional:      Appearance: Normal appearance.  HENT:     Head: Normocephalic and atraumatic.     Nose: Nose normal.  Eyes:     Pupils: Pupils are equal, round, and reactive to light.  Cardiovascular:     Rate and Rhythm: Normal rate.  Pulmonary:     Effort: Pulmonary effort is normal.  Skin:    General: Skin is warm.  Neurological:     General: No focal deficit present.     Mental Status: She is alert and oriented to person, place, and time.      Review of Systems  Constitutional:  Positive for malaise/fatigue. Negative for chills and fever.  HENT:  Negative for congestion, hearing loss and sinus pain.   Eyes:  Negative for blurred vision and double vision.  Respiratory:  Negative for cough and shortness of breath.   Cardiovascular:  Negative for chest pain and palpitations.  Gastrointestinal:  Negative for nausea and vomiting.  Neurological:  Negative for dizziness, sensory change, speech change and focal weakness.  Psychiatric/Behavioral:  Positive for depression and suicidal ideas. The patient is  nervous/anxious    Blood pressure 104/71, pulse 67, temperature (!) 97.1 F (36.2 C), resp. rate 18, height 5\' 3"  (1.6 m), weight 52.4 kg, SpO2 100%. Body mass index is 20.46 kg/m.   Treatment Plan Summary: Daily contact with patient to assess and evaluate symptoms and progress in treatment and Medication management    Patient is admitted to locked unit under safety precaution 2.  Lexapro 20 mg by mouth daily, Remeron 7.5 mg by mouth at bedtime 3. BuSpar 5 mg by mouth twice daily for anxiety, side effects discussed with the patient 4.  Social worker consult to get collateral from family and help with a safe discharge plan 5.  Patient was encouraged to attend groups and work on coping strategies    Lewanda Rife, MD

## 2023-05-07 NOTE — Group Note (Signed)
Date:  05/07/2023 Time:  9:51 PM  Group Topic/Focus:  Personal Choices and Values:   The focus of this group is to help patients assess and explore the importance of values in their lives, how their values affect their decisions, how they express their values and what opposes their expression.    Participation Level:  Active  Participation Quality:  Appropriate  Affect:  Appropriate  Cognitive:  Appropriate  Insight: Good  Engagement in Group:  Engaged  Modes of Intervention:  Education  Additional Comments:    Garry Heater 05/07/2023, 9:51 PM

## 2023-05-07 NOTE — Group Note (Signed)
Date:  05/07/2023 Time:  3:48 AM  Group Topic/Focus:  Healthy Communication:   The focus of this group is to discuss communication, barriers to communication, as well as healthy ways to communicate with others.    Participation Level:  Active  Participation Quality:  Appropriate  Affect:  Appropriate  Cognitive:  Appropriate  Insight: Appropriate  Engagement in Group:  Engaged  Modes of Intervention:  Education  Additional Comments:    Garry Heater 05/07/2023, 3:48 AM

## 2023-05-07 NOTE — Group Note (Signed)
Recreation Therapy Group Note   Group Topic:Leisure Education  Group Date: 05/07/2023 Start Time: 1340 End Time: 1415 Facilitators: Rosina Lowenstein, LRT, CTRS Location: Courtyard   Group Description: Leisure. Patients were given the opportunity to play ring toss, play cards, or listen to music while sitting in the courtyard getting fresh air and sunlight. LRT and pts discussed the meaning of leisure, the importance of participating in leisure during their free time/when they're outside of the hospital, as well as how our leisure interests can also serve as coping skills. Pt identified two leisure interests and shared with the group.   Goal Area(s) Addressed:  Patient will identify a current leisure interest.  Patient will learn the definition of "leisure". Patient will practice making a positive decision. Patient will have the opportunity to try a new leisure activity. Patient will communicate with peers and LRT.    Affect/Mood: Appropriate   Participation Level: Active and Engaged   Participation Quality: Independent   Behavior: Appropriate, Calm, and Cooperative   Speech/Thought Process: Coherent   Insight: Good   Judgement: Good   Modes of Intervention: Activity   Patient Response to Interventions:  Attentive, Engaged, Interested , and Receptive   Education Outcome:  Acknowledges education   Clinical Observations/Individualized Feedback: Renu was active in their participation of session activities and group discussion. Pt identified "watch videos on youtube, read and sew" as things she does in her free time. Pt interacted well with LRT and peers duration of session.   Plan: Continue to engage patient in RT group sessions 2-3x/week.   Rosina Lowenstein, LRT, CTRS 05/07/2023 2:49 PM

## 2023-05-07 NOTE — Group Note (Signed)
Date:  05/07/2023 Time:  10:51 AM  Group Topic/Focus:  Goals Group:   The focus of this group is to help patients establish daily goals to achieve during treatment and discuss how the patient can incorporate goal setting into their daily lives to aide in recovery.    Participation Level:  Did Not Attend    Rodena Goldmann 05/07/2023, 10:51 AM

## 2023-05-08 MED ORDER — GABAPENTIN 100 MG PO CAPS
200.0000 mg | ORAL_CAPSULE | Freq: Every day | ORAL | Status: DC
  Administered 2023-05-08 – 2023-05-09 (×2): 200 mg via ORAL
  Filled 2023-05-08 (×2): qty 2

## 2023-05-08 NOTE — Plan of Care (Signed)
Pleasant and cooperative. Visible on the unit. Interacting with peers and staff. Po med compliant. No behavior issues noted. Q 15 min checks maintained for safety. Denies SI/HI/AV/H.   Problem: Nutrition: Goal: Adequate nutrition will be maintained Outcome: Progressing   Problem: Coping: Goal: Level of anxiety will decrease Outcome: Progressing   Problem: Elimination: Goal: Will not experience complications related to bowel motility Outcome: Progressing Goal: Will not experience complications related to urinary retention Outcome: Progressing   Problem: Pain Managment: Goal: General experience of comfort will improve Outcome: Progressing   Problem: Safety: Goal: Ability to remain free from injury will improve Outcome: Progressing

## 2023-05-08 NOTE — Plan of Care (Signed)
  Problem: Education: Goal: Knowledge of General Education information will improve Description: Including pain rating scale, medication(s)/side effects and non-pharmacologic comfort measures Outcome: Progressing   Problem: Health Behavior/Discharge Planning: Goal: Compliance with therapeutic regimen will improve Outcome: Progressing   Problem: Self-Concept: Goal: Level of anxiety will decrease Outcome: Progressing

## 2023-05-08 NOTE — Progress Notes (Signed)
Patient pleasant and cooperative.  Denies SI/HI and AVH.  Endorses anxiety and depression, but states both have improved.  Patient washed her clothes today.  Attended MHT group in courtyard.  Compliant with scheduled medications.  15 min checks in place for safety.  Patient present  in milieu.  Appropriate interaction with peers and staff.  Patient observed laughing and joking with peers.    Plan is to discharge home Monday.

## 2023-05-08 NOTE — Progress Notes (Signed)
[facility] MD Progress Note  05/08/2023  Julia Osborne  MRN:  [id number]  Subjective: Case discussed with staff, chart reviewed, patient seen today.  Patient reports that she feels better today. Patient is requesting to restart Gabapentin today. She is not sure about the dose. Patient affect was more animated today.  She has been attending group and working on coping strategies.  Patient reports improvement in her appetite and sleep.  Patient was provided with support and reassurance.  She denies any intention or plan to harm herself on the unit.  She denies manic or psychotic symptoms.  Principal Problem: MDD (major depressive disorder), recurrent episode, moderate (HCC) Diagnosis: Principal Problem:   MDD (major depressive disorder), recurrent episode, moderate (HCC)   Past Psychiatric History: Patient reports history of depression and PTSD  Past Medical History: History reviewed. No pertinent past medical history. History reviewed. No pertinent surgical history. Family History:   Reportedly Daughter w/ bipolar disorder Mom and sister with borderline - both with multiple suicide attempts  Social History:  Social History   Substance and Sexual Activity  Alcohol Use None      Social History   Substance and Sexual Activity  Drug Use Not on file    Social History   Socioeconomic History   Marital status: Widowed    Spouse name: Not on file   Number of children: Not on file   Years of education: Not on file   Highest education level: Not on file  Occupational History   Not on file  Tobacco Use   Smoking status: Never   Smokeless tobacco: Never  Vaping Use   Vaping status: Never Used  Substance and Sexual Activity   Alcohol use: Not on file   Drug use: Not on file   Sexual activity: Not on file  Other Topics Concern   Not on file  Social History Narrative   Lives alone with 4 large dogs.    Social Determinants of Health   Financial Resource Strain: Not on file  Food  Insecurity: Food Insecurity Present (05/04/2023)   Hunger Vital Sign    Worried About Running Out of Food in the Last Year: Sometimes true    Ran Out of Food in the Last Year: Sometimes true  Transportation Needs: No Transportation Needs (05/04/2023)   PRAPARE - Administrator, Civil Service (Medical): No    Lack of Transportation (Non-Medical): No  Physical Activity: Not on file  Stress: Not on file  Social Connections: Not on file   Additional Social History:                         Sleep: Fair  Appetite:  Fair  Current Medications: Current Facility-Administered Medications  Medication Dose Route Frequency Provider Last Rate Last Admin   acetaminophen (TYLENOL) tablet 650 mg  650 mg Oral Q6H PRN Maryagnes Amos, FNP   650 mg at 05/07/23 1005   aspirin EC tablet 81 mg  81 mg Oral Daily Maryagnes Amos, FNP   81 mg at 05/08/23 1031   atorvastatin (LIPITOR) tablet 40 mg  40 mg Oral Daily Maryagnes Amos, FNP   40 mg at 05/08/23 1030   busPIRone (BUSPAR) tablet 5 mg  5 mg Oral BID Lewanda Rife, MD   5 mg at 05/08/23 1031   cyanocobalamin (VITAMIN B12) tablet 1,000 mcg  1,000 mcg Oral Daily Maryagnes Amos, FNP   1,000 mcg at 05/08/23 1031  escitalopram (LEXAPRO) tablet 20 mg  20 mg Oral QHS Dixon, Rashaun M, NP   20 mg at 05/07/23 2143   feeding supplement (ENSURE ENLIVE / ENSURE PLUS) liquid 237 mL  237 mL Oral TID BM Sarina Ill, DO   237 mL at 05/07/23 2000   haloperidol (HALDOL) tablet 5 mg  5 mg Oral TID PRN Maryagnes Amos, FNP       Or   haloperidol lactate (HALDOL) injection 5 mg  5 mg Intramuscular TID PRN Rosario Adie, Juel Burrow, FNP       hydrOXYzine (ATARAX) tablet 25 mg  25 mg Oral Q6H PRN Starkes-Perry, Juel Burrow, FNP       latanoprost (XALATAN) 0.005 % ophthalmic solution 1 drop  1 drop Left Eye QHS Maryagnes Amos, FNP   1 drop at 05/07/23 2143   LORazepam (ATIVAN) tablet 2 mg  2 mg Oral TID  PRN Maryagnes Amos, FNP       Or   LORazepam (ATIVAN) injection 2 mg  2 mg Intramuscular TID PRN Starkes-Perry, Juel Burrow, FNP       magnesium hydroxide (MILK OF MAGNESIA) suspension 30 mL  30 mL Oral Daily PRN Starkes-Perry, Juel Burrow, FNP       mirtazapine (REMERON) tablet 7.5 mg  7.5 mg Oral QHS Maryagnes Amos, FNP   7.5 mg at 05/07/23 2145   montelukast (SINGULAIR) tablet 10 mg  10 mg Oral Daily Maryagnes Amos, FNP   10 mg at 05/08/23 1031   multivitamin with minerals tablet 1 tablet  1 tablet Oral Daily Sarina Ill, DO   1 tablet at 05/08/23 1031   pantoprazole (PROTONIX) EC tablet 40 mg  40 mg Oral QAC breakfast Maryagnes Amos, FNP   40 mg at 05/08/23 1031   senna-docusate (Senokot-S) tablet 2 tablet  2 tablet Oral BID Maryagnes Amos, FNP   2 tablet at 05/05/23 1022   thiamine (VITAMIN B1) tablet 100 mg  100 mg Oral Daily Maryagnes Amos, FNP   100 mg at 05/08/23 1031   traZODone (DESYREL) tablet 25 mg  25 mg Oral QHS PRN Lewanda Rife, MD   25 mg at 05/05/23 2126    Lab Results: No results found for this or any previous visit (from the past 48 hour(s)).  Blood Alcohol level:  Lab Results  Component Value Date   ETH <10 05/02/2023    Metabolic Disorder Labs: Lab Results  Component Value Date   HGBA1C 5.9 (H) 05/03/2023   MPG 123 05/03/2023   No results found for: "PROLACTIN" Lab Results  Component Value Date   CHOL 149 05/03/2023   TRIG 66 05/03/2023   HDL 49 05/03/2023   CHOLHDL 3.0 05/03/2023   VLDL 13 05/03/2023   LDLCALC 87 05/03/2023    Musculoskeletal: Strength & Muscle Tone: within normal limits Gait & Station: normal Patient leans: N/A   Psychiatric Specialty Exam:   Presentation  General Appearance:  Appropriate for Environment   Eye Contact: Good   Speech: Clear and Coherent   Speech Volume: Decreased     Mood and Affect  Mood: Little better   Affect: Congruent; Less  Constricted     Thought Process  Thought Processes: Coherent; Goal Directed; Linear   Descriptions of Associations:Intact   Orientation:Full (Time, Place and Person)   Thought Content:Abstract Reasoning; Logical   History of Schizophrenia/Schizoaffective disorder:No   Duration of Psychotic Symptoms:N/A   Hallucinations:Hallucinations: None   Ideas of Reference:None  Suicidal Thoughts:Suicidal Thoughts: Denies   Homicidal Thoughts:Homicidal Thoughts: No     Sensorium  Memory: Immediate Fair; Recent Fair; Remote Fair   Judgment: Fair   Insight: Fair     Chartered certified accountant: Fair   Attention Span: Fair   Recall: [employer] of Knowledge: Fair   Language: Fair     Psychomotor Activity  Psychomotor Activity: Psychomotor Activity: Decreased     Assets  Assets: Communication Skills; Desire for Improvement; Housing     Sleep  Sleep: Sleep: "Better"       Physical Exam: Physical Exam Constitutional:      Appearance: Normal appearance.  HENT:     Head: Normocephalic and atraumatic.     Nose: Nose normal.  Eyes:     Pupils: Pupils are equal, round, and reactive to light.  Cardiovascular:     Rate and Rhythm: Normal rate.  Pulmonary:     Effort: Pulmonary effort is normal.  Skin:    General: Skin is warm.  Neurological:     General: No focal deficit present.     Mental Status: She is alert and oriented to person, place, and time.      Review of Systems  HENT:  Negative for congestion, hearing loss and sinus pain.   Eyes:  Negative for blurred vision and double vision.  Respiratory:  Negative for cough and shortness of breath.   Cardiovascular:  Negative for chest pain and palpitations.  Gastrointestinal:  Negative for nausea and vomiting.  Neurological:  Negative for dizziness, sensory change, speech change and focal weakness.    Blood pressure 104/71, pulse 71, temperature (!) 97.5 F (36.4 C), resp. rate 18,  height 5\' 3"  (1.6 m), weight 52.4 kg, SpO2 99%. Body mass index is 20.46 kg/m.   Treatment Plan Summary: Daily contact with patient to assess and evaluate symptoms and progress in treatment and Medication management    Patient is admitted to locked unit under safety precaution 2.  Lexapro 20 mg by mouth daily, Remeron 7.5 mg by mouth at bedtime 3. BuSpar 5 mg by mouth twice daily for anxiety, side effects discussed with the patient 4.  Social worker consult to get collateral from family and help with a safe discharge plan 5.  Patient was encouraged to attend groups and work on coping strategies 6. Gabapentin 200 mg po at bedtime    Lewanda Rife, MD

## 2023-05-08 NOTE — Group Note (Signed)
Date:  05/08/2023 Time:  10:40 AM  Group Topic/Focus:  OUTDOOR RECREATION  AND RAPPORT BUILDING    Participation Level:  Active  Participation Quality:  Appropriate and Attentive  Affect:  Appropriate  Cognitive:  Alert, Appropriate, and Oriented  Insight: Appropriate  Engagement in Group:  Engaged and Supportive  Modes of Intervention:  Activity, Discussion, and Socialization  Additional Comments:    Yanci Bachtell 05/08/2023, 10:40 AM

## 2023-05-09 NOTE — Plan of Care (Signed)
  Problem: Education: Goal: Knowledge of General Education information will improve Description: Including pain rating scale, medication(s)/side effects and non-pharmacologic comfort measures Outcome: Progressing   Problem: Health Behavior/Discharge Planning: Goal: Ability to make decisions will improve Outcome: Progressing Goal: Compliance with therapeutic regimen will improve Outcome: Progressing   Problem: Self-Concept: Goal: Level of anxiety will decrease Outcome: Progressing

## 2023-05-09 NOTE — Group Note (Signed)
LCSW Group Therapy Note   Group Date: 05/09/2023 Start Time: 1350 End Time: 1450   Type of Therapy and Topic:  Group Therapy: 8 Dimensions of Wellness  Participation Level:  Active     Summary of Patient Progress:  The patient attended group. Patient proved open to input from peers and feedback from [facility]. Patient demonstrated insight into the subject matter, was respectful of peers, and participated throughout the entire session. The patient stated that she needs to do better with self care. The patient stated that she quit socializing and cut people off.     Marshell Levan, LCSWA 05/09/2023  3:25 PM

## 2023-05-09 NOTE — Progress Notes (Signed)
[facility] MD Progress Note  05/09/2023  Julia Osborne  MRN:  [id number]  Subjective: Case discussed with staff, chart reviewed, patient seen today.  Patient reports that she feels stressed out today about her son's condition.  Patient did not this morning her son had an "heart attack" and was admitted to the hospital.  Patient said that she was able to talk to her son, he just came out of surgery, he got stent placement.  Patient was provided with support and reassurance.  Patient said she is thankful that he got timely help.  Patient reports improvement in her appetite and sleep.  She denies any intention or plan to harm herself on the unit. She denies manic or psychotic symptoms.  Anticipated discharge tomorrow, patient reports she would need a ride back home.  Will consult social worker in the morning to arrange for transportation.  Principal Problem: MDD (major depressive disorder), recurrent episode, moderate (HCC) Diagnosis: Principal Problem:   MDD (major depressive disorder), recurrent episode, moderate (HCC)   Past Psychiatric History: Patient reports history of depression and PTSD  Past Medical History: History reviewed. No pertinent past medical history. History reviewed. No pertinent surgical history. Family History:   Reportedly Daughter w/ bipolar disorder Mom and sister with borderline - both with multiple suicide attempts  Social History:  Social History   Substance and Sexual Activity  Alcohol Use None      Social History   Substance and Sexual Activity  Drug Use Not on file    Social History   Socioeconomic History   Marital status: Widowed    Spouse name: Not on file   Number of children: Not on file   Years of education: Not on file   Highest education level: Not on file  Occupational History   Not on file  Tobacco Use   Smoking status: Never   Smokeless tobacco: Never  Vaping Use   Vaping status: Never Used  Substance and Sexual Activity   Alcohol use:  Not on file   Drug use: Not on file   Sexual activity: Not on file  Other Topics Concern   Not on file  Social History Narrative   Lives alone with 4 large dogs.    Social Determinants of Health   Financial Resource Strain: Not on file  Food Insecurity: Food Insecurity Present (05/04/2023)   Hunger Vital Sign    Worried About Running Out of Food in the Last Year: Sometimes true    Ran Out of Food in the Last Year: Sometimes true  Transportation Needs: No Transportation Needs (05/04/2023)   PRAPARE - Administrator, Civil Service (Medical): No    Lack of Transportation (Non-Medical): No  Physical Activity: Not on file  Stress: Not on file  Social Connections: Not on file   Additional Social History:                         Sleep: Fair  Appetite:  Fair  Current Medications: Current Facility-Administered Medications  Medication Dose Route Frequency Provider Last Rate Last Admin   acetaminophen (TYLENOL) tablet 650 mg  650 mg Oral Q6H PRN Maryagnes Amos, FNP   650 mg at 05/07/23 1005   aspirin EC tablet 81 mg  81 mg Oral Daily Maryagnes Amos, FNP   81 mg at 05/09/23 0843   atorvastatin (LIPITOR) tablet 40 mg  40 mg Oral Daily Maryagnes Amos, FNP   40 mg at  05/09/23 0843   busPIRone (BUSPAR) tablet 5 mg  5 mg Oral BID Lewanda Rife, MD   5 mg at 05/09/23 0844   cyanocobalamin (VITAMIN B12) tablet 1,000 mcg  1,000 mcg Oral Daily Maryagnes Amos, FNP   1,000 mcg at 05/09/23 0843   escitalopram (LEXAPRO) tablet 20 mg  20 mg Oral QHS Dixon, Rashaun M, NP   20 mg at 05/08/23 2106   feeding supplement (ENSURE ENLIVE / ENSURE PLUS) liquid 237 mL  237 mL Oral TID BM Sarina Ill, DO   237 mL at 05/09/23 1019   gabapentin (NEURONTIN) capsule 200 mg  200 mg Oral QHS Lewanda Rife, MD   200 mg at 05/08/23 2103   haloperidol (HALDOL) tablet 5 mg  5 mg Oral TID PRN Maryagnes Amos, FNP       Or   haloperidol lactate  (HALDOL) injection 5 mg  5 mg Intramuscular TID PRN Rosario Adie, Juel Burrow, FNP       hydrOXYzine (ATARAX) tablet 25 mg  25 mg Oral Q6H PRN Starkes-Perry, Juel Burrow, FNP       latanoprost (XALATAN) 0.005 % ophthalmic solution 1 drop  1 drop Left Eye QHS Maryagnes Amos, FNP   1 drop at 05/08/23 2106   LORazepam (ATIVAN) tablet 2 mg  2 mg Oral TID PRN Maryagnes Amos, FNP       Or   LORazepam (ATIVAN) injection 2 mg  2 mg Intramuscular TID PRN Starkes-Perry, Juel Burrow, FNP       magnesium hydroxide (MILK OF MAGNESIA) suspension 30 mL  30 mL Oral Daily PRN Starkes-Perry, Juel Burrow, FNP       mirtazapine (REMERON) tablet 7.5 mg  7.5 mg Oral QHS Maryagnes Amos, FNP   7.5 mg at 05/08/23 2106   montelukast (SINGULAIR) tablet 10 mg  10 mg Oral Daily Maryagnes Amos, FNP   10 mg at 05/09/23 0844   multivitamin with minerals tablet 1 tablet  1 tablet Oral Daily Sarina Ill, DO   1 tablet at 05/09/23 0843   pantoprazole (PROTONIX) EC tablet 40 mg  40 mg Oral QAC breakfast Maryagnes Amos, FNP   40 mg at 05/09/23 0740   senna-docusate (Senokot-S) tablet 2 tablet  2 tablet Oral BID Maryagnes Amos, FNP   2 tablet at 05/08/23 2105   thiamine (VITAMIN B1) tablet 100 mg  100 mg Oral Daily Maryagnes Amos, FNP   100 mg at 05/09/23 0844   traZODone (DESYREL) tablet 25 mg  25 mg Oral QHS PRN Lewanda Rife, MD   25 mg at 05/08/23 2105    Lab Results: No results found for this or any previous visit (from the past 48 hour(s)).  Blood Alcohol level:  Lab Results  Component Value Date   ETH <10 05/02/2023    Metabolic Disorder Labs: Lab Results  Component Value Date   HGBA1C 5.9 (H) 05/03/2023   MPG 123 05/03/2023   No results found for: "PROLACTIN" Lab Results  Component Value Date   CHOL 149 05/03/2023   TRIG 66 05/03/2023   HDL 49 05/03/2023   CHOLHDL 3.0 05/03/2023   VLDL 13 05/03/2023   LDLCALC 87 05/03/2023     Musculoskeletal: Strength & Muscle Tone: within normal limits Gait & Station: normal Patient leans: N/A   Psychiatric Specialty Exam:   Presentation  General Appearance:  Appropriate for Environment   Eye Contact: Good   Speech: Clear and Coherent   Speech  Volume: Decreased     Mood and Affect  Mood: Stressed   Affect: Congruent; Less Constricted     Thought Process  Thought Processes: Coherent; Goal Directed; Linear   Descriptions of Associations:Intact   Orientation:Full (Time, Place and Person)   Thought Content:Abstract Reasoning; Logical   History of Schizophrenia/Schizoaffective disorder:No   Duration of Psychotic Symptoms:N/A   Hallucinations:Hallucinations: None   Ideas of Reference:None   Suicidal Thoughts: Denies   Homicidal Thoughts: No     Sensorium  Memory: Immediate Fair; Recent Fair; Remote Fair   Judgment: Fair   Insight: Fair     Art therapist  Concentration: Fair   Attention Span: Fair   Recall: [employer] of Knowledge: Fair   Language: Fair     Psychomotor Activity  Psychomotor Activity: Psychomotor Activity: Decreased     Assets  Assets: Communication Skills; Desire for Improvement; Housing     Sleep  Sleep: Sleep: "Better"       Physical Exam: Physical Exam Constitutional:      Appearance: Normal appearance.  HENT:     Head: Normocephalic and atraumatic.     Nose: Nose normal.  Eyes:     Pupils: Pupils are equal, round, and reactive to light.  Cardiovascular:     Rate and Rhythm: Normal rate.  Pulmonary:     Effort: Pulmonary effort is normal.  Skin:    General: Skin is warm.  Neurological:     General: No focal deficit present.     Mental Status: She is alert and oriented to person, place, and time.      Review of Systems  HENT:  Negative for congestion, hearing loss and sinus pain.   Eyes:  Negative for blurred vision and double vision.  Respiratory:  Negative for  cough and shortness of breath.   Cardiovascular:  Negative for chest pain and palpitations.  Gastrointestinal:  Negative for nausea and vomiting.  Neurological:  Negative for dizziness, sensory change, speech change and focal weakness.    Blood pressure 128/68, pulse 64, temperature 98.2 F (36.8 C), resp. rate 18, height 5\' 3"  (1.6 m), weight 52.4 kg, SpO2 100%. Body mass index is 20.46 kg/m.   Treatment Plan Summary: Daily contact with patient to assess and evaluate symptoms and progress in treatment and Medication management    Patient is admitted to locked unit under safety precaution 2.  Lexapro 20 mg by mouth daily, Remeron 7.5 mg by mouth at bedtime 3. BuSpar 5 mg by mouth twice daily for anxiety, side effects discussed with the patient 4.  Social worker consult to get collateral from family and help with a safe discharge plan 5.  Patient was encouraged to attend groups and work on coping strategies 6. Gabapentin 200 mg po at bedtime  Anticipated discharge tomorrow    Lewanda Rife, MD

## 2023-05-09 NOTE — Plan of Care (Signed)
  Problem: Education: Goal: Knowledge of General Education information will improve Description Including pain rating scale, medication(s)/side effects and non-pharmacologic comfort measures Outcome: Progressing   Problem: Health Behavior/Discharge Planning: Goal: Ability to manage health-related needs will improve Outcome: Progressing   

## 2023-05-09 NOTE — Progress Notes (Signed)
Patient pleasant and cooperative.  Endorses anxiety regarding discharge tomorrow.  "I'll have to get back into the swing of things."   Denies SI/HI/ AVH and feelings of depression.  Denies pain.    Compliant with scheduled medications.  15 min checks in place for safety.  Patient is present in the milieu.  Interacting appropriately with peers and staff. Frequent verbal contact. Participated in social work group.

## 2023-05-10 MED ORDER — GABAPENTIN 100 MG PO CAPS: 200.0000 mg | ORAL_CAPSULE | Freq: Every day | ORAL | 0 refills | Status: DC

## 2023-05-10 MED ORDER — MONTELUKAST SODIUM 10 MG PO TABS: 10.0000 mg | ORAL_TABLET | Freq: Every day | ORAL | 0 refills | Status: DC

## 2023-05-10 MED ORDER — BUSPIRONE HCL 5 MG PO TABS: 5.0000 mg | ORAL_TABLET | Freq: Two times a day (BID) | ORAL | 0 refills | Status: DC

## 2023-05-10 MED ORDER — TRAZODONE HCL 50 MG PO TABS: 25.0000 mg | ORAL_TABLET | Freq: Every evening | ORAL | 0 refills | Status: DC | PRN

## 2023-05-10 MED ORDER — HYDROXYZINE HCL 25 MG PO TABS: 25.0000 mg | ORAL_TABLET | Freq: Four times a day (QID) | ORAL | 0 refills | Status: DC | PRN

## 2023-05-10 MED ORDER — PANTOPRAZOLE SODIUM 40 MG PO TBEC: 40.0000 mg | DELAYED_RELEASE_TABLET | Freq: Every day | ORAL | 0 refills | Status: DC

## 2023-05-10 MED ORDER — ESCITALOPRAM OXALATE 20 MG PO TABS: 20.0000 mg | ORAL_TABLET | Freq: Every day | ORAL | 0 refills | Status: DC

## 2023-05-10 MED ORDER — MIRTAZAPINE 7.5 MG PO TABS: 7.5000 mg | ORAL_TABLET | Freq: Every day | ORAL | 0 refills | Status: DC

## 2023-05-10 MED ORDER — ATORVASTATIN CALCIUM 40 MG PO TABS: 40.0000 mg | ORAL_TABLET | Freq: Every day | ORAL | 0 refills | Status: DC

## 2023-05-10 MED ORDER — ASPIRIN 81 MG PO TBEC: 81.0000 mg | DELAYED_RELEASE_TABLET | Freq: Every day | ORAL | 12 refills | Status: DC

## 2023-05-10 MED ORDER — CYANOCOBALAMIN 1000 MCG PO TABS: 1000.0000 ug | ORAL_TABLET | Freq: Every day | ORAL | 0 refills | Status: AC

## 2023-05-10 MED ORDER — LATANOPROST 0.005 % OP SOLN: 1.0000 [drp] | Freq: Every day | OPHTHALMIC | 12 refills | Status: DC

## 2023-05-10 MED ORDER — SENNOSIDES-DOCUSATE SODIUM 8.6-50 MG PO TABS: 2.0000 | ORAL_TABLET | Freq: Two times a day (BID) | ORAL | 0 refills | Status: AC

## 2023-05-10 NOTE — Care Management Important Message (Signed)
Important Message  Patient Details  Name: Julia Osborne MRN: [id number] Date of Birth: 1960-12-29   Medicare Important Message Given:  Yes     Elza Rafter, LCSWA 05/10/2023, 10:02 AM

## 2023-05-10 NOTE — Progress Notes (Signed)
  [facility] Adult Case Management Discharge Plan :  Will you be returning to the same living situation after discharge:  Yes,  pt returninghome  At discharge, do you have transportation home?: Yes,  CSW providing taxi Do you have the ability to pay for your medications: Yes,  HUMANA MEDICARE / HUMANA MEDICARE HMO  Release of information consent forms completed and in the chart;  Patient's signature needed at discharge.  Patient to Follow up at:  Follow-up Information     [facility] Follow up.   Why: Your appointment is scheduled for @10 :00 AM. Please remember to bring your insurance card. Contact information: [address] [place] [address] [phone]                 Next level of care provider has access to [facility] Link:no  Safety Planning and Suicide Prevention discussed: Yes,  attempted to contact son, unable to reach, SPE brochure discussed and given to pt     Has patient been referred to the Quitline?: Patient refused referral for treatment  Patient has been referred for addiction treatment: No known substance use disorder.  [address] LCSWA 05/10/2023, 11:39 AM
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
## 2023-05-10 NOTE — Progress Notes (Signed)
D: Pt alert and oriented. Pt denies experiencing any pain, SI/HI, or AVH at this time. Pt reports she will be able to keep herself safe when she returns home. Pt has completed a suicide safety plan and was given a survey to fill out. Pt Transition record along with other discharge papers have been reviewed and given to pt. Pt does not desire to receive a call from the Museum/gallery curator.   A: Pt  received discharge and medication education/information. Pt belongings were returned and signed for at this time to include printed prescriptions.   R: Pt verbalized understanding of discharge and medication education/information.  Pt escorted to front lobby where pt was picked up by safe transport and transported home.

## 2023-05-10 NOTE — BH IP Treatment Plan (Signed)
Interdisciplinary Treatment and Diagnostic Plan Update  05/10/2023 Time of Session: 9:00 AM  Julia Osborne MRN: [id number]  Principal Diagnosis: MDD (major depressive disorder), recurrent episode, moderate (HCC)  Secondary Diagnoses: Principal Problem:   MDD (major depressive disorder), recurrent episode, moderate (HCC)   Current Medications:  Current Facility-Administered Medications  Medication Dose Route Frequency Provider Last Rate Last Admin   acetaminophen (TYLENOL) tablet 650 mg  650 mg Oral Q6H PRN Maryagnes Amos, FNP   650 mg at 05/07/23 1005   aspirin EC tablet 81 mg  81 mg Oral Daily Maryagnes Amos, FNP   81 mg at 05/10/23 0904   atorvastatin (LIPITOR) tablet 40 mg  40 mg Oral Daily Maryagnes Amos, FNP   40 mg at 05/10/23 0904   busPIRone (BUSPAR) tablet 5 mg  5 mg Oral BID Lewanda Rife, MD   5 mg at 05/10/23 7829   cyanocobalamin (VITAMIN B12) tablet 1,000 mcg  1,000 mcg Oral Daily Maryagnes Amos, FNP   1,000 mcg at 05/10/23 0904   escitalopram (LEXAPRO) tablet 20 mg  20 mg Oral QHS Dixon, Rashaun M, NP   20 mg at 05/09/23 2147   feeding supplement (ENSURE ENLIVE / ENSURE PLUS) liquid 237 mL  237 mL Oral TID BM Sarina Ill, DO   237 mL at 05/09/23 1451   gabapentin (NEURONTIN) capsule 200 mg  200 mg Oral QHS Lewanda Rife, MD   200 mg at 05/09/23 2147   haloperidol (HALDOL) tablet 5 mg  5 mg Oral TID PRN Maryagnes Amos, FNP       Or   haloperidol lactate (HALDOL) injection 5 mg  5 mg Intramuscular TID PRN Rosario Adie, Juel Burrow, FNP       hydrOXYzine (ATARAX) tablet 25 mg  25 mg Oral Q6H PRN Starkes-Perry, Juel Burrow, FNP       latanoprost (XALATAN) 0.005 % ophthalmic solution 1 drop  1 drop Left Eye QHS Maryagnes Amos, FNP   1 drop at 05/09/23 2149   LORazepam (ATIVAN) tablet 2 mg  2 mg Oral TID PRN Maryagnes Amos, FNP       Or   LORazepam (ATIVAN) injection 2 mg  2 mg Intramuscular TID PRN  Starkes-Perry, Juel Burrow, FNP       magnesium hydroxide (MILK OF MAGNESIA) suspension 30 mL  30 mL Oral Daily PRN Starkes-Perry, Juel Burrow, FNP       mirtazapine (REMERON) tablet 7.5 mg  7.5 mg Oral QHS Maryagnes Amos, FNP   7.5 mg at 05/09/23 2147   montelukast (SINGULAIR) tablet 10 mg  10 mg Oral Daily Maryagnes Amos, FNP   10 mg at 05/10/23 5621   multivitamin with minerals tablet 1 tablet  1 tablet Oral Daily Sarina Ill, DO   1 tablet at 05/10/23 0904   pantoprazole (PROTONIX) EC tablet 40 mg  40 mg Oral QAC breakfast Maryagnes Amos, FNP   40 mg at 05/10/23 3086   senna-docusate (Senokot-S) tablet 2 tablet  2 tablet Oral BID Maryagnes Amos, FNP   2 tablet at 05/08/23 2105   thiamine (VITAMIN B1) tablet 100 mg  100 mg Oral Daily Maryagnes Amos, FNP   100 mg at 05/10/23 0904   traZODone (DESYREL) tablet 25 mg  25 mg Oral QHS PRN Lewanda Rife, MD   25 mg at 05/09/23 2153   PTA Medications: Medications Prior to Admission  Medication Sig Dispense Refill Last Dose   albuterol (VENTOLIN  HFA) 108 (90 Base) MCG/ACT inhaler Inhale into the lungs.      aspirin EC 81 MG tablet Take 1 tablet (81 mg total) by mouth daily. Swallow whole.      escitalopram (LEXAPRO) 20 MG tablet Take 20 mg by mouth daily.      feeding supplement (ENSURE ENLIVE / ENSURE PLUS) LIQD Take 237 mLs by mouth 2 (two) times daily between meals.      LUMIGAN 0.01 % SOLN Place 1 drop into the left eye at bedtime.      ondansetron (ZOFRAN-ODT) 4 MG disintegrating tablet Take 4 mg by mouth every 6 (six) hours as needed.      pantoprazole (PROTONIX) 40 MG tablet Take 1 tablet (40 mg total) by mouth daily.      senna-docusate (SENOKOT-S) 8.6-50 MG tablet Take 2 tablets by mouth at bedtime as needed for mild constipation.      SIMBRINZA 1-0.2 % SUSP Place 1 drop into the left eye 2 (two) times daily.      [DISCONTINUED] atorvastatin (LIPITOR) 40 MG tablet Take 1 tablet (40 mg total) by  mouth daily.      [DISCONTINUED] cyanocobalamin 1000 MCG tablet Take 1 tablet (1,000 mcg total) by mouth daily.      [DISCONTINUED] hydrOXYzine (ATARAX) 25 MG tablet Take 1 tablet (25 mg total) by mouth every 6 (six) hours as needed for anxiety.      [DISCONTINUED] mirtazapine (REMERON) 7.5 MG tablet Take 1 tablet (7.5 mg total) by mouth at bedtime.       Patient Stressors: Financial difficulties   Health problems    Patient Strengths: Ability for insight  Capable of independent living  Manufacturing systems engineer  Motivation for treatment/growth   Treatment Modalities: Medication Management, Group therapy, Case management,  1 to 1 session with clinician, Psychoeducation, Recreational therapy.   Physician Treatment Plan for Primary Diagnosis: MDD (major depressive disorder), recurrent episode, moderate (HCC) Long Term Goal(s): Improvement in symptoms so as ready for discharge   Short Term Goals: Ability to identify changes in lifestyle to reduce recurrence of condition will improve Ability to verbalize feelings will improve Ability to disclose and discuss suicidal ideas Ability to demonstrate self-control will improve Ability to identify and develop effective coping behaviors will improve Compliance with prescribed medications will improve Ability to identify triggers associated with substance abuse/mental health issues will improve  Medication Management: Evaluate patient's response, side effects, and tolerance of medication regimen.  Therapeutic Interventions: 1 to 1 sessions, Unit Group sessions and Medication administration.  Evaluation of Outcomes: Adequate for Discharge  Physician Treatment Plan for Secondary Diagnosis: Principal Problem:   MDD (major depressive disorder), recurrent episode, moderate (HCC)  Long Term Goal(s): Improvement in symptoms so as ready for discharge   Short Term Goals: Ability to identify changes in lifestyle to reduce recurrence of condition will  improve Ability to verbalize feelings will improve Ability to disclose and discuss suicidal ideas Ability to demonstrate self-control will improve Ability to identify and develop effective coping behaviors will improve Compliance with prescribed medications will improve Ability to identify triggers associated with substance abuse/mental health issues will improve     Medication Management: Evaluate patient's response, side effects, and tolerance of medication regimen.  Therapeutic Interventions: 1 to 1 sessions, Unit Group sessions and Medication administration.  Evaluation of Outcomes: Adequate for Discharge   RN Treatment Plan for Primary Diagnosis: MDD (major depressive disorder), recurrent episode, moderate (HCC) Long Term Goal(s): Knowledge of disease and therapeutic regimen to maintain health  will improve  Short Term Goals: Ability to remain free from injury will improve, Ability to verbalize frustration and anger appropriately will improve, Ability to demonstrate self-control, Ability to participate in decision making will improve, Ability to verbalize feelings will improve, Ability to disclose and discuss suicidal ideas, Ability to identify and develop effective coping behaviors will improve, and Compliance with prescribed medications will improve  Medication Management: RN will administer medications as ordered by provider, will assess and evaluate patient's response and provide education to patient for prescribed medication. RN will report any adverse and/or side effects to prescribing provider.  Therapeutic Interventions: 1 on 1 counseling sessions, Psychoeducation, Medication administration, Evaluate responses to treatment, Monitor vital signs and CBGs as ordered, Perform/monitor CIWA, COWS, AIMS and Fall Risk screenings as ordered, Perform wound care treatments as ordered.  Evaluation of Outcomes: Adequate for Discharge   LCSW Treatment Plan for Primary Diagnosis: MDD (major  depressive disorder), recurrent episode, moderate (HCC) Long Term Goal(s): Safe transition to appropriate next level of care at discharge, Engage patient in therapeutic group addressing interpersonal concerns.  Short Term Goals: Engage patient in aftercare planning with referrals and resources, Increase social support, Increase ability to appropriately verbalize feelings, Increase emotional regulation, Facilitate acceptance of mental health diagnosis and concerns, and Increase skills for wellness and recovery  Therapeutic Interventions: Assess for all discharge needs, 1 to 1 time with Social worker, Explore available resources and support systems, Assess for adequacy in community support network, Educate family and significant other(s) on suicide prevention, Complete Psychosocial Assessment, Interpersonal group therapy.  Evaluation of Outcomes: Adequate for Discharge   Progress in Treatment: Attending groups: Yes. Participating in groups: Yes. Taking medication as prescribed: Yes. Toleration medication: Yes. Family/Significant other contact made: No, will contact:  CSW attempted to contacted son for SPE, unable to reach  [place], [phone]) Patient understands diagnosis: Yes. Discussing patient identified problems/goals with staff: Yes. Medical problems stabilized or resolved: Yes. Denies suicidal/homicidal ideation: Yes. Issues/concerns per patient self-inventory: No. Other: None    New problem(s) identified: No, Describe:  none Update 05/10/23: No changes at this time    New Short Term/Long Term Goal(s): detox, elimination of symptoms of psychosis, medication management for mood stabilization; elimination of SI thoughts; development of comprehensive mental wellness/sobriety plan. Update 05/10/23: No changes at this time    Patient Goals:  "get better so I can get my life back"Update 05/10/23: No changes at this time    Discharge Plan or Barriers: CSW to assist patient in  development of appropriate discharge plans.     Reason for Continuation of Hospitalization: Anxiety Depression    Estimated Length of Stay:  1-7 days Update 05/10/23: No changes at this time   Last 3 [place] Suicide Severity Risk Score: Flowsheet Row Admission (Current) from 05/04/2023 in [facility] to Hosp-Admission (Discharged) from 05/02/2023 in [facility]  C-SSRS RISK CATEGORY Moderate Risk No Risk       Last PHQ 2/9 Scores:     No data to display          Scribe for Treatment Team: Elza Rafter, Theresia Majors 05/10/2023 10:57 AM

## 2023-05-10 NOTE — [facility] Group Notes (Signed)
Adult Psychoeducational Group Note  Date:  05/10/2023 Time:  12:27 PM  Group Topic/Focus:  Goals Group:   The focus of this group is to help patients establish daily goals to achieve during treatment and discuss how the patient can incorporate goal setting into their daily lives to aide in recovery.  Participation Level:  Did Not Attend  Participation Quality:    Affect:    Cognitive:    Insight:   Engagement in Group:    Modes of Intervention:    Additional Comments:  Pt did not attend group   [employer] 05/10/2023, 12:27 PM

## 2023-05-10 NOTE — [facility] Suicide Risk Assessment (Signed)
[facility] INPATIENT:  Family/Significant Other Suicide Prevention Education  Suicide Prevention Education:  Contact Attempts: Holland Commons (son) [phone], (name of family member/significant other) has been identified by the patient as the family member/significant other with whom the patient will be residing, and identified as the person(s) who will aid the patient in the event of a mental health crisis.  With written consent from the patient, two attempts were made to provide suicide prevention education, prior to and/or following the patient's discharge.  We were unsuccessful in providing suicide prevention education.  A suicide education pamphlet was given to the patient to share with family/significant other.  Date and time of first attempt:08/14 Date and time of second attempt:08/19  Elza Rafter 05/10/2023, 10:06 AM

## 2023-05-10 NOTE — Progress Notes (Signed)
   05/10/23 1100  Spiritual Encounters  Type of Visit Follow up  Care provided to: Patient  Conversation partners present during encounter Nurse  Referral source Chaplain assessment  Reason for visit Routine spiritual support  OnCall Visit No  Spiritual Framework  Presenting Themes Impactful experiences and emotions;Courage hope and growth  Community/Connection Family  Patient Stress Factors Family relationships;Health changes  Family Stress Factors Family relationships;Health changes;Financial concerns  Interventions  Spiritual Care Interventions Made Compassionate presence;Reflective listening;Decision-making support/facilitation  Intervention Outcomes  Outcomes Awareness around self/spiritual resourses;Reduced anxiety;Awareness of support  Spiritual Care Plan  Spiritual Care Issues Still Outstanding No further spiritual care needs at this time (see row info)   Chaplain met with patient in the dayroom to check on her and see how she is doing. Patient shared with chaplain that her son was rushed to the hospital yesterday for a heart attack. Patient shared how distressed and upset she felt about that. Patient also shared that she is concerned about her 5 dogs that are at home alone. Patient stated that she  is glad that she is being discharged today so that she can check on her son and her dogs. Chaplain offered patient words of encouragement and well wishes.

## 2023-05-10 NOTE — [facility] Suicide Risk Assessment (Signed)
[facility] Discharge Suicide Risk Assessment   Principal Problem: MDD (major depressive disorder), recurrent episode, moderate (HCC) Discharge Diagnoses: Principal Problem:   MDD (major depressive disorder), recurrent episode, moderate (HCC)    Musculoskeletal: Strength & Muscle Tone: within normal limits Gait & Station: normal Patient leans: N/A   Psychiatric Specialty Exam:   Presentation  General Appearance:  Appropriate for Environment   Eye Contact: Good   Speech: Clear and Coherent   Speech Volume: Improved   Mood and Affect  Mood: " Better, excited to go home"   Affect: Congruent; more animated     Thought Process  Thought Processes: Coherent; Goal Directed; Linear   Descriptions of Associations:Intact   Orientation:Full (Time, Place and Person)   Thought Content:Abstract Reasoning; Logical   History of Schizophrenia/Schizoaffective disorder:No   Duration of Psychotic Symptoms:N/A   Hallucinations:Hallucinations: None   Ideas of Reference:None   Suicidal Thoughts: Denies, no intentions or plans   Homicidal Thoughts: No     Sensorium  Memory: Immediate Fair; Recent Fair; Remote Fair   Judgment: Fair   Insight: Fair     Art therapist  Concentration: Fair   Attention Span: Fair   Recall: [employer] of Knowledge: Fair   Language: Fair     Psychomotor Activity  Psychomotor Activity: Psychomotor Activity: Normal     Assets  Assets: Communication Skills; Desire for Improvement; Housing     Sleep  Sleep: Sleep: "Better"       Physical Exam: Physical Exam Constitutional:      Appearance: Normal appearance.  HENT:     Head: Normocephalic and atraumatic.     Nose: Nose normal.  Eyes:     Pupils: Pupils are equal, round, and reactive to light.  Cardiovascular:     Rate and Rhythm: Normal rate.  Pulmonary:     Effort: Pulmonary effort is normal.  Skin:    General: Skin is warm.  Neurological:      General: No focal deficit present.     Mental Status: She is alert and oriented to person, place, and time.      Review of Systems  HENT:  Negative for congestion, hearing loss and sinus pain.   Eyes:  Negative for blurred vision and double vision.  Respiratory:  Negative for cough and shortness of breath.   Cardiovascular:  Negative for chest pain and palpitations.  Gastrointestinal:  Negative for nausea and vomiting.  Neurological:  Negative for dizziness, sensory change, speech change and focal weakness.    Blood pressure 129/64, pulse 63, temperature 98.5 F (36.9 C), resp. rate 14, height 5\' 3"  (1.6 m), weight 52.4 kg, SpO2 99%. Body mass index is 20.46 kg/m.  Mental Status Per Nursing Assessment::   On Admission:  Suicidal ideation indicated by patient  Demographic Factors:  Caucasian and Unemployed  Loss Factors: Decrease in vocational status and Financial problems/change in socioeconomic status  Historical Factors: NA  Risk Reduction Factors:   Positive therapeutic relationship and Positive coping skills or problem solving skills  Continued Clinical Symptoms:  Dysthymia  Cognitive Features That Contribute To Risk:  Polarized thinking    Suicide Risk:  Minimal: No identifiable suicidal ideation.     Plan Of Care/Follow-up recommendations:  Per Discharge Summary  Lewanda Rife, MD

## 2023-05-10 NOTE — Group Note (Signed)
Date:  05/10/2023 Time:  1:37 AM  Group Topic/Focus:  Self Care:   The focus of this group is to help patients understand the importance of self-care in order to improve or restore emotional, physical, spiritual, interpersonal, and financial health.    Participation Level:  Active  Participation Quality:  Appropriate  Affect:  Appropriate  Cognitive:  Appropriate  Insight: Appropriate  Engagement in Group:  Engaged  Modes of Intervention:  Education  Additional Comments:    Garry Heater 05/10/2023, 1:37 AM

## 2023-05-10 NOTE — Plan of Care (Signed)
  Problem: Coping: Goal: Level of anxiety will decrease Outcome: Progressing   Problem: Elimination: Goal: Will not experience complications related to bowel motility Outcome: Progressing    Patient compliant with medications stated she is looking forward to discharge to go home and take care of her dog. Patient refused hs senokot-s  due to having good bowel movement this shift. Q 15 minutes safety checks ongoing. Patient contracted for safety denies SI/HI/A/VH.

## 2023-05-10 NOTE — [facility] Counselor (Signed)
CSW sent Discharge Summary note to [facility] (F:[phone])   Reynaldo Minium, MSW, [facility] 05/10/2023 1:07 PM

## 2023-05-10 NOTE — Plan of Care (Signed)
D: Pt alert and oriented. Pt denies depression however, endorses anxiety at this time. Pt goal: discharge. Pt denies experiencing any pain at this time. Pt denies experiencing any SI/HI, or AVH at this time.   A: Scheduled medications administered to pt, per MD orders. Support and encouragement provided. Frequent verbal contact made. Routine safety checks conducted q15 minutes.   R: No adverse drug reactions noted. Pt verbally contracts for safety at this time. Pt compliant with medications and treatment plan. Pt interacts well with others on the unit. Pt remains safe at this time. Plan of care ongoing.  Problem: Education: Goal: Knowledge of General Education information will improve Description: Including pain rating scale, medication(s)/side effects and non-pharmacologic comfort measures Outcome: Progressing   Problem: Activity: Goal: Risk for activity intolerance will decrease Outcome: Progressing

## 2023-05-11 NOTE — Telephone Encounter (Signed)
I called Julia Osborne to let her know her vitamin D resulted and was low which can be associated with worsening mood/depression.  She did not pick up. Thankfully ID's herself by first and last name on voicemail so left a message; will try x1 more to get in touch. Encouraged to f/u with PCP and consider supplementation.

## 2023-05-14 ENCOUNTER — Telehealth: Payer: Self-pay | Admitting: Psychiatry

## 2023-05-14 NOTE — Telephone Encounter (Signed)
I tried calling patient again re: vitamin D levels and was unable to reach them; left a similar message as last time.

## 2023-05-17 DIAGNOSIS — J449 Chronic obstructive pulmonary disease, unspecified: Secondary | ICD-10-CM | POA: Diagnosis not present

## 2023-05-17 DIAGNOSIS — E46 Unspecified protein-calorie malnutrition: Secondary | ICD-10-CM | POA: Diagnosis not present

## 2023-05-17 DIAGNOSIS — F331 Major depressive disorder, recurrent, moderate: Secondary | ICD-10-CM | POA: Diagnosis not present

## 2023-05-17 DIAGNOSIS — R471 Dysarthria and anarthria: Secondary | ICD-10-CM | POA: Diagnosis not present

## 2023-05-17 DIAGNOSIS — F4310 Post-traumatic stress disorder, unspecified: Secondary | ICD-10-CM | POA: Diagnosis not present

## 2023-05-17 DIAGNOSIS — E875 Hyperkalemia: Secondary | ICD-10-CM | POA: Diagnosis not present

## 2023-05-17 DIAGNOSIS — K5900 Constipation, unspecified: Secondary | ICD-10-CM | POA: Diagnosis not present

## 2023-05-17 DIAGNOSIS — F411 Generalized anxiety disorder: Secondary | ICD-10-CM | POA: Diagnosis not present

## 2023-05-17 DIAGNOSIS — H409 Unspecified glaucoma: Secondary | ICD-10-CM | POA: Diagnosis not present

## 2023-05-20 DIAGNOSIS — R4182 Altered mental status, unspecified: Secondary | ICD-10-CM | POA: Diagnosis not present

## 2023-05-20 DIAGNOSIS — R2689 Other abnormalities of gait and mobility: Secondary | ICD-10-CM | POA: Diagnosis not present

## 2023-05-20 DIAGNOSIS — R5381 Other malaise: Secondary | ICD-10-CM | POA: Diagnosis not present

## 2023-05-20 DIAGNOSIS — F331 Major depressive disorder, recurrent, moderate: Secondary | ICD-10-CM | POA: Diagnosis not present

## 2023-05-25 DIAGNOSIS — J449 Chronic obstructive pulmonary disease, unspecified: Secondary | ICD-10-CM | POA: Diagnosis not present

## 2023-05-25 DIAGNOSIS — F4310 Post-traumatic stress disorder, unspecified: Secondary | ICD-10-CM | POA: Diagnosis not present

## 2023-05-25 DIAGNOSIS — F411 Generalized anxiety disorder: Secondary | ICD-10-CM | POA: Diagnosis not present

## 2023-05-25 DIAGNOSIS — R471 Dysarthria and anarthria: Secondary | ICD-10-CM | POA: Diagnosis not present

## 2023-05-25 DIAGNOSIS — F331 Major depressive disorder, recurrent, moderate: Secondary | ICD-10-CM | POA: Diagnosis not present

## 2023-05-25 DIAGNOSIS — E46 Unspecified protein-calorie malnutrition: Secondary | ICD-10-CM | POA: Diagnosis not present

## 2023-05-25 DIAGNOSIS — H409 Unspecified glaucoma: Secondary | ICD-10-CM | POA: Diagnosis not present

## 2023-05-25 DIAGNOSIS — K5900 Constipation, unspecified: Secondary | ICD-10-CM | POA: Diagnosis not present

## 2023-05-25 DIAGNOSIS — E875 Hyperkalemia: Secondary | ICD-10-CM | POA: Diagnosis not present

## 2023-05-27 DIAGNOSIS — R6889 Other general symptoms and signs: Secondary | ICD-10-CM | POA: Diagnosis not present

## 2023-05-28 ENCOUNTER — Inpatient Hospital Stay: Payer: Medicare HMO | Attending: Internal Medicine

## 2023-05-28 DIAGNOSIS — Z95828 Presence of other vascular implants and grafts: Secondary | ICD-10-CM

## 2023-05-28 DIAGNOSIS — Z8542 Personal history of malignant neoplasm of other parts of uterus: Secondary | ICD-10-CM | POA: Diagnosis not present

## 2023-05-28 MED ORDER — SODIUM CHLORIDE 0.9% FLUSH
10.0000 mL | Freq: Once | INTRAVENOUS | Status: AC
  Administered 2023-05-28: 10 mL via INTRAVENOUS
  Filled 2023-05-28: qty 10

## 2023-05-28 MED ORDER — HEPARIN SOD (PORK) LOCK FLUSH 100 UNIT/ML IV SOLN
500.0000 [IU] | Freq: Once | INTRAVENOUS | Status: AC
  Administered 2023-05-28: 500 [IU] via INTRAVENOUS
  Filled 2023-05-28: qty 5

## 2023-06-04 DIAGNOSIS — F331 Major depressive disorder, recurrent, moderate: Secondary | ICD-10-CM | POA: Diagnosis not present

## 2023-06-04 DIAGNOSIS — K5900 Constipation, unspecified: Secondary | ICD-10-CM | POA: Diagnosis not present

## 2023-06-04 DIAGNOSIS — F4310 Post-traumatic stress disorder, unspecified: Secondary | ICD-10-CM | POA: Diagnosis not present

## 2023-06-04 DIAGNOSIS — E46 Unspecified protein-calorie malnutrition: Secondary | ICD-10-CM | POA: Diagnosis not present

## 2023-06-04 DIAGNOSIS — H409 Unspecified glaucoma: Secondary | ICD-10-CM | POA: Diagnosis not present

## 2023-06-04 DIAGNOSIS — F411 Generalized anxiety disorder: Secondary | ICD-10-CM | POA: Diagnosis not present

## 2023-06-04 DIAGNOSIS — R471 Dysarthria and anarthria: Secondary | ICD-10-CM | POA: Diagnosis not present

## 2023-06-04 DIAGNOSIS — J449 Chronic obstructive pulmonary disease, unspecified: Secondary | ICD-10-CM | POA: Diagnosis not present

## 2023-06-04 DIAGNOSIS — E875 Hyperkalemia: Secondary | ICD-10-CM | POA: Diagnosis not present

## 2023-06-05 DIAGNOSIS — E875 Hyperkalemia: Secondary | ICD-10-CM | POA: Diagnosis not present

## 2023-06-05 DIAGNOSIS — F4310 Post-traumatic stress disorder, unspecified: Secondary | ICD-10-CM | POA: Diagnosis not present

## 2023-06-05 DIAGNOSIS — F331 Major depressive disorder, recurrent, moderate: Secondary | ICD-10-CM | POA: Diagnosis not present

## 2023-06-05 DIAGNOSIS — H409 Unspecified glaucoma: Secondary | ICD-10-CM | POA: Diagnosis not present

## 2023-06-05 DIAGNOSIS — J449 Chronic obstructive pulmonary disease, unspecified: Secondary | ICD-10-CM | POA: Diagnosis not present

## 2023-06-05 DIAGNOSIS — K5900 Constipation, unspecified: Secondary | ICD-10-CM | POA: Diagnosis not present

## 2023-06-05 DIAGNOSIS — F411 Generalized anxiety disorder: Secondary | ICD-10-CM | POA: Diagnosis not present

## 2023-06-05 DIAGNOSIS — R471 Dysarthria and anarthria: Secondary | ICD-10-CM | POA: Diagnosis not present

## 2023-06-05 DIAGNOSIS — E46 Unspecified protein-calorie malnutrition: Secondary | ICD-10-CM | POA: Diagnosis not present

## 2023-07-03 NOTE — Progress Notes (Deleted)
Psychiatric Initial Adult Assessment   Patient Identification: Julia Osborne MRN:  [id number] Date of Evaluation:  07/03/2023 Referral Source: *** Chief Complaint:  No chief complaint on file.  Visit Diagnosis: No diagnosis found.  History of Present Illness:   Julia Osborne is a 62 y.o. year old female with a history of depression, anxiety, PTSD, stage IV endometrial cancer s/p radical hysterectomy, colon cancer, Lynch syndrome s/p colectomy,  COPD, hypertension, chronic pain, who is referred for depression.   According to the chart review, she was brought to ED with AMS and speech change in the context of decreased oral intake in the previous few weeks. CVA work up was negative. She was admitted to [facility] due to SI.  Medication- lexapro 20 mg daily, buspirone 5 mg twice a day, gabapentin 200 mg at night, trazodone 25 mg at night  [place] due  Recheck anemia  Associated Signs/Symptoms: Depression Symptoms:  {DEPRESSION SYMPTOMS:20000} (Hypo) Manic Symptoms:  {BHH MANIC SYMPTOMS:22872} Anxiety Symptoms:  {BHH ANXIETY SYMPTOMS:22873} Psychotic Symptoms:  {BHH PSYCHOTIC SYMPTOMS:22874} PTSD Symptoms: {BHH PTSD SYMPTOMS:22875}  Past Psychiatric History:  Outpatient:  Psychiatry admission:  Previous suicide attempt:  Past trials of medication:  History of violence:  History of head injury:   Previous Psychotropic Medications: {YES/NO:21197}  Substance Abuse History in the last 12 months:  {yes no:314532}  Consequences of Substance Abuse: {BHH CONSEQUENCES OF SUBSTANCE ABUSE:22880}  Past Medical History:  Past Medical History:  Diagnosis Date   Abdominal pain, acute, left upper quadrant    Abdominal pain, epigastric    Abnormal CT scan, sigmoid colon    Abnormal vaginal Pap smear    Adenocarcinoma of colon (HCC) 09/13/2015   Partial colon resection and chemo tx's.    Anemia    Anxiety    Anxiety disorder, unspecified    Aphasia    Asthma    Cancer (HCC)     endometrial; cancer cells in intestine   Chronic pain of left knee    Colon neoplasm    Constipation    Conversion reaction    COPD (chronic obstructive pulmonary disease) (HCC)    no definite diagnosis   Depression    Diverticulitis    Diverticulosis of colon without hemorrhage    Dyspareunia 05/16/2015   Dysphasia    Endometrial cancer (HCC)    Esophageal dysphagia    Family history of cancer    Family history of kidney cancer    Ganglion of left knee    Gastric intestinal metaplasia    GERD (gastroesophageal reflux disease)    Glaucoma    Hematochezia    History of colonic polyps    Hyperlipidemia    Indigestion    Lynch syndrome    Mucosal abnormality of stomach    Nausea and vomiting    Neuropathy    feet and hands   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 drug (HCC)    PONV (postoperative nausea and vomiting)    Pre-diabetes    Primary osteoarthritis of left knee    Rectal bleeding    Reflux esophagitis    Soft tissue swelling of knee joint    Uterine fibroid    UTI (urinary tract infection) 04/26/2023   on cipro   Vaginal dryness 05/16/2015   Vaginal itching    Vaginal Pap smear, abnormal     Past Surgical History:  Procedure Laterality Date   ABDOMINAL HYSTERECTOMY     APPENDECTOMY     BIOPSY N/A 03/14/2015   Procedure: BIOPSY;  Surgeon: Corbin Ade, MD;  Location: [facility];  Service: Endoscopy;  Laterality: N/A;  Gastric   COLONOSCOPY N/A 01/28/2017   Procedure: COLONOSCOPY;  Surgeon: Corbin Ade, MD;  Location[facility];  Service: Endoscopy;  Laterality: N/A;  11:30am   COLONOSCOPY WITH PROPOFOL N/A 03/14/2015   RMR: Internal hemorrhoids. colonic diverticulosis. Incomplete examination. Prepartation inadequate.   COLONOSCOPY WITH PROPOFOL N/A 07/04/2015   RMR: Colonic diverticulosis . Large polypoid lesion in the vicinity of the hepatic flexure status post saline-assisted piecmeal snare polypectomy  with ablation and tattooing as described. Sigmoid  polyp removed as described above. sigmoid colon polyp hyperplastic, hepatic flexure polyp with TA with focal high grade dysplasia    COLONOSCOPY WITH PROPOFOL N/A 11/03/2018   Procedure: COLONOSCOPY WITH PROPOFOL;  Surgeon: Wyline Mood, MD;  Location: [facility] ENDOSCOPY;  Service: Gastroenterology;  Laterality: N/A;   COLONOSCOPY WITH PROPOFOL N/A 12/09/2020   Procedure: COLONOSCOPY WITH PROPOFOL;  Surgeon: Wyline Mood, MD;  Location: [facility] ENDOSCOPY;  Service: Gastroenterology;  Laterality: N/A;  COVID POSITIVE 10/30/2020   COLONOSCOPY WITH PROPOFOL N/A 01/20/2021   Procedure: COLONOSCOPY WITH PROPOFOL;  Surgeon: Wyline Mood, MD;  Location: [facility] ENDOSCOPY;  Service: Gastroenterology;  Laterality: N/A;   COLONOSCOPY WITH PROPOFOL N/A 12/10/2021   Procedure: COLONOSCOPY WITH PROPOFOL;  Surgeon: Wyline Mood, MD;  Location: [facility] ENDOSCOPY;  Service: Gastroenterology;  Laterality: N/A;   COLONOSCOPY WITH PROPOFOL N/A 02/16/2023   Procedure: COLONOSCOPY WITH PROPOFOL;  Surgeon: Wyline Mood, MD;  Location: [facility] ENDOSCOPY;  Service: Gastroenterology;  Laterality: N/A;   ENTEROSCOPY N/A 02/12/2020   Procedure: ENTEROSCOPY;  Surgeon: Wyline Mood, MD;  Location: [facility] ENDOSCOPY;  Service: Gastroenterology;  Laterality: N/A;   ESOPHAGEAL DILATION N/A 03/14/2015   Procedure: ESOPHAGEAL DILATION;  Surgeon: Corbin Ade, MD;  Location: [facility];  Service: Endoscopy;  Laterality: N/AElease Hashimoto 54   ESOPHAGOGASTRODUODENOSCOPY N/A 05/19/2016   Procedure: ESOPHAGOGASTRODUODENOSCOPY (EGD);  Surgeon: Corbin Ade, MD;  [facility];  Service: Endoscopy;  Laterality: N/A;  215   ESOPHAGOGASTRODUODENOSCOPY N/A 01/28/2017   Procedure: ESOPHAGOGASTRODUODENOSCOPY (EGD);  Surgeon: Corbin Ade, MD;  [facility];  Service: Endoscopy;  Laterality: N/A;   ESOPHAGOGASTRODUODENOSCOPY N/A 12/10/2021   Procedure: ESOPHAGOGASTRODUODENOSCOPY (EGD);  Surgeon: Wyline Mood, MD;  Location: [facility] ENDOSCOPY;   Service: Gastroenterology;  Laterality: N/A;   ESOPHAGOGASTRODUODENOSCOPY (EGD) WITH PROPOFOL N/A 03/14/2015   RMR: Mild erosive reflux esophagitis status post passage o f a Maloney dilator. Abnormal gastric mucosa of uncertain significance as described above. status post biopsy, benign   ESOPHAGOGASTRODUODENOSCOPY (EGD) WITH PROPOFOL N/A 11/18/2017   Procedure: ESOPHAGOGASTRODUODENOSCOPY (EGD) WITH PROPOFOL;  Surgeon: Corbin Ade, MD;  [facility];  Service: Endoscopy;  Laterality: N/A;  12:15pm   ESOPHAGOGASTRODUODENOSCOPY (EGD) WITH PROPOFOL N/A 11/03/2018   Procedure: ESOPHAGOGASTRODUODENOSCOPY (EGD) WITH PROPOFOL;  Surgeon: Wyline Mood, MD;  Location: [facility] ENDOSCOPY;  Service: Gastroenterology;  Laterality: N/A;   ESOPHAGOGASTRODUODENOSCOPY (EGD) WITH PROPOFOL N/A 02/16/2023   Procedure: ESOPHAGOGASTRODUODENOSCOPY (EGD) WITH PROPOFOL;  Surgeon: Wyline Mood, MD;  Location: [facility] ENDOSCOPY;  Service: Gastroenterology;  Laterality: N/A;   GIVENS CAPSULE STUDY N/A 12/25/2019   Procedure: GIVENS CAPSULE STUDY;  Surgeon: Wyline Mood, MD;  Location: [facility] ENDOSCOPY;  Service: Gastroenterology;  Laterality: N/A;   MALONEY DILATION N/A 05/19/2016   Procedure: [name] DILATION;  Surgeon: Corbin Ade, MD;  [facility];  Service: Endoscopy;  Laterality: N/A;   MALONEY DILATION N/A 01/28/2017   Procedure: MALONEY DILATION;  Surgeon:  Rourk, Gerrit Friends, MD;  Location[facility];  Service: Endoscopy;  Laterality: N/A;   MALONEY DILATION N/A 11/18/2017   Procedure: [name] DILATION;  Surgeon: Corbin Ade, MD;  Location[facility];  Service: Endoscopy;  Laterality: N/A;   PARTIAL COLECTOMY  08/30/2015   polyp with adenocarcinoma   PARTIAL COLECTOMY  08/29/2018   POLYPECTOMY N/A 07/04/2015   Procedure: POLYPECTOMY;  Surgeon: Corbin Ade, MD;  Location: [facility];  Service: Endoscopy;  Laterality: N/A;   PORTACATH PLACEMENT Right 09/21/2014   TUBAL LIGATION       Family Psychiatric History: ***  Family History:  Family History  Problem Relation Age of Onset   Other Mother        clot that went to heart, deceased age 76ss   Heart attack Father        age 33s, deceased   Stroke Father    Cancer Father        "at death determined he was ate up with cancer"   Hypertension Sister    Kidney cancer Sister 16   Diabetes Brother    Hypertension Brother    Endometriosis Daughter    Heart attack Maternal Grandfather    Diabetes Sister    Brain cancer Paternal Aunt    Cancer Paternal Uncle        NOS   Cancer Paternal Uncle        NOS   Cancer Paternal Uncle        NOS   Colon cancer Neg Hx     Social History:   Social History   Socioeconomic History   Marital status: Widowed    Spouse name: Not on file   Number of children: 2   Years of education: Not on file   Highest education level: Not on file  Occupational History   Not on file  Tobacco Use   Smoking status: Never   Smokeless tobacco: Never  Vaping Use   Vaping status: Never Used  Substance and Sexual Activity   Alcohol use: No   Drug use: No   Sexual activity: Not Currently    Birth control/protection: Surgical    Comment: hyst  Other Topics Concern   Not on file  Social History Narrative   ** Merged History Encounter **       ** Data from: 04/28/23 Enc Dept: [facility]-PRE ADM TESTING   #  [place]; smoking- 1/2 ppd; no alcohol; used to run machines;   FHx-Dad- MI; ? Cancer on autopsy; sisters/aunts- brain; lung cancer x2; oldest sister- kidney cancer  Pt has 67 year old son;daughter 33 years. Brothers- states to have s   poken to her family re: importance of them being checked for lynch.        ** Data from: 05/04/23 Enc Dept: [facility]   Lives alone with 4 large dogs.    Social Determinants of Health   Financial Resource Strain: Medium Risk (12/07/2022)   Overall Financial Resource Strain (CARDIA)    Difficulty of Paying Living Expenses:  Somewhat hard  Food Insecurity: Food Insecurity Present (05/04/2023)   Hunger Vital Sign    Worried About Running Out of Food in the Last Year: Sometimes true    Ran Out of Food in the Last Year: Sometimes true  Transportation Needs: No Transportation Needs (05/04/2023)   PRAPARE - Administrator, Civil Service (Medical): No    Lack of Transportation (Non-Medical): No  Physical Activity: Inactive (12/07/2022)  Exercise Vital Sign    Days of Exercise per Week: 0 days    Minutes of Exercise per Session: 0 min  Stress: Stress Concern Present (12/07/2022)   [employer] of Occupational Health - Occupational Stress Questionnaire    Feeling of Stress : To some extent  Social Connections: Socially Isolated (12/07/2022)   Social Connection and Isolation Panel [NHANES]    Frequency of Communication with Friends and Family: Once a week    Frequency of Social Gatherings with Friends and Family: Never    Attends Religious Services: Never    Database administrator or Organizations: No    Attends Banker Meetings: Never    Marital Status: Widowed    Additional Social History: ***  Allergies:   Allergies  Allergen Reactions   Codeine Rash   Codeine Nausea And Vomiting and Rash    Metabolic Disorder Labs: Lab Results  Component Value Date   HGBA1C 5.9 (H) 05/03/2023   MPG 123 05/03/2023   No results found for: "PROLACTIN" Lab Results  Component Value Date   CHOL 149 05/03/2023   TRIG 66 05/03/2023   HDL 49 05/03/2023   CHOLHDL 3.0 05/03/2023   VLDL 13 05/03/2023   LDLCALC 87 05/03/2023   Lab Results  Component Value Date   TSH 1.194 05/02/2023    Therapeutic Level Labs: No results found for: "LITHIUM" No results found for: "CBMZ" No results found for: "VALPROATE"  Current Medications: Current Outpatient Medications  Medication Sig Dispense Refill   albuterol (PROVENTIL HFA;VENTOLIN HFA) 108 (90 Base) MCG/ACT inhaler Inhale 2 puffs into the  lungs every 2 (two) hours as needed for wheezing or shortness of breath (cough). 1 Inhaler 3   albuterol (PROVENTIL) (2.5 MG/3ML) 0.083% nebulizer solution Take 3 mLs (2.5 mg total) by nebulization every 6 (six) hours as needed for wheezing or shortness of breath. 75 mL 12   albuterol (VENTOLIN HFA) 108 (90 Base) MCG/ACT inhaler Inhale into the lungs.     aspirin EC 81 MG tablet Take 1 tablet (81 mg total) by mouth daily. Swallow whole. 30 tablet 12   atorvastatin (LIPITOR) 40 MG tablet Take 1 tablet (40 mg total) by mouth daily. 30 tablet 0   busPIRone (BUSPAR) 5 MG tablet Take 1 tablet (5 mg total) by mouth 2 (two) times daily. 60 tablet 0   ciprofloxacin (CIPRO) 500 MG tablet Take 500 mg by mouth 2 (two) times daily. X 10 days     escitalopram (LEXAPRO) 20 MG tablet Take 20 mg by mouth at bedtime.     escitalopram (LEXAPRO) 20 MG tablet Take 1 tablet (20 mg total) by mouth at bedtime. 30 tablet 0   ezetimibe (ZETIA) 10 MG tablet Take 10 mg by mouth at bedtime.     fluticasone (FLONASE) 50 MCG/ACT nasal spray Place 1 spray into both nostrils daily. 1 g 0   gabapentin (NEURONTIN) 100 MG capsule Take 2 capsules (200 mg total) by mouth at bedtime. 60 capsule 0   gabapentin (NEURONTIN) 300 MG capsule Take 300 mg by mouth at bedtime.     Glycerin, PF, (OPTASE COMFORT DRY EYE) 1 % SOLN Place 1 drop into both eyes daily at 6 (six) AM.     hydrOXYzine (ATARAX) 25 MG tablet Take 1 tablet (25 mg total) by mouth every 6 (six) hours as needed for anxiety. 30 tablet 0   latanoprost (XALATAN) 0.005 % ophthalmic solution Place 1 drop into the left eye at bedtime. 2.5 mL 12  loratadine (CLARITIN) 10 MG tablet Take 10 mg by mouth daily as needed for allergies.      LUMIGAN 0.01 % SOLN Place 1 drop into the right eye at bedtime.     meclizine (ANTIVERT) 25 MG tablet Take 1 tablet (25 mg total) by mouth 3 (three) times daily as needed for dizziness. 30 tablet 0   meloxicam (MOBIC) 7.5 MG tablet Take 7.5 mg by  mouth daily.     mirtazapine (REMERON) 7.5 MG tablet Take 1 tablet (7.5 mg total) by mouth at bedtime. 30 tablet 0   montelukast (SINGULAIR) 10 MG tablet Take 10 mg by mouth daily as needed.     montelukast (SINGULAIR) 10 MG tablet Take 1 tablet (10 mg total) by mouth daily. 30 tablet 0   Naphazoline-Pheniramine (ALLERGY EYE OP) Place 1-2 drops into both eyes 2 (two) times daily as needed (ITCHY, WATERY EYES).     pantoprazole (PROTONIX) 40 MG tablet Take 1 tablet (40 mg total) by mouth daily before breakfast. 15 tablet 0   Plecanatide (TRULANCE) 3 MG TABS Take 1 tablet by mouth daily. (Patient taking differently: Take 1 tablet by mouth daily as needed.) 30 tablet 6   senna-docusate (SENOKOT-S) 8.6-50 MG tablet Take 2 tablets by mouth 2 (two) times daily. 30 tablet 0   SIMBRINZA 1-0.2 % SUSP Apply 1 drop to eye 2 (two) times daily.     SIMBRINZA 1-0.2 % SUSP Place 1 drop into the left eye 2 (two) times daily.     sodium chloride (OCEAN) 0.65 % SOLN nasal spray Place 1 spray into both nostrils as needed for congestion.     traZODone (DESYREL) 50 MG tablet Take 50 mg by mouth at bedtime.     traZODone (DESYREL) 50 MG tablet Take 0.5 tablets (25 mg total) by mouth at bedtime as needed for sleep. 30 tablet 0   No current facility-administered medications for this visit.    Musculoskeletal: Strength & Muscle Tone: within normal limits Gait & Station: normal Patient leans: N/A  Psychiatric Specialty Exam: Review of Systems  There were no vitals taken for this visit.There is no height or weight on file to calculate BMI.  General Appearance: {Appearance:22683}  Eye Contact:  {BHH EYE CONTACT:22684}  Speech:  Clear and Coherent  Volume:  Normal  Mood:  {BHH MOOD:22306}  Affect:  {Affect (PAA):22687}  Thought Process:  Coherent  Orientation:  Full (Time, Place, and Person)  Thought Content:  Logical  Suicidal Thoughts:  {ST/HT (PAA):22692}  Homicidal Thoughts:  {ST/HT (PAA):22692}  Memory:   Immediate;   Good  Judgement:  {Judgement (PAA):22694}  Insight:  {Insight (PAA):22695}  Psychomotor Activity:  Normal  Concentration:  Concentration: Good and Attention Span: Good  Recall:  Good  Fund of Knowledge:Good  Language: Good  Akathisia:  No  Handed:  Right  AIMS (if indicated):  not done  Assets:  Communication Skills Desire for Improvement  ADL's:  Intact  Cognition: WNL  Sleep:  {BHH GOOD/FAIR/POOR:22877}   Screenings: AUDIT    Flowsheet Row Admission (Discharged) from 05/04/2023 in [facility]  Alcohol Use Disorder Identification Test Final Score (AUDIT) 0      PHQ2-9    Flowsheet Row Clinical Support from 12/07/2022 in [facility] Total Score 4  PHQ-9 Total Score 11      Flowsheet Row Admission (Discharged) from 05/04/2023 in [facility] BEHAVIORAL [facility]IC[facility] ED to Hosp-Admission (Discharged) from 05/02/2023 in [place]  [facility] ED from 04/26/2023 in [facility]  C-SSRS RISK CATEGORY Moderate Risk No Risk No Risk       Assessment and Plan:  Assessment  Plan   The patient demonstrates the following risk factors for suicide: Chronic risk factors for suicide include: {Chronic Risk Factors for WJXBJYN:82956213}. Acute risk factors for suicide include: {Acute Risk Factors for YQMVHQI:69629528}. Protective factors for this patient include: {Protective Factors for Suicide UXLK:44010272}. Considering these factors, the overall suicide risk at this point appears to be {Desc; low/moderate/high:110033}. Patient {ACTION; IS/IS ZDG:64403474} appropriate for outpatient follow up.   Collaboration of Care: {BH OP Collaboration of Care:21014065}  Patient/Guardian was advised Release of Information must be obtained prior to any record release in order to collaborate their care with an outside provider. Patient/Guardian was advised if they have not already  done so to contact the registration department to sign all necessary forms in order for [place] to release information regarding their care.   Consent: Patient/Guardian gives verbal consent for treatment and assignment of benefits for services provided during this visit. Patient/Guardian expressed understanding and agreed to proceed.   Neysa Hotter, MD 10/12/20242:40 PM

## 2023-07-07 DIAGNOSIS — Z23 Encounter for immunization: Secondary | ICD-10-CM | POA: Diagnosis not present

## 2023-07-07 DIAGNOSIS — E785 Hyperlipidemia, unspecified: Secondary | ICD-10-CM | POA: Diagnosis not present

## 2023-07-07 DIAGNOSIS — L259 Unspecified contact dermatitis, unspecified cause: Secondary | ICD-10-CM | POA: Diagnosis not present

## 2023-07-08 ENCOUNTER — Ambulatory Visit: Payer: Medicare HMO | Admitting: Psychiatry

## 2023-07-28 ENCOUNTER — Other Ambulatory Visit: Payer: Medicare HMO

## 2023-07-28 ENCOUNTER — Ambulatory Visit: Payer: Medicare HMO | Admitting: Internal Medicine

## 2023-08-03 ENCOUNTER — Inpatient Hospital Stay: Payer: Medicare HMO | Admitting: Internal Medicine

## 2023-08-03 ENCOUNTER — Inpatient Hospital Stay: Payer: Medicare HMO | Attending: Internal Medicine

## 2023-08-17 ENCOUNTER — Inpatient Hospital Stay: Payer: Medicare HMO | Admitting: Internal Medicine

## 2023-08-17 ENCOUNTER — Inpatient Hospital Stay: Payer: Medicare HMO

## 2023-08-17 NOTE — Progress Notes (Deleted)
[facility]  Patient Care Team: Hamrick, Durward Fortes, MD as PCP - General (Family Medicine) Penland, Novella Olive, MD (Inactive) as PCP - Hematology/Oncology (Hematology and Oncology) Corbin Ade, MD as Consulting Physician (Gastroenterology) Earna Coder, MD as Consulting Physician (Hematology and Oncology) Hamrick, Durward Fortes, MD (Family Medicine)  CHIEF COMPLAINTS/PURPOSE OF CONSULTATION:  Endometrial cancer  #  Oncology History Overview Note  # OCT 2015- Uterine ca- STAGE IV [multiple lung nodules]; Nov 2015S/p radical hysterectomy, BSO, pelvic lymph node dissection/vaginal biopsies/extensive pelvic disease including positive lymph nodes and positive distal vaginal biopsy.]; [WFB]-endometroid G-2; LVI; positive Pelvic LN/vaginal Bx Jan-April 2016- carbo-Taxol x6 cycles [facility]; Dr.Penland]  # Oct 2016- colo- high grade dysplasia [Dr.Rourk; Dr.Jenkins s/p right hemi-colectomy]; last colo- may 2018;    # Genetic testing- [MSH6 mutation]/Lynch syndrome; EGD/colo every 2-3 qyears; urin cytology q6M  Histologic grade G2, histologic type endometrioid adenocarcinoma -----------------------------------------------------  DIAGNOSIS: Endometrioid endometrial cancer  STAGE: IV  ;GOALS: Control/palliative  CURRENT/MOST RECENT THERAPY surveillance    Endometrial cancer (HCC)  07/12/2014 Imaging   CT C/A/P, pelvic adenopathy, multiple pulmonary nodules concerning for metastatic disease   07/30/2014 Initial Diagnosis   Endometrial cancer   07/31/2014 Pathology Results   endometrioid adenocarcinoma, G2, lymphovascular invasion present, positive pelvic lymph node and vaginal biopsy   07/31/2014 Definitive Surgery   radical hysterectomy, BSO, pelvic lymph node dissection and vaginal biopsies   09/25/2014 Procedure   Port-A-Cath placement in IR   10/18/2014 - 01/10/2015 Chemotherapy   Carboplatin/Taxol. First cycle given at [facility].  S/P 6 cycles  total, 5 cycles given at [facility].   07/04/2015 Procedure   Colonoscopy by Dr. Jena Gauss   07/04/2015 Pathology Results   Colon, polyp(s), vicinity of hepatic flexure - TUBULAR ADENOMA WITH FOCAL HIGH GRADE DYSPLASIA.    Procedure   Partial colectomy by Dr. Lovell Sheehan scheduled for 08/30/2015   10/31/2015 Genetic Testing   [facility] SYNDROME MSH6 Mutation   04/27/2016 Imaging   CT CAP- No acute process or evidence of metastatic disease in the chest. Right middle lobe pulmonary nodule is unchanged back to 11/20/2014, favoring a benign etiology   11/10/2016 Imaging   CT CAP- 1. No evidence of metastatic disease in the chest, abdomen or pelvis. 2. No evidence of local tumor recurrence at the ileocolic anastomosis in the right abdomen or in the pelvis. Decreased mild fat stranding at the base of the right mesentery, most consistent with postsurgical scarring. 3. Aortic atherosclerosis.   Adenocarcinoma of colon (HCC)  07/04/2015 Pathology Results   Diagnosis 1. Colon, polyp(s), vicinity of hepatic flexure - TUBULAR ADENOMA WITH FOCAL HIGH GRADE DYSPLASIA. - NO INVASIVE CARCINOMA. 2. Colon, polyp(s), sigmoid - HYPERPLASTIC POLYP. - NO DYSPLASIA OR MALIGNANCY.   07/04/2015 Procedure   Colonoscopoy by Dr. Jena Gauss- large polypoid colonic lesion.   08/30/2015 Definitive Surgery   Dr. Lovell Sheehan- right segmental colon resection   08/30/2015 Pathology Results   [id number] intramucosal adenocarcinoma of colon, 0.2 cm inding the laminal propria with negative resection margins and 0/17 lymph nodes.    HISTORY OF PRESENTING ILLNESS: Patient ambulating-independently.   Alone.   Julia Osborne 62 y.o.  female history of endometrial cancer stage IV/Lynch syndrome-current on surveillance is here for follow-up/a nd review the results- CT scan.  t states appetite is up and down. MD took her off all anxiety medications. She does feel anxious. Thinks she is losing weight. Awaiting endoscopies.   Denies Nausea. Has fatigue.  No pelvic pain. No  vaginal discharge or bleeding.   Has a ganglian cyst that was drained recently behind left knee.   Patient denies any worsening shortness of breath or cough.  Appetite is good.  No weight loss.  Denies abdominal pain.  Unfortunately continues to smoke. No blood in stools or black or stools.  No blood in urine.  Review of Systems  Constitutional:  Positive for malaise/fatigue. Negative for chills, diaphoresis, fever and weight loss.  HENT:  Negative for sore throat.   Eyes:  Negative for double vision.  Respiratory:  Negative for cough, hemoptysis, sputum production, shortness of breath and wheezing.   Cardiovascular:  Negative for chest pain, palpitations, orthopnea and leg swelling.  Gastrointestinal:  Negative for blood in stool, constipation, diarrhea, heartburn, melena, nausea and vomiting.  Genitourinary:  Negative for dysuria, frequency and urgency.  Musculoskeletal:  Positive for back pain and joint pain.  Skin: Negative.  Negative for itching and rash.  Neurological:  Positive for tingling. Negative for dizziness, focal weakness, weakness and headaches.  Endo/Heme/Allergies:  Does not bruise/bleed easily.  Psychiatric/Behavioral:  Negative for depression. The patient is not nervous/anxious and does not have insomnia.      MEDICAL HISTORY:  Past Medical History:  Diagnosis Date   Abdominal pain, acute, left upper quadrant    Abdominal pain, epigastric    Abnormal CT scan, sigmoid colon    Abnormal vaginal Pap smear    Adenocarcinoma of colon (HCC) 09/13/2015   Partial colon resection and chemo tx's.    Anemia    Anxiety    Anxiety disorder, unspecified    Aphasia    Asthma    Cancer (HCC)    endometrial; cancer cells in intestine   Chronic pain of left knee    Colon neoplasm    Constipation    Conversion reaction    COPD (chronic obstructive pulmonary disease) (HCC)    no definite diagnosis   Depression    Diverticulitis    Diverticulosis of  colon without hemorrhage    Dyspareunia 05/16/2015   Dysphasia    Endometrial cancer (HCC)    Esophageal dysphagia    Family history of cancer    Family history of kidney cancer    Ganglion of left knee    Gastric intestinal metaplasia    GERD (gastroesophageal reflux disease)    Glaucoma    Hematochezia    History of colonic polyps    Hyperlipidemia    Indigestion    Lynch syndrome    Mucosal abnormality of stomach    Nausea and vomiting    Neuropathy    feet and hands   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 drug (HCC)    PONV (postoperative nausea and vomiting)    Pre-diabetes    Primary osteoarthritis of left knee    Rectal bleeding    Reflux esophagitis    Soft tissue swelling of knee joint    Uterine fibroid    UTI (urinary tract infection) 04/26/2023   on cipro   Vaginal dryness 05/16/2015   Vaginal itching    Vaginal Pap smear, abnormal     SURGICAL HISTORY: Past Surgical History:  Procedure Laterality Date   ABDOMINAL HYSTERECTOMY     APPENDECTOMY     BIOPSY N/A 03/14/2015   Procedure: BIOPSY;  Surgeon: Corbin Ade, MD;  Location: [facility];  Service: Endoscopy;  Laterality: N/A;  Gastric   COLONOSCOPY N/A 01/28/2017   Procedure: COLONOSCOPY;  Surgeon: Corbin Ade, MD;  Location: [facility];  Service: Endoscopy;  Laterality: N/A;  11:30am   COLONOSCOPY WITH PROPOFOL N/A 03/14/2015   RMR: Internal hemorrhoids. colonic diverticulosis. Incomplete examination. Prepartation inadequate.   COLONOSCOPY WITH PROPOFOL N/A 07/04/2015   RMR: Colonic diverticulosis . Large polypoid lesion in the vicinity of the hepatic flexure status post saline-assisted piecmeal snare polypectomy  with ablation and tattooing as described. Sigmoid polyp removed as described above. sigmoid colon polyp hyperplastic, hepatic flexure polyp with TA with focal high grade dysplasia    COLONOSCOPY WITH PROPOFOL N/A 11/03/2018   Procedure: COLONOSCOPY WITH PROPOFOL;  Surgeon: Wyline Mood, MD;   Location: [facility] ENDOSCOPY;  Service: Gastroenterology;  Laterality: N/A;   COLONOSCOPY WITH PROPOFOL N/A 12/09/2020   Procedure: COLONOSCOPY WITH PROPOFOL;  Surgeon: Wyline Mood, MD;  Location: [facility] ENDOSCOPY;  Service: Gastroenterology;  Laterality: N/A;  COVID POSITIVE 10/30/2020   COLONOSCOPY WITH PROPOFOL N/A 01/20/2021   Procedure: COLONOSCOPY WITH PROPOFOL;  Surgeon: Wyline Mood, MD;  Location: [facility] ENDOSCOPY;  Service: Gastroenterology;  Laterality: N/A;   COLONOSCOPY WITH PROPOFOL N/A 12/10/2021   Procedure: COLONOSCOPY WITH PROPOFOL;  Surgeon: Wyline Mood, MD;  Location: [facility] ENDOSCOPY;  Service: Gastroenterology;  Laterality: N/A;   COLONOSCOPY WITH PROPOFOL N/A 02/16/2023   Procedure: COLONOSCOPY WITH PROPOFOL;  Surgeon: Wyline Mood, MD;  Location: [facility] ENDOSCOPY;  Service: Gastroenterology;  Laterality: N/A;   ENTEROSCOPY N/A 02/12/2020   Procedure: ENTEROSCOPY;  Surgeon: Wyline Mood, MD;  Location: [facility] ENDOSCOPY;  Service: Gastroenterology;  Laterality: N/A;   ESOPHAGEAL DILATION N/A 03/14/2015   Procedure: ESOPHAGEAL DILATION;  Surgeon: Corbin Ade, MD;  Location: [facility];  Service: Endoscopy;  Laterality: N/AElease Hashimoto 54   ESOPHAGOGASTRODUODENOSCOPY N/A 05/19/2016   Procedure: ESOPHAGOGASTRODUODENOSCOPY (EGD);  Surgeon: Corbin Ade, MD;  [facility];  Service: Endoscopy;  Laterality: N/A;  215   ESOPHAGOGASTRODUODENOSCOPY N/A 01/28/2017   Procedure: ESOPHAGOGASTRODUODENOSCOPY (EGD);  Surgeon: Corbin Ade, MD;  [facility];  Service: Endoscopy;  Laterality: N/A;   ESOPHAGOGASTRODUODENOSCOPY N/A 12/10/2021   Procedure: ESOPHAGOGASTRODUODENOSCOPY (EGD);  Surgeon: Wyline Mood, MD;  Location: [facility] ENDOSCOPY;  Service: Gastroenterology;  Laterality: N/A;   ESOPHAGOGASTRODUODENOSCOPY (EGD) WITH PROPOFOL N/A 03/14/2015   RMR: Mild erosive reflux esophagitis status post passage o f a Maloney dilator. Abnormal gastric mucosa of uncertain significance as  described above. status post biopsy, benign   ESOPHAGOGASTRODUODENOSCOPY (EGD) WITH PROPOFOL N/A 11/18/2017   Procedure: ESOPHAGOGASTRODUODENOSCOPY (EGD) WITH PROPOFOL;  Surgeon: Corbin Ade, MD;  [facility];  Service: Endoscopy;  Laterality: N/A;  12:15pm   ESOPHAGOGASTRODUODENOSCOPY (EGD) WITH PROPOFOL N/A 11/03/2018   Procedure: ESOPHAGOGASTRODUODENOSCOPY (EGD) WITH PROPOFOL;  Surgeon: Wyline Mood, MD;  Location: [facility] ENDOSCOPY;  Service: Gastroenterology;  Laterality: N/A;   ESOPHAGOGASTRODUODENOSCOPY (EGD) WITH PROPOFOL N/A 02/16/2023   Procedure: ESOPHAGOGASTRODUODENOSCOPY (EGD) WITH PROPOFOL;  Surgeon: Wyline Mood, MD;  Location: [facility] ENDOSCOPY;  Service: Gastroenterology;  Laterality: N/A;   GIVENS CAPSULE STUDY N/A 12/25/2019   Procedure: GIVENS CAPSULE STUDY;  Surgeon: Wyline Mood, MD;  Location: [facility] ENDOSCOPY;  Service: Gastroenterology;  Laterality: N/A;   MALONEY DILATION N/A 05/19/2016   Procedure: [name] DILATION;  Surgeon: Corbin Ade, MD;  [facility];  Service: Endoscopy;  Laterality: N/A;   MALONEY DILATION N/A 01/28/2017   Procedure: [name] DILATION;  Surgeon: Corbin Ade, MD;  [facility];  Service: Endoscopy;  Laterality: N/A;   MALONEY DILATION N/A 11/18/2017   Procedure: [name] DILATION;  Surgeon: Corbin Ade, MD;  [facility];  Service: Endoscopy;  Laterality: N/A;   PARTIAL COLECTOMY  08/30/2015   polyp with adenocarcinoma   PARTIAL COLECTOMY  08/29/2018   POLYPECTOMY N/A 07/04/2015   Procedure: POLYPECTOMY;  Surgeon: Corbin Ade, MD;  Location: [facility];  Service: Endoscopy;  Laterality: N/A;   PORTACATH PLACEMENT Right 09/21/2014   TUBAL LIGATION      SOCIAL HISTORY: Social History   Socioeconomic History   Marital status: Widowed    Spouse name: Not on file   Number of children: 2   Years of education: Not on file   Highest education level: Not on file  Occupational History   Not on  file  Tobacco Use   Smoking status: Never   Smokeless tobacco: Never  Vaping Use   Vaping status: Never Used  Substance and Sexual Activity   Alcohol use: No   Drug use: No   Sexual activity: Not Currently    Birth control/protection: Surgical    Comment: hyst  Other Topics Concern   Not on file  Social History Narrative   ** Merged History Encounter **       ** Data from: 04/28/23 Enc Dept: [facility]-PRE ADM TESTING   #  [place]; smoking- 1/2 ppd; no alcohol; used to run machines;   FHx-Dad- MI; ? Cancer on autopsy; sisters/aunts- brain; lung cancer x2; oldest sister- kidney cancer  Pt has 60 year old son;daughter 33 years. Brothers- states to have s   poken to her family re: importance of them being checked for lynch.        ** Data from: 05/04/23 Enc Dept: [facility]   Lives alone with 4 large dogs.    Social Determinants of Health   Financial Resource Strain: Medium Risk (12/07/2022)   Overall Financial Resource Strain (CARDIA)    Difficulty of Paying Living Expenses: Somewhat hard  Food Insecurity: Food Insecurity Present (05/04/2023)   Hunger Vital Sign    Worried About Running Out of Food in the Last Year: Sometimes true    Ran Out of Food in the Last Year: Sometimes true  Transportation Needs: No Transportation Needs (05/04/2023)   PRAPARE - Administrator, Civil Service (Medical): No    Lack of Transportation (Non-Medical): No  Physical Activity: Inactive (12/07/2022)   Exercise Vital Sign    Days of Exercise per Week: 0 days    Minutes of Exercise per Session: 0 min  Stress: Stress Concern Present (12/07/2022)   [employer] of Occupational Health - Occupational Stress Questionnaire    Feeling of Stress : To some extent  Social Connections: Socially Isolated (12/07/2022)   Social Connection and Isolation Panel [NHANES]    Frequency of Communication with Friends and Family: Once a week    Frequency of Social Gatherings with Friends and  Family: Never    Attends Religious Services: Never    Database administrator or Organizations: No    Attends Banker Meetings: Never    Marital Status: Widowed  Intimate Partner Violence: Not At Risk (05/04/2023)   Humiliation, Afraid, Rape, and Kick questionnaire    Fear of Current or Ex-Partner: No    Emotionally Abused: No    Physically Abused: No    Sexually Abused: No    FAMILY HISTORY:  Family History  Problem Relation Age of Onset   Other Mother        clot that went to heart, deceased age 78ss   Heart attack Father  age 34s, deceased   Stroke Father    Cancer Father        "at death determined he was ate up with cancer"   Hypertension Sister    Kidney cancer Sister 33   Diabetes Brother    Hypertension Brother    Endometriosis Daughter    Heart attack Maternal Grandfather    Diabetes Sister    Brain cancer Paternal Aunt    Cancer Paternal Uncle        NOS   Cancer Paternal Uncle        NOS   Cancer Paternal Uncle        NOS   Colon cancer Neg Hx     ALLERGIES:  is allergic to codeine and codeine.  MEDICATIONS:  Current Outpatient Medications  Medication Sig Dispense Refill   albuterol (PROVENTIL HFA;VENTOLIN HFA) 108 (90 Base) MCG/ACT inhaler Inhale 2 puffs into the lungs every 2 (two) hours as needed for wheezing or shortness of breath (cough). 1 Inhaler 3   albuterol (PROVENTIL) (2.5 MG/3ML) 0.083% nebulizer solution Take 3 mLs (2.5 mg total) by nebulization every 6 (six) hours as needed for wheezing or shortness of breath. 75 mL 12   albuterol (VENTOLIN HFA) 108 (90 Base) MCG/ACT inhaler Inhale into the lungs.     aspirin EC 81 MG tablet Take 1 tablet (81 mg total) by mouth daily. Swallow whole. 30 tablet 12   atorvastatin (LIPITOR) 40 MG tablet Take 1 tablet (40 mg total) by mouth daily. 30 tablet 0   busPIRone (BUSPAR) 5 MG tablet Take 1 tablet (5 mg total) by mouth 2 (two) times daily. 60 tablet 0   ciprofloxacin (CIPRO) 500 MG tablet  Take 500 mg by mouth 2 (two) times daily. X 10 days     escitalopram (LEXAPRO) 20 MG tablet Take 20 mg by mouth at bedtime.     escitalopram (LEXAPRO) 20 MG tablet Take 1 tablet (20 mg total) by mouth at bedtime. 30 tablet 0   ezetimibe (ZETIA) 10 MG tablet Take 10 mg by mouth at bedtime.     fluticasone (FLONASE) 50 MCG/ACT nasal spray Place 1 spray into both nostrils daily. 1 g 0   gabapentin (NEURONTIN) 100 MG capsule Take 2 capsules (200 mg total) by mouth at bedtime. 60 capsule 0   gabapentin (NEURONTIN) 300 MG capsule Take 300 mg by mouth at bedtime.     Glycerin, PF, (OPTASE COMFORT DRY EYE) 1 % SOLN Place 1 drop into both eyes daily at 6 (six) AM.     hydrOXYzine (ATARAX) 25 MG tablet Take 1 tablet (25 mg total) by mouth every 6 (six) hours as needed for anxiety. 30 tablet 0   latanoprost (XALATAN) 0.005 % ophthalmic solution Place 1 drop into the left eye at bedtime. 2.5 mL 12   loratadine (CLARITIN) 10 MG tablet Take 10 mg by mouth daily as needed for allergies.      LUMIGAN 0.01 % SOLN Place 1 drop into the right eye at bedtime.     meclizine (ANTIVERT) 25 MG tablet Take 1 tablet (25 mg total) by mouth 3 (three) times daily as needed for dizziness. 30 tablet 0   meloxicam (MOBIC) 7.5 MG tablet Take 7.5 mg by mouth daily.     mirtazapine (REMERON) 7.5 MG tablet Take 1 tablet (7.5 mg total) by mouth at bedtime. 30 tablet 0   montelukast (SINGULAIR) 10 MG tablet Take 10 mg by mouth daily as needed.     montelukast (  SINGULAIR) 10 MG tablet Take 1 tablet (10 mg total) by mouth daily. 30 tablet 0   Naphazoline-Pheniramine (ALLERGY EYE OP) Place 1-2 drops into both eyes 2 (two) times daily as needed (ITCHY, WATERY EYES).     pantoprazole (PROTONIX) 40 MG tablet Take 1 tablet (40 mg total) by mouth daily before breakfast. 15 tablet 0   Plecanatide (TRULANCE) 3 MG TABS Take 1 tablet by mouth daily. (Patient taking differently: Take 1 tablet by mouth daily as needed.) 30 tablet 6    senna-docusate (SENOKOT-S) 8.6-50 MG tablet Take 2 tablets by mouth 2 (two) times daily. 30 tablet 0   SIMBRINZA 1-0.2 % SUSP Apply 1 drop to eye 2 (two) times daily.     SIMBRINZA 1-0.2 % SUSP Place 1 drop into the left eye 2 (two) times daily.     sodium chloride (OCEAN) 0.65 % SOLN nasal spray Place 1 spray into both nostrils as needed for congestion.     traZODone (DESYREL) 50 MG tablet Take 50 mg by mouth at bedtime.     traZODone (DESYREL) 50 MG tablet Take 0.5 tablets (25 mg total) by mouth at bedtime as needed for sleep. 30 tablet 0   No current facility-administered medications for this visit.      Marland Kitchen  PHYSICAL EXAMINATION: ECOG PERFORMANCE STATUS: 0 - Asymptomatic  There were no vitals filed for this visit.  There were no vitals filed for this visit.   Physical Exam Constitutional:      Comments: Patient is alone.  HENT:     Head: Normocephalic and atraumatic.     Mouth/Throat:     Pharynx: No oropharyngeal exudate.  Eyes:     Pupils: Pupils are equal, round, and reactive to light.  Cardiovascular:     Rate and Rhythm: Normal rate and regular rhythm.  Pulmonary:     Effort: No respiratory distress.     Breath sounds: No wheezing.  Abdominal:     General: Bowel sounds are normal. There is no distension.     Palpations: Abdomen is soft. There is no mass.     Tenderness: There is no abdominal tenderness. There is no guarding or rebound.  Musculoskeletal:        General: No tenderness. Normal range of motion.     Cervical back: Normal range of motion and neck supple.  Skin:    General: Skin is warm.     Comments:    Neurological:     Mental Status: She is alert and oriented to person, place, and time.  Psychiatric:        Mood and Affect: Affect normal.      LABORATORY DATA:  I have reviewed the data as listed Lab Results  Component Value Date   WBC 9.4 05/03/2023   HGB 11.2 (L) 05/03/2023   HCT 32.8 (L) 05/03/2023   MCV 91.1 05/03/2023   PLT 206  05/03/2023   Recent Labs    01/25/23 1257 04/26/23 1324 05/02/23 1019 05/02/23 1028 05/03/23 0051  NA 140 138 140 141 137  K 3.4* 3.6 3.3* 3.2* 3.7  CL 104 104 105 103 103  CO2 26 25 26   --  25  GLUCOSE 126* 108* 115* 114* 114*  BUN 9 9 6  4* 12  CREATININE 0.75 0.91 0.78 0.70 1.03*  CALCIUM 9.0 9.2 8.9  --  8.4*  GFRNONAA >60 >60 >60  --  >60  PROT 7.2 7.2 6.1*  --   --   ALBUMIN 4.4  4.3 3.7  --   --   AST 14* 12* 13*  --   --   ALT 10 14 8   --   --   ALKPHOS 47 43 40  --   --   BILITOT 0.3 0.9 0.7  --   --   BILIDIR  --  <0.1  --   --   --   IBILI  --  NOT CALCULATED  --   --   --     RADIOGRAPHIC STUDIES: I have personally reviewed the radiological images as listed and agreed with the findings in the report. No results found.  ASSESSMENT & PLAN:   Endometrial cancer (HCC) # Uterine Cancer endometrioid type grade 2; stage IV [lung nodules at presentation 2015/]-APRIL 30th, 2024- - CT scan abdomen pelvis/chest-negative for any obvious recurrence.   Stable. No clinical evidence of recurrence.   # Lynch syndrome Aundra Dubin of high-grade polyp/status post colectomy: Last colonoscopy-May 2022; awaiting EGD/colo [Dr.Anna]- 28th, may 2024 repeat on annual basis;  EGD- May 2021--reviewed.stable;  Urine cytology pending. Mammogram May 2024- pending.   #Active smoker on Wellbutrin-discussed smoking cessation; DECLINES.   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   # Constipation- on amitizia-stable.   # port flush- q 3 M  DISPOSITION: # port flush every 3 months # follow up in 6 months/labs;port flush-- cbc/cmp/ca-125/urine cytology; Dr.B  # I reviewed the blood work- with the patient in detail; also reviewed the imaging independently [as summarized above]; and with the patient in detail.     All questions were answered. The patient knows to call the clinic with any problems, questions or concerns.     Earna Coder, MD 08/17/2023 8:54  AM

## 2023-08-17 NOTE — Assessment & Plan Note (Deleted)
#  Uterine Cancer endometrioid type grade 2; stage IV [lung nodules at presentation 2015/]-APRIL 30th, 2024- - CT scan abdomen pelvis/chest-negative for any obvious recurrence.   Stable. No clinical evidence of recurrence.   # Lynch syndrome Aundra Dubin of high-grade polyp/status post colectomy: Last colonoscopy-May 2022; awaiting EGD/colo [Dr.Anna]- 28th, may 2024 repeat on annual basis;  EGD- May 2021--reviewed.stable;  Urine cytology pending. Mammogram May 2024- pending.   #Active smoker on Wellbutrin-discussed smoking cessation; DECLINES.   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   # Constipation- on amitizia-stable.   # port flush- q 3 M  DISPOSITION: # port flush every 3 months # follow up in 6 months/labs;port flush-- cbc/cmp/ca-125/urine cytology; Dr.B  # I reviewed the blood work- with the patient in detail; also reviewed the imaging independently [as summarized above]; and with the patient in detail.

## 2023-08-31 ENCOUNTER — Emergency Department (HOSPITAL_COMMUNITY): Payer: Medicare HMO

## 2023-08-31 ENCOUNTER — Encounter (HOSPITAL_COMMUNITY): Payer: Self-pay

## 2023-08-31 ENCOUNTER — Emergency Department (HOSPITAL_COMMUNITY)
Admission: EM | Admit: 2023-08-31 | Discharge: 2023-08-31 | Disposition: A | Discharge status: Home / Self Care | Payer: Medicare HMO

## 2023-08-31 ENCOUNTER — Other Ambulatory Visit: Payer: Self-pay

## 2023-08-31 DIAGNOSIS — R4781 Slurred speech: Secondary | ICD-10-CM | POA: Diagnosis not present

## 2023-08-31 DIAGNOSIS — R0789 Other chest pain: Secondary | ICD-10-CM | POA: Diagnosis not present

## 2023-08-31 DIAGNOSIS — R7309 Other abnormal glucose: Secondary | ICD-10-CM | POA: Diagnosis not present

## 2023-08-31 DIAGNOSIS — R791 Abnormal coagulation profile: Secondary | ICD-10-CM | POA: Insufficient documentation

## 2023-08-31 DIAGNOSIS — Z7982 Long term (current) use of aspirin: Secondary | ICD-10-CM | POA: Insufficient documentation

## 2023-08-31 DIAGNOSIS — G43809 Other migraine, not intractable, without status migrainosus: Secondary | ICD-10-CM | POA: Diagnosis not present

## 2023-08-31 DIAGNOSIS — R079 Chest pain, unspecified: Secondary | ICD-10-CM | POA: Diagnosis not present

## 2023-08-31 DIAGNOSIS — G4489 Other headache syndrome: Secondary | ICD-10-CM | POA: Diagnosis not present

## 2023-08-31 DIAGNOSIS — R200 Anesthesia of skin: Secondary | ICD-10-CM | POA: Diagnosis not present

## 2023-08-31 DIAGNOSIS — B349 Viral infection, unspecified: Secondary | ICD-10-CM | POA: Insufficient documentation

## 2023-08-31 DIAGNOSIS — I10 Essential (primary) hypertension: Secondary | ICD-10-CM | POA: Diagnosis not present

## 2023-08-31 DIAGNOSIS — R29810 Facial weakness: Secondary | ICD-10-CM | POA: Diagnosis not present

## 2023-08-31 DIAGNOSIS — Z20822 Contact with and (suspected) exposure to covid-19: Secondary | ICD-10-CM | POA: Insufficient documentation

## 2023-08-31 DIAGNOSIS — R9431 Abnormal electrocardiogram [ECG] [EKG]: Secondary | ICD-10-CM | POA: Diagnosis not present

## 2023-08-31 DIAGNOSIS — I6782 Cerebral ischemia: Secondary | ICD-10-CM | POA: Diagnosis not present

## 2023-08-31 DIAGNOSIS — R519 Headache, unspecified: Secondary | ICD-10-CM | POA: Diagnosis present

## 2023-08-31 DIAGNOSIS — I6522 Occlusion and stenosis of left carotid artery: Secondary | ICD-10-CM | POA: Diagnosis not present

## 2023-08-31 LAB — DIFFERENTIAL
Abs Immature Granulocytes: 0.03 10*3/uL (ref 0.00–0.07)
Basophils Absolute: 0 10*3/uL (ref 0.0–0.1)
Basophils Relative: 0 %
Eosinophils Absolute: 0 10*3/uL (ref 0.0–0.5)
Eosinophils Relative: 0 %
Immature Granulocytes: 0 %
Lymphocytes Relative: 28 %
Lymphs Abs: 2.1 10*3/uL (ref 0.7–4.0)
Monocytes Absolute: 0.5 10*3/uL (ref 0.1–1.0)
Monocytes Relative: 6 %
Neutro Abs: 4.9 10*3/uL (ref 1.7–7.7)
Neutrophils Relative %: 66 %

## 2023-08-31 LAB — APTT: aPTT: 30 s (ref 24–36)

## 2023-08-31 LAB — I-STAT CHEM 8, ED
BUN: 15 mg/dL (ref 6–20)
Calcium, Ion: 1.12 mmol/L — ABNORMAL LOW (ref 1.15–1.40)
Chloride: 103 mmol/L (ref 98–111)
Creatinine, Ser: 0.8 mg/dL (ref 0.44–1.00)
Glucose, Bld: 109 mg/dL — ABNORMAL HIGH (ref 70–99)
HCT: 36 % (ref 36.0–46.0)
Hemoglobin: 12.2 g/dL (ref 12.0–15.0)
Potassium: 3.5 mmol/L (ref 3.5–5.1)
Sodium: 140 mmol/L (ref 135–145)
TCO2: 25 mmol/L (ref 22–32)

## 2023-08-31 LAB — COMPREHENSIVE METABOLIC PANEL
ALT: 22 U/L (ref 0–44)
AST: 20 U/L (ref 15–41)
Albumin: 4 g/dL (ref 3.5–5.0)
Alkaline Phosphatase: 39 U/L (ref 38–126)
Anion gap: 9 (ref 5–15)
BUN: 14 mg/dL (ref 6–20)
CO2: 24 mmol/L (ref 22–32)
Calcium: 9.4 mg/dL (ref 8.9–10.3)
Chloride: 105 mmol/L (ref 98–111)
Creatinine, Ser: 0.81 mg/dL (ref 0.44–1.00)
GFR, Estimated: >60 mL/min (ref >60)
Glucose, Bld: 109 mg/dL — ABNORMAL HIGH (ref 70–99)
Potassium: 3.5 mmol/L (ref 3.5–5.1)
Sodium: 138 mmol/L (ref 135–145)
Total Bilirubin: 0.8 mg/dL (ref <1.2)
Total Protein: 6.4 g/dL — ABNORMAL LOW (ref 6.5–8.1)

## 2023-08-31 LAB — PROTIME-INR
INR: 1 (ref 0.8–1.2)
Prothrombin Time: 13.9 s (ref 11.4–15.2)

## 2023-08-31 LAB — URINALYSIS, ROUTINE W REFLEX MICROSCOPIC
Bacteria, UA: NONE SEEN
Bilirubin Urine: NEGATIVE
Glucose, UA: NEGATIVE mg/dL
Ketones, ur: NEGATIVE mg/dL
Leukocytes,Ua: NEGATIVE
Nitrite: NEGATIVE
Protein, ur: NEGATIVE mg/dL
Specific Gravity, Urine: >1.046 — ABNORMAL HIGH (ref 1.005–1.030)
pH: 7 (ref 5.0–8.0)

## 2023-08-31 LAB — CBC
HCT: 36.9 % (ref 36.0–46.0)
Hemoglobin: 13.1 g/dL (ref 12.0–15.0)
MCH: 31 pg (ref 26.0–34.0)
MCHC: 35.5 g/dL (ref 30.0–36.0)
MCV: 87.2 fL (ref 80.0–100.0)
Platelets: 194 10*3/uL (ref 150–400)
RBC: 4.23 MIL/uL (ref 3.87–5.11)
RDW: 12.6 % (ref 11.5–15.5)
WBC: 7.6 10*3/uL (ref 4.0–10.5)
nRBC: 0 % (ref 0.0–0.2)

## 2023-08-31 LAB — CBG MONITORING, ED: Glucose-Capillary: 104 mg/dL — ABNORMAL HIGH (ref 70–99)

## 2023-08-31 LAB — RESP PANEL BY RT-PCR (RSV, FLU A&B, COVID)  RVPGX2
Influenza A by PCR: NEGATIVE
Influenza B by PCR: NEGATIVE
Resp Syncytial Virus by PCR: NEGATIVE
SARS Coronavirus 2 by RT PCR: NEGATIVE

## 2023-08-31 LAB — ETHANOL: Alcohol, Ethyl (B): <10 mg/dL (ref <10)

## 2023-08-31 MED ORDER — SODIUM CHLORIDE 0.9 % IV BOLUS
1000.0000 mL | Freq: Once | INTRAVENOUS | Status: AC
  Administered 2023-08-31: 1000 mL via INTRAVENOUS

## 2023-08-31 MED ORDER — PROCHLORPERAZINE EDISYLATE 10 MG/2ML IJ SOLN
10.0000 mg | Freq: Once | INTRAMUSCULAR | Status: AC
  Administered 2023-08-31: 10 mg via INTRAVENOUS
  Filled 2023-08-31: qty 2

## 2023-08-31 MED ORDER — KETOROLAC TROMETHAMINE 15 MG/ML IJ SOLN
15.0000 mg | Freq: Once | INTRAMUSCULAR | Status: AC
  Administered 2023-08-31: 15 mg via INTRAVENOUS
  Filled 2023-08-31: qty 1

## 2023-08-31 MED ORDER — IOHEXOL 350 MG/ML SOLN
75.0000 mL | Freq: Once | INTRAVENOUS | Status: AC | PRN
  Administered 2023-08-31: 75 mL via INTRAVENOUS

## 2023-08-31 MED ORDER — SODIUM CHLORIDE 0.9% FLUSH
3.0000 mL | Freq: Once | INTRAVENOUS | Status: AC
  Administered 2023-08-31: 3 mL via INTRAVENOUS

## 2023-08-31 MED ORDER — ACETAMINOPHEN 325 MG PO TABS
325.0000 mg | ORAL_TABLET | Freq: Once | ORAL | Status: AC
  Administered 2023-08-31: 325 mg via ORAL
  Filled 2023-08-31: qty 1

## 2023-08-31 NOTE — ED Notes (Signed)
Patient ambulated to the restroom unassisted.

## 2023-08-31 NOTE — Code Documentation (Signed)
Stroke Response Nurse Documentation Code Documentation  LATERA WHILLOCK is a 62 y.o. female arriving to [facility]  via [facility] on 08/31/2023 with past medical hx significant of PTSD, anxiety, depression, hyperlipidemia, conversion reaction and dysarthria. On aspirin 81 mg daily. Code stroke was activated by EMS.   Patient reports feeling a "pop" on the left side of her head around 1000. Was noted to have slurred speech, left arm and leg weakness, and facial droop at that time. She also reports balance disturbances the past few days and waking up this morning at 0530 with difficulty walking and vomiting. States she believes she has a UTI but has not been tested to confirm yet.   Stroke team at the bedside on patient arrival. Labs drawn and patient cleared for CT by EDP. Patient to CT with team. NIHSS 1, see documentation for details and code stroke times. Patient with left arm weakness on exam. Slow to respond at times. The following imaging was completed:  CT Head and CTA. Patient is not a candidate for IV Thrombolytic due to out of window. Patient is not a candidate for IR due to no LVO.   Care Plan: Q2 VS and NIHSS.   Bedside handoff with ED RN.    Ferman Hamming Stroke Response RN

## 2023-08-31 NOTE — Consult Note (Addendum)
NEUROLOGY CONSULT NOTE   Date of service: August 31, 2023 Patient Name: Julia Osborne MRN:  [id number] DOB:  1961-02-28 Chief Complaint: "Headache, nausea, UTI symptoms, left-sided weakness" Requesting Provider: Coral Spikes, DO  History of Present Illness  Julia Osborne is a 62 y.o. female with a history of colon and endometrial cancer, hyperlipidemia, prediabetes. Patient states that she has had ongoing balance problems for a few days and also has had urinary frequency, burning, and urgency concerning for a UTI overnight.  Patient states that she woke up at 530 this morning with a nonspecific headache, dizziness, nausea, and vomiting to which she attributes to a suspected UTI.  She called a nurse line at her provider's office to attempt to be seen for her UTI symptoms, nausea, and vomiting when she complained of a sudden popping in her left head with associated chest pain, left arm, face, and leg weakness and numbness for which the nursing line suggested EMS activation for evaluation of stroke-like symptoms.   Of note, the patient has had multiple presentations in the past presenting with stroke-like symptoms including in 2015 when she presented with intermittent inability to speak and confusion.  Evaluation was felt to have a significant functional overlay including retained ability to write despite having difficulty speaking and effort dependent strength exam.  Again in August of 2024, the patient presented as a code stroke for evaluation of altered mental status, confusion, and nonsensical speech and work up at that time for delirium and stroke including MRI brain (small cavernoma, otherwise unremarkable), EEG, nutritional labs, and reversible causes of encephalopathy was largely unremarkable except for vitamin B12 deficiency at 177.   LKW: "I have been having balance problems for a few days" Modified rankin score: 0-Completely asymptomatic and back to baseline post- stroke IV Thrombolysis:  No, patient symptoms reportedly began a few days ago, patient presented outside of the thrombolytic therapy time window. Also, presentation is highly consistent with functional neurologic disorder with nonphysiologic findings.  EVT:  No, presentation is not consistent with an LVO  NIHSS components Score: Comment  1a Level of Conscious 0[x]  1[]  2[]  3[]      1b LOC Questions 0[x]  1[]  2[]       1c LOC Commands 0[x]  1[]  2[]       2 Best Gaze 0[x]  1[]  2[]       3 Visual 0[x]  1[]  2[]  3[]      4 Facial Palsy 0[x]  1[]  2[]  3[]      5a Motor Arm - left 0[]  1[x]  2[]  3[]  4[]  UN[]    5b Motor Arm - Right 0[x]  1[]  2[]  3[]  4[]  UN[]    6a Motor Leg - Left 0[x]  1[]  2[]  3[]  4[]  UN[]    6b Motor Leg - Right 0[x]  1[]  2[]  3[]  4[]  UN[]    7 Limb Ataxia 0[x]  1[]  2[]  3[]  UN[]     8 Sensory 0[]  1[x]  2[]  UN[]      9 Best Language 0[x]  1[]  2[]  3[]      10 Dysarthria 0[x]  1[]  2[]  UN[]      11 Extinct. and Inattention 0[x]  1[]  2[]       TOTAL: 2     ROS  Comprehensive ROS performed and pertinent positives documented in HPI   Past History   Past Medical History:  Diagnosis Date   Abdominal pain, acute, left upper quadrant    Abdominal pain, epigastric    Abnormal CT scan, sigmoid colon    Abnormal vaginal Pap smear    Adenocarcinoma of colon (HCC) 09/13/2015   Partial colon  resection and chemo tx's.    Anemia    Anxiety    Anxiety disorder, unspecified    Aphasia    Asthma    Cancer (HCC)    endometrial; cancer cells in intestine   Chronic pain of left knee    Colon neoplasm    Constipation    Conversion reaction    COPD (chronic obstructive pulmonary disease) (HCC)    no definite diagnosis   Depression    Diverticulitis    Diverticulosis of colon without hemorrhage    Dyspareunia 05/16/2015   Dysphasia    Endometrial cancer (HCC)    Esophageal dysphagia    Family history of cancer    Family history of kidney cancer    Ganglion of left knee    Gastric intestinal metaplasia    GERD (gastroesophageal  reflux disease)    Glaucoma    Hematochezia    History of colonic polyps    Hyperlipidemia    Indigestion    Lynch syndrome    Mucosal abnormality of stomach    Nausea and vomiting    Neuropathy    feet and hands   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 drug (HCC)    PONV (postoperative nausea and vomiting)    Pre-diabetes    Primary osteoarthritis of left knee    Rectal bleeding    Reflux esophagitis    Soft tissue swelling of knee joint    Uterine fibroid    UTI (urinary tract infection) 04/26/2023   on cipro   Vaginal dryness 05/16/2015   Vaginal itching    Vaginal Pap smear, abnormal    Past Surgical History:  Procedure Laterality Date   ABDOMINAL HYSTERECTOMY     APPENDECTOMY     BIOPSY N/A 03/14/2015   Procedure: BIOPSY;  Surgeon: Corbin Ade, MD;  Location: [facility];  Service: Endoscopy;  Laterality: N/A;  Gastric   COLONOSCOPY N/A 01/28/2017   Procedure: COLONOSCOPY;  Surgeon: Corbin Ade, MD;  Location: [facility];  Service: Endoscopy;  Laterality: N/A;  11:30am   COLONOSCOPY WITH PROPOFOL N/A 03/14/2015   RMR: Internal hemorrhoids. colonic diverticulosis. Incomplete examination. Prepartation inadequate.   COLONOSCOPY WITH PROPOFOL N/A 07/04/2015   RMR: Colonic diverticulosis . Large polypoid lesion in the vicinity of the hepatic flexure status post saline-assisted piecmeal snare polypectomy  with ablation and tattooing as described. Sigmoid polyp removed as described above. sigmoid colon polyp hyperplastic, hepatic flexure polyp with TA with focal high grade dysplasia    COLONOSCOPY WITH PROPOFOL N/A 11/03/2018   Procedure: COLONOSCOPY WITH PROPOFOL;  Surgeon: Wyline Mood, MD;  Location: [facility] ENDOSCOPY;  Service: Gastroenterology;  Laterality: N/A;   COLONOSCOPY WITH PROPOFOL N/A 12/09/2020   Procedure: COLONOSCOPY WITH PROPOFOL;  Surgeon: Wyline Mood, MD;  Location: [facility] ENDOSCOPY;  Service: Gastroenterology;  Laterality: N/A;  COVID POSITIVE 10/30/2020    COLONOSCOPY WITH PROPOFOL N/A 01/20/2021   Procedure: COLONOSCOPY WITH PROPOFOL;  Surgeon: Wyline Mood, MD;  Location: [facility] ENDOSCOPY;  Service: Gastroenterology;  Laterality: N/A;   COLONOSCOPY WITH PROPOFOL N/A 12/10/2021   Procedure: COLONOSCOPY WITH PROPOFOL;  Surgeon: Wyline Mood, MD;  Location: [facility] ENDOSCOPY;  Service: Gastroenterology;  Laterality: N/A;   COLONOSCOPY WITH PROPOFOL N/A 02/16/2023   Procedure: COLONOSCOPY WITH PROPOFOL;  Surgeon: Wyline Mood, MD;  Location: [facility] ENDOSCOPY;  Service: Gastroenterology;  Laterality: N/A;   ENTEROSCOPY N/A 02/12/2020   Procedure: ENTEROSCOPY;  Surgeon: Wyline Mood, MD;  Location: [facility] ENDOSCOPY;  Service: Gastroenterology;  Laterality: N/A;   ESOPHAGEAL DILATION  N/A 03/14/2015   Procedure: ESOPHAGEAL DILATION;  Surgeon: Corbin Ade, MD;  Location: [facility];  Service: Endoscopy;  Laterality: N/AElease Hashimoto 54   ESOPHAGOGASTRODUODENOSCOPY N/A 05/19/2016   Procedure: ESOPHAGOGASTRODUODENOSCOPY (EGD);  Surgeon: Corbin Ade, MD;  Location: [facility];  Service: Endoscopy;  Laterality: N/A;  215   ESOPHAGOGASTRODUODENOSCOPY N/A 01/28/2017   Procedure: ESOPHAGOGASTRODUODENOSCOPY (EGD);  Surgeon: Corbin Ade, MD;  Location: [facility];  Service: Endoscopy;  Laterality: N/A;   ESOPHAGOGASTRODUODENOSCOPY N/A 12/10/2021   Procedure: ESOPHAGOGASTRODUODENOSCOPY (EGD);  Surgeon: Wyline Mood, MD;  Location[facility] ENDOSCOPY;  Service: Gastroenterology;  Laterality: N/A;   ESOPHAGOGASTRODUODENOSCOPY (EGD) WITH PROPOFOL N/A 03/14/2015   RMR: Mild erosive reflux esophagitis status post passage o f a Maloney dilator. Abnormal gastric mucosa of uncertain significance as described above. status post biopsy, benign   ESOPHAGOGASTRODUODENOSCOPY (EGD) WITH PROPOFOL N/A 11/18/2017   Procedure: ESOPHAGOGASTRODUODENOSCOPY (EGD) WITH PROPOFOL;  Surgeon: Corbin Ade, MD;  [facility]: [facility];  Service: Endoscopy;  Laterality: N/A;  12:15pm    ESOPHAGOGASTRODUODENOSCOPY (EGD) WITH PROPOFOL N/A 11/03/2018   Procedure: ESOPHAGOGASTRODUODENOSCOPY (EGD) WITH PROPOFOL;  Surgeon: Wyline Mood, MD;  Location: [facility] ENDOSCOPY;  Service: Gastroenterology;  Laterality: N/A;   ESOPHAGOGASTRODUODENOSCOPY (EGD) WITH PROPOFOL N/A 02/16/2023   Procedure: ESOPHAGOGASTRODUODENOSCOPY (EGD) WITH PROPOFOL;  Surgeon: Wyline Mood, MD;  Location: [facility] ENDOSCOPY;  Service: Gastroenterology;  Laterality: N/A;   GIVENS CAPSULE STUDY N/A 12/25/2019   Procedure: GIVENS CAPSULE STUDY;  Surgeon: Wyline Mood, MD;  Location: [facility] ENDOSCOPY;  Service: Gastroenterology;  Laterality: N/A;   MALONEY DILATION N/A 05/19/2016   Procedure: [name] DILATION;  Surgeon: Corbin Ade, MD;  Location: [facility];  Service: Endoscopy;  Laterality: N/A;   MALONEY DILATION N/A 01/28/2017   Procedure: [name] DILATION;  Surgeon: Corbin Ade, MD;  Location: [facility];  Service: Endoscopy;  Laterality: N/A;   MALONEY DILATION N/A 11/18/2017   Procedure: [name] DILATION;  Surgeon: Corbin Ade, MD;  Location: [facility];  Service: Endoscopy;  Laterality: N/A;   PARTIAL COLECTOMY  08/30/2015   polyp with adenocarcinoma   PARTIAL COLECTOMY  08/29/2018   POLYPECTOMY N/A 07/04/2015   Procedure: POLYPECTOMY;  Surgeon: Corbin Ade, MD;  Location: [facility];  Service: Endoscopy;  Laterality: N/A;   PORTACATH PLACEMENT Right 09/21/2014   TUBAL LIGATION     Family History: Family History  Problem Relation Age of Onset   Other Mother        clot that went to heart, deceased age 22ss   Heart attack Father        age 43s, deceased   Stroke Father    Cancer Father        "at death determined he was ate up with cancer"   Hypertension Sister    Kidney cancer Sister 40   Diabetes Brother    Hypertension Brother    Endometriosis Daughter    Heart attack Maternal Grandfather    Diabetes Sister    Brain cancer Paternal Aunt    Cancer Paternal Uncle        NOS   Cancer  Paternal Uncle        NOS   Cancer Paternal Uncle        NOS   Colon cancer Neg Hx    Social History  reports that she has never smoked. She has never used smokeless tobacco. She reports that she does not drink alcohol and does not use drugs.  Allergies  Allergen Reactions   Codeine Rash   Codeine Nausea And Vomiting and Rash   Medications  No current facility-administered medications for this encounter.  Current Outpatient Medications:    albuterol (PROVENTIL HFA;VENTOLIN HFA) 108 (90 Base) MCG/ACT inhaler, Inhale 2 puffs into the lungs every 2 (two) hours as needed for wheezing or shortness of breath (cough)., Disp: 1 Inhaler, Rfl: 3   albuterol (PROVENTIL) (2.5 MG/3ML) 0.083% nebulizer solution, Take 3 mLs (2.5 mg total) by nebulization every 6 (six) hours as needed for wheezing or shortness of breath., Disp: 75 mL, Rfl: 12   albuterol (VENTOLIN HFA) 108 (90 Base) MCG/ACT inhaler, Inhale into the lungs., Disp: , Rfl:    aspirin EC 81 MG tablet, Take 1 tablet (81 mg total) by mouth daily. Swallow whole., Disp: 30 tablet, Rfl: 12   atorvastatin (LIPITOR) 40 MG tablet, Take 1 tablet (40 mg total) by mouth daily., Disp: 30 tablet, Rfl: 0   busPIRone (BUSPAR) 5 MG tablet, Take 1 tablet (5 mg total) by mouth 2 (two) times daily., Disp: 60 tablet, Rfl: 0   ciprofloxacin (CIPRO) 500 MG tablet, Take 500 mg by mouth 2 (two) times daily. X 10 days, Disp: , Rfl:    escitalopram (LEXAPRO) 20 MG tablet, Take 20 mg by mouth at bedtime., Disp: , Rfl:    escitalopram (LEXAPRO) 20 MG tablet, Take 1 tablet (20 mg total) by mouth at bedtime., Disp: 30 tablet, Rfl: 0   ezetimibe (ZETIA) 10 MG tablet, Take 10 mg by mouth at bedtime., Disp: , Rfl:    fluticasone (FLONASE) 50 MCG/ACT nasal spray, Place 1 spray into both nostrils daily., Disp: 1 g, Rfl: 0   gabapentin (NEURONTIN) 100 MG capsule, Take 2 capsules (200 mg total) by mouth at bedtime., Disp: 60 capsule, Rfl: 0   gabapentin (NEURONTIN) 300 MG  capsule, Take 300 mg by mouth at bedtime., Disp: , Rfl:    Glycerin, PF, (OPTASE COMFORT DRY EYE) 1 % SOLN, Place 1 drop into both eyes daily at 6 (six) AM., Disp: , Rfl:    hydrOXYzine (ATARAX) 25 MG tablet, Take 1 tablet (25 mg total) by mouth every 6 (six) hours as needed for anxiety., Disp: 30 tablet, Rfl: 0   latanoprost (XALATAN) 0.005 % ophthalmic solution, Place 1 drop into the left eye at bedtime., Disp: 2.5 mL, Rfl: 12   loratadine (CLARITIN) 10 MG tablet, Take 10 mg by mouth daily as needed for allergies. , Disp: , Rfl:    LUMIGAN 0.01 % SOLN, Place 1 drop into the right eye at bedtime., Disp: , Rfl:    meclizine (ANTIVERT) 25 MG tablet, Take 1 tablet (25 mg total) by mouth 3 (three) times daily as needed for dizziness., Disp: 30 tablet, Rfl: 0   meloxicam (MOBIC) 7.5 MG tablet, Take 7.5 mg by mouth daily., Disp: , Rfl:    mirtazapine (REMERON) 7.5 MG tablet, Take 1 tablet (7.5 mg total) by mouth at bedtime., Disp: 30 tablet, Rfl: 0   montelukast (SINGULAIR) 10 MG tablet, Take 10 mg by mouth daily as needed., Disp: , Rfl:    montelukast (SINGULAIR) 10 MG tablet, Take 1 tablet (10 mg total) by mouth daily., Disp: 30 tablet, Rfl: 0   Naphazoline-Pheniramine (ALLERGY EYE OP), Place 1-2 drops into both eyes 2 (two) times daily as needed (ITCHY, WATERY EYES)., Disp: , Rfl:    pantoprazole (PROTONIX) 40 MG tablet, Take 1 tablet (40 mg total) by mouth daily before breakfast., Disp: 15 tablet, Rfl: 0  Plecanatide (TRULANCE) 3 MG TABS, Take 1 tablet by mouth daily. (Patient taking differently: Take 1 tablet by mouth daily as needed.), Disp: 30 tablet, Rfl: 6   senna-docusate (SENOKOT-S) 8.6-50 MG tablet, Take 2 tablets by mouth 2 (two) times daily., Disp: 30 tablet, Rfl: 0   SIMBRINZA 1-0.2 % SUSP, Apply 1 drop to eye 2 (two) times daily., Disp: , Rfl:    SIMBRINZA 1-0.2 % SUSP, Place 1 drop into the left eye 2 (two) times daily., Disp: , Rfl:    sodium chloride (OCEAN) 0.65 % SOLN nasal spray,  Place 1 spray into both nostrils as needed for congestion., Disp: , Rfl:    traZODone (DESYREL) 50 MG tablet, Take 50 mg by mouth at bedtime., Disp: , Rfl:    traZODone (DESYREL) 50 MG tablet, Take 0.5 tablets (25 mg total) by mouth at bedtime as needed for sleep., Disp: 30 tablet, Rfl: 0  Vitals   Vitals:   03-Sep-2023 1210 09-03-23 1230 09-03-23 1233 2023-09-03 1236  BP: (!) 173/79 (!) 162/67    Pulse: 62   64  Resp:    12  Temp:   98.2 F (36.8 C)   TempSrc:   Oral   SpO2:    100%  Weight:        Body mass index is 20.07 kg/m.  Physical Exam   Constitutional: Thin appearing Caucasian female, well developed, in no acute distress Psych: Affect appropriate to situation, appears to have a slightly flat affect. Patient is calm throughout exam.  Eyes: No scleral injection.  HENT: No OP obstruction.  Head: Normocephalic.  Cardiovascular: Normal rate and regular rhythm on cardiac monitor Respiratory: Effort normal, non-labored breathing on room air GI: Soft.  No distension. There is no tenderness.  Skin: WDI.   Neurologic Examination   Mental Status: Patient is awake, alert, oriented to person, place, month, year, and situation. Patient is able to give a clear and coherent history. No signs of aphasia or neglect. Speech is fluent during conversation and history taking but with formal speech evaluation, the patient has a fluctuating intensity stutter with delayed responses.  Patient appears to be conscientious regarding degree of movement throughout assessment and minimally moves her mouth to speak or follow commands.  No dysarthria noted.  Cranial Nerves: II: Visual Fields are full. Pupils are equal, round, and reactive to light.   III,IV, VI: EOMI without ptosis or diploplia.  V: Decreased sensation to the left face to light touch reported, splits at midline on the forehead VII: Facial movement is symmetric resting and with movement with limited mouth movement on exam VIII: Hearing  is intact to voice X: Phonation intact XI: Shoulder shrug is symmetric. XII: Tongue protrudes midline  Motor: Tone is normal. Bulk is normal.  Strength exam requires coaching and encouragement for full participation.  The left upper extremity has a nonphysiologic drift on sustained elevation. The left lower extremity initially drifts but with encouragement, she is able to elevate the extremity antigravity without vertical drift.  Right upper and lower extremities elevate antigravity without vertical drift.  Sensory: Equal sensation to upper and lower extremities to light touch is reported without asymmetry.  Cerebellar: FNF and HKS are intact bilaterally. During FNF assessment there is fragmented slowed followed by quick movements that are precise and non-ataxic.   Labs/Imaging/Neurodiagnostic studies   CBC:  Recent Labs  Lab Sep 03, 2023 1205 09-03-23 1214  WBC 7.6  --   NEUTROABS 4.9  --   HGB 13.1 12.2  HCT 36.9 36.0  MCV 87.2  --   PLT 194  --    Basic Metabolic Panel:  Lab Results  Component Value Date   NA 140 08/31/2023   K 3.5 08/31/2023   CO2 24 08/31/2023   GLUCOSE 109 (H) 08/31/2023   BUN 15 08/31/2023   CREATININE 0.80 08/31/2023   CALCIUM 9.4 08/31/2023   GFRNONAA >60 08/31/2023   GFRAA >60 04/30/2020   Lipid Panel:  Lab Results  Component Value Date   LDLCALC 87 05/03/2023   HgbA1c:  Lab Results  Component Value Date   HGBA1C 5.9 (H) 05/03/2023   Urine Drug Screen:     Component Value Date/Time   LABOPIA NONE DETECTED 05/02/2023 1135   COCAINSCRNUR NONE DETECTED 05/02/2023 1135   LABBENZ NONE DETECTED 05/02/2023 1135   AMPHETMU NONE DETECTED 05/02/2023 1135   THCU NONE DETECTED 05/02/2023 1135   LABBARB NONE DETECTED 05/02/2023 1135    Alcohol Level     Component Value Date/Time   ETH <10 08/31/2023 1206   INR  Lab Results  Component Value Date   INR 1.0 08/31/2023   APTT  Lab Results  Component Value Date   APTT 30 08/31/2023    AED levels: No results found for: "PHENYTOIN", "ZONISAMIDE", "LAMOTRIGINE", "LEVETIRACETA"  CT Head without contrast(Personally reviewed): 1. Normal head CT. Small cavernoma at the right frontoparietalvertex shown by MRI is not visible. 2. Aspects is 10.  CT angio Head and Neck with contrast(Personally reviewed): CTA neck:  1. Streak/beam hardening artifact partially obscures the proximal right common carotid artery. Within this limitation, the common carotid and internal carotid arteries are patent within the neck without stenosis. Mild plaque within the left common carotid artery and about the left carotid bifurcation. 2. Vertebral arteries patent within the neck without stenosis or significant atherosclerotic disease. 3. Aortic Atherosclerosis (ICD10-I70.0).   CTA head: No proximal intracranial large vessel occlusion or proximal high-grade arterial stenosis identified.  ASSESSMENT   Julia Osborne is a 62 y.o. female with a PMHx of MDD, PTSD, anxiety, adenocarcinoma of the colon, endometrial cancer, conversion reaction, COPD, HLD, neuropathy 2/2 chemotherapy, and previous evaluations in August 2024 and December of 2015 with complaints of nonsensical speech, confusion, altered mental status with suspected functional overlays who presented to the ED 08/31/23 via EMS for complaints of a sudden popping sensation with left onset headache, left arm, face, and leg weakness and numbness in addition to a few days of dizziness and balance disturbance.  Patient states that she was up all night with urinary burning, frequency, and urgency and woke up this morning feeling dizzy and vomiting prior to the popping sensation and left-sided complaints acutely onset at 10:30 AM. Examination reveals nonphysiologic left arm weakness and stuttering speech that improves during conversation and worsens when asked speech-specific exam questions.  Her presentation is felt to be psychogenic in nature but will obtain  MRI brain with and without contrast with her given history to rule out stroke or metastasis etiologies for left-sided symptoms.   RECOMMENDATIONS  - MRI brain without contrast  - Headache cocktail  - If negative, no further neurology work up indicated at this time - If MRI brain is positive for acute ischemia, will expand stroke work up at that time - Evaluation of vomiting, nausea, and urinary complaints per EDP/primary team ______________________________________________________________________  Lanae Boast, AGACNP-BC [facility] Pager: [phone]  I have seen the patient was present for the entirety of the evaluation and management reflected in  the above note.  As described by Lanae Boast, she has giveaway weakness, nonorganic speech pattern, and a history of similar presentations making factitious or conversion disorder by far the most likely etiology here.  With her history, however, I do think it is reasonable to get an MRI to rule out underlying problems with superimposed embellishment.  If MRI is negative, I would not do any further neurological workup.  Ritta Slot, MD [facility] [phone]  If 7pm- 7am, please page neurology on call as listed in AMION.

## 2023-08-31 NOTE — ED Notes (Signed)
Pt given taxi voucher, approved by Okey Regal with social work

## 2023-08-31 NOTE — ED Notes (Signed)
..  The patient is A&OX4, ambulatory at d/c with independent steady gait, NAD. Pt verbalized understanding of d/c instructions and follow up care.

## 2023-08-31 NOTE — ED Triage Notes (Signed)
PT BIB EMS for stroke like symptoms, last known normal 10:00 am, per patient she started having slurred speech, and facial droop, was on the phone with her primary providers office and the nurse called 911. Pt is AOX4  18 g R AC

## 2023-08-31 NOTE — ED Provider Notes (Addendum)
[place] EMERGENCY DEPARTMENT AT [facility] Provider Note   CSN: [id number] Arrival date & time: 08/31/23  1202  An emergency department physician performed an initial assessment on this suspected stroke patient at 1202.  History  Chief Complaint  Patient presents with   Code Stroke    Julia Osborne is a 62 y.o. female.  This is a 62 year old female presenting emergency department as a code stroke.  Reportedly has had some balance disturbances for the past day or 2.  Awoke this morning with nausea and vomiting.  At 10 AM she heard a pop sound in the left and then began having some slurred speech, some left-sided numbness and left upper extremity weakness.  On my evaluation after CT scans which showed no acute pathology patient stating that now she is having some numbness to the right lower face.  Complains of dysuria for the past several days.  Still feels nauseated currently.        Home Medications Prior to Admission medications   Medication Sig Start Date End Date Taking? Authorizing Provider  albuterol (PROVENTIL HFA;VENTOLIN HFA) 108 (90 Base) MCG/ACT inhaler Inhale 2 puffs into the lungs every 2 (two) hours as needed for wheezing or shortness of breath (cough). 10/17/15  Yes Gelene Mink, NP  albuterol (PROVENTIL) (2.5 MG/3ML) 0.083% nebulizer solution Take 3 mLs (2.5 mg total) by nebulization every 6 (six) hours as needed for wheezing or shortness of breath. 01/16/16  Yes Penland, Novella Olive, MD  aspirin EC 81 MG tablet Take 1 tablet (81 mg total) by mouth daily. Swallow whole. 05/11/23  Yes Lewanda Rife, MD  atorvastatin (LIPITOR) 40 MG tablet Take 1 tablet (40 mg total) by mouth daily. 05/10/23 08/31/23 Yes Lewanda Rife, MD  busPIRone (BUSPAR) 5 MG tablet Take 1 tablet (5 mg total) by mouth 2 (two) times daily. Patient taking differently: Take 5 mg by mouth daily. 05/10/23  Yes Lewanda Rife, MD  escitalopram (LEXAPRO) 20 MG tablet Take 1 tablet (20  mg total) by mouth at bedtime. 05/10/23  Yes Lewanda Rife, MD  fluticasone (FLONASE) 50 MCG/ACT nasal spray Place 1 spray into both nostrils daily. 12/06/22 12/06/23 Yes Pilar Jarvis, MD  gabapentin (NEURONTIN) 100 MG capsule Take 2 capsules (200 mg total) by mouth at bedtime. 05/10/23  Yes Parmar, Daphine Deutscher, MD  Glycerin, PF, (OPTASE COMFORT DRY EYE) 1 % SOLN Place 1 drop into both eyes daily at 6 (six) AM.   Yes [provider]  hydrOXYzine (ATARAX) 25 MG tablet Take 1 tablet (25 mg total) by mouth every 6 (six) hours as needed for anxiety. 05/10/23  Yes Lewanda Rife, MD  latanoprost (XALATAN) 0.005 % ophthalmic solution Place 1 drop into the left eye at bedtime. 05/10/23  Yes Lewanda Rife, MD  loratadine (CLARITIN) 10 MG tablet Take 10 mg by mouth daily as needed for allergies.    Yes [provider]  LUMIGAN 0.01 % SOLN Place 1 drop into the right eye at bedtime. 06/24/18  Yes [provider]  mirtazapine (REMERON) 7.5 MG tablet Take 1 tablet (7.5 mg total) by mouth at bedtime. 05/10/23 08/31/23 Yes Lewanda Rife, MD  montelukast (SINGULAIR) 10 MG tablet Take 1 tablet (10 mg total) by mouth daily. 05/10/23  Yes Lewanda Rife, MD  Naphazoline-Pheniramine (ALLERGY EYE OP) Place 1-2 drops into both eyes 2 (two) times daily as needed (ITCHY, WATERY EYES).   Yes [provider]  SIMBRINZA 1-0.2 % SUSP Apply 1 drop to eye 2 (two)  times daily. 04/04/19  Yes [provider]  sodium chloride (OCEAN) 0.65 % SOLN nasal spray Place 1 spray into both nostrils as needed for congestion.   Yes [provider]  traZODone (DESYREL) 50 MG tablet Take 0.5 tablets (25 mg total) by mouth at bedtime as needed for sleep. 05/10/23  Yes Lewanda Rife, MD  meclizine (ANTIVERT) 25 MG tablet Take 1 tablet (25 mg total) by mouth 3 (three) times daily as needed for dizziness. Patient not taking: Reported on 08/31/2023 12/06/22   Pilar Jarvis, MD  pantoprazole  (PROTONIX) 40 MG tablet Take 1 tablet (40 mg total) by mouth daily before breakfast. Patient not taking: Reported on 08/31/2023 05/11/23   Lewanda Rife, MD  Plecanatide (TRULANCE) 3 MG TABS Take 1 tablet by mouth daily. Patient not taking: Reported on 08/31/2023 11/20/21   Wyline Mood, MD  senna-docusate (SENOKOT-S) 8.6-50 MG tablet Take 2 tablets by mouth 2 (two) times daily. Patient not taking: Reported on 08/31/2023 05/10/23   Lewanda Rife, MD      Allergies    Codeine and Codeine    Review of Systems   Review of Systems  Physical Exam Updated Vital Signs BP (!) 142/67   Pulse 70   Temp 98.2 F (36.8 C) (Oral)   Resp 15   Wt 51.4 kg   SpO2 99%   BMI 20.07 kg/m  Physical Exam Vitals and nursing note reviewed.  Constitutional:      General: She is not in acute distress.    Appearance: She is not toxic-appearing.  HENT:     Head: Normocephalic.     Nose: Nose normal.     Mouth/Throat:     Mouth: Mucous membranes are moist.  Eyes:     Conjunctiva/sclera: Conjunctivae normal.  Cardiovascular:     Rate and Rhythm: Normal rate and regular rhythm.  Pulmonary:     Effort: Pulmonary effort is normal.     Breath sounds: Normal breath sounds.  Abdominal:     General: Abdomen is flat. There is no distension.     Tenderness: There is no abdominal tenderness. There is no guarding or rebound.  Musculoskeletal:        General: Normal range of motion.  Skin:    General: Skin is warm and dry.     Capillary Refill: Capillary refill takes less than 2 seconds.  Neurological:     Mental Status: She is alert and oriented to person, place, and time.     Comments: Patient with some subjective paresthesias to bilateral lower face as well as to the left upper extremity.  She has good strength.  Coordinated movements.  Psychiatric:        Mood and Affect: Mood normal.        Behavior: Behavior normal.     ED Results / Procedures / Treatments   Labs (all labs ordered are  listed, but only abnormal results are displayed) Labs Reviewed  COMPREHENSIVE METABOLIC PANEL - Abnormal; Notable for the following components:      Result Value   Glucose, Bld 109 (*)    Total Protein 6.4 (*)    All other components within normal limits  URINALYSIS, ROUTINE W REFLEX MICROSCOPIC - Abnormal; Notable for the following components:   Specific Gravity, Urine >1.046 (*)    Hgb urine dipstick SMALL (*)    All other components within normal limits  I-STAT CHEM 8, ED - Abnormal; Notable for the following components:   Glucose, Bld 109 (*)  Calcium, Ion 1.12 (*)    All other components within normal limits  CBG MONITORING, ED - Abnormal; Notable for the following components:   Glucose-Capillary 104 (*)    All other components within normal limits  RESP PANEL BY RT-PCR (RSV, FLU A&B, COVID)  RVPGX2  PROTIME-INR  APTT  CBC  DIFFERENTIAL  ETHANOL    EKG None  Radiology MR BRAIN WO CONTRAST  Result Date: 08/31/2023 CLINICAL DATA:  Stroke, follow up EXAM: MRI HEAD WITHOUT CONTRAST TECHNIQUE: Multiplanar, multiecho pulse sequences of the brain and surrounding structures were obtained without intravenous contrast. COMPARISON:  Brain MR 12/06/2022, 05/02/23 FINDINGS: Brain: Negative for an acute infarct. No mass effect. No mass lesion. No extra-axial fluid collection. Unchanged small cavernoma in the right frontoparietal region. No hydrocephalus. There is a background of mild chronic microvascular ischemic change Vascular: Normal flow voids. Skull and upper cervical spine: Normal marrow signal. Sinuses/Orbits: Trace left mastoid effusion. No middle ear effusion. Paranasal sinuses are clear. Orbits are unremarkable. 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 IMPRESSION: No acute intracranial process. Electronically Signed   By: Lorenza Cambridge M.D.   On: 08/31/2023 14:34   CT ANGIO HEAD NECK W WO CM (CODE STROKE)  Result Date: 08/31/2023 CLINICAL DATA:  Provided  history: Neuro deficit, acute, stroke suspected. Additional history obtained from electronic MEDICAL RECORD NUMBERSlurred speech, facial droop. EXAM: CT ANGIOGRAPHY HEAD AND NECK WITH AND WITHOUT CONTRAST TECHNIQUE: Multidetector CT imaging of the head and neck was performed using the standard protocol during bolus administration of intravenous contrast. Multiplanar CT image reconstructions and MIPs were obtained to evaluate the vascular anatomy. Carotid stenosis measurements (when applicable) are obtained utilizing NASCET criteria, using the distal internal carotid diameter as the denominator. RADIATION DOSE REDUCTION: This exam was performed according to the departmental dose-optimization program which includes automated exposure control, adjustment of the mA and/or kV according to patient size and/or use of iterative reconstruction technique. CONTRAST:  75mL OMNIPAQUE IOHEXOL 350 MG/ML SOLN COMPARISON:  Head CT 08/31/2023.  CT angiogram head/neck 12/06/2022. FINDINGS: CTA NECK FINDINGS Aortic arch: Standard aortic branching. Atherosclerotic plaque within the visualized thoracic aorta. Streak/beam hardening artifact arising from a dense right-sided contrast bolus partially obscures the right subclavian artery. Within this limitation, there is no appreciable hemodynamically significant innominate or proximal subclavian artery stenosis. Right carotid system: Streak/beam hardening artifact arising from a dense right-sided contrast bolus partially obscures the proximal common carotid artery. Within this limitation, the common carotid and internal carotid arteries are patent within the neck without stenosis or significant atherosclerotic disease. Left carotid system: CCA and ICA patent within the neck without stenosis. Mild soft plaque within the CCA and above the carotid bifurcation. Vertebral arteries: Codominant and patent within the neck without stenosis or significant atherosclerotic disease. Skeleton: Cervical  spondylosis. No acute fracture or aggressive osseous lesion. Other neck: Subcentimeter nodules within the bilateral thyroid lobes not meeting consensus criteria for ultrasound follow-up based on size. No follow-up imaging recommended. Reference: J Am Coll Radiol. 2015 Feb;12(2): 143-50. Upper chest: No consolidation within the imaged lung apices. Review of the MIP images confirms the above findings CTA HEAD FINDINGS Anterior circulation: The intracranial internal carotid arteries are patent. The M1 middle cerebral arteries are patent. No M2 proximal branch occlusion or high-grade proximal stenosis. The anterior cerebral arteries are patent. No intracranial aneurysm is identified. Posterior circulation: The intracranial vertebral arteries are patent. The basilar artery is patent. The posterior cerebral arteries are patent. Fetal origin posterior cerebral  arteries bilaterally. Venous sinuses: Within the limitations of contrast timing, no convincing thrombus. Anatomic variants: As described. Review of the MIP images confirms the above findings No emergent large vessel occlusion identified. These results were communicated to Dr. Amada Jupiter at 12:42 pmon 12/10/2024by text page via the [facility] messaging system. IMPRESSION: CTA neck: 1. Streak/beam hardening artifact partially obscures the proximal right common carotid artery. Within this limitation, the common carotid and internal carotid arteries are patent within the neck without stenosis. Mild plaque within the left common carotid artery and about the left carotid bifurcation. 2. Vertebral arteries patent within the neck without stenosis or significant atherosclerotic disease. 3. Aortic Atherosclerosis (ICD10-I70.0). CTA head: No proximal intracranial large vessel occlusion or proximal high-grade arterial stenosis identified. Electronically Signed   By: Jackey Loge D.O.   On: 08/31/2023 12:44   CT HEAD CODE STROKE WO CONTRAST  Result Date: 08/31/2023 CLINICAL  DATA:  Code stroke.  Code stroke.  Left facial droop. EXAM: CT HEAD WITHOUT CONTRAST TECHNIQUE: Contiguous axial images were obtained from the base of the skull through the vertex without intravenous contrast. RADIATION DOSE REDUCTION: This exam was performed according to the departmental dose-optimization program which includes automated exposure control, adjustment of the mA and/or kV according to patient size and/or use of iterative reconstruction technique. COMPARISON:  05/02/2023 FINDINGS: Brain: Normal CT appearance of the brain. No evidence of old or acute infarction, mass lesion, hemorrhage, hydrocephalus or extra-axial collection. Small cavernoma at the right frontoparietal vertex shown by MRI is not visible. Vascular: No abnormal vascular finding. Skull: Normal Sinuses/Orbits: Clear/normal Other: None ASPECTS (Alberta Stroke Program Early CT Score) - Ganglionic level infarction (caudate, lentiform nuclei, internal capsule, insula, M1-M3 cortex): 7 - Supraganglionic infarction (M4-M6 cortex): 3 Total score (0-10 with 10 being normal): 10 IMPRESSION: 1. Normal head CT. Small cavernoma at the right frontoparietal vertex shown by MRI is not visible. 2. Aspects is 10. 3. These results were communicated to Dr. Amada Jupiter at 12:16 pm on 08/31/2023 by text page via the [facility] messaging system. Electronically Signed   By: Paulina Fusi M.D.   On: 08/31/2023 12:16    Procedures Procedures    Medications Ordered in ED Medications  sodium chloride flush (NS) 0.9 % injection 3 mL (3 mLs Intravenous Given 08/31/23 1239)  iohexol (OMNIPAQUE) 350 MG/ML injection 75 mL (75 mLs Intravenous Contrast Given 08/31/23 1218)  acetaminophen (TYLENOL) tablet 325 mg (325 mg Oral Given 08/31/23 1312)  ketorolac (TORADOL) 15 MG/ML injection 15 mg (15 mg Intravenous Given 08/31/23 1312)  prochlorperazine (COMPAZINE) injection 10 mg (10 mg Intravenous Given 08/31/23 1312)  sodium chloride 0.9 % bolus 1,000 mL (1,000 mLs  Intravenous New Bag/Given 08/31/23 1310)    ED Course/ Medical Decision Making/ A&P Clinical Course as of 08/31/23 1535  Tue Aug 31, 2023  1230 CT HEAD CODE STROKE WO CONTRAST IMPRESSION: 1. Normal head CT. Small cavernoma at the right frontoparietal vertex shown by MRI is not visible. 2. Aspects is 10. 3. These results were communicated to Dr. Amada Jupiter at 12:16 pm on 08/31/2023 by text page via the [facility] messaging system.   Electronically Signed   [TY]  1441 MR BRAIN WO CONTRAST IMPRESSION: No acute intracranial process.   [TY]  1510 Patient reports improvement of her numbness and neurosymptoms after migraine cocktail. [TY]  1523 Patient has ambulated the bathroom.  She is tolerating p.o.  UA negative for acute UTI.  CMP with no significant metabolic derangements.  Normal kidney function no transaminitis to suggest  hepatobiliary disease.  She has no leukocytosis fever or tachycardia to suggest systemic infection.  Flu/COVID/RSV negative.  Will discharge in stable condition.  She states that she has Zofran and Phenergan at home.  Offered Phenergan suppository.  Of note, neurology had recommended verbally MRI, but in note recommended MRI with and without contrast.  Patient's MRI today negative.  Reached back out to neurology who states that if patient's MRI without contrast is negative and symptoms improved there is no need to rescan her with contrast.  Stable for discharge at this time. [TY]    Clinical Course User Index [TY] Coral Spikes, DO                                 Medical Decision Making This is a 62 year old female present emergency department as a code stroke.  She is afebrile, nontachycardic.  Hypertensive.  Maintaining oxygen saturation on room air.  CT head CTA negative for acute pathology.  Per my discussion with Dr. Alene Mires team with neurology feel that this is likely migraine versus psychogenic.  Recommending MRI to exclude stroke as well as migraine  treatment.  Patient also complaining of nausea vomiting.  She has no leukocytosis to suggest systemic infection she has no metabolic derangements on her comprehensive panel.  She has no transaminitis to suggest hepatobiliary disease.  Soft nontender abdomen.  Alcohol level unremarkable.  Will get UA to evaluate for UTI.  Treat with migraine cocktail, Tylenol, Toradol, Compazine and a liter of IV fluids. Will also get viral panel given her complaint of N/V/D as these symptoms could be due to viral etiology.   See ED course for further MDM/Disposition.   Amount and/or Complexity of Data Reviewed Independent Historian:     Details: EMS; LNK 10:00am Labs: ordered. Radiology: ordered. Decision-making details documented in ED Course.  Risk OTC drugs. Prescription drug management.    Flu/COVID/RSV ordered to evaluate for possible viral etiology component of her presentation. negative     Final Clinical Impression(s) / ED Diagnoses Final diagnoses:  Other migraine without status migrainosus, not intractable  Viral syndrome    Rx / DC Orders ED Discharge Orders     None         Coral Spikes, DO 08/31/23 1535    Coral Spikes, DO 09/11/23 0006

## 2023-08-31 NOTE — Discharge Instructions (Addendum)
As discussed try to maintain adequate hydration with frequent sips of clears.  He may take over-the-counter Tylenol alternating with Motrin for pain.  Please return immediately if develop fevers, chills, chest pain, shortness of breath, worsening abdominal pain, inability to eat or drink due to nausea and vomiting or any new or worsening symptoms that are concerning to you.

## 2023-08-31 NOTE — Progress Notes (Signed)
CSW received call from RN stating patient needs assistance with transportation home. CSW informed RN to provide patient with a cab voucher. CSW notified Verdon Cummins [facility] supervisor of information.  Edwin Dada, MSW, LCSW Transitions of Care  Clinical Social Worker II [phone]

## 2023-09-07 ENCOUNTER — Ambulatory Visit: Payer: Medicare HMO | Admitting: Psychiatry

## 2023-09-07 DIAGNOSIS — R6889 Other general symptoms and signs: Secondary | ICD-10-CM | POA: Diagnosis not present

## 2023-09-08 ENCOUNTER — Inpatient Hospital Stay: Payer: Medicare HMO | Admitting: Internal Medicine

## 2023-09-08 ENCOUNTER — Inpatient Hospital Stay: Payer: Medicare HMO

## 2023-09-10 ENCOUNTER — Other Ambulatory Visit: Payer: Self-pay

## 2023-09-10 ENCOUNTER — Emergency Department (HOSPITAL_COMMUNITY): Payer: Medicare HMO

## 2023-09-10 ENCOUNTER — Emergency Department (HOSPITAL_COMMUNITY)
Admission: EM | Admit: 2023-09-10 | Discharge: 2023-09-11 | Disposition: A | Discharge status: Home / Self Care | Payer: Medicare HMO | Attending: Emergency Medicine | Admitting: Emergency Medicine

## 2023-09-10 DIAGNOSIS — R471 Dysarthria and anarthria: Secondary | ICD-10-CM | POA: Diagnosis not present

## 2023-09-10 DIAGNOSIS — R42 Dizziness and giddiness: Secondary | ICD-10-CM | POA: Insufficient documentation

## 2023-09-10 DIAGNOSIS — F329 Major depressive disorder, single episode, unspecified: Secondary | ICD-10-CM | POA: Diagnosis not present

## 2023-09-10 DIAGNOSIS — R45851 Suicidal ideations: Secondary | ICD-10-CM | POA: Diagnosis present

## 2023-09-10 DIAGNOSIS — R519 Headache, unspecified: Secondary | ICD-10-CM | POA: Insufficient documentation

## 2023-09-10 DIAGNOSIS — R7309 Other abnormal glucose: Secondary | ICD-10-CM | POA: Diagnosis not present

## 2023-09-10 DIAGNOSIS — I10 Essential (primary) hypertension: Secondary | ICD-10-CM | POA: Diagnosis not present

## 2023-09-10 DIAGNOSIS — R2990 Unspecified symptoms and signs involving the nervous system: Secondary | ICD-10-CM

## 2023-09-10 DIAGNOSIS — R4701 Aphasia: Secondary | ICD-10-CM | POA: Insufficient documentation

## 2023-09-10 DIAGNOSIS — R410 Disorientation, unspecified: Secondary | ICD-10-CM | POA: Diagnosis not present

## 2023-09-10 DIAGNOSIS — R269 Unspecified abnormalities of gait and mobility: Secondary | ICD-10-CM | POA: Insufficient documentation

## 2023-09-10 DIAGNOSIS — I700 Atherosclerosis of aorta: Secondary | ICD-10-CM | POA: Insufficient documentation

## 2023-09-10 DIAGNOSIS — F32A Depression, unspecified: Secondary | ICD-10-CM | POA: Diagnosis present

## 2023-09-10 DIAGNOSIS — R29810 Facial weakness: Secondary | ICD-10-CM | POA: Diagnosis not present

## 2023-09-10 DIAGNOSIS — R791 Abnormal coagulation profile: Secondary | ICD-10-CM | POA: Insufficient documentation

## 2023-09-10 DIAGNOSIS — R531 Weakness: Secondary | ICD-10-CM | POA: Diagnosis not present

## 2023-09-10 DIAGNOSIS — F447 Conversion disorder with mixed symptom presentation: Secondary | ICD-10-CM

## 2023-09-10 DIAGNOSIS — Z7982 Long term (current) use of aspirin: Secondary | ICD-10-CM | POA: Insufficient documentation

## 2023-09-10 DIAGNOSIS — R479 Unspecified speech disturbances: Secondary | ICD-10-CM | POA: Diagnosis not present

## 2023-09-10 DIAGNOSIS — R4781 Slurred speech: Secondary | ICD-10-CM | POA: Diagnosis not present

## 2023-09-10 DIAGNOSIS — R4182 Altered mental status, unspecified: Secondary | ICD-10-CM | POA: Insufficient documentation

## 2023-09-10 DIAGNOSIS — R404 Transient alteration of awareness: Secondary | ICD-10-CM | POA: Diagnosis not present

## 2023-09-10 LAB — I-STAT CHEM 8, ED
BUN: 10 mg/dL (ref 6–20)
Calcium, Ion: 1.11 mmol/L — ABNORMAL LOW (ref 1.15–1.40)
Chloride: 102 mmol/L (ref 98–111)
Creatinine, Ser: 0.9 mg/dL (ref 0.44–1.00)
Glucose, Bld: 95 mg/dL (ref 70–99)
HCT: 35 % — ABNORMAL LOW (ref 36.0–46.0)
Hemoglobin: 11.9 g/dL — ABNORMAL LOW (ref 12.0–15.0)
Potassium: 3.3 mmol/L — ABNORMAL LOW (ref 3.5–5.1)
Sodium: 140 mmol/L (ref 135–145)
TCO2: 26 mmol/L (ref 22–32)

## 2023-09-10 LAB — COMPREHENSIVE METABOLIC PANEL
ALT: 18 U/L (ref 0–44)
AST: 16 U/L (ref 15–41)
Albumin: 3.8 g/dL (ref 3.5–5.0)
Alkaline Phosphatase: 43 U/L (ref 38–126)
Anion gap: 7 (ref 5–15)
BUN: 9 mg/dL (ref 6–20)
CO2: 25 mmol/L (ref 22–32)
Calcium: 8.9 mg/dL (ref 8.9–10.3)
Chloride: 105 mmol/L (ref 98–111)
Creatinine, Ser: 0.88 mg/dL (ref 0.44–1.00)
GFR, Estimated: >60 mL/min (ref >60)
Glucose, Bld: 99 mg/dL (ref 70–99)
Potassium: 3.3 mmol/L — ABNORMAL LOW (ref 3.5–5.1)
Sodium: 137 mmol/L (ref 135–145)
Total Bilirubin: 0.7 mg/dL (ref <1.2)
Total Protein: 6.4 g/dL — ABNORMAL LOW (ref 6.5–8.1)

## 2023-09-10 LAB — DIFFERENTIAL
Abs Immature Granulocytes: 0.02 10*3/uL (ref 0.00–0.07)
Basophils Absolute: 0 10*3/uL (ref 0.0–0.1)
Basophils Relative: 0 %
Eosinophils Absolute: 0 10*3/uL (ref 0.0–0.5)
Eosinophils Relative: 0 %
Immature Granulocytes: 0 %
Lymphocytes Relative: 30 %
Lymphs Abs: 2.3 10*3/uL (ref 0.7–4.0)
Monocytes Absolute: 0.5 10*3/uL (ref 0.1–1.0)
Monocytes Relative: 6 %
Neutro Abs: 4.9 10*3/uL (ref 1.7–7.7)
Neutrophils Relative %: 64 %

## 2023-09-10 LAB — CBC
HCT: 36.3 % (ref 36.0–46.0)
Hemoglobin: 12.5 g/dL (ref 12.0–15.0)
MCH: 30.9 pg (ref 26.0–34.0)
MCHC: 34.4 g/dL (ref 30.0–36.0)
MCV: 89.9 fL (ref 80.0–100.0)
Platelets: 221 10*3/uL (ref 150–400)
RBC: 4.04 MIL/uL (ref 3.87–5.11)
RDW: 12.6 % (ref 11.5–15.5)
WBC: 7.8 10*3/uL (ref 4.0–10.5)
nRBC: 0 % (ref 0.0–0.2)

## 2023-09-10 LAB — APTT: aPTT: 28 s (ref 24–36)

## 2023-09-10 LAB — PROTIME-INR
INR: 1 (ref 0.8–1.2)
Prothrombin Time: 13.6 s (ref 11.4–15.2)

## 2023-09-10 LAB — CBG MONITORING, ED: Glucose-Capillary: 88 mg/dL (ref 70–99)

## 2023-09-10 LAB — ETHANOL: Alcohol, Ethyl (B): <10 mg/dL (ref <10)

## 2023-09-10 MED ORDER — DIPHENHYDRAMINE HCL 50 MG/ML IJ SOLN
12.5000 mg | Freq: Once | INTRAMUSCULAR | Status: AC
  Administered 2023-09-10: 12.5 mg via INTRAVENOUS
  Filled 2023-09-10: qty 1

## 2023-09-10 MED ORDER — SODIUM CHLORIDE 0.9% FLUSH
3.0000 mL | Freq: Once | INTRAVENOUS | Status: AC
  Administered 2023-09-10: 3 mL via INTRAVENOUS

## 2023-09-10 MED ORDER — ONDANSETRON 4 MG PO TBDP
4.0000 mg | ORAL_TABLET | Freq: Once | ORAL | Status: AC
  Administered 2023-09-10: 4 mg via ORAL
  Filled 2023-09-10: qty 1

## 2023-09-10 MED ORDER — GADOBUTROL 1 MMOL/ML IV SOLN
5.0000 mL | Freq: Once | INTRAVENOUS | Status: AC | PRN
  Administered 2023-09-10: 5 mL via INTRAVENOUS

## 2023-09-10 MED ORDER — IOHEXOL 350 MG/ML SOLN
100.0000 mL | Freq: Once | INTRAVENOUS | Status: AC | PRN
  Administered 2023-09-10: 100 mL via INTRAVENOUS

## 2023-09-10 MED ORDER — PROCHLORPERAZINE EDISYLATE 10 MG/2ML IJ SOLN
5.0000 mg | Freq: Once | INTRAMUSCULAR | Status: AC
  Administered 2023-09-10: 5 mg via INTRAVENOUS
  Filled 2023-09-10: qty 2

## 2023-09-10 MED ORDER — SODIUM CHLORIDE 0.9 % IV BOLUS
1000.0000 mL | Freq: Once | INTRAVENOUS | Status: AC
  Administered 2023-09-10: 1000 mL via INTRAVENOUS

## 2023-09-10 NOTE — ED Provider Notes (Signed)
[facility] AT [facility] Provider Note   CSN: [id number] Arrival date & time: 09/10/23  1222     History  Chief Complaint  Patient presents with   Code Stroke    AZLIN DUGAY is a 62 y.o. female.  Patient is a 62 year old female presenting for weakness.  Patient was found on her floor today by her son with left-sided upper and left lower extremity weakness.  At this time patient admits to difficulty speaking and a headache.  She denies any previous head trauma or injuries.  Denies any sensation deficits.  Denies facial asymmetry.  Last known well over 0600.  The history is provided by the patient. No language interpreter was used.       Home Medications Prior to Admission medications   Medication Sig Start Date End Date Taking? Authorizing Provider  albuterol (PROVENTIL HFA;VENTOLIN HFA) 108 (90 Base) MCG/ACT inhaler Inhale 2 puffs into the lungs every 2 (two) hours as needed for wheezing or shortness of breath (cough). 10/17/15   Gelene Mink, NP  albuterol (PROVENTIL) (2.5 MG/3ML) 0.083% nebulizer solution Take 3 mLs (2.5 mg total) by nebulization every 6 (six) hours as needed for wheezing or shortness of breath. 01/16/16   Penland, Novella Olive, MD  aspirin EC 81 MG tablet Take 1 tablet (81 mg total) by mouth daily. Swallow whole. 05/11/23   Lewanda Rife, MD  atorvastatin (LIPITOR) 40 MG tablet Take 1 tablet (40 mg total) by mouth daily. 05/10/23 08/31/23  Lewanda Rife, MD  busPIRone (BUSPAR) 5 MG tablet Take 1 tablet (5 mg total) by mouth 2 (two) times daily. Patient taking differently: Take 5 mg by mouth daily. 05/10/23   Lewanda Rife, MD  escitalopram (LEXAPRO) 20 MG tablet Take 1 tablet (20 mg total) by mouth at bedtime. 05/10/23   Lewanda Rife, MD  fluticasone (FLONASE) 50 MCG/ACT nasal spray Place 1 spray into both nostrils daily. 12/06/22 12/06/23  Pilar Jarvis, MD  gabapentin (NEURONTIN) 100 MG capsule Take 2 capsules (200 mg  total) by mouth at bedtime. 05/10/23   Lewanda Rife, MD  Glycerin, PF, (OPTASE COMFORT DRY EYE) 1 % SOLN Place 1 drop into both eyes daily at 6 (six) AM.    [provider]  hydrOXYzine (ATARAX) 25 MG tablet Take 1 tablet (25 mg total) by mouth every 6 (six) hours as needed for anxiety. 05/10/23   Lewanda Rife, MD  latanoprost (XALATAN) 0.005 % ophthalmic solution Place 1 drop into the left eye at bedtime. 05/10/23   Lewanda Rife, MD  loratadine (CLARITIN) 10 MG tablet Take 10 mg by mouth daily as needed for allergies.     [provider]  LUMIGAN 0.01 % SOLN Place 1 drop into the right eye at bedtime. 06/24/18   [provider]  meclizine (ANTIVERT) 25 MG tablet Take 1 tablet (25 mg total) by mouth 3 (three) times daily as needed for dizziness. Patient not taking: Reported on 08/31/2023 12/06/22   Pilar Jarvis, MD  mirtazapine (REMERON) 7.5 MG tablet Take 1 tablet (7.5 mg total) by mouth at bedtime. 05/10/23 08/31/23  Lewanda Rife, MD  montelukast (SINGULAIR) 10 MG tablet Take 1 tablet (10 mg total) by mouth daily. 05/10/23   Lewanda Rife, MD  Naphazoline-Pheniramine (ALLERGY EYE OP) Place 1-2 drops into both eyes 2 (two) times daily as needed (ITCHY, WATERY EYES).    [provider]  pantoprazole (PROTONIX) 40 MG tablet Take 1 tablet (40 mg total) by mouth daily before  breakfast. Patient not taking: Reported on 08/31/2023 05/11/23   Lewanda Rife, MD  Plecanatide (TRULANCE) 3 MG TABS Take 1 tablet by mouth daily. Patient not taking: Reported on 08/31/2023 11/20/21   Wyline Mood, MD  senna-docusate (SENOKOT-S) 8.6-50 MG tablet Take 2 tablets by mouth 2 (two) times daily. Patient not taking: Reported on 08/31/2023 05/10/23   Lewanda Rife, MD  [facility] 1-0.2 % SUSP Apply 1 drop to eye 2 (two) times daily. 04/04/19   [provider]  sodium chloride (OCEAN) 0.65 % SOLN nasal spray Place 1 spray into both nostrils as needed for  congestion.    [provider]  traZODone (DESYREL) 50 MG tablet Take 0.5 tablets (25 mg total) by mouth at bedtime as needed for sleep. 05/10/23   Lewanda Rife, MD      Allergies    Codeine and Codeine    Review of Systems   Review of Systems  Constitutional:  Negative for chills and fever.  HENT:  Negative for ear pain and sore throat.   Eyes:  Negative for pain and visual disturbance.  Respiratory:  Negative for cough and shortness of breath.   Cardiovascular:  Negative for chest pain and palpitations.  Gastrointestinal:  Negative for abdominal pain and vomiting.  Genitourinary:  Negative for dysuria and hematuria.  Musculoskeletal:  Negative for arthralgias and back pain.  Skin:  Negative for color change and rash.  Neurological:  Positive for facial asymmetry, speech difficulty, weakness and numbness. Negative for seizures and syncope.  All other systems reviewed and are negative.   Physical Exam Updated Vital Signs BP (!) 164/75 (BP Location: Left Arm)   Pulse 65   Temp 98.2 F (36.8 C) (Oral)   Resp 10   SpO2 100%  Physical Exam Vitals and nursing note reviewed.  Constitutional:      General: She is not in acute distress.    Appearance: She is well-developed.  HENT:     Head: Normocephalic and atraumatic.  Eyes:     Conjunctiva/sclera: Conjunctivae normal.  Cardiovascular:     Rate and Rhythm: Normal rate and regular rhythm.     Heart sounds: No murmur heard. Pulmonary:     Effort: Pulmonary effort is normal. No respiratory distress.     Breath sounds: Normal breath sounds.  Abdominal:     Palpations: Abdomen is soft.     Tenderness: There is no abdominal tenderness.  Musculoskeletal:        General: No swelling.     Cervical back: Neck supple.  Skin:    General: Skin is warm and dry.     Capillary Refill: Capillary refill takes less than 2 seconds.  Neurological:     Mental Status: She is alert and oriented to person, place, and time.      GCS: GCS eye subscore is 4. GCS verbal subscore is 5. GCS motor subscore is 6.     Cranial Nerves: Dysarthria present. No facial asymmetry.     Sensory: Sensation is intact.     Motor: Weakness present.  Psychiatric:        Mood and Affect: Mood normal.     ED Results / Procedures / Treatments   Labs (all labs ordered are listed, but only abnormal results are displayed) Labs Reviewed  COMPREHENSIVE METABOLIC PANEL - Abnormal; Notable for the following components:      Result Value   Potassium 3.3 (*)    Total Protein 6.4 (*)    All other components within  normal limits  I-STAT CHEM 8, ED - Abnormal; Notable for the following components:   Potassium 3.3 (*)    Calcium, Ion 1.11 (*)    Hemoglobin 11.9 (*)    HCT 35.0 (*)    All other components within normal limits  PROTIME-INR  APTT  CBC  DIFFERENTIAL  ETHANOL  CBG MONITORING, ED    EKG None  Radiology MR BRAIN W WO CONTRAST Result Date: 09/10/2023 CLINICAL DATA:  Mental status change, unknown cause EXAM: MRI HEAD WITHOUT AND WITH CONTRAST TECHNIQUE: Multiplanar, multiecho pulse sequences of the brain and surrounding structures were obtained without and with intravenous contrast. CONTRAST:  5mL GADAVIST GADOBUTROL 1 MMOL/ML IV SOLN COMPARISON:  MRI head 08/31/2023. FINDINGS: Brain: No acute infarction, acute hemorrhage, hydrocephalus, extra-axial collection or mass lesion. No pathologic enhancement. Small subcentimeter focus of T2 hypointensity in the high right frontal lobe with associated susceptibility artifact, compatible with prior microhemorrhage. Vascular: Major arterial flow voids are maintained at the skull base. Skull and upper cervical spine: Normal marrow signal. Sinuses/Orbits: Negative. IMPRESSION: No evidence of acute intracranial abnormality. Electronically Signed   By: Feliberto Harts M.D.   On: 09/10/2023 14:57   CT ANGIO HEAD NECK W WO CM W PERF (CODE STROKE) Result Date: 09/10/2023 CLINICAL DATA:   Difficulty speaking, abnormal gait, confusion EXAM: CT ANGIOGRAPHY HEAD AND NECK CT PERFUSION BRAIN TECHNIQUE: Multidetector CT imaging of the head and neck was performed using the standard protocol during bolus administration of intravenous contrast. Multiplanar CT image reconstructions and MIPs were obtained to evaluate the vascular anatomy. Carotid stenosis measurements (when applicable) are obtained utilizing NASCET criteria, using the distal internal carotid diameter as the denominator. Multiphase CT imaging of the brain was performed following IV bolus contrast injection. Subsequent parametric perfusion maps were calculated using RAPID software. RADIATION DOSE REDUCTION: This exam was performed according to the departmental dose-optimization program which includes automated exposure control, adjustment of the mA and/or kV according to patient size and/or use of iterative reconstruction technique. CONTRAST:  OMNIPAQUE IOHEXOL 350 MG/ML SOLN COMPARISON:  08/31/2023 CTA head and neck, 09/10/2023 CT head FINDINGS: CT HEAD FINDINGS For noncontrast findings, please see same day CT head. CTA NECK FINDINGS Aortic arch: Standard branching. Imaged portion shows no evidence of aneurysm or dissection. No significant stenosis of the major arch vessel origins. Minimal aortic atherosclerosis. Right carotid system: No evidence of dissection, occlusion, or hemodynamically significant stenosis (greater than 50%). Left carotid system: No evidence of dissection, occlusion, or hemodynamically significant stenosis (greater than 50%). Vertebral arteries: No evidence of dissection, occlusion, or hemodynamically significant stenosis (greater than 50%). Skeleton: No acute osseous abnormality. Degenerative changes in the cervical spine. Other neck: No acute finding. Upper chest: No focal pulmonary opacity or pleural effusion. Review of the MIP images confirms the above findings CTA HEAD FINDINGS Anterior circulation: Both internal  carotid arteries are patent to the termini, without significant stenosis. A1 segments patent. Normal anterior communicating artery. Anterior cerebral arteries are patent to their distal aspects without significant stenosis. No M1 stenosis or occlusion. MCA branches perfused to their distal aspects without significant stenosis. Posterior circulation: Vertebral arteries patent to the vertebrobasilar junction without significant stenosis. Posterior inferior cerebellar arteries patent proximally. Basilar patent to its distal aspect without significant stenosis. Superior cerebellar arteries patent proximally. Aplastic P1 segments. Fetal origin of bilateral PCAs from the posterior communicating arteries. PCAs perfused to their distal aspects without significant stenosis. Venous sinuses: As permitted by contrast timing, patent. Anatomic variants: Fetal origin of  the bilateral PCAs. No evidence of aneurysm or vascular malformation. Review of the MIP images confirms the above findings CT Brain Perfusion Findings: ASPECTS: 10 CBF (<30%) Volume: 0mL Perfusion (Tmax>6.0s) volume: 0mL Mismatch Volume: 0mL Infarction Location:None. IMPRESSION: 1. No intracranial large vessel occlusion or significant stenosis. 2. No hemodynamically significant stenosis in the neck. 3. No evidence of infarct core or ischemic penumbra on CT perfusion. 4. Aortic atherosclerosis. Aortic Atherosclerosis (ICD10-I70.0). Imaging results were communicated on 09/10/2023 at 1:02 pm to provider STACK via secure text paging. Electronically Signed   By: Wiliam Ke M.D.   On: 09/10/2023 13:03   CT HEAD CODE STROKE WO CONTRAST Result Date: 09/10/2023 CLINICAL DATA:  Code stroke.  Difficulty speaking.  Abnormal gait. EXAM: CT HEAD WITHOUT CONTRAST TECHNIQUE: Contiguous axial images were obtained from the base of the skull through the vertex without intravenous contrast. RADIATION DOSE REDUCTION: This exam was performed according to the departmental  dose-optimization program which includes automated exposure control, adjustment of the mA and/or kV according to patient size and/or use of iterative reconstruction technique. COMPARISON:  Brain MR 08/31/2023 FINDINGS: Brain: No hemorrhage. No hydrocephalus. No extra-axial fluid collection. No CT evidence of an acute cortical infarct. No mass effect. No mass lesion. Vascular: No hyperdense vessel or unexpected calcification. Skull: Normal. Negative for fracture or focal lesion. Sinuses/Orbits: No middle ear or mastoid effusion. Paranasal sinuses are clear. Orbits are unremarkable. Other: None. ASPECTS [facility] Stroke Program Early CT Score): 10 IMPRESSION: No hemorrhage or CT evidence of an acute cortical infarct. Findings were paged to Dr. Thomasenia Bottoms on 09/10/23 at 12:45 PM Electronically Signed   By: Lorenza Cambridge M.D.   On: 09/10/2023 12:46    Procedures .Critical Care  Performed by: Franne Forts, DO Authorized by: Franne Forts, DO   Critical care provider statement:    Critical care time (minutes):  76   Critical care was necessary to treat or prevent imminent or life-threatening deterioration of the following conditions: stroke like symtpoms.   Critical care was time spent personally by me on the following activities:  Development of treatment plan with patient or surrogate, discussions with consultants, evaluation of patient's response to treatment, examination of patient, ordering and review of laboratory studies, ordering and review of radiographic studies, ordering and performing treatments and interventions, pulse oximetry, re-evaluation of patient's condition and review of old charts   Care discussed with comment:  Neurology     Medications Ordered in ED Medications  sodium chloride flush (NS) 0.9 % injection 3 mL (has no administration in time range)  iohexol (OMNIPAQUE) 350 MG/ML injection 100 mL (100 mLs Intravenous Contrast Given 09/10/23 1245)  gadobutrol (GADAVIST) 1 MMOL/ML  injection 5 mL (5 mLs Intravenous Contrast Given 09/10/23 1424)    ED Course/ Medical Decision Making/ A&P                                 Medical Decision Making Amount and/or Complexity of Data Reviewed Labs: ordered. Radiology: ordered.   62 year old female presenting for weakness.  Patient is alert and oriented x 3, afebrile, stable vital signs.  Physical exam demonstrates symmetrical facial muscles.  Some dysphagia and slurred speech on exam.  Patient admits to left-sided weakness however on my physical exam she has symmetrical weakness in upper and lower extremities.  Concern for stroke.  Patient sent directly to CT.  Neurology already evaluated at the bridge.  CT head stable.  CTA head and neck stable. MRI pending.   MRI demonstrates no acute process.  I spoke with neurology who recommends psychiatry evaluation for multiple ED visits for strokelike symptoms with no clear medical etiology.  Patient medically cleared at this time.  TTS consult ordered.       Final Clinical Impression(s) / ED Diagnoses Final diagnoses:  Stroke-like symptoms    Rx / DC Orders ED Discharge Orders     None         Franne Forts, DO 09/10/23 1506

## 2023-09-10 NOTE — ED Triage Notes (Signed)
Patient from home via EMS after she was found on the floor by her son with L sided weakness, slurred speech, and aphasia. LKW 0600 today.

## 2023-09-10 NOTE — ED Provider Notes (Signed)
The patient was signed out to me after her medical workup here was unremarkable.  I was to follow-up on psychiatric recommendations on the patient after neurology recommended psychiatric evaluation due to the patient's multiple presentations with concern for possible conversion disorder.  Physical Exam  BP 104/69   Pulse 78   Temp 98.6 F (37 C) (Oral)   Resp 11   SpO2 100%   Physical Exam General: No acute distress Neuro: No focal deficits  Procedures  Procedures  ED Course / MDM    Medical Decision Making Amount and/or Complexity of Data Reviewed Labs: ordered. Radiology: ordered.  Risk Prescription drug management.   The patient was evaluated by psychiatry and there were no specific recommendations noted regarding the patient.  She was complaining of a headache and some dizziness on reassessment here.  She was given a migraine cocktail with improvement.  She is discharged with return precautions.       Durwin Glaze, MD 09/10/23 [phone]

## 2023-09-10 NOTE — Discharge Instructions (Signed)
Your workup today was reassuring.  There were no signs of stroke on your MRI.  Please drink plenty of fluids and take your time going from lying to sitting and sitting to standing.  I have placed a consult to cardiology to have you evaluated as an outpatient.  Return to the ER for worsening symptoms.

## 2023-09-10 NOTE — Code Documentation (Signed)
Stroke Response Nurse Documentation Code Documentation  Julia Osborne is a 62 y.o. female arriving to [facility]  via [facility] on 12/20 with past medical hx of Conversion reaction, TIA, COPD. On No antithrombotic. Code stroke was activated by EMS.   Patient from home where she was LKW at 0600 and was found on the floor this morning confused with L sided weakness.    Stroke team at the bedside on patient arrival. Labs drawn and patient cleared for CT by EDP. Patient to CT with team. NIHSS 5, see documentation for details and code stroke times. Patient with left facial droop, left leg weakness, Expressive aphasia , and dysarthria  on exam. The following imaging was completed:  CT Head and CTA. Patient is not a candidate for IV Thrombolytic due to being out of the treatment window. Patient is not a candidate for IR due to no LVO per MD.   Care Plan: q2 NIHSS and vitals.   Bedside handoff with ED RN Arline Asp.    Pearlie Oyster  Stroke Response RN

## 2023-09-10 NOTE — Consult Note (Signed)
[facility] Psychiatric Consult Initial  Patient Name: .Julia Osborne  MRN: [id number]  DOB: [birth year]  Consult Order details:  Orders (From admission, onward)   Admitted on 09/10/2023 for weakness. They have psychiatric diagnoses of depression and anxiety.  She has medical diagnoses of TIA, COPD, Asthma, GERD, endometrial cancer and adenocarcinoma of the colon.    Her current presentation is most consistent with major depressive disorder. She meets criteria for MDD based on depressed mood nearly everyday, significant weight loss, hypersomnia, fatigue, diminished ability to think, and psychomotor retardation.  Current outpatient psychotropic medications include buspar, lexapro, hydroxyzine and trazodone and historically she has had a minimal response to these medications. She was compliant with medications prior to admission as evidenced by patient report. On initial examination, patient is cooperative and engages in interview.  She complains of a headache saying "my head is hurting; burning like fire".  She says these headaches are new and started a few weeks ago.  She says she has been dizzy on and off for a long time, but it's been more frequent lately.  She says she has lost 15 pounds without trying.  She says she has been nauseous and throwing up.  She has a "nasty tasting foam that comes up when I try to eat or drink".  She says she was diagnosed with depression more than a year ago and that she takes her medications, but doesn't believe they help much.  She says her son lives with her.  He is the one that found her on the floor.  She states she has blacked out a few different times.  It has always been when she has changed planes too quickly (ie when she stands up or when bending over).  Before she blacks out, she will hear a ringing in her ears and have a "horrible pain in my head" and then "everything goes black".  Patient says she is sleeping a lot and that she is very fatigued.  She also says  she needs a calendar to remember important dates which bothers her; she didn't need a calendar before. She has no other concerns about her memory.  Patient is alert and oriented X 4. 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  Her highest level of education is 9th grade.  She tried to get her GED, but says she missed it by 2 points. She had difficulty with the math parts of the exam.  Patient is able to clearly recall life events, her medication regimen and knows her upcoming doctors appointments.  She says she has missed a couple lately due to feeling to unwell to drive.  The patient has a white film on her tongue that bothers her. She says she thinks it's thrush and has tried to brush it off her tongue without success.  Patient endorses a family history of a mother with borderline personality disorder, a daughter with bipolar disorder and her son has "heart issues".  Please see plan below for detailed recommendations.   Diagnoses:  Active Hospital problems: Principal Problem:   Depression    Plan   ## Psychiatric Medication Recommendations:  Continue Lexapro 20mg  PO Q HS Continue Hydroxyzine 25mg  PO Q 6 hours PRN anxiety Increase Gabapentin 200mg  PO BID  ## Medical Decision Making Capacity: Not specifically addressed in this encounter  ## Further Work-up:  U/A or UDS -- most recent EKG on  09/10/2023 had QtC of 454 -- Pertinent labwork reviewed earlier this admission includes: CBC, CMP, INR, PTT, Glucose, alcohol   ## Disposition:-- There are no psychiatric contraindications to discharge at this time  ## Behavioral / Environmental: - No specific recommendations at this time.     ## Safety and Observation Level:  - Based on my clinical evaluation, I estimate the patient to be at NO risk of self harm in the current setting. - At this time, we recommend  routine. This decision is based on my review of the  chart including patient's history and current presentation, interview of the patient, mental status examination, and consideration of suicide risk including evaluating suicidal ideation, plan, intent, suicidal or self-harm behaviors, risk factors, and protective factors. This judgment is based on our ability to directly address suicide risk, implement suicide prevention strategies, and develop a safety plan while the patient is in the clinical setting. Please contact our team if there is a concern that risk level has changed.  CSSR Risk Category:C-SSRS RISK CATEGORY: No Risk  Suicide Risk Assessment: Patient has following modifiable risk factors for suicide: chronic illness, which we are addressing by medically following closely. Patient has following non-modifiable or demographic risk factors for suicide: psychiatric hospitalization Patient has the following protective factors against suicide: Access to outpatient mental health care and Supportive family  Thank you for this consult request. Recommendations have been communicated to the primary team.  We will sign off at this time.   Thomes Lolling, NP       History of Present Illness  Relevant Aspects of Hospital ED Course:  Admitted on 09/10/2023 for weakness. They have psychiatric diagnoses of depression and anxiety.  She has medical diagnoses of TIA, COPD, Asthma, GERD, endometrial cancer and adenocarcinoma of the colon.   Patient Report:  On initial examination, patient is cooperative and engages in interview.  She complains of a headache saying "my head is hurting; burning like fire".  She says these headaches are new and started a few weeks ago.  She says she has been dizzy on and off for a long time, but it's been more frequent lately.  She says she has lost 15 pounds without trying.  She says she has been nauseous and throwing up.  She has a "nasty tasting foam that comes up when I try to eat or drink".  She says she was diagnosed with  depression more than a year ago and that she takes her medications, but doesn't believe they help much.  She says her son lives with her.  He is the one that found her on the floor.  She states she has blacked out a few different times.  It has always been when she has changed planes too quickly (ie when she stands up or when bending over).  Before she blacks out, she will hear a ringing in her ears and have a "horrible pain in my head" and then "everything goes black".  Patient says she is sleeping a lot and that she is very fatigued.  She also says she needs a calendar to remember important dates which bothers her; she didn't need a calendar before. She has no other concerns about her memory.  Patient is alert and oriented X 4. 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  Her highest level of education is 9th grade.  She tried to get  her GED, but says she missed it by 2 points. She had difficulty with the math parts of the exam.  Patient is able to clearly recall life events, her medication regimen and knows her upcoming doctors appointments.  She says she has missed a couple lately due to feeling to unwell to drive.  The patient has a white film on her tongue that bothers her. She says she thinks it's thrush and has tried to brush it off her tongue without success.  Patient endorses a family history of a mother with borderline personality disorder, a daughter with bipolar disorder and her son has "heart issues".   Psych ROS:  Depression: endorses Anxiety:  endorses Mania (lifetime and current): denies Psychosis: (lifetime and current): denies  Review of Systems  Constitutional:  Positive for weight loss.  HENT:         Patchy white tongue  Eyes:  Positive for photophobia.  Respiratory:  Positive for cough.   Neurological:  Positive for dizziness and headaches.  Psychiatric/Behavioral:  Positive for depression. The  patient is nervous/anxious.   All other systems reviewed and are negative.    Psychiatric and Social History  Psychiatric History:  Information collected from patient and chart review  Prev Dx/Sx: depression / anxiety Current Psych Provider: [facility], Claris Che was last noted in chart Home Meds (current): Lexapro 20mg  PO Q HS  Hydroxyzine 25mg  PO Q 6 hours PRN anxiety  Gabapentin 200mg  PO Q HS  Buspar 5mg  PO BID  Previous Med Trials: None noted Therapy: None noted  Prior Psych Hospitalization: yes Prior Self Harm: no Prior Violence: no  Family Psych History: mother with borderline personality disorder and daughter with bipolar disorder Family Hx suicide: None  Social History:   Educational Hx: completed 9th grade Occupational Hx: disability Living Situation: Lives with son Access to weapons/lethal means: denies   Substance History Alcohol: denies  Tobacco: endorses 1/2 pack per day Illicit drugs: denies Prescription drug abuse: denies Rehab hx: denies  Exam Findings  Physical Exam:  Vital Signs:  Temp:  [98.2 F (36.8 C)] 98.2 F (36.8 C) (12/20 1259) Pulse Rate:  [63-70] 63 (12/20 1645) Resp:  [10-19] 19 (12/20 1545) BP: (111-164)/(50-80) 139/71 (12/20 1645) SpO2:  [97 %-100 %] 97 % (12/20 1645) Blood pressure 139/71, pulse 63, temperature 98.2 F (36.8 C), temperature source Oral, resp. rate 19, SpO2 97%. There is no height or weight on file to calculate BMI.  Physical Exam Vitals and nursing note reviewed.  Eyes:     Pupils: Pupils are equal, round, and reactive to light.  Skin:    General: Skin is dry.  Neurological:     Mental Status: She is alert and oriented to person, place, and time.     Mental Status Exam: General Appearance: Disheveled  Orientation:  Full (Time, Place, and Person)  Memory:  Immediate;   Good Recent;   Good Remote;   Good  Concentration:  Concentration: Good  Recall:  Good  Attention  Good  Eye Contact:  Good   Speech:  Clear and Coherent  Language:  Good  Volume:  Normal  Mood: depressed  Affect:  Congruent  Thought Process:  Coherent  Thought Content:  WDL  Suicidal Thoughts:  No  Homicidal Thoughts:  No  Judgement:  Intact  Insight:  Good  Psychomotor Activity:  Decreased  Akathisia:  No  Fund of Knowledge:  Good      Assets:  Communication Skills Desire for Improvement Housing Social Support  Cognition:  WNL  ADL's:  Impaired  AIMS (if indicated):   n/a     Other History   These have been pulled in through the EMR, reviewed, and updated if appropriate.  Family History:  The patient's family history includes Brain cancer in her paternal aunt; Cancer in her father, paternal uncle, paternal uncle, and paternal uncle; Diabetes in her brother and sister; Endometriosis in her daughter; Heart attack in her father and maternal grandfather; Hypertension in her brother and sister; Kidney cancer (age of onset: 14) in her sister; Other in her mother; Stroke in her father.  Medical History: Past Medical History:  Diagnosis Date   Abdominal pain, acute, left upper quadrant    Abdominal pain, epigastric    Abnormal CT scan, sigmoid colon    Abnormal vaginal Pap smear    Adenocarcinoma of colon (HCC) 09/13/2015   Partial colon resection and chemo tx's.    Anemia    Anxiety    Anxiety disorder, unspecified    Aphasia    Asthma    Cancer (HCC)    endometrial; cancer cells in intestine   Chronic pain of left knee    Colon neoplasm    Constipation    Conversion reaction    COPD (chronic obstructive pulmonary disease) (HCC)    no definite diagnosis   Depression    Diverticulitis    Diverticulosis of colon without hemorrhage    Dyspareunia 05/16/2015   Dysphasia    Endometrial cancer (HCC)    Esophageal dysphagia    Family history of cancer    Family history of kidney cancer    Ganglion of left knee    Gastric intestinal metaplasia    GERD (gastroesophageal reflux disease)     Glaucoma    Hematochezia    History of colonic polyps    Hyperlipidemia    Indigestion    Lynch syndrome    Mucosal abnormality of stomach    Nausea and vomiting    Neuropathy    feet and hands   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 drug (HCC)    PONV (postoperative nausea and vomiting)    Pre-diabetes    Primary osteoarthritis of left knee    Rectal bleeding    Reflux esophagitis    Soft tissue swelling of knee joint    Uterine fibroid    UTI (urinary tract infection) 04/26/2023   on cipro   Vaginal dryness 05/16/2015   Vaginal itching    Vaginal Pap smear, abnormal     Surgical History: Past Surgical History:  Procedure Laterality Date   ABDOMINAL HYSTERECTOMY     APPENDECTOMY     BIOPSY N/A 03/14/2015   Procedure: BIOPSY;  Surgeon: Corbin Ade, MD;  Location: [facility];  Service: Endoscopy;  Laterality: N/A;  Gastric   COLONOSCOPY N/A 01/28/2017   Procedure: COLONOSCOPY;  Surgeon: Corbin Ade, MD;  Location: [facility];  Service: Endoscopy;  Laterality: N/A;  11:30am   COLONOSCOPY WITH PROPOFOL N/A 03/14/2015   RMR: Internal hemorrhoids. colonic diverticulosis. Incomplete examination. Prepartation inadequate.   COLONOSCOPY WITH PROPOFOL N/A 07/04/2015   RMR: Colonic diverticulosis . Large polypoid lesion in the vicinity of the hepatic flexure status post saline-assisted piecmeal snare polypectomy  with ablation and tattooing as described. Sigmoid polyp removed as described above. sigmoid colon polyp hyperplastic, hepatic flexure polyp with TA with focal high grade dysplasia    COLONOSCOPY WITH PROPOFOL N/A 11/03/2018   Procedure: COLONOSCOPY WITH PROPOFOL;  Surgeon: Wyline Mood, MD;  Location: [facility] ENDOSCOPY;  Service: Gastroenterology;  Laterality: N/A;   COLONOSCOPY WITH PROPOFOL N/A 12/09/2020   Procedure: COLONOSCOPY WITH PROPOFOL;  Surgeon: Wyline Mood, MD;  Location: [facility] ENDOSCOPY;  Service: Gastroenterology;  Laterality: N/A;  COVID POSITIVE 10/30/2020    COLONOSCOPY WITH PROPOFOL N/A 01/20/2021   Procedure: COLONOSCOPY WITH PROPOFOL;  Surgeon: Wyline Mood, MD;  Location: [facility] ENDOSCOPY;  Service: Gastroenterology;  Laterality: N/A;   COLONOSCOPY WITH PROPOFOL N/A 12/10/2021   Procedure: COLONOSCOPY WITH PROPOFOL;  Surgeon: Wyline Mood, MD;  Location: [facility] ENDOSCOPY;  Service: Gastroenterology;  Laterality: N/A;   COLONOSCOPY WITH PROPOFOL N/A 02/16/2023   Procedure: COLONOSCOPY WITH PROPOFOL;  Surgeon: Wyline Mood, MD;  Location: [facility] ENDOSCOPY;  Service: Gastroenterology;  Laterality: N/A;   ENTEROSCOPY N/A 02/12/2020   Procedure: ENTEROSCOPY;  Surgeon: Wyline Mood, MD;  Location: [facility] ENDOSCOPY;  Service: Gastroenterology;  Laterality: N/A;   ESOPHAGEAL DILATION N/A 03/14/2015   Procedure: ESOPHAGEAL DILATION;  Surgeon: Corbin Ade, MD;  Location: [facility];  Service: Endoscopy;  Laterality: N/AElease Hashimoto 54   ESOPHAGOGASTRODUODENOSCOPY N/A 05/19/2016   Procedure: ESOPHAGOGASTRODUODENOSCOPY (EGD);  Surgeon: Corbin Ade, MD;  [facility]: [facility];  Service: Endoscopy;  Laterality: N/A;  215   ESOPHAGOGASTRODUODENOSCOPY N/A 01/28/2017   Procedure: ESOPHAGOGASTRODUODENOSCOPY (EGD);  Surgeon: Corbin Ade, MD;  [facility]: [facility];  Service: Endoscopy;  Laterality: N/A;   ESOPHAGOGASTRODUODENOSCOPY N/A 12/10/2021   Procedure: ESOPHAGOGASTRODUODENOSCOPY (EGD);  Surgeon: Wyline Mood, MD;  Location: [facility] ENDOSCOPY;  Service: Gastroenterology;  Laterality: N/A;   ESOPHAGOGASTRODUODENOSCOPY (EGD) WITH PROPOFOL N/A 03/14/2015   RMR: Mild erosive reflux esophagitis status post passage o f a Maloney dilator. Abnormal gastric mucosa of uncertain significance as described above. status post biopsy, benign   ESOPHAGOGASTRODUODENOSCOPY (EGD) WITH PROPOFOL N/A 11/18/2017   Procedure: ESOPHAGOGASTRODUODENOSCOPY (EGD) WITH PROPOFOL;  Surgeon: Corbin Ade, MD;  [facility];  Service: Endoscopy;  Laterality: N/A;  12:15pm    ESOPHAGOGASTRODUODENOSCOPY (EGD) WITH PROPOFOL N/A 11/03/2018   Procedure: ESOPHAGOGASTRODUODENOSCOPY (EGD) WITH PROPOFOL;  Surgeon: Wyline Mood, MD;  Location: [facility] ENDOSCOPY;  Service: Gastroenterology;  Laterality: N/A;   ESOPHAGOGASTRODUODENOSCOPY (EGD) WITH PROPOFOL N/A 02/16/2023   Procedure: ESOPHAGOGASTRODUODENOSCOPY (EGD) WITH PROPOFOL;  Surgeon: Wyline Mood, MD;  Location: [facility] ENDOSCOPY;  Service: Gastroenterology;  Laterality: N/A;   GIVENS CAPSULE STUDY N/A 12/25/2019   Procedure: GIVENS CAPSULE STUDY;  Surgeon: Wyline Mood, MD;  Location: [facility] ENDOSCOPY;  Service: Gastroenterology;  Laterality: N/A;   MALONEY DILATION N/A 05/19/2016   Procedure: [name] DILATION;  Surgeon: Corbin Ade, MD;  [facility];  Service: Endoscopy;  Laterality: N/A;   MALONEY DILATION N/A 01/28/2017   Procedure: [name] DILATION;  Surgeon: Corbin Ade, MD;  Location: [facility];  Service: Endoscopy;  Laterality: N/A;   MALONEY DILATION N/A 11/18/2017   Procedure: [name] DILATION;  Surgeon: Corbin Ade, MD;  Location: [facility];  Service: Endoscopy;  Laterality: N/A;   PARTIAL COLECTOMY  08/30/2015   polyp with adenocarcinoma   PARTIAL COLECTOMY  08/29/2018   POLYPECTOMY N/A 07/04/2015   Procedure: POLYPECTOMY;  Surgeon: Corbin Ade, MD;  Location: [facility];  Service: Endoscopy;  Laterality: N/A;   PORTACATH PLACEMENT Right 09/21/2014   TUBAL LIGATION       Medications:  No current facility-administered medications for this encounter.  Current Outpatient Medications:    albuterol (PROVENTIL HFA;VENTOLIN HFA) 108 (90 Base) MCG/ACT inhaler, Inhale 2 puffs into the lungs every 2 (two) hours as needed for  wheezing or shortness of breath (cough)., Disp: 1 Inhaler, Rfl: 3   albuterol (PROVENTIL) (2.5 MG/3ML) 0.083% nebulizer solution, Take 3 mLs (2.5 mg total) by nebulization every 6 (six) hours as needed for wheezing or shortness of breath., Disp: 75 mL, Rfl: 12   aspirin  EC 81 MG tablet, Take 1 tablet (81 mg total) by mouth daily. Swallow whole., Disp: 30 tablet, Rfl: 12   atorvastatin (LIPITOR) 40 MG tablet, Take 1 tablet (40 mg total) by mouth daily., Disp: 30 tablet, Rfl: 0   busPIRone (BUSPAR) 5 MG tablet, Take 1 tablet (5 mg total) by mouth 2 (two) times daily. (Patient taking differently: Take 5 mg by mouth daily.), Disp: 60 tablet, Rfl: 0   escitalopram (LEXAPRO) 20 MG tablet, Take 1 tablet (20 mg total) by mouth at bedtime., Disp: 30 tablet, Rfl: 0   fluticasone (FLONASE) 50 MCG/ACT nasal spray, Place 1 spray into both nostrils daily., Disp: 1 g, Rfl: 0   gabapentin (NEURONTIN) 100 MG capsule, Take 2 capsules (200 mg total) by mouth at bedtime., Disp: 60 capsule, Rfl: 0   Glycerin, PF, (OPTASE COMFORT DRY EYE) 1 % SOLN, Place 1 drop into both eyes daily at 6 (six) AM., Disp: , Rfl:    hydrOXYzine (ATARAX) 25 MG tablet, Take 1 tablet (25 mg total) by mouth every 6 (six) hours as needed for anxiety., Disp: 30 tablet, Rfl: 0   latanoprost (XALATAN) 0.005 % ophthalmic solution, Place 1 drop into the left eye at bedtime., Disp: 2.5 mL, Rfl: 12   loratadine (CLARITIN) 10 MG tablet, Take 10 mg by mouth daily as needed for allergies. , Disp: , Rfl:    LUMIGAN 0.01 % SOLN, Place 1 drop into the right eye at bedtime., Disp: , Rfl:    meclizine (ANTIVERT) 25 MG tablet, Take 1 tablet (25 mg total) by mouth 3 (three) times daily as needed for dizziness. (Patient not taking: Reported on 08/31/2023), Disp: 30 tablet, Rfl: 0   mirtazapine (REMERON) 7.5 MG tablet, Take 1 tablet (7.5 mg total) by mouth at bedtime., Disp: 30 tablet, Rfl: 0   montelukast (SINGULAIR) 10 MG tablet, Take 1 tablet (10 mg total) by mouth daily., Disp: 30 tablet, Rfl: 0   Naphazoline-Pheniramine (ALLERGY EYE OP), Place 1-2 drops into both eyes 2 (two) times daily as needed (ITCHY, WATERY EYES)., Disp: , Rfl:    pantoprazole (PROTONIX) 40 MG tablet, Take 1 tablet (40 mg total) by mouth daily before  breakfast. (Patient not taking: Reported on 08/31/2023), Disp: 15 tablet, Rfl: 0   Plecanatide (TRULANCE) 3 MG TABS, Take 1 tablet by mouth daily. (Patient not taking: Reported on 08/31/2023), Disp: 30 tablet, Rfl: 6   senna-docusate (SENOKOT-S) 8.6-50 MG tablet, Take 2 tablets by mouth 2 (two) times daily. (Patient not taking: Reported on 08/31/2023), Disp: 30 tablet, Rfl: 0   SIMBRINZA 1-0.2 % SUSP, Apply 1 drop to eye 2 (two) times daily., Disp: , Rfl:    sodium chloride (OCEAN) 0.65 % SOLN nasal spray, Place 1 spray into both nostrils as needed for congestion., Disp: , Rfl:    traZODone (DESYREL) 50 MG tablet, Take 0.5 tablets (25 mg total) by mouth at bedtime as needed for sleep., Disp: 30 tablet, Rfl: 0  Allergies: Allergies  Allergen Reactions   Codeine Rash   Codeine Nausea And Vomiting and Rash    Thomes Lolling, NP

## 2023-09-10 NOTE — Consult Note (Signed)
NEUROLOGY CONSULT NOTE   Date of service: September 10, 2023 Patient Name: Julia Osborne MRN:  [id number] DOB:  August 23, 1961 Chief Complaint: "Code stroke" Requesting Provider: Franne Forts, DO  History of Present Illness  ABHA RACANELLI is a 62 y.o. female with past medical history significant for conversion reaction, anxiety/depression,colon and endometrial cancer, hyperlipidemia, prediabetes who was brought in by EMS as a code stroke due to aphasia, left hemiparesis, abnormal gait.  Patient was reportedly found this morning by her son confused with left-sided weakness. On exam at bridge, patient had  left facial droop, bilateral leg weakness,expressive aphasia, dysarthria.  Her symptoms did seem to improve some throughout CT scan and subsequent evaluations.  NIH was 5.CT/CTA negative. Patient denies headache, SOB, dizziness, n/v.  Patient is on aspirin 81 mg at home.  EMS noted a history of a stroke 3 months ago but I do not see this on chart review. Patient has had multiple presentations presenting with strokelike symptoms dating back to 2015.  During this time evaluation was felt to be significant functional overlay.  August 2024 she presented as a code stroke for evaluation of altered mental status, confusion, nonsensical speech.  Workup at this time was completed and negative.  LKW: 0600 12/20 Modified rankin score: 1-No significant post stroke disability and can perform usual duties with stroke symptoms IV Thrombolysis: No, outside of window EVT: No LVO  NIHSS components Score: Comment  1a Level of Conscious 0[]  1[]  2[]  3[]      1b LOC Questions 0[]  1[]  2[]       1c LOC Commands 0[]  1[]  2[]       2 Best Gaze 0[]  1[]  2[]       3 Visual 0[]  1[]  2[]  3[]      4 Facial Palsy 0[]  1[x]  2[]  3[]      5a Motor Arm - left 0[]  1[]  2[]  3[]  4[]  UN[]    5b Motor Arm - Right 0[]  1[]  2[]  3[]  4[]  UN[]    6a Motor Leg - Left 0[]  1[]  2[x]  3[]  4[]  UN[]    6b Motor Leg - Right 0[]  1[]  2[]  3[]  4[]  UN[]    7 Limb  Ataxia 0[]  1[]  2[]  3[]  UN[]     8 Sensory 0[]  1[]  2[]  UN[]      9 Best Language 0[]  1[x]  2[]  3[]      10 Dysarthria 0[]  1[x]  2[]  UN[]      11 Extinct. and Inattention 0[]  1[]  2[]       TOTAL:   5      ROS  Comprehensive ROS performed and pertinent positives documented in HPI    Past History   Past Medical History:  Diagnosis Date   Abdominal pain, acute, left upper quadrant    Abdominal pain, epigastric    Abnormal CT scan, sigmoid colon    Abnormal vaginal Pap smear    Adenocarcinoma of colon (HCC) 09/13/2015   Partial colon resection and chemo tx's.    Anemia    Anxiety    Anxiety disorder, unspecified    Aphasia    Asthma    Cancer (HCC)    endometrial; cancer cells in intestine   Chronic pain of left knee    Colon neoplasm    Constipation    Conversion reaction    COPD (chronic obstructive pulmonary disease) (HCC)    no definite diagnosis   Depression    Diverticulitis    Diverticulosis of colon without hemorrhage    Dyspareunia 05/16/2015   Dysphasia    Endometrial cancer (HCC)  Esophageal dysphagia    Family history of cancer    Family history of kidney cancer    Ganglion of left knee    Gastric intestinal metaplasia    GERD (gastroesophageal reflux disease)    Glaucoma    Hematochezia    History of colonic polyps    Hyperlipidemia    Indigestion    Lynch syndrome    Mucosal abnormality of stomach    Nausea and vomiting    Neuropathy    feet and hands   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 drug (HCC)    PONV (postoperative nausea and vomiting)    Pre-diabetes    Primary osteoarthritis of left knee    Rectal bleeding    Reflux esophagitis    Soft tissue swelling of knee joint    Uterine fibroid    UTI (urinary tract infection) 04/26/2023   on cipro   Vaginal dryness 05/16/2015   Vaginal itching    Vaginal Pap smear, abnormal     Past Surgical History:  Procedure Laterality Date   ABDOMINAL HYSTERECTOMY     APPENDECTOMY     BIOPSY N/A  03/14/2015   Procedure: BIOPSY;  Surgeon: Corbin Ade, MD;  Location: [facility];  Service: Endoscopy;  Laterality: N/A;  Gastric   COLONOSCOPY N/A 01/28/2017   Procedure: COLONOSCOPY;  Surgeon: Corbin Ade, MD;  Location: [facility];  Service: Endoscopy;  Laterality: N/A;  11:30am   COLONOSCOPY WITH PROPOFOL N/A 03/14/2015   RMR: Internal hemorrhoids. colonic diverticulosis. Incomplete examination. Prepartation inadequate.   COLONOSCOPY WITH PROPOFOL N/A 07/04/2015   RMR: Colonic diverticulosis . Large polypoid lesion in the vicinity of the hepatic flexure status post saline-assisted piecmeal snare polypectomy  with ablation and tattooing as described. Sigmoid polyp removed as described above. sigmoid colon polyp hyperplastic, hepatic flexure polyp with TA with focal high grade dysplasia    COLONOSCOPY WITH PROPOFOL N/A 11/03/2018   Procedure: COLONOSCOPY WITH PROPOFOL;  Surgeon: Wyline Mood, MD;  Location[facility] ENDOSCOPY;  Service: Gastroenterology;  Laterality: N/A;   COLONOSCOPY WITH PROPOFOL N/A 12/09/2020   Procedure: COLONOSCOPY WITH PROPOFOL;  Surgeon: Wyline Mood, MD;  Location: [facility] ENDOSCOPY;  Service: Gastroenterology;  Laterality: N/A;  COVID POSITIVE 10/30/2020   COLONOSCOPY WITH PROPOFOL N/A 01/20/2021   Procedure: COLONOSCOPY WITH PROPOFOL;  Surgeon: Wyline Mood, MD;  Location: [facility] ENDOSCOPY;  Service: Gastroenterology;  Laterality: N/A;   COLONOSCOPY WITH PROPOFOL N/A 12/10/2021   Procedure: COLONOSCOPY WITH PROPOFOL;  Surgeon: Wyline Mood, MD;  Location: [facility] ENDOSCOPY;  Service: Gastroenterology;  Laterality: N/A;   COLONOSCOPY WITH PROPOFOL N/A 02/16/2023   Procedure: COLONOSCOPY WITH PROPOFOL;  Surgeon: Wyline Mood, MD;  Location: [facility] ENDOSCOPY;  Service: Gastroenterology;  Laterality: N/A;   ENTEROSCOPY N/A 02/12/2020   Procedure: ENTEROSCOPY;  Surgeon: Wyline Mood, MD;  Location: [facility] ENDOSCOPY;  Service: Gastroenterology;  Laterality: N/A;   ESOPHAGEAL DILATION N/A  03/14/2015   Procedure: ESOPHAGEAL DILATION;  Surgeon: Corbin Ade, MD;  Location: [facility];  Service: Endoscopy;  Laterality: N/AElease Hashimoto 54   ESOPHAGOGASTRODUODENOSCOPY N/A 05/19/2016   Procedure: ESOPHAGOGASTRODUODENOSCOPY (EGD);  Surgeon: Corbin Ade, MD;  [facility]: [facility];  Service: Endoscopy;  Laterality: N/A;  215   ESOPHAGOGASTRODUODENOSCOPY N/A 01/28/2017   Procedure: ESOPHAGOGASTRODUODENOSCOPY (EGD);  Surgeon: Corbin Ade, MD;  [facility]: [facility];  Service: Endoscopy;  Laterality: N/A;   ESOPHAGOGASTRODUODENOSCOPY N/A 12/10/2021   Procedure: ESOPHAGOGASTRODUODENOSCOPY (EGD);  Surgeon: Wyline Mood, MD;  Location: [facility] ENDOSCOPY;  Service: Gastroenterology;  Laterality:  N/A;   ESOPHAGOGASTRODUODENOSCOPY (EGD) WITH PROPOFOL N/A 03/14/2015   RMR: Mild erosive reflux esophagitis status post passage o f a Maloney dilator. Abnormal gastric mucosa of uncertain significance as described above. status post biopsy, benign   ESOPHAGOGASTRODUODENOSCOPY (EGD) WITH PROPOFOL N/A 11/18/2017   Procedure: ESOPHAGOGASTRODUODENOSCOPY (EGD) WITH PROPOFOL;  Surgeon: Corbin Ade, MD;  [facility];  Service: Endoscopy;  Laterality: N/A;  12:15pm   ESOPHAGOGASTRODUODENOSCOPY (EGD) WITH PROPOFOL N/A 11/03/2018   Procedure: ESOPHAGOGASTRODUODENOSCOPY (EGD) WITH PROPOFOL;  Surgeon: Wyline Mood, MD;  Location: [facility] ENDOSCOPY;  Service: Gastroenterology;  Laterality: N/A;   ESOPHAGOGASTRODUODENOSCOPY (EGD) WITH PROPOFOL N/A 02/16/2023   Procedure: ESOPHAGOGASTRODUODENOSCOPY (EGD) WITH PROPOFOL;  Surgeon: Wyline Mood, MD;  Location: [facility] ENDOSCOPY;  Service: Gastroenterology;  Laterality: N/A;   GIVENS CAPSULE STUDY N/A 12/25/2019   Procedure: GIVENS CAPSULE STUDY;  Surgeon: Wyline Mood, MD;  Location: [facility] ENDOSCOPY;  Service: Gastroenterology;  Laterality: N/A;   MALONEY DILATION N/A 05/19/2016   Procedure: [name] DILATION;  Surgeon: Corbin Ade, MD;  Location[facility];   Service: Endoscopy;  Laterality: N/A;   MALONEY DILATION N/A 01/28/2017   Procedure: [name] DILATION;  Surgeon: Corbin Ade, MD;  Location: [facility];  Service: Endoscopy;  Laterality: N/A;   MALONEY DILATION N/A 11/18/2017   Procedure: [name] DILATION;  Surgeon: Corbin Ade, MD;  Location: [facility];  Service: Endoscopy;  Laterality: N/A;   PARTIAL COLECTOMY  08/30/2015   polyp with adenocarcinoma   PARTIAL COLECTOMY  08/29/2018   POLYPECTOMY N/A 07/04/2015   Procedure: POLYPECTOMY;  Surgeon: Corbin Ade, MD;  Location: [facility];  Service: Endoscopy;  Laterality: N/A;   PORTACATH PLACEMENT Right 09/21/2014   TUBAL LIGATION      Family History: Family History  Problem Relation Age of Onset   Other Mother        clot that went to heart, deceased age 81ss   Heart attack Father        age 19s, deceased   Stroke Father    Cancer Father        "at death determined he was ate up with cancer"   Hypertension Sister    Kidney cancer Sister 50   Diabetes Brother    Hypertension Brother    Endometriosis Daughter    Heart attack Maternal Grandfather    Diabetes Sister    Brain cancer Paternal Aunt    Cancer Paternal Uncle        NOS   Cancer Paternal Uncle        NOS   Cancer Paternal Uncle        NOS   Colon cancer Neg Hx     Social History  reports that she has never smoked. She has never used smokeless tobacco. She reports that she does not drink alcohol and does not use drugs.  Allergies  Allergen Reactions   Codeine Rash   Codeine Nausea And Vomiting and Rash    Medications   Current Facility-Administered Medications:    sodium chloride flush (NS) 0.9 % injection 3 mL, 3 mL, Intravenous, Once, Franne Forts, DO  Current Outpatient Medications:    albuterol (PROVENTIL HFA;VENTOLIN HFA) 108 (90 Base) MCG/ACT inhaler, Inhale 2 puffs into the lungs every 2 (two) hours as needed for wheezing or shortness of breath (cough)., Disp: 1 Inhaler, Rfl: 3    albuterol (PROVENTIL) (2.5 MG/3ML) 0.083% nebulizer solution, Take 3 mLs (2.5 mg total) by nebulization every 6 (six) hours  as needed for wheezing or shortness of breath., Disp: 75 mL, Rfl: 12   aspirin EC 81 MG tablet, Take 1 tablet (81 mg total) by mouth daily. Swallow whole., Disp: 30 tablet, Rfl: 12   atorvastatin (LIPITOR) 40 MG tablet, Take 1 tablet (40 mg total) by mouth daily., Disp: 30 tablet, Rfl: 0   busPIRone (BUSPAR) 5 MG tablet, Take 1 tablet (5 mg total) by mouth 2 (two) times daily. (Patient taking differently: Take 5 mg by mouth daily.), Disp: 60 tablet, Rfl: 0   escitalopram (LEXAPRO) 20 MG tablet, Take 1 tablet (20 mg total) by mouth at bedtime., Disp: 30 tablet, Rfl: 0   fluticasone (FLONASE) 50 MCG/ACT nasal spray, Place 1 spray into both nostrils daily., Disp: 1 g, Rfl: 0   gabapentin (NEURONTIN) 100 MG capsule, Take 2 capsules (200 mg total) by mouth at bedtime., Disp: 60 capsule, Rfl: 0   Glycerin, PF, (OPTASE COMFORT DRY EYE) 1 % SOLN, Place 1 drop into both eyes daily at 6 (six) AM., Disp: , Rfl:    hydrOXYzine (ATARAX) 25 MG tablet, Take 1 tablet (25 mg total) by mouth every 6 (six) hours as needed for anxiety., Disp: 30 tablet, Rfl: 0   latanoprost (XALATAN) 0.005 % ophthalmic solution, Place 1 drop into the left eye at bedtime., Disp: 2.5 mL, Rfl: 12   loratadine (CLARITIN) 10 MG tablet, Take 10 mg by mouth daily as needed for allergies. , Disp: , Rfl:    LUMIGAN 0.01 % SOLN, Place 1 drop into the right eye at bedtime., Disp: , Rfl:    meclizine (ANTIVERT) 25 MG tablet, Take 1 tablet (25 mg total) by mouth 3 (three) times daily as needed for dizziness. (Patient not taking: Reported on 08/31/2023), Disp: 30 tablet, Rfl: 0   mirtazapine (REMERON) 7.5 MG tablet, Take 1 tablet (7.5 mg total) by mouth at bedtime., Disp: 30 tablet, Rfl: 0   montelukast (SINGULAIR) 10 MG tablet, Take 1 tablet (10 mg total) by mouth daily., Disp: 30 tablet, Rfl: 0   Naphazoline-Pheniramine  (ALLERGY EYE OP), Place 1-2 drops into both eyes 2 (two) times daily as needed (ITCHY, WATERY EYES)., Disp: , Rfl:    pantoprazole (PROTONIX) 40 MG tablet, Take 1 tablet (40 mg total) by mouth daily before breakfast. (Patient not taking: Reported on 08/31/2023), Disp: 15 tablet, Rfl: 0   Plecanatide (TRULANCE) 3 MG TABS, Take 1 tablet by mouth daily. (Patient not taking: Reported on 08/31/2023), Disp: 30 tablet, Rfl: 6   senna-docusate (SENOKOT-S) 8.6-50 MG tablet, Take 2 tablets by mouth 2 (two) times daily. (Patient not taking: Reported on 08/31/2023), Disp: 30 tablet, Rfl: 0   SIMBRINZA 1-0.2 % SUSP, Apply 1 drop to eye 2 (two) times daily., Disp: , Rfl:    sodium chloride (OCEAN) 0.65 % SOLN nasal spray, Place 1 spray into both nostrils as needed for congestion., Disp: , Rfl:    traZODone (DESYREL) 50 MG tablet, Take 0.5 tablets (25 mg total) by mouth at bedtime as needed for sleep., Disp: 30 tablet, Rfl: 0  Vitals   Vitals:   09/10/23 1259  BP: (!) 164/75  Pulse: 65  Resp: 10  Temp: 98.2 F (36.8 C)  TempSrc: Oral  SpO2: 100%    There is no height or weight on file to calculate BMI.  Physical Exam   Constitutional: Appears well-developed and well-nourished.  Psych: Affect appropriate to situation.  Eyes: No scleral injection.  HENT: No OP obstruction.  Head: Normocephalic.  Cardiovascular: Normal  rate and regular rhythm.  Respiratory: Effort normal, non-labored breathing.  GI: Soft.  No distension. There is no tenderness.  Skin: WDI.   Neurologic Examination   Neuro: Mental Status: Patient is awake, alert, oriented to person, place, month, year, and situation. Patient has expressive aphasia with word finding difficulty, slightly improved throughout subsequent exams in ED. No signs of neglect Cranial Nerves: II: Visual Fields are full. Pupils are equal, round, and reactive to light.   III,IV, VI: EOMI without ptosis or diploplia.  V: Facial sensation is symmetric to  temperature VII: Facial movement is symmetric.  VIII: hearing is intact to voice X: Uvula elevates symmetrically XI: Shoulder shrug is symmetric. XII: tongue is midline without atrophy or fasciculations.  Motor: Tone is normal. Bulk is normal.  RUE: 5/5, no drift LUE: 4/5, no drift (on exam with additional encouragement) RLE: 5/5, no drift LLE: 3/5, drift. Does have antigravity strength. Giveaway weakness? Sensory: Sensation is symmetric to light touch in the arms and legs. Cerebellar: FNF slow but intact. Slower on left. No over ataxia seen.    Labs/Imaging/Neurodiagnostic studies   CBC:  Recent Labs  Lab Oct 06, 2023 1232 October 06, 2023 1233  WBC 7.8  --   NEUTROABS 4.9  --   HGB 12.5 11.9*  HCT 36.3 35.0*  MCV 89.9  --   PLT 221  --    Basic Metabolic Panel:  Lab Results  Component Value Date   NA 140 10-06-2023   K 3.3 (L) 10/06/23   CO2 24 08/31/2023   GLUCOSE 95 October 06, 2023   BUN 10 October 06, 2023   CREATININE 0.90 06-Oct-2023   CALCIUM 9.4 08/31/2023   GFRNONAA >60 08/31/2023   GFRAA >60 04/30/2020   Lipid Panel:  Lab Results  Component Value Date   LDLCALC 87 05/03/2023   HgbA1c:  Lab Results  Component Value Date   HGBA1C 5.9 (H) 05/03/2023   Urine Drug Screen:     Component Value Date/Time   LABOPIA NONE DETECTED 05/02/2023 1135   COCAINSCRNUR NONE DETECTED 05/02/2023 1135   LABBENZ NONE DETECTED 05/02/2023 1135   AMPHETMU NONE DETECTED 05/02/2023 1135   THCU NONE DETECTED 05/02/2023 1135   LABBARB NONE DETECTED 05/02/2023 1135    Alcohol Level     Component Value Date/Time   ETH <10 08/31/2023 1206   INR  Lab Results  Component Value Date   INR 1.0 10-06-23   APTT  Lab Results  Component Value Date   APTT 28 10/06/23   AED levels: No results found for: "PHENYTOIN", "ZONISAMIDE", "LAMOTRIGINE", "LEVETIRACETA"  CT Head without contrast(Personally reviewed): No hemorrhage or CT evidence of acute cortical infarct  CT angio Head and  Neck with perfusion(Personally reviewed): No LVO No evidence of core infarct or penumbra  MRI Brain(Personally reviewed): PENDING   ASSESSMENT   EMILIE KASKO is a 61 y.o. female with past medical history significant for conversion reaction, anxiety/depression,colon and endometrial cancer, hyperlipidemia, prediabetes who was brought in by EMS as a code stroke due to aphasia, left hemiparesis, abnormal gait.  Patient was reportedly found this morning by her son confused with left-sided weakness. On exam at bridge, patient had  left facial droop, bilateral leg weakness,expressive aphasia, dysarthria.  Her symptoms did seem to improve some throughout CT scan and subsequent evaluations.  NIH was 5.CT/CTA negative.Patient denies headache, SOB, n/v, dizziness or lightheadedness.  Her presentation is felt to be somewhat psychogenic/conversion disorder in nature but we will recommend MRI brain with and without considering her given history  to rule out stroke or metastasis etiologies for her symptoms.  RECOMMENDATIONS  -MRI brain with and without contrast -If MRI brain positive for acute stroke, will need to be admitted for full stroke workup - If MRI brain negative, no further neurology workup indicated at this time  -Recommend psych consult for repeated psychogenic complaints/history of conversion disorder   ADDENDUM:  MRI Brain is negative. Neurology will sign off. Please recall with further needs or any neurological issues. Thank you for this consult.  _______________________________________________________________   Pt seen by Neuro NP/APP and later by MD. Note/plan to be edited by MD as needed.    Lynnae January, DNP, AGACNP-BC [facility] Please use AMION for contact information & EPIC for messaging.   Attending Neurohospitalist Addendum Patient seen and examined with APP/Resident. Agree with the history and physical as documented above. Agree with the plan as  documented, which I helped formulate. I have edited the note above to reflect my full findings and recommendations. I have independently reviewed the chart, obtained history, review of systems and examined the patient.I have personally reviewed pertinent head/neck/spine imaging (CT/MRI). Please feel free to call with any questions.  -- Bing Neighbors, MD [facility] [phone]  If 7pm- 7am, please page neurology on call as listed in AMION.

## 2023-09-13 DIAGNOSIS — G969 Disorder of central nervous system, unspecified: Secondary | ICD-10-CM | POA: Diagnosis not present

## 2023-09-13 DIAGNOSIS — R4781 Slurred speech: Secondary | ICD-10-CM | POA: Diagnosis not present

## 2023-09-13 DIAGNOSIS — R42 Dizziness and giddiness: Secondary | ICD-10-CM | POA: Diagnosis not present

## 2023-09-13 DIAGNOSIS — I639 Cerebral infarction, unspecified: Secondary | ICD-10-CM | POA: Diagnosis not present

## 2023-09-13 DIAGNOSIS — R410 Disorientation, unspecified: Secondary | ICD-10-CM | POA: Diagnosis not present

## 2023-09-13 DIAGNOSIS — R1310 Dysphagia, unspecified: Secondary | ICD-10-CM | POA: Diagnosis not present

## 2023-09-13 DIAGNOSIS — R471 Dysarthria and anarthria: Secondary | ICD-10-CM | POA: Diagnosis not present

## 2023-09-13 DIAGNOSIS — R531 Weakness: Secondary | ICD-10-CM | POA: Diagnosis not present

## 2023-09-13 DIAGNOSIS — R4182 Altered mental status, unspecified: Secondary | ICD-10-CM | POA: Diagnosis not present

## 2023-09-13 DIAGNOSIS — I10 Essential (primary) hypertension: Secondary | ICD-10-CM | POA: Diagnosis not present

## 2023-09-13 DIAGNOSIS — F17210 Nicotine dependence, cigarettes, uncomplicated: Secondary | ICD-10-CM | POA: Diagnosis not present

## 2023-09-13 DIAGNOSIS — I69354 Hemiplegia and hemiparesis following cerebral infarction affecting left non-dominant side: Secondary | ICD-10-CM | POA: Diagnosis not present

## 2023-09-13 DIAGNOSIS — F39 Unspecified mood [affective] disorder: Secondary | ICD-10-CM | POA: Diagnosis not present

## 2023-09-13 DIAGNOSIS — R296 Repeated falls: Secondary | ICD-10-CM | POA: Diagnosis not present

## 2023-09-13 DIAGNOSIS — R29818 Other symptoms and signs involving the nervous system: Secondary | ICD-10-CM | POA: Diagnosis not present

## 2023-09-14 DIAGNOSIS — R531 Weakness: Secondary | ICD-10-CM

## 2023-09-14 DIAGNOSIS — M6281 Muscle weakness (generalized): Secondary | ICD-10-CM | POA: Insufficient documentation

## 2023-09-14 DIAGNOSIS — Z789 Other specified health status: Secondary | ICD-10-CM

## 2023-09-14 DIAGNOSIS — R4781 Slurred speech: Secondary | ICD-10-CM | POA: Diagnosis not present

## 2023-09-14 DIAGNOSIS — I639 Cerebral infarction, unspecified: Secondary | ICD-10-CM | POA: Diagnosis not present

## 2023-09-14 DIAGNOSIS — Z7409 Other reduced mobility: Secondary | ICD-10-CM | POA: Insufficient documentation

## 2023-09-14 HISTORY — DX: Other specified health status: Z78.9

## 2023-09-14 HISTORY — DX: Muscle weakness (generalized): M62.81

## 2023-09-14 HISTORY — DX: Weakness: R53.1

## 2023-09-16 DIAGNOSIS — R4781 Slurred speech: Secondary | ICD-10-CM | POA: Diagnosis not present

## 2023-09-16 DIAGNOSIS — F329 Major depressive disorder, single episode, unspecified: Secondary | ICD-10-CM | POA: Diagnosis not present

## 2023-09-16 DIAGNOSIS — Z20822 Contact with and (suspected) exposure to covid-19: Secondary | ICD-10-CM | POA: Diagnosis not present

## 2023-09-16 DIAGNOSIS — R29810 Facial weakness: Secondary | ICD-10-CM | POA: Diagnosis not present

## 2023-09-16 DIAGNOSIS — I6782 Cerebral ischemia: Secondary | ICD-10-CM | POA: Diagnosis not present

## 2023-09-16 DIAGNOSIS — I10 Essential (primary) hypertension: Secondary | ICD-10-CM | POA: Diagnosis not present

## 2023-09-16 DIAGNOSIS — H539 Unspecified visual disturbance: Secondary | ICD-10-CM | POA: Diagnosis not present

## 2023-09-16 DIAGNOSIS — R531 Weakness: Secondary | ICD-10-CM | POA: Diagnosis not present

## 2023-09-16 DIAGNOSIS — I959 Hypotension, unspecified: Secondary | ICD-10-CM | POA: Diagnosis not present

## 2023-09-16 DIAGNOSIS — R471 Dysarthria and anarthria: Secondary | ICD-10-CM | POA: Diagnosis not present

## 2023-09-16 DIAGNOSIS — F17210 Nicotine dependence, cigarettes, uncomplicated: Secondary | ICD-10-CM | POA: Diagnosis not present

## 2023-09-16 DIAGNOSIS — R29818 Other symptoms and signs involving the nervous system: Secondary | ICD-10-CM | POA: Diagnosis not present

## 2023-09-16 DIAGNOSIS — R0789 Other chest pain: Secondary | ICD-10-CM | POA: Diagnosis not present

## 2023-09-16 DIAGNOSIS — E785 Hyperlipidemia, unspecified: Secondary | ICD-10-CM | POA: Diagnosis not present

## 2023-09-17 DIAGNOSIS — R531 Weakness: Secondary | ICD-10-CM | POA: Diagnosis not present

## 2023-09-18 DIAGNOSIS — R531 Weakness: Secondary | ICD-10-CM | POA: Diagnosis not present

## 2023-09-19 DIAGNOSIS — R531 Weakness: Secondary | ICD-10-CM | POA: Diagnosis not present

## 2023-09-19 DIAGNOSIS — R001 Bradycardia, unspecified: Secondary | ICD-10-CM | POA: Diagnosis not present

## 2023-09-20 DIAGNOSIS — R531 Weakness: Secondary | ICD-10-CM | POA: Diagnosis not present

## 2023-09-20 DIAGNOSIS — M6281 Muscle weakness (generalized): Secondary | ICD-10-CM | POA: Diagnosis not present

## 2023-09-23 DIAGNOSIS — F69 Unspecified disorder of adult personality and behavior: Secondary | ICD-10-CM | POA: Diagnosis not present

## 2023-09-23 DIAGNOSIS — I10 Essential (primary) hypertension: Secondary | ICD-10-CM | POA: Diagnosis not present

## 2023-09-23 DIAGNOSIS — R531 Weakness: Secondary | ICD-10-CM | POA: Diagnosis not present

## 2023-09-23 DIAGNOSIS — E785 Hyperlipidemia, unspecified: Secondary | ICD-10-CM | POA: Diagnosis not present

## 2023-09-23 DIAGNOSIS — R042 Hemoptysis: Secondary | ICD-10-CM | POA: Diagnosis not present

## 2023-09-23 DIAGNOSIS — F331 Major depressive disorder, recurrent, moderate: Secondary | ICD-10-CM | POA: Diagnosis not present

## 2023-09-23 DIAGNOSIS — F419 Anxiety disorder, unspecified: Secondary | ICD-10-CM | POA: Diagnosis not present

## 2023-09-23 DIAGNOSIS — R251 Tremor, unspecified: Secondary | ICD-10-CM | POA: Diagnosis not present

## 2023-09-23 DIAGNOSIS — Z7982 Long term (current) use of aspirin: Secondary | ICD-10-CM | POA: Diagnosis not present

## 2023-09-23 DIAGNOSIS — R471 Dysarthria and anarthria: Secondary | ICD-10-CM | POA: Diagnosis not present

## 2023-09-23 DIAGNOSIS — I639 Cerebral infarction, unspecified: Secondary | ICD-10-CM | POA: Diagnosis not present

## 2023-09-23 DIAGNOSIS — R262 Difficulty in walking, not elsewhere classified: Secondary | ICD-10-CM

## 2023-09-23 DIAGNOSIS — R29818 Other symptoms and signs involving the nervous system: Secondary | ICD-10-CM | POA: Diagnosis not present

## 2023-09-23 DIAGNOSIS — Z7902 Long term (current) use of antithrombotics/antiplatelets: Secondary | ICD-10-CM | POA: Diagnosis not present

## 2023-09-23 HISTORY — DX: Difficulty in walking, not elsewhere classified: R26.2

## 2023-09-23 NOTE — Telephone Encounter (Signed)
 Error

## 2023-09-24 DIAGNOSIS — F331 Major depressive disorder, recurrent, moderate: Secondary | ICD-10-CM | POA: Diagnosis not present

## 2023-09-24 DIAGNOSIS — R262 Difficulty in walking, not elsewhere classified: Secondary | ICD-10-CM | POA: Diagnosis not present

## 2023-09-24 DIAGNOSIS — F419 Anxiety disorder, unspecified: Secondary | ICD-10-CM | POA: Diagnosis not present

## 2023-09-25 DIAGNOSIS — R262 Difficulty in walking, not elsewhere classified: Secondary | ICD-10-CM | POA: Diagnosis not present

## 2023-09-28 ENCOUNTER — Inpatient Hospital Stay: Payer: Medicare HMO | Admitting: Internal Medicine

## 2023-09-28 ENCOUNTER — Inpatient Hospital Stay: Payer: Medicare HMO

## 2023-09-28 DIAGNOSIS — Z20822 Contact with and (suspected) exposure to covid-19: Secondary | ICD-10-CM | POA: Diagnosis not present

## 2023-09-28 DIAGNOSIS — R29810 Facial weakness: Secondary | ICD-10-CM | POA: Diagnosis not present

## 2023-09-28 DIAGNOSIS — Z7902 Long term (current) use of antithrombotics/antiplatelets: Secondary | ICD-10-CM | POA: Diagnosis not present

## 2023-09-28 DIAGNOSIS — F445 Conversion disorder with seizures or convulsions: Secondary | ICD-10-CM | POA: Diagnosis not present

## 2023-09-28 DIAGNOSIS — Z79899 Other long term (current) drug therapy: Secondary | ICD-10-CM | POA: Diagnosis not present

## 2023-09-28 DIAGNOSIS — Z7982 Long term (current) use of aspirin: Secondary | ICD-10-CM | POA: Diagnosis not present

## 2023-09-28 DIAGNOSIS — R4781 Slurred speech: Secondary | ICD-10-CM | POA: Diagnosis not present

## 2023-09-28 DIAGNOSIS — R569 Unspecified convulsions: Secondary | ICD-10-CM | POA: Diagnosis not present

## 2023-09-28 DIAGNOSIS — R410 Disorientation, unspecified: Secondary | ICD-10-CM | POA: Diagnosis not present

## 2023-09-28 DIAGNOSIS — Z6379 Other stressful life events affecting family and household: Secondary | ICD-10-CM | POA: Diagnosis not present

## 2023-09-29 ENCOUNTER — Encounter (HOSPITAL_COMMUNITY): Payer: Self-pay

## 2023-09-29 ENCOUNTER — Emergency Department (HOSPITAL_COMMUNITY): Payer: Medicare HMO

## 2023-09-29 ENCOUNTER — Other Ambulatory Visit: Payer: Self-pay

## 2023-09-29 ENCOUNTER — Emergency Department (HOSPITAL_COMMUNITY)
Admission: EM | Admit: 2023-09-29 | Discharge: 2023-09-29 | Disposition: A | Discharge status: Home / Self Care | Payer: Medicare HMO | Attending: Emergency Medicine | Admitting: Emergency Medicine

## 2023-09-29 DIAGNOSIS — Z8542 Personal history of malignant neoplasm of other parts of uterus: Secondary | ICD-10-CM | POA: Insufficient documentation

## 2023-09-29 DIAGNOSIS — R791 Abnormal coagulation profile: Secondary | ICD-10-CM | POA: Insufficient documentation

## 2023-09-29 DIAGNOSIS — Z7982 Long term (current) use of aspirin: Secondary | ICD-10-CM | POA: Insufficient documentation

## 2023-09-29 DIAGNOSIS — F445 Conversion disorder with seizures or convulsions: Secondary | ICD-10-CM | POA: Diagnosis not present

## 2023-09-29 DIAGNOSIS — Z7951 Long term (current) use of inhaled steroids: Secondary | ICD-10-CM | POA: Insufficient documentation

## 2023-09-29 DIAGNOSIS — R251 Tremor, unspecified: Secondary | ICD-10-CM | POA: Diagnosis not present

## 2023-09-29 DIAGNOSIS — I10 Essential (primary) hypertension: Secondary | ICD-10-CM | POA: Diagnosis not present

## 2023-09-29 DIAGNOSIS — J45909 Unspecified asthma, uncomplicated: Secondary | ICD-10-CM | POA: Diagnosis not present

## 2023-09-29 DIAGNOSIS — F447 Conversion disorder with mixed symptom presentation: Secondary | ICD-10-CM | POA: Diagnosis not present

## 2023-09-29 DIAGNOSIS — Z79899 Other long term (current) drug therapy: Secondary | ICD-10-CM | POA: Insufficient documentation

## 2023-09-29 DIAGNOSIS — R4781 Slurred speech: Secondary | ICD-10-CM | POA: Diagnosis not present

## 2023-09-29 DIAGNOSIS — R29818 Other symptoms and signs involving the nervous system: Secondary | ICD-10-CM | POA: Diagnosis not present

## 2023-09-29 DIAGNOSIS — R9431 Abnormal electrocardiogram [ECG] [EKG]: Secondary | ICD-10-CM | POA: Diagnosis not present

## 2023-09-29 DIAGNOSIS — R29810 Facial weakness: Secondary | ICD-10-CM | POA: Diagnosis not present

## 2023-09-29 LAB — RAPID URINE DRUG SCREEN, HOSP PERFORMED
Amphetamines: NOT DETECTED
Barbiturates: NOT DETECTED
Benzodiazepines: POSITIVE — AB
Cocaine: NOT DETECTED
Opiates: NOT DETECTED
Tetrahydrocannabinol: NOT DETECTED

## 2023-09-29 LAB — CBC
HCT: 36.3 % (ref 36.0–46.0)
Hemoglobin: 12.2 g/dL (ref 12.0–15.0)
MCH: 31 pg (ref 26.0–34.0)
MCHC: 33.6 g/dL (ref 30.0–36.0)
MCV: 92.4 fL (ref 80.0–100.0)
Platelets: 251 10*3/uL (ref 150–400)
RBC: 3.93 MIL/uL (ref 3.87–5.11)
RDW: 13.2 % (ref 11.5–15.5)
WBC: 8.6 10*3/uL (ref 4.0–10.5)
nRBC: 0 % (ref 0.0–0.2)

## 2023-09-29 LAB — COMPREHENSIVE METABOLIC PANEL
ALT: 18 U/L (ref 0–44)
AST: 18 U/L (ref 15–41)
Albumin: 3.8 g/dL (ref 3.5–5.0)
Alkaline Phosphatase: 38 U/L (ref 38–126)
Anion gap: 8 (ref 5–15)
BUN: 11 mg/dL (ref 6–20)
CO2: 26 mmol/L (ref 22–32)
Calcium: 9.1 mg/dL (ref 8.9–10.3)
Chloride: 106 mmol/L (ref 98–111)
Creatinine, Ser: 0.79 mg/dL (ref 0.44–1.00)
GFR, Estimated: >60 mL/min (ref >60)
Glucose, Bld: 115 mg/dL — ABNORMAL HIGH (ref 70–99)
Potassium: 4.1 mmol/L (ref 3.5–5.1)
Sodium: 140 mmol/L (ref 135–145)
Total Bilirubin: 0.7 mg/dL (ref 0.0–1.2)
Total Protein: 6.3 g/dL — ABNORMAL LOW (ref 6.5–8.1)

## 2023-09-29 LAB — I-STAT CHEM 8, ED
BUN: 12 mg/dL (ref 6–20)
Calcium, Ion: 1.09 mmol/L — ABNORMAL LOW (ref 1.15–1.40)
Chloride: 105 mmol/L (ref 98–111)
Creatinine, Ser: 0.8 mg/dL (ref 0.44–1.00)
Glucose, Bld: 110 mg/dL — ABNORMAL HIGH (ref 70–99)
HCT: 35 % — ABNORMAL LOW (ref 36.0–46.0)
Hemoglobin: 11.9 g/dL — ABNORMAL LOW (ref 12.0–15.0)
Potassium: 4.1 mmol/L (ref 3.5–5.1)
Sodium: 139 mmol/L (ref 135–145)
TCO2: 24 mmol/L (ref 22–32)

## 2023-09-29 LAB — URINALYSIS, ROUTINE W REFLEX MICROSCOPIC
Bacteria, UA: NONE SEEN
Bilirubin Urine: NEGATIVE
Glucose, UA: NEGATIVE mg/dL
Ketones, ur: NEGATIVE mg/dL
Leukocytes,Ua: NEGATIVE
Nitrite: NEGATIVE
Protein, ur: NEGATIVE mg/dL
Specific Gravity, Urine: 1.006 (ref 1.005–1.030)
pH: 6 (ref 5.0–8.0)

## 2023-09-29 LAB — DIFFERENTIAL
Abs Immature Granulocytes: 0.04 10*3/uL (ref 0.00–0.07)
Basophils Absolute: 0 10*3/uL (ref 0.0–0.1)
Basophils Relative: 0 %
Eosinophils Absolute: 0 10*3/uL (ref 0.0–0.5)
Eosinophils Relative: 0 %
Immature Granulocytes: 1 %
Lymphocytes Relative: 24 %
Lymphs Abs: 2.1 10*3/uL (ref 0.7–4.0)
Monocytes Absolute: 0.5 10*3/uL (ref 0.1–1.0)
Monocytes Relative: 5 %
Neutro Abs: 6 10*3/uL (ref 1.7–7.7)
Neutrophils Relative %: 70 %

## 2023-09-29 LAB — APTT: aPTT: 29 s (ref 24–36)

## 2023-09-29 LAB — PROTIME-INR
INR: 1 (ref 0.8–1.2)
Prothrombin Time: 13.7 s (ref 11.4–15.2)

## 2023-09-29 LAB — ETHANOL: Alcohol, Ethyl (B): <10 mg/dL (ref <10)

## 2023-09-29 LAB — CBG MONITORING, ED: Glucose-Capillary: 108 mg/dL — ABNORMAL HIGH (ref 70–99)

## 2023-09-29 MED ORDER — LORAZEPAM 2 MG/ML IJ SOLN
1.0000 mg | Freq: Once | INTRAMUSCULAR | Status: AC
  Administered 2023-09-29: 1 mg via INTRAVENOUS
  Filled 2023-09-29: qty 1

## 2023-09-29 NOTE — Discharge Instructions (Addendum)
 There was no evidence of stroke or seizure seen on your evaluation today Labs were essentially within normal limits MRI shows a possibility of a small area of abnormality that is unlikely to cause any symptoms and has been present before Please follow-up with your doctor as soon as possible Do not drive, operate heavy machinery, or anything else that would be dangerous with sedating medications or tremors

## 2023-09-29 NOTE — ED Notes (Signed)
 Patient provided cab voucher per Julia Osborne, Julia Osborne. Patient alert and oriented at time of discharge, patient ambulated to restroom independently prior to discharge.

## 2023-09-29 NOTE — ED Notes (Signed)
 Pt states her mouth feels numb & her speech mirrors that by her lips not fully moving as she speaks. A/Ox4 at this time & reports to this RN she was seen at [facility] yesterday for this same reason & was going to call to make a follow-up Neurologist appointment today which she had not been able to do yet before the same symptoms came back & she was brought here to ED. States she last remembers speaking on the phone then she realized she was being spoken to by the EMT on their truck.

## 2023-09-29 NOTE — ED Notes (Signed)
 Patient transported to MRI

## 2023-09-29 NOTE — ED Triage Notes (Addendum)
 Pt BIB [employer] as Code Stroke, LKN 0700 from home d/t the people she was speaking on the phone with about her insurance since d/c from [place] yesterday calling on her behalf d/t seizure. EMS arrived on scene & reports she was having tremor, no unilateral weakness, but did have a intermittent facial droop plus slurred speech. CBG 18, 164/84, 66 bpm, 99% on RA, no PIV, son was unsure of her Hx on scene. Pt does report taking plavix , last dose took yesterday.

## 2023-09-29 NOTE — Consult Note (Signed)
 NEUROLOGY CONSULT NOTE   Date of service: September 29, 2023 Patient Name: Julia Osborne MRN:  [id number] DOB:  15-Nov-1960 Chief Complaint: code stroke - left facial droop off and on Requesting Provider: Levander Houston, MD  History of Present Illness  63/f who has a past medical history ofConversion disorder, anxiety, depression, colon and endometrial cancer, hypertension hyperlipidemia and prediabetes brought in by EMS as code stroke team to intermittent left facial weakness and slurred speech.  Last known well was 7 AM when the son saw her.  She was noted to have the symptoms of left-sided weakness and slurred speech afterwards and EMS was called.  She continues to be on Plavix . She was seen at [facility] ER yesterday for seizure-like episode and discharged home after a reassuring workup. Denies tingling numbness but reports weakness all over.  While getting the CT scan, she started having tremulousness in all her body and remained awake and conversant. She said she knows she is not having a stroke which she is having a seizure.  LKW: 0 700 Modified rankin score: 0-Completely asymptomatic and back to baseline post- stroke IV Thrombolysis: Exam with functional overlay-not consistent with stroke  EVT: Same as above  NIHSS components Score: Comment  1a Level of Conscious 0[x]  1[]  2[]  3[]      1b LOC Questions 0[x]  1[]  2[]       1c LOC Commands 0[x]  1[]  2[]       2 Best Gaze 0[x]  1[]  2[]       3 Visual 0[x]  1[]  2[]  3[]      4 Facial Palsy 0[x]  1[]  2[]  3[]      5a Motor Arm - left 0[x]  1[]  2[]  3[]  4[]  UN[]    5b Motor Arm - Right 0[x]  1[]  2[]  3[]  4[]  UN[]    6a Motor Leg - Left 0[x]  1[]  2[]  3[]  4[]  UN[]    6b Motor Leg - Right 0[x]  1[]  2[]  3[]  4[]  UN[]    7 Limb Ataxia 0[x]  1[]  2[]  3[]  UN[]     8 Sensory 0[x]  1[]  2[]  UN[]      9 Best Language 0[x]  1[]  2[]  3[]      10 Dysarthria 0[x]  1[]  2[]  UN[]      11 Extinct. and Inattention 0[x]  1[]  2[]       TOTAL: 0      ROS  Positive findings in  HPI-remainder negative  Past History   Past Medical History:  Diagnosis Date   Abdominal pain, acute, left upper quadrant    Abdominal pain, epigastric    Abnormal CT scan, sigmoid colon    Abnormal vaginal Pap smear    Adenocarcinoma of colon (HCC) 09/13/2015   Partial colon resection and chemo tx's.    Anemia    Anxiety    Anxiety disorder, unspecified    Aphasia    Asthma    Cancer (HCC)    endometrial; cancer cells in intestine   Chronic pain of left knee    Colon neoplasm    Constipation    Conversion reaction    COPD (chronic obstructive pulmonary disease) (HCC)    no definite diagnosis   Depression    Diverticulitis    Diverticulosis of colon without hemorrhage    Dyspareunia 05/16/2015   Dysphasia    Endometrial cancer (HCC)    Esophageal dysphagia    Family history of cancer    Family history of kidney cancer    Ganglion of left knee    Gastric intestinal metaplasia    GERD (gastroesophageal reflux disease)    Glaucoma  Hematochezia    History of colonic polyps    Hyperlipidemia    Indigestion    Lynch syndrome    Mucosal abnormality of stomach    Nausea and vomiting    Neuropathy    feet and hands   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 drug (HCC)    PONV (postoperative nausea and vomiting)    Pre-diabetes    Primary osteoarthritis of left knee    Rectal bleeding    Reflux esophagitis    Soft tissue swelling of knee joint    Uterine fibroid    UTI (urinary tract infection) 04/26/2023   on cipro    Vaginal dryness 05/16/2015   Vaginal itching    Vaginal Pap smear, abnormal     Past Surgical History:  Procedure Laterality Date   ABDOMINAL HYSTERECTOMY     APPENDECTOMY     BIOPSY N/A 03/14/2015   Procedure: BIOPSY;  Surgeon: Lamar CHRISTELLA Hollingshead, MD;  Location: [facility];  Service: Endoscopy;  Laterality: N/A;  Gastric   COLONOSCOPY N/A 01/28/2017   Procedure: COLONOSCOPY;  Surgeon: Hollingshead Lamar CHRISTELLA, MD;  Location: [facility];  Service: Endoscopy;   Laterality: N/A;  11:30am   COLONOSCOPY WITH PROPOFOL  N/A 03/14/2015   RMR: Internal hemorrhoids. colonic diverticulosis. Incomplete examination. Prepartation inadequate.   COLONOSCOPY WITH PROPOFOL  N/A 07/04/2015   RMR: Colonic diverticulosis . Large polypoid lesion in the vicinity of the hepatic flexure status post saline-assisted piecmeal snare polypectomy  with ablation and tattooing as described. Sigmoid polyp removed as described above. sigmoid colon polyp hyperplastic, hepatic flexure polyp with TA with focal high grade dysplasia    COLONOSCOPY WITH PROPOFOL  N/A 11/03/2018   Procedure: COLONOSCOPY WITH PROPOFOL ;  Surgeon: Therisa Bi, MD;  Location: [facility] ENDOSCOPY;  Service: Gastroenterology;  Laterality: N/A;   COLONOSCOPY WITH PROPOFOL  N/A 12/09/2020   Procedure: COLONOSCOPY WITH PROPOFOL ;  Surgeon: Therisa Bi, MD;  Location: [facility] ENDOSCOPY;  Service: Gastroenterology;  Laterality: N/A;  COVID POSITIVE 10/30/2020   COLONOSCOPY WITH PROPOFOL  N/A 01/20/2021   Procedure: COLONOSCOPY WITH PROPOFOL ;  Surgeon: Therisa Bi, MD;  Location: [facility] ENDOSCOPY;  Service: Gastroenterology;  Laterality: N/A;   COLONOSCOPY WITH PROPOFOL  N/A 12/10/2021   Procedure: COLONOSCOPY WITH PROPOFOL ;  Surgeon: Therisa Bi, MD;  Location: [facility] ENDOSCOPY;  Service: Gastroenterology;  Laterality: N/A;   COLONOSCOPY WITH PROPOFOL  N/A 02/16/2023   Procedure: COLONOSCOPY WITH PROPOFOL ;  Surgeon: Therisa Bi, MD;  Location: [facility] ENDOSCOPY;  Service: Gastroenterology;  Laterality: N/A;   ENTEROSCOPY N/A 02/12/2020   Procedure: ENTEROSCOPY;  Surgeon: Therisa Bi, MD;  Location: [facility] ENDOSCOPY;  Service: Gastroenterology;  Laterality: N/A;   ESOPHAGEAL DILATION N/A 03/14/2015   Procedure: ESOPHAGEAL DILATION;  Surgeon: Lamar CHRISTELLA Hollingshead, MD;  Location: [facility];  Service: Endoscopy;  Laterality: N/AMERL [address]   ESOPHAGOGASTRODUODENOSCOPY N/A 05/19/2016   Procedure: ESOPHAGOGASTRODUODENOSCOPY (EGD);  Surgeon: Lamar CHRISTELLA Hollingshead,  MD;  Location: [facility];  Service: Endoscopy;  Laterality: N/A;  215   ESOPHAGOGASTRODUODENOSCOPY N/A 01/28/2017   Procedure: ESOPHAGOGASTRODUODENOSCOPY (EGD);  Surgeon: Hollingshead Lamar CHRISTELLA, MD;  Location: [facility];  Service: Endoscopy;  Laterality: N/A;   ESOPHAGOGASTRODUODENOSCOPY N/A 12/10/2021   Procedure: ESOPHAGOGASTRODUODENOSCOPY (EGD);  Surgeon: Therisa Bi, MD;  Location: [facility] ENDOSCOPY;  Service: Gastroenterology;  Laterality: N/A;   ESOPHAGOGASTRODUODENOSCOPY (EGD) WITH PROPOFOL  N/A 03/14/2015   RMR: Mild erosive reflux esophagitis status post passage o f a Maloney dilator. Abnormal gastric mucosa of uncertain significance as described above. status post biopsy, benign   ESOPHAGOGASTRODUODENOSCOPY (EGD) WITH PROPOFOL  N/A  11/18/2017   Procedure: ESOPHAGOGASTRODUODENOSCOPY (EGD) WITH PROPOFOL ;  Surgeon: Shaaron Lamar HERO, MD;  Location: [facility];  Service: Endoscopy;  Laterality: N/A;  12:15pm   ESOPHAGOGASTRODUODENOSCOPY (EGD) WITH PROPOFOL  N/A 11/03/2018   Procedure: ESOPHAGOGASTRODUODENOSCOPY (EGD) WITH PROPOFOL ;  Surgeon: Therisa Bi, MD;  Location: [facility] ENDOSCOPY;  Service: Gastroenterology;  Laterality: N/A;   ESOPHAGOGASTRODUODENOSCOPY (EGD) WITH PROPOFOL  N/A 02/16/2023   Procedure: ESOPHAGOGASTRODUODENOSCOPY (EGD) WITH PROPOFOL ;  Surgeon: Therisa Bi, MD;  Location: [facility] ENDOSCOPY;  Service: Gastroenterology;  Laterality: N/A;   GIVENS CAPSULE STUDY N/A 12/25/2019   Procedure: GIVENS CAPSULE STUDY;  Surgeon: Therisa Bi, MD;  Location: [facility] ENDOSCOPY;  Service: Gastroenterology;  Laterality: N/A;   MALONEY DILATION N/A 05/19/2016   Procedure: AGAPITO DILATION;  Surgeon: Lamar HERO Shaaron, MD;  Location: [facility];  Service: Endoscopy;  Laterality: N/A;   MALONEY DILATION N/A 01/28/2017   Procedure: AGAPITO DILATION;  Surgeon: Shaaron Lamar HERO, MD;  Location: [facility];  Service: Endoscopy;  Laterality: N/A;   MALONEY DILATION N/A 11/18/2017   Procedure: AGAPITO  DILATION;  Surgeon: Shaaron Lamar HERO, MD;  Location: [facility];  Service: Endoscopy;  Laterality: N/A;   PARTIAL COLECTOMY  08/30/2015   polyp with adenocarcinoma   PARTIAL COLECTOMY  08/29/2018   POLYPECTOMY N/A 07/04/2015   Procedure: POLYPECTOMY;  Surgeon: Lamar HERO Shaaron, MD;  Location: [facility];  Service: Endoscopy;  Laterality: N/A;   PORTACATH PLACEMENT Right 09/21/2014   TUBAL LIGATION      Family History: Family History  Problem Relation Age of Onset   Other Mother        clot that went to heart, deceased age 65ss   Heart attack Father        age 44s, deceased   Stroke Father    Cancer Father        at death determined he was ate up with cancer   Hypertension Sister    Kidney cancer Sister 38   Diabetes Brother    Hypertension Brother    Endometriosis Daughter    Heart attack Maternal Grandfather    Diabetes Sister    Brain cancer Paternal Aunt    Cancer Paternal Uncle        NOS   Cancer Paternal Uncle        NOS   Cancer Paternal Uncle        NOS   Colon cancer Neg Hx     Social History  reports that she has never smoked. She has never used smokeless tobacco. She reports that she does not drink alcohol  and does not use drugs.  Allergies  Allergen Reactions   Codeine Nausea And Vomiting and Rash    Medications  No current facility-administered medications for this encounter.  Current Outpatient Medications:    albuterol  (PROVENTIL  HFA;VENTOLIN  HFA) 108 (90 Base) MCG/ACT inhaler, Inhale 2 puffs into the lungs every 2 (two) hours as needed for wheezing or shortness of breath (cough)., Disp: 1 Inhaler, Rfl: 3   albuterol  (PROVENTIL ) (2.5 MG/3ML) 0.083% nebulizer solution, Take 3 mLs (2.5 mg total) by nebulization every 6 (six) hours as needed for wheezing or shortness of breath., Disp: 75 mL, Rfl: 12   aspirin  EC 81 MG tablet, Take 1 tablet (81 mg total) by mouth daily. Swallow whole., Disp: 30 tablet, Rfl: 12   atorvastatin  (LIPITOR) 40 MG tablet, Take 1  tablet (40 mg total) by mouth daily., Disp: 30 tablet, Rfl: 0   busPIRone  (BUSPAR ) 5 MG tablet, Take  1 tablet (5 mg total) by mouth 2 (two) times daily. (Patient taking differently: Take 5 mg by mouth daily.), Disp: 60 tablet, Rfl: 0   escitalopram  (LEXAPRO ) 20 MG tablet, Take 1 tablet (20 mg total) by mouth at bedtime., Disp: 30 tablet, Rfl: 0   fluticasone  (FLONASE ) 50 MCG/ACT nasal spray, Place 1 spray into both nostrils daily., Disp: 1 g, Rfl: 0   gabapentin  (NEURONTIN ) 100 MG capsule, Take 2 capsules (200 mg total) by mouth at bedtime., Disp: 60 capsule, Rfl: 0   Glycerin, PF, (OPTASE COMFORT DRY EYE) 1 % SOLN, Place 1 drop into both eyes daily at 6 (six) AM., Disp: , Rfl:    hydrOXYzine  (ATARAX ) 25 MG tablet, Take 1 tablet (25 mg total) by mouth every 6 (six) hours as needed for anxiety., Disp: 30 tablet, Rfl: 0   latanoprost  (XALATAN ) 0.005 % ophthalmic solution, Place 1 drop into the left eye at bedtime., Disp: 2.5 mL, Rfl: 12   loratadine (CLARITIN) 10 MG tablet, Take 10 mg by mouth daily as needed for allergies. , Disp: , Rfl:    LUMIGAN 0.01 % SOLN, Place 1 drop into the right eye at bedtime., Disp: , Rfl:    meclizine  (ANTIVERT ) 25 MG tablet, Take 1 tablet (25 mg total) by mouth 3 (three) times daily as needed for dizziness. (Patient not taking: Reported on 08/31/2023), Disp: 30 tablet, Rfl: 0   mirtazapine  (REMERON ) 7.5 MG tablet, Take 1 tablet (7.5 mg total) by mouth at bedtime., Disp: 30 tablet, Rfl: 0   montelukast  (SINGULAIR ) 10 MG tablet, Take 1 tablet (10 mg total) by mouth daily. (Patient not taking: Reported on 09/10/2023), Disp: 30 tablet, Rfl: 0   Naphazoline-Pheniramine (ALLERGY EYE OP), Place 1-2 drops into both eyes 2 (two) times daily as needed (ITCHY, WATERY EYES)., Disp: , Rfl:    pantoprazole  (PROTONIX ) 40 MG tablet, Take 1 tablet (40 mg total) by mouth daily before breakfast. (Patient not taking: Reported on 08/31/2023), Disp: 15 tablet, Rfl: 0   Plecanatide  (TRULANCE ) 3  MG TABS, Take 1 tablet by mouth daily. (Patient not taking: Reported on 08/31/2023), Disp: 30 tablet, Rfl: 6   senna-docusate (SENOKOT-S) 8.6-50 MG tablet, Take 2 tablets by mouth 2 (two) times daily. (Patient not taking: Reported on 08/31/2023), Disp: 30 tablet, Rfl: 0   SIMBRINZA 1-0.2 % SUSP, Apply 1 drop to eye 2 (two) times daily. (Patient not taking: Reported on 09/10/2023), Disp: , Rfl:    sodium chloride  (OCEAN) 0.65 % SOLN nasal spray, Place 1 spray into both nostrils as needed for congestion., Disp: , Rfl:    traZODone  (DESYREL ) 50 MG tablet, Take 0.5 tablets (25 mg total) by mouth at bedtime as needed for sleep., Disp: 30 tablet, Rfl: 0  Vitals   Vitals:   09/29/23 1100  Weight: 50 kg    Body mass index is 19.53 kg/m.  Physical Exam  General: Somewhat cachectic looking woman in no distress HEENT: Normocephalic atraumatic Lungs: Clear Cardiovascular: Regular rate rhythm Abdomen nondistended nontender Neurological exam She is awake alert oriented x 3 Her speech has nonphysiological started No evidence of aphasia Cranial nerves II to XII: Pupils equal round react light, extraocular movements intact, visual fields full, face appears grossly symmetric although she has volitional weakness in smiling completely but there is no focality in this exam, tongue and palate midline. Motor examination with no drift in any of the 4 extremities although she required a lot of coaching to keep the hands up for 10 seconds  and legs up for 5 seconds without drift. Coordination examination no dysmetria Sensation intact  Labs/Imaging/Neurodiagnostic studies  Labs pending  CT Head without contrast(Personally reviewed): No acute changes  ASSESSMENT   63 year old woman past history of conversion disorder anxiety depression colon and endometrial cancer hypertension hyperlipidemia prediabetes brought in as a code stroke due to intermittent left facial weakness and slurred speech.  Exam has a lot  of nonorganic findings.  Stat MRI was done which is negative for stroke. She has been evaluated for seizure-like activity at outside hospitals as well as I have witnessed events in the hospital which are consistent with functional neurological disorder.   RECOMMENDATIONS  No need for inpatient workup at this time Can follow-up with outpatient neurology. Although it has been recommended multiple times, she has not received any psychiatry referrals or follow-ups.  I strongly recommend a psychiatry follow-up for evaluation given multiple psychogenic episodes that lead to emergent evaluations without much help for her.  Plan relayed to Dr. Levander ______________________________________________________________________    Signed, Eligio Lav, MD [facility]

## 2023-09-29 NOTE — ED Provider Notes (Signed)
 [place] EMERGENCY DEPARTMENT AT [facility] Provider Note   CSN: [id number] Arrival date & time: 09/29/23  1106  An emergency department physician performed an initial assessment on this suspected stroke patient at 62.  History  Chief Complaint  Patient presents with   Code Stroke    Julia Osborne is a 63 y.o. female.  HPI Patient seen in hallway and ct scanner as code stroke. Airway intact 63 yo female presents via ems as code stroke.  Patient with reported episode of shaking yesterday seen at [facility].  She was thought to have possible seizure type activity.  She was at home today and reported with last known normal 7 AM.  She was reported to have been on the phone with insurance when she began having abnormal vocalization but they thought might have been a seizure.  They called EMS.  EMS reports that she had some facial droop on the right side on their initial evaluation but was able to move all extremities equally.  Prehospital blood pressure was reported to be elevated with normal blood sugar.   Reviewed report from [facility] yesterday and as below Julia Osborne is a 63 y.o. female with a history significant for asthma, history of uterine cancer, multiple recent hospital visits for seizure-like activity and strokelike symptoms who presents with reported seizure-like activity. EMS reports that she seized at home and then she apparently seized again in front of staff. She has been seen multiple times at this hospital and surrounding hospitals for similar events over the past few months. She reports that these events go back to August. She describes that she feels a general sensation of anxiety all the time and that sometimes anxiety gets worse. When her anxiety gets worse, she feels tremors start then she becomes lightheaded and feels her heart racing. Usually she manages to get onto the ground and begins to shake more violently, though she is able  to recall most of these events and only rarely loses consciousness. She has had an EEG at [facility] in late December that did not demonstrate seizures or epileptiform discharges per their interpretation of the study. She has had multiple MRIs both without and with and without, none of which demonstrate concerning findings per radiology reads. When asked if she believes these episodes are related to stress, she reports of course they are related to stress. When asked what causes her stress, she reports that she has 5 dogs that are constantly pushing her, tripping her, barking at her, not letting her have any privacy, and in her meals. She lives with her son and reports that there are no stressors related to her living situation other than her dogs. She does not feel threatened. She has no SI/HI/AVH. She currently reports mild headache, she denies neck pain, focal weakness, sensory changes, chest pain. She does endorse a racing heart rate at times with occasional skipped beats. She is unable to explain why she takes antiplatelets and believes that it was because of a TIA in the late summer 2024. On my review of her chart, she was started on antiplatelets due to concern that these events may represent TIA. She is unable to tell me what medication she takes at home. I explained to her that these events are most likely psychogenic nonepileptic spells and reviewed the typical management plan for these including neuro and psych follow-up. She is agreeable to continuing follow-up and states that she feels safe going home  if she can get a ride. She is not interested in any medication changes including medications that would function as mood stabilizers such as Lamictal and Depakote.   Home Medications Prior to Admission medications   Medication Sig Start Date End Date Taking? Authorizing Provider  albuterol  (PROVENTIL  HFA;VENTOLIN  HFA) 108 (90 Base) MCG/ACT inhaler Inhale 2 puffs into the lungs every 2 (two)  hours as needed for wheezing or shortness of breath (cough). 10/17/15   Shirlean Therisa ORN, NP  albuterol  (PROVENTIL ) (2.5 MG/3ML) 0.083% nebulizer solution Take 3 mLs (2.5 mg total) by nebulization every 6 (six) hours as needed for wheezing or shortness of breath. 01/16/16   Penland, Clotilda POUR, MD  aspirin  EC 81 MG tablet Take 1 tablet (81 mg total) by mouth daily. Swallow whole. 05/11/23   Victoria Ruts, MD  atorvastatin  (LIPITOR) 40 MG tablet Take 1 tablet (40 mg total) by mouth daily. 05/10/23 09/09/24  Victoria Ruts, MD  busPIRone  (BUSPAR ) 5 MG tablet Take 1 tablet (5 mg total) by mouth 2 (two) times daily. Patient taking differently: Take 5 mg by mouth daily. 05/10/23   Victoria Ruts, MD  escitalopram  (LEXAPRO ) 20 MG tablet Take 1 tablet (20 mg total) by mouth at bedtime. 05/10/23   Victoria Ruts, MD  fluticasone  (FLONASE ) 50 MCG/ACT nasal spray Place 1 spray into both nostrils daily. 12/06/22 12/06/23  Cyrena Mylar, MD  gabapentin  (NEURONTIN ) 100 MG capsule Take 2 capsules (200 mg total) by mouth at bedtime. 05/10/23   Parmar, Meenakshi, MD  Glycerin, PF, (OPTASE COMFORT DRY EYE) 1 % SOLN Place 1 drop into both eyes daily at 6 (six) AM.    [provider]  hydrOXYzine  (ATARAX ) 25 MG tablet Take 1 tablet (25 mg total) by mouth every 6 (six) hours as needed for anxiety. 05/10/23   Victoria Ruts, MD  latanoprost  (XALATAN ) 0.005 % ophthalmic solution Place 1 drop into the left eye at bedtime. 05/10/23   Victoria Ruts, MD  loratadine (CLARITIN) 10 MG tablet Take 10 mg by mouth daily as needed for allergies.     [provider]  LUMIGAN 0.01 % SOLN Place 1 drop into the right eye at bedtime. 06/24/18   [provider]  meclizine  (ANTIVERT ) 25 MG tablet Take 1 tablet (25 mg total) by mouth 3 (three) times daily as needed for dizziness. Patient not taking: Reported on 08/31/2023 12/06/22   Cyrena Mylar, MD  mirtazapine  (REMERON ) 7.5 MG tablet Take 1 tablet (7.5 mg total)  by mouth at bedtime. 05/10/23 09/09/24  Victoria Ruts, MD  montelukast  (SINGULAIR ) 10 MG tablet Take 1 tablet (10 mg total) by mouth daily. Patient not taking: Reported on 09/10/2023 05/10/23   Parmar, Meenakshi, MD  Naphazoline-Pheniramine (ALLERGY EYE OP) Place 1-2 drops into both eyes 2 (two) times daily as needed (ITCHY, WATERY EYES).    [provider]  pantoprazole  (PROTONIX ) 40 MG tablet Take 1 tablet (40 mg total) by mouth daily before breakfast. Patient not taking: Reported on 08/31/2023 05/11/23   Victoria Ruts, MD  Plecanatide  (TRULANCE ) 3 MG TABS Take 1 tablet by mouth daily. Patient not taking: Reported on 08/31/2023 11/20/21   Therisa Bi, MD  senna-docusate (SENOKOT-S) 8.6-50 MG tablet Take 2 tablets by mouth 2 (two) times daily. Patient not taking: Reported on 08/31/2023 05/10/23   Victoria Ruts, MD  [facility] 1-0.2 % SUSP Apply 1 drop to eye 2 (two) times daily. Patient not taking: Reported on 09/10/2023 04/04/19   [provider]  sodium chloride  (OCEAN) 0.65 %  SOLN nasal spray Place 1 spray into both nostrils as needed for congestion.    [provider]  traZODone  (DESYREL ) 50 MG tablet Take 0.5 tablets (25 mg total) by mouth at bedtime as needed for sleep. 05/10/23   Victoria Ruts, MD      Allergies    Codeine    Review of Systems   Review of Systems  Physical Exam Updated Vital Signs BP (!) 154/72   Pulse 64   Temp 98.7 F (37.1 C) (Oral)   Resp 17   Ht 1.6 m (5' 3)   Wt 50 kg   SpO2 99%   BMI 19.53 kg/m  Physical Exam Vitals reviewed.  HENT:     Head: Normocephalic.     Right Ear: External ear normal.     Nose: Nose normal.     Mouth/Throat:     Pharynx: Oropharynx is clear.  Eyes:     Pupils: Pupils are equal, round, and reactive to light.  Cardiovascular:     Rate and Rhythm: Normal rate.     Pulses: Normal pulses.  Pulmonary:     Effort: Pulmonary effort is normal.     Breath sounds: Normal breath sounds.   Abdominal:     General: Abdomen is flat.     Palpations: Abdomen is soft.  Musculoskeletal:     Cervical back: Normal range of motion.  Skin:    General: Skin is warm.     Capillary Refill: Capillary refill takes less than 2 seconds.  Neurological:     General: No focal deficit present.     Mental Status: She is alert.     Comments: Tremor noted right ue Patient with guttural vocalization   Psychiatric:        Mood and Affect: Mood normal.        Behavior: Behavior normal.     ED Results / Procedures / Treatments   Labs (all labs ordered are listed, but only abnormal results are displayed) Labs Reviewed  COMPREHENSIVE METABOLIC PANEL - Abnormal; Notable for the following components:      Result Value   Glucose, Bld 115 (*)    Total Protein 6.3 (*)    All other components within normal limits  RAPID URINE DRUG SCREEN, HOSP PERFORMED - Abnormal; Notable for the following components:   Benzodiazepines POSITIVE (*)    All other components within normal limits  URINALYSIS, ROUTINE W REFLEX MICROSCOPIC - Abnormal; Notable for the following components:   Hgb urine dipstick SMALL (*)    All other components within normal limits  I-STAT CHEM 8, ED - Abnormal; Notable for the following components:   Glucose, Bld 110 (*)    Calcium , Ion 1.09 (*)    Hemoglobin 11.9 (*)    HCT 35.0 (*)    All other components within normal limits  CBG MONITORING, ED - Abnormal; Notable for the following components:   Glucose-Capillary 108 (*)    All other components within normal limits  ETHANOL  PROTIME-INR  APTT  CBC  DIFFERENTIAL    EKG None  Radiology MR BRAIN WO CONTRAST Result Date: 09/29/2023 CLINICAL DATA:  Neuro deficit, acute, stroke suspected. Left-sided facial droop. EXAM: MRI HEAD WITHOUT CONTRAST TECHNIQUE: Multiplanar, multiecho pulse sequences of the brain and surrounding structures were obtained without intravenous contrast. COMPARISON:  Head CT earlier same day.  MRI  09/10/2023 FINDINGS: Brain: Diffusion imaging does not show any acute or subacute infarction or other cause of restricted diffusion. The brainstem and  cerebellum are normal. Cerebral hemispheres are normal except for a focus of susceptibility artifact at the right frontoparietal vertex that could represent a tiny cavernoma or old microhemorrhage. No evidence of neoplastic mass lesion, acute hemorrhage, hydrocephalus or extra-axial collection. Vascular: Major vessels at the base of the brain show flow. Skull and upper cervical spine: Negative Sinuses/Orbits: Clear/normal Other: None IMPRESSION: No acute or reversible finding. Tiny focus of susceptibility artifact at the right frontoparietal vertex that could represent a tiny cavernoma or old microhemorrhage. Electronically Signed   By: Oneil Officer M.D.   On: 09/29/2023 13:04   CT HEAD CODE STROKE WO CONTRAST Result Date: 09/29/2023 CLINICAL DATA:  Code stroke. Neuro deficit, acute, stroke suspected. Left-sided facial droop. EXAM: CT HEAD WITHOUT CONTRAST TECHNIQUE: Contiguous axial images were obtained from the base of the skull through the vertex without intravenous contrast. RADIATION DOSE REDUCTION: This exam was performed according to the departmental dose-optimization program which includes automated exposure control, adjustment of the mA and/or kV according to patient size and/or use of iterative reconstruction technique. COMPARISON:  MR head without and with contrast 09/10/2023. FINDINGS: Brain: No acute infarct, hemorrhage, or mass lesion is present. No significant white matter lesions are present. Deep brain nuclei are within normal limits. The ventricles are of normal size. No significant extraaxial fluid collection is present. The brainstem and cerebellum are within normal limits. Midline structures are within normal limits. Vascular: No hyperdense vessel or unexpected calcification. Skull: Calvarium is intact. No focal lytic or blastic lesions are  present. No significant extracranial soft tissue lesion is present. Sinuses/Orbits: The paranasal sinuses and mastoid air cells are clear. The globes and orbits are within normal limits. ASPECTS [facility] Stroke Program Early CT Score) - Ganglionic level infarction (caudate, lentiform nuclei, internal capsule, insula, M1-M3 cortex): 7/7 - Supraganglionic infarction (M4-M6 cortex): 3/3 Total score (0-10 with 10 being normal): 10/10 IMPRESSION: 1. Normal head CT. 2. Aspects is 10/10. Electronically Signed   By: Lonni Necessary M.D.   On: 09/29/2023 11:27    Procedures Procedures    Medications Ordered in ED Medications  LORazepam  (ATIVAN ) injection 1 mg (1 mg Intravenous Given 09/29/23 1154)    ED Course/ Medical Decision Making/ A&P Clinical Course as of 09/29/23 1335  Wed Sep 29, 2023  1311 MRI shows no evidence of acute or subacute infarction or other cause of restricted diffusion.  Tiny focus of susceptibility artifact right frontal parietal vertex to could represent a tiny cavernoma or other microhemorrhage [DR]    Clinical Course User Index [DR] Levander Houston, MD                                 Medical Decision Making Amount and/or Complexity of Data Reviewed Labs: ordered. Radiology: ordered.  Risk Prescription drug management.   63 year old female presents today complaining of diffuse body shaking.  She states that she has a lot of stress at home with 5 dogs or left from her husband passing 2 years ago.  She states that she is unable to get in by to help her with them.  She reports that she had a similar episode yesterday and was going to follow-up with her doctor today.  She began having shaking in all of her extremities.  She was awake throughout this.  She denies any fever, chills, headache, head injury, She has had multiple episodes of similar symptoms going back at least through the summer of this  year.  Review of records show 5 prior MRIs dating back to March of this in  2024.  She has not been noted to have an acute process on any of these tests. Patient seen and evaluated by neurology and cleared by neurology CBC complete metabolic panel with no acute findings Patient received Ativan  prior to going to LR I have discussed all findings with the patient.  She has a physician to follow-up with She appears stable for discharge       Final Clinical Impression(s) / ED Diagnoses Final diagnoses:  Episode of shaking    Rx / DC Orders ED Discharge Orders     None         Levander Houston, MD 09/29/23 1335

## 2023-09-29 NOTE — Code Documentation (Signed)
 Julia Osborne is a 63 yr old female with a PMH of conversion disorder, cancer, hyperlipidemia arriving to [facility] via [facility] on 09/29/2023. She is coming from home where she was LKW by her son at 0700. She is now c/o seizures and weakness and dysarthria. She is not on any known blood thinners.     Pt met at bridge by Stroke Team. Labs, CBG obtained, airway cleared by EDP. Pt to CT with team. NIHSS 6. Pt with fluctuating and inconsistent symptoms of weakness and dysarthria. Weakness is overcome with noxious stimuli. Please see documentation for NIHSS details and timeline. The following imaging was obtained: CT. Per Dr. Voncile, CT is negative for hemorrhage.     Pt returned to ED room Trauma B where her workup will continue. (Real time was 1130, so she is OOW for thrombolytics), so pt will need q 2 hr VS and NIHSS for 12 hours, then q 4. Pt is ineligible for endovascular therapy as no LVO symptoms, stroke not suspected. Bedside handoff with Carlyon RN complete.

## 2023-10-02 ENCOUNTER — Emergency Department
Admission: EM | Admit: 2023-10-02 | Discharge: 2023-10-03 | Disposition: A | Discharge status: Home / Self Care | Payer: Medicaid Other | Attending: Emergency Medicine | Admitting: Emergency Medicine

## 2023-10-02 DIAGNOSIS — I10 Essential (primary) hypertension: Secondary | ICD-10-CM | POA: Diagnosis not present

## 2023-10-02 DIAGNOSIS — F444 Conversion disorder with motor symptom or deficit: Secondary | ICD-10-CM | POA: Diagnosis not present

## 2023-10-02 DIAGNOSIS — R251 Tremor, unspecified: Secondary | ICD-10-CM

## 2023-10-02 LAB — CBC WITH DIFFERENTIAL/PLATELET
Abs Immature Granulocytes: 0.02 10*3/uL (ref 0.00–0.07)
Basophils Absolute: 0 10*3/uL (ref 0.0–0.1)
Basophils Relative: 0 %
Eosinophils Absolute: 0.1 10*3/uL (ref 0.0–0.5)
Eosinophils Relative: 1 %
HCT: 36.6 % (ref 36.0–46.0)
Hemoglobin: 12.3 g/dL (ref 12.0–15.0)
Immature Granulocytes: 0 %
Lymphocytes Relative: 29 %
Lymphs Abs: 2.4 10*3/uL (ref 0.7–4.0)
MCH: 30.8 pg (ref 26.0–34.0)
MCHC: 33.6 g/dL (ref 30.0–36.0)
MCV: 91.7 fL (ref 80.0–100.0)
Monocytes Absolute: 0.6 10*3/uL (ref 0.1–1.0)
Monocytes Relative: 7 %
Neutro Abs: 5 10*3/uL (ref 1.7–7.7)
Neutrophils Relative %: 63 %
Platelets: 252 10*3/uL (ref 150–400)
RBC: 3.99 MIL/uL (ref 3.87–5.11)
RDW: 13.1 % (ref 11.5–15.5)
WBC: 8 10*3/uL (ref 4.0–10.5)
nRBC: 0 % (ref 0.0–0.2)

## 2023-10-02 LAB — BASIC METABOLIC PANEL
Anion gap: 9 (ref 5–15)
BUN: 16 mg/dL (ref 6–20)
CO2: 25 mmol/L (ref 22–32)
Calcium: 8.9 mg/dL (ref 8.9–10.3)
Chloride: 103 mmol/L (ref 98–111)
Creatinine, Ser: 0.7 mg/dL (ref 0.44–1.00)
GFR, Estimated: >60 mL/min (ref >60)
Glucose, Bld: 76 mg/dL (ref 70–99)
Potassium: 3.6 mmol/L (ref 3.5–5.1)
Sodium: 137 mmol/L (ref 135–145)

## 2023-10-02 MED ORDER — IBUPROFEN 600 MG PO TABS
600.0000 mg | ORAL_TABLET | Freq: Three times a day (TID) | ORAL | Status: DC | PRN
  Filled 2023-10-02: qty 1

## 2023-10-02 MED ORDER — HYDROXYZINE HCL 25 MG PO TABS: 25.0000 mg | ORAL_TABLET | Freq: Four times a day (QID) | ORAL | Status: DC | PRN

## 2023-10-02 MED ORDER — BUSPIRONE HCL 5 MG PO TABS: 5.0000 mg | ORAL_TABLET | Freq: Three times a day (TID) | ORAL | 0 refills | Status: DC

## 2023-10-02 MED ORDER — ALUM & MAG HYDROXIDE-SIMETH 200-200-20 MG/5ML PO SUSP
30.0000 mL | Freq: Four times a day (QID) | ORAL | Status: DC | PRN
  Filled 2023-10-02: qty 30

## 2023-10-02 MED ORDER — LORAZEPAM 1 MG PO TABS
0.5000 mg | ORAL_TABLET | Freq: Once | ORAL | Status: AC
  Administered 2023-10-02: 0.5 mg via ORAL
  Filled 2023-10-02: qty 1

## 2023-10-02 MED ORDER — ASPIRIN 81 MG PO TBEC: 81.0000 mg | DELAYED_RELEASE_TABLET | Freq: Every day | ORAL | Status: DC

## 2023-10-02 MED ORDER — BUSPIRONE HCL 5 MG PO TABS
5.0000 mg | ORAL_TABLET | Freq: Three times a day (TID) | ORAL | Status: DC
Start: 2023-10-02 — End: 2023-10-03
  Administered 2023-10-03: 5 mg via ORAL
  Filled 2023-10-02: qty 1

## 2023-10-02 MED ORDER — BUSPIRONE HCL 5 MG PO TABS
5.0000 mg | ORAL_TABLET | ORAL | Status: AC
  Administered 2023-10-02: 5 mg via ORAL
  Filled 2023-10-02: qty 1

## 2023-10-02 MED ORDER — POLYVINYL ALCOHOL 1.4 % OP SOLN
1.0000 [drp] | Freq: Every day | OPHTHALMIC | Status: DC
  Filled 2023-10-02: qty 15

## 2023-10-02 MED ORDER — SENNOSIDES-DOCUSATE SODIUM 8.6-50 MG PO TABS
2.0000 | ORAL_TABLET | Freq: Two times a day (BID) | ORAL | Status: DC
  Administered 2023-10-03: 2 via ORAL
  Filled 2023-10-02: qty 2

## 2023-10-02 MED ORDER — ESCITALOPRAM OXALATE 10 MG PO TABS
20.0000 mg | ORAL_TABLET | Freq: Every day | ORAL | Status: DC
  Administered 2023-10-03: 20 mg via ORAL
  Filled 2023-10-02: qty 2

## 2023-10-02 MED ORDER — ONDANSETRON HCL 4 MG PO TABS
4.0000 mg | ORAL_TABLET | Freq: Three times a day (TID) | ORAL | Status: DC | PRN
  Filled 2023-10-02: qty 1

## 2023-10-02 MED ORDER — GABAPENTIN 100 MG PO CAPS
200.0000 mg | ORAL_CAPSULE | Freq: Every day | ORAL | Status: DC
  Administered 2023-10-03: 200 mg via ORAL
  Filled 2023-10-02: qty 2

## 2023-10-02 MED ORDER — LATANOPROST 0.005 % OP SOLN
1.0000 [drp] | Freq: Every day | OPHTHALMIC | Status: DC
  Filled 2023-10-02: qty 2.5

## 2023-10-02 MED ORDER — TRAZODONE HCL 50 MG PO TABS
25.0000 mg | ORAL_TABLET | Freq: Every evening | ORAL | Status: DC | PRN
  Administered 2023-10-03: 25 mg via ORAL
  Filled 2023-10-02: qty 1

## 2023-10-02 MED ORDER — MIRTAZAPINE 15 MG PO TABS
7.5000 mg | ORAL_TABLET | Freq: Every day | ORAL | Status: DC
  Administered 2023-10-03: 7.5 mg via ORAL
  Filled 2023-10-02: qty 1

## 2023-10-02 NOTE — Discharge Instructions (Addendum)
 Cut back on caffeine sources such as coffee and soda as much as possible.  Cutting back on smoking will also be very positive for your health. Be sure to follow up with the psychiatry office at your appointment on January 16 at 2:00pm.  You can discuss medication side effects and priorities with them.

## 2023-10-02 NOTE — ED Provider Triage Note (Signed)
 Emergency Medicine Provider Triage Evaluation Note  Julia Osborne , a 63 y.o. female  was evaluated in triage.  Pt complains of tremors and 2 falls today and worsening tremors. Has been given ativan  in the past with relief of tremors, but was not prescribed any for home.   Physical Exam  BP 124/69 (BP Location: Right Arm)   Pulse 64   Temp 98.1 F (36.7 C)   Resp 18   Ht 5' 3 (1.6 m)   Wt 50 kg   SpO2 96%   BMI 19.53 kg/m  Gen:   Awake, no distress   Resp:  Normal effort  MSK:   Moves extremities without difficulty  Other:    Medical Decision Making  Medically screening exam initiated at 1:34 PM.  Appropriate orders placed.  Julia Osborne was informed that the remainder of the evaluation will be completed by another provider, this initial triage assessment does not replace that evaluation, and the importance of remaining in the ED until their evaluation is complete.     Julia Kirk NOVAK, FNP 10/02/23 1511

## 2023-10-02 NOTE — ED Triage Notes (Signed)
 PT BIBA from home. Pt reports intermittent tremors since stroke in August. Pt reports being seen at [facility] and was given ativan  with relief. Pt states that they didn't send her home with any ativan  and the tremors has been getting worse over the last couple of days. Pt reports a fall without injury this morning.

## 2023-10-02 NOTE — ED Triage Notes (Signed)
 First Nurse note:  Pt, from home, c/o intermittent, generalized tremors since having a stroke in August.  Pt reports she has been seen for same and was given Ativan  w/ relief.  Pt reports she fell this morning d/t tremor, but denies injury.     No new stroke symptoms.     HR 66 BP 126/88 O2 100%  CBG 112

## 2023-10-02 NOTE — ED Notes (Signed)
 Patient is tolerating dinner meal from cafeteria without difficulty.

## 2023-10-02 NOTE — ED Notes (Signed)
 Patient states she doesn't have a ride home and her son cannot pick her up. No cab vouchers are available. Dr. Scotty Court aware.

## 2023-10-02 NOTE — ED Provider Notes (Addendum)
 [facility] Provider Note    Event Date/Time   First MD Initiated Contact with Patient 10/02/23 1533     (approximate)   History   Chief Complaint: Tremors   HPI  Julia Osborne is a 63 y.o. female who comes to the ED due to tremors.  She reports that because of her shaking episodes she is unable to keep her balance at home and has frequent falls.  She fell this morning.  No head injury or loss of consciousness, denies pain.  Denies SI HI or auditory/visual hallucinations.  Does not drink, no habitual sedative use.  Reviewed outside records noting that she had MRI September 29, 2023 of the brain which was unremarkable.  Further history obtained from the patient's son who  does believe that she is compliant with her medications.  States that she is usually fine in the morning, but then after she starts drinking [employer], coffee, and smoking cigarettes, she develops these shaking symptoms.    09/29/23 neuro eval: No need for inpatient workup at this time Can follow-up with outpatient neurology. Although it has been recommended multiple times, she has not received any psychiatry referrals or follow-ups.  I strongly recommend a psychiatry follow-up for evaluation given multiple psychogenic episodes that lead to emergent evaluations without much help for her.   09/10/2023 psychiatry recs: ## Psychiatric Medication Recommendations:  Continue Lexapro  20mg  PO Q HS Continue Hydroxyzine  25mg  PO Q 6 hours PRN anxiety Increase Gabapentin  200mg  PO BID      Physical Exam   Triage Vital Signs: ED Triage Vitals  Encounter Vitals Group     BP 10/02/23 1315 124/69     Systolic BP Percentile --      Diastolic BP Percentile --      Pulse Rate 10/02/23 1315 64     Resp 10/02/23 1315 18     Temp 10/02/23 1315 98.1 F (36.7 C)     Temp src --      SpO2 10/02/23 1311 100 %     Weight 10/02/23 1316 110 lb 3.7 oz (50 kg)     Height 10/02/23 1316 5' 3 (1.6 m)      Head Circumference --      Peak Flow --      Pain Score 10/02/23 1316 5     Pain Loc --      Pain Education --      Exclude from Growth Chart --     Most recent vital signs: Vitals:   10/02/23 1311 10/02/23 1315  BP:  124/69  Pulse:  64  Resp:  18  Temp:  98.1 F (36.7 C)  SpO2: 100% 96%    General: Awake, no distress.  CV:  Good peripheral perfusion.  Regular rate rhythm Resp:  Normal effort.  Clear to auscultation bilaterally Abd:  No distention.  Soft nontender Other:  Cranial nerves III through XII intact.  No motor drift, normal cerebellar function, normal patellar reflexes.  No rigidity, normal tone.  Normal speech and language.  Depressed affect   ED Results / Procedures / Treatments   Labs (all labs ordered are listed, but only abnormal results are displayed) Labs Reviewed  CBC WITH DIFFERENTIAL/PLATELET  BASIC METABOLIC PANEL  URINALYSIS, ROUTINE W REFLEX MICROSCOPIC     EKG    RADIOLOGY    PROCEDURES:  Procedures   MEDICATIONS ORDERED IN ED: Medications  ibuprofen  (ADVIL ) tablet 600 mg (has no administration in time range)  ondansetron  (  ZOFRAN ) tablet 4 mg (has no administration in time range)  alum & mag hydroxide-simeth (MAALOX/MYLANTA) 200-200-20 MG/5ML suspension 30 mL (has no administration in time range)  aspirin  EC tablet 81 mg (has no administration in time range)  busPIRone  (BUSPAR ) tablet 5 mg (has no administration in time range)  escitalopram  (LEXAPRO ) tablet 20 mg (has no administration in time range)  gabapentin  (NEURONTIN ) capsule 200 mg (has no administration in time range)  Glycerin (PF) 1 % SOLN 1 drop (has no administration in time range)  hydrOXYzine  (ATARAX ) tablet 25 mg (has no administration in time range)  latanoprost  (XALATAN ) 0.005 % ophthalmic solution 1 drop (has no administration in time range)  mirtazapine  (REMERON ) tablet 7.5 mg (has no administration in time range)  senna-docusate (Senokot-S) tablet 2 tablet (has  no administration in time range)  traZODone  (DESYREL ) tablet 25 mg (has no administration in time range)  LORazepam  (ATIVAN ) tablet 0.5 mg (0.5 mg Oral Given 10/02/23 1610)  busPIRone  (BUSPAR ) tablet 5 mg (5 mg Oral Given 10/02/23 1813)     IMPRESSION / MDM / ASSESSMENT AND PLAN / ED COURSE  I reviewed the triage vital signs and the nursing notes.  DDx: Depression, anxiety, conversion disorder, factitious disorder, electrolyte derangement  Patient's presentation is most consistent with acute presentation with potential threat to life or bodily function.  Patient presents with tremor.  Previous extensive evaluations are negative for structural abnormalities of the brain, currently neurologic exam is reassuring.  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  I discussed with her son as well who notes that the persistence and severity of her symptoms are making it difficult for her to function at home, he is frequently having to pick her up off the floor or call EMS.     Patient does not meet commitment criteria right now.  Will request psychiatry evaluation for further recommendations.  The patient has been placed in psychiatric observation due to the need to provide a safe environment for the patient while obtaining psychiatric consultation and evaluation, as well as ongoing medical and medication management to treat the patient's condition.  The patient has not been placed under full IVC at this time.   Clinical Course as of 10/02/23 2303  Sat Oct 02, 2023  1809 Pt is eating comfortably, holding plate in left hand and using fork in right hand. Demonstrates great reaction and coordination when she caught a falling napkin.  [PS]  1810 symptoms discussed with psychiatry who recommends restarting BuSpar  at 5 mg 3 times daily and minimizing stimulant use.  Counseled patient on this, cutting back on her coffee and  [facility] and smoking.  Also [PS]  1811  emphasized need for outpatient follow-up, patient has an appointment with outpatient psych in 5 days. [PS]    Clinical Course User Index [PS] Viviann Pastor, MD    ----------------------------------------- 6:15 PM on 10/02/2023 ----------------------------------------- Chart review finds patient has an appointment with psychiatry in 5 days.  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   Viviann Pastor, MD 10/02/23 1815    Viviann Pastor, MD 10/02/23 2303

## 2023-10-03 DIAGNOSIS — F444 Conversion disorder with motor symptom or deficit: Secondary | ICD-10-CM | POA: Diagnosis not present

## 2023-10-03 NOTE — Progress Notes (Deleted)
Psychiatric Initial Adult Assessment   Patient Identification: Julia Osborne MRN:  [id number] Date of Evaluation:  10/03/2023 Referral Source: *** Chief Complaint:  No chief complaint on file.  Visit Diagnosis: No diagnosis found.  History of Present Illness:   Julia Osborne is a 63 y.o. year old female with a history of PTSD depression, anxiety, hypertension, COPD, chronic pain, lynch syndrome s/p colectomy, endometrial cancer s/p radical hysterectomy, chemotherapy, , vitamin D deficiency, who is referred for depression .  Per  chart review, she was admitted in August 2024 for speech changes, altered mental status. EEG, MRI without significant abnormality, and patient was admitted to [facility] subseqeutnly for SI.   Per chart review, she was admitted 1/2-4 at [facility] for # Global neurologic disorder of unclear etiology,  # Concern for stroke, less likely given multiple negative workup, # Suspected underlying psychiatric disorder, # Ambulatory dysfunction. Neurology was consulted, andno further work up for [employer].    She was seen at ED a few times in 2025 due to code stroke,  tremors, intermittent left facial weakness, slurred speech, frequent fall Jan 2025. Neurology was consulted "Exam has a lot of nonorganic findings.  Stat MRI was done which is negative for stroke. She has been evaluated for "seizure-like activity" at outside hospitals as well as I have witnessed events in the hospital which are consistent with functional neurological disorder. PEx- Cranial nerves II to XII: Pupils equal round react light, extraocular movements intact, visual fields full, face appears grossly symmetric although she has volitional weakness in smiling completely but there is no focality in this exam, tongue and palate midline. Motor examination with no drift in any of the 4 extremities although she required a lot of coaching to keep the hands up for 10 seconds and legs up for 5 seconds without drift." She will  have outpatient follow up.   [place]  Lexapro 20 mg daily, gabapentin uptitrated to 200 mg twice a day, hydroxyzine 25 mg q6hprn for anxiety ? buspar    Associated Signs/Symptoms: Depression Symptoms:  {DEPRESSION SYMPTOMS:20000} (Hypo) Manic Symptoms:  {BHH MANIC SYMPTOMS:22872} Anxiety Symptoms:  {BHH ANXIETY SYMPTOMS:22873} Psychotic Symptoms:  {BHH PSYCHOTIC SYMPTOMS:22874} PTSD Symptoms: {BHH PTSD SYMPTOMS:22875}  Past Psychiatric History:  Outpatient:  Psychiatry admission:  Previous suicide attempt:  Past trials of medication:  History of violence:  History of head injury:   Previous Psychotropic Medications: {YES/NO:21197}  Substance Abuse History in the last 12 months:  {yes no:314532}  Consequences of Substance Abuse: {BHH CONSEQUENCES OF SUBSTANCE ABUSE:22880}  Past Medical History:  Past Medical History:  Diagnosis Date   Abdominal pain, acute, left upper quadrant    Abdominal pain, epigastric    Abnormal CT scan, sigmoid colon    Abnormal vaginal Pap smear    Adenocarcinoma of colon (HCC) 09/13/2015   Partial colon resection and chemo tx's.    Anemia    Anxiety    Anxiety disorder, unspecified    Aphasia    Asthma    Cancer (HCC)    endometrial; cancer cells in intestine   Chronic pain of left knee    Colon neoplasm    Constipation    Conversion reaction    COPD (chronic obstructive pulmonary disease) (HCC)    no definite diagnosis   Depression    Diverticulitis    Diverticulosis of colon without hemorrhage    Dyspareunia 05/16/2015   Dysphasia    Endometrial cancer [facility])    Esophageal dysphagia    Family  history of cancer    Family history of kidney cancer    Ganglion of left knee    Gastric intestinal metaplasia    GERD (gastroesophageal reflux disease)    Glaucoma    Hematochezia    History of colonic polyps    Hyperlipidemia    Indigestion    Lynch syndrome    Mucosal abnormality of stomach    Nausea and vomiting     Neuropathy    feet and hands   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 drug (HCC)    PONV (postoperative nausea and vomiting)    Pre-diabetes    Primary osteoarthritis of left knee    Rectal bleeding    Reflux esophagitis    Soft tissue swelling of knee joint    Uterine fibroid    UTI (urinary tract infection) 04/26/2023   on cipro   Vaginal dryness 05/16/2015   Vaginal itching    Vaginal Pap smear, abnormal     Past Surgical History:  Procedure Laterality Date   ABDOMINAL HYSTERECTOMY     APPENDECTOMY     BIOPSY N/A 03/14/2015   Procedure: BIOPSY;  Surgeon: Corbin Ade, MD;  Location: [facility];  Service: Endoscopy;  Laterality: N/A;  Gastric   COLONOSCOPY N/A 01/28/2017   Procedure: COLONOSCOPY;  Surgeon: Corbin Ade, MD;  Location: [facility];  Service: Endoscopy;  Laterality: N/A;  11:30am   COLONOSCOPY WITH PROPOFOL N/A 03/14/2015   RMR: Internal hemorrhoids. colonic diverticulosis. Incomplete examination. Prepartation inadequate.   COLONOSCOPY WITH PROPOFOL N/A 07/04/2015   RMR: Colonic diverticulosis . Large polypoid lesion in the vicinity of the hepatic flexure status post saline-assisted piecmeal snare polypectomy  with ablation and tattooing as described. Sigmoid polyp removed as described above. sigmoid colon polyp hyperplastic, hepatic flexure polyp with TA with focal high grade dysplasia    COLONOSCOPY WITH PROPOFOL N/A 11/03/2018   Procedure: COLONOSCOPY WITH PROPOFOL;  Surgeon: Wyline Mood, MD;  Location[facility] ENDOSCOPY;  Service: Gastroenterology;  Laterality: N/A;   COLONOSCOPY WITH PROPOFOL N/A 12/09/2020   Procedure: COLONOSCOPY WITH PROPOFOL;  Surgeon: Wyline Mood, MD;  Location: [facility] ENDOSCOPY;  Service: Gastroenterology;  Laterality: N/A;  COVID POSITIVE 10/30/2020   COLONOSCOPY WITH PROPOFOL N/A 01/20/2021   Procedure: COLONOSCOPY WITH PROPOFOL;  Surgeon: Wyline Mood, MD;  Location: [facility] ENDOSCOPY;  Service: Gastroenterology;  Laterality: N/A;   COLONOSCOPY  WITH PROPOFOL N/A 12/10/2021   Procedure: COLONOSCOPY WITH PROPOFOL;  Surgeon: Wyline Mood, MD;  Location: [facility] ENDOSCOPY;  Service: Gastroenterology;  Laterality: N/A;   COLONOSCOPY WITH PROPOFOL N/A 02/16/2023   Procedure: COLONOSCOPY WITH PROPOFOL;  Surgeon: Wyline Mood, MD;  Location: [facility] ENDOSCOPY;  Service: Gastroenterology;  Laterality: N/A;   ENTEROSCOPY N/A 02/12/2020   Procedure: ENTEROSCOPY;  Surgeon: Wyline Mood, MD;  Location: [facility] ENDOSCOPY;  Service: Gastroenterology;  Laterality: N/A;   ESOPHAGEAL DILATION N/A 03/14/2015   Procedure: ESOPHAGEAL DILATION;  Surgeon: Corbin Ade, MD;  Location: [facility];  Service: Endoscopy;  Laterality: N/AElease Hashimoto 54   ESOPHAGOGASTRODUODENOSCOPY N/A 05/19/2016   Procedure: ESOPHAGOGASTRODUODENOSCOPY (EGD);  Surgeon: Corbin Ade, MD;  [facility];  Service: Endoscopy;  Laterality: N/A;  215   ESOPHAGOGASTRODUODENOSCOPY N/A 01/28/2017   Procedure: ESOPHAGOGASTRODUODENOSCOPY (EGD);  Surgeon: Corbin Ade, MD;  [facility];  Service: Endoscopy;  Laterality: N/A;   ESOPHAGOGASTRODUODENOSCOPY N/A 12/10/2021   Procedure: ESOPHAGOGASTRODUODENOSCOPY (EGD);  Surgeon: Wyline Mood, MD;  Location: [facility] ENDOSCOPY;  Service: Gastroenterology;  Laterality: N/A;   ESOPHAGOGASTRODUODENOSCOPY (EGD) WITH  PROPOFOL N/A 03/14/2015   RMR: Mild erosive reflux esophagitis status post passage o f a Maloney dilator. Abnormal gastric mucosa of uncertain significance as described above. status post biopsy, benign   ESOPHAGOGASTRODUODENOSCOPY (EGD) WITH PROPOFOL N/A 11/18/2017   Procedure: ESOPHAGOGASTRODUODENOSCOPY (EGD) WITH PROPOFOL;  Surgeon: Corbin Ade, MD;  [facility];  Service: Endoscopy;  Laterality: N/A;  12:15pm   ESOPHAGOGASTRODUODENOSCOPY (EGD) WITH PROPOFOL N/A 11/03/2018   Procedure: ESOPHAGOGASTRODUODENOSCOPY (EGD) WITH PROPOFOL;  Surgeon: Wyline Mood, MD;  Location: [facility] ENDOSCOPY;  Service: Gastroenterology;   Laterality: N/A;   ESOPHAGOGASTRODUODENOSCOPY (EGD) WITH PROPOFOL N/A 02/16/2023   Procedure: ESOPHAGOGASTRODUODENOSCOPY (EGD) WITH PROPOFOL;  Surgeon: Wyline Mood, MD;  Location: [facility] ENDOSCOPY;  Service: Gastroenterology;  Laterality: N/A;   GIVENS CAPSULE STUDY N/A 12/25/2019   Procedure: GIVENS CAPSULE STUDY;  Surgeon: Wyline Mood, MD;  Location: [facility] ENDOSCOPY;  Service: Gastroenterology;  Laterality: N/A;   MALONEY DILATION N/A 05/19/2016   Procedure: [name] DILATION;  Surgeon: Corbin Ade, MD;  [facility];  Service: Endoscopy;  Laterality: N/A;   MALONEY DILATION N/A 01/28/2017   Procedure: [name] DILATION;  Surgeon: Corbin Ade, MD;  [facility];  Service: Endoscopy;  Laterality: N/A;   MALONEY DILATION N/A 11/18/2017   Procedure: [name] DILATION;  Surgeon: Corbin Ade, MD;  [facility];  Service: Endoscopy;  Laterality: N/A;   PARTIAL COLECTOMY  08/30/2015   polyp with adenocarcinoma   PARTIAL COLECTOMY  08/29/2018   POLYPECTOMY N/A 07/04/2015   Procedure: POLYPECTOMY;  Surgeon: Corbin Ade, MD;  Location: [facility];  Service: Endoscopy;  Laterality: N/A;   PORTACATH PLACEMENT Right 09/21/2014   TUBAL LIGATION      Family Psychiatric History: ***  Family History:  Family History  Problem Relation Age of Onset   Other Mother        clot that went to heart, deceased age 26ss   Heart attack Father        age 61s, deceased   Stroke Father    Cancer Father        "at death determined he was ate up with cancer"   Hypertension Sister    Kidney cancer Sister 58   Diabetes Brother    Hypertension Brother    Endometriosis Daughter    Heart attack Maternal Grandfather    Diabetes Sister    Brain cancer Paternal Aunt    Cancer Paternal Uncle        NOS   Cancer Paternal Uncle        NOS   Cancer Paternal Uncle        NOS   Colon cancer Neg Hx     Social History:   Social History   Socioeconomic History   Marital  status: Widowed    Spouse name: Not on file   Number of children: 2   Years of education: Not on file   Highest education level: Not on file  Occupational History   Not on file  Tobacco Use   Smoking status: Never   Smokeless tobacco: Never  Vaping Use   Vaping status: Never Used  Substance and Sexual Activity   Alcohol use: No   Drug use: No   Sexual activity: Not Currently    Birth control/protection: Surgical    Comment: hyst  Other Topics Concern   Not on file  Social History Narrative   ** Merged History Encounter **       ** Data  from: 04/28/23 Enc Dept: [facility]-PRE ADM TESTING   #  [place]; smoking- 1/2 ppd; no alcohol; used to run machines;   FHx-Dad- MI; ? Cancer on autopsy; sisters/aunts- brain; lung cancer x2; oldest sister- kidney cancer  Pt has 29 year old son;daughter 33 years. Brothers- states to have s   poken to her family re: importance of them being checked for lynch.        ** Data from: 05/04/23 Enc Dept: [facility]   Lives alone with 4 large dogs.    Social Drivers of Health   Financial Resource Strain: Medium Risk (12/07/2022)   Overall Financial Resource Strain (CARDIA)    Difficulty of Paying Living Expenses: Somewhat hard  Food Insecurity: Medium Risk (09/23/2023)   Received from [facility]   Hunger Vital Sign    Worried About Running Out of Food in the Last Year: Sometimes true    Ran Out of Food in the Last Year: Sometimes true  Transportation Needs: Unmet Transportation Needs (09/23/2023)   Received from [facility]    In the past 12 months, has lack of reliable transportation kept you from medical appointments, meetings, work or from getting things needed for daily living? : Yes  Physical Activity: Inactive (12/07/2022)   Exercise Vital Sign    Days of Exercise per Week: 0 days    Minutes of Exercise per Session: 0 min  Stress: Stress Concern Present (12/07/2022)   [employer] of Occupational Health -  Occupational Stress Questionnaire    Feeling of Stress : To some extent  Social Connections: Socially Isolated (12/07/2022)   Social Connection and Isolation Panel [NHANES]    Frequency of Communication with Friends and Family: Once a week    Frequency of Social Gatherings with Friends and Family: Never    Attends Religious Services: Never    Database administrator or Organizations: No    Attends Banker Meetings: Never    Marital Status: Widowed    Additional Social History: ***  Allergies:   Allergies  Allergen Reactions   Codeine Nausea And Vomiting and Rash    Metabolic Disorder Labs: Lab Results  Component Value Date   HGBA1C 5.9 (H) 05/03/2023   MPG 123 05/03/2023   No results found for: "PROLACTIN" Lab Results  Component Value Date   CHOL 149 05/03/2023   TRIG 66 05/03/2023   HDL 49 05/03/2023   CHOLHDL 3.0 05/03/2023   VLDL 13 05/03/2023   LDLCALC 87 05/03/2023   Lab Results  Component Value Date   TSH 1.194 05/02/2023    Therapeutic Level Labs: No results found for: "LITHIUM" No results found for: "CBMZ" No results found for: "VALPROATE"  Current Medications: Current Outpatient Medications  Medication Sig Dispense Refill   albuterol (PROVENTIL HFA;VENTOLIN HFA) 108 (90 Base) MCG/ACT inhaler Inhale 2 puffs into the lungs every 2 (two) hours as needed for wheezing or shortness of breath (cough). 1 Inhaler 3   albuterol (PROVENTIL) (2.5 MG/3ML) 0.083% nebulizer solution Take 3 mLs (2.5 mg total) by nebulization every 6 (six) hours as needed for wheezing or shortness of breath. 75 mL 12   aspirin EC 81 MG tablet Take 1 tablet (81 mg total) by mouth daily. Swallow whole. 30 tablet 12   atorvastatin (LIPITOR) 40 MG tablet Take 1 tablet (40 mg total) by mouth daily. 30 tablet 0   busPIRone (BUSPAR) 5 MG tablet Take 1 tablet (5 mg total) by mouth 3 (three) times daily. 60  tablet 0   escitalopram (LEXAPRO) 20 MG tablet Take 1 tablet (20 mg total) by  mouth at bedtime. 30 tablet 0   fluticasone (FLONASE) 50 MCG/ACT nasal spray Place 1 spray into both nostrils daily. 1 g 0   gabapentin (NEURONTIN) 100 MG capsule Take 2 capsules (200 mg total) by mouth at bedtime. 60 capsule 0   Glycerin, PF, (OPTASE COMFORT DRY EYE) 1 % SOLN Place 1 drop into both eyes daily at 6 (six) AM.     hydrOXYzine (ATARAX) 25 MG tablet Take 1 tablet (25 mg total) by mouth every 6 (six) hours as needed for anxiety. 30 tablet 0   latanoprost (XALATAN) 0.005 % ophthalmic solution Place 1 drop into the left eye at bedtime. 2.5 mL 12   loratadine (CLARITIN) 10 MG tablet Take 10 mg by mouth daily as needed for allergies.      LUMIGAN 0.01 % SOLN Place 1 drop into the right eye at bedtime.     meclizine (ANTIVERT) 25 MG tablet Take 1 tablet (25 mg total) by mouth 3 (three) times daily as needed for dizziness. (Patient not taking: Reported on 08/31/2023) 30 tablet 0   mirtazapine (REMERON) 7.5 MG tablet Take 1 tablet (7.5 mg total) by mouth at bedtime. 30 tablet 0   montelukast (SINGULAIR) 10 MG tablet Take 1 tablet (10 mg total) by mouth daily. (Patient not taking: Reported on 09/10/2023) 30 tablet 0   Naphazoline-Pheniramine (ALLERGY EYE OP) Place 1-2 drops into both eyes 2 (two) times daily as needed (ITCHY, WATERY EYES).     pantoprazole (PROTONIX) 40 MG tablet Take 1 tablet (40 mg total) by mouth daily before breakfast. (Patient not taking: Reported on 08/31/2023) 15 tablet 0   Plecanatide (TRULANCE) 3 MG TABS Take 1 tablet by mouth daily. (Patient not taking: Reported on 08/31/2023) 30 tablet 6   senna-docusate (SENOKOT-S) 8.6-50 MG tablet Take 2 tablets by mouth 2 (two) times daily. (Patient not taking: Reported on 08/31/2023) 30 tablet 0   SIMBRINZA 1-0.2 % SUSP Apply 1 drop to eye 2 (two) times daily. (Patient not taking: Reported on 09/10/2023)     sodium chloride (OCEAN) 0.65 % SOLN nasal spray Place 1 spray into both nostrils as needed for congestion.     traZODone  (DESYREL) 50 MG tablet Take 0.5 tablets (25 mg total) by mouth at bedtime as needed for sleep. 30 tablet 0   No current facility-administered medications for this visit.   Facility-Administered Medications Ordered in Other Visits  Medication Dose Route Frequency Provider Last Rate Last Admin   alum & mag hydroxide-simeth (MAALOX/MYLANTA) 200-200-20 MG/5ML suspension 30 mL  30 mL Oral Q6H PRN Sharman Cheek, MD       aspirin EC tablet 81 mg  81 mg Oral Daily Sharman Cheek, MD       busPIRone (BUSPAR) tablet 5 mg  5 mg Oral TID Sharman Cheek, MD   5 mg at 10/03/23 0007   escitalopram (LEXAPRO) tablet 20 mg  20 mg Oral QHS Sharman Cheek, MD   20 mg at 10/03/23 0007   gabapentin (NEURONTIN) capsule 200 mg  200 mg Oral QHS Sharman Cheek, MD   200 mg at 10/03/23 0006   hydrOXYzine (ATARAX) tablet 25 mg  25 mg Oral Q6H PRN Sharman Cheek, MD       ibuprofen (ADVIL) tablet 600 mg  600 mg Oral Q8H PRN Sharman Cheek, MD       latanoprost (XALATAN) 0.005 % ophthalmic solution 1 drop  1 drop Left Eye QHS Sharman Cheek, MD       mirtazapine (REMERON) tablet 7.5 mg  7.5 mg Oral QHS Sharman Cheek, MD   7.5 mg at 10/03/23 0006   ondansetron (ZOFRAN) tablet 4 mg  4 mg Oral Q8H PRN Sharman Cheek, MD       polyvinyl alcohol (LIQUIFILM TEARS) 1.4 % ophthalmic solution 1 drop  1 drop Both Eyes Daily Sharman Cheek, MD       senna-docusate (Senokot-S) tablet 2 tablet  2 tablet Oral BID Sharman Cheek, MD   2 tablet at 10/03/23 0007   traZODone (DESYREL) tablet 25 mg  25 mg Oral QHS PRN Sharman Cheek, MD   25 mg at 10/03/23 0012    Musculoskeletal: Strength & Muscle Tone: within normal limits Gait & Station: normal Patient leans: N/A  Psychiatric Specialty Exam: Review of Systems  There were no vitals taken for this visit.There is no height or weight on file to calculate BMI.  General Appearance: {Appearance:22683}  Eye Contact:  {BHH EYE CONTACT:22684}   Speech:  Clear and Coherent  Volume:  Normal  Mood:  {BHH MOOD:22306}  Affect:  {Affect (PAA):22687}  Thought Process:  Coherent  Orientation:  Full (Time, Place, and Person)  Thought Content:  Logical  Suicidal Thoughts:  {ST/HT (PAA):22692}  Homicidal Thoughts:  {ST/HT (PAA):22692}  Memory:  Immediate;   Good  Judgement:  {Judgement (PAA):22694}  Insight:  {Insight (PAA):22695}  Psychomotor Activity:  Normal  Concentration:  Concentration: Good and Attention Span: Good  Recall:  Good  Fund of Knowledge:Good  Language: Good  Akathisia:  No  Handed:  Right  AIMS (if indicated):  not done  Assets:  Communication Skills Desire for Improvement  ADL's:  Intact  Cognition: WNL  Sleep:  {BHH GOOD/FAIR/POOR:22877}   Screenings: AUDIT    Flowsheet Row Admission (Discharged) from 05/04/2023 in [facility] BEHAVIORAL MEDICINE  Alcohol Use Disorder Identification Test Final Score (AUDIT) 0      PHQ2-9    Flowsheet Row Clinical Support from 12/07/2022 in [facility] [place]  PHQ-2 Total Score 4  PHQ-9 Total Score 11      Flowsheet Row ED from 10/02/2023 in [facility] Emergency Department at [facility] ED from 09/29/2023 in [facility] Emergency Department at [facility] ED from 09/10/2023 in [facility] Emergency Department at [facility]  C-SSRS RISK CATEGORY No Risk No Risk No Risk       Assessment and Plan:  Assessment  Plan   The patient demonstrates the following risk factors for suicide: Chronic risk factors for suicide include: {Chronic Risk Factors for ZOXWRUE:45409811}. Acute risk factors for suicide include: {Acute Risk Factors for BJYNWGN:56213086}. Protective factors for this patient include: {Protective Factors for Suicide VHQI:69629528}. Considering these factors, the overall suicide risk at this point appears to be {Desc; low/moderate/high:110033}. Patient {ACTION; IS/IS UXL:24401027}  appropriate for outpatient follow up.   Collaboration of Care: {BH OP Collaboration of Care:21014065}  Patient/Guardian was advised Release of Information must be obtained prior to any record release in order to collaborate their care with an outside provider. Patient/Guardian was advised if they have not already done so to contact the registration department to sign all necessary forms in order for [place] to release information regarding their care.   Consent: Patient/Guardian gives verbal consent for treatment and assignment of benefits for services provided during this visit. Patient/Guardian expressed understanding and agreed to  proceed.   Neysa Hotter, MD 1/12/20259:36 AM

## 2023-10-03 NOTE — ED Notes (Signed)
 Patient ambulated independently to the bathroom. RN informed patient that RN is waiting on EMS to discharge.

## 2023-10-03 NOTE — ED Provider Notes (Signed)
 3:25 AM  Pt stable, resting.  Awaiting transport home by [facility].  Transport not available at this time due to road conditions from recent bad weather.  Son contacted and reports he is unable to pick patient up.   Osmara Drummonds, Josette SAILOR, DO 10/03/23 [phone]

## 2023-10-03 NOTE — ED Notes (Signed)
 Patient ambulated back to her bed safely.

## 2023-10-03 NOTE — ED Notes (Signed)
 Patient signed taxi voucher on paper. Given to first nurse.

## 2023-10-03 NOTE — ED Notes (Signed)
 Charge RN went to talk with patient about transportation for discharge.

## 2023-10-03 NOTE — ED Notes (Signed)
 Verified Address with patient.

## 2023-10-03 NOTE — ED Notes (Signed)
 Pt resting. Given warm blankets.

## 2023-10-06 ENCOUNTER — Encounter: Payer: Self-pay | Admitting: Internal Medicine

## 2023-10-06 ENCOUNTER — Inpatient Hospital Stay (HOSPITAL_BASED_OUTPATIENT_CLINIC_OR_DEPARTMENT_OTHER): Payer: Medicare HMO | Admitting: Internal Medicine

## 2023-10-06 ENCOUNTER — Inpatient Hospital Stay: Payer: Medicare HMO | Attending: Internal Medicine

## 2023-10-06 VITALS — BP 122/74 | HR 68 | Temp 97.8°F | Resp 17 | Wt 104.0 lb

## 2023-10-06 DIAGNOSIS — C541 Malignant neoplasm of endometrium: Secondary | ICD-10-CM

## 2023-10-06 DIAGNOSIS — Z8542 Personal history of malignant neoplasm of other parts of uterus: Secondary | ICD-10-CM | POA: Diagnosis not present

## 2023-10-06 DIAGNOSIS — Z808 Family history of malignant neoplasm of other organs or systems: Secondary | ICD-10-CM | POA: Insufficient documentation

## 2023-10-06 DIAGNOSIS — R42 Dizziness and giddiness: Secondary | ICD-10-CM | POA: Insufficient documentation

## 2023-10-06 DIAGNOSIS — Z8051 Family history of malignant neoplasm of kidney: Secondary | ICD-10-CM | POA: Insufficient documentation

## 2023-10-06 DIAGNOSIS — Z90710 Acquired absence of both cervix and uterus: Secondary | ICD-10-CM | POA: Diagnosis not present

## 2023-10-06 DIAGNOSIS — Z9049 Acquired absence of other specified parts of digestive tract: Secondary | ICD-10-CM | POA: Insufficient documentation

## 2023-10-06 DIAGNOSIS — Z85038 Personal history of other malignant neoplasm of large intestine: Secondary | ICD-10-CM | POA: Diagnosis not present

## 2023-10-06 DIAGNOSIS — Z95828 Presence of other vascular implants and grafts: Secondary | ICD-10-CM

## 2023-10-06 DIAGNOSIS — K5900 Constipation, unspecified: Secondary | ICD-10-CM | POA: Diagnosis not present

## 2023-10-06 DIAGNOSIS — G629 Polyneuropathy, unspecified: Secondary | ICD-10-CM | POA: Diagnosis not present

## 2023-10-06 DIAGNOSIS — F17210 Nicotine dependence, cigarettes, uncomplicated: Secondary | ICD-10-CM | POA: Insufficient documentation

## 2023-10-06 DIAGNOSIS — Z1509 Genetic susceptibility to other malignant neoplasm: Secondary | ICD-10-CM | POA: Diagnosis not present

## 2023-10-06 LAB — CMP (CANCER CENTER ONLY)
ALT: 18 U/L (ref 0–44)
AST: 18 U/L (ref 15–41)
Albumin: 4.3 g/dL (ref 3.5–5.0)
Alkaline Phosphatase: 41 U/L (ref 38–126)
Anion gap: 9 (ref 5–15)
BUN: 11 mg/dL (ref 6–20)
CO2: 27 mmol/L (ref 22–32)
Calcium: 9.1 mg/dL (ref 8.9–10.3)
Chloride: 103 mmol/L (ref 98–111)
Creatinine: 0.75 mg/dL (ref 0.44–1.00)
GFR, Estimated: >60 mL/min (ref >60)
Glucose, Bld: 84 mg/dL (ref 70–99)
Potassium: 3.4 mmol/L — ABNORMAL LOW (ref 3.5–5.1)
Sodium: 139 mmol/L (ref 135–145)
Total Bilirubin: 0.8 mg/dL (ref 0.0–1.2)
Total Protein: 6.9 g/dL (ref 6.5–8.1)

## 2023-10-06 MED ORDER — SODIUM CHLORIDE 0.9% FLUSH
10.0000 mL | Freq: Once | INTRAVENOUS | Status: AC
  Administered 2023-10-06: 10 mL via INTRAVENOUS
  Filled 2023-10-06: qty 10

## 2023-10-06 NOTE — Progress Notes (Signed)
[facility] CONSULT NOTE  Patient Care Team: Hamrick, Durward Fortes, MD as PCP - General (Family Medicine) Jena Gauss, Gerrit Friends, MD as Consulting Physician (Gastroenterology) Earna Coder, MD as Consulting Physician (Hematology and Oncology) Hamrick, Durward Fortes, MD (Family Medicine)  CHIEF COMPLAINTS/PURPOSE OF CONSULTATION:  Endometrial cancer  #  Oncology History Overview Note  # OCT 2015- Uterine ca- STAGE IV [multiple lung nodules]; Nov 2015S/p radical hysterectomy, BSO, pelvic lymph node dissection/vaginal biopsies/extensive pelvic disease including positive lymph nodes and positive distal vaginal biopsy.]; [[facility]]-endometroid G-2; LVI; positive Pelvic LN/vaginal Bx Jan-April 2016- carbo-Taxol x6 cycles [facility]; Dr.Penland]  # Oct 2016- colo- high grade dysplasia [Dr.Rourk; Dr.Jenkins s/p right hemi-colectomy]; last colo- may 2018;    # Genetic testing- [MSH6 mutation]/Lynch syndrome; EGD/colo every 2-3 qyears; urin cytology q6M  Histologic grade G2, histologic type endometrioid adenocarcinoma -----------------------------------------------------  DIAGNOSIS: Endometrioid endometrial cancer  STAGE: IV  ;GOALS: Control/palliative  CURRENT/MOST RECENT THERAPY surveillance    Endometrial cancer (HCC)  07/12/2014 Imaging   CT C/A/P, pelvic adenopathy, multiple pulmonary nodules concerning for metastatic disease   07/30/2014 Initial Diagnosis   Endometrial cancer   07/31/2014 Pathology Results   endometrioid adenocarcinoma, G2, lymphovascular invasion present, positive pelvic lymph node and vaginal biopsy   07/31/2014 Definitive Surgery   radical hysterectomy, BSO, pelvic lymph node dissection and vaginal biopsies   09/25/2014 Procedure   Port-A-Cath placement in IR   10/18/2014 - 01/10/2015 Chemotherapy   Carboplatin/Taxol. First cycle given at [facility].  S/P 6 cycles total, 5 cycles given at [facility].   07/04/2015 Procedure   Colonoscopy by Dr. Jena Gauss   07/04/2015  Pathology Results   Colon, polyp(s), vicinity of hepatic flexure - TUBULAR ADENOMA WITH FOCAL HIGH GRADE DYSPLASIA.    Procedure   Partial colectomy by Dr. Lovell Sheehan scheduled for 08/30/2015   10/31/2015 Genetic Testing   [facility] SYNDROME MSH6 Mutation   04/27/2016 Imaging   CT CAP- No acute process or evidence of metastatic disease in the chest. Right middle lobe pulmonary nodule is unchanged back to 11/20/2014, favoring a benign etiology   11/10/2016 Imaging   CT CAP- 1. No evidence of metastatic disease in the chest, abdomen or pelvis. 2. No evidence of local tumor recurrence at the ileocolic anastomosis in the right abdomen or in the pelvis. Decreased mild fat stranding at the base of the right mesentery, most consistent with postsurgical scarring. 3. Aortic atherosclerosis.   Adenocarcinoma of colon (HCC)  07/04/2015 Pathology Results   Diagnosis 1. Colon, polyp(s), vicinity of hepatic flexure - TUBULAR ADENOMA WITH FOCAL HIGH GRADE DYSPLASIA. - NO INVASIVE CARCINOMA. 2. Colon, polyp(s), sigmoid - HYPERPLASTIC POLYP. - NO DYSPLASIA OR MALIGNANCY.   07/04/2015 Procedure   Colonoscopoy by Dr. Jena Gauss- large polypoid colonic lesion.   08/30/2015 Definitive Surgery   Dr. Lovell Sheehan- right segmental colon resection   08/30/2015 Pathology Results   [id number] intramucosal adenocarcinoma of colon, 0.2 cm inding the laminal propria with negative resection margins and 0/17 lymph nodes.    HISTORY OF PRESENTING ILLNESS: Patient ambulating-independently.   Alone.   Julia Osborne 63 y.o.  female history of endometrial cancer stage IV/Lynch syndrome-current on surveillance is here for follow-up.  Patient states she has been "feeling woozy" and several recent falls.  Mostly orthostatic.  Patient had multiple evaluations in the emergency room multiple brain MRI/CT scan.  Patient also currently awaiting evaluation with neurology and cardiology.  Patient has lost 20 pounds in last few months. Denies  Nausea. Has fatigue. No pelvic  pain. No vaginal discharge or bleeding.   Patient denies any worsening shortness of breath or cough.   Denies abdominal pain.  Unfortunately continues to smoke. No blood in stools or black or stools.  No blood in urine.  Review of Systems  Constitutional:  Positive for malaise/fatigue. Negative for chills, diaphoresis, fever and weight loss.  HENT:  Negative for sore throat.   Eyes:  Negative for double vision.  Respiratory:  Negative for cough, hemoptysis, sputum production, shortness of breath and wheezing.   Cardiovascular:  Negative for chest pain, palpitations, orthopnea and leg swelling.  Gastrointestinal:  Negative for blood in stool, constipation, diarrhea, heartburn, melena, nausea and vomiting.  Genitourinary:  Negative for dysuria, frequency and urgency.  Musculoskeletal:  Positive for back pain and joint pain.  Skin: Negative.  Negative for itching and rash.  Neurological:  Positive for tingling. Negative for dizziness, focal weakness, weakness and headaches.  Endo/Heme/Allergies:  Does not bruise/bleed easily.  Psychiatric/Behavioral:  Negative for depression. The patient is not nervous/anxious and does not have insomnia.      MEDICAL HISTORY:  Past Medical History:  Diagnosis Date   Abdominal pain, acute, left upper quadrant    Abdominal pain, epigastric    Abnormal CT scan, sigmoid colon    Abnormal vaginal Pap smear    Adenocarcinoma of colon (HCC) 09/13/2015   Partial colon resection and chemo tx's.    Anemia    Anxiety    Anxiety disorder, unspecified    Aphasia    Asthma    Cancer (HCC)    endometrial; cancer cells in intestine   Chronic pain of left knee    Colon neoplasm    Constipation    Conversion reaction    COPD (chronic obstructive pulmonary disease) (HCC)    no definite diagnosis   Depression    Diverticulitis    Diverticulosis of colon without hemorrhage    Dyspareunia 05/16/2015   Dysphasia    Endometrial  cancer (HCC)    Esophageal dysphagia    Family history of cancer    Family history of kidney cancer    Ganglion of left knee    Gastric intestinal metaplasia    GERD (gastroesophageal reflux disease)    Glaucoma    Hematochezia    History of colonic polyps    Hyperlipidemia    Indigestion    Lynch syndrome    Mucosal abnormality of stomach    Nausea and vomiting    Neuropathy    feet and hands   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 drug (HCC)    PONV (postoperative nausea and vomiting)    Pre-diabetes    Primary osteoarthritis of left knee    Rectal bleeding    Reflux esophagitis    Soft tissue swelling of knee joint    Uterine fibroid    UTI (urinary tract infection) 04/26/2023   on cipro   Vaginal dryness 05/16/2015   Vaginal itching    Vaginal Pap smear, abnormal     SURGICAL HISTORY: Past Surgical History:  Procedure Laterality Date   ABDOMINAL HYSTERECTOMY     APPENDECTOMY     BIOPSY N/A 03/14/2015   Procedure: BIOPSY;  Surgeon: Corbin Ade, MD;  Location: [facility];  Service: Endoscopy;  Laterality: N/A;  Gastric   COLONOSCOPY N/A 01/28/2017   Procedure: COLONOSCOPY;  Surgeon: Corbin Ade, MD;  Location: [facility];  Service: Endoscopy;  Laterality: N/A;  11:30am   COLONOSCOPY WITH PROPOFOL N/A 03/14/2015   RMR:  Internal hemorrhoids. colonic diverticulosis. Incomplete examination. Prepartation inadequate.   COLONOSCOPY WITH PROPOFOL N/A 07/04/2015   RMR: Colonic diverticulosis . Large polypoid lesion in the vicinity of the hepatic flexure status post saline-assisted piecmeal snare polypectomy  with ablation and tattooing as described. Sigmoid polyp removed as described above. sigmoid colon polyp hyperplastic, hepatic flexure polyp with TA with focal high grade dysplasia    COLONOSCOPY WITH PROPOFOL N/A 11/03/2018   Procedure: COLONOSCOPY WITH PROPOFOL;  Surgeon: Wyline Mood, MD;  Location: [facility] ENDOSCOPY;  Service: Gastroenterology;  Laterality: N/A;    COLONOSCOPY WITH PROPOFOL N/A 12/09/2020   Procedure: COLONOSCOPY WITH PROPOFOL;  Surgeon: Wyline Mood, MD;  Location: [facility] ENDOSCOPY;  Service: Gastroenterology;  Laterality: N/A;  COVID POSITIVE 10/30/2020   COLONOSCOPY WITH PROPOFOL N/A 01/20/2021   Procedure: COLONOSCOPY WITH PROPOFOL;  Surgeon: Wyline Mood, MD;  Location: [facility] ENDOSCOPY;  Service: Gastroenterology;  Laterality: N/A;   COLONOSCOPY WITH PROPOFOL N/A 12/10/2021   Procedure: COLONOSCOPY WITH PROPOFOL;  Surgeon: Wyline Mood, MD;  Location: [facility] ENDOSCOPY;  Service: Gastroenterology;  Laterality: N/A;   COLONOSCOPY WITH PROPOFOL N/A 02/16/2023   Procedure: COLONOSCOPY WITH PROPOFOL;  Surgeon: Wyline Mood, MD;  Location: [facility] ENDOSCOPY;  Service: Gastroenterology;  Laterality: N/A;   ENTEROSCOPY N/A 02/12/2020   Procedure: ENTEROSCOPY;  Surgeon: Wyline Mood, MD;  Location: [facility] ENDOSCOPY;  Service: Gastroenterology;  Laterality: N/A;   ESOPHAGEAL DILATION N/A 03/14/2015   Procedure: ESOPHAGEAL DILATION;  Surgeon: Corbin Ade, MD;  Location: [facility];  Service: Endoscopy;  Laterality: N/AElease Hashimoto 54   ESOPHAGOGASTRODUODENOSCOPY N/A 05/19/2016   Procedure: ESOPHAGOGASTRODUODENOSCOPY (EGD);  Surgeon: Corbin Ade, MD;  [facility];  Service: Endoscopy;  Laterality: N/A;  215   ESOPHAGOGASTRODUODENOSCOPY N/A 01/28/2017   Procedure: ESOPHAGOGASTRODUODENOSCOPY (EGD);  Surgeon: Corbin Ade, MD;  [facility];  Service: Endoscopy;  Laterality: N/A;   ESOPHAGOGASTRODUODENOSCOPY N/A 12/10/2021   Procedure: ESOPHAGOGASTRODUODENOSCOPY (EGD);  Surgeon: Wyline Mood, MD;  Location: [facility] ENDOSCOPY;  Service: Gastroenterology;  Laterality: N/A;   ESOPHAGOGASTRODUODENOSCOPY (EGD) WITH PROPOFOL N/A 03/14/2015   RMR: Mild erosive reflux esophagitis status post passage o f a Maloney dilator. Abnormal gastric mucosa of uncertain significance as described above. status post biopsy, benign   ESOPHAGOGASTRODUODENOSCOPY (EGD)  WITH PROPOFOL N/A 11/18/2017   Procedure: ESOPHAGOGASTRODUODENOSCOPY (EGD) WITH PROPOFOL;  Surgeon: Corbin Ade, MD;  [facility];  Service: Endoscopy;  Laterality: N/A;  12:15pm   ESOPHAGOGASTRODUODENOSCOPY (EGD) WITH PROPOFOL N/A 11/03/2018   Procedure: ESOPHAGOGASTRODUODENOSCOPY (EGD) WITH PROPOFOL;  Surgeon: Wyline Mood, MD;  Location: [facility] ENDOSCOPY;  Service: Gastroenterology;  Laterality: N/A;   ESOPHAGOGASTRODUODENOSCOPY (EGD) WITH PROPOFOL N/A 02/16/2023   Procedure: ESOPHAGOGASTRODUODENOSCOPY (EGD) WITH PROPOFOL;  Surgeon: Wyline Mood, MD;  Location: [facility] ENDOSCOPY;  Service: Gastroenterology;  Laterality: N/A;   GIVENS CAPSULE STUDY N/A 12/25/2019   Procedure: GIVENS CAPSULE STUDY;  Surgeon: Wyline Mood, MD;  Location: [facility] ENDOSCOPY;  Service: Gastroenterology;  Laterality: N/A;   MALONEY DILATION N/A 05/19/2016   Procedure: [name] DILATION;  Surgeon: Corbin Ade, MD;  [facility];  Service: Endoscopy;  Laterality: N/A;   MALONEY DILATION N/A 01/28/2017   Procedure: [name] DILATION;  Surgeon: Corbin Ade, MD;  Location[facility];  Service: Endoscopy;  Laterality: N/A;   MALONEY DILATION N/A 11/18/2017   Procedure: [name] DILATION;  Surgeon: Corbin Ade, MD;  [facility];  Service: Endoscopy;  Laterality: N/A;   PARTIAL COLECTOMY  08/30/2015   polyp with adenocarcinoma  PARTIAL COLECTOMY  08/29/2018   POLYPECTOMY N/A 07/04/2015   Procedure: POLYPECTOMY;  Surgeon: Corbin Ade, MD;  Location: [facility];  Service: Endoscopy;  Laterality: N/A;   PORTACATH PLACEMENT Right 09/21/2014   TUBAL LIGATION      SOCIAL HISTORY: Social History   Socioeconomic History   Marital status: Widowed    Spouse name: Not on file   Number of children: 2   Years of education: Not on file   Highest education level: Not on file  Occupational History   Not on file  Tobacco Use   Smoking status: Never   Smokeless tobacco: Never  Vaping  Use   Vaping status: Never Used  Substance and Sexual Activity   Alcohol use: No   Drug use: No   Sexual activity: Not Currently    Birth control/protection: Surgical    Comment: hyst  Other Topics Concern   Not on file  Social History Narrative   ** Merged History Encounter **       ** Data from: 04/28/23 Enc Dept: [facility]-PRE ADM TESTING   #  [place]; smoking- 1/2 ppd; no alcohol; used to run machines;   FHx-Dad- MI; ? Cancer on autopsy; sisters/aunts- brain; lung cancer x2; oldest sister- kidney cancer  Pt has 57 year old son;daughter 33 years. Brothers- states to have s   poken to her family re: importance of them being checked for lynch.        ** Data from: 05/04/23 Enc Dept: [facility]   Lives alone with 4 large dogs.    Social Drivers of Health   Financial Resource Strain: Medium Risk (12/07/2022)   Overall Financial Resource Strain (CARDIA)    Difficulty of Paying Living Expenses: Somewhat hard  Food Insecurity: Medium Risk (09/23/2023)   Received from [facility]   Hunger Vital Sign    Worried About Running Out of Food in the Last Year: Sometimes true    Ran Out of Food in the Last Year: Sometimes true  Transportation Needs: Unmet Transportation Needs (09/23/2023)   Received from [employer]    In the past 12 months, has lack of reliable transportation kept you from medical appointments, meetings, work or from getting things needed for daily living? : Yes  Physical Activity: Inactive (12/07/2022)   Exercise Vital Sign    Days of Exercise per Week: 0 days    Minutes of Exercise per Session: 0 min  Stress: Stress Concern Present (12/07/2022)   [employer] of Occupational Health - Occupational Stress Questionnaire    Feeling of Stress : To some extent  Social Connections: Socially Isolated (12/07/2022)   Social Connection and Isolation Panel [NHANES]    Frequency of Communication with Friends and Family: Once a week    Frequency  of Social Gatherings with Friends and Family: Never    Attends Religious Services: Never    Database administrator or Organizations: No    Attends Banker Meetings: Never    Marital Status: Widowed  Intimate Partner Violence: Not At Risk (05/04/2023)   Humiliation, Afraid, Rape, and Kick questionnaire    Fear of Current or Ex-Partner: No    Emotionally Abused: No    Physically Abused: No    Sexually Abused: No    FAMILY HISTORY:  Family History  Problem Relation Age of Onset   Other Mother        clot that went to heart, deceased age 20ss   Heart attack Father  age 32s, deceased   Stroke Father    Cancer Father        "at death determined he was ate up with cancer"   Hypertension Sister    Kidney cancer Sister 30   Diabetes Brother    Hypertension Brother    Endometriosis Daughter    Heart attack Maternal Grandfather    Diabetes Sister    Brain cancer Paternal Aunt    Cancer Paternal Uncle        NOS   Cancer Paternal Uncle        NOS   Cancer Paternal Uncle        NOS   Colon cancer Neg Hx     ALLERGIES:  is allergic to codeine.  MEDICATIONS:  Current Outpatient Medications  Medication Sig Dispense Refill   albuterol (PROVENTIL HFA;VENTOLIN HFA) 108 (90 Base) MCG/ACT inhaler Inhale 2 puffs into the lungs every 2 (two) hours as needed for wheezing or shortness of breath (cough). 1 Inhaler 3   albuterol (PROVENTIL) (2.5 MG/3ML) 0.083% nebulizer solution Take 3 mLs (2.5 mg total) by nebulization every 6 (six) hours as needed for wheezing or shortness of breath. 75 mL 12   aspirin EC 81 MG tablet Take 1 tablet (81 mg total) by mouth daily. Swallow whole. 30 tablet 12   atorvastatin (LIPITOR) 40 MG tablet Take 1 tablet (40 mg total) by mouth daily. 30 tablet 0   busPIRone (BUSPAR) 5 MG tablet Take 1 tablet (5 mg total) by mouth 3 (three) times daily. 60 tablet 0   clopidogrel (PLAVIX) 75 MG tablet Take by mouth.     escitalopram (LEXAPRO) 20 MG tablet  Take 1 tablet (20 mg total) by mouth at bedtime. 30 tablet 0   ferrous sulfate 325 (65 FE) MG EC tablet Take by mouth.     fluticasone (FLONASE) 50 MCG/ACT nasal spray Place 1 spray into both nostrils daily. 1 g 0   gabapentin (NEURONTIN) 100 MG capsule Take 2 capsules (200 mg total) by mouth at bedtime. 60 capsule 0   Glycerin, PF, (OPTASE COMFORT DRY EYE) 1 % SOLN Place 1 drop into both eyes daily at 6 (six) AM.     hydrOXYzine (ATARAX) 25 MG tablet Take 1 tablet (25 mg total) by mouth every 6 (six) hours as needed for anxiety. 30 tablet 0   latanoprost (XALATAN) 0.005 % ophthalmic solution Place 1 drop into the left eye at bedtime. 2.5 mL 12   loratadine (CLARITIN) 10 MG tablet Take 10 mg by mouth daily as needed for allergies.      LUMIGAN 0.01 % SOLN Place 1 drop into the right eye at bedtime.     montelukast (SINGULAIR) 10 MG tablet Take 1 tablet (10 mg total) by mouth daily. 30 tablet 0   Naphazoline-Pheniramine (ALLERGY EYE OP) Place 1-2 drops into both eyes 2 (two) times daily as needed (ITCHY, WATERY EYES).     pantoprazole (PROTONIX) 40 MG tablet Take 1 tablet (40 mg total) by mouth daily before breakfast. 15 tablet 0   sodium chloride (OCEAN) 0.65 % SOLN nasal spray Place 1 spray into both nostrils as needed for congestion.     traZODone (DESYREL) 50 MG tablet Take 0.5 tablets (25 mg total) by mouth at bedtime as needed for sleep. 30 tablet 0   meclizine (ANTIVERT) 25 MG tablet Take 1 tablet (25 mg total) by mouth 3 (three) times daily as needed for dizziness. (Patient not taking: Reported on 08/31/2023) 30 tablet 0  mirtazapine (REMERON) 7.5 MG tablet Take 1 tablet (7.5 mg total) by mouth at bedtime. (Patient not taking: Reported on 10/06/2023) 30 tablet 0   Plecanatide (TRULANCE) 3 MG TABS Take 1 tablet by mouth daily. (Patient not taking: Reported on 08/31/2023) 30 tablet 6   senna-docusate (SENOKOT-S) 8.6-50 MG tablet Take 2 tablets by mouth 2 (two) times daily. (Patient not taking:  Reported on 08/31/2023) 30 tablet 0   SIMBRINZA 1-0.2 % SUSP Apply 1 drop to eye 2 (two) times daily. (Patient not taking: Reported on 09/10/2023)     No current facility-administered medications for this visit.      Marland Kitchen  PHYSICAL EXAMINATION: ECOG PERFORMANCE STATUS: 0 - Asymptomatic  Vitals:   10/06/23 1504  BP: 122/74  Pulse: 68  Resp: 17  Temp: 97.8 F (36.6 C)  SpO2: 100%    Filed Weights   10/06/23 1504  Weight: 104 lb (47.2 kg)     Physical Exam Constitutional:      Comments: Patient is alone.  HENT:     Head: Normocephalic and atraumatic.     Mouth/Throat:     Pharynx: No oropharyngeal exudate.  Eyes:     Pupils: Pupils are equal, round, and reactive to light.  Cardiovascular:     Rate and Rhythm: Normal rate and regular rhythm.  Pulmonary:     Effort: No respiratory distress.     Breath sounds: No wheezing.  Abdominal:     General: Bowel sounds are normal. There is no distension.     Palpations: Abdomen is soft. There is no mass.     Tenderness: There is no abdominal tenderness. There is no guarding or rebound.  Musculoskeletal:        General: No tenderness. Normal range of motion.     Cervical back: Normal range of motion and neck supple.  Skin:    General: Skin is warm.     Comments:    Neurological:     Mental Status: She is alert and oriented to person, place, and time.  Psychiatric:        Mood and Affect: Affect normal.      LABORATORY DATA:  I have reviewed the data as listed Lab Results  Component Value Date   WBC 8.0 10/02/2023   HGB 12.3 10/02/2023   HCT 36.6 10/02/2023   MCV 91.7 10/02/2023   PLT 252 10/02/2023   Recent Labs    04/26/23 1324 05/02/23 1019 09/10/23 1232 09/10/23 1233 09/29/23 1123 09/29/23 1130 10/02/23 1324 10/06/23 1440  NA 138   < > 137   < > 140 139 137 139  K 3.6   < > 3.3*   < > 4.1 4.1 3.6 3.4*  CL 104   < > 105   < > 106 105 103 103  CO2 25   < > 25  --  26  --  25 27  GLUCOSE 108*   < > 99    < > 115* 110* 76 84  BUN 9   < > 9   < > 11 12 16 11   CREATININE 0.91   < > 0.88   < > 0.79 0.80 0.70 0.75  CALCIUM 9.2   < > 8.9  --  9.1  --  8.9 9.1  GFRNONAA >60   < > >60  --  >60  --  >60 >60  PROT 7.2   < > 6.4*  --  6.3*  --   --  6.9  ALBUMIN 4.3   < > 3.8  --  3.8  --   --  4.3  AST 12*   < > 16  --  18  --   --  18  ALT 14   < > 18  --  18  --   --  18  ALKPHOS 43   < > 43  --  38  --   --  41  BILITOT 0.9   < > 0.7  --  0.7  --   --  0.8  BILIDIR <0.1  --   --   --   --   --   --   --   IBILI NOT CALCULATED  --   --   --   --   --   --   --    < > = values in this interval not displayed.    RADIOGRAPHIC STUDIES: I have personally reviewed the radiological images as listed and agreed with the findings in the report. MR BRAIN WO CONTRAST Result Date: 09/29/2023 CLINICAL DATA:  Neuro deficit, acute, stroke suspected. Left-sided facial droop. EXAM: MRI HEAD WITHOUT CONTRAST TECHNIQUE: Multiplanar, multiecho pulse sequences of the brain and surrounding structures were obtained without intravenous contrast. COMPARISON:  Head CT earlier same day.  MRI 09/10/2023 FINDINGS: Brain: Diffusion imaging does not show any acute or subacute infarction or other cause of restricted diffusion. The brainstem and cerebellum are normal. Cerebral hemispheres are normal except for a focus of susceptibility artifact at the right frontoparietal vertex that could represent a tiny cavernoma or old microhemorrhage. No evidence of neoplastic mass lesion, acute hemorrhage, hydrocephalus or extra-axial collection. Vascular: Major vessels at the base of the brain show flow. Skull and upper cervical spine: Negative Sinuses/Orbits: Clear/normal Other: None IMPRESSION: No acute or reversible finding. Tiny focus of susceptibility artifact at the right frontoparietal vertex that could represent a tiny cavernoma or old microhemorrhage. Electronically Signed   By: Paulina Fusi M.D.   On: 09/29/2023 13:04   CT HEAD CODE  STROKE WO CONTRAST Result Date: 09/29/2023 CLINICAL DATA:  Code stroke. Neuro deficit, acute, stroke suspected. Left-sided facial droop. EXAM: CT HEAD WITHOUT CONTRAST TECHNIQUE: Contiguous axial images were obtained from the base of the skull through the vertex without intravenous contrast. RADIATION DOSE REDUCTION: This exam was performed according to the departmental dose-optimization program which includes automated exposure control, adjustment of the mA and/or kV according to patient size and/or use of iterative reconstruction technique. COMPARISON:  MR head without and with contrast 09/10/2023. FINDINGS: Brain: No acute infarct, hemorrhage, or mass lesion is present. No significant white matter lesions are present. Deep brain nuclei are within normal limits. The ventricles are of normal size. No significant extraaxial fluid collection is present. The brainstem and cerebellum are within normal limits. Midline structures are within normal limits. Vascular: No hyperdense vessel or unexpected calcification. Skull: Calvarium is intact. No focal lytic or blastic lesions are present. No significant extracranial soft tissue lesion is present. Sinuses/Orbits: The paranasal sinuses and mastoid air cells are clear. The globes and orbits are within normal limits. ASPECTS [facility] Stroke Program Early CT Score) - Ganglionic level infarction (caudate, lentiform nuclei, internal capsule, insula, M1-M3 cortex): 7/7 - Supraganglionic infarction (M4-M6 cortex): 3/3 Total score (0-10 with 10 being normal): 10/10 IMPRESSION: 1. Normal head CT. 2. Aspects is 10/10. Electronically Signed   By: Marin Roberts M.D.   On: 09/29/2023 11:27   MR BRAIN W WO CONTRAST Result Date:  09/10/2023 CLINICAL DATA:  Mental status change, unknown cause EXAM: MRI HEAD WITHOUT AND WITH CONTRAST TECHNIQUE: Multiplanar, multiecho pulse sequences of the brain and surrounding structures were obtained without and with intravenous contrast.  CONTRAST:  5mL GADAVIST GADOBUTROL 1 MMOL/ML IV SOLN COMPARISON:  MRI head 08/31/2023. FINDINGS: Brain: No acute infarction, acute hemorrhage, hydrocephalus, extra-axial collection or mass lesion. No pathologic enhancement. Small subcentimeter focus of T2 hypointensity in the high right frontal lobe with associated susceptibility artifact, compatible with prior microhemorrhage. Vascular: Major arterial flow voids are maintained at the skull base. Skull and upper cervical spine: Normal marrow signal. Sinuses/Orbits: Negative. IMPRESSION: No evidence of acute intracranial abnormality. Electronically Signed   By: Feliberto Harts M.D.   On: 09/10/2023 14:57   CT ANGIO HEAD NECK W WO CM W PERF (CODE STROKE) Result Date: 09/10/2023 CLINICAL DATA:  Difficulty speaking, abnormal gait, confusion EXAM: CT ANGIOGRAPHY HEAD AND NECK CT PERFUSION BRAIN TECHNIQUE: Multidetector CT imaging of the head and neck was performed using the standard protocol during bolus administration of intravenous contrast. Multiplanar CT image reconstructions and MIPs were obtained to evaluate the vascular anatomy. Carotid stenosis measurements (when applicable) are obtained utilizing NASCET criteria, using the distal internal carotid diameter as the denominator. Multiphase CT imaging of the brain was performed following IV bolus contrast injection. Subsequent parametric perfusion maps were calculated using RAPID software. RADIATION DOSE REDUCTION: This exam was performed according to the departmental dose-optimization program which includes automated exposure control, adjustment of the mA and/or kV according to patient size and/or use of iterative reconstruction technique. CONTRAST:  OMNIPAQUE IOHEXOL 350 MG/ML SOLN COMPARISON:  08/31/2023 CTA head and neck, 09/10/2023 CT head FINDINGS: CT HEAD FINDINGS For noncontrast findings, please see same day CT head. CTA NECK FINDINGS Aortic arch: Standard branching. Imaged portion shows no  evidence of aneurysm or dissection. No significant stenosis of the major arch vessel origins. Minimal aortic atherosclerosis. Right carotid system: No evidence of dissection, occlusion, or hemodynamically significant stenosis (greater than 50%). Left carotid system: No evidence of dissection, occlusion, or hemodynamically significant stenosis (greater than 50%). Vertebral arteries: No evidence of dissection, occlusion, or hemodynamically significant stenosis (greater than 50%). Skeleton: No acute osseous abnormality. Degenerative changes in the cervical spine. Other neck: No acute finding. Upper chest: No focal pulmonary opacity or pleural effusion. Review of the MIP images confirms the above findings CTA HEAD FINDINGS Anterior circulation: Both internal carotid arteries are patent to the termini, without significant stenosis. A1 segments patent. Normal anterior communicating artery. Anterior cerebral arteries are patent to their distal aspects without significant stenosis. No M1 stenosis or occlusion. MCA branches perfused to their distal aspects without significant stenosis. Posterior circulation: Vertebral arteries patent to the vertebrobasilar junction without significant stenosis. Posterior inferior cerebellar arteries patent proximally. Basilar patent to its distal aspect without significant stenosis. Superior cerebellar arteries patent proximally. Aplastic P1 segments. Fetal origin of bilateral PCAs from the posterior communicating arteries. PCAs perfused to their distal aspects without significant stenosis. Venous sinuses: As permitted by contrast timing, patent. Anatomic variants: Fetal origin of the bilateral PCAs. No evidence of aneurysm or vascular malformation. Review of the MIP images confirms the above findings CT Brain Perfusion Findings: ASPECTS: 10 CBF (<30%) Volume: 0mL Perfusion (Tmax>6.0s) volume: 0mL Mismatch Volume: 0mL Infarction Location:None. IMPRESSION: 1. No intracranial large vessel  occlusion or significant stenosis. 2. No hemodynamically significant stenosis in the neck. 3. No evidence of infarct core or ischemic penumbra on CT perfusion. 4. Aortic atherosclerosis. Aortic Atherosclerosis (ICD10-I70.0).  Imaging results were communicated on 09/10/2023 at 1:02 pm to provider STACK via secure text paging. Electronically Signed   By: Wiliam Ke M.D.   On: 09/10/2023 13:03   CT HEAD CODE STROKE WO CONTRAST Result Date: 09/10/2023 CLINICAL DATA:  Code stroke.  Difficulty speaking.  Abnormal gait. EXAM: CT HEAD WITHOUT CONTRAST TECHNIQUE: Contiguous axial images were obtained from the base of the skull through the vertex without intravenous contrast. RADIATION DOSE REDUCTION: This exam was performed according to the departmental dose-optimization program which includes automated exposure control, adjustment of the mA and/or kV according to patient size and/or use of iterative reconstruction technique. COMPARISON:  Brain MR 08/31/2023 FINDINGS: Brain: No hemorrhage. No hydrocephalus. No extra-axial fluid collection. No CT evidence of an acute cortical infarct. No mass effect. No mass lesion. Vascular: No hyperdense vessel or unexpected calcification. Skull: Normal. Negative for fracture or focal lesion. Sinuses/Orbits: No middle ear or mastoid effusion. Paranasal sinuses are clear. Orbits are unremarkable. Other: None. ASPECTS [facility] Stroke Program Early CT Score): 10 IMPRESSION: No hemorrhage or CT evidence of an acute cortical infarct. Findings were paged to Dr. Thomasenia Bottoms on 09/10/23 at 12:45 PM Electronically Signed   By: Lorenza Cambridge M.D.   On: 09/10/2023 12:46    ASSESSMENT & PLAN:   Endometrial cancer (HCC) # Uterine Cancer endometrioid type grade 2; stage IV [lung nodules at presentation 2015/]-APRIL 30th, 2024- - CT scan abdomen pelvis/chest-negative for any obvious recurrence.   Stable. No clinical evidence of recurrence.   # Ortho stasis: ? Etiology- feeling woozy" and several  recent falls.  Mostly orthostatic- ? Etiology- awaiting cardiologist/neurology evaluation.   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 Mammogram May 2024- WNL.   #Active smoker on Wellbutrin-discussed smoking cessation; DECLINES.   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   # Constipation- on amitizia-stable.   # IV access: port flush- q 3 M. Stable; discussed re: pro and cons of keeping the port vs. Explantation.    DISPOSITION: # Referral to IR re: mediport explantation.  # port flush every 3 months # follow up in 6 months/labs;port flush-- cbc/cmp/ca-125/urine cytology; CT CAP- Dr.B  # I reviewed the blood work- with the patient in detail; also reviewed the imaging independently [as summarized above]; and with the patient in detail.      All questions were answered. The patient knows to call the clinic with any problems, questions or concerns.     Earna Coder, MD 10/07/2023 11:22 AM

## 2023-10-06 NOTE — Assessment & Plan Note (Addendum)
#   Uterine Cancer endometrioid type grade 2; stage IV [lung nodules at presentation 2015/]-APRIL 30th, 2024- - CT scan abdomen pelvis/chest-negative for any obvious recurrence.   Stable. No clinical evidence of recurrence.   # Ortho stasis: ? Etiology- feeling woozy" and several recent falls.  Mostly orthostatic- ? Etiology- awaiting cardiologist/neurology evaluation.   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 Mammogram May 2024- WNL.   #Active smoker on Wellbutrin-discussed smoking cessation; DECLINES.   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   # Constipation- on amitizia-stable.   # IV access: port flush- q 3 M. Stable; discussed re: pro and cons of keeping the port vs. Explantation.    DISPOSITION: # Referral to IR re: mediport explantation.  # port flush every 3 months # follow up in 6 months/labs;port flush-- cbc/cmp/ca-125/urine cytology; CT CAP- Dr.B  # I reviewed the blood work- with the patient in detail; also reviewed the imaging independently [as summarized above]; and with the patient in detail.

## 2023-10-06 NOTE — Progress Notes (Signed)
Patient states she has been "feeling woozy" and several recent falls

## 2023-10-07 ENCOUNTER — Ambulatory Visit: Payer: Medicare HMO | Admitting: Psychiatry

## 2023-10-07 DIAGNOSIS — R1111 Vomiting without nausea: Secondary | ICD-10-CM | POA: Diagnosis not present

## 2023-10-07 DIAGNOSIS — R197 Diarrhea, unspecified: Secondary | ICD-10-CM | POA: Diagnosis not present

## 2023-10-07 DIAGNOSIS — R112 Nausea with vomiting, unspecified: Secondary | ICD-10-CM | POA: Diagnosis not present

## 2023-10-07 DIAGNOSIS — R5381 Other malaise: Secondary | ICD-10-CM | POA: Diagnosis not present

## 2023-10-07 DIAGNOSIS — R569 Unspecified convulsions: Secondary | ICD-10-CM | POA: Diagnosis not present

## 2023-10-07 DIAGNOSIS — R1084 Generalized abdominal pain: Secondary | ICD-10-CM | POA: Diagnosis not present

## 2023-10-07 DIAGNOSIS — E43 Unspecified severe protein-calorie malnutrition: Secondary | ICD-10-CM | POA: Diagnosis not present

## 2023-10-07 LAB — CA 125: Cancer Antigen (CA) 125: 13.2 U/mL (ref 0.0–38.1)

## 2023-10-08 DIAGNOSIS — R197 Diarrhea, unspecified: Secondary | ICD-10-CM | POA: Insufficient documentation

## 2023-10-08 DIAGNOSIS — R569 Unspecified convulsions: Secondary | ICD-10-CM

## 2023-10-08 DIAGNOSIS — I69398 Other sequelae of cerebral infarction: Secondary | ICD-10-CM | POA: Insufficient documentation

## 2023-10-08 DIAGNOSIS — E43 Unspecified severe protein-calorie malnutrition: Secondary | ICD-10-CM | POA: Insufficient documentation

## 2023-10-08 HISTORY — DX: Unspecified convulsions: R56.9

## 2023-10-08 HISTORY — DX: Diarrhea, unspecified: R19.7

## 2023-10-08 HISTORY — DX: Unspecified severe protein-calorie malnutrition: E43

## 2023-10-08 HISTORY — DX: Other sequelae of cerebral infarction: I69.398

## 2023-10-11 DIAGNOSIS — E785 Hyperlipidemia, unspecified: Secondary | ICD-10-CM | POA: Diagnosis not present

## 2023-10-11 DIAGNOSIS — F419 Anxiety disorder, unspecified: Secondary | ICD-10-CM | POA: Diagnosis not present

## 2023-10-11 DIAGNOSIS — R112 Nausea with vomiting, unspecified: Secondary | ICD-10-CM | POA: Diagnosis not present

## 2023-10-11 DIAGNOSIS — Z9181 History of falling: Secondary | ICD-10-CM | POA: Diagnosis not present

## 2023-10-11 DIAGNOSIS — E43 Unspecified severe protein-calorie malnutrition: Secondary | ICD-10-CM | POA: Diagnosis not present

## 2023-10-11 DIAGNOSIS — M6281 Muscle weakness (generalized): Secondary | ICD-10-CM | POA: Diagnosis not present

## 2023-10-11 DIAGNOSIS — R569 Unspecified convulsions: Secondary | ICD-10-CM | POA: Diagnosis not present

## 2023-10-11 DIAGNOSIS — I69398 Other sequelae of cerebral infarction: Secondary | ICD-10-CM | POA: Diagnosis not present

## 2023-10-11 DIAGNOSIS — R197 Diarrhea, unspecified: Secondary | ICD-10-CM | POA: Diagnosis not present

## 2023-10-12 DIAGNOSIS — R55 Syncope and collapse: Secondary | ICD-10-CM | POA: Diagnosis not present

## 2023-10-12 DIAGNOSIS — Z8673 Personal history of transient ischemic attack (TIA), and cerebral infarction without residual deficits: Secondary | ICD-10-CM | POA: Diagnosis not present

## 2023-10-12 DIAGNOSIS — R519 Headache, unspecified: Secondary | ICD-10-CM | POA: Diagnosis not present

## 2023-10-12 DIAGNOSIS — F32A Depression, unspecified: Secondary | ICD-10-CM | POA: Diagnosis not present

## 2023-10-15 ENCOUNTER — Other Ambulatory Visit: Payer: Self-pay

## 2023-10-15 ENCOUNTER — Emergency Department (HOSPITAL_COMMUNITY): Payer: Medicare HMO

## 2023-10-15 ENCOUNTER — Encounter (HOSPITAL_COMMUNITY): Payer: Self-pay | Admitting: Emergency Medicine

## 2023-10-15 ENCOUNTER — Emergency Department (HOSPITAL_COMMUNITY)
Admission: EM | Admit: 2023-10-15 | Discharge: 2023-10-16 | Disposition: A | Discharge status: Other Acute Inpatient Hospital | Payer: Medicare HMO | Attending: Emergency Medicine | Admitting: Emergency Medicine

## 2023-10-15 DIAGNOSIS — R45851 Suicidal ideations: Secondary | ICD-10-CM | POA: Insufficient documentation

## 2023-10-15 DIAGNOSIS — Z79899 Other long term (current) drug therapy: Secondary | ICD-10-CM | POA: Insufficient documentation

## 2023-10-15 DIAGNOSIS — Z7902 Long term (current) use of antithrombotics/antiplatelets: Secondary | ICD-10-CM | POA: Diagnosis not present

## 2023-10-15 DIAGNOSIS — F332 Major depressive disorder, recurrent severe without psychotic features: Secondary | ICD-10-CM | POA: Diagnosis not present

## 2023-10-15 DIAGNOSIS — Z85038 Personal history of other malignant neoplasm of large intestine: Secondary | ICD-10-CM | POA: Insufficient documentation

## 2023-10-15 DIAGNOSIS — Z7951 Long term (current) use of inhaled steroids: Secondary | ICD-10-CM | POA: Diagnosis not present

## 2023-10-15 DIAGNOSIS — F4312 Post-traumatic stress disorder, chronic: Secondary | ICD-10-CM | POA: Diagnosis present

## 2023-10-15 DIAGNOSIS — J449 Chronic obstructive pulmonary disease, unspecified: Secondary | ICD-10-CM | POA: Diagnosis not present

## 2023-10-15 DIAGNOSIS — Z8542 Personal history of malignant neoplasm of other parts of uterus: Secondary | ICD-10-CM | POA: Diagnosis not present

## 2023-10-15 DIAGNOSIS — R251 Tremor, unspecified: Secondary | ICD-10-CM | POA: Diagnosis not present

## 2023-10-15 DIAGNOSIS — Z7982 Long term (current) use of aspirin: Secondary | ICD-10-CM | POA: Diagnosis not present

## 2023-10-15 DIAGNOSIS — R531 Weakness: Secondary | ICD-10-CM | POA: Insufficient documentation

## 2023-10-15 DIAGNOSIS — Z20822 Contact with and (suspected) exposure to covid-19: Secondary | ICD-10-CM | POA: Diagnosis not present

## 2023-10-15 DIAGNOSIS — I10 Essential (primary) hypertension: Secondary | ICD-10-CM | POA: Diagnosis not present

## 2023-10-15 DIAGNOSIS — R1310 Dysphagia, unspecified: Secondary | ICD-10-CM | POA: Diagnosis not present

## 2023-10-15 DIAGNOSIS — F331 Major depressive disorder, recurrent, moderate: Secondary | ICD-10-CM | POA: Diagnosis present

## 2023-10-15 LAB — CBC WITH DIFFERENTIAL/PLATELET
Abs Immature Granulocytes: 0.02 10*3/uL (ref 0.00–0.07)
Basophils Absolute: 0 10*3/uL (ref 0.0–0.1)
Basophils Relative: 1 %
Eosinophils Absolute: 0 10*3/uL (ref 0.0–0.5)
Eosinophils Relative: 1 %
HCT: 38.2 % (ref 36.0–46.0)
Hemoglobin: 12.8 g/dL (ref 12.0–15.0)
Immature Granulocytes: 0 %
Lymphocytes Relative: 35 %
Lymphs Abs: 2.3 10*3/uL (ref 0.7–4.0)
MCH: 31 pg (ref 26.0–34.0)
MCHC: 33.5 g/dL (ref 30.0–36.0)
MCV: 92.5 fL (ref 80.0–100.0)
Monocytes Absolute: 0.4 10*3/uL (ref 0.1–1.0)
Monocytes Relative: 6 %
Neutro Abs: 3.7 10*3/uL (ref 1.7–7.7)
Neutrophils Relative %: 57 %
Platelets: 243 10*3/uL (ref 150–400)
RBC: 4.13 MIL/uL (ref 3.87–5.11)
RDW: 12.7 % (ref 11.5–15.5)
WBC: 6.5 10*3/uL (ref 4.0–10.5)
nRBC: 0 % (ref 0.0–0.2)

## 2023-10-15 LAB — COMPREHENSIVE METABOLIC PANEL
ALT: 18 U/L (ref 0–44)
AST: 18 U/L (ref 15–41)
Albumin: 4.1 g/dL (ref 3.5–5.0)
Alkaline Phosphatase: 41 U/L (ref 38–126)
Anion gap: 9 (ref 5–15)
BUN: 9 mg/dL (ref 6–20)
CO2: 28 mmol/L (ref 22–32)
Calcium: 9.5 mg/dL (ref 8.9–10.3)
Chloride: 102 mmol/L (ref 98–111)
Creatinine, Ser: 0.82 mg/dL (ref 0.44–1.00)
GFR, Estimated: >60 mL/min (ref >60)
Glucose, Bld: 96 mg/dL (ref 70–99)
Potassium: 3.7 mmol/L (ref 3.5–5.1)
Sodium: 139 mmol/L (ref 135–145)
Total Bilirubin: 0.8 mg/dL (ref 0.0–1.2)
Total Protein: 6.7 g/dL (ref 6.5–8.1)

## 2023-10-15 LAB — URINALYSIS, W/ REFLEX TO CULTURE (INFECTION SUSPECTED)
Bilirubin Urine: NEGATIVE
Glucose, UA: NEGATIVE mg/dL
Ketones, ur: NEGATIVE mg/dL
Nitrite: NEGATIVE
Protein, ur: NEGATIVE mg/dL
Specific Gravity, Urine: 1.012 (ref 1.005–1.030)
pH: 7 (ref 5.0–8.0)

## 2023-10-15 LAB — ETHANOL: Alcohol, Ethyl (B): <10 mg/dL (ref <10)

## 2023-10-15 LAB — MAGNESIUM: Magnesium: 2.2 mg/dL (ref 1.7–2.4)

## 2023-10-15 LAB — ACETAMINOPHEN LEVEL: Acetaminophen (Tylenol), Serum: <10 ug/mL — ABNORMAL LOW (ref 10–30)

## 2023-10-15 LAB — SALICYLATE LEVEL: Salicylate Lvl: <7 mg/dL — ABNORMAL LOW (ref 7.0–30.0)

## 2023-10-15 MED ORDER — NICOTINE 21 MG/24HR TD PT24
21.0000 mg | MEDICATED_PATCH | Freq: Every day | TRANSDERMAL | Status: DC
  Administered 2023-10-15: 21 mg via TRANSDERMAL
  Filled 2023-10-15: qty 1

## 2023-10-15 MED ORDER — ESCITALOPRAM OXALATE 10 MG PO TABS
20.0000 mg | ORAL_TABLET | Freq: Every day | ORAL | Status: DC
  Administered 2023-10-15: 20 mg via ORAL
  Filled 2023-10-15: qty 2

## 2023-10-15 MED ORDER — GABAPENTIN 100 MG PO CAPS
200.0000 mg | ORAL_CAPSULE | Freq: Every day | ORAL | Status: DC
  Administered 2023-10-15: 200 mg via ORAL
  Filled 2023-10-15: qty 2

## 2023-10-15 MED ORDER — BRIMONIDINE TARTRATE 0.2 % OP SOLN
1.0000 [drp] | Freq: Every day | OPHTHALMIC | Status: DC
  Administered 2023-10-16: 1 [drp] via OPHTHALMIC
  Filled 2023-10-15: qty 5

## 2023-10-15 MED ORDER — FLUTICASONE PROPIONATE 50 MCG/ACT NA SUSP
1.0000 | Freq: Every day | NASAL | Status: DC
  Filled 2023-10-15: qty 16

## 2023-10-15 MED ORDER — HYDROXYZINE HCL 25 MG PO TABS
25.0000 mg | ORAL_TABLET | Freq: Four times a day (QID) | ORAL | Status: DC | PRN
  Administered 2023-10-15: 25 mg via ORAL
  Filled 2023-10-15: qty 1

## 2023-10-15 MED ORDER — ALBUTEROL SULFATE HFA 108 (90 BASE) MCG/ACT IN AERS: 2.0000 | INHALATION_SPRAY | RESPIRATORY_TRACT | Status: DC | PRN

## 2023-10-15 MED ORDER — TRAZODONE HCL 50 MG PO TABS
25.0000 mg | ORAL_TABLET | Freq: Every evening | ORAL | Status: DC | PRN
  Administered 2023-10-15: 25 mg via ORAL
  Filled 2023-10-15: qty 1

## 2023-10-15 MED ORDER — PANTOPRAZOLE SODIUM 40 MG PO TBEC: 40.0000 mg | DELAYED_RELEASE_TABLET | Freq: Every day | ORAL | Status: DC

## 2023-10-15 MED ORDER — BRINZOLAMIDE 1 % OP SUSP
1.0000 [drp] | Freq: Every day | OPHTHALMIC | Status: DC
  Administered 2023-10-16: 1 [drp] via OPHTHALMIC
  Filled 2023-10-15: qty 10

## 2023-10-15 MED ORDER — ATORVASTATIN CALCIUM 40 MG PO TABS: 40.0000 mg | ORAL_TABLET | Freq: Every day | ORAL | Status: DC

## 2023-10-15 MED ORDER — FERROUS SULFATE 325 (65 FE) MG PO TABS: 325.0000 mg | ORAL_TABLET | ORAL | Status: DC

## 2023-10-15 MED ORDER — BUSPIRONE HCL 10 MG PO TABS
5.0000 mg | ORAL_TABLET | Freq: Three times a day (TID) | ORAL | Status: DC
  Administered 2023-10-15: 5 mg via ORAL
  Filled 2023-10-15: qty 1

## 2023-10-15 MED ORDER — ASPIRIN 81 MG PO TBEC: 81.0000 mg | DELAYED_RELEASE_TABLET | Freq: Every day | ORAL | Status: DC

## 2023-10-15 MED ORDER — OLANZAPINE 5 MG PO TBDP: 5.0000 mg | ORAL_TABLET | Freq: Once | ORAL | Status: DC

## 2023-10-15 MED ORDER — CLOPIDOGREL BISULFATE 75 MG PO TABS
75.0000 mg | ORAL_TABLET | Freq: Every day | ORAL | Status: DC
  Administered 2023-10-15: 75 mg via ORAL
  Filled 2023-10-15: qty 1

## 2023-10-15 MED ORDER — LATANOPROST 0.005 % OP SOLN
1.0000 [drp] | Freq: Every day | OPHTHALMIC | Status: DC
  Administered 2023-10-16: 1 [drp] via OPHTHALMIC
  Filled 2023-10-15: qty 2.5

## 2023-10-15 NOTE — BH Assessment (Signed)
TTS Consult will be completed by IRIS. IRIS Coordinator will reach out in established secure chat with assessment time and provider name. Thanks

## 2023-10-15 NOTE — ED Notes (Signed)
Patient walks out the room in a gown and asked RN if she could use a Consulting civil engineer; RN realized that patient is waiting for TTS due to SI statements made; RN called for Security to come to wand patient and have belongings removed; In checking patient's belongings patient notifies security that she has two knives and pepper spray in her purses. All weapons secured and patient's belongings placed in locker and in safe-Monique,RN

## 2023-10-15 NOTE — ED Provider Notes (Signed)
[place] EMERGENCY DEPARTMENT AT [facility] Provider Note   CSN: [id number] Arrival date & time: 10/15/23  1134     History  Chief Complaint  Patient presents with   Weakness    Julia Osborne is a 63 y.o. female.  Pt is a 63 yo female with pmhx significant for asthma, fibroids, copd, gerd, anxiety, depression, endometrial cancer, colon cancer s/p partial rsxn, lynch syndrome, and hld.  Pt presents to the ED today with tremors that are making it difficult to eat, sleep and carry on with life.  Pt has been seen several times in the ED and by neurology for sx.  Neurologic work up neg.  Psych f/u has been recommended several times, but it looks like she has not followed up.  Last MRI was on 1/8 and it was normal.  Pt said she has been under a lot of stress.  She said she wants to die.  She did ask me to talk with her son, and I did.  He does not think she's been taking the medications she's supposed to be taking.  He is worried she will hurt herself.       Home Medications Prior to Admission medications   Medication Sig Start Date End Date Taking? Authorizing Provider  albuterol (PROVENTIL HFA;VENTOLIN HFA) 108 (90 Base) MCG/ACT inhaler Inhale 2 puffs into the lungs every 2 (two) hours as needed for wheezing or shortness of breath (cough). 10/17/15  Yes Gelene Mink, NP  albuterol (PROVENTIL) (2.5 MG/3ML) 0.083% nebulizer solution Take 3 mLs (2.5 mg total) by nebulization every 6 (six) hours as needed for wheezing or shortness of breath. 01/16/16  Yes Penland, Novella Olive, MD  aspirin EC 81 MG tablet Take 1 tablet (81 mg total) by mouth daily. Swallow whole. 05/11/23  Yes Lewanda Rife, MD  atorvastatin (LIPITOR) 40 MG tablet Take 1 tablet (40 mg total) by mouth daily. 05/10/23 09/09/24 Yes Lewanda Rife, MD  busPIRone (BUSPAR) 5 MG tablet Take 1 tablet (5 mg total) by mouth 3 (three) times daily. Patient taking differently: Take 5 mg by mouth daily. 10/02/23  Yes Sharman Cheek, MD  clopidogrel (PLAVIX) 75 MG tablet Take 75 mg by mouth daily. 09/14/23  Yes [provider]  escitalopram (LEXAPRO) 20 MG tablet Take 1 tablet (20 mg total) by mouth at bedtime. 05/10/23  Yes Lewanda Rife, MD  ferrous sulfate 325 (65 FE) MG EC tablet Take 325 mg by mouth every other day. 09/14/23 12/13/23 Yes [provider]  fluticasone (FLONASE) 50 MCG/ACT nasal spray Place 1 spray into both nostrils daily. 12/06/22 12/06/23 Yes Pilar Jarvis, MD  gabapentin (NEURONTIN) 100 MG capsule Take 2 capsules (200 mg total) by mouth at bedtime. 05/10/23  Yes Parmar, Daphine Deutscher, MD  Glycerin, PF, (OPTASE COMFORT DRY EYE) 1 % SOLN Place 1 drop into the left eye at bedtime.   Yes [provider]  hydrOXYzine (ATARAX) 25 MG tablet Take 1 tablet (25 mg total) by mouth every 6 (six) hours as needed for anxiety. 05/10/23  Yes Lewanda Rife, MD  latanoprost (XALATAN) 0.005 % ophthalmic solution Place 1 drop into the left eye at bedtime. Patient taking differently: Place 1 drop into both eyes at bedtime. 05/10/23  Yes Lewanda Rife, MD  loratadine (CLARITIN) 10 MG tablet Take 10 mg by mouth daily.   Yes [provider]  LUMIGAN 0.01 % SOLN Place 1 drop into the left eye at bedtime. 06/24/18  Yes [provider]  meclizine (ANTIVERT) 25 MG tablet Take 1 tablet (25 mg total) by mouth 3 (three) times daily as needed for dizziness. 12/06/22  Yes Pilar Jarvis, MD  ondansetron (ZOFRAN-ODT) 4 MG disintegrating tablet Take 4 mg by mouth every 6 (six) hours as needed for nausea or vomiting.   Yes [provider]  pantoprazole (PROTONIX) 40 MG tablet Take 1 tablet (40 mg total) by mouth daily before breakfast. 05/11/23  Yes Lewanda Rife, MD  senna-docusate (SENOKOT-S) 8.6-50 MG tablet Take 2 tablets by mouth 2 (two) times daily. 05/10/23  Yes Lewanda Rife, MD  SIMBRINZA 1-0.2 % SUSP Place 1 drop into the left eye at bedtime. 04/04/19  Yes [provider]  sodium chloride (OCEAN) 0.65 % SOLN nasal spray Place 1 spray into both nostrils as needed for congestion.   Yes [provider]  traZODone (DESYREL) 50 MG tablet Take 0.5 tablets (25 mg total) by mouth at bedtime as needed for sleep. Patient taking differently: Take 25 mg by mouth at bedtime. 05/10/23  Yes Lewanda Rife, MD  mirtazapine (REMERON) 7.5 MG tablet Take 1 tablet (7.5 mg total) by mouth at bedtime. Patient not taking: Reported on 10/06/2023 05/10/23 09/09/24  Lewanda Rife, MD  montelukast (SINGULAIR) 10 MG tablet Take 1 tablet (10 mg total) by mouth daily. Patient not taking: Reported on 10/15/2023 05/10/23   Lewanda Rife, MD  Naphazoline-Pheniramine (ALLERGY EYE OP) Place 1-2 drops into both eyes 2 (two) times daily as needed (ITCHY, WATERY EYES). Patient not taking: Reported on 10/15/2023    [provider]  Plecanatide (TRULANCE) 3 MG TABS Take 1 tablet by mouth daily. Patient not taking: Reported on 08/31/2023 11/20/21   Wyline Mood, MD      Allergies    Codeine    Review of Systems   Review of Systems  Neurological:  Positive for weakness.  Psychiatric/Behavioral:  Positive for suicidal ideas.   All other systems reviewed and are negative.   Physical Exam Updated Vital Signs BP 119/64 (BP Location: Left Arm)   Pulse 62   Temp 98.2 F (36.8 C) (Oral)   Resp 18   Ht 5\' 3"  (1.6 m)   Wt 47.2 kg   SpO2 100%   BMI 18.43 kg/m  Physical Exam Vitals and nursing note reviewed.  Constitutional:      Appearance: Normal appearance.  HENT:     Head: Normocephalic and atraumatic.     Right Ear: External ear normal.     Left Ear: External ear normal.     Nose: Nose normal.     Mouth/Throat:     Mouth: Mucous membranes are dry.  Eyes:     Extraocular Movements: Extraocular movements intact.     Conjunctiva/sclera: Conjunctivae normal.     Pupils: Pupils are equal, round, and reactive to light.  Cardiovascular:     Rate and  Rhythm: Normal rate and regular rhythm.     Pulses: Normal pulses.     Heart sounds: Normal heart sounds.  Pulmonary:     Effort: Pulmonary effort is normal.     Breath sounds: Normal breath sounds.  Abdominal:     General: Abdomen is flat. Bowel sounds are normal.     Palpations: Abdomen is soft.  Musculoskeletal:        General: Normal range of motion.     Cervical back: Normal range of motion and neck supple.  Skin:    General: Skin is warm.     Capillary Refill: Capillary refill  takes less than 2 seconds.  Neurological:     General: No focal deficit present.     Mental Status: She is alert and oriented to person, place, and time.  Psychiatric:        Mood and Affect: Affect is flat.        Thought Content: Thought content includes suicidal ideation.     ED Results / Procedures / Treatments   Labs (all labs ordered are listed, but only abnormal results are displayed) Labs Reviewed  URINALYSIS, W/ REFLEX TO CULTURE (INFECTION SUSPECTED) - Abnormal; Notable for the following components:      Result Value   Hgb urine dipstick SMALL (*)    Leukocytes,Ua TRACE (*)    Bacteria, UA RARE (*)    All other components within normal limits  ACETAMINOPHEN LEVEL - Abnormal; Notable for the following components:   Acetaminophen (Tylenol), Serum <10 (*)    All other components within normal limits  SALICYLATE LEVEL - Abnormal; Notable for the following components:   Salicylate Lvl <7.0 (*)    All other components within normal limits  CBC WITH DIFFERENTIAL/PLATELET  COMPREHENSIVE METABOLIC PANEL  MAGNESIUM  ETHANOL    EKG None  Radiology CT Head Wo Contrast Result Date: 10/15/2023 CLINICAL DATA:  Generalized weakness and tremors. Patient fell yesterday hitting her head. EXAM: CT HEAD WITHOUT CONTRAST TECHNIQUE: Contiguous axial images were obtained from the base of the skull through the vertex without intravenous contrast. RADIATION DOSE REDUCTION: This exam was performed  according to the departmental dose-optimization program which includes automated exposure control, adjustment of the mA and/or kV according to patient size and/or use of iterative reconstruction technique. COMPARISON:  09/29/2023. FINDINGS: Brain: No evidence of acute infarction, hemorrhage, hydrocephalus, extra-axial collection or mass lesion/mass effect. Vascular: No hyperdense vessel or unexpected calcification. Skull: Normal. Negative for fracture or focal lesion. Sinuses/Orbits: Globes and orbits are unremarkable. Sinuses are clear. Other: None. IMPRESSION: Negative unenhanced CT scan of the brain Electronically Signed   By: Amie Portland M.D.   On: 10/15/2023 12:43   DG Chest 2 View Result Date: 10/15/2023 CLINICAL DATA:  Weakness.  Tremor.  Fell yesterday. EXAM: CHEST - 2 VIEW COMPARISON:  None Available. FINDINGS: Cardiac silhouette is normal in size and configuration. Stable right anterior chest wall Port-A-Cath. No mediastinal or hilar masses. No evidence of adenopathy. Lungs are clear.  No pleural effusion or pneumothorax. Skeletal structures are intact. IMPRESSION: No active cardiopulmonary disease. Electronically Signed   By: Amie Portland M.D.   On: 10/15/2023 12:41    Procedures Procedures    Medications Ordered in ED Medications  nicotine (NICODERM CQ - dosed in mg/24 hours) patch 21 mg (21 mg Transdermal Patch Applied 10/15/23 1745)  albuterol (VENTOLIN HFA) 108 (90 Base) MCG/ACT inhaler 2 puff (has no administration in time range)  aspirin EC tablet 81 mg (has no administration in time range)  atorvastatin (LIPITOR) tablet 40 mg (has no administration in time range)  clopidogrel (PLAVIX) tablet 75 mg (75 mg Oral Given 10/15/23 2306)  escitalopram (LEXAPRO) tablet 20 mg (20 mg Oral Given 10/15/23 2306)  ferrous sulfate tablet 325 mg (has no administration in time range)  busPIRone (BUSPAR) tablet 5 mg (5 mg Oral Given 10/15/23 2306)  fluticasone (FLONASE) 50 MCG/ACT nasal spray 1  spray (has no administration in time range)  gabapentin (NEURONTIN) capsule 200 mg (200 mg Oral Given 10/15/23 2306)  hydrOXYzine (ATARAX) tablet 25 mg (25 mg Oral Given 10/15/23 2306)  latanoprost (XALATAN) 0.005 %  ophthalmic solution 1 drop (has no administration in time range)  pantoprazole (PROTONIX) EC tablet 40 mg (has no administration in time range)  traZODone (DESYREL) tablet 25 mg (25 mg Oral Given 10/15/23 2306)  brinzolamide (AZOPT) 1 % ophthalmic suspension 1 drop (has no administration in time range)    And  brimonidine (ALPHAGAN) 0.2 % ophthalmic solution 1 drop (has no administration in time range)    ED Course/ Medical Decision Making/ A&P                                 Medical Decision Making Risk OTC drugs. Prescription drug management.   This patient presents to the ED for concern of suicidal ideation/weakness, this involves an extensive number of treatment options, and is a complaint that carries with it a high risk of complications and morbidity.  The differential diagnosis includes psych, cardiac, neurologic, electrolyte abn   Co morbidities that complicate the patient evaluation  asthma, fibroids, copd, gerd, anxiety, depression, endometrial cancer, colon cancer s/p partial rsxn, lynch syndrome, and hld   Additional history obtained:  Additional history obtained from epic chart review External records from outside source obtained and reviewed including son   Lab Tests:  I Ordered, and personally interpreted labs.  The pertinent results include:  cbc nl, cmp nl, mg 2.2, etoh <10, sal <7, acet <10   Imaging Studies ordered:  I ordered imaging studies including cxr, ct head  I independently visualized and interpreted imaging which showed  CXR: No active cardiopulmonary disease.  CT head: Negative unenhanced CT scan of the brain  I agree with the radiologist interpretation   Medicines ordered and prescription drug management:   I have reviewed  the patients home medicines and have made adjustments as needed   Test Considered:  ct  Consultations Obtained:  I requested consultation with TTS,  and discussed lab and imaging findings as well as pertinent plan -consult pending  Problem List / ED Course:  Weakness:  neurologic etiology has been ruled out.  I think sx are psychiatric in nature.  Pt is now suicidal, so a TTS consult has been ordered.  Pt is currently voluntary, but will need IVC if she tries to leave.   Reevaluation:  After the interventions noted above, I reevaluated the patient and found that they have :improved   Social Determinants of Health:  Lives at home   Dispostion:  After consideration of the diagnostic results and the patients response to treatment, I feel that the patent would benefit from psych consult.          Final Clinical Impression(s) / ED Diagnoses Final diagnoses:  Suicidal ideation    Rx / DC Orders ED Discharge Orders     None         Jacalyn Lefevre, MD 10/15/23 2353

## 2023-10-15 NOTE — ED Notes (Signed)
TTS in process

## 2023-10-15 NOTE — ED Triage Notes (Signed)
Pt BIB  Ems with reports of generalized weakness and tremors. Pt state that she fell yesterday and hit her head. Pt repots unable to eat in 2 days. Pt stating "I want to die, I want to die, I can't live like this."
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
## 2023-10-15 NOTE — ED Provider Triage Note (Signed)
Emergency Medicine Provider Triage Evaluation Note  Julia Osborne , a 63 y.o. female  was evaluated in triage.  Pt complains of ongoing tremors making it difficult for her to eat, sleep and carry on her daily life.  Patient reports repeatedly that she just wants to die because she can no longer live like this..  She does not give any further information  Review of Systems  Positive: Tremors, eating issues, SI Negative: Moving, fever, chest pain or shortness of breath  Physical Exam  BP (!) 145/76 (BP Location: Right Arm)   Pulse 62   Temp 98.6 F (37 C) (Oral)   Resp 18  Gen:   Awake, no distress   Resp:  Normal effort  MSK:   Moves extremities without difficulty  Other:  Intermittent shaking of the upper body, no movement noted in the lower body.  Intermittently has droop on the left side of her face but then will able to speak fully at other times with normal mouth movement  Medical Decision Making  Medically screening exam initiated at 11:51 AM.  Appropriate orders placed.  Julia Osborne was informed that the remainder of the evaluation will be completed by another provider, this initial triage assessment does not replace that evaluation, and the importance of remaining in the ED until their evaluation is complete.     Gwyneth Sprout, MD 10/15/23 1153

## 2023-10-16 ENCOUNTER — Encounter: Payer: Self-pay | Admitting: Psychiatric/Mental Health

## 2023-10-16 ENCOUNTER — Other Ambulatory Visit: Payer: Self-pay

## 2023-10-16 ENCOUNTER — Inpatient Hospital Stay
Admission: AD | Admit: 2023-10-16 | Discharge: 2023-10-22 | DRG: 881 | Disposition: A | Discharge status: Home / Self Care | Payer: Medicare HMO | Source: Intra-hospital | Attending: Psychiatry | Admitting: Psychiatry

## 2023-10-16 DIAGNOSIS — Z91148 Patient's other noncompliance with medication regimen for other reason: Secondary | ICD-10-CM | POA: Diagnosis not present

## 2023-10-16 DIAGNOSIS — Z85038 Personal history of other malignant neoplasm of large intestine: Secondary | ICD-10-CM

## 2023-10-16 DIAGNOSIS — Z833 Family history of diabetes mellitus: Secondary | ICD-10-CM | POA: Diagnosis not present

## 2023-10-16 DIAGNOSIS — Z8542 Personal history of malignant neoplasm of other parts of uterus: Secondary | ICD-10-CM | POA: Diagnosis not present

## 2023-10-16 DIAGNOSIS — E46 Unspecified protein-calorie malnutrition: Secondary | ICD-10-CM | POA: Diagnosis present

## 2023-10-16 DIAGNOSIS — R531 Weakness: Secondary | ICD-10-CM | POA: Diagnosis not present

## 2023-10-16 DIAGNOSIS — F329 Major depressive disorder, single episode, unspecified: Principal | ICD-10-CM | POA: Diagnosis present

## 2023-10-16 DIAGNOSIS — R45851 Suicidal ideations: Secondary | ICD-10-CM | POA: Diagnosis not present

## 2023-10-16 DIAGNOSIS — Z20822 Contact with and (suspected) exposure to covid-19: Secondary | ICD-10-CM | POA: Diagnosis not present

## 2023-10-16 DIAGNOSIS — Z8249 Family history of ischemic heart disease and other diseases of the circulatory system: Secondary | ICD-10-CM | POA: Diagnosis not present

## 2023-10-16 DIAGNOSIS — Z9049 Acquired absence of other specified parts of digestive tract: Secondary | ICD-10-CM | POA: Diagnosis not present

## 2023-10-16 DIAGNOSIS — Z90710 Acquired absence of both cervix and uterus: Secondary | ICD-10-CM | POA: Diagnosis not present

## 2023-10-16 DIAGNOSIS — Z860100 Personal history of colon polyps, unspecified: Secondary | ICD-10-CM | POA: Diagnosis not present

## 2023-10-16 DIAGNOSIS — Z7982 Long term (current) use of aspirin: Secondary | ICD-10-CM

## 2023-10-16 DIAGNOSIS — Z79899 Other long term (current) drug therapy: Secondary | ICD-10-CM

## 2023-10-16 DIAGNOSIS — E785 Hyperlipidemia, unspecified: Secondary | ICD-10-CM | POA: Diagnosis not present

## 2023-10-16 DIAGNOSIS — J449 Chronic obstructive pulmonary disease, unspecified: Secondary | ICD-10-CM | POA: Diagnosis not present

## 2023-10-16 DIAGNOSIS — Z5986 Financial insecurity: Secondary | ICD-10-CM

## 2023-10-16 DIAGNOSIS — Z5982 Transportation insecurity: Secondary | ICD-10-CM | POA: Diagnosis not present

## 2023-10-16 DIAGNOSIS — Z5941 Food insecurity: Secondary | ICD-10-CM

## 2023-10-16 DIAGNOSIS — F17210 Nicotine dependence, cigarettes, uncomplicated: Secondary | ICD-10-CM | POA: Diagnosis not present

## 2023-10-16 DIAGNOSIS — F332 Major depressive disorder, recurrent severe without psychotic features: Secondary | ICD-10-CM

## 2023-10-16 DIAGNOSIS — Z7902 Long term (current) use of antithrombotics/antiplatelets: Secondary | ICD-10-CM | POA: Diagnosis not present

## 2023-10-16 DIAGNOSIS — Z8051 Family history of malignant neoplasm of kidney: Secondary | ICD-10-CM | POA: Diagnosis not present

## 2023-10-16 DIAGNOSIS — Z604 Social exclusion and rejection: Secondary | ICD-10-CM | POA: Diagnosis present

## 2023-10-16 DIAGNOSIS — Z9181 History of falling: Secondary | ICD-10-CM

## 2023-10-16 DIAGNOSIS — Z716 Tobacco abuse counseling: Secondary | ICD-10-CM

## 2023-10-16 DIAGNOSIS — Z823 Family history of stroke: Secondary | ICD-10-CM

## 2023-10-16 DIAGNOSIS — J4489 Other specified chronic obstructive pulmonary disease: Secondary | ICD-10-CM | POA: Diagnosis present

## 2023-10-16 DIAGNOSIS — Z1509 Genetic susceptibility to other malignant neoplasm: Secondary | ICD-10-CM

## 2023-10-16 DIAGNOSIS — Z801 Family history of malignant neoplasm of trachea, bronchus and lung: Secondary | ICD-10-CM

## 2023-10-16 DIAGNOSIS — F419 Anxiety disorder, unspecified: Secondary | ICD-10-CM | POA: Diagnosis not present

## 2023-10-16 DIAGNOSIS — Z808 Family history of malignant neoplasm of other organs or systems: Secondary | ICD-10-CM

## 2023-10-16 DIAGNOSIS — F4310 Post-traumatic stress disorder, unspecified: Secondary | ICD-10-CM | POA: Diagnosis not present

## 2023-10-16 DIAGNOSIS — Z5948 Other specified lack of adequate food: Secondary | ICD-10-CM | POA: Diagnosis not present

## 2023-10-16 DIAGNOSIS — K5900 Constipation, unspecified: Secondary | ICD-10-CM | POA: Diagnosis not present

## 2023-10-16 LAB — SARS CORONAVIRUS 2 BY RT PCR: SARS Coronavirus 2 by RT PCR: NEGATIVE

## 2023-10-16 MED ORDER — DOCUSATE SODIUM 100 MG PO CAPS
100.0000 mg | ORAL_CAPSULE | Freq: Two times a day (BID) | ORAL | Status: DC
  Administered 2023-10-16 – 2023-10-21 (×5): 100 mg via ORAL
  Filled 2023-10-16 (×10): qty 1

## 2023-10-16 MED ORDER — FLUTICASONE PROPIONATE 50 MCG/ACT NA SUSP
1.0000 | Freq: Every day | NASAL | Status: DC
  Administered 2023-10-18 – 2023-10-22 (×5): 1 via NASAL
  Filled 2023-10-16 (×2): qty 16

## 2023-10-16 MED ORDER — GABAPENTIN 100 MG PO CAPS
100.0000 mg | ORAL_CAPSULE | Freq: Every day | ORAL | Status: DC
  Administered 2023-10-16 – 2023-10-22 (×7): 100 mg via ORAL
  Filled 2023-10-16 (×7): qty 1

## 2023-10-16 MED ORDER — ENSURE ENLIVE PO LIQD
237.0000 mL | Freq: Two times a day (BID) | ORAL | Status: DC
  Administered 2023-10-16 – 2023-10-22 (×10): 237 mL via ORAL

## 2023-10-16 MED ORDER — ESCITALOPRAM OXALATE 10 MG PO TABS
20.0000 mg | ORAL_TABLET | Freq: Every day | ORAL | Status: DC
  Administered 2023-10-16 – 2023-10-21 (×6): 20 mg via ORAL
  Filled 2023-10-16 (×6): qty 2

## 2023-10-16 MED ORDER — HYDROXYZINE HCL 25 MG PO TABS
25.0000 mg | ORAL_TABLET | Freq: Four times a day (QID) | ORAL | Status: DC | PRN
  Administered 2023-10-17 – 2023-10-21 (×4): 25 mg via ORAL
  Filled 2023-10-16 (×4): qty 1

## 2023-10-16 MED ORDER — ASPIRIN 81 MG PO TBEC
81.0000 mg | DELAYED_RELEASE_TABLET | Freq: Every day | ORAL | Status: DC
  Administered 2023-10-16 – 2023-10-22 (×7): 81 mg via ORAL
  Filled 2023-10-16 (×6): qty 1

## 2023-10-16 MED ORDER — TRAZODONE HCL 50 MG PO TABS
25.0000 mg | ORAL_TABLET | Freq: Every evening | ORAL | Status: DC | PRN
  Administered 2023-10-17: 25 mg via ORAL
  Filled 2023-10-16: qty 1

## 2023-10-16 MED ORDER — BUSPIRONE HCL 5 MG PO TABS
5.0000 mg | ORAL_TABLET | Freq: Three times a day (TID) | ORAL | Status: DC
  Administered 2023-10-16: 5 mg via ORAL
  Filled 2023-10-16: qty 1

## 2023-10-16 MED ORDER — OLANZAPINE 5 MG PO TBDP: 5.0000 mg | ORAL_TABLET | Freq: Three times a day (TID) | ORAL | Status: DC | PRN

## 2023-10-16 MED ORDER — LATANOPROST 0.005 % OP SOLN
1.0000 [drp] | Freq: Every day | OPHTHALMIC | Status: DC
  Administered 2023-10-16 – 2023-10-21 (×6): 1 [drp] via OPHTHALMIC
  Filled 2023-10-16 (×2): qty 2.5

## 2023-10-16 MED ORDER — BRIMONIDINE TARTRATE 0.2 % OP SOLN
1.0000 [drp] | Freq: Every day | OPHTHALMIC | Status: DC
  Administered 2023-10-16 – 2023-10-21 (×6): 1 [drp] via OPHTHALMIC
  Filled 2023-10-16 (×2): qty 5

## 2023-10-16 MED ORDER — FERROUS SULFATE 325 (65 FE) MG PO TABS
325.0000 mg | ORAL_TABLET | ORAL | Status: DC
  Administered 2023-10-17 – 2023-10-21 (×3): 325 mg via ORAL
  Filled 2023-10-16 (×3): qty 1

## 2023-10-16 MED ORDER — OLANZAPINE 10 MG IM SOLR: 5.0000 mg | Freq: Three times a day (TID) | INTRAMUSCULAR | Status: DC | PRN

## 2023-10-16 MED ORDER — BRINZOLAMIDE 1 % OP SUSP
1.0000 [drp] | Freq: Every day | OPHTHALMIC | Status: DC
  Administered 2023-10-16 – 2023-10-21 (×6): 1 [drp] via OPHTHALMIC
  Filled 2023-10-16 (×2): qty 10

## 2023-10-16 MED ORDER — ALUM & MAG HYDROXIDE-SIMETH 200-200-20 MG/5ML PO SUSP
30.0000 mL | ORAL | Status: DC | PRN
  Administered 2023-10-19: 30 mL via ORAL
  Filled 2023-10-16: qty 30

## 2023-10-16 MED ORDER — ALBUTEROL SULFATE HFA 108 (90 BASE) MCG/ACT IN AERS: 2.0000 | INHALATION_SPRAY | RESPIRATORY_TRACT | Status: DC | PRN

## 2023-10-16 MED ORDER — ATORVASTATIN CALCIUM 10 MG PO TABS
40.0000 mg | ORAL_TABLET | Freq: Every day | ORAL | Status: DC
  Administered 2023-10-16 – 2023-10-22 (×7): 40 mg via ORAL
  Filled 2023-10-16 (×7): qty 4

## 2023-10-16 MED ORDER — ACETAMINOPHEN 325 MG PO TABS
650.0000 mg | ORAL_TABLET | Freq: Four times a day (QID) | ORAL | Status: DC | PRN
  Administered 2023-10-16 – 2023-10-22 (×5): 650 mg via ORAL
  Filled 2023-10-16 (×6): qty 2

## 2023-10-16 MED ORDER — PANTOPRAZOLE SODIUM 40 MG PO TBEC
40.0000 mg | DELAYED_RELEASE_TABLET | Freq: Every day | ORAL | Status: DC
  Administered 2023-10-16 – 2023-10-22 (×7): 40 mg via ORAL
  Filled 2023-10-16 (×7): qty 1

## 2023-10-16 MED ORDER — MAGNESIUM HYDROXIDE 400 MG/5ML PO SUSP: 30.0000 mL | Freq: Every day | ORAL | Status: DC | PRN

## 2023-10-16 MED ORDER — NICOTINE 21 MG/24HR TD PT24
21.0000 mg | MEDICATED_PATCH | Freq: Every day | TRANSDERMAL | Status: DC
  Administered 2023-10-16 – 2023-10-22 (×7): 21 mg via TRANSDERMAL
  Filled 2023-10-16 (×7): qty 1

## 2023-10-16 MED ORDER — GABAPENTIN 100 MG PO CAPS
200.0000 mg | ORAL_CAPSULE | Freq: Every day | ORAL | Status: DC
  Administered 2023-10-16 – 2023-10-21 (×6): 200 mg via ORAL
  Filled 2023-10-16 (×6): qty 2

## 2023-10-16 MED ORDER — CLOPIDOGREL BISULFATE 75 MG PO TABS
75.0000 mg | ORAL_TABLET | Freq: Every day | ORAL | Status: DC
  Administered 2023-10-16 – 2023-10-22 (×7): 75 mg via ORAL
  Filled 2023-10-16 (×7): qty 1

## 2023-10-16 NOTE — Progress Notes (Addendum)
Pt was is accepted at [facility]  10/16/2023 Bed Assignment  PENDING Signed Voluntary consent uploaded to chart or faxed to [facility] Number [phone]  Address: [address] [place], [place], [place] [address]  Pt meets inpatient criteria per Dr. Olivia Mackie. Christell Constant, PhD, MSN, APRN, PMHNP-BC, MCJ Tera Helper, NP Telepsychiatry Consult Services  Attending Physician will be Dr. Callie Fielding  Report can be called to: [phone]  Pt can arrive after: [facility] and [facility] GERO RN to coordinate with care team.  Care Team notified:  [facility] Sharyne Peach, RN Shorbyshawron Davis,RN, Ceasar Mons, RN, [place], [place], Teneka Striblin,LCSW  Maryjean Ka, MSW, [facility] 10/16/2023 2:06 AM

## 2023-10-16 NOTE — Group Note (Signed)
Date:  10/16/2023 Time:  9:29 PM  Group Topic/Focus:  Coping    Participation Level:  Active  Participation Quality:  Appropriate  Affect:  Appropriate  Cognitive:  Appropriate  Insight: Appropriate  Engagement in Group:  Supportive  Modes of Intervention:  Support  Additional Comments:    Lorenda Ishihara 10/16/2023, 9:29 PM

## 2023-10-16 NOTE — H&P (Signed)
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 rhythm.     Pulses: Normal pulses.  Pulmonary:     Effort: Pulmonary effort is normal.  Skin:    General: Skin is warm.  Neurological:     General: No focal deficit present.     Mental Status: She is alert and oriented to person, place, and time.  Review of Systems  Constitutional:  Negative for chills and fever.  HENT:  Negative for hearing loss and sore throat.   Eyes:  Negative for blurred vision and double vision.  Respiratory:  Negative for cough and shortness of breath.   Cardiovascular:  Negative for chest pain and palpitations.  Gastrointestinal:  Negative for nausea and vomiting.  Neurological:  Negative for  dizziness and speech change.  Psychiatric/Behavioral:  Positive for depression and suicidal ideas. The patient is nervous/anxious and has insomnia.    Blood pressure (!) 128/58, pulse 66, temperature (!) 97.2 F (36.2 C), resp. rate 18, height 5\' 3"  (1.6 m), weight 46 kg, SpO2 99%. Body mass index is 17.98 kg/m.  Treatment Plan Summary: Daily contact with patient to assess and evaluate symptoms and progress in treatment and Medication management  Observation Level/Precautions:  15 minute checks  Laboratory:  CBC Chemistry Profile  Psychotherapy:    Medications:    Consultations:    Discharge Concerns:    Estimated LOS: 5-7 days  Other:     Physician Treatment Plan for Primary Diagnosis: MDD (major depressive disorder) Long Term Goal(s): Improvement in symptoms so as ready for discharge  Short Term Goals: Ability to identify changes in lifestyle to reduce recurrence of condition will improve, Ability to verbalize feelings will improve, Ability to disclose and discuss suicidal ideas, Ability to demonstrate self-control will improve, Ability to identify and develop effective coping behaviors will improve, Ability to maintain clinical measurements within normal limits will improve, Compliance with prescribed medications will improve, and Ability to identify triggers associated with substance abuse/mental health issues will improve  1.  Patient is admitted to locked unit under safety precautions 2.  Will continue on home medicines including  -Lexapro 20 mg by mouth daily for depression and anxiety -Gabapentin 100 mg in the morning and 200 mg at bedtime for pain -Trazodone 25 mg at bedtime as needed for insomnia 3.  Will continue home medicine including aspirin, Lipitor, Plavix, ferrous sulfate 4.  Patient is complaining of constipation, will start on Colace 100 mg by mouth twice daily 5.  Patient was encouraged to attend group and work on coping strategies 6.  Social worker consulted to  get collateral and help with a safe discharge plan  I certify that inpatient services furnished can reasonably be expected to improve the patient's condition.    Lewanda Rife, MD 1/25/202511:35 AM
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
## 2023-10-16 NOTE — Plan of Care (Signed)
  Problem: Education: Goal: Knowledge of General Education information will improve Description: Including pain rating scale, medication(s)/side effects and non-pharmacologic comfort measures Outcome: Progressing   Problem: Health Behavior/Discharge Planning: Goal: Ability to manage health-related needs will improve Outcome: Progressing   Problem: Clinical Measurements: Goal: Ability to maintain clinical measurements within normal limits will improve Outcome: Progressing Goal: Will remain free from infection Outcome: Progressing Goal: Diagnostic test results will improve Outcome: Progressing Goal: Respiratory complications will improve Outcome: Progressing Goal: Cardiovascular complication will be avoided Outcome: Progressing   Problem: Activity: Goal: Risk for activity intolerance will decrease Outcome: Progressing   Problem: Nutrition: Goal: Adequate nutrition will be maintained Outcome: Progressing   Problem: Coping: Goal: Level of anxiety will decrease Outcome: Progressing   Problem: Elimination: Goal: Will not experience complications related to bowel motility Outcome: Progressing Goal: Will not experience complications related to urinary retention Outcome: Progressing   Problem: Safety: Goal: Ability to remain free from injury will improve Outcome: Progressing   Problem: Skin Integrity: Goal: Risk for impaired skin integrity will decrease Outcome: Progressing   Problem: Education: Goal: Ability to state activities that reduce stress will improve Outcome: Progressing   Problem: Coping: Goal: Ability to identify and develop effective coping behavior will improve Outcome: Progressing   Problem: Self-Concept: Goal: Ability to identify factors that promote anxiety will improve Outcome: Progressing Goal: Level of anxiety will decrease Outcome: Progressing Goal: Ability to modify response to factors that promote anxiety will improve Outcome: Progressing

## 2023-10-16 NOTE — ED Provider Notes (Signed)
Patient has been accepted at [facility] by Dr. Renato Shin.   [name], MD 10/16/23 [phone]

## 2023-10-16 NOTE — Group Note (Signed)
[facility] LCSW Group Therapy Note    Group Date: 10/16/2023 Start Time: 1400 End Time: 1500  Type of Therapy and Topic:  Group Therapy:  Overcoming Obstacles  Participation Level:  [facility] PARTICIPATION LEVEL: Active  Mood:  Description of Group:   In this group patients will be encouraged to explore what they see as obstacles to their own wellness and recovery. They will be guided to discuss their thoughts, feelings, and behaviors related to these obstacles. The group will process together ways to cope with barriers, with attention given to specific choices patients can make. Each patient will be challenged to identify changes they are motivated to make in order to overcome their obstacles. This group will be process-oriented, with patients participating in exploration of their own experiences as well as giving and receiving support and challenge from other group members.  Therapeutic Goals: 1. Patient will identify personal and current obstacles as they relate to admission. 2. Patient will identify barriers that currently interfere with their wellness or overcoming obstacles.  3. Patient will identify feelings, thought process and behaviors related to these barriers. 4. Patient will identify two changes they are willing to make to overcome these obstacles:    Summary of Patient Progress   Patient was active and participated in discussion regarding coping skills and overcoming obstacles.    Therapeutic Modalities:   Cognitive Behavioral Therapy Solution Focused Therapy Motivational Interviewing Relapse Prevention Therapy   Starleen Arms, LCSW

## 2023-10-16 NOTE — Progress Notes (Signed)
Admission Note:  56 yr Caucasian female who presents Voluntary in no acute physical distress for the treatment of SI and Depression, PTSD. Pt appears flat and depressed. Pt was calm and cooperative with admission process. Pt presents with passive SI and contracts for safety upon admission. Pt denies AVH, however there is noted history of V/H.Pt reports a recent fall and is noted with ecchymosis to arms bilaterally and bruises to right and left hip and she c/o pain to that area. Pt also noted with an implanted port to right chest wall reports a hx of endometrioses Cancer, currently she is not undergoing tx. PT searched and contraband found, and secured by security. POC and unit policies explained and understanding verbalized. Consents obtained. Food and fluids offered,but not accepted. Pt had no additional questions or concerns and she is been maintained on q 15 min rounds.

## 2023-10-16 NOTE — Progress Notes (Signed)
   10/16/23 0728  Psych Admission Type (Psych Patients Only)  Admission Status Voluntary  Psychosocial Assessment  Patient Complaints Anxiety;Depression  Eye Contact Brief  Facial Expression Anxious  Affect Anxious  Speech Logical/coherent  Interaction Assertive  Motor Activity Slow  Appearance/Hygiene In scrubs  Behavior Characteristics Cooperative  Mood Depressed  Thought Process  Coherency WDL  Content WDL  Delusions None reported or observed  Perception WDL  Hallucination None reported or observed  Judgment WDL  Confusion None  Danger to Self  Current suicidal ideation? Denies

## 2023-10-16 NOTE — BHH Suicide Risk Assessment (Signed)
[facility] Admission Suicide Risk Assessment   Nursing information obtained from:  Patient Demographic factors:  Caucasian, Low socioeconomic status Current Mental Status:  Suicidal ideation indicated by patient Loss Factors:  Financial problems / change in socioeconomic status  Risk Reduction Factors:  Sense of responsibility to family  Principal Problem: MDD (major depressive disorder) Diagnosis:  Principal Problem:   MDD (major depressive disorder)  Subjective Data: The 63 yo female patient presents to emergency department for generalized weakness and tremors. Pt state that she fell yesterday and hit her head; she reported feeling suicidal; states she wants to cut her wrist as she is tired of the stress and the way she is living.  Reports feels unsafe to return home   Continued Clinical Symptoms:  Alcohol Use Disorder Identification Test Final Score (AUDIT): 1 The "Alcohol Use Disorders Identification Test", Guidelines for Use in Primary Care, Second Edition.  World Science writer [facility]). Score between 0-7:  no or low risk or alcohol related problems. Score between 8-15:  moderate risk of alcohol related problems. Score between 16-19:  high risk of alcohol related problems. Score 20 or above:  warrants further diagnostic evaluation for alcohol dependence and treatment.   CLINICAL FACTORS:   Depression:   Anhedonia Hopelessness Impulsivity Insomnia Previous Psychiatric Diagnoses and Treatments   Musculoskeletal: Strength & Muscle Tone: within normal limits Gait & Station: normal Patient leans: N/A  Psychiatric Specialty Exam:  Presentation  General Appearance:  Appropriate for Environment  Eye Contact: Fair  Speech: Clear and Coherent  Speech Volume: Decreased  Mood and Affect  Mood: Depressed; Anxious; Hopeless  Affect: Congruent; Restricted   Thought Process  Thought Processes: Coherent; Goal Directed  Descriptions of  Associations:Intact  Orientation:Full (Time, Place and Person)  Thought Content:Abstract Reasoning  History of Schizophrenia/Schizoaffective disorder:No  Duration of Psychotic Symptoms:N/A  Hallucinations:Hallucinations: None  Ideas of Reference:None  Suicidal Thoughts:Suicidal Thoughts: Yes, Active SI Active Intent and/or Plan: With Plan  Homicidal Thoughts:Homicidal Thoughts: No   Sensorium  Memory: Immediate Fair; Recent Fair  Judgment: Impaired  Insight: Shallow   Executive Functions  Concentration: Fair  Attention Span: Fair  Recall: [employer] of Knowledge: Fair  Language: Fair   Psychomotor Activity  Psychomotor Activity: Psychomotor Activity: Decreased   Assets  Assets: Communication Skills; Desire for Improvement; Housing; Financial Resources/Insurance   Sleep  Sleep: Sleep: Poor    Physical Exam: Physical Exam Constitutional:      Appearance: Normal appearance.  HENT:     Head: Normocephalic and atraumatic.     Nose: Nose normal.  Eyes:     Pupils: Pupils are equal, round, and reactive to light.  Cardiovascular:     Rate and Rhythm: Regular rhythm.     Pulses: Normal pulses.  Pulmonary:     Effort: Pulmonary effort is normal.  Skin:    General: Skin is warm.  Neurological:     General: No focal deficit present.     Mental Status: She is alert and oriented to person, place, and time.    Review of Systems  Constitutional:  Negative for chills and fever.  HENT:  Negative for hearing loss and sore throat.   Eyes:  Negative for blurred vision and double vision.  Respiratory:  Negative for cough and shortness of breath.   Cardiovascular:  Negative for chest pain and palpitations.  Gastrointestinal:  Negative for nausea and vomiting.  Neurological:  Negative for dizziness and speech change.  Psychiatric/Behavioral:  Positive for depression and suicidal ideas. The  patient is nervous/anxious and has insomnia.    Blood  pressure (!) 128/58, pulse 66, temperature (!) 97.2 F (36.2 C), resp. rate 18, height 5\' 3"  (1.6 m), weight 46 kg, SpO2 99%. Body mass index is 17.98 kg/m.   COGNITIVE FEATURES THAT CONTRIBUTE TO RISK:  Thought constriction (tunnel vision)    SUICIDE RISK:   Moderate:  Frequent suicidal ideation with limited intensity, and duration, some specificity in terms of plans, no associated intent, good self-control,  PLAN OF CARE: Per H&P  I certify that inpatient services furnished can reasonably be expected to improve the patient's condition.   Lewanda Rife, MD 10/16/2023, 11:32 AM

## 2023-10-17 DIAGNOSIS — F332 Major depressive disorder, recurrent severe without psychotic features: Secondary | ICD-10-CM | POA: Diagnosis not present

## 2023-10-17 DIAGNOSIS — F4310 Post-traumatic stress disorder, unspecified: Secondary | ICD-10-CM | POA: Diagnosis not present

## 2023-10-17 MED ORDER — TRAZODONE HCL 50 MG PO TABS
50.0000 mg | ORAL_TABLET | Freq: Every day | ORAL | Status: DC
  Administered 2023-10-17 – 2023-10-21 (×5): 50 mg via ORAL
  Filled 2023-10-17 (×5): qty 1

## 2023-10-17 MED ORDER — DIPHENHYDRAMINE HCL 25 MG PO CAPS
25.0000 mg | ORAL_CAPSULE | Freq: Four times a day (QID) | ORAL | Status: DC | PRN
  Administered 2023-10-17 – 2023-10-20 (×2): 25 mg via ORAL
  Filled 2023-10-17 (×3): qty 1

## 2023-10-17 NOTE — Progress Notes (Deleted)
  Cardiology Office Note:  .   Date:  10/17/2023  ID:  Julia Osborne, DOB 1961/01/09, MRN [id number] PCP: Ailene Ravel, MD  [facility] Providers Cardiologist:  None { Click to update primary MD,subspecialty MD or APP then REFRESH:1}   History of Present Illness: .   Julia Osborne is a 63 y.o. female with history of *** who presents for the evaluation of *** at the request of ***.    ***{There is no content from the last Narrative History section.}    ROS: All other ROS reviewed and negative. Pertinent positives noted in the HPI.     Studies Reviewed: Marland Kitchen       Physical Exam:   VS:  There were no vitals taken for this visit.   Wt Readings from Last 3 Encounters:  10/15/23 104 lb 0.9 oz (47.2 kg)  10/06/23 104 lb (47.2 kg)  10/02/23 110 lb 3.7 oz (50 kg)    GEN: Well nourished, well developed in no acute distress NECK: No JVD; No carotid bruits CARDIAC: ***RRR, no murmurs, rubs, gallops RESPIRATORY:  Clear to auscultation without rales, wheezing or rhonchi  ABDOMEN: Soft, non-tender, non-distended EXTREMITIES:  No edema; No deformity  ASSESSMENT AND PLAN: .   ***    {Are you ordering a CV Procedure (e.g. stress test, cath, DCCV, TEE, etc)?   Press F2        :[id number]}   Follow-up: No follow-ups on file.  Time Spent with Patient: I have spent a total of *** minutes caring for this patient today face to face, ordering and reviewing labs/tests, reviewing prior records/medical history, examining the patient, establishing an assessment and plan, communicating results/findings to the patient/family, and documenting in the medical record.   Signed, Lenna Gilford. Flora Lipps, MD, [facility]  [facility]  [address], [address] [place], [place] [address] [phone]  1:42 PM

## 2023-10-17 NOTE — Plan of Care (Signed)
  Problem: Education: Goal: Knowledge of General Education information will improve Description: Including pain rating scale, medication(s)/side effects and non-pharmacologic comfort measures Outcome: Progressing   Problem: Health Behavior/Discharge Planning: Goal: Ability to manage health-related needs will improve Outcome: Progressing   Problem: Clinical Measurements: Goal: Ability to maintain clinical measurements within normal limits will improve Outcome: Progressing Goal: Will remain free from infection Outcome: Not Applicable Goal: Diagnostic test results will improve Outcome: Not Applicable Goal: Respiratory complications will improve Outcome: Not Applicable   Problem: Nutrition: Goal: Adequate nutrition will be maintained Outcome: Progressing   Problem: Coping: Goal: Ability to identify and develop effective coping behavior will improve Outcome: Progressing   Problem: Skin Integrity: Goal: Risk for impaired skin integrity will decrease Outcome: Progressing   Problem: Safety: Goal: Ability to remain free from injury will improve Outcome: Progressing

## 2023-10-17 NOTE — Group Note (Signed)
Date:  10/17/2023 Time:  11:38 PM  Group Topic/Focus:  Wrap-Up Group:   The focus of this group is to help patients review their daily goal of treatment and discuss progress on daily workbooks.    Participation Level:  Active  Participation Quality:  Appropriate  Affect:  Appropriate  Cognitive:  Appropriate  Insight: Good  Engagement in Group:  Engaged  Modes of Intervention:  Discussion  Additional Comments:    Maeola Harman 10/17/2023, 11:38 PM

## 2023-10-17 NOTE — Plan of Care (Signed)
  Problem: Education: Goal: Knowledge of General Education information will improve Description: Including pain rating scale, medication(s)/side effects and non-pharmacologic comfort measures Outcome: Progressing   Problem: Health Behavior/Discharge Planning: Goal: Ability to manage health-related needs will improve Outcome: Progressing   Problem: Clinical Measurements: Goal: Ability to maintain clinical measurements within normal limits will improve Outcome: Progressing Goal: Cardiovascular complication will be avoided Outcome: Progressing   Problem: Activity: Goal: Risk for activity intolerance will decrease Outcome: Progressing   Problem: Nutrition: Goal: Adequate nutrition will be maintained Outcome: Progressing   Problem: Coping: Goal: Level of anxiety will decrease Outcome: Progressing   Problem: Elimination: Goal: Will not experience complications related to bowel motility Outcome: Progressing Goal: Will not experience complications related to urinary retention Outcome: Progressing   Problem: Safety: Goal: Ability to remain free from injury will improve Outcome: Progressing   Problem: Skin Integrity: Goal: Risk for impaired skin integrity will decrease Outcome: Progressing   Problem: Education: Goal: Ability to state activities that reduce stress will improve Outcome: Progressing   Problem: Coping: Goal: Ability to identify and develop effective coping behavior will improve Outcome: Progressing   Problem: Self-Concept: Goal: Ability to identify factors that promote anxiety will improve Outcome: Progressing Goal: Level of anxiety will decrease Outcome: Progressing Goal: Ability to modify response to factors that promote anxiety will improve Outcome: Progressing
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
## 2023-10-17 NOTE — Progress Notes (Signed)
   10/17/23 0730  Psych Admission Type (Psych Patients Only)  Admission Status Voluntary  Psychosocial Assessment  Patient Complaints Anxiety  Eye Contact Brief  Facial Expression Anxious  Affect Anxious  Speech Logical/coherent  Interaction Assertive  Motor Activity Slow  Appearance/Hygiene In scrubs  Behavior Characteristics Cooperative  Mood Depressed  Thought Process  Coherency WDL  Content WDL  Delusions None reported or observed  Perception WDL  Hallucination None reported or observed  Judgment WDL  Confusion None  Danger to Self  Current suicidal ideation? Denies

## 2023-10-17 NOTE — Progress Notes (Signed)
Assumed care of patient this pm, she presented A & O x 4, affect  flat, preoccupied and depressed, mood.sociable  Pt out in the milieu, behavior appropriate to situation and was with complaints hip pain, "It have been hurting every since I fell the other day". Pt denied thoughts, plan or intent to harm self or others and made a safety commitment, Pt educated on plan of care, medication regimen and she acknowledged an understanding and was without further complaints or concerns. Pt is maintained on q 15 min rounds.

## 2023-10-17 NOTE — Plan of Care (Signed)
Alert and oriented X4, RA, ambulates independently, denies SI/HI/AVH and pain at this time. Compliant w/med administration and actively participated in group. 15 min safety checks performed. Care, comfort and safety maintained/ongoing.   Problem: Education: Goal: Knowledge of General Education information will improve Description: Including pain rating scale, medication(s)/side effects and non-pharmacologic comfort measures Outcome: Progressing   Problem: Health Behavior/Discharge Planning: Goal: Ability to manage health-related needs will improve Outcome: Progressing   Problem: Clinical Measurements: Goal: Ability to maintain clinical measurements within normal limits will improve Outcome: Progressing Goal: Cardiovascular complication will be avoided Outcome: Progressing   Problem: Activity: Goal: Risk for activity intolerance will decrease Outcome: Progressing   Problem: Nutrition: Goal: Adequate nutrition will be maintained Outcome: Progressing   Problem: Coping: Goal: Level of anxiety will decrease Outcome: Progressing   Problem: Elimination: Goal: Will not experience complications related to bowel motility Outcome: Progressing Goal: Will not experience complications related to urinary retention Outcome: Progressing   Problem: Safety: Goal: Ability to remain free from injury will improve Outcome: Progressing   Problem: Skin Integrity: Goal: Risk for impaired skin integrity will decrease Outcome: Progressing   Problem: Education: Goal: Ability to state activities that reduce stress will improve Outcome: Progressing   Problem: Coping: Goal: Ability to identify and develop effective coping behavior will improve Outcome: Progressing   Problem: Self-Concept: Goal: Ability to identify factors that promote anxiety will improve Outcome: Progressing Goal: Level of anxiety will decrease Outcome: Progressing Goal: Ability to modify response to factors that promote  anxiety will improve Outcome: Progressing

## 2023-10-17 NOTE — Group Note (Signed)
Date:  10/17/2023 Time:  11:20 AM  Group Topic/Focus:  Music Therapy/Coloring Sheet Activity The purpose of this group is to allow patients to listen to soothing music while coloring a positive affirmations coloring sheet    Participation Level:  Active  Participation Quality:  Appropriate  Affect:  Appropriate  Cognitive:  Appropriate  Insight: Appropriate  Engagement in Group:  Engaged  Modes of Intervention:  Activity  Additional Comments:    Marta Antu 10/17/2023, 11:20 AM

## 2023-10-17 NOTE — [facility] Suicide Risk Assessment (Signed)
[facility] INPATIENT:  Family/Significant Other Suicide Prevention Education  Suicide Prevention Education:  Contact Attempts: [place] Commons, son, [phone], has been identified by the patient as the family member/significant other with whom the patient will be residing, and identified as the person(s) who will aid the patient in the event of a mental health crisis.  With written consent from the patient, two attempts were made to provide suicide prevention education, prior to and/or following the patient's discharge.  We were unsuccessful in providing suicide prevention education.  A suicide education pamphlet was given to the patient to share with family/significant other.  Date and time of first attempt: 01/26   Julia Osborne 10/17/2023, 3:55 PM

## 2023-10-18 ENCOUNTER — Ambulatory Visit: Payer: Medicare HMO | Admitting: Cardiovascular Disease

## 2023-10-18 DIAGNOSIS — R42 Dizziness and giddiness: Secondary | ICD-10-CM

## 2023-10-18 MED ORDER — BUSPIRONE HCL 5 MG PO TABS
10.0000 mg | ORAL_TABLET | Freq: Two times a day (BID) | ORAL | Status: DC
  Administered 2023-10-18 – 2023-10-22 (×9): 10 mg via ORAL
  Filled 2023-10-18 (×9): qty 2

## 2023-10-18 MED ORDER — ONDANSETRON 4 MG PO TBDP
4.0000 mg | ORAL_TABLET | Freq: Once | ORAL | Status: AC
  Administered 2023-10-18: 4 mg via ORAL
  Filled 2023-10-18: qty 1

## 2023-10-18 NOTE — [facility] Counselor (Signed)
Pt gave verbal permission to contact her sister, Randie Heinz at [phone].   CSW will contact pt's sister as requested to inform her of pt's admission to hospital.   Reynaldo Minium, MSW, [facility] 10/18/2023 3:52 PM

## 2023-10-18 NOTE — Progress Notes (Signed)
   10/17/23 2136  Psych Admission Type (Psych Patients Only)  Admission Status Voluntary  Psychosocial Assessment  Patient Complaints Anxiety  Eye Contact Brief  Facial Expression Anxious  Affect Anxious  Speech Logical/coherent  Interaction Assertive  Motor Activity Slow  Appearance/Hygiene In scrubs  Behavior Characteristics Cooperative  Mood Depressed  Aggressive Behavior  Effect No apparent injury  Thought Process  Coherency WDL  Content WDL  Delusions None reported or observed  Perception WDL  Hallucination None reported or observed  Judgment WDL  Confusion None  Danger to Self  Current suicidal ideation? Denies  Self-Injurious Behavior No self-injurious ideation or behavior indicators observed or expressed   Agreement Not to Harm Self Yes  Description of Agreement Verbal  Danger to Others  Danger to Others None reported or observed

## 2023-10-18 NOTE — Group Note (Unsigned)
Date:  10/19/2023 Time:  12:46 AM  Group Topic/Focus:  Wrap-Up Group:   The focus of this group is to help patients review their daily goal of treatment and discuss progress on daily workbooks.    Participation Level:  Active  Participation Quality:  Appropriate  Affect:  Appropriate  Cognitive:  Appropriate  Insight: Appropriate  Engagement in Group:  Engaged  Modes of Intervention:  Discussion  Additional Comments:    Maeola Harman 10/19/2023, 12:46 AM

## 2023-10-18 NOTE — [facility] IP Treatment Plan (Signed)
Interdisciplinary Treatment and Diagnostic Plan Update  10/18/2023 Time of Session: 3:00 PM  Julia Osborne MRN: [id number]  Principal Diagnosis: MDD (major depressive disorder)  Secondary Diagnoses: Principal Problem:   MDD (major depressive disorder) Active Problems:   PTSD (post-traumatic stress disorder)   Current Medications:  Current Facility-Administered Medications  Medication Dose Route Frequency Provider Last Rate Last Admin   acetaminophen (TYLENOL) tablet 650 mg  650 mg Oral Q6H PRN Jearld Lesch, NP   650 mg at 10/18/23 1230   albuterol (VENTOLIN HFA) 108 (90 Base) MCG/ACT inhaler 2 puff  2 puff Inhalation Q2H PRN Jearld Lesch, NP       alum & mag hydroxide-simeth (MAALOX/MYLANTA) 200-200-20 MG/5ML suspension 30 mL  30 mL Oral Q4H PRN Jearld Lesch, NP       aspirin EC tablet 81 mg  81 mg Oral Daily Dixon, Rashaun M, NP   81 mg at 10/18/23 1011   atorvastatin (LIPITOR) tablet 40 mg  40 mg Oral Daily Dixon, Rashaun M, NP   40 mg at 10/18/23 1011   brinzolamide (AZOPT) 1 % ophthalmic suspension 1 drop  1 drop Left Eye QHS Dixon, Rashaun M, NP   1 drop at 10/17/23 2136   And   brimonidine (ALPHAGAN) 0.2 % ophthalmic solution 1 drop  1 drop Left Eye QHS Dixon, Rashaun M, NP   1 drop at 10/17/23 2136   busPIRone (BUSPAR) tablet 10 mg  10 mg Oral BID Verner Chol, MD   10 mg at 10/18/23 1435   clopidogrel (PLAVIX) tablet 75 mg  75 mg Oral Daily Lerry Liner M, NP   75 mg at 10/18/23 1011   diphenhydrAMINE (BENADRYL) capsule 25 mg  25 mg Oral Q6H PRN Lewanda Rife, MD   25 mg at 10/17/23 2159   docusate sodium (COLACE) capsule 100 mg  100 mg Oral BID Lewanda Rife, MD   100 mg at 10/17/23 0956   escitalopram (LEXAPRO) tablet 20 mg  20 mg Oral QHS Dixon, Rashaun M, NP   20 mg at 10/17/23 2132   feeding supplement (ENSURE ENLIVE / ENSURE PLUS) liquid 237 mL  237 mL Oral BID BM Lewanda Rife, MD   237 mL at 10/18/23 1020   ferrous sulfate tablet 325 mg  325  mg Oral QODAY Dixon, Rashaun M, NP   325 mg at 10/17/23 0956   fluticasone (FLONASE) 50 MCG/ACT nasal spray 1 spray  1 spray Each Nare Daily Jearld Lesch, NP   1 spray at 10/18/23 1230   gabapentin (NEURONTIN) capsule 100 mg  100 mg Oral Daily Lewanda Rife, MD   100 mg at 10/18/23 1011   gabapentin (NEURONTIN) capsule 200 mg  200 mg Oral QHS Dixon, Rashaun M, NP   200 mg at 10/17/23 2131   hydrOXYzine (ATARAX) tablet 25 mg  25 mg Oral Q6H PRN Jearld Lesch, NP   25 mg at 10/17/23 1647   latanoprost (XALATAN) 0.005 % ophthalmic solution 1 drop  1 drop Left Eye QHS Dixon, Rashaun M, NP   1 drop at 10/17/23 2136   magnesium hydroxide (MILK OF MAGNESIA) suspension 30 mL  30 mL Oral Daily PRN Jearld Lesch, NP       nicotine (NICODERM CQ - dosed in mg/24 hours) patch 21 mg  21 mg Transdermal Daily Dixon, Rashaun M, NP   21 mg at 10/18/23 1010   OLANZapine (ZYPREXA) injection 5 mg  5 mg Intramuscular TID PRN Jearld Lesch,  NP       OLANZapine zydis (ZYPREXA) disintegrating tablet 5 mg  5 mg Oral TID PRN Jearld Lesch, NP       pantoprazole (PROTONIX) EC tablet 40 mg  40 mg Oral QAC breakfast Durwin Nora, Rashaun M, NP   40 mg at 10/18/23 1011   traZODone (DESYREL) tablet 50 mg  50 mg Oral QHS Lewanda Rife, MD   50 mg at 10/17/23 2132   PTA Medications: Medications Prior to Admission  Medication Sig Dispense Refill Last Dose/Taking   albuterol (PROVENTIL HFA;VENTOLIN HFA) 108 (90 Base) MCG/ACT inhaler Inhale 2 puffs into the lungs every 2 (two) hours as needed for wheezing or shortness of breath (cough). 1 Inhaler 3    albuterol (PROVENTIL) (2.5 MG/3ML) 0.083% nebulizer solution Take 3 mLs (2.5 mg total) by nebulization every 6 (six) hours as needed for wheezing or shortness of breath. 75 mL 12    aspirin EC 81 MG tablet Take 1 tablet (81 mg total) by mouth daily. Swallow whole. 30 tablet 12    atorvastatin (LIPITOR) 40 MG tablet Take 1 tablet (40 mg total) by mouth daily. 30 tablet 0     busPIRone (BUSPAR) 5 MG tablet Take 1 tablet (5 mg total) by mouth 3 (three) times daily. (Patient taking differently: Take 5 mg by mouth daily.) 60 tablet 0    clopidogrel (PLAVIX) 75 MG tablet Take 75 mg by mouth daily.      escitalopram (LEXAPRO) 20 MG tablet Take 1 tablet (20 mg total) by mouth at bedtime. 30 tablet 0    ferrous sulfate 325 (65 FE) MG EC tablet Take 325 mg by mouth every other day.      fluticasone (FLONASE) 50 MCG/ACT nasal spray Place 1 spray into both nostrils daily. 1 g 0    gabapentin (NEURONTIN) 100 MG capsule Take 2 capsules (200 mg total) by mouth at bedtime. 60 capsule 0    Glycerin, PF, (OPTASE COMFORT DRY EYE) 1 % SOLN Place 1 drop into the left eye at bedtime.      hydrOXYzine (ATARAX) 25 MG tablet Take 1 tablet (25 mg total) by mouth every 6 (six) hours as needed for anxiety. 30 tablet 0    latanoprost (XALATAN) 0.005 % ophthalmic solution Place 1 drop into the left eye at bedtime. (Patient taking differently: Place 1 drop into both eyes at bedtime.) 2.5 mL 12    loratadine (CLARITIN) 10 MG tablet Take 10 mg by mouth daily.      LUMIGAN 0.01 % SOLN Place 1 drop into the left eye at bedtime.      meclizine (ANTIVERT) 25 MG tablet Take 1 tablet (25 mg total) by mouth 3 (three) times daily as needed for dizziness. 30 tablet 0    mirtazapine (REMERON) 7.5 MG tablet Take 1 tablet (7.5 mg total) by mouth at bedtime. (Patient not taking: Reported on 10/06/2023) 30 tablet 0    montelukast (SINGULAIR) 10 MG tablet Take 1 tablet (10 mg total) by mouth daily. (Patient not taking: Reported on 10/15/2023) 30 tablet 0    Naphazoline-Pheniramine (ALLERGY EYE OP) Place 1-2 drops into both eyes 2 (two) times daily as needed (ITCHY, WATERY EYES). (Patient not taking: Reported on 10/15/2023)      ondansetron (ZOFRAN-ODT) 4 MG disintegrating tablet Take 4 mg by mouth every 6 (six) hours as needed for nausea or vomiting.      pantoprazole (PROTONIX) 40 MG tablet Take 1 tablet (40 mg  total) by mouth daily before  breakfast. 15 tablet 0    Plecanatide (TRULANCE) 3 MG TABS Take 1 tablet by mouth daily. (Patient not taking: Reported on 08/31/2023) 30 tablet 6    senna-docusate (SENOKOT-S) 8.6-50 MG tablet Take 2 tablets by mouth 2 (two) times daily. 30 tablet 0    SIMBRINZA 1-0.2 % SUSP Place 1 drop into the left eye at bedtime.      sodium chloride (OCEAN) 0.65 % SOLN nasal spray Place 1 spray into both nostrils as needed for congestion.      traZODone (DESYREL) 50 MG tablet Take 0.5 tablets (25 mg total) by mouth at bedtime as needed for sleep. (Patient taking differently: Take 25 mg by mouth at bedtime.) 30 tablet 0     Patient Stressors:    Patient Strengths:    Treatment Modalities: Medication Management, Group therapy, Case management,  1 to 1 session with clinician, Psychoeducation, Recreational therapy.   Physician Treatment Plan for Primary Diagnosis: MDD (major depressive disorder) Long Term Goal(s): Improvement in symptoms so as ready for discharge   Short Term Goals: Ability to identify changes in lifestyle to reduce recurrence of condition will improve Ability to verbalize feelings will improve Ability to disclose and discuss suicidal ideas Ability to demonstrate self-control will improve Ability to identify and develop effective coping behaviors will improve Ability to maintain clinical measurements within normal limits will improve Compliance with prescribed medications will improve Ability to identify triggers associated with substance abuse/mental health issues will improve  Medication Management: Evaluate patient's response, side effects, and tolerance of medication regimen.  Therapeutic Interventions: 1 to 1 sessions, Unit Group sessions and Medication administration.  Evaluation of Outcomes: Progressing  Physician Treatment Plan for Secondary Diagnosis: Principal Problem:   MDD (major depressive disorder) Active Problems:   PTSD  (post-traumatic stress disorder)  Long Term Goal(s): Improvement in symptoms so as ready for discharge   Short Term Goals: Ability to identify changes in lifestyle to reduce recurrence of condition will improve Ability to verbalize feelings will improve Ability to disclose and discuss suicidal ideas Ability to demonstrate self-control will improve Ability to identify and develop effective coping behaviors will improve Ability to maintain clinical measurements within normal limits will improve Compliance with prescribed medications will improve Ability to identify triggers associated with substance abuse/mental health issues will improve     Medication Management: Evaluate patient's response, side effects, and tolerance of medication regimen.  Therapeutic Interventions: 1 to 1 sessions, Unit Group sessions and Medication administration.  Evaluation of Outcomes: Progressing   RN Treatment Plan for Primary Diagnosis: MDD (major depressive disorder) Long Term Goal(s): Knowledge of disease and therapeutic regimen to maintain health will improve  Short Term Goals: Ability to remain free from injury will improve, Ability to verbalize frustration and anger appropriately will improve, Ability to demonstrate self-control, Ability to participate in decision making will improve, Ability to verbalize feelings will improve, Ability to disclose and discuss suicidal ideas, Ability to identify and develop effective coping behaviors will improve, and Compliance with prescribed medications will improve  Medication Management: RN will administer medications as ordered by provider, will assess and evaluate patient's response and provide education to patient for prescribed medication. RN will report any adverse and/or side effects to prescribing provider.  Therapeutic Interventions: 1 on 1 counseling sessions, Psychoeducation, Medication administration, Evaluate responses to treatment, Monitor vital signs and  CBGs as ordered, Perform/monitor CIWA, COWS, AIMS and Fall Risk screenings as ordered, Perform wound care treatments as ordered.  Evaluation of Outcomes: Progressing  LCSW Treatment Plan for Primary Diagnosis: MDD (major depressive disorder) Long Term Goal(s): Safe transition to appropriate next level of care at discharge, Engage patient in therapeutic group addressing interpersonal concerns.  Short Term Goals: Engage patient in aftercare planning with referrals and resources, Increase social support, Increase ability to appropriately verbalize feelings, Increase emotional regulation, Facilitate acceptance of mental health diagnosis and concerns, Facilitate patient progression through stages of change regarding substance use diagnoses and concerns, Identify triggers associated with mental health/substance abuse issues, and Increase skills for wellness and recovery  Therapeutic Interventions: Assess for all discharge needs, 1 to 1 time with Social worker, Explore available resources and support systems, Assess for adequacy in community support network, Educate family and significant other(s) on suicide prevention, Complete Psychosocial Assessment, Interpersonal group therapy.  Evaluation of Outcomes: Progressing   Progress in Treatment: Attending groups: Yes. and No. Participating in groups: Yes. and No. Taking medication as prescribed: Yes. Toleration medication: Yes. Family/Significant other contact made: No, will contact:  CSW will contact if given permission  Patient understands diagnosis: Yes. Discussing patient identified problems/goals with staff: Yes. Medical problems stabilized or resolved: Yes. Denies suicidal/homicidal ideation: Yes. Issues/concerns per patient self-inventory: No. Other: None   New problem(s) identified: No, Describe:  None identified   New Short Term/Long Term Goal(s):  elimination of symptoms of psychosis, medication management for mood stabilization;  elimination of SI thoughts; development of comprehensive mental wellness plan.   Patient Goals:  "To get healthy again, to get stronger and get out of here"   Discharge Plan or Barriers: CSW will assist with appropriate discharge planning   Reason for Continuation of Hospitalization: Depression Medication stabilization Suicidal ideation  Estimated Length of Stay: 1 to 7 days   Last 3 [place] Suicide Severity Risk Score: Flowsheet Row Admission (Current) from 10/16/2023 in [facility] ED from 10/15/2023 in [facility] Emergency Department at [facility] ED from 10/02/2023 in [facility] Department at [facility]  C-SSRS RISK CATEGORY High Risk No Risk No Risk       Last PHQ 2/9 Scores:    12/07/2022   10:35 AM  Depression screen PHQ 2/9  Decreased Interest 2  Down, Depressed, Hopeless 2  PHQ - 2 Score 4  Altered sleeping 1  Tired, decreased energy 3  Change in appetite 1  Feeling bad or failure about yourself  1  Trouble concentrating 1  Moving slowly or fidgety/restless 0  Suicidal thoughts 0  PHQ-9 Score 11  Difficult doing work/chores Somewhat difficult    Scribe for Treatment Team: Laretta Alstrom 10/18/2023 3:48 PM

## 2023-10-18 NOTE — Progress Notes (Signed)
   10/18/23 0602  15 Minute Checks  Location Bedroom  Visual Appearance Calm  Behavior Composed  Sleep (Behavioral Health Patients Only)  Calculate sleep? (Click Yes once per 24 hr at 0600 safety check) Yes  Documented sleep last 24 hours 8.5

## 2023-10-18 NOTE — Progress Notes (Signed)
 Patient is pleasant and cooperative with staff.  Endorses anxiety and depression.  Denies SI/HI and AVH.  Denies pain.    Compliant with scheduled medications.  15 min checks in place for safety.  Patient is present in the milieu.  Appropriate interaction with peers and staff.

## 2023-10-18 NOTE — Progress Notes (Signed)
[facility] MD Progress Note  10/18/2023 1:39 PM Julia Osborne  MRN:  [id number]  Patient is a  63 yo female who presented to emergency department for generalized weakness and tremors. Pt state that she fell yesterday and hit her head; she reported feeling suicidal; states she wants to cut her wrist as she is tired of the stress and the way she is living. Reports feels unsafe to return home  Subjective:  Chart reviewed, case discussed in multidisciplinary meeting, patient seen during rounds.  The main reason for her admission is feeling depressed and having thoughts of cutting her wrist.  She talks about her son living with her and her son taking up her call or and more resources.  She rates her anxiety as 6 out of 10,10 being the worst and depression is 5 out of 10,10 being the worst.  She reports the reason for her anxiety is she is in pain today and feels tired.  She reports that she needs shoes and walking with the socks on is hurting her feet.  She denies current auditory/visual hallucinations.  She denies SI/HI/intent/plan.  She reports that at home she takes BuSpar regularly and she is not aware of the reason that it was discontinued since her admission.  This provider reviewed the chart and it looks like there was no specific reason for not starting BuSpar.  Will add BuSpar to her medication regimen.  Patient also requested for modified diet with Magic cup to help with her malnourishment.   Sleep: Fair  Appetite:  Fair  Past Psychiatric History: see h&P Family History:  Family History  Problem Relation Age of Onset   Other Mother        clot that went to heart, deceased age 12ss   Heart attack Father        age 52s, deceased   Stroke Father    Cancer Father        "at death determined he was ate up with cancer"   Hypertension Sister    Kidney cancer Sister 68   Diabetes Brother    Hypertension Brother    Endometriosis Daughter    Heart attack Maternal Grandfather    Diabetes Sister     Brain cancer Paternal Aunt    Cancer Paternal Uncle        NOS   Cancer Paternal Uncle        NOS   Cancer Paternal Uncle        NOS   Colon cancer Neg Hx    Social History:  Social History   Substance and Sexual Activity  Alcohol Use No     Social History   Substance and Sexual Activity  Drug Use No    Social History   Socioeconomic History   Marital status: Widowed    Spouse name: Not on file   Number of children: 2   Years of education: Not on file   Highest education level: Not on file  Occupational History   Not on file  Tobacco Use   Smoking status: Never   Smokeless tobacco: Never  Vaping Use   Vaping status: Never Used  Substance and Sexual Activity   Alcohol use: No   Drug use: No   Sexual activity: Not Currently    Birth control/protection: Surgical    Comment: hyst  Other Topics Concern   Not on file  Social History Narrative   ** Merged History Encounter **       **  Data from: 04/28/23 Enc Dept: [facility]-PRE ADM TESTING   #  [place]; smoking- 1/2 ppd; no alcohol; used to run machines;   FHx-Dad- MI; ? Cancer on autopsy; sisters/aunts- brain; lung cancer x2; oldest sister- kidney cancer  Pt has 32 year old son;daughter 33 years. Brothers- states to have s   poken to her family re: importance of them being checked for lynch.        ** Data from: 05/04/23 Enc Dept: [facility]   Lives alone with 4 large dogs.    Social Drivers of Health   Financial Resource Strain: Medium Risk (12/07/2022)   Overall Financial Resource Strain (CARDIA)    Difficulty of Paying Living Expenses: Somewhat hard  Food Insecurity: Food Insecurity Present (10/16/2023)   Hunger Vital Sign    Worried About Running Out of Food in the Last Year: Sometimes true    Ran Out of Food in the Last Year: Sometimes true  Transportation Needs: Unmet Transportation Needs (10/16/2023)   PRAPARE - Administrator, Civil Service (Medical): Yes    Lack of  Transportation (Non-Medical): Yes  Physical Activity: Inactive (12/07/2022)   Exercise Vital Sign    Days of Exercise per Week: 0 days    Minutes of Exercise per Session: 0 min  Stress: Stress Concern Present (12/07/2022)   [employer] of Occupational Health - Occupational Stress Questionnaire    Feeling of Stress : To some extent  Social Connections: Socially Isolated (10/16/2023)   Social Connection and Isolation Panel [NHANES]    Frequency of Communication with Friends and Family: More than three times a week    Frequency of Social Gatherings with Friends and Family: More than three times a week    Attends Religious Services: Never    Database administrator or Organizations: No    Attends Banker Meetings: Never    Marital Status: Widowed   Past Medical History:  Past Medical History:  Diagnosis Date   Abdominal pain, acute, left upper quadrant    Abdominal pain, epigastric    Abnormal CT scan, sigmoid colon    Abnormal vaginal Pap smear    Adenocarcinoma of colon (HCC) 09/13/2015   Partial colon resection and chemo tx's.    Anemia    Anxiety    Anxiety disorder, unspecified    Aphasia    Asthma    Cancer (HCC)    endometrial; cancer cells in intestine   Chronic pain of left knee    Colon neoplasm    Constipation    Conversion reaction    COPD (chronic obstructive pulmonary disease) (HCC)    no definite diagnosis   Depression    Diverticulitis    Diverticulosis of colon without hemorrhage    Dyspareunia 05/16/2015   Dysphasia    Endometrial cancer (HCC)    Esophageal dysphagia    Family history of cancer    Family history of kidney cancer    Ganglion of left knee    Gastric intestinal metaplasia    GERD (gastroesophageal reflux disease)    Glaucoma    Hematochezia    History of colonic polyps    Hyperlipidemia    Indigestion    Lynch syndrome    Mucosal abnormality of stomach    Nausea and vomiting    Neuropathy    feet and hands    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 drug (HCC)    PONV (postoperative nausea and vomiting)    Pre-diabetes    Primary  osteoarthritis of left knee    Rectal bleeding    Reflux esophagitis    Soft tissue swelling of knee joint    Uterine fibroid    UTI (urinary tract infection) 04/26/2023   on cipro   Vaginal dryness 05/16/2015   Vaginal itching    Vaginal Pap smear, abnormal     Past Surgical History:  Procedure Laterality Date   ABDOMINAL HYSTERECTOMY     APPENDECTOMY     BIOPSY N/A 03/14/2015   Procedure: BIOPSY;  Surgeon: Corbin Ade, MD;  Location: [facility];  Service: Endoscopy;  Laterality: N/A;  Gastric   COLONOSCOPY N/A 01/28/2017   Procedure: COLONOSCOPY;  Surgeon: Corbin Ade, MD;  Location: [facility];  Service: Endoscopy;  Laterality: N/A;  11:30am   COLONOSCOPY WITH PROPOFOL N/A 03/14/2015   RMR: Internal hemorrhoids. colonic diverticulosis. Incomplete examination. Prepartation inadequate.   COLONOSCOPY WITH PROPOFOL N/A 07/04/2015   RMR: Colonic diverticulosis . Large polypoid lesion in the vicinity of the hepatic flexure status post saline-assisted piecmeal snare polypectomy  with ablation and tattooing as described. Sigmoid polyp removed as described above. sigmoid colon polyp hyperplastic, hepatic flexure polyp with TA with focal high grade dysplasia    COLONOSCOPY WITH PROPOFOL N/A 11/03/2018   Procedure: COLONOSCOPY WITH PROPOFOL;  Surgeon: Wyline Mood, MD;  Location: [facility] ENDOSCOPY;  Service: Gastroenterology;  Laterality: N/A;   COLONOSCOPY WITH PROPOFOL N/A 12/09/2020   Procedure: COLONOSCOPY WITH PROPOFOL;  Surgeon: Wyline Mood, MD;  Location: [facility] ENDOSCOPY;  Service: Gastroenterology;  Laterality: N/A;  COVID POSITIVE 10/30/2020   COLONOSCOPY WITH PROPOFOL N/A 01/20/2021   Procedure: COLONOSCOPY WITH PROPOFOL;  Surgeon: Wyline Mood, MD;  Location: [facility] ENDOSCOPY;  Service: Gastroenterology;  Laterality: N/A;   COLONOSCOPY WITH PROPOFOL N/A 12/10/2021    Procedure: COLONOSCOPY WITH PROPOFOL;  Surgeon: Wyline Mood, MD;  Location: [facility] ENDOSCOPY;  Service: Gastroenterology;  Laterality: N/A;   COLONOSCOPY WITH PROPOFOL N/A 02/16/2023   Procedure: COLONOSCOPY WITH PROPOFOL;  Surgeon: Wyline Mood, MD;  Location: [facility] ENDOSCOPY;  Service: Gastroenterology;  Laterality: N/A;   ENTEROSCOPY N/A 02/12/2020   Procedure: ENTEROSCOPY;  Surgeon: Wyline Mood, MD;  Location: [facility] ENDOSCOPY;  Service: Gastroenterology;  Laterality: N/A;   ESOPHAGEAL DILATION N/A 03/14/2015   Procedure: ESOPHAGEAL DILATION;  Surgeon: Corbin Ade, MD;  Location: [facility];  Service: Endoscopy;  Laterality: N/AElease Hashimoto 54   ESOPHAGOGASTRODUODENOSCOPY N/A 05/19/2016   Procedure: ESOPHAGOGASTRODUODENOSCOPY (EGD);  Surgeon: Corbin Ade, MD;  [facility];  Service: Endoscopy;  Laterality: N/A;  215   ESOPHAGOGASTRODUODENOSCOPY N/A 01/28/2017   Procedure: ESOPHAGOGASTRODUODENOSCOPY (EGD);  Surgeon: Corbin Ade, MD;  [facility];  Service: Endoscopy;  Laterality: N/A;   ESOPHAGOGASTRODUODENOSCOPY N/A 12/10/2021   Procedure: ESOPHAGOGASTRODUODENOSCOPY (EGD);  Surgeon: Wyline Mood, MD;  Location: [facility] ENDOSCOPY;  Service: Gastroenterology;  Laterality: N/A;   ESOPHAGOGASTRODUODENOSCOPY (EGD) WITH PROPOFOL N/A 03/14/2015   RMR: Mild erosive reflux esophagitis status post passage o f a Maloney dilator. Abnormal gastric mucosa of uncertain significance as described above. status post biopsy, benign   ESOPHAGOGASTRODUODENOSCOPY (EGD) WITH PROPOFOL N/A 11/18/2017   Procedure: ESOPHAGOGASTRODUODENOSCOPY (EGD) WITH PROPOFOL;  Surgeon: Corbin Ade, MD;  [facility];  Service: Endoscopy;  Laterality: N/A;  12:15pm   ESOPHAGOGASTRODUODENOSCOPY (EGD) WITH PROPOFOL N/A 11/03/2018   Procedure: ESOPHAGOGASTRODUODENOSCOPY (EGD) WITH PROPOFOL;  Surgeon: Wyline Mood, MD;  Location: [facility] ENDOSCOPY;  Service: Gastroenterology;  Laterality: N/A;    ESOPHAGOGASTRODUODENOSCOPY (EGD) WITH PROPOFOL N/A 02/16/2023   Procedure:  ESOPHAGOGASTRODUODENOSCOPY (EGD) WITH PROPOFOL;  Surgeon: Wyline Mood, MD;  Location[facility] ENDOSCOPY;  Service: Gastroenterology;  Laterality: N/A;   GIVENS CAPSULE STUDY N/A 12/25/2019   Procedure: GIVENS CAPSULE STUDY;  Surgeon: Wyline Mood, MD;  Location: [facility] ENDOSCOPY;  Service: Gastroenterology;  Laterality: N/A;   MALONEY DILATION N/A 05/19/2016   Procedure: [name] DILATION;  Surgeon: Corbin Ade, MD;  [facility];  Service: Endoscopy;  Laterality: N/A;   MALONEY DILATION N/A 01/28/2017   Procedure: [name] DILATION;  Surgeon: Corbin Ade, MD;  [facility];  Service: Endoscopy;  Laterality: N/A;   MALONEY DILATION N/A 11/18/2017   Procedure: [name] DILATION;  Surgeon: Corbin Ade, MD;  [facility];  Service: Endoscopy;  Laterality: N/A;   PARTIAL COLECTOMY  08/30/2015   polyp with adenocarcinoma   PARTIAL COLECTOMY  08/29/2018   POLYPECTOMY N/A 07/04/2015   Procedure: POLYPECTOMY;  Surgeon: Corbin Ade, MD;  Location: [facility];  Service: Endoscopy;  Laterality: N/A;   PORTACATH PLACEMENT Right 09/21/2014   TUBAL LIGATION      Current Medications: Current Facility-Administered Medications  Medication Dose Route Frequency Provider Last Rate Last Admin   acetaminophen (TYLENOL) tablet 650 mg  650 mg Oral Q6H PRN Jearld Lesch, NP   650 mg at 10/18/23 1230   albuterol (VENTOLIN HFA) 108 (90 Base) MCG/ACT inhaler 2 puff  2 puff Inhalation Q2H PRN Dixon, Rashaun M, NP       alum & mag hydroxide-simeth (MAALOX/MYLANTA) 200-200-20 MG/5ML suspension 30 mL  30 mL Oral Q4H PRN Jearld Lesch, NP       aspirin EC tablet 81 mg  81 mg Oral Daily Dixon, Rashaun M, NP   81 mg at 10/18/23 1011   atorvastatin (LIPITOR) tablet 40 mg  40 mg Oral Daily Dixon, Rashaun M, NP   40 mg at 10/18/23 1011   brinzolamide (AZOPT) 1 % ophthalmic suspension 1 drop  1 drop Left Eye QHS  Dixon, Rashaun M, NP   1 drop at 10/17/23 2136   And   brimonidine (ALPHAGAN) 0.2 % ophthalmic solution 1 drop  1 drop Left Eye QHS Dixon, Rashaun M, NP   1 drop at 10/17/23 2136   busPIRone (BUSPAR) tablet 10 mg  10 mg Oral BID Verner Chol, MD       clopidogrel (PLAVIX) tablet 75 mg  75 mg Oral Daily Dixon, Rashaun M, NP   75 mg at 10/18/23 1011   diphenhydrAMINE (BENADRYL) capsule 25 mg  25 mg Oral Q6H PRN Lewanda Rife, MD   25 mg at 10/17/23 2159   docusate sodium (COLACE) capsule 100 mg  100 mg Oral BID Lewanda Rife, MD   100 mg at 10/17/23 0956   escitalopram (LEXAPRO) tablet 20 mg  20 mg Oral QHS Dixon, Rashaun M, NP   20 mg at 10/17/23 2132   feeding supplement (ENSURE ENLIVE / ENSURE PLUS) liquid 237 mL  237 mL Oral BID BM Lewanda Rife, MD   237 mL at 10/18/23 1020   ferrous sulfate tablet 325 mg  325 mg Oral QODAY Dixon, Rashaun M, NP   325 mg at 10/17/23 0956   fluticasone (FLONASE) 50 MCG/ACT nasal spray 1 spray  1 spray Each Nare Daily Jearld Lesch, NP   1 spray at 10/18/23 1230   gabapentin (NEURONTIN) capsule 100 mg  100 mg Oral Daily Lewanda Rife, MD   100 mg at 10/18/23 1011   gabapentin (NEURONTIN) capsule 200  mg  200 mg Oral QHS Dixon, Rashaun M, NP   200 mg at 10/17/23 2131   hydrOXYzine (ATARAX) tablet 25 mg  25 mg Oral Q6H PRN Jearld Lesch, NP   25 mg at 10/17/23 1647   latanoprost (XALATAN) 0.005 % ophthalmic solution 1 drop  1 drop Left Eye QHS Jearld Lesch, NP   1 drop at 10/17/23 2136   magnesium hydroxide (MILK OF MAGNESIA) suspension 30 mL  30 mL Oral Daily PRN Jearld Lesch, NP       nicotine (NICODERM CQ - dosed in mg/24 hours) patch 21 mg  21 mg Transdermal Daily Dixon, Rashaun M, NP   21 mg at 10/18/23 1010   OLANZapine (ZYPREXA) injection 5 mg  5 mg Intramuscular TID PRN Jearld Lesch, NP       OLANZapine zydis (ZYPREXA) disintegrating tablet 5 mg  5 mg Oral TID PRN Jearld Lesch, NP       pantoprazole (PROTONIX) EC  tablet 40 mg  40 mg Oral QAC breakfast Dixon, Rashaun M, NP   40 mg at 10/18/23 1011   traZODone (DESYREL) tablet 50 mg  50 mg Oral QHS Lewanda Rife, MD   50 mg at 10/17/23 2132    Lab Results: No results found for this or any previous visit (from the past 48 hours).  Blood Alcohol level:  Lab Results  Component Value Date   ETH <10 10/15/2023   ETH <10 09/29/2023    Metabolic Disorder Labs: Lab Results  Component Value Date   HGBA1C 5.9 (H) 05/03/2023   MPG 123 05/03/2023   No results found for: "PROLACTIN" Lab Results  Component Value Date   CHOL 149 05/03/2023   TRIG 66 05/03/2023   HDL 49 05/03/2023   CHOLHDL 3.0 05/03/2023   VLDL 13 05/03/2023   LDLCALC 87 05/03/2023    Physical Findings: AIMS:  , ,  ,  ,    CIWA:    COWS:      Psychiatric Specialty Exam:  Presentation  General Appearance:  Appropriate for Environment; Casual  Eye Contact: Fair  Speech: Clear and Coherent  Speech Volume: Normal    Mood and Affect  Mood: Depressed; Anxious  Affect: Depressed   Thought Process  Thought Processes: Coherent  Descriptions of Associations:Intact  Orientation:Full (Time, Place and Person)  Thought Content:Logical  Hallucinations:Hallucinations: None  Ideas of Reference:None  Suicidal Thoughts:Suicidal Thoughts: No  Homicidal Thoughts:Homicidal Thoughts: No   Sensorium  Memory: Immediate Fair; Recent Fair  Judgment: Impaired  Insight: Shallow   Executive Functions  Concentration: Poor  Attention Span: Fair  Recall: [employer] of Knowledge: Fair  Language: Fair   Psychomotor Activity  Psychomotor Activity: Psychomotor Activity: Normal  Musculoskeletal: Strength & Muscle Tone: within normal limits Gait & Station: normal Assets  Assets: Manufacturing systems engineer; Desire for Improvement; Physical Health    Physical Exam: Physical Exam Vitals and nursing note reviewed.  HENT:     Head: Normocephalic.      Nose: Nose normal.     Mouth/Throat:     Mouth: Mucous membranes are moist.  Eyes:     Pupils: Pupils are equal, round, and reactive to light.  Cardiovascular:     Rate and Rhythm: Normal rate.     Pulses: Normal pulses.  Pulmonary:     Effort: Pulmonary effort is normal.  Abdominal:     General: Bowel sounds are normal.  Skin:    General: Skin is warm.  Neurological:  General: No focal deficit present.     Mental Status: She is alert.    Review of Systems  Constitutional: Negative.   HENT: Negative.    Eyes: Negative.   Respiratory: Negative.    Cardiovascular: Negative.   Gastrointestinal: Negative.   Skin: Negative.   Neurological: Negative.    Blood pressure (!) 104/57, pulse 64, temperature 98 F (36.7 C), resp. rate 18, height 5\' 3"  (1.6 m), weight 46 kg, SpO2 100%. Body mass index is 17.98 kg/m.  Diagnosis: Principal Problem:   MDD (major depressive disorder) Active Problems:   PTSD (post-traumatic stress disorder)   PLAN: Safety and Monitoring:  -- Voluntary admission to inpatient psychiatric unit for safety, stabilization and treatment  -- Daily contact with patient to assess and evaluate symptoms and progress in treatment  -- Patient's case to be discussed in multi-disciplinary team meeting  -- Observation Level : q15 minute checks  -- Vital signs:  q12 hours  -- Precautions: suicide, elopement, and assault  2. Psychiatric Diagnoses and Treatment:  Lexapro 20 mg by mouth daily for depression and anxiety -Gabapentin 100 mg in the morning and 200 mg at bedtime for pain -Trazodone 50 mg at bedtime  -Added back her home medication BuSpar and dose to 10 mg twice daily to help with the anxiety  -- Encouraged patient to participate in unit milieu and in scheduled group therapies      3. Medical Issues Being Addressed: see h&P    4. Discharge Planning:   -- Social work and case management to assist with discharge planning and identification of  hospital follow-up needs prior to discharge  -- Estimated LOS: 3-4 days  Verner Chol, MD 10/18/2023, 1:39 PM

## 2023-10-18 NOTE — Group Note (Signed)
Recreation Therapy Group Note   Group Topic:Communication  Group Date: 10/18/2023 Start Time: 1400 End Time: 1440 Facilitators: Rosina Lowenstein, LRT, CTRS Location:  Dayroom  Group Description: Name That Food. Pt will randomly select 1 cut paper from laminated stack of popular food images from the 1970's-1990's from LRT. Without showing the group the image they selected, pt will use descriptive words to explain what food they selected. Once the image is correctly guessed, LRT and pts will reminisce and discuss past fond memories associated with the food as well as the importance of communication.  Goal Area(s) Addressed: Patient will increase communication skills.  Patient will increase frustration tolerance skills. Patient will reminisce a fond memory in their life.     Affect/Mood: Appropriate   Participation Level: Active and Engaged   Participation Quality: Independent   Behavior: Appropriate, Calm, and Cooperative   Speech/Thought Process: Coherent   Insight: Good   Judgement: Good   Modes of Intervention: Exploration, Guided Discussion, Socialization, and Support   Patient Response to Interventions:  Attentive, Engaged, Interested , and Receptive   Education Outcome:  Acknowledges education   Clinical Observations/Individualized Feedback: Evalin was active in their participation of session activities and group discussion. Pt spontaneously contributed to group discussion while interacting well with LRT and peers duration of session.    Plan: Continue to engage patient in RT group sessions 2-3x/week.   Rosina Lowenstein, LRT, CTRS 10/18/2023 4:38 PM

## 2023-10-18 NOTE — Plan of Care (Signed)
   Problem: Education: Goal: Knowledge of General Education information will improve Description: Including pain rating scale, medication(s)/side effects and non-pharmacologic comfort measures Outcome: Progressing   Problem: Activity: Goal: Risk for activity intolerance will decrease Outcome: Progressing   Problem: Coping: Goal: Level of anxiety will decrease Outcome: Progressing

## 2023-10-19 NOTE — Progress Notes (Signed)
 Patient pleasant and cooperative.  Animated affect.  Endorses anxiety and depression. Denies SI/HI and AVH.  Generalized pain rated 8/10.  Reports poor sleep.  Compliant with scheduled medications. PRN medication given for pain. 15 min checks in place for safety.  Patient is present in the milieu.  Participated in MHT group.    Pateint reports she feels like she is getting a cold.  Dr. Shela Commons made aware. Patient returned  to bed after lunch.

## 2023-10-19 NOTE — Progress Notes (Signed)
   10/19/23 1300  Spiritual Encounters  Type of Visit Initial;Follow up  Care provided to: Patient  Conversation partners present during encounter Social worker/Care management/TOC  Referral source Chaplain assessment  Reason for visit Routine spiritual support  OnCall Visit No  Spiritual Framework  Presenting Themes Meaning/purpose/sources of inspiration;Goals in life/care;Courage hope and growth;Impactful experiences and emotions  Community/Connection Family  Patient Stress Factors Family relationships  Interventions  Spiritual Care Interventions Made Compassionate presence;Reflective listening;Encouragement  Intervention Outcomes  Outcomes Awareness around self/spiritual resourses;Connection to values and goals of care   Social worker asked if the chaplain would talk to the patient because she was feeling depressed. Chaplain met with patient when she was admitted before. Patient shared that she has been having familial issues and that has been causing her stress. Chaplain offered her comforting words and told her that I would follow up with her.

## 2023-10-19 NOTE — Progress Notes (Signed)
 Patient requested medication for anxiety and upset stomach.  PRN medications given as ordered.

## 2023-10-19 NOTE — Progress Notes (Signed)
   10/19/23 0300  Psych Admission Type (Psych Patients Only)  Admission Status Voluntary  Psychosocial Assessment  Patient Complaints Depression  Eye Contact Fair  Facial Expression Sad  Affect Appropriate to circumstance  Speech Logical/coherent  Interaction Assertive  Motor Activity Slow  Appearance/Hygiene In scrubs  Behavior Characteristics Cooperative;Appropriate to situation  Mood Pleasant;Sad  Thought Process  Coherency WDL  Content WDL  Delusions None reported or observed  Perception WDL  Hallucination None reported or observed  Judgment WDL  Confusion None  Danger to Self  Current suicidal ideation? Denies  Agreement Not to Harm Self Yes  Description of Agreement Verbalized  Danger to Others  Danger to Others None reported or observed

## 2023-10-19 NOTE — Group Note (Signed)
Date:  10/19/2023 Time:  9:20 PM  Group Topic/Focus:  Wrap-Up Group:   The focus of this group is to help patients review their daily goal of treatment and discuss progress on daily workbooks.    Participation Level:  Minimal  Participation Quality:  Appropriate  Affect:  Appropriate  Cognitive:  Appropriate  Insight: Appropriate  Engagement in Group:  Engaged  Modes of Intervention:  Discussion  Additional Comments:    Maeola Harman 10/19/2023, 9:20 PM

## 2023-10-19 NOTE — Plan of Care (Signed)
   Problem: Health Behavior/Discharge Planning: Goal: Ability to manage health-related needs will improve Outcome: Progressing   Problem: Activity: Goal: Risk for activity intolerance will decrease Outcome: Progressing   Problem: Nutrition: Goal: Adequate nutrition will be maintained Outcome: Progressing   Problem: Coping: Goal: Level of anxiety will decrease Outcome: Progressing

## 2023-10-19 NOTE — Group Note (Signed)
Recreation Therapy Group Note   Group Topic:Leisure Education  Group Date: 10/19/2023 Start Time: 1400 End Time: 1445 Facilitators: Rosina Lowenstein, LRT, CTRS Location:  [facility]ty[facility]  Group Description: Music. Patients encouraged to name their favorite song(s) for LRT to play song through speaker for group to hear. Patient educated on the definition of leisure and the importance of having different leisure interests outside of the hospital. Group discussed how leisure activities can often be used as Pharmacologist and that listening to music is one example.   Goal Area(s) Addressed:  Patient will identify a current leisure interest.  Patient will practice making a positive decision. Patient will have the opportunity to try a new leisure activity.   Affect/Mood: N/A   Participation Level: Did not attend    Clinical Observations/Individualized Feedback: Patient did not attend group.   Plan: Continue to engage patient in RT group sessions 2-3x/week.   Rosina Lowenstein, LRT, CTRS 10/19/2023 5:01 PM

## 2023-10-19 NOTE — BHH Counselor (Signed)
CSW submitted APS report to [facility] APS.

## 2023-10-19 NOTE — Progress Notes (Signed)
[facility] MD Progress Note  10/19/2023 3:42 PM Julia Osborne  MRN:  [id number]  Patient is a  63 yo female who presented to emergency department for generalized weakness and tremors. Pt state that she fell yesterday and hit her head; she reported feeling suicidal; states she wants to cut her wrist as she is tired of the stress and the way she is living. Reports feels unsafe to return home  Subjective:  Chart reviewed, case discussed in multidisciplinary meeting, patient seen during rounds.  Patient reports that she feels anxious about her transition back home.  Yesterday during the multidisciplinary treatment team with social worker and nurse in the room patient has endorsed multiple concerns about living with her son in the house.  She reports that he has history of going to prison and has been a felon and he gets very irritated on little sounds.  Patient reports that she is scared to even cook during the daytime as he gets upset and slams door if she tries to work in [employer].  She reports that house is in a very bad situation where she cannot take care of her 5 dogs.  She tried her best to give them away but nobody has come to their rescue.  She reports that she had no food and is exhausting her money trying to pay bills because her son is living with her.  Provider asked if she feels unsafe with her son in the house and if we have to call APS.  Patient requested the team not to call APS as he has a legal history and patient did not want her son to get into trouble.  Discussed his case with the social work team and being the mandatory reporters social worker will be calling APS for a safety check on the house.  Patient denies SI/HI/plan.  Patient denies auditory/visual hallucinations.  She is taking her medications with no reported side effects.  Sleep: Fair  Appetite:  Fair  Past Psychiatric History: see h&P Family History:  Family History  Problem Relation Age of Onset   Other Mother        clot  that went to heart, deceased age 71ss   Heart attack Father        age 87s, deceased   Stroke Father    Cancer Father        "at death determined he was ate up with cancer"   Hypertension Sister    Kidney cancer Sister 44   Diabetes Brother    Hypertension Brother    Endometriosis Daughter    Heart attack Maternal Grandfather    Diabetes Sister    Brain cancer Paternal Aunt    Cancer Paternal Uncle        NOS   Cancer Paternal Uncle        NOS   Cancer Paternal Uncle        NOS   Colon cancer Neg Hx    Social History:  Social History   Substance and Sexual Activity  Alcohol Use No     Social History   Substance and Sexual Activity  Drug Use No    Social History   Socioeconomic History   Marital status: Widowed    Spouse name: Not on file   Number of children: 2   Years of education: Not on file   Highest education level: Not on file  Occupational History   Not on file  Tobacco Use   Smoking status: Never  Smokeless tobacco: Never  Vaping Use   Vaping status: Never Used  Substance and Sexual Activity   Alcohol use: No   Drug use: No   Sexual activity: Not Currently    Birth control/protection: Surgical    Comment: hyst  Other Topics Concern   Not on file  Social History Narrative   ** Merged History Encounter **       ** Data from: 04/28/23 Enc Dept: [facility]-PRE ADM TESTING   #  [place]; smoking- 1/2 ppd; no alcohol; used to run machines;   FHx-Dad- MI; ? Cancer on autopsy; sisters/aunts- brain; lung cancer x2; oldest sister- kidney cancer  Pt has 6 year old son;daughter 33 years. Brothers- states to have s   poken to her family re: importance of them being checked for lynch.        ** Data from: 05/04/23 Enc Dept: [facility]   Lives alone with 4 large dogs.    Social Drivers of Health   Financial Resource Strain: Medium Risk (12/07/2022)   Overall Financial Resource Strain (CARDIA)    Difficulty of Paying Living Expenses:  Somewhat hard  Food Insecurity: Food Insecurity Present (10/16/2023)   Hunger Vital Sign    Worried About Running Out of Food in the Last Year: Sometimes true    Ran Out of Food in the Last Year: Sometimes true  Transportation Needs: Unmet Transportation Needs (10/16/2023)   PRAPARE - Administrator, Civil Service (Medical): Yes    Lack of Transportation (Non-Medical): Yes  Physical Activity: Inactive (12/07/2022)   Exercise Vital Sign    Days of Exercise per Week: 0 days    Minutes of Exercise per Session: 0 min  Stress: Stress Concern Present (12/07/2022)   [employer] of Occupational Health - Occupational Stress Questionnaire    Feeling of Stress : To some extent  Social Connections: Socially Isolated (10/16/2023)   Social Connection and Isolation Panel [NHANES]    Frequency of Communication with Friends and Family: More than three times a week    Frequency of Social Gatherings with Friends and Family: More than three times a week    Attends Religious Services: Never    Database administrator or Organizations: No    Attends Banker Meetings: Never    Marital Status: Widowed   Past Medical History:  Past Medical History:  Diagnosis Date   Abdominal pain, acute, left upper quadrant    Abdominal pain, epigastric    Abnormal CT scan, sigmoid colon    Abnormal vaginal Pap smear    Adenocarcinoma of colon (HCC) 09/13/2015   Partial colon resection and chemo tx's.    Anemia    Anxiety    Anxiety disorder, unspecified    Aphasia    Asthma    Cancer (HCC)    endometrial; cancer cells in intestine   Chronic pain of left knee    Colon neoplasm    Constipation    Conversion reaction    COPD (chronic obstructive pulmonary disease) (HCC)    no definite diagnosis   Depression    Diverticulitis    Diverticulosis of colon without hemorrhage    Dyspareunia 05/16/2015   Dysphasia    Endometrial cancer (HCC)    Esophageal dysphagia    Family history of  cancer    Family history of kidney cancer    Ganglion of left knee    Gastric intestinal metaplasia    GERD (gastroesophageal reflux disease)    Glaucoma  Hematochezia    History of colonic polyps    Hyperlipidemia    Indigestion    Lynch syndrome    Mucosal abnormality of stomach    Nausea and vomiting    Neuropathy    feet and hands   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 drug (HCC)    PONV (postoperative nausea and vomiting)    Pre-diabetes    Primary osteoarthritis of left knee    Rectal bleeding    Reflux esophagitis    Soft tissue swelling of knee joint    Uterine fibroid    UTI (urinary tract infection) 04/26/2023   on cipro   Vaginal dryness 05/16/2015   Vaginal itching    Vaginal Pap smear, abnormal     Past Surgical History:  Procedure Laterality Date   ABDOMINAL HYSTERECTOMY     APPENDECTOMY     BIOPSY N/A 03/14/2015   Procedure: BIOPSY;  Surgeon: Corbin Ade, MD;  Location: [facility];  Service: Endoscopy;  Laterality: N/A;  Gastric   COLONOSCOPY N/A 01/28/2017   Procedure: COLONOSCOPY;  Surgeon: Corbin Ade, MD;  Location[facility];  Service: Endoscopy;  Laterality: N/A;  11:30am   COLONOSCOPY WITH PROPOFOL N/A 03/14/2015   RMR: Internal hemorrhoids. colonic diverticulosis. Incomplete examination. Prepartation inadequate.   COLONOSCOPY WITH PROPOFOL N/A 07/04/2015   RMR: Colonic diverticulosis . Large polypoid lesion in the vicinity of the hepatic flexure status post saline-assisted piecmeal snare polypectomy  with ablation and tattooing as described. Sigmoid polyp removed as described above. sigmoid colon polyp hyperplastic, hepatic flexure polyp with TA with focal high grade dysplasia    COLONOSCOPY WITH PROPOFOL N/A 11/03/2018   Procedure: COLONOSCOPY WITH PROPOFOL;  Surgeon: Wyline Mood, MD;  Location: [facility] ENDOSCOPY;  Service: Gastroenterology;  Laterality: N/A;   COLONOSCOPY WITH PROPOFOL N/A 12/09/2020   Procedure: COLONOSCOPY WITH PROPOFOL;   Surgeon: Wyline Mood, MD;  Location: [facility] ENDOSCOPY;  Service: Gastroenterology;  Laterality: N/A;  COVID POSITIVE 10/30/2020   COLONOSCOPY WITH PROPOFOL N/A 01/20/2021   Procedure: COLONOSCOPY WITH PROPOFOL;  Surgeon: Wyline Mood, MD;  Location: [facility] ENDOSCOPY;  Service: Gastroenterology;  Laterality: N/A;   COLONOSCOPY WITH PROPOFOL N/A 12/10/2021   Procedure: COLONOSCOPY WITH PROPOFOL;  Surgeon: Wyline Mood, MD;  Location: [facility] ENDOSCOPY;  Service: Gastroenterology;  Laterality: N/A;   COLONOSCOPY WITH PROPOFOL N/A 02/16/2023   Procedure: COLONOSCOPY WITH PROPOFOL;  Surgeon: Wyline Mood, MD;  Location: [facility] ENDOSCOPY;  Service: Gastroenterology;  Laterality: N/A;   ENTEROSCOPY N/A 02/12/2020   Procedure: ENTEROSCOPY;  Surgeon: Wyline Mood, MD;  Location: [facility] ENDOSCOPY;  Service: Gastroenterology;  Laterality: N/A;   ESOPHAGEAL DILATION N/A 03/14/2015   Procedure: ESOPHAGEAL DILATION;  Surgeon: Corbin Ade, MD;  Location: [facility];  Service: Endoscopy;  Laterality: N/AElease Hashimoto 54   ESOPHAGOGASTRODUODENOSCOPY N/A 05/19/2016   Procedure: ESOPHAGOGASTRODUODENOSCOPY (EGD);  Surgeon: Corbin Ade, MD;  [facility];  Service: Endoscopy;  Laterality: N/A;  215   ESOPHAGOGASTRODUODENOSCOPY N/A 01/28/2017   Procedure: ESOPHAGOGASTRODUODENOSCOPY (EGD);  Surgeon: Corbin Ade, MD;  [facility];  Service: Endoscopy;  Laterality: N/A;   ESOPHAGOGASTRODUODENOSCOPY N/A 12/10/2021   Procedure: ESOPHAGOGASTRODUODENOSCOPY (EGD);  Surgeon: Wyline Mood, MD;  Location: [facility] ENDOSCOPY;  Service: Gastroenterology;  Laterality: N/A;   ESOPHAGOGASTRODUODENOSCOPY (EGD) WITH PROPOFOL N/A 03/14/2015   RMR: Mild erosive reflux esophagitis status post passage o f a Maloney dilator. Abnormal gastric mucosa of uncertain significance as described above. status post biopsy, benign   ESOPHAGOGASTRODUODENOSCOPY (EGD) WITH PROPOFOL N/A 11/18/2017  Procedure: ESOPHAGOGASTRODUODENOSCOPY (EGD) WITH  PROPOFOL;  Surgeon: Corbin Ade, MD;  [facility];  Service: Endoscopy;  Laterality: N/A;  12:15pm   ESOPHAGOGASTRODUODENOSCOPY (EGD) WITH PROPOFOL N/A 11/03/2018   Procedure: ESOPHAGOGASTRODUODENOSCOPY (EGD) WITH PROPOFOL;  Surgeon: Wyline Mood, MD;  Location: [facility] ENDOSCOPY;  Service: Gastroenterology;  Laterality: N/A;   ESOPHAGOGASTRODUODENOSCOPY (EGD) WITH PROPOFOL N/A 02/16/2023   Procedure: ESOPHAGOGASTRODUODENOSCOPY (EGD) WITH PROPOFOL;  Surgeon: Wyline Mood, MD;  Location: [facility] ENDOSCOPY;  Service: Gastroenterology;  Laterality: N/A;   GIVENS CAPSULE STUDY N/A 12/25/2019   Procedure: GIVENS CAPSULE STUDY;  Surgeon: Wyline Mood, MD;  Location: [facility] ENDOSCOPY;  Service: Gastroenterology;  Laterality: N/A;   MALONEY DILATION N/A 05/19/2016   Procedure: [name] DILATION;  Surgeon: Corbin Ade, MD;  [facility];  Service: Endoscopy;  Laterality: N/A;   MALONEY DILATION N/A 01/28/2017   Procedure: [name] DILATION;  Surgeon: Corbin Ade, MD;  [facility];  Service: Endoscopy;  Laterality: N/A;   MALONEY DILATION N/A 11/18/2017   Procedure: [name] DILATION;  Surgeon: Corbin Ade, MD;  [facility];  Service: Endoscopy;  Laterality: N/A;   PARTIAL COLECTOMY  08/30/2015   polyp with adenocarcinoma   PARTIAL COLECTOMY  08/29/2018   POLYPECTOMY N/A 07/04/2015   Procedure: POLYPECTOMY;  Surgeon: Corbin Ade, MD;  Location: [facility];  Service: Endoscopy;  Laterality: N/A;   PORTACATH PLACEMENT Right 09/21/2014   TUBAL LIGATION      Current Medications: Current Facility-Administered Medications  Medication Dose Route Frequency Provider Last Rate Last Admin   acetaminophen (TYLENOL) tablet 650 mg  650 mg Oral Q6H PRN Jearld Lesch, NP   650 mg at 10/19/23 0827   albuterol (VENTOLIN HFA) 108 (90 Base) MCG/ACT inhaler 2 puff  2 puff Inhalation Q2H PRN Jearld Lesch, NP       alum & mag hydroxide-simeth (MAALOX/MYLANTA)  200-200-20 MG/5ML suspension 30 mL  30 mL Oral Q4H PRN Jearld Lesch, NP       aspirin EC tablet 81 mg  81 mg Oral Daily Dixon, Rashaun M, NP   81 mg at 10/19/23 1051   atorvastatin (LIPITOR) tablet 40 mg  40 mg Oral Daily Dixon, Rashaun M, NP   40 mg at 10/19/23 1051   brinzolamide (AZOPT) 1 % ophthalmic suspension 1 drop  1 drop Left Eye QHS Dixon, Rashaun M, NP   1 drop at 10/18/23 2246   And   brimonidine (ALPHAGAN) 0.2 % ophthalmic solution 1 drop  1 drop Left Eye QHS Dixon, Rashaun M, NP   1 drop at 10/18/23 2246   busPIRone (BUSPAR) tablet 10 mg  10 mg Oral BID Verner Chol, MD   10 mg at 10/19/23 1051   clopidogrel (PLAVIX) tablet 75 mg  75 mg Oral Daily Dixon, Rashaun M, NP   75 mg at 10/19/23 1051   diphenhydrAMINE (BENADRYL) capsule 25 mg  25 mg Oral Q6H PRN Lewanda Rife, MD   25 mg at 10/17/23 2159   docusate sodium (COLACE) capsule 100 mg  100 mg Oral BID Lewanda Rife, MD   100 mg at 10/18/23 2240   escitalopram (LEXAPRO) tablet 20 mg  20 mg Oral QHS Dixon, Rashaun M, NP   20 mg at 10/18/23 2240   feeding supplement (ENSURE ENLIVE / ENSURE PLUS) liquid 237 mL  237 mL Oral BID BM Lewanda Rife, MD   237 mL at 10/18/23 1020   ferrous sulfate tablet 325 mg  325  mg Oral Rachel Moulds, NP   325 mg at 10/19/23 1052   fluticasone (FLONASE) 50 MCG/ACT nasal spray 1 spray  1 spray Each Nare Daily Jearld Lesch, NP   1 spray at 10/19/23 1050   gabapentin (NEURONTIN) capsule 100 mg  100 mg Oral Daily Lewanda Rife, MD   100 mg at 10/19/23 1052   gabapentin (NEURONTIN) capsule 200 mg  200 mg Oral QHS Dixon, Rashaun M, NP   200 mg at 10/18/23 2241   hydrOXYzine (ATARAX) tablet 25 mg  25 mg Oral Q6H PRN Jearld Lesch, NP   25 mg at 10/17/23 1647   latanoprost (XALATAN) 0.005 % ophthalmic solution 1 drop  1 drop Left Eye QHS Jearld Lesch, NP   1 drop at 10/18/23 2247   magnesium hydroxide (MILK OF MAGNESIA) suspension 30 mL  30 mL Oral Daily PRN Jearld Lesch, NP       nicotine (NICODERM CQ - dosed in mg/24 hours) patch 21 mg  21 mg Transdermal Daily Dixon, Rashaun M, NP   21 mg at 10/19/23 1050   OLANZapine (ZYPREXA) injection 5 mg  5 mg Intramuscular TID PRN Jearld Lesch, NP       OLANZapine zydis (ZYPREXA) disintegrating tablet 5 mg  5 mg Oral TID PRN Jearld Lesch, NP       pantoprazole (PROTONIX) EC tablet 40 mg  40 mg Oral QAC breakfast Dixon, Rashaun M, NP   40 mg at 10/19/23 1051   traZODone (DESYREL) tablet 50 mg  50 mg Oral QHS Lewanda Rife, MD   50 mg at 10/18/23 2240    Lab Results: No results found for this or any previous visit (from the past 48 hours).  Blood Alcohol level:  Lab Results  Component Value Date   ETH <10 10/15/2023   ETH <10 09/29/2023    Metabolic Disorder Labs: Lab Results  Component Value Date   HGBA1C 5.9 (H) 05/03/2023   MPG 123 05/03/2023   No results found for: "PROLACTIN" Lab Results  Component Value Date   CHOL 149 05/03/2023   TRIG 66 05/03/2023   HDL 49 05/03/2023   CHOLHDL 3.0 05/03/2023   VLDL 13 05/03/2023   LDLCALC 87 05/03/2023    Physical Findings: AIMS:  , ,  ,  ,    CIWA:    COWS:      Psychiatric Specialty Exam:  Presentation  General Appearance:  Appropriate for Environment  Eye Contact: Fair  Speech: Clear and Coherent  Speech Volume: Normal    Mood and Affect  Mood: Anxious  Affect: Depressed   Thought Process  Thought Processes: Linear  Descriptions of Associations:Intact  Orientation:Full (Time, Place and Person)  Thought Content:Logical  Hallucinations:Hallucinations: None  Ideas of Reference:None  Suicidal Thoughts:Suicidal Thoughts: No  Homicidal Thoughts:Homicidal Thoughts: No   Sensorium  Memory: Immediate Fair; Recent Fair; Remote Fair  Judgment: Impaired  Insight: Shallow   Executive Functions  Concentration: Fair  Attention Span: Fair  Recall: [employer] of  Knowledge: Fair  Language: Fair   Psychomotor Activity  Psychomotor Activity: Psychomotor Activity: Normal  Musculoskeletal: Strength & Muscle Tone: within normal limits Gait & Station: normal Assets  Assets: Manufacturing systems engineer; Desire for Improvement; Resilience    Physical Exam: Physical Exam Vitals and nursing note reviewed.  HENT:     Head: Normocephalic.     Nose: Nose normal.     Mouth/Throat:     Mouth: Mucous membranes  are moist.  Eyes:     Pupils: Pupils are equal, round, and reactive to light.  Cardiovascular:     Rate and Rhythm: Normal rate.     Pulses: Normal pulses.  Pulmonary:     Effort: Pulmonary effort is normal.  Abdominal:     General: Bowel sounds are normal.  Skin:    General: Skin is warm.  Neurological:     General: No focal deficit present.     Mental Status: She is alert.    Review of Systems  Constitutional: Negative.   HENT: Negative.    Eyes: Negative.   Respiratory: Negative.    Cardiovascular: Negative.   Gastrointestinal: Negative.   Skin: Negative.   Neurological: Negative.    Blood pressure (!) 86/48, pulse 71, temperature 99 F (37.2 C), temperature source Oral, resp. rate 18, height 5\' 3"  (1.6 m), weight 46 kg, SpO2 100%. Body mass index is 17.98 kg/m.  Diagnosis: Principal Problem:   MDD (major depressive disorder) Active Problems:   PTSD (post-traumatic stress disorder)   PLAN: Safety and Monitoring:  -- Voluntary admission to inpatient psychiatric unit for safety, stabilization and treatment  -- Daily contact with patient to assess and evaluate symptoms and progress in treatment  -- Patient's case to be discussed in multi-disciplinary team meeting  -- Observation Level : q15 minute checks  -- Vital signs:  q12 hours  -- Precautions: suicide, elopement, and assault  2. Psychiatric Diagnoses and Treatment:  Lexapro 20 mg by mouth daily for depression and anxiety -Gabapentin 100 mg in the morning and 200  mg at bedtime for pain -Trazodone 50 mg at bedtime  -Added back her home medication BuSpar and dose to 10 mg twice daily to help with the anxiety  -- Encouraged patient to participate in unit milieu and in scheduled group therapies      3. Medical Issues Being Addressed: see h&P    4. Discharge Planning:   -- Social work and case management to assist with discharge planning and identification of hospital follow-up needs prior to discharge  -- Estimated LOS: 3-4 days  Verner Chol, MD 10/19/2023, 3:42 PM

## 2023-10-19 NOTE — Progress Notes (Signed)
   10/19/23 2300  Psych Admission Type (Psych Patients Only)  Admission Status Voluntary  Psychosocial Assessment  Patient Complaints Anxiety  Eye Contact Fair  Facial Expression Animated  Affect Appropriate to circumstance  Speech Logical/coherent  Interaction Assertive  Motor Activity Slow  Appearance/Hygiene Unremarkable;In scrubs  Behavior Characteristics Cooperative  Mood Pleasant  Thought Process  Coherency WDL  Content WDL  Delusions None reported or observed  Perception WDL  Hallucination None reported or observed  Judgment WDL  Confusion None  Danger to Self  Current suicidal ideation? Denies  Agreement Not to Harm Self Yes  Description of Agreement verbal  Danger to Others  Danger to Others None reported or observed

## 2023-10-20 MED ORDER — CARMEX CLASSIC LIP BALM EX OINT: TOPICAL_OINTMENT | CUTANEOUS | Status: DC | PRN

## 2023-10-20 MED ORDER — BLISTEX MEDICATED EX OINT: TOPICAL_OINTMENT | CUTANEOUS | Status: DC | PRN

## 2023-10-20 NOTE — Plan of Care (Signed)
  Problem: Education: Goal: Knowledge of General Education information will improve Description: Including pain rating scale, medication(s)/side effects and non-pharmacologic comfort measures Outcome: Progressing   Problem: Health Behavior/Discharge Planning: Goal: Ability to manage health-related needs will improve Outcome: Progressing   Problem: Clinical Measurements: Goal: Ability to maintain clinical measurements within normal limits will improve Outcome: Progressing Goal: Cardiovascular complication will be avoided Outcome: Progressing   Problem: Activity: Goal: Risk for activity intolerance will decrease Outcome: Progressing   Problem: Nutrition: Goal: Adequate nutrition will be maintained Outcome: Progressing   Problem: Coping: Goal: Level of anxiety will decrease Outcome: Progressing   Problem: Elimination: Goal: Will not experience complications related to bowel motility Outcome: Progressing Goal: Will not experience complications related to urinary retention Outcome: Progressing   Problem: Safety: Goal: Ability to remain free from injury will improve Outcome: Progressing   Problem: Skin Integrity: Goal: Risk for impaired skin integrity will decrease Outcome: Progressing   Problem: Education: Goal: Ability to state activities that reduce stress will improve Outcome: Progressing   Problem: Coping: Goal: Ability to identify and develop effective coping behavior will improve Outcome: Progressing   Problem: Self-Concept: Goal: Ability to identify factors that promote anxiety will improve Outcome: Progressing Goal: Level of anxiety will decrease Outcome: Progressing Goal: Ability to modify response to factors that promote anxiety will improve Outcome: Progressing

## 2023-10-20 NOTE — Progress Notes (Signed)
   10/20/23 0543  15 Minute Checks  Location Bedroom  Visual Appearance Calm  Behavior Sleeping  Sleep (Behavioral Health Patients Only)  Calculate sleep? (Click Yes once per 24 hr at 0600 safety check) Yes  Documented sleep last 24 hours 10

## 2023-10-20 NOTE — Plan of Care (Signed)
  Problem: Education: Goal: Knowledge of General Education information will improve Description: Including pain rating scale, medication(s)/side effects and non-pharmacologic comfort measures 10/20/2023 1310 by Doneen Poisson, RN Outcome: Progressing 10/20/2023 1308 by Doneen Poisson, RN Outcome: Progressing   Problem: Health Behavior/Discharge Planning: Goal: Ability to manage health-related needs will improve 10/20/2023 1310 by Doneen Poisson, RN Outcome: Progressing 10/20/2023 1308 by Doneen Poisson, RN Outcome: Progressing   Problem: Clinical Measurements: Goal: Ability to maintain clinical measurements within normal limits will improve 10/20/2023 1310 by Doneen Poisson, RN Outcome: Progressing 10/20/2023 1308 by Doneen Poisson, RN Outcome: Progressing Goal: Cardiovascular complication will be avoided 10/20/2023 1310 by Doneen Poisson, RN Outcome: Progressing 10/20/2023 1308 by Doneen Poisson, RN Outcome: Progressing   Problem: Activity: Goal: Risk for activity intolerance will decrease 10/20/2023 1310 by Doneen Poisson, RN Outcome: Progressing 10/20/2023 1308 by Doneen Poisson, RN Outcome: Progressing   Problem: Nutrition: Goal: Adequate nutrition will be maintained 10/20/2023 1310 by Doneen Poisson, RN Outcome: Progressing 10/20/2023 1308 by Doneen Poisson, RN Outcome: Progressing   Problem: Coping: Goal: Level of anxiety will decrease 10/20/2023 1310 by Doneen Poisson, RN Outcome: Progressing 10/20/2023 1308 by Doneen Poisson, RN Outcome: Progressing   Problem: Elimination: Goal: Will not experience complications related to bowel motility 10/20/2023 1310 by Doneen Poisson, RN Outcome: Progressing 10/20/2023 1308 by Doneen Poisson, RN Outcome: Progressing Goal: Will not experience complications related to urinary retention 10/20/2023 1310 by Doneen Poisson, RN Outcome: Progressing 10/20/2023 1308 by  Doneen Poisson, RN Outcome: Progressing   Problem: Safety: Goal: Ability to remain free from injury will improve 10/20/2023 1310 by Doneen Poisson, RN Outcome: Progressing 10/20/2023 1308 by Doneen Poisson, RN Outcome: Progressing   Problem: Skin Integrity: Goal: Risk for impaired skin integrity will decrease 10/20/2023 1310 by Doneen Poisson, RN Outcome: Progressing 10/20/2023 1308 by Doneen Poisson, RN Outcome: Progressing   Problem: Education: Goal: Ability to state activities that reduce stress will improve 10/20/2023 1310 by Doneen Poisson, RN Outcome: Progressing 10/20/2023 1308 by Doneen Poisson, RN Outcome: Progressing   Problem: Coping: Goal: Ability to identify and develop effective coping behavior will improve 10/20/2023 1310 by Doneen Poisson, RN Outcome: Progressing 10/20/2023 1308 by Doneen Poisson, RN Outcome: Progressing   Problem: Self-Concept: Goal: Ability to identify factors that promote anxiety will improve 10/20/2023 1310 by Doneen Poisson, RN Outcome: Progressing 10/20/2023 1308 by Doneen Poisson, RN Outcome: Progressing Goal: Level of anxiety will decrease 10/20/2023 1310 by Doneen Poisson, RN Outcome: Progressing 10/20/2023 1308 by Doneen Poisson, RN Outcome: Progressing Goal: Ability to modify response to factors that promote anxiety will improve 10/20/2023 1310 by Doneen Poisson, RN Outcome: Progressing 10/20/2023 1308 by Doneen Poisson, RN Outcome: Progressing

## 2023-10-20 NOTE — Progress Notes (Signed)
   10/20/23 0729  Psych Admission Type (Psych Patients Only)  Admission Status Voluntary  Psychosocial Assessment  Patient Complaints Anxiety  Eye Contact Fair  Facial Expression Anxious  Affect Appropriate to circumstance  Speech Logical/coherent  Interaction Needy  Motor Activity Slow  Appearance/Hygiene Unremarkable  Behavior Characteristics Cooperative  Mood Pleasant  Thought Process  Coherency WDL  Content WDL  Delusions None reported or observed  Perception WDL  Hallucination None reported or observed  Judgment WDL  Confusion None  Danger to Self  Current suicidal ideation? Denies

## 2023-10-20 NOTE — Progress Notes (Signed)
   10/20/23 2300  Psych Admission Type (Psych Patients Only)  Admission Status Voluntary  Psychosocial Assessment  Patient Complaints Anxiety  Eye Contact Fair  Facial Expression Animated;Anxious  Affect Anxious  Speech Logical/coherent  Interaction Assertive  Motor Activity Slow  Appearance/Hygiene Unremarkable  Behavior Characteristics Cooperative;Anxious  Mood Anxious;Pleasant  Thought Process  Coherency WDL  Content WDL  Delusions None reported or observed  Perception WDL  Hallucination None reported or observed  Judgment WDL  Confusion None  Danger to Self  Current suicidal ideation? Denies  Self-Injurious Behavior No self-injurious ideation or behavior indicators observed or expressed   Agreement Not to Harm Self Yes  Description of Agreement verbal  Danger to Others  Danger to Others None reported or observed

## 2023-10-20 NOTE — Progress Notes (Signed)
[facility] MD Progress Note  10/20/2023 11:41 AM Julia Osborne  MRN:  [id number]  Patient is a  63 yo female who presented to emergency department for generalized weakness and tremors. Pt state that she fell yesterday and hit her head; she reported feeling suicidal; states she wants to cut her wrist as she is tired of the stress and the way she is living. Reports feels unsafe to return home  Subjective:  Chart reviewed, case discussed in multidisciplinary meeting, patient seen during rounds.  Patient is noted to be pacing in the hallway.  She reports that she is very anxious about going home.  She reports that her son is sick and is not able to take care of the dogs.  She reports that her situation at home will not change and she is worried that her depression will get worse when she is back to the same situation.  She continues to feel hopeless about her life situation.  She did not endorse any active SI/HI/plan.  She did not endorse any auditory/visual hallucinations.  She is taking her medications with no reported side effects Sleep: Fair  Appetite:  Fair  Past Psychiatric History: see h&P Family History:  Family History  Problem Relation Age of Onset   Other Mother        clot that went to heart, deceased age 18ss   Heart attack Father        age 52s, deceased   Stroke Father    Cancer Father        "at death determined he was ate up with cancer"   Hypertension Sister    Kidney cancer Sister 9   Diabetes Brother    Hypertension Brother    Endometriosis Daughter    Heart attack Maternal Grandfather    Diabetes Sister    Brain cancer Paternal Aunt    Cancer Paternal Uncle        NOS   Cancer Paternal Uncle        NOS   Cancer Paternal Uncle        NOS   Colon cancer Neg Hx    Social History:  Social History   Substance and Sexual Activity  Alcohol Use No     Social History   Substance and Sexual Activity  Drug Use No    Social History   Socioeconomic History   Marital  status: Widowed    Spouse name: Not on file   Number of children: 2   Years of education: Not on file   Highest education level: Not on file  Occupational History   Not on file  Tobacco Use   Smoking status: Never   Smokeless tobacco: Never  Vaping Use   Vaping status: Never Used  Substance and Sexual Activity   Alcohol use: No   Drug use: No   Sexual activity: Not Currently    Birth control/protection: Surgical    Comment: hyst  Other Topics Concern   Not on file  Social History Narrative   ** Merged History Encounter **       ** Data from: 04/28/23 Enc Dept: [facility]-PRE ADM TESTING   #  [place]ives [place]; smoking- 1/2 ppd; no alcohol; used to run machines;   FHx-Dad- MI; ? Cancer on autopsy; sisters/aunts- brain; lung cancer x2; oldest sister- kidney cancer  Pt has 86 year old son;daughter 33 years. Brothers- states to have s   poken to her family re: importance of them being checked for lynch.        **  Data from: 05/04/23 Enc Dept: [facility]   Lives alone with 4 large dogs.    Social Drivers of Health   Financial Resource Strain: Medium Risk (12/07/2022)   Overall Financial Resource Strain (CARDIA)    Difficulty of Paying Living Expenses: Somewhat hard  Food Insecurity: Food Insecurity Present (10/16/2023)   Hunger Vital Sign    Worried About Running Out of Food in the Last Year: Sometimes true    Ran Out of Food in the Last Year: Sometimes true  Transportation Needs: Unmet Transportation Needs (10/16/2023)   PRAPARE - Administrator, Civil Service (Medical): Yes    Lack of Transportation (Non-Medical): Yes  Physical Activity: Inactive (12/07/2022)   Exercise Vital Sign    Days of Exercise per Week: 0 days    Minutes of Exercise per Session: 0 min  Stress: Stress Concern Present (12/07/2022)   [employer] of Occupational Health - Occupational Stress Questionnaire    Feeling of Stress : To some extent  Social Connections: Socially Isolated  (10/16/2023)   Social Connection and Isolation Panel [NHANES]    Frequency of Communication with Friends and Family: More than three times a week    Frequency of Social Gatherings with Friends and Family: More than three times a week    Attends Religious Services: Never    Database administrator or Organizations: No    Attends Banker Meetings: Never    Marital Status: Widowed   Past Medical History:  Past Medical History:  Diagnosis Date   Abdominal pain, acute, left upper quadrant    Abdominal pain, epigastric    Abnormal CT scan, sigmoid colon    Abnormal vaginal Pap smear    Adenocarcinoma of colon (HCC) 09/13/2015   Partial colon resection and chemo tx's.    Anemia    Anxiety    Anxiety disorder, unspecified    Aphasia    Asthma    Cancer (HCC)    endometrial; cancer cells in intestine   Chronic pain of left knee    Colon neoplasm    Constipation    Conversion reaction    COPD (chronic obstructive pulmonary disease) (HCC)    no definite diagnosis   Depression    Diverticulitis    Diverticulosis of colon without hemorrhage    Dyspareunia 05/16/2015   Dysphasia    Endometrial cancer (HCC)    Esophageal dysphagia    Family history of cancer    Family history of kidney cancer    Ganglion of left knee    Gastric intestinal metaplasia    GERD (gastroesophageal reflux disease)    Glaucoma    Hematochezia    History of colonic polyps    Hyperlipidemia    Indigestion    Lynch syndrome    Mucosal abnormality of stomach    Nausea and vomiting    Neuropathy    feet and hands   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 drug (HCC)    PONV (postoperative nausea and vomiting)    Pre-diabetes    Primary osteoarthritis of left knee    Rectal bleeding    Reflux esophagitis    Soft tissue swelling of knee joint    Uterine fibroid    UTI (urinary tract infection) 04/26/2023   on cipro   Vaginal dryness 05/16/2015   Vaginal itching    Vaginal Pap smear, abnormal      Past Surgical History:  Procedure Laterality Date   ABDOMINAL HYSTERECTOMY  APPENDECTOMY     BIOPSY N/A 03/14/2015   Procedure: BIOPSY;  Surgeon: Corbin Ade, MD;  Location: [facility];  Service: Endoscopy;  Laterality: N/A;  Gastric   COLONOSCOPY N/A 01/28/2017   Procedure: COLONOSCOPY;  Surgeon: Corbin Ade, MD;  Location: [facility];  Service: Endoscopy;  Laterality: N/A;  11:30am   COLONOSCOPY WITH PROPOFOL N/A 03/14/2015   RMR: Internal hemorrhoids. colonic diverticulosis. Incomplete examination. Prepartation inadequate.   COLONOSCOPY WITH PROPOFOL N/A 07/04/2015   RMR: Colonic diverticulosis . Large polypoid lesion in the vicinity of the hepatic flexure status post saline-assisted piecmeal snare polypectomy  with ablation and tattooing as described. Sigmoid polyp removed as described above. sigmoid colon polyp hyperplastic, hepatic flexure polyp with TA with focal high grade dysplasia    COLONOSCOPY WITH PROPOFOL N/A 11/03/2018   Procedure: COLONOSCOPY WITH PROPOFOL;  Surgeon: Wyline Mood, MD;  Location: [facility] ENDOSCOPY;  Service: Gastroenterology;  Laterality: N/A;   COLONOSCOPY WITH PROPOFOL N/A 12/09/2020   Procedure: COLONOSCOPY WITH PROPOFOL;  Surgeon: Wyline Mood, MD;  Location: [facility] ENDOSCOPY;  Service: Gastroenterology;  Laterality: N/A;  COVID POSITIVE 10/30/2020   COLONOSCOPY WITH PROPOFOL N/A 01/20/2021   Procedure: COLONOSCOPY WITH PROPOFOL;  Surgeon: Wyline Mood, MD;  Location: [facility] ENDOSCOPY;  Service: Gastroenterology;  Laterality: N/A;   COLONOSCOPY WITH PROPOFOL N/A 12/10/2021   Procedure: COLONOSCOPY WITH PROPOFOL;  Surgeon: Wyline Mood, MD;  Location: [facility] ENDOSCOPY;  Service: Gastroenterology;  Laterality: N/A;   COLONOSCOPY WITH PROPOFOL N/A 02/16/2023   Procedure: COLONOSCOPY WITH PROPOFOL;  Surgeon: Wyline Mood, MD;  Location: [facility] ENDOSCOPY;  Service: Gastroenterology;  Laterality: N/A;   ENTEROSCOPY N/A 02/12/2020   Procedure: ENTEROSCOPY;  Surgeon:  Wyline Mood, MD;  Location: [facility] ENDOSCOPY;  Service: Gastroenterology;  Laterality: N/A;   ESOPHAGEAL DILATION N/A 03/14/2015   Procedure: ESOPHAGEAL DILATION;  Surgeon: Corbin Ade, MD;  Location: [facility];  Service: Endoscopy;  Laterality: N/AElease Hashimoto 54   ESOPHAGOGASTRODUODENOSCOPY N/A 05/19/2016   Procedure: ESOPHAGOGASTRODUODENOSCOPY (EGD);  Surgeon: Corbin Ade, MD;  [facility];  Service: Endoscopy;  Laterality: N/A;  215   ESOPHAGOGASTRODUODENOSCOPY N/A 01/28/2017   Procedure: ESOPHAGOGASTRODUODENOSCOPY (EGD);  Surgeon: Corbin Ade, MD;  [facility];  Service: Endoscopy;  Laterality: N/A;   ESOPHAGOGASTRODUODENOSCOPY N/A 12/10/2021   Procedure: ESOPHAGOGASTRODUODENOSCOPY (EGD);  Surgeon: Wyline Mood, MD;  Location: [facility] ENDOSCOPY;  Service: Gastroenterology;  Laterality: N/A;   ESOPHAGOGASTRODUODENOSCOPY (EGD) WITH PROPOFOL N/A 03/14/2015   RMR: Mild erosive reflux esophagitis status post passage o f a Maloney dilator. Abnormal gastric mucosa of uncertain significance as described above. status post biopsy, benign   ESOPHAGOGASTRODUODENOSCOPY (EGD) WITH PROPOFOL N/A 11/18/2017   Procedure: ESOPHAGOGASTRODUODENOSCOPY (EGD) WITH PROPOFOL;  Surgeon: Corbin Ade, MD;  [facility];  Service: Endoscopy;  Laterality: N/A;  12:15pm   ESOPHAGOGASTRODUODENOSCOPY (EGD) WITH PROPOFOL N/A 11/03/2018   Procedure: ESOPHAGOGASTRODUODENOSCOPY (EGD) WITH PROPOFOL;  Surgeon: Wyline Mood, MD;  Location: [facility] ENDOSCOPY;  Service: Gastroenterology;  Laterality: N/A;   ESOPHAGOGASTRODUODENOSCOPY (EGD) WITH PROPOFOL N/A 02/16/2023   Procedure: ESOPHAGOGASTRODUODENOSCOPY (EGD) WITH PROPOFOL;  Surgeon: Wyline Mood, MD;  Location: [facility] ENDOSCOPY;  Service: Gastroenterology;  Laterality: N/A;   GIVENS CAPSULE STUDY N/A 12/25/2019   Procedure: GIVENS CAPSULE STUDY;  Surgeon: Wyline Mood, MD;  Location: [facility] ENDOSCOPY;  Service: Gastroenterology;  Laterality: N/A;    MALONEY DILATION N/A 05/19/2016   Procedure: [name] DILATION;  Surgeon: Corbin Ade, MD;  [facility];  Service: Endoscopy;  Laterality: N/A;  MALONEY DILATION N/A 01/28/2017   Procedure: [name] DILATION;  Surgeon: Corbin Ade, MD;  [facility];  Service: Endoscopy;  Laterality: N/A;   MALONEY DILATION N/A 11/18/2017   Procedure: [name] DILATION;  Surgeon: Corbin Ade, MD;  [facility];  Service: Endoscopy;  Laterality: N/A;   PARTIAL COLECTOMY  08/30/2015   polyp with adenocarcinoma   PARTIAL COLECTOMY  08/29/2018   POLYPECTOMY N/A 07/04/2015   Procedure: POLYPECTOMY;  Surgeon: Corbin Ade, MD;  Location: [facility];  Service: Endoscopy;  Laterality: N/A;   PORTACATH PLACEMENT Right 09/21/2014   TUBAL LIGATION      Current Medications: Current Facility-Administered Medications  Medication Dose Route Frequency Provider Last Rate Last Admin   acetaminophen (TYLENOL) tablet 650 mg  650 mg Oral Q6H PRN Jearld Lesch, NP   650 mg at 10/20/23 0913   albuterol (VENTOLIN HFA) 108 (90 Base) MCG/ACT inhaler 2 puff  2 puff Inhalation Q2H PRN Jearld Lesch, NP       alum & mag hydroxide-simeth (MAALOX/MYLANTA) 200-200-20 MG/5ML suspension 30 mL  30 mL Oral Q4H PRN Jearld Lesch, NP   30 mL at 10/19/23 1754   aspirin EC tablet 81 mg  81 mg Oral Daily Dixon, Rashaun M, NP   81 mg at 10/20/23 0909   atorvastatin (LIPITOR) tablet 40 mg  40 mg Oral Daily Dixon, Rashaun M, NP   40 mg at 10/20/23 0908   brinzolamide (AZOPT) 1 % ophthalmic suspension 1 drop  1 drop Left Eye QHS Dixon, Rashaun M, NP   1 drop at 10/19/23 2131   And   brimonidine (ALPHAGAN) 0.2 % ophthalmic solution 1 drop  1 drop Left Eye QHS Dixon, Rashaun M, NP   1 drop at 10/19/23 2131   busPIRone (BUSPAR) tablet 10 mg  10 mg Oral BID Verner Chol, MD   10 mg at 10/20/23 9629   clopidogrel (PLAVIX) tablet 75 mg  75 mg Oral Daily Lerry Liner M, NP   75 mg at 10/20/23 0908    diphenhydrAMINE (BENADRYL) capsule 25 mg  25 mg Oral Q6H PRN Lewanda Rife, MD   25 mg at 10/17/23 2159   docusate sodium (COLACE) capsule 100 mg  100 mg Oral BID Lewanda Rife, MD   100 mg at 10/18/23 2240   escitalopram (LEXAPRO) tablet 20 mg  20 mg Oral QHS Dixon, Rashaun M, NP   20 mg at 10/19/23 2123   feeding supplement (ENSURE ENLIVE / ENSURE PLUS) liquid 237 mL  237 mL Oral BID BM Lewanda Rife, MD   237 mL at 10/20/23 0913   ferrous sulfate tablet 325 mg  325 mg Oral QODAY Dixon, Rashaun M, NP   325 mg at 10/19/23 1052   fluticasone (FLONASE) 50 MCG/ACT nasal spray 1 spray  1 spray Each Nare Daily Jearld Lesch, NP   1 spray at 10/20/23 0908   gabapentin (NEURONTIN) capsule 100 mg  100 mg Oral Daily Lewanda Rife, MD   100 mg at 10/20/23 0909   gabapentin (NEURONTIN) capsule 200 mg  200 mg Oral QHS Dixon, Rashaun M, NP   200 mg at 10/19/23 2123   hydrOXYzine (ATARAX) tablet 25 mg  25 mg Oral Q6H PRN Jearld Lesch, NP   25 mg at 10/19/23 1755   latanoprost (XALATAN) 0.005 % ophthalmic solution 1 drop  1 drop Left Eye QHS Jearld Lesch, NP   1 drop at 10/19/23 2132   magnesium  hydroxide (MILK OF MAGNESIA) suspension 30 mL  30 mL Oral Daily PRN Durwin Nora, Rashaun M, NP       nicotine (NICODERM CQ - dosed in mg/24 hours) patch 21 mg  21 mg Transdermal Daily Dixon, Rashaun M, NP   21 mg at 10/20/23 0912   OLANZapine (ZYPREXA) injection 5 mg  5 mg Intramuscular TID PRN Jearld Lesch, NP       OLANZapine zydis (ZYPREXA) disintegrating tablet 5 mg  5 mg Oral TID PRN Jearld Lesch, NP       pantoprazole (PROTONIX) EC tablet 40 mg  40 mg Oral QAC breakfast Dixon, Rashaun M, NP   40 mg at 10/20/23 0909   traZODone (DESYREL) tablet 50 mg  50 mg Oral QHS Lewanda Rife, MD   50 mg at 10/19/23 2123    Lab Results: No results found for this or any previous visit (from the past 48 hours).  Blood Alcohol level:  Lab Results  Component Value Date   ETH <10 10/15/2023    ETH <10 09/29/2023    Metabolic Disorder Labs: Lab Results  Component Value Date   HGBA1C 5.9 (H) 05/03/2023   MPG 123 05/03/2023   No results found for: "PROLACTIN" Lab Results  Component Value Date   CHOL 149 05/03/2023   TRIG 66 05/03/2023   HDL 49 05/03/2023   CHOLHDL 3.0 05/03/2023   VLDL 13 05/03/2023   LDLCALC 87 05/03/2023    Physical Findings: AIMS:  , ,  ,  ,    CIWA:    COWS:      Psychiatric Specialty Exam:  Presentation  General Appearance:  Appropriate for Environment; Casual  Eye Contact: Fair  Speech: Clear and Coherent  Speech Volume: Normal    Mood and Affect  Mood: Anxious  Affect: Appropriate   Thought Process  Thought Processes: Coherent  Descriptions of Associations:Intact  Orientation:Full (Time, Place and Person)  Thought Content:Logical  Hallucinations:Hallucinations: None  Ideas of Reference:None  Suicidal Thoughts:Suicidal Thoughts: No  Homicidal Thoughts:Homicidal Thoughts: No   Sensorium  Memory: Immediate Fair; Recent Fair; Remote Fair  Judgment: Impaired  Insight: Shallow   Executive Functions  Concentration: Fair  Attention Span: Fair  Recall: [employer] of Knowledge: Fair  Language: Fair   Psychomotor Activity  Psychomotor Activity: Psychomotor Activity: Normal  Musculoskeletal: Strength & Muscle Tone: within normal limits Gait & Station: normal Assets  Assets: Manufacturing systems engineer; Desire for Improvement; Physical Health    Physical Exam: Physical Exam Vitals and nursing note reviewed.  HENT:     Head: Normocephalic.     Nose: Nose normal.     Mouth/Throat:     Mouth: Mucous membranes are moist.  Eyes:     Pupils: Pupils are equal, round, and reactive to light.  Cardiovascular:     Rate and Rhythm: Normal rate.     Pulses: Normal pulses.  Pulmonary:     Effort: Pulmonary effort is normal.  Abdominal:     General: Bowel sounds are normal.  Skin:    General:  Skin is warm.  Neurological:     General: No focal deficit present.     Mental Status: She is alert.    Review of Systems  Constitutional: Negative.   HENT: Negative.    Eyes: Negative.   Respiratory: Negative.    Cardiovascular: Negative.   Gastrointestinal: Negative.   Skin: Negative.   Neurological: Negative.    Blood pressure 122/61, pulse 63, temperature (!) 97.2 F (36.2  C), resp. rate 18, height 5\' 3"  (1.6 m), weight 46 kg, SpO2 99%. Body mass index is 17.98 kg/m.  Diagnosis: Principal Problem:   MDD (major depressive disorder) Active Problems:   PTSD (post-traumatic stress disorder)   PLAN: Safety and Monitoring:  -- Voluntary admission to inpatient psychiatric unit for safety, stabilization and treatment  -- Daily contact with patient to assess and evaluate symptoms and progress in treatment  -- Patient's case to be discussed in multi-disciplinary team meeting  -- Observation Level : q15 minute checks  -- Vital signs:  q12 hours  -- Precautions: suicide, elopement, and assault  2. Psychiatric Diagnoses and Treatment:  Lexapro 20 mg by mouth daily for depression and anxiety -Gabapentin 100 mg in the morning and 200 mg at bedtime for pain -Trazodone 50 mg at bedtime  -BuSpar and dose  10 mg twice daily to help with the anxiety  -- Encouraged patient to participate in unit milieu and in scheduled group therapies      3. Medical Issues Being Addressed: see h&P    4. Discharge Planning:   -- Social work and case management to assist with discharge planning and identification of hospital follow-up needs prior to discharge  -- Estimated LOS: 3-4 days  Verner Chol, MD 10/20/2023, 11:41 AM

## 2023-10-21 NOTE — Progress Notes (Signed)
   10/21/23 0735  Psych Admission Type (Psych Patients Only)  Admission Status Voluntary  Psychosocial Assessment  Patient Complaints Anxiety  Eye Contact Fair  Facial Expression Anxious  Affect Appropriate to circumstance  Speech Logical/coherent  Interaction Needy  Motor Activity Slow  Appearance/Hygiene Unremarkable  Behavior Characteristics Cooperative  Mood Anxious  Thought Process  Coherency WDL  Content WDL  Delusions None reported or observed  Perception WDL  Hallucination None reported or observed  Judgment WDL  Confusion None  Danger to Self  Current suicidal ideation? Denies  Danger to Others  Danger to Others None reported or observed

## 2023-10-21 NOTE — Plan of Care (Signed)
Problem: Education: Goal: Knowledge of General Education information will improve Description: Including pain rating scale, medication(s)/side effects and non-pharmacologic comfort measures Outcome: Progressing   Problem: Clinical Measurements: Goal: Ability to maintain clinical measurements within normal limits will improve Outcome: Progressing Goal: Cardiovascular complication will be avoided Outcome: Progressing    Problem: Activity: Goal: Risk for activity intolerance will decrease Outcome: Progressing   Problem: Nutrition: Goal: Adequate nutrition will be maintained Outcome: Progressing   Problem: Coping: Goal: Level of anxiety will decrease Outcome: Progressing

## 2023-10-21 NOTE — BHH Counselor (Signed)
CSW to contact Lurena Joiner from [facility] APS to inform Lurena Joiner that pt wants to discharge home so that Lurena Joiner can plan accordingly for a home visit.   Reynaldo Minium, MSW, [place] 10/22/2023 12:33 PM

## 2023-10-21 NOTE — BHH Counselor (Signed)
CSW met with social services caseworker Asher Muir, who completed assessment with pt based off of APS report.   Asher Muir reports despite pt denying being in danger, that pt will be getting a caseworker that follows her after discharge.   Asher Muir reports that that caseworkers name is Salvadore Farber.   CSW will inform care team of updates on pt.   Reynaldo Minium, MSW, [place] 10/21/2023 9:56 AM

## 2023-10-21 NOTE — Group Note (Signed)
Date:  10/21/2023 Time:  8:41 PM  Group Topic/Focus:  Goals Group:   The focus of this group is to help patients establish daily goals to achieve during treatment and discuss how the patient can incorporate goal setting into their daily lives to aide in recovery.    Participation Level:  Active  Participation Quality:  Appropriate  Affect:  Appropriate  Cognitive:  Appropriate  Insight: Appropriate  Engagement in Group:  Engaged  Modes of Intervention:  Discussion  Additional Comments:    Burt Ek 10/21/2023, 8:41 PM

## 2023-10-21 NOTE — Progress Notes (Signed)
[facility] MD Progress Note  10/21/2023 11:48 AM Julia FRISCIA  MRN:  [id number]  Patient is a  63 yo female who presented to emergency department for generalized weakness and tremors. Pt state that she fell yesterday and hit her head; she reported feeling suicidal; states she wants to cut her wrist as she is tired of the stress and the way she is living. Reports feels unsafe to return home  Subjective:  Chart reviewed, case discussed in multidisciplinary meeting, patient seen during rounds.  Today on interview patient is noted to be sitting in the day area.  She reports that the APS caseworker came by and talk to her today she understands a safety concern that prompted the mandatory reporting to APS as patient stated that her son gets very upset if she makes any sound at the house that she stopped cooking, unable to eat proper meals at home, unable to pay bills because he refuses to pay bills and lives at her house at her expense and she reported losing 40 pounds in the last few months not having proper food.  APS has informed our social work team that they will be appointing a prominent case manager to work with the patient and they will be doing the home inspection.  Will reach out for APS to clear patient going home as a safe discharge.  Patient reports feeling anxious about her discharge but denies any active SI/HI/plan.  She denies auditory/visual hallucinations.  It is a requirement to get clearance from APS about safety of the house for patient to return. Sleep: Fair  Appetite:  Fair  Past Psychiatric History: see h&P Family History:  Family History  Problem Relation Age of Onset   Other Mother        clot that went to heart, deceased age 61ss   Heart attack Father        age 34s, deceased   Stroke Father    Cancer Father        "at death determined he was ate up with cancer"   Hypertension Sister    Kidney cancer Sister 72   Diabetes Brother    Hypertension Brother    Endometriosis  Daughter    Heart attack Maternal Grandfather    Diabetes Sister    Brain cancer Paternal Aunt    Cancer Paternal Uncle        NOS   Cancer Paternal Uncle        NOS   Cancer Paternal Uncle        NOS   Colon cancer Neg Hx    Social History:  Social History   Substance and Sexual Activity  Alcohol Use No     Social History   Substance and Sexual Activity  Drug Use No    Social History   Socioeconomic History   Marital status: Widowed    Spouse name: Not on file   Number of children: 2   Years of education: Not on file   Highest education level: Not on file  Occupational History   Not on file  Tobacco Use   Smoking status: Never   Smokeless tobacco: Never  Vaping Use   Vaping status: Never Used  Substance and Sexual Activity   Alcohol use: No   Drug use: No   Sexual activity: Not Currently    Birth control/protection: Surgical    Comment: hyst  Other Topics Concern   Not on file  Social History Narrative   ** Merged  History Encounter **       ** Data from: 04/28/23 Enc Dept: [facility]-PRE ADM TESTING   #  [place]; smoking- 1/2 ppd; no alcohol; used to run machines;   FHx-Dad- MI; ? Cancer on autopsy; sisters/aunts- brain; lung cancer x2; oldest sister- kidney cancer  Pt has 57 year old son;daughter 33 years. Brothers- states to have s   poken to her family re: importance of them being checked for lynch.        ** Data from: 05/04/23 Enc Dept: [facility]   Lives alone with 4 large dogs.    Social Drivers of Health   Financial Resource Strain: Medium Risk (12/07/2022)   Overall Financial Resource Strain (CARDIA)    Difficulty of Paying Living Expenses: Somewhat hard  Food Insecurity: Food Insecurity Present (10/16/2023)   Hunger Vital Sign    Worried About Running Out of Food in the Last Year: Sometimes true    Ran Out of Food in the Last Year: Sometimes true  Transportation Needs: Unmet Transportation Needs (10/16/2023)   PRAPARE -  Administrator, Civil Service (Medical): Yes    Lack of Transportation (Non-Medical): Yes  Physical Activity: Inactive (12/07/2022)   Exercise Vital Sign    Days of Exercise per Week: 0 days    Minutes of Exercise per Session: 0 min  Stress: Stress Concern Present (12/07/2022)   [employer] of Occupational Health - Occupational Stress Questionnaire    Feeling of Stress : To some extent  Social Connections: Socially Isolated (10/16/2023)   Social Connection and Isolation Panel [NHANES]    Frequency of Communication with Friends and Family: More than three times a week    Frequency of Social Gatherings with Friends and Family: More than three times a week    Attends Religious Services: Never    Database administrator or Organizations: No    Attends Banker Meetings: Never    Marital Status: Widowed   Past Medical History:  Past Medical History:  Diagnosis Date   Abdominal pain, acute, left upper quadrant    Abdominal pain, epigastric    Abnormal CT scan, sigmoid colon    Abnormal vaginal Pap smear    Adenocarcinoma of colon (HCC) 09/13/2015   Partial colon resection and chemo tx's.    Anemia    Anxiety    Anxiety disorder, unspecified    Aphasia    Asthma    Cancer (HCC)    endometrial; cancer cells in intestine   Chronic pain of left knee    Colon neoplasm    Constipation    Conversion reaction    COPD (chronic obstructive pulmonary disease) (HCC)    no definite diagnosis   Depression    Diverticulitis    Diverticulosis of colon without hemorrhage    Dyspareunia 05/16/2015   Dysphasia    Endometrial cancer (HCC)    Esophageal dysphagia    Family history of cancer    Family history of kidney cancer    Ganglion of left knee    Gastric intestinal metaplasia    GERD (gastroesophageal reflux disease)    Glaucoma    Hematochezia    History of colonic polyps    Hyperlipidemia    Indigestion    Lynch syndrome    Mucosal abnormality of  stomach    Nausea and vomiting    Neuropathy    feet and hands   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 drug (HCC)    PONV (postoperative nausea  and vomiting)    Pre-diabetes    Primary osteoarthritis of left knee    Rectal bleeding    Reflux esophagitis    Soft tissue swelling of knee joint    Uterine fibroid    UTI (urinary tract infection) 04/26/2023   on cipro   Vaginal dryness 05/16/2015   Vaginal itching    Vaginal Pap smear, abnormal     Past Surgical History:  Procedure Laterality Date   ABDOMINAL HYSTERECTOMY     APPENDECTOMY     BIOPSY N/A 03/14/2015   Procedure: BIOPSY;  Surgeon: Corbin Ade, MD;  Location: [facility];  Service: Endoscopy;  Laterality: N/A;  Gastric   COLONOSCOPY N/A 01/28/2017   Procedure: COLONOSCOPY;  Surgeon: Corbin Ade, MD;  Location[facility];  Service: Endoscopy;  Laterality: N/A;  11:30am   COLONOSCOPY WITH PROPOFOL N/A 03/14/2015   RMR: Internal hemorrhoids. colonic diverticulosis. Incomplete examination. Prepartation inadequate.   COLONOSCOPY WITH PROPOFOL N/A 07/04/2015   RMR: Colonic diverticulosis . Large polypoid lesion in the vicinity of the hepatic flexure status post saline-assisted piecmeal snare polypectomy  with ablation and tattooing as described. Sigmoid polyp removed as described above. sigmoid colon polyp hyperplastic, hepatic flexure polyp with TA with focal high grade dysplasia    COLONOSCOPY WITH PROPOFOL N/A 11/03/2018   Procedure: COLONOSCOPY WITH PROPOFOL;  Surgeon: Wyline Mood, MD;  [facility]COPY;  Service: Gastroenterology;  Laterality: N/A;   COLONOSCOPY WITH PROPOFOL N/A 12/09/2020   Procedure: COLONOSCOPY WITH PROPOFOL;  Surgeon: Wyline Mood, MD;  Location: [facility] ENDOSCOPY;  Service: Gastroenterology;  Laterality: N/A;  COVID POSITIVE 10/30/2020   COLONOSCOPY WITH PROPOFOL N/A 01/20/2021   Procedure: COLONOSCOPY WITH PROPOFOL;  Surgeon: Wyline Mood, MD;  Location: [facility] ENDOSCOPY;  Service:  Gastroenterology;  Laterality: N/A;   COLONOSCOPY WITH PROPOFOL N/A 12/10/2021   Procedure: COLONOSCOPY WITH PROPOFOL;  Surgeon: Wyline Mood, MD;  Location: [facility] ENDOSCOPY;  Service: Gastroenterology;  Laterality: N/A;   COLONOSCOPY WITH PROPOFOL N/A 02/16/2023   Procedure: COLONOSCOPY WITH PROPOFOL;  Surgeon: Wyline Mood, MD;  Location: [facility] ENDOSCOPY;  Service: Gastroenterology;  Laterality: N/A;   ENTEROSCOPY N/A 02/12/2020   Procedure: ENTEROSCOPY;  Surgeon: Wyline Mood, MD;  Location: [facility] ENDOSCOPY;  Service: Gastroenterology;  Laterality: N/A;   ESOPHAGEAL DILATION N/A 03/14/2015   Procedure: ESOPHAGEAL DILATION;  Surgeon: Corbin Ade, MD;  Location: [facility];  Service: Endoscopy;  Laterality: N/AElease Hashimoto 54   ESOPHAGOGASTRODUODENOSCOPY N/A 05/19/2016   Procedure: ESOPHAGOGASTRODUODENOSCOPY (EGD);  Surgeon: Corbin Ade, MD;  [facility];  Service: Endoscopy;  Laterality: N/A;  215   ESOPHAGOGASTRODUODENOSCOPY N/A 01/28/2017   Procedure: ESOPHAGOGASTRODUODENOSCOPY (EGD);  Surgeon: Corbin Ade, MD;  [facility];  Service: Endoscopy;  Laterality: N/A;   ESOPHAGOGASTRODUODENOSCOPY N/A 12/10/2021   Procedure: ESOPHAGOGASTRODUODENOSCOPY (EGD);  Surgeon: Wyline Mood, MD;  Location: [facility] ENDOSCOPY;  Service: Gastroenterology;  Laterality: N/A;   ESOPHAGOGASTRODUODENOSCOPY (EGD) WITH PROPOFOL N/A 03/14/2015   RMR: Mild erosive reflux esophagitis status post passage o f a Maloney dilator. Abnormal gastric mucosa of uncertain significance as described above. status post biopsy, benign   ESOPHAGOGASTRODUODENOSCOPY (EGD) WITH PROPOFOL N/A 11/18/2017   Procedure: ESOPHAGOGASTRODUODENOSCOPY (EGD) WITH PROPOFOL;  Surgeon: Corbin Ade, MD;  [facility];  Service: Endoscopy;  Laterality: N/A;  12:15pm   ESOPHAGOGASTRODUODENOSCOPY (EGD) WITH PROPOFOL N/A 11/03/2018   Procedure: ESOPHAGOGASTRODUODENOSCOPY (EGD) WITH PROPOFOL;  Surgeon: Wyline Mood, MD;   Location: [facility] ENDOSCOPY;  Service: Gastroenterology;  Laterality: N/A;  ESOPHAGOGASTRODUODENOSCOPY (EGD) WITH PROPOFOL N/A 02/16/2023   Procedure: ESOPHAGOGASTRODUODENOSCOPY (EGD) WITH PROPOFOL;  Surgeon: Wyline Mood, MD;  Location: [facility] ENDOSCOPY;  Service: Gastroenterology;  Laterality: N/A;   GIVENS CAPSULE STUDY N/A 12/25/2019   Procedure: GIVENS CAPSULE STUDY;  Surgeon: Wyline Mood, MD;  Location: [facility] ENDOSCOPY;  Service: Gastroenterology;  Laterality: N/A;   MALONEY DILATION N/A 05/19/2016   Procedure: [name] DILATION;  Surgeon: Corbin Ade, MD;  [facility];  Service: Endoscopy;  Laterality: N/A;   MALONEY DILATION N/A 01/28/2017   Procedure: [name] DILATION;  Surgeon: Corbin Ade, MD;  [facility];  Service: Endoscopy;  Laterality: N/A;   MALONEY DILATION N/A 11/18/2017   Procedure: [name] DILATION;  Surgeon: Corbin Ade, MD;  Location[facility];  Service: Endoscopy;  Laterality: N/A;   PARTIAL COLECTOMY  08/30/2015   polyp with adenocarcinoma   PARTIAL COLECTOMY  08/29/2018   POLYPECTOMY N/A 07/04/2015   Procedure: POLYPECTOMY;  Surgeon: Corbin Ade, MD;  Location: [facility];  Service: Endoscopy;  Laterality: N/A;   PORTACATH PLACEMENT Right 09/21/2014   TUBAL LIGATION      Current Medications: Current Facility-Administered Medications  Medication Dose Route Frequency Provider Last Rate Last Admin   acetaminophen (TYLENOL) tablet 650 mg  650 mg Oral Q6H PRN Jearld Lesch, NP   650 mg at 10/20/23 0913   albuterol (VENTOLIN HFA) 108 (90 Base) MCG/ACT inhaler 2 puff  2 puff Inhalation Q2H PRN Jearld Lesch, NP       alum & mag hydroxide-simeth (MAALOX/MYLANTA) 200-200-20 MG/5ML suspension 30 mL  30 mL Oral Q4H PRN Jearld Lesch, NP   30 mL at 10/19/23 1754   aspirin EC tablet 81 mg  81 mg Oral Daily Dixon, Rashaun M, NP   81 mg at 10/21/23 0945   atorvastatin (LIPITOR) tablet 40 mg  40 mg Oral Daily Dixon, Rashaun M, NP   40 mg  at 10/21/23 0945   brinzolamide (AZOPT) 1 % ophthalmic suspension 1 drop  1 drop Left Eye QHS Dixon, Rashaun M, NP   1 drop at 10/20/23 2138   And   brimonidine (ALPHAGAN) 0.2 % ophthalmic solution 1 drop  1 drop Left Eye QHS Dixon, Rashaun M, NP   1 drop at 10/20/23 2138   busPIRone (BUSPAR) tablet 10 mg  10 mg Oral BID Verner Chol, MD   10 mg at 10/21/23 0945   clopidogrel (PLAVIX) tablet 75 mg  75 mg Oral Daily Lerry Liner M, NP   75 mg at 10/21/23 0945   diphenhydrAMINE (BENADRYL) capsule 25 mg  25 mg Oral Q6H PRN Lewanda Rife, MD   25 mg at 10/20/23 1355   docusate sodium (COLACE) capsule 100 mg  100 mg Oral BID Lewanda Rife, MD   100 mg at 10/18/23 2240   escitalopram (LEXAPRO) tablet 20 mg  20 mg Oral QHS Dixon, Rashaun M, NP   20 mg at 10/20/23 2054   feeding supplement (ENSURE ENLIVE / ENSURE PLUS) liquid 237 mL  237 mL Oral BID BM Lewanda Rife, MD   237 mL at 10/21/23 0953   ferrous sulfate tablet 325 mg  325 mg Oral QODAY Dixon, Rashaun M, NP   325 mg at 10/21/23 0945   fluticasone (FLONASE) 50 MCG/ACT nasal spray 1 spray  1 spray Each Nare Daily Jearld Lesch, NP   1 spray at 10/21/23 0945   gabapentin (NEURONTIN) capsule 100 mg  100 mg Oral Daily Parmar,  Meenakshi, MD   100 mg at 10/21/23 0945   gabapentin (NEURONTIN) capsule 200 mg  200 mg Oral QHS Dixon, Rashaun M, NP   200 mg at 10/20/23 2055   hydrOXYzine (ATARAX) tablet 25 mg  25 mg Oral Q6H PRN Jearld Lesch, NP   25 mg at 10/19/23 1755   latanoprost (XALATAN) 0.005 % ophthalmic solution 1 drop  1 drop Left Eye QHS Jearld Lesch, NP   1 drop at 10/20/23 2138   lip balm (BLISTEX) ointment   Topical PRN Hunt, Madison H, RPH       magnesium hydroxide (MILK OF MAGNESIA) suspension 30 mL  30 mL Oral Daily PRN Jearld Lesch, NP       nicotine (NICODERM CQ - dosed in mg/24 hours) patch 21 mg  21 mg Transdermal Daily Durwin Nora, Rashaun M, NP   21 mg at 10/21/23 0953   OLANZapine (ZYPREXA) injection 5 mg  5  mg Intramuscular TID PRN Jearld Lesch, NP       OLANZapine zydis (ZYPREXA) disintegrating tablet 5 mg  5 mg Oral TID PRN Jearld Lesch, NP       pantoprazole (PROTONIX) EC tablet 40 mg  40 mg Oral QAC breakfast Dixon, Rashaun M, NP   40 mg at 10/21/23 0945   traZODone (DESYREL) tablet 50 mg  50 mg Oral QHS Lewanda Rife, MD   50 mg at 10/20/23 2055    Lab Results: No results found for this or any previous visit (from the past 48 hours).  Blood Alcohol level:  Lab Results  Component Value Date   ETH <10 10/15/2023   ETH <10 09/29/2023    Metabolic Disorder Labs: Lab Results  Component Value Date   HGBA1C 5.9 (H) 05/03/2023   MPG 123 05/03/2023   No results found for: "PROLACTIN" Lab Results  Component Value Date   CHOL 149 05/03/2023   TRIG 66 05/03/2023   HDL 49 05/03/2023   CHOLHDL 3.0 05/03/2023   VLDL 13 05/03/2023   LDLCALC 87 05/03/2023    Physical Findings: AIMS:  , ,  ,  ,    CIWA:    COWS:      Psychiatric Specialty Exam:  Presentation  General Appearance:  Appropriate for Environment; Casual  Eye Contact: Minimal  Speech: Slow  Speech Volume: Decreased    Mood and Affect  Mood: Anxious  Affect: Flat   Thought Process  Thought Processes: Coherent  Descriptions of Associations:Intact  Orientation:Full (Time, Place and Person)  Thought Content:Illogical  Hallucinations:Hallucinations: None  Ideas of Reference:None  Suicidal Thoughts:Suicidal Thoughts: No  Homicidal Thoughts:Homicidal Thoughts: No   Sensorium  Memory: Immediate Fair; Recent Fair; Remote Fair  Judgment: Impaired  Insight: Shallow   Executive Functions  Concentration: Poor  Attention Span: Poor  Recall: [employer] of Knowledge: Fair  Language: Fair   Psychomotor Activity  Psychomotor Activity: Psychomotor Activity: Normal  Musculoskeletal: Strength & Muscle Tone: within normal limits Gait & Station: normal Assets   Assets: Manufacturing systems engineer; Desire for Improvement; Physical Health    Physical Exam: Physical Exam Vitals and nursing note reviewed.  HENT:     Head: Normocephalic.     Nose: Nose normal.     Mouth/Throat:     Mouth: Mucous membranes are moist.  Eyes:     Pupils: Pupils are equal, round, and reactive to light.  Cardiovascular:     Rate and Rhythm: Normal rate.     Pulses: Normal pulses.  Pulmonary:     Effort: Pulmonary effort is normal.  Abdominal:     General: Bowel sounds are normal.  Skin:    General: Skin is warm.  Neurological:     General: No focal deficit present.     Mental Status: She is alert.    Review of Systems  Constitutional: Negative.   HENT: Negative.    Eyes: Negative.   Respiratory: Negative.    Cardiovascular: Negative.   Gastrointestinal: Negative.   Skin: Negative.   Neurological: Negative.    Blood pressure (!) 135/56, pulse 65, temperature (!) 97.4 F (36.3 C), resp. rate 16, height 5\' 3"  (1.6 m), weight 46 kg, SpO2 100%. Body mass index is 17.98 kg/m.  Diagnosis: Principal Problem:   MDD (major depressive disorder) Active Problems:   PTSD (post-traumatic stress disorder)   PLAN: Safety and Monitoring:  -- Voluntary admission to inpatient psychiatric unit for safety, stabilization and treatment  -- Daily contact with patient to assess and evaluate symptoms and progress in treatment  -- Patient's case to be discussed in multi-disciplinary team meeting  -- Observation Level : q15 minute checks  -- Vital signs:  q12 hours  -- Precautions: suicide, elopement, and assault  2. Psychiatric Diagnoses and Treatment:  Lexapro 20 mg by mouth daily for depression and anxiety -Gabapentin 100 mg in the morning and 200 mg at bedtime for pain -Trazodone 50 mg at bedtime  -BuSpar and dose  10 mg twice daily to help with the anxiety  -- Encouraged patient to participate in unit milieu and in scheduled group therapies      3. Medical Issues  Being Addressed: see h&P    4. Discharge Planning: APS caseworker came back and evaluated the patient.  They will be letting the team know if housing is cleared for patient safe discharge return back home.  -- Social work and case management to assist with discharge planning and identification of hospital follow-up needs prior to discharge  -- Estimated LOS: 1-2ays  Verner Chol, MD 10/21/2023, 11:48 AM

## 2023-10-21 NOTE — Progress Notes (Signed)
   10/21/23 0545  15 Minute Checks  Location Bedroom  Visual Appearance Calm  Behavior Sleeping  Sleep (Behavioral Health Patients Only)  Calculate sleep? (Click Yes once per 24 hr at 0600 safety check) Yes  Documented sleep last 24 hours 9.5

## 2023-10-21 NOTE — BHH Counselor (Signed)
CSW received confirmation via phone and email from [name] that [name] will be screened in.   Julia Osborne, MSW, [place] 10/21/2023 8:39 AM

## 2023-10-21 NOTE — Group Note (Signed)
Recreation Therapy Group Note   Group Topic:Self-Esteem  Group Date: 10/21/2023 Start Time: 1400 End Time: 1500 Facilitators: Rosina Lowenstein, LRT, CTRS Location:  Dayroom  Group Description: Positive Affirmation Worksheet. Patients and LRT discussed the importance of self-love/self-esteem and things that cause it to fluctuate, including our mental health. Pt completed a worksheet that helps them identify 24 different strengths and qualities about themselves. Pt encouraged to read aloud at least 2 off their sheet to the group. LRT and pts discussed how this can be applied to daily life post-discharge. LRT and patients played positive affirmation bingo afterwards.  Goal Area(s) Addressed: Patient will identify positive qualities about themselves. Patient will learn new positive affirmations.  Patient will recite positive qualities and affirmations aloud to the group.  Patient will practice positive self-talk.  Patient will increase communication.   Affect/Mood: N/A   Participation Level: Did not attend    Clinical Observations/Individualized Feedback: Patient did not attend group.   Plan: Continue to engage patient in RT group sessions 2-3x/week.   Rosina Lowenstein, LRT, CTRS 10/21/2023 5:12 PM

## 2023-10-22 MED ORDER — ENSURE ENLIVE PO LIQD: 237.0000 mL | Freq: Two times a day (BID) | ORAL | 12 refills | Status: DC

## 2023-10-22 MED ORDER — NICOTINE 21 MG/24HR TD PT24: 21.0000 mg | MEDICATED_PATCH | Freq: Every day | TRANSDERMAL | 0 refills | Status: DC

## 2023-10-22 MED ORDER — TRAZODONE HCL 50 MG PO TABS: 50.0000 mg | ORAL_TABLET | Freq: Every day | ORAL | 0 refills | Status: DC

## 2023-10-22 MED ORDER — ASPIRIN 81 MG PO TBEC: 81.0000 mg | DELAYED_RELEASE_TABLET | Freq: Every day | ORAL | 12 refills | Status: DC

## 2023-10-22 MED ORDER — LATANOPROST 0.005 % OP SOLN: 1.0000 [drp] | Freq: Every day | OPHTHALMIC | 12 refills | Status: DC

## 2023-10-22 MED ORDER — FERROUS SULFATE 325 (65 FE) MG PO TABS: 325.0000 mg | ORAL_TABLET | ORAL | 0 refills | Status: DC

## 2023-10-22 MED ORDER — CLOPIDOGREL BISULFATE 75 MG PO TABS: 75.0000 mg | ORAL_TABLET | Freq: Every day | ORAL | 0 refills | Status: DC

## 2023-10-22 MED ORDER — DOCUSATE SODIUM 100 MG PO CAPS: 100.0000 mg | ORAL_CAPSULE | Freq: Two times a day (BID) | ORAL | 0 refills | Status: DC

## 2023-10-22 MED ORDER — GABAPENTIN 100 MG PO CAPS: 200.0000 mg | ORAL_CAPSULE | Freq: Every day | ORAL | 0 refills | Status: DC

## 2023-10-22 MED ORDER — BRINZOLAMIDE 1 % OP SUSP: 1.0000 [drp] | Freq: Every day | OPHTHALMIC | 12 refills | Status: DC

## 2023-10-22 MED ORDER — BUSPIRONE HCL 10 MG PO TABS: 10.0000 mg | ORAL_TABLET | Freq: Two times a day (BID) | ORAL | 0 refills | Status: DC

## 2023-10-22 MED ORDER — BRIMONIDINE TARTRATE 0.2 % OP SOLN: 1.0000 [drp] | Freq: Every day | OPHTHALMIC | 12 refills | Status: DC

## 2023-10-22 MED ORDER — ESCITALOPRAM OXALATE 20 MG PO TABS: 20.0000 mg | ORAL_TABLET | Freq: Every day | ORAL | 0 refills | Status: DC

## 2023-10-22 MED ORDER — GABAPENTIN 100 MG PO CAPS: 100.0000 mg | ORAL_CAPSULE | Freq: Every day | ORAL | 0 refills | Status: DC

## 2023-10-22 NOTE — Progress Notes (Signed)
  [facility] Adult Case Management Discharge Plan :  Will you be returning to the same living situation after discharge:  Yes,  pt will return home At discharge, do you have transportation home?: Yes,  CSW will assist with transportation  Do you have the ability to pay for your medications: Yes,  HUMANA MEDICARE / HUMANA MEDICARE HMO  Release of information consent forms completed and in the chart;  Patient's signature needed at discharge.  Patient to Follow up at:  Follow-up Information     [facility] Follow up on 10/28/2023.   Why: Your appointment is scheduled for February 6th at 1:30 PM. Please bring your new patient information packet                Next level of care provider has access to [facility] Link:no  Safety Planning and Suicide Prevention discussed: Yes,  pt provided a contact that she later revealed was causing her anxiety and distress. Because of this CSW went over SPE brochure with pt instead     Has patient been referred to the Quitline?: Patient does not use tobacco/nicotine products  Patient has been referred for addiction treatment: No known substance use disorder.  [address] LCSWA 10/22/2023, 12:29 PM

## 2023-10-22 NOTE — Progress Notes (Signed)
Assumed care of patient this pm, she present A&O x 4, affect & mood seemed sad and depressed. Pt out in the milieu, sociable, attended group, behavior appropriate to situation and was with/without complaints.  Pt focused on been discharged tomorrow and stated she's ready. Pt denied thoughts, plan or intent to harm self or others and made a safety commitment. Patient compliant with taking medications and following treatment goals. Pt educated on plan of care, medication regimen and she acknowledged an understanding and was without further complaints or concerns. Pt is managed on q 15 min rounds

## 2023-10-22 NOTE — Progress Notes (Addendum)
Patient ID: Julia Osborne, female   DOB: 10/15/1960, 63 y.o.   MRN: [id number]  Patient was discharged from [facility] unit at approx 1355 escorted by staff. Patient denies SI/HI/AVH. Discharge packet to include printed AVS, printed prescriptions, Suicide Risk Assessment, and Transition Record reviewed with patient. Belongings to include white purse returned and patient verified receipt with signature. Suicide safety plan completed with a copy kept in chart.

## 2023-10-22 NOTE — B[facility] Suicide Risk Assessment (Signed)
[facility] Discharge Suicide Risk Assessment   Principal Problem: MDD (major depressive disorder) Discharge Diagnoses: Principal Problem:   MDD (major depressive disorder) Active Problems:   PTSD (post-traumatic stress disorder)   Total Time spent with patient: 30 minutes  Musculoskeletal: Strength & Muscle Tone: within normal limits Gait & Station: normal Patient leans: N/A  Psychiatric Specialty Exam  Presentation  General Appearance:  Appropriate for Environment; Casual  Eye Contact: Minimal  Speech: Slow  Speech Volume: Decreased  Handedness: Right   Mood and Affect  Mood: Anxious  Duration of Depression Symptoms: No data recorded Affect: Flat   Thought Process  Thought Processes: Coherent  Descriptions of Associations:Intact  Orientation:Full (Time, Place and Person)  Thought Content:Illogical  History of Schizophrenia/Schizoaffective disorder:No  Duration of Psychotic Symptoms:N/A  Hallucinations:Hallucinations: None  Ideas of Reference:None  Suicidal Thoughts:Suicidal Thoughts: No  Homicidal Thoughts:Homicidal Thoughts: No   Sensorium  Memory: Immediate Fair; Recent Fair; Remote Fair  Judgment: Impaired  Insight: Shallow   Executive Functions  Concentration: Poor  Attention Span: Poor  Recall: [employer] of Knowledge: Fair  Language: Fair   Psychomotor Activity  Psychomotor Activity: Psychomotor Activity: Normal   Assets  Assets: Communication Skills; Desire for Improvement; Physical Health   Sleep  Sleep: Sleep: Fair   Physical Exam: Physical Exam ROS Blood pressure (!) 124/52, pulse 71, temperature 97.9 F (36.6 C), resp. rate 18, height 5\' 3"  (1.6 m), weight 46 kg, SpO2 100%. Body mass index is 17.98 kg/m.  Mental Status Per Nursing Assessment::   On Admission:  Suicidal ideation indicated by patient  Demographic Factors:  Divorced or widowed and Caucasian  Loss Factors: Financial  problems/change in socioeconomic status  Historical Factors: NA  Risk Reduction Factors:   Positive social support and Positive coping skills or problem solving skills  Continued Clinical Symptoms:  Previous Psychiatric Diagnoses and Treatments  Cognitive Features That Contribute To Risk:  Thought constriction (tunnel vision)    Suicide Risk:  Minimal: No identifiable suicidal ideation.  Patients presenting with no risk factors but with morbid ruminations; may be classified as minimal risk based on the severity of the depressive symptoms   Follow-up Information     [facility] Follow up on 10/28/2023.   Why: Your appointment is scheduled for February 6th at 1:30 PM. Please bring your new patient information packet                Plan Of Care/Follow-up recommendations:  Activity:  as tolerated  Verner Chol, MD 10/22/2023, 1:02 PM

## 2023-10-22 NOTE — B[facility] Counselor (Signed)
CSW contacted Julia Osborne from [facility] to get Julia Osborne's phone number.   Julia Osborne's phone number is [phone].    CSW contacted Julia Osborne at [phone].  CSW left HIPAA compliant VM requesting return call.   Julia Osborne, MSW, [place] 10/22/2023 12:35 PM

## 2023-10-22 NOTE — Plan of Care (Signed)
  Problem: Education: Goal: Knowledge of General Education information will improve Description: Including pain rating scale, medication(s)/side effects and non-pharmacologic comfort measures Outcome: Adequate for Discharge   Problem: Health Behavior/Discharge Planning: Goal: Ability to manage health-related needs will improve Outcome: Adequate for Discharge   Problem: Clinical Measurements: Goal: Ability to maintain clinical measurements within normal limits will improve Outcome: Adequate for Discharge Goal: Cardiovascular complication will be avoided Outcome: Adequate for Discharge   Problem: Activity: Goal: Risk for activity intolerance will decrease Outcome: Adequate for Discharge   Problem: Nutrition: Goal: Adequate nutrition will be maintained Outcome: Adequate for Discharge   Problem: Coping: Goal: Level of anxiety will decrease Outcome: Adequate for Discharge

## 2023-10-22 NOTE — Discharge Summary (Signed)
Physician Discharge Summary Note  Patient:  Julia Osborne is an 63 y.o., female MRN:  [id number] DOB:  23-Aug-1961 Patient phone:  [phone] (home)  Patient address:   [address] [place] [address],    Date of Admission:  10/16/2023 Date of Discharge: 10/22/23  Reason for Admission:  Patient is a  63 yo female who presented to emergency department for generalized weakness and tremors. Pt state that she fell yesterday and hit her head; she reported feeling suicidal; states she wants to cut her wrist as she is tired of the stress and the way she is living. Reports feels unsafe to return home   Principal Problem: MDD (major depressive disorder) Discharge Diagnoses: Principal Problem:   MDD (major depressive disorder) Active Problems:   PTSD (post-traumatic stress disorder)   Past Psychiatric History: see h&p  Family Psychiatric  History: see h&p Social History:  Social History   Substance and Sexual Activity  Alcohol Use No     Social History   Substance and Sexual Activity  Drug Use No    Social History   Socioeconomic History   Marital status: Widowed    Spouse name: Not on file   Number of children: 2   Years of education: Not on file   Highest education level: Not on file  Occupational History   Not on file  Tobacco Use   Smoking status: Never   Smokeless tobacco: Never  Vaping Use   Vaping status: Never Used  Substance and Sexual Activity   Alcohol use: No   Drug use: No   Sexual activity: Not Currently    Birth control/protection: Surgical    Comment: hyst  Other Topics Concern   Not on file  Social History Narrative   ** Merged History Encounter **       ** Data from: 04/28/23 Enc Dept: [facility]-PRE ADM TESTING   #  [place]ives [place]; smoking- 1/2 ppd; no alcohol; used to run machines;   FHx-Dad- MI; ? Cancer on autopsy; sisters/aunts- brain; lung cancer x2; oldest sister- kidney cancer  Pt has 33 year old son;daughter 33 years.  Brothers- states to have s   poken to her family re: importance of them being checked for lynch.        ** Data from: 05/04/23 Enc Dept: [facility]   Lives alone with 4 large dogs.    Social Drivers of Health   Financial Resource Strain: Medium Risk (12/07/2022)   Overall Financial Resource Strain (CARDIA)    Difficulty of Paying Living Expenses: Somewhat hard  Food Insecurity: Food Insecurity Present (10/16/2023)   Hunger Vital Sign    Worried About Running Out of Food in the Last Year: Sometimes true    Ran Out of Food in the Last Year: Sometimes true  Transportation Needs: Unmet Transportation Needs (10/16/2023)   PRAPARE - Administrator, Civil Service (Medical): Yes    Lack of Transportation (Non-Medical): Yes  Physical Activity: Inactive (12/07/2022)   Exercise Vital Sign    Days of Exercise per Week: 0 days    Minutes of Exercise per Session: 0 min  Stress: Stress Concern Present (12/07/2022)   [employer] of Occupational Health - Occupational Stress Questionnaire    Feeling of Stress : To some extent  Social Connections: Socially Isolated (10/16/2023)   Social Connection and Isolation Panel [NHANES]    Frequency of Communication with Friends and Family: More than three times a week    Frequency of Social  Gatherings with Friends and Family: More than three times a week    Attends Religious Services: Never    Database administrator or Organizations: No    Attends Banker Meetings: Never    Marital Status: Widowed   Past Medical History:  Past Medical History:  Diagnosis Date   Abdominal pain, acute, left upper quadrant    Abdominal pain, epigastric    Abnormal CT scan, sigmoid colon    Abnormal vaginal Pap smear    Adenocarcinoma of colon (HCC) 09/13/2015   Partial colon resection and chemo tx's.    Anemia    Anxiety    Anxiety disorder, unspecified    Aphasia    Asthma    Cancer (HCC)    endometrial; cancer cells in intestine    Chronic pain of left knee    Colon neoplasm    Constipation    Conversion reaction    COPD (chronic obstructive pulmonary disease) (HCC)    no definite diagnosis   Depression    Diverticulitis    Diverticulosis of colon without hemorrhage    Dyspareunia 05/16/2015   Dysphasia    Endometrial cancer (HCC)    Esophageal dysphagia    Family history of cancer    Family history of kidney cancer    Ganglion of left knee    Gastric intestinal metaplasia    GERD (gastroesophageal reflux disease)    Glaucoma    Hematochezia    History of colonic polyps    Hyperlipidemia    Indigestion    Lynch syndrome    Mucosal abnormality of stomach    Nausea and vomiting    Neuropathy    feet and hands   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 drug (HCC)    PONV (postoperative nausea and vomiting)    Pre-diabetes    Primary osteoarthritis of left knee    Rectal bleeding    Reflux esophagitis    Soft tissue swelling of knee joint    Uterine fibroid    UTI (urinary tract infection) 04/26/2023   on cipro   Vaginal dryness 05/16/2015   Vaginal itching    Vaginal Pap smear, abnormal     Past Surgical History:  Procedure Laterality Date   ABDOMINAL HYSTERECTOMY     APPENDECTOMY     BIOPSY N/A 03/14/2015   Procedure: BIOPSY;  Surgeon: Corbin Ade, MD;  Location: [facility];  Service: Endoscopy;  Laterality: N/A;  Gastric   COLONOSCOPY N/A 01/28/2017   Procedure: COLONOSCOPY;  Surgeon: Corbin Ade, MD;  Location[facility];  Service: Endoscopy;  Laterality: N/A;  11:30am   COLONOSCOPY WITH PROPOFOL N/A 03/14/2015   RMR: Internal hemorrhoids. colonic diverticulosis. Incomplete examination. Prepartation inadequate.   COLONOSCOPY WITH PROPOFOL N/A 07/04/2015   RMR: Colonic diverticulosis . Large polypoid lesion in the vicinity of the hepatic flexure status post saline-assisted piecmeal snare polypectomy  with ablation and tattooing as described. Sigmoid polyp removed as described above. sigmoid  colon polyp hyperplastic, hepatic flexure polyp with TA with focal high grade dysplasia    COLONOSCOPY WITH PROPOFOL N/A 11/03/2018   Procedure: COLONOSCOPY WITH PROPOFOL;  Surgeon: Wyline Mood, MD;  Location: [facility] ENDOSCOPY;  Service: Gastroenterology;  Laterality: N/A;   COLONOSCOPY WITH PROPOFOL N/A 12/09/2020   Procedure: COLONOSCOPY WITH PROPOFOL;  Surgeon: Wyline Mood, MD;  Location: [facility] ENDOSCOPY;  Service: Gastroenterology;  Laterality: N/A;  COVID POSITIVE 10/30/2020   COLONOSCOPY WITH PROPOFOL N/A 01/20/2021   Procedure: COLONOSCOPY WITH PROPOFOL;  Surgeon:  Wyline Mood, MD;  Location: [facility] ENDOSCOPY;  Service: Gastroenterology;  Laterality: N/A;   COLONOSCOPY WITH PROPOFOL N/A 12/10/2021   Procedure: COLONOSCOPY WITH PROPOFOL;  Surgeon: Wyline Mood, MD;  Location[facility] ENDOSCOPY;  Service: Gastroenterology;  Laterality: N/A;   COLONOSCOPY WITH PROPOFOL N/A 02/16/2023   Procedure: COLONOSCOPY WITH PROPOFOL;  Surgeon: Wyline Mood, MD;  Location: [facility] ENDOSCOPY;  Service: Gastroenterology;  Laterality: N/A;   ENTEROSCOPY N/A 02/12/2020   Procedure: ENTEROSCOPY;  Surgeon: Wyline Mood, MD;  Location: [facility] ENDOSCOPY;  Service: Gastroenterology;  Laterality: N/A;   ESOPHAGEAL DILATION N/A 03/14/2015   Procedure: ESOPHAGEAL DILATION;  Surgeon: Corbin Ade, MD;  Location: [facility];  Service: Endoscopy;  Laterality: N/AElease Hashimoto 54   ESOPHAGOGASTRODUODENOSCOPY N/A 05/19/2016   Procedure: ESOPHAGOGASTRODUODENOSCOPY (EGD);  Surgeon: Corbin Ade, MD;  [facility];  Service: Endoscopy;  Laterality: N/A;  215   ESOPHAGOGASTRODUODENOSCOPY N/A 01/28/2017   Procedure: ESOPHAGOGASTRODUODENOSCOPY (EGD);  Surgeon: Corbin Ade, MD;  [facility];  Service: Endoscopy;  Laterality: N/A;   ESOPHAGOGASTRODUODENOSCOPY N/A 12/10/2021   Procedure: ESOPHAGOGASTRODUODENOSCOPY (EGD);  Surgeon: Wyline Mood, MD;  Location: [facility] ENDOSCOPY;  Service: Gastroenterology;  Laterality: N/A;    ESOPHAGOGASTRODUODENOSCOPY (EGD) WITH PROPOFOL N/A 03/14/2015   RMR: Mild erosive reflux esophagitis status post passage o f a Maloney dilator. Abnormal gastric mucosa of uncertain significance as described above. status post biopsy, benign   ESOPHAGOGASTRODUODENOSCOPY (EGD) WITH PROPOFOL N/A 11/18/2017   Procedure: ESOPHAGOGASTRODUODENOSCOPY (EGD) WITH PROPOFOL;  Surgeon: Corbin Ade, MD;  [facility];  Service: Endoscopy;  Laterality: N/A;  12:15pm   ESOPHAGOGASTRODUODENOSCOPY (EGD) WITH PROPOFOL N/A 11/03/2018   Procedure: ESOPHAGOGASTRODUODENOSCOPY (EGD) WITH PROPOFOL;  Surgeon: Wyline Mood, MD;  Location: [facility] ENDOSCOPY;  Service: Gastroenterology;  Laterality: N/A;   ESOPHAGOGASTRODUODENOSCOPY (EGD) WITH PROPOFOL N/A 02/16/2023   Procedure: ESOPHAGOGASTRODUODENOSCOPY (EGD) WITH PROPOFOL;  Surgeon: Wyline Mood, MD;  Location: [facility] ENDOSCOPY;  Service: Gastroenterology;  Laterality: N/A;   GIVENS CAPSULE STUDY N/A 12/25/2019   Procedure: GIVENS CAPSULE STUDY;  Surgeon: Wyline Mood, MD;  Location: [facility] ENDOSCOPY;  Service: Gastroenterology;  Laterality: N/A;   MALONEY DILATION N/A 05/19/2016   Procedure: [name] DILATION;  Surgeon: Corbin Ade, MD;  [facility];  Service: Endoscopy;  Laterality: N/A;   MALONEY DILATION N/A 01/28/2017   Procedure: [name] DILATION;  Surgeon: Corbin Ade, MD;  [facility];  Service: Endoscopy;  Laterality: N/A;   MALONEY DILATION N/A 11/18/2017   Procedure: [name] DILATION;  Surgeon: Corbin Ade, MD;  [facility];  Service: Endoscopy;  Laterality: N/A;   PARTIAL COLECTOMY  08/30/2015   polyp with adenocarcinoma   PARTIAL COLECTOMY  08/29/2018   POLYPECTOMY N/A 07/04/2015   Procedure: POLYPECTOMY;  Surgeon: Corbin Ade, MD;  Location: [facility];  Service: Endoscopy;  Laterality: N/A;   PORTACATH PLACEMENT Right 09/21/2014   TUBAL LIGATION     Family History:  Family History  Problem Relation  Age of Onset   Other Mother        clot that went to heart, deceased age 33ss   Heart attack Father        age 79s, deceased   Stroke Father    Cancer Father        "at death determined he was ate up with cancer"   Hypertension Sister    Kidney cancer Sister 19   Diabetes Brother    Hypertension Brother    Endometriosis Daughter  Heart attack Maternal Grandfather    Diabetes Sister    Brain cancer Paternal Aunt    Cancer Paternal Uncle        NOS   Cancer Paternal Uncle        NOS   Cancer Paternal Uncle        NOS   Colon cancer Neg Hx     Hospital Course:  63 yo female patient presents to emergency department for generalized weakness and tremors. Pt state that she fell yesterday and hit her head; she reported feeling suicidal; states she wants to cut her wrist as she is tired of the stress and the way she is living. Reports feels unsafe to return home .  On admission she was continued on Lexapro 20 mg daily, trazodone 50 mg nightly.  Since her admission her BuSpar was adjusted to 10 mg twice daily to help with anxiety.  During her stay she participated in the group and milieu therapy.  Multidisciplinary treatment team met with her and worked on her treatment goals.  During the multidisciplinary treatment team patient reports various concerns about having her son at the house where he works night shift and does not like any sounds during the daytime which patient gives as a reason for not able to cook and feed herself leading up to 40 pounds weight loss.  [facility] report was made and the APS caseworker came onto the unit to meet with the patient.  They did assign a permanent caseworker through APS to check on the patient on discharge.  Throughout the stay patient did acknowledge that she wants to go back home and will eventually talk to her son to get his own place.  On the day of discharge patient denies SI/HI/plan.  She denies auditory/visual hallucinations.  Patient remains future oriented  with a caseworker coming home and helping her.       Psychiatric Specialty Exam:  Presentation  General Appearance:  Appropriate for Environment; Casual  Eye Contact: Fair  Speech: Clear and Coherent  Speech Volume: Normal    Mood and Affect  Mood: Euthymic  Affect: Appropriate   Thought Process  Thought Processes: Coherent  Descriptions of Associations:Intact  Orientation:Full (Time, Place and Person)  Thought Content:Logical  Hallucinations:Hallucinations: None  Ideas of Reference:None  Suicidal Thoughts:Suicidal Thoughts: No  Homicidal Thoughts:Homicidal Thoughts: No   Sensorium  Memory: Immediate Fair; Recent Fair; Remote Fair  Judgment: Fair  Insight: Fair   Art therapist  Concentration: Fair  Attention Span: Fair  Recall: [employer] of Knowledge: Fair  Language: Fair   Psychomotor Activity  Psychomotor Activity: Psychomotor Activity: Normal  Musculoskeletal: Strength & Muscle Tone: within normal limits Gait & Station: normal Assets  Assets: Manufacturing systems engineer; Desire for Improvement; Physical Health   Sleep  Sleep: Sleep: Fair    Physical Exam: Physical Exam Vitals and nursing note reviewed.  HENT:     Head: Normocephalic.     Nose: Nose normal.     Mouth/Throat:     Mouth: Mucous membranes are moist.  Eyes:     Pupils: Pupils are equal, round, and reactive to light.  Cardiovascular:     Rate and Rhythm: Normal rate.     Pulses: Normal pulses.  Pulmonary:     Breath sounds: Normal breath sounds.  Abdominal:     General: Bowel sounds are normal.  Skin:    General: Skin is warm.  Neurological:     General: No focal deficit present.  Mental Status: She is alert.    Review of Systems  Constitutional: Negative.   HENT: Negative.    Eyes: Negative.   Respiratory: Negative.    Cardiovascular: Negative.   Gastrointestinal: Negative.   Musculoskeletal: Negative.   Skin: Negative.    Neurological: Negative.    Blood pressure (!) 124/52, pulse 71, temperature 97.9 F (36.6 C), resp. rate 18, height 5\' 3"  (1.6 m), weight 46 kg, SpO2 100%. Body mass index is 17.98 kg/m.   Social History   Tobacco Use  Smoking Status Never  Smokeless Tobacco Never   Tobacco Cessation:  A prescription for an FDA-approved tobacco cessation medication provided at discharge   Blood Alcohol level:  Lab Results  Component Value Date   [facility] <10 10/15/2023   ETH <10 09/29/2023    Metabolic Disorder Labs:  Lab Results  Component Value Date   HGBA1C 5.9 (H) 05/03/2023   MPG 123 05/03/2023   No results found for: "PROLACTIN" Lab Results  Component Value Date   CHOL 149 05/03/2023   TRIG 66 05/03/2023   HDL 49 05/03/2023   CHOLHDL 3.0 05/03/2023   VLDL 13 05/03/2023   LDLCALC 87 05/03/2023    See Psychiatric Specialty Exam and Suicide Risk Assessment completed by Attending Physician prior to discharge.  Discharge destination:  Home  Is patient on multiple antipsychotic therapies at discharge:  No   Has Patient had three or more failed trials of antipsychotic monotherapy by history:  No  Recommended Plan for Multiple Antipsychotic Therapies: NA  Discharge Instructions     Diet - low sodium heart healthy   Complete by: As directed    Diet - low sodium heart healthy   Complete by: As directed    Increase activity slowly   Complete by: As directed    Increase activity slowly   Complete by: As directed         Follow-up Information     [facility] Follow up on 10/28/2023.   Why: Your appointment is scheduled for February 6th at 1:30 PM. Please bring your new patient information packet                Follow-up recommendations:  Activity:  as tolerated    Signed: Verner Chol, MD 10/22/2023, 1:08 PM

## 2023-10-22 NOTE — Care Management Important Message (Signed)
Important Message  Patient Details  Name: Julia Osborne MRN: [id number] Date of Birth: 1961/06/03   Important Message Given:  Yes - Medicare IM     Elza Rafter, LCSWA 10/22/2023, 12:32 PM
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
## 2023-10-28 ENCOUNTER — Ambulatory Visit (INDEPENDENT_AMBULATORY_CARE_PROVIDER_SITE_OTHER): Payer: Medicare HMO | Admitting: Psychiatry

## 2023-10-28 ENCOUNTER — Encounter: Payer: Self-pay | Admitting: Psychiatry

## 2023-10-28 VITALS — BP 138/82 | HR 68 | Temp 98.6°F | Ht 63.0 in | Wt 108.8 lb

## 2023-10-28 DIAGNOSIS — G4700 Insomnia, unspecified: Secondary | ICD-10-CM

## 2023-10-28 DIAGNOSIS — F411 Generalized anxiety disorder: Secondary | ICD-10-CM | POA: Diagnosis not present

## 2023-10-28 DIAGNOSIS — F330 Major depressive disorder, recurrent, mild: Secondary | ICD-10-CM | POA: Diagnosis not present

## 2023-10-28 DIAGNOSIS — F4310 Post-traumatic stress disorder, unspecified: Secondary | ICD-10-CM

## 2023-10-28 NOTE — Patient Instructions (Signed)
 Continue Lexapro  20 mg daily Continue buspar  10 mg twice a day  Continue hydroxyzine  25 mg twice a day for anxiety Continue trazodone  50 mg at night as needed for insomnia Referral for therapy Next appointment- 4/7 at 1 pm

## 2023-10-29 DIAGNOSIS — R112 Nausea with vomiting, unspecified: Secondary | ICD-10-CM | POA: Diagnosis not present

## 2023-10-29 DIAGNOSIS — R197 Diarrhea, unspecified: Secondary | ICD-10-CM | POA: Diagnosis not present

## 2023-10-29 DIAGNOSIS — E785 Hyperlipidemia, unspecified: Secondary | ICD-10-CM | POA: Diagnosis not present

## 2023-10-29 DIAGNOSIS — Z9181 History of falling: Secondary | ICD-10-CM | POA: Diagnosis not present

## 2023-10-29 DIAGNOSIS — E43 Unspecified severe protein-calorie malnutrition: Secondary | ICD-10-CM | POA: Diagnosis not present

## 2023-10-29 DIAGNOSIS — F419 Anxiety disorder, unspecified: Secondary | ICD-10-CM | POA: Diagnosis not present

## 2023-10-29 DIAGNOSIS — M6281 Muscle weakness (generalized): Secondary | ICD-10-CM | POA: Diagnosis not present

## 2023-10-29 DIAGNOSIS — R569 Unspecified convulsions: Secondary | ICD-10-CM | POA: Diagnosis not present

## 2023-10-29 DIAGNOSIS — I69398 Other sequelae of cerebral infarction: Secondary | ICD-10-CM | POA: Diagnosis not present

## 2023-11-01 ENCOUNTER — Other Ambulatory Visit: Payer: Self-pay

## 2023-11-01 ENCOUNTER — Encounter (HOSPITAL_COMMUNITY): Payer: Self-pay

## 2023-11-01 ENCOUNTER — Emergency Department (HOSPITAL_COMMUNITY): Payer: Medicare HMO

## 2023-11-01 ENCOUNTER — Emergency Department (HOSPITAL_COMMUNITY)
Admission: EM | Admit: 2023-11-01 | Discharge: 2023-11-01 | Disposition: A | Discharge status: Home / Self Care | Payer: Medicare HMO | Attending: Emergency Medicine | Admitting: Emergency Medicine

## 2023-11-01 DIAGNOSIS — J45909 Unspecified asthma, uncomplicated: Secondary | ICD-10-CM | POA: Insufficient documentation

## 2023-11-01 DIAGNOSIS — R001 Bradycardia, unspecified: Secondary | ICD-10-CM | POA: Diagnosis not present

## 2023-11-01 DIAGNOSIS — R110 Nausea: Secondary | ICD-10-CM

## 2023-11-01 DIAGNOSIS — Z20822 Contact with and (suspected) exposure to covid-19: Secondary | ICD-10-CM | POA: Insufficient documentation

## 2023-11-01 DIAGNOSIS — E876 Hypokalemia: Secondary | ICD-10-CM | POA: Insufficient documentation

## 2023-11-01 DIAGNOSIS — R059 Cough, unspecified: Secondary | ICD-10-CM | POA: Diagnosis not present

## 2023-11-01 DIAGNOSIS — J449 Chronic obstructive pulmonary disease, unspecified: Secondary | ICD-10-CM | POA: Insufficient documentation

## 2023-11-01 DIAGNOSIS — Z7982 Long term (current) use of aspirin: Secondary | ICD-10-CM | POA: Insufficient documentation

## 2023-11-01 DIAGNOSIS — E878 Other disorders of electrolyte and fluid balance, not elsewhere classified: Secondary | ICD-10-CM | POA: Insufficient documentation

## 2023-11-01 DIAGNOSIS — R300 Dysuria: Secondary | ICD-10-CM | POA: Diagnosis not present

## 2023-11-01 DIAGNOSIS — Z8541 Personal history of malignant neoplasm of cervix uteri: Secondary | ICD-10-CM | POA: Insufficient documentation

## 2023-11-01 DIAGNOSIS — R569 Unspecified convulsions: Secondary | ICD-10-CM | POA: Diagnosis not present

## 2023-11-01 DIAGNOSIS — I10 Essential (primary) hypertension: Secondary | ICD-10-CM | POA: Diagnosis not present

## 2023-11-01 DIAGNOSIS — R0789 Other chest pain: Secondary | ICD-10-CM | POA: Diagnosis not present

## 2023-11-01 DIAGNOSIS — R531 Weakness: Secondary | ICD-10-CM | POA: Diagnosis not present

## 2023-11-01 DIAGNOSIS — R079 Chest pain, unspecified: Secondary | ICD-10-CM | POA: Diagnosis not present

## 2023-11-01 DIAGNOSIS — Z7951 Long term (current) use of inhaled steroids: Secondary | ICD-10-CM | POA: Insufficient documentation

## 2023-11-01 DIAGNOSIS — F32A Depression, unspecified: Secondary | ICD-10-CM | POA: Diagnosis not present

## 2023-11-01 LAB — URINALYSIS, W/ REFLEX TO CULTURE (INFECTION SUSPECTED)
Bacteria, UA: NONE SEEN
Bilirubin Urine: NEGATIVE
Glucose, UA: NEGATIVE mg/dL
Hgb urine dipstick: NEGATIVE
Ketones, ur: NEGATIVE mg/dL
Leukocytes,Ua: NEGATIVE
Nitrite: NEGATIVE
Protein, ur: NEGATIVE mg/dL
Specific Gravity, Urine: 1.009 (ref 1.005–1.030)
pH: 7 (ref 5.0–8.0)

## 2023-11-01 LAB — COMPREHENSIVE METABOLIC PANEL
ALT: 15 U/L (ref 0–44)
ALT: 23 U/L (ref 0–44)
AST: 10 U/L — ABNORMAL LOW (ref 15–41)
AST: 17 U/L (ref 15–41)
Albumin: 2.3 g/dL — ABNORMAL LOW (ref 3.5–5.0)
Albumin: 4.1 g/dL (ref 3.5–5.0)
Alkaline Phosphatase: 29 U/L — ABNORMAL LOW (ref 38–126)
Alkaline Phosphatase: 49 U/L (ref 38–126)
Anion gap: 6 (ref 5–15)
Anion gap: 11 (ref 5–15)
BUN: 8 mg/dL (ref 6–20)
BUN: 12 mg/dL (ref 6–20)
CO2: 19 mmol/L — ABNORMAL LOW (ref 22–32)
CO2: 26 mmol/L (ref 22–32)
Calcium: 5.7 mg/dL — CL (ref 8.9–10.3)
Calcium: 8.8 mg/dL — ABNORMAL LOW (ref 8.9–10.3)
Chloride: 119 mmol/L — ABNORMAL HIGH (ref 98–111)
Chloride: 104 mmol/L (ref 98–111)
Creatinine, Ser: 0.37 mg/dL — ABNORMAL LOW (ref 0.44–1.00)
Creatinine, Ser: 0.79 mg/dL (ref 0.44–1.00)
GFR, Estimated: >60 mL/min (ref >60)
GFR, Estimated: >60 mL/min (ref >60)
Glucose, Bld: 61 mg/dL — ABNORMAL LOW (ref 70–99)
Glucose, Bld: 83 mg/dL (ref 70–99)
Potassium: 2.1 mmol/L — CL (ref 3.5–5.1)
Potassium: 3.2 mmol/L — ABNORMAL LOW (ref 3.5–5.1)
Sodium: 144 mmol/L (ref 135–145)
Sodium: 141 mmol/L (ref 135–145)
Total Bilirubin: 0.7 mg/dL (ref 0.0–1.2)
Total Bilirubin: 0.8 mg/dL (ref 0.0–1.2)
Total Protein: 3.9 g/dL — ABNORMAL LOW (ref 6.5–8.1)
Total Protein: 6.8 g/dL (ref 6.5–8.1)

## 2023-11-01 LAB — RESP PANEL BY RT-PCR (RSV, FLU A&B, COVID)  RVPGX2
Influenza A by PCR: NEGATIVE
Influenza B by PCR: NEGATIVE
Resp Syncytial Virus by PCR: NEGATIVE
SARS Coronavirus 2 by RT PCR: NEGATIVE

## 2023-11-01 LAB — CBC
HCT: 29.7 % — ABNORMAL LOW (ref 36.0–46.0)
HCT: 34.4 % — ABNORMAL LOW (ref 36.0–46.0)
Hemoglobin: 9.6 g/dL — ABNORMAL LOW (ref 12.0–15.0)
Hemoglobin: 11.2 g/dL — ABNORMAL LOW (ref 12.0–15.0)
MCH: 30.3 pg (ref 26.0–34.0)
MCH: 30.3 pg (ref 26.0–34.0)
MCHC: 32.3 g/dL (ref 30.0–36.0)
MCHC: 32.6 g/dL (ref 30.0–36.0)
MCV: 93.7 fL (ref 80.0–100.0)
MCV: 93 fL (ref 80.0–100.0)
Platelets: 216 10*3/uL (ref 150–400)
Platelets: 254 10*3/uL (ref 150–400)
RBC: 3.17 MIL/uL — ABNORMAL LOW (ref 3.87–5.11)
RBC: 3.7 MIL/uL — ABNORMAL LOW (ref 3.87–5.11)
RDW: 12.6 % (ref 11.5–15.5)
RDW: 12.6 % (ref 11.5–15.5)
WBC: 5.7 10*3/uL (ref 4.0–10.5)
WBC: 6.8 10*3/uL (ref 4.0–10.5)
nRBC: 0 % (ref 0.0–0.2)
nRBC: 0 % (ref 0.0–0.2)

## 2023-11-01 MED ORDER — ONDANSETRON 4 MG PO TBDP: ORAL_TABLET | ORAL | 0 refills | Status: DC

## 2023-11-01 MED ORDER — ONDANSETRON HCL 4 MG/2ML IJ SOLN
4.0000 mg | Freq: Once | INTRAMUSCULAR | Status: AC
  Administered 2023-11-01: 4 mg via INTRAVENOUS
  Filled 2023-11-01: qty 2

## 2023-11-01 MED ORDER — POTASSIUM CHLORIDE CRYS ER 20 MEQ PO TBCR
20.0000 meq | EXTENDED_RELEASE_TABLET | Freq: Once | ORAL | Status: AC
  Administered 2023-11-01: 20 meq via ORAL
  Filled 2023-11-01: qty 1

## 2023-11-01 NOTE — ED Provider Notes (Signed)
 [place] EMERGENCY DEPARTMENT AT [facility] Provider Note   CSN: [id number] Arrival date & time: 11/01/23  1241     History  Chief Complaint  Patient presents with   Seizures    Julia Osborne is a 63 y.o. female.   Seizures Patient presents with various complaints.  Reported seizure-like activity and chest pain.  Recent inpatient psychiatric admission.  Reportedly has had chest pain.  Also states pain in her legs.  Pain in her abdomen.  States she has not been able to eat and drink because she just cannot.  Also reportedly had some shaking at times.  Happens when she becomes agitated.  Has had previous nonepileptic seizure events.  Denies suicidal or homicidal thoughts but somewhat difficult to get history from.    Past Medical History:  Diagnosis Date   Abdominal pain, acute, left upper quadrant    Abdominal pain, epigastric    Abnormal CT scan, sigmoid colon    Abnormal vaginal Pap smear    Adenocarcinoma of colon (HCC) 09/13/2015   Partial colon resection and chemo tx's.    Anemia    Anxiety    Anxiety disorder, unspecified    Aphasia    Asthma    Cancer (HCC)    endometrial; cancer cells in intestine   Chronic pain of left knee    Colon neoplasm    Constipation    Conversion reaction    COPD (chronic obstructive pulmonary disease) (HCC)    no definite diagnosis   Depression    Diverticulitis    Diverticulosis of colon without hemorrhage    Dyspareunia 05/16/2015   Dysphasia    Endometrial cancer (HCC)    Esophageal dysphagia    Family history of cancer    Family history of kidney cancer    Ganglion of left knee    Gastric intestinal metaplasia    GERD (gastroesophageal reflux disease)    Glaucoma    Hematochezia    History of colonic polyps    Hyperlipidemia    Indigestion    Lynch syndrome    Mucosal abnormality of stomach    Nausea and vomiting    Neuropathy    feet and hands   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 drug (HCC)    PONV  (postoperative nausea and vomiting)    Pre-diabetes    Primary osteoarthritis of left knee    Rectal bleeding    Reflux esophagitis    Soft tissue swelling of knee joint    Uterine fibroid    UTI (urinary tract infection) 04/26/2023   on cipro    Vaginal dryness 05/16/2015   Vaginal itching    Vaginal Pap smear, abnormal     Home Medications Prior to Admission medications   Medication Sig Start Date End Date Taking? Authorizing Provider  albuterol  (PROVENTIL  HFA;VENTOLIN  HFA) 108 (90 Base) MCG/ACT inhaler Inhale 2 puffs into the lungs every 2 (two) hours as needed for wheezing or shortness of breath (cough). 10/17/15   Delman Ferns, NP  albuterol  (PROVENTIL ) (2.5 MG/3ML) 0.083% nebulizer solution Take 3 mLs (2.5 mg total) by nebulization every 6 (six) hours as needed for wheezing or shortness of breath. 01/16/16   Penland, Marge Shed, MD  aspirin  EC 81 MG tablet Take 1 tablet (81 mg total) by mouth daily. Swallow whole. 10/23/23   Aurelia Blotter, MD  atorvastatin  (LIPITOR) 40 MG tablet Take 1 tablet (40 mg total) by mouth daily. 05/10/23 09/09/24  Silas Drivers, MD  brimonidine  (ALPHAGAN ) 0.2 %  ophthalmic solution Place 1 drop into the left eye at bedtime. 10/22/23   Aurelia Blotter, MD  brinzolamide  (AZOPT ) 1 % ophthalmic suspension Place 1 drop into the left eye at bedtime. 10/22/23   Aurelia Blotter, MD  busPIRone  (BUSPAR ) 10 MG tablet Take 1 tablet (10 mg total) by mouth 2 (two) times daily. 10/22/23   Aurelia Blotter, MD  clopidogrel  (PLAVIX ) 75 MG tablet Take 1 tablet (75 mg total) by mouth daily. 10/23/23   Aurelia Blotter, MD  docusate sodium  (COLACE) 100 MG capsule Take 1 capsule (100 mg total) by mouth 2 (two) times daily. Patient not taking: Reported on 10/28/2023 10/22/23   Jadapalle, Sree, MD  escitalopram  (LEXAPRO ) 20 MG tablet Take 1 tablet (20 mg total) by mouth at bedtime. 10/22/23   Jadapalle, Sree, MD  feeding supplement (ENSURE ENLIVE / ENSURE PLUS) LIQD Take 237 mLs by mouth 2  (two) times daily between meals. Patient not taking: Reported on 10/28/2023 10/22/23   Jadapalle, Sree, MD  ferrous sulfate  325 (65 FE) MG tablet Take 1 tablet (325 mg total) by mouth every other day. 10/23/23   Aurelia Blotter, MD  fluticasone  (FLONASE ) 50 MCG/ACT nasal spray Place 1 spray into both nostrils daily. 12/06/22 12/06/23  Buell Carmin, MD  gabapentin  (NEURONTIN ) 100 MG capsule Take 1 capsule (100 mg total) by mouth daily. 10/23/23   Jadapalle, Sree, MD  gabapentin  (NEURONTIN ) 100 MG capsule Take 2 capsules (200 mg total) by mouth at bedtime. 10/22/23   Jadapalle, Sree, MD  latanoprost  (XALATAN ) 0.005 % ophthalmic solution Place 1 drop into the left eye at bedtime. 10/22/23   Jadapalle, Sree, MD  loratadine (CLARITIN) 10 MG tablet Take 10 mg by mouth daily.    [provider]  meclizine  (ANTIVERT ) 25 MG tablet Take 1 tablet (25 mg total) by mouth 3 (three) times daily as needed for dizziness. Patient not taking: Reported on 10/28/2023 12/06/22   Buell Carmin, MD  montelukast  (SINGULAIR ) 10 MG tablet Take 1 tablet (10 mg total) by mouth daily. 05/10/23   Silas Drivers, MD  pantoprazole  (PROTONIX ) 40 MG tablet Take 1 tablet (40 mg total) by mouth daily before breakfast. 05/11/23   Silas Drivers, MD  Plecanatide  (TRULANCE ) 3 MG TABS Take 1 tablet by mouth daily. 11/20/21   Luke Salaam, MD  senna-docusate (SENOKOT-S) 8.6-50 MG tablet Take 2 tablets by mouth 2 (two) times daily. 05/10/23   Silas Drivers, MD  sodium chloride  (OCEAN) 0.65 % nasal spray Place into the nose.    [provider]  traZODone  (DESYREL ) 50 MG tablet Take 1 tablet (50 mg total) by mouth at bedtime. 10/22/23   Jadapalle, Sree, MD      Allergies    Codeine    Review of Systems   Review of Systems  Neurological:  Positive for seizures.    Physical Exam Updated Vital Signs BP 137/70   Pulse (!) 57   Temp 98.2 F (36.8 C) (Oral)   Resp 16   Ht 5\' 3"  (1.6 m)   Wt 47.2 kg   SpO2 98%   BMI 18.43 kg/m   Physical Exam Vitals and nursing note reviewed.  Cardiovascular:     Rate and Rhythm: Regular rhythm.  Pulmonary:     Breath sounds: No wheezing.  Abdominal:     Tenderness: There is no abdominal tenderness.  Skin:    General: Skin is warm.  Neurological:     Mental Status: She is alert.     Comments: Awake and answers  questions.  However mildly difficult to get history from.  Moving all extremities.     ED Results / Procedures / Treatments   Labs (all labs ordered are listed, but only abnormal results are displayed) Labs Reviewed  COMPREHENSIVE METABOLIC PANEL - Abnormal; Notable for the following components:      Result Value   Potassium 2.1 (*)    Chloride 119 (*)    CO2 19 (*)    Glucose, Bld 61 (*)    Creatinine, Ser 0.37 (*)    Calcium  5.7 (*)    Total Protein 3.9 (*)    Albumin 2.3 (*)    AST 10 (*)    Alkaline Phosphatase 29 (*)    All other components within normal limits  CBC - Abnormal; Notable for the following components:   RBC 3.17 (*)    Hemoglobin 9.6 (*)    HCT 29.7 (*)    All other components within normal limits  RESP PANEL BY RT-PCR (RSV, FLU A&B, COVID)  RVPGX2  URINALYSIS, W/ REFLEX TO CULTURE (INFECTION SUSPECTED)  CBC  COMPREHENSIVE METABOLIC PANEL    EKG None  Radiology No results found.  Procedures Procedures    Medications Ordered in ED Medications - No data to display  ED Course/ Medical Decision Making/ A&P                                 Medical Decision Making Amount and/or Complexity of Data Reviewed Labs: ordered. Radiology: ordered.   Patient with various complaints.  Chest pain abdominal pain anxiety.  Decreased appetite.  Reported nausea and vomiting.  Differential diagnosis long but includes causes such as dehydration, anxiety, viral syndrome.  States her son has had the flu.  Will get basic blood work and flu and COVID testing.  Will get EKG.  Doubt seizure activity.   Patient's hemoglobin is low.  States  that she has had nosebleeds over the last 3 days.  Not currently bleeding.  However CMP is also abnormal.  Potassium low chloride high bicarb low low glucose low creatinine very little calcium .  Proteins also gone down to 3.9 from 6.72 weeks ago.  Discussed with nursing and this was drawn off the line.  I think may be dilute.  Will recheck with a new draw to evaluate.  Care turned over to Dr. Bryna Car        Final Clinical Impression(s) / ED Diagnoses Final diagnoses:  None    Rx / DC Orders ED Discharge Orders     None         Mozell Arias, MD 11/01/23 1435

## 2023-11-01 NOTE — Discharge Instructions (Signed)
 Increase your Protonix  so you are taking it twice a day for the next 3 to 4 days.  Take Tylenol  as needed for aches and pains.  Try to drink a lot of fluids.  You have been prescribed nausea medicine.  Follow-up with your family doctor in the next 2 to 3 days if not improving

## 2023-11-01 NOTE — ED Triage Notes (Addendum)
 Patient arrived via [facility] with reports of seizure like activity and chest pain. Per EMS report patient was d/c from [facility] 1 week ago, reports seizure like tremors as a result of anxiety/depression. Patient reported right sided chest pain when EMS arrived on scene, 324mg  aspirin  and 0.4mg  nitroglycerin given at scene with relief of chest pain. EKG showed sinus rhythm heart rate 60.   Patient alert and oriented, complains of feeling like she is falling, headache, not eating or sleeping for a few days, right foot burning, and painful urination. Patient states seizures are involuntary movements that happen when she gets overwhelmed, does not appear to be post ictal.

## 2023-11-01 NOTE — ED Provider Notes (Signed)
 Patient with myalgia and nausea.  She does have hypokalemia.  Patient is given some potassium here and will be sent home with Zofran  and and told to take Tylenol  and follow-up with her PCP in 2 to 3 days if not improving   Cheyenne Cotta, MD 11/01/23 1554

## 2023-11-03 ENCOUNTER — Other Ambulatory Visit: Payer: Self-pay

## 2023-11-03 ENCOUNTER — Encounter: Payer: Self-pay | Admitting: Psychiatry

## 2023-11-03 ENCOUNTER — Encounter: Payer: Self-pay | Admitting: Emergency Medicine

## 2023-11-03 ENCOUNTER — Emergency Department
Admission: EM | Admit: 2023-11-03 | Discharge: 2023-11-03 | Disposition: A | Discharge status: Other Acute Inpatient Hospital | Payer: Medicare HMO | Attending: Emergency Medicine | Admitting: Emergency Medicine

## 2023-11-03 ENCOUNTER — Inpatient Hospital Stay
Admission: AD | Admit: 2023-11-03 | Discharge: 2023-11-13 | DRG: 885 | Disposition: A | Discharge status: Home / Self Care | Payer: Medicare HMO | Source: Intra-hospital | Attending: Psychiatry | Admitting: Psychiatry

## 2023-11-03 DIAGNOSIS — Z5941 Food insecurity: Secondary | ICD-10-CM

## 2023-11-03 DIAGNOSIS — Z818 Family history of other mental and behavioral disorders: Secondary | ICD-10-CM

## 2023-11-03 DIAGNOSIS — Z8541 Personal history of malignant neoplasm of cervix uteri: Secondary | ICD-10-CM | POA: Diagnosis not present

## 2023-11-03 DIAGNOSIS — J4489 Other specified chronic obstructive pulmonary disease: Secondary | ICD-10-CM | POA: Diagnosis not present

## 2023-11-03 DIAGNOSIS — Z7902 Long term (current) use of antithrombotics/antiplatelets: Secondary | ICD-10-CM | POA: Diagnosis not present

## 2023-11-03 DIAGNOSIS — F29 Unspecified psychosis not due to a substance or known physiological condition: Secondary | ICD-10-CM | POA: Diagnosis not present

## 2023-11-03 DIAGNOSIS — F332 Major depressive disorder, recurrent severe without psychotic features: Secondary | ICD-10-CM | POA: Diagnosis not present

## 2023-11-03 DIAGNOSIS — T451X5S Adverse effect of antineoplastic and immunosuppressive drugs, sequela: Secondary | ICD-10-CM

## 2023-11-03 DIAGNOSIS — J449 Chronic obstructive pulmonary disease, unspecified: Secondary | ICD-10-CM | POA: Insufficient documentation

## 2023-11-03 DIAGNOSIS — H409 Unspecified glaucoma: Secondary | ICD-10-CM | POA: Diagnosis not present

## 2023-11-03 DIAGNOSIS — G620 Drug-induced polyneuropathy: Secondary | ICD-10-CM | POA: Diagnosis present

## 2023-11-03 DIAGNOSIS — F410 Panic disorder [episodic paroxysmal anxiety] without agoraphobia: Secondary | ICD-10-CM | POA: Diagnosis present

## 2023-11-03 DIAGNOSIS — E785 Hyperlipidemia, unspecified: Secondary | ICD-10-CM | POA: Diagnosis present

## 2023-11-03 DIAGNOSIS — F32A Depression, unspecified: Secondary | ICD-10-CM | POA: Diagnosis not present

## 2023-11-03 DIAGNOSIS — R45851 Suicidal ideations: Secondary | ICD-10-CM | POA: Insufficient documentation

## 2023-11-03 DIAGNOSIS — Z9152 Personal history of nonsuicidal self-harm: Secondary | ICD-10-CM | POA: Diagnosis not present

## 2023-11-03 DIAGNOSIS — R410 Disorientation, unspecified: Secondary | ICD-10-CM | POA: Diagnosis not present

## 2023-11-03 DIAGNOSIS — F411 Generalized anxiety disorder: Secondary | ICD-10-CM | POA: Insufficient documentation

## 2023-11-03 DIAGNOSIS — Z823 Family history of stroke: Secondary | ICD-10-CM | POA: Diagnosis not present

## 2023-11-03 DIAGNOSIS — Z7982 Long term (current) use of aspirin: Secondary | ICD-10-CM

## 2023-11-03 DIAGNOSIS — Z85038 Personal history of other malignant neoplasm of large intestine: Secondary | ICD-10-CM | POA: Diagnosis not present

## 2023-11-03 DIAGNOSIS — Z808 Family history of malignant neoplasm of other organs or systems: Secondary | ICD-10-CM | POA: Diagnosis not present

## 2023-11-03 DIAGNOSIS — Z9151 Personal history of suicidal behavior: Secondary | ICD-10-CM

## 2023-11-03 DIAGNOSIS — Z801 Family history of malignant neoplasm of trachea, bronchus and lung: Secondary | ICD-10-CM

## 2023-11-03 DIAGNOSIS — R9431 Abnormal electrocardiogram [ECG] [EKG]: Secondary | ICD-10-CM | POA: Diagnosis not present

## 2023-11-03 DIAGNOSIS — Z8249 Family history of ischemic heart disease and other diseases of the circulatory system: Secondary | ICD-10-CM | POA: Diagnosis not present

## 2023-11-03 DIAGNOSIS — Z5982 Transportation insecurity: Secondary | ICD-10-CM | POA: Diagnosis not present

## 2023-11-03 DIAGNOSIS — G4700 Insomnia, unspecified: Secondary | ICD-10-CM | POA: Diagnosis not present

## 2023-11-03 DIAGNOSIS — Z8051 Family history of malignant neoplasm of kidney: Secondary | ICD-10-CM | POA: Diagnosis not present

## 2023-11-03 DIAGNOSIS — Z9049 Acquired absence of other specified parts of digestive tract: Secondary | ICD-10-CM

## 2023-11-03 DIAGNOSIS — F4310 Post-traumatic stress disorder, unspecified: Secondary | ICD-10-CM | POA: Insufficient documentation

## 2023-11-03 DIAGNOSIS — F17210 Nicotine dependence, cigarettes, uncomplicated: Secondary | ICD-10-CM | POA: Diagnosis not present

## 2023-11-03 DIAGNOSIS — Z79899 Other long term (current) drug therapy: Secondary | ICD-10-CM | POA: Insufficient documentation

## 2023-11-03 DIAGNOSIS — Z8542 Personal history of malignant neoplasm of other parts of uterus: Secondary | ICD-10-CM

## 2023-11-03 DIAGNOSIS — Z885 Allergy status to narcotic agent status: Secondary | ICD-10-CM

## 2023-11-03 DIAGNOSIS — Z5986 Financial insecurity: Secondary | ICD-10-CM

## 2023-11-03 DIAGNOSIS — Z833 Family history of diabetes mellitus: Secondary | ICD-10-CM

## 2023-11-03 DIAGNOSIS — I10 Essential (primary) hypertension: Secondary | ICD-10-CM | POA: Diagnosis not present

## 2023-11-03 LAB — CBC
HCT: 36.6 % (ref 36.0–46.0)
Hemoglobin: 12.4 g/dL (ref 12.0–15.0)
MCH: 31.6 pg (ref 26.0–34.0)
MCHC: 33.9 g/dL (ref 30.0–36.0)
MCV: 93.1 fL (ref 80.0–100.0)
Platelets: 262 10*3/uL (ref 150–400)
RBC: 3.93 MIL/uL (ref 3.87–5.11)
RDW: 12.5 % (ref 11.5–15.5)
WBC: 6.4 10*3/uL (ref 4.0–10.5)
nRBC: 0 % (ref 0.0–0.2)

## 2023-11-03 LAB — COMPREHENSIVE METABOLIC PANEL
ALT: 23 U/L (ref 0–44)
AST: 19 U/L (ref 15–41)
Albumin: 4.5 g/dL (ref 3.5–5.0)
Alkaline Phosphatase: 47 U/L (ref 38–126)
Anion gap: 10 (ref 5–15)
BUN: 12 mg/dL (ref 6–20)
CO2: 27 mmol/L (ref 22–32)
Calcium: 9.3 mg/dL (ref 8.9–10.3)
Chloride: 105 mmol/L (ref 98–111)
Creatinine, Ser: 0.72 mg/dL (ref 0.44–1.00)
GFR, Estimated: >60 mL/min (ref >60)
Glucose, Bld: 94 mg/dL (ref 70–99)
Potassium: 3.8 mmol/L (ref 3.5–5.1)
Sodium: 142 mmol/L (ref 135–145)
Total Bilirubin: 1 mg/dL (ref 0.0–1.2)
Total Protein: 7.3 g/dL (ref 6.5–8.1)

## 2023-11-03 LAB — URINE DRUG SCREEN, QUALITATIVE (ARMC ONLY)
Amphetamines, Ur Screen: NOT DETECTED
Barbiturates, Ur Screen: NOT DETECTED
Benzodiazepine, Ur Scrn: NOT DETECTED
Cannabinoid 50 Ng, Ur ~~LOC~~: NOT DETECTED
Cocaine Metabolite,Ur ~~LOC~~: NOT DETECTED
MDMA (Ecstasy)Ur Screen: NOT DETECTED
Methadone Scn, Ur: NOT DETECTED
Opiate, Ur Screen: NOT DETECTED
Phencyclidine (PCP) Ur S: NOT DETECTED
Tricyclic, Ur Screen: NOT DETECTED

## 2023-11-03 LAB — SALICYLATE LEVEL: Salicylate Lvl: <7 mg/dL — ABNORMAL LOW (ref 7.0–30.0)

## 2023-11-03 LAB — SARS CORONAVIRUS 2 BY RT PCR: SARS Coronavirus 2 by RT PCR: NEGATIVE

## 2023-11-03 LAB — ACETAMINOPHEN LEVEL: Acetaminophen (Tylenol), Serum: <10 ug/mL — ABNORMAL LOW (ref 10–30)

## 2023-11-03 LAB — ETHANOL: Alcohol, Ethyl (B): <10 mg/dL (ref <10)

## 2023-11-03 MED ORDER — MAGNESIUM HYDROXIDE 400 MG/5ML PO SUSP: 30.0000 mL | Freq: Every day | ORAL | Status: DC | PRN

## 2023-11-03 MED ORDER — NICOTINE 14 MG/24HR TD PT24
14.0000 mg | MEDICATED_PATCH | Freq: Every day | TRANSDERMAL | Status: DC
  Administered 2023-11-03 – 2023-11-13 (×11): 14 mg via TRANSDERMAL
  Filled 2023-11-03 (×11): qty 1

## 2023-11-03 MED ORDER — ACETAMINOPHEN 325 MG PO TABS
650.0000 mg | ORAL_TABLET | Freq: Four times a day (QID) | ORAL | Status: DC | PRN
  Administered 2023-11-03 – 2023-11-09 (×7): 650 mg via ORAL
  Filled 2023-11-03 (×8): qty 2

## 2023-11-03 MED ORDER — ESCITALOPRAM OXALATE 10 MG PO TABS: 20.0000 mg | ORAL_TABLET | Freq: Every day | ORAL | Status: DC

## 2023-11-03 MED ORDER — ALUM & MAG HYDROXIDE-SIMETH 200-200-20 MG/5ML PO SUSP: 30.0000 mL | ORAL | Status: DC | PRN

## 2023-11-03 MED ORDER — HYDROXYZINE HCL 25 MG PO TABS
25.0000 mg | ORAL_TABLET | Freq: Three times a day (TID) | ORAL | Status: DC | PRN
  Administered 2023-11-03 – 2023-11-12 (×7): 25 mg via ORAL
  Filled 2023-11-03 (×7): qty 1

## 2023-11-03 MED ORDER — TRAZODONE HCL 50 MG PO TABS: 50.0000 mg | ORAL_TABLET | Freq: Every day | ORAL | Status: DC

## 2023-11-03 MED ORDER — LORAZEPAM 1 MG PO TABS
1.0000 mg | ORAL_TABLET | Freq: Once | ORAL | Status: AC
  Administered 2023-11-03: 1 mg via ORAL
  Filled 2023-11-03: qty 1

## 2023-11-03 MED ORDER — ACETAMINOPHEN 325 MG PO TABS
650.0000 mg | ORAL_TABLET | Freq: Once | ORAL | Status: AC
  Administered 2023-11-03: 650 mg via ORAL
  Filled 2023-11-03: qty 2

## 2023-11-03 MED ORDER — GABAPENTIN 100 MG PO CAPS: 200.0000 mg | ORAL_CAPSULE | Freq: Every day | ORAL | Status: DC

## 2023-11-03 MED ORDER — OLANZAPINE 10 MG IM SOLR: 5.0000 mg | Freq: Three times a day (TID) | INTRAMUSCULAR | Status: DC | PRN

## 2023-11-03 MED ORDER — BUSPIRONE HCL 5 MG PO TABS: 10.0000 mg | ORAL_TABLET | Freq: Two times a day (BID) | ORAL | Status: DC

## 2023-11-03 MED ORDER — OLANZAPINE 5 MG PO TBDP: 5.0000 mg | ORAL_TABLET | Freq: Three times a day (TID) | ORAL | Status: DC | PRN

## 2023-11-03 NOTE — ED Notes (Signed)
This tech and NT Alexia dressed Pt out, belongings include: 1 pair Puma sneakers 1 pair white socks 1 pair gray sweatpants 1 pair blue mesh underwear 1 red long sleeve shirt 1 pink hair barrett 1 tan bra 1 white leather purse 1 pink puffy coat
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
## 2023-11-03 NOTE — Consult Note (Signed)
[facility] Psychiatric Consult Initial  Patient Name: .Julia Osborne  MRN: [id number]  DOB: [birth year]  Consult Order details:  Orders (From admission, onward)     Start     Ordered   11/03/23 1408  IP CONSULT TO PSYCHIATRY       Ordering Provider: Chesley Noon, MD  Provider:  (Not yet assigned)  Question Answer Comment  Place call to: [phone]   Reason for Consult Admit      11/03/23 1407   11/03/23 1408  CONSULT TO CALL ACT TEAM       Ordering Provider: Chesley Noon, MD  Provider:  (Not yet assigned)  Question:  Reason for Consult?  Answer:  SI   11/03/23 1407             Mode of Visit: In person, I spent 45 minutes on this consult    Psychiatry Consult Evaluation  Service Date: November 03, 2023 LOS:  LOS: 0 days  Chief Complaint "I just do can take it anymore, I can take care of the house with them dogs and my son"  Primary Psychiatric Diagnoses  Suicidal ideation 2.  Major depressive disorder, recurrent, severe 3.  PTSD 4.  Generalized Anxiety disorder  Assessment  Julia Osborne is a 62 y.o. female admitted: Presented to the EDfor 11/03/2023  1:44 PM for suicidal attempt. She carries the psychiatric diagnoses of suicidal ideation, major depressive disorder, PTSD, GAD and has a past medical history of COPD, GERD, colon neoplasm, endometrial cancer, diverticulitis, Lynch syndrome.   Her current presentation of suicidal attempt is most consistent with EMS report in which she took her trazodone and meclizine to try to go to sleep and not wake back up. She meets criteria for suicidal ideation based on EMS report of attempt, patient past medical history, and recent psychiatric hospitalization.  Current outpatient psychotropic medications include buspirone, Lexapro, gabapentin, trazodone.  And historically she has had a positive response to these medications. She was not compliant with medications prior to admission as evidenced by family report. On initial  examination, patient is anxious in bed and depressed. Please see plan below for detailed recommendations.   Diagnoses:  Active Hospital problems: Active Problems:   Depression with suicidal ideation   GAD (generalized anxiety disorder)   PTSD (post-traumatic stress disorder)   Major depressive disorder, recurrent episode, severe (HCC)    Plan   ## Psychiatric Medication Recommendations:  -Continue home psych medications  ## Medical Decision Making Capacity: Not specifically addressed in this encounter  ## Further Work-up:  -- No further workup recommended  -- most recent EKG on 11/03/2023 had QtC of 452 -- Pertinent labwork reviewed earlier this admission includes: CBC, CMP, LFTs, glucose, EKG, UDS.   ## Disposition:-- We recommend inpatient psychiatric hospitalization when medically cleared. Patient is under voluntary admission status at this time; please IVC if attempts to leave hospital.  ## Behavioral / Environmental: -Utilize compassion and acknowledge the patient's experiences while setting clear and realistic expectations for care.    ## Safety and Observation Level:  - Based on my clinical evaluation, I estimate the patient to be at high risk of self harm in the current setting. - At this time, we recommend  routine. This decision is based on my review of the chart including patient's history and current presentation, interview of the patient, mental status examination, and consideration of suicide risk including evaluating suicidal ideation, plan, intent, suicidal or self-harm behaviors, risk factors, and protective factors. This judgment  is based on our ability to directly address suicide risk, implement suicide prevention strategies, and develop a safety plan while the patient is in the clinical setting. Please contact our team if there is a concern that risk level has changed.  CSSR Risk Category:C-SSRS RISK CATEGORY: High Risk  Suicide Risk Assessment: Patient has  following modifiable risk factors for suicide: active suicidal ideation and recklessness, which we are addressing by recommend for inpatient admission. Patient has following non-modifiable or demographic risk factors for suicide: early widowhood, history of self harm behavior, and psychiatric hospitalization Patient has the following protective factors against suicide: Supportive family  Thank you for this consult request. Recommendations have been communicated to the primary team.  We will recommend for inpatient psych at this time.   Juliann Pares, NP       History of Present Illness  Relevant Aspects of Hospital ED Course:  Admitted on 11/03/2023 for suicidal attempt. They currently depressed and anxious but participating in interview.   Patient Report:  63 year old female coming to the emergency department at [facility] for suicidal attempt by taking trazodone and meclizine of unknown amounts.  EMS states that they were called out for "shaking ".  On interview patient states that she is unable to think straight, she is hungry, experiencing night terrors and feeling overwhelmed at home.  When asked to clarify why she made the suicide attempt she states for the last 2 days she has been feeling overwhelmed by having to take care of the house while trying to make sure that house was quiet for her son who works third shift.  Patient was recently seen here at [facility] in the [facility], from January 24 to October 22, 2023 with previous complaint of SI with plan.  Patient then followed up with [facility] on 10/28/2023 and establish care with Dr. Vanetta Shawl.  Based on chart review patient continues to suffer from major depressive disorder as well as generalized anxiety disorder, with medication adjustments provided by Dr. Vanetta Shawl.  Based on family report of collateral the son states that the mother has not been taking her medications appropriately and that he can tell that she is  not all herself because she does not take the medication consistently he is also questioning cognitive concerns.  At this time she does endorse SI with a plan to overdose on medications, denies HI, AVH.  This patient has significant medical history with hypertension, COPD, chronic pain, Lynch syndrome, endometrial cancer in remission, chemotherapy.  Patient lives at home alone with her son with 7 dogs which she states is overwhelming her as well as the son endorsing.  She currently endorses financial stressors as well as emotional stressors at home which led her to suicide attempt.  Due to the patient making a suicide attempt, and with considerable psychiatric history with major depressive disorder and general anxiety disorder while noncompliant on medications, it is recommended for the patient to be admitted inpatient psych for medication management.    Psych ROS:  Depression: Endorses Anxiety: Endorses Mania (lifetime and current): Denies Psychosis: (lifetime and current): Denies  Collateral information:  Contacted Aundra Millet at [phone] on 11/03/2023. Lucy Antigua the son reports that the patient has been having some issues at home lately and feeling overwhelmed.  He states that this has been a consistent concern since last Christmas on 2024.  He reports that the patient is restless at home and is continually trying to take care of the house despite being asked  to rest.  He states that ever since she was discharged from her first admission on 10/22/2023, she stopped taking medications only a day after being released.  When asked about why she stopped the medication she just states that she does not like feeling tired does not like the way the medications make her feel.  Son is concerned with her cognitive status as he feels that she is changing unable to recall and also being unable to think straight.  He reports that he is not able to supervise his mother as he works third shift and sleeps for  majority of the day.  He also explained that the the amount of bruising on her body is due to multiple falls at home.  He states that the major concern with his mother is the fact that she does not stick with the medication recommendations and does not take her medications as she should.  Review of Systems  Constitutional:  Positive for malaise/fatigue and weight loss.  HENT: Negative.    Eyes: Negative.   Respiratory: Negative.    Cardiovascular: Negative.   Gastrointestinal: Negative.   Genitourinary: Negative.   Musculoskeletal:  Positive for myalgias.  Skin: Negative.   Psychiatric/Behavioral:  Positive for depression and suicidal ideas. The patient is nervous/anxious.      Psychiatric and Social History  Psychiatric History:  Information collected from chart review  Prev Dx/Sx: Major depressive disorder, PTSD, GAD, insomnia Current Psych Provider: [facility] Home Meds (current): Lexapro, BuSpar, gabapentin, hydroxyzine, trazodone Previous Med Trials: Denies Therapy: Requested but unable to get an appointment  Prior Psych Hospitalization: Recent hospitalization from October 16, 2023 to October 22, 2023 at [facility] Prior Self Harm: Endorses Prior Violence: Denies  Family Psych History: States all family had depression Family Hx suicide: Denies  Social History:   Educational Hx: Dropped out of ninth grade Occupational Hx: Worked as a Psychologist, 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 rate 18, height 5'  3" (1.6 m), weight 49 kg, SpO2 95%. Body mass index is 19.13 kg/m.  Physical Exam Vitals and nursing note reviewed.  HENT:     Head: Normocephalic.     Nose: Nose normal.     Mouth/Throat:     Mouth: Mucous membranes are dry.  Cardiovascular:     Rate and Rhythm: Normal rate.  Pulmonary:     Effort: Pulmonary effort is normal.  Skin:    General: Skin is warm and dry.  Neurological:     Mental Status: She is alert.  Psychiatric:        Attention and Perception: Attention and perception normal.        Mood and Affect: Mood is anxious and depressed.        Speech: Speech normal.        Behavior: Behavior is withdrawn.        Thought Content: Thought content includes suicidal ideation. Thought content includes suicidal plan.        Cognition and Memory: Cognition  and memory normal.        Judgment: Judgment is impulsive.     Mental Status Exam: General Appearance: Fairly Groomed  Orientation:  Full (Time, Place, and Person)  Memory:  Immediate;   Good Recent;   Good Remote;   Good  Concentration:  Concentration: Fair and Attention Span: Fair  Recall:  Fair  Attention  Fair  Eye Contact:  Fair  Speech:  Clear and Coherent  Language:  Fair  Volume:  Decreased  Mood: Depressed  Affect:  Depressed and Flat  Thought Process:  Linear  Thought Content:  Illogical  Suicidal Thoughts:  Yes.  with intent/plan  Homicidal Thoughts:  No  Judgement:  Fair  Insight:  Fair  Psychomotor Activity:  Normal  Akathisia:  No  Fund of Knowledge:  Fair      Assets:  Housing  Cognition:  WNL  ADL's:  Intact  AIMS (if indicated):        Other History   These have been pulled in through the EMR, reviewed, and updated if appropriate.  Family History:  The patient's family history includes Alcohol abuse in her son; Brain cancer in her paternal aunt; Cancer in her father, paternal uncle, paternal uncle, and paternal uncle; Depression in her mother; Diabetes in her brother and sister;  Drug abuse in her son; Endometriosis in her daughter; Heart attack in her father and maternal grandfather; Hypertension in her brother and sister; Kidney cancer (age of onset: 42) in her sister; Other in her mother; Stroke in her father.  Medical History: Past Medical History:  Diagnosis Date   Abdominal pain, acute, left upper quadrant    Abdominal pain, epigastric    Abnormal CT scan, sigmoid colon    Abnormal vaginal Pap smear    Adenocarcinoma of colon (HCC) 09/13/2015   Partial colon resection and chemo tx's.    Anemia    Anxiety    Anxiety disorder, unspecified    Aphasia    Asthma    Cancer (HCC)    endometrial; cancer cells in intestine   Chronic pain of left knee    Colon neoplasm    Constipation    Conversion reaction    COPD (chronic obstructive pulmonary disease) (HCC)    no definite diagnosis   Depression    Diverticulitis    Diverticulosis of colon without hemorrhage    Dyspareunia 05/16/2015   Dysphasia    Endometrial cancer (HCC)    Esophageal dysphagia    Family history of cancer    Family history of kidney cancer    Ganglion of left knee    Gastric intestinal metaplasia    GERD (gastroesophageal reflux disease)    Glaucoma    Hematochezia    History of colonic polyps    Hyperlipidemia    Indigestion    Lynch syndrome    Mucosal abnormality of stomach    Nausea and vomiting    Neuropathy    feet and hands   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 drug (HCC)    PONV (postoperative nausea and vomiting)    Pre-diabetes    Primary osteoarthritis of left knee    Rectal bleeding    Reflux esophagitis    Soft tissue swelling of knee joint    Uterine fibroid    UTI (urinary tract infection) 04/26/2023   on cipro   Vaginal dryness 05/16/2015   Vaginal itching    Vaginal Pap smear, abnormal     Surgical History: Past Surgical History:  Procedure Laterality Date   ABDOMINAL HYSTERECTOMY     APPENDECTOMY     BIOPSY N/A 03/14/2015   Procedure: BIOPSY;   Surgeon: Corbin Ade, MD;  Location: [facility];  Service: Endoscopy;  Laterality: N/A;  Gastric   COLONOSCOPY N/A 01/28/2017   Procedure: COLONOSCOPY;  Surgeon: Corbin Ade, MD;  Location: [facility];  Service: Endoscopy;  Laterality: N/A;  11:30am   COLONOSCOPY WITH PROPOFOL N/A 03/14/2015   RMR: Internal hemorrhoids. colonic diverticulosis. Incomplete examination. Prepartation inadequate.   COLONOSCOPY WITH PROPOFOL N/A 07/04/2015   RMR: Colonic diverticulosis . Large polypoid lesion in the vicinity of the hepatic flexure status post saline-assisted piecmeal snare polypectomy  with ablation and tattooing as described. Sigmoid polyp removed as described above. sigmoid colon polyp hyperplastic, hepatic flexure polyp with TA with focal high grade dysplasia    COLONOSCOPY WITH PROPOFOL N/A 11/03/2018   Procedure: COLONOSCOPY WITH PROPOFOL;  Surgeon: Wyline Mood, MD;  Location: [facility] ENDOSCOPY;  Service: Gastroenterology;  Laterality: N/A;   COLONOSCOPY WITH PROPOFOL N/A 12/09/2020   Procedure: COLONOSCOPY WITH PROPOFOL;  Surgeon: Wyline Mood, MD;  Location: [facility] ENDOSCOPY;  Service: Gastroenterology;  Laterality: N/A;  COVID POSITIVE 10/30/2020   COLONOSCOPY WITH PROPOFOL N/A 01/20/2021   Procedure: COLONOSCOPY WITH PROPOFOL;  Surgeon: Wyline Mood, MD;  Location: [facility] ENDOSCOPY;  Service: Gastroenterology;  Laterality: N/A;   COLONOSCOPY WITH PROPOFOL N/A 12/10/2021   Procedure: COLONOSCOPY WITH PROPOFOL;  Surgeon: Wyline Mood, MD;  Location: [facility] ENDOSCOPY;  Service: Gastroenterology;  Laterality: N/A;   COLONOSCOPY WITH PROPOFOL N/A 02/16/2023   Procedure: COLONOSCOPY WITH PROPOFOL;  Surgeon: Wyline Mood, MD;  Location: [facility] ENDOSCOPY;  Service: Gastroenterology;  Laterality: N/A;   ENTEROSCOPY N/A 02/12/2020   Procedure: ENTEROSCOPY;  Surgeon: Wyline Mood, MD;  Location: [facility] ENDOSCOPY;  Service: Gastroenterology;  Laterality: N/A;   ESOPHAGEAL DILATION N/A 03/14/2015   Procedure:  ESOPHAGEAL DILATION;  Surgeon: Corbin Ade, MD;  Location: [facility];  Service: Endoscopy;  Laterality: N/AElease Hashimoto 54   ESOPHAGOGASTRODUODENOSCOPY N/A 05/19/2016   Procedure: ESOPHAGOGASTRODUODENOSCOPY (EGD);  Surgeon: Corbin Ade, MD;  [facility]: [facility];  Service: Endoscopy;  Laterality: N/A;  215   ESOPHAGOGASTRODUODENOSCOPY N/A 01/28/2017   Procedure: ESOPHAGOGASTRODUODENOSCOPY (EGD);  Surgeon: Corbin Ade, MD;  [facility]: [facility];  Service: Endoscopy;  Laterality: N/A;   ESOPHAGOGASTRODUODENOSCOPY N/A 12/10/2021   Procedure: ESOPHAGOGASTRODUODENOSCOPY (EGD);  Surgeon: Wyline Mood, MD;  Location[facility] ENDOSCOPY;  Service: Gastroenterology;  Laterality: N/A;   ESOPHAGOGASTRODUODENOSCOPY (EGD) WITH PROPOFOL N/A 03/14/2015   RMR: Mild erosive reflux esophagitis status post passage o f a Maloney dilator. Abnormal gastric mucosa of uncertain significance as described above. status post biopsy, benign   ESOPHAGOGASTRODUODENOSCOPY (EGD) WITH PROPOFOL N/A 11/18/2017   Procedure: ESOPHAGOGASTRODUODENOSCOPY (EGD) WITH PROPOFOL;  Surgeon: Corbin Ade, MD;  [facility];  Service: Endoscopy;  Laterality: N/A;  12:15pm   ESOPHAGOGASTRODUODENOSCOPY (EGD) WITH PROPOFOL N/A 11/03/2018   Procedure: ESOPHAGOGASTRODUODENOSCOPY (EGD) WITH PROPOFOL;  Surgeon: Wyline Mood, MD;  Location: [facility] ENDOSCOPY;  Service: Gastroenterology;  Laterality: N/A;   ESOPHAGOGASTRODUODENOSCOPY (EGD) WITH PROPOFOL N/A 02/16/2023   Procedure: ESOPHAGOGASTRODUODENOSCOPY (EGD) WITH PROPOFOL;  Surgeon: Wyline Mood, MD;  Location: [facility] ENDOSCOPY;  Service: Gastroenterology;  Laterality: N/A;   GIVENS CAPSULE STUDY N/A 12/25/2019   Procedure: GIVENS CAPSULE STUDY;  Surgeon: Wyline Mood, MD;  Location: [facility] ENDOSCOPY;  Service: Gastroenterology;  Laterality: N/A;   MALONEY DILATION N/A 05/19/2016   Procedure: [name] DILATION;  Surgeon: Corbin Ade, MD;  Location: [facility];  Service: Endoscopy;   Laterality: N/A;   MALONEY DILATION N/A 01/28/2017   Procedure: [name] DILATION;  Surgeon: Corbin Ade, MD;  Location: [facility];  Service: Endoscopy;  Laterality: N/A;   MALONEY DILATION N/A 11/18/2017   Procedure: [name] DILATION;  Surgeon: Corbin Ade, MD;  Location: [facility];  Service: Endoscopy;  Laterality: N/A;   PARTIAL COLECTOMY  08/30/2015   polyp with adenocarcinoma   PARTIAL COLECTOMY  08/29/2018   POLYPECTOMY N/A 07/04/2015   Procedure: POLYPECTOMY;  Surgeon: Corbin Ade, MD;  Location: [facility];  Service: Endoscopy;  Laterality: N/A;   PORTACATH PLACEMENT Right 09/21/2014   TUBAL LIGATION       Medications:  No current facility-administered medications for this encounter.  Current Outpatient Medications:    albuterol (PROVENTIL HFA;VENTOLIN HFA) 108 (90 Base) MCG/ACT inhaler, Inhale 2 puffs into the lungs every 2 (two) hours as needed for wheezing or shortness of breath (cough)., Disp: 1 Inhaler, Rfl: 3   albuterol (PROVENTIL) (2.5 MG/3ML) 0.083% nebulizer solution, Take 3 mLs (2.5 mg total) by nebulization every 6 (six) hours as needed for wheezing or shortness of breath., Disp: 75 mL, Rfl: 12   aspirin EC 81 MG tablet, Take 1 tablet (81 mg total) by mouth daily. Swallow whole., Disp: 90 each, Rfl: 12   atorvastatin (LIPITOR) 40 MG tablet, Take 1 tablet (40 mg total) by mouth daily., Disp: 30 tablet, Rfl: 0   brimonidine (ALPHAGAN) 0.2 % ophthalmic solution, Place 1 drop into the left eye at bedtime., Disp: 20 each, Rfl: 12   brinzolamide (AZOPT) 1 % ophthalmic suspension, Place 1 drop into the left eye at bedtime., Disp: 15 each, Rfl: 12   busPIRone (BUSPAR) 10 MG tablet, Take 1 tablet (10 mg total) by mouth 2 (two) times daily., Disp: 60 tablet, Rfl: 0   clopidogrel (PLAVIX) 75 MG tablet, Take 1 tablet (75 mg total) by mouth daily., Disp: 30 tablet, Rfl: 0   docusate sodium (COLACE) 100 MG capsule, Take 1 capsule (100 mg total) by mouth 2 (two) times  daily. (Patient not taking: Reported on 10/28/2023), Disp: 30 capsule, Rfl: 0   escitalopram (LEXAPRO) 20 MG tablet, Take 1 tablet (20 mg total) by mouth at bedtime., Disp: 30 tablet, Rfl: 0   feeding supplement (ENSURE ENLIVE / ENSURE PLUS) LIQD, Take 237 mLs by mouth 2 (two) times daily between meals. (Patient not taking: Reported on 10/28/2023), Disp: 237 mL, Rfl: 12   ferrous sulfate 325 (65 FE) MG tablet, Take 1 tablet (325 mg total) by mouth every other day., Disp: 45 tablet, Rfl: 0   fluticasone (FLONASE) 50 MCG/ACT nasal spray, Place 1 spray into both nostrils daily., Disp: 1 g, Rfl: 0   gabapentin (NEURONTIN) 100 MG capsule, Take 1 capsule (100 mg total) by mouth daily., Disp: 30 capsule, Rfl: 0   gabapentin (NEURONTIN) 100 MG capsule, Take 2 capsules (200 mg total) by mouth at bedtime., Disp: 60 capsule, Rfl: 0   latanoprost (XALATAN) 0.005 % ophthalmic solution, Place 1 drop into the left eye at bedtime., Disp: 10 each, Rfl: 12   loratadine (CLARITIN) 10 MG tablet, Take 10 mg by mouth daily., Disp: , Rfl:    meclizine (ANTIVERT) 25 MG tablet, Take 1 tablet (25 mg total) by mouth 3 (three) times daily as needed for dizziness. (Patient not taking: Reported on 10/28/2023), Disp: 30 tablet, Rfl: 0   montelukast (SINGULAIR) 10 MG tablet, Take 1 tablet (10 mg  total) by mouth daily., Disp: 30 tablet, Rfl: 0   ondansetron (ZOFRAN-ODT) 4 MG disintegrating tablet, 4mg  ODT q4 hours prn nausea/vomit, Disp: 12 tablet, Rfl: 0   pantoprazole (PROTONIX) 40 MG tablet, Take 1 tablet (40 mg total) by mouth daily before breakfast., Disp: 15 tablet, Rfl: 0   Plecanatide (TRULANCE) 3 MG TABS, Take 1 tablet by mouth daily., Disp: 30 tablet, Rfl: 6   senna-docusate (SENOKOT-S) 8.6-50 MG tablet, Take 2 tablets by mouth 2 (two) times daily., Disp: 30 tablet, Rfl: 0   sodium chloride (OCEAN) 0.65 % nasal spray, Place into the nose., Disp: , Rfl:    traZODone (DESYREL) 50 MG tablet, Take 1 tablet (50 mg total) by mouth at  bedtime., Disp: 30 tablet, Rfl: 0  Allergies: Allergies  Allergen Reactions   Codeine Nausea And Vomiting and Rash    Juliann Pares, NP

## 2023-11-03 NOTE — Group Note (Signed)
Date:  11/03/2023 Time:  9:45 PM  Group Topic/Focus:  Coping With Mental Health Crisis:   The purpose of this group is to help patients identify strategies for coping with mental health crisis.  Group discusses possible causes of crisis and ways to manage them effectively.    Participation Level:  Active  Participation Quality:  Appropriate  Affect:  Appropriate  Cognitive:  Appropriate  Insight: Appropriate  Engagement in Group:  Engaged  Modes of Intervention:  Activity  Additional Comments:    Zinia Innocent 11/03/2023, 9:45 PM
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
## 2023-11-03 NOTE — ED Notes (Signed)
Patient paper voluntary consent signed at this time.

## 2023-11-03 NOTE — ED Notes (Signed)
Belongings sent with patient to psychiatric inpatient unit.

## 2023-11-03 NOTE — ED Notes (Signed)
Patient has complaints of headache and anxiety at this time.

## 2023-11-03 NOTE — Plan of Care (Signed)
  Problem: Education: Goal: Knowledge of [place] [facility] Education information/materials will improve Outcome: Not Progressing Goal: Emotional status will improve Outcome: Not Progressing Goal: Mental status will improve Outcome: Not Progressing Goal: Verbalization of understanding the information provided will improve Outcome: Not Progressing   Problem: Activity: Goal: Interest or engagement in activities will improve Outcome: Not Progressing Goal: Sleeping patterns will improve Outcome: Not Progressing   Problem: Coping: Goal: Ability to verbalize frustrations and anger appropriately will improve Outcome: Not Progressing Goal: Ability to demonstrate self-control will improve Outcome: Not Progressing   Problem: Health Behavior/Discharge Planning: Goal: Identification of resources available to assist in meeting health care needs will improve Outcome: Not Progressing Goal: Compliance with treatment plan for underlying cause of condition will improve Outcome: Not Progressing   Problem: Physical Regulation: Goal: Ability to maintain clinical measurements within normal limits will improve Outcome: Not Progressing   Problem: Safety: Goal: Periods of time without injury will increase Outcome: Not Progressing   Problem: Education: Goal: Ability to make informed decisions regarding treatment will improve Outcome: Not Progressing   Problem: Coping: Goal: Coping ability will improve Outcome: Not Progressing   Problem: Health Behavior/Discharge Planning: Goal: Identification of resources available to assist in meeting health care needs will improve Outcome: Not Progressing   Problem: Medication: Goal: Compliance with prescribed medication regimen will improve Outcome: Not Progressing   Problem: Self-Concept: Goal: Ability to disclose and discuss suicidal ideas will improve Outcome: Not Progressing Goal: Will verbalize positive feelings about self Outcome: Not  Progressing Note: Patient is initiating therapy. Patient will work on increased adherence, avoid flare triggers, and be evaluated at upcoming provider appointment to assess progress

## 2023-11-03 NOTE — ED Notes (Signed)
Report given to [place], RN on [employer].

## 2023-11-03 NOTE — ED Notes (Signed)
Pt up to restroom. Gait slow but steady. Sterile cup provided to urine for u/a

## 2023-11-03 NOTE — ED Notes (Signed)
MD at the bedside for pt evaluation

## 2023-11-03 NOTE — ED Notes (Signed)
Pt given water and graham crackers with peanut butter.

## 2023-11-03 NOTE — ED Triage Notes (Addendum)
First Nurse Note: Pt via [facility] from home. Pt c/o SI, states she took extra doses of Trazodone and Meclizine unknown amount yesterday. "I dont know what I took and how many of them." EMS was called out for "shaking." Pt is A&Ox4 and NAD 122 CBG  160/74 BP  60 HR  98% on RA

## 2023-11-03 NOTE — ED Provider Notes (Signed)
[facility] Provider Note    Event Date/Time   First MD Initiated Contact with Patient 11/03/23 1346     (approximate)   History   Chief Complaint Suicidal   HPI  Julia Osborne is a 63 y.o. female with past medical history of anemia, COPD, colon cancer, endometrial cancer, depression, and PTSD who presents to the ED for suicidal ideation.  Patient states "I am exhausted," and reports increasing depression recently with thoughts of harming herself.  She reports thoughts of cutting herself or taking extra medication, but has not gone through with this.  She denies any auditory or visual hallucinations, has not had any thoughts of hurting others.  She currently denies any medical complaints, states she has been taking medications as prescribed.     Physical Exam   Triage Vital Signs: ED Triage Vitals  Encounter Vitals Group     BP 11/03/23 1315 (!) 165/86     Systolic BP Percentile --      Diastolic BP Percentile --      Pulse Rate 11/03/23 1315 62     Resp 11/03/23 1315 18     Temp --      Temp src --      SpO2 11/03/23 1315 95 %     Weight 11/03/23 1313 108 lb (49 kg)     Height 11/03/23 1313 5\' 3"  (1.6 m)     Head Circumference --      Peak Flow --      Pain Score 11/03/23 1313 8     Pain Loc --      Pain Education --      Exclude from Growth Chart --     Most recent vital signs: Vitals:   11/03/23 1315  BP: (!) 165/86  Pulse: 62  Resp: 18  SpO2: 95%    Constitutional: Alert and oriented. Eyes: Conjunctivae are normal. Head: Atraumatic. Nose: No congestion/rhinnorhea. Mouth/Throat: Mucous membranes are moist.  Cardiovascular: Normal rate, regular rhythm. Grossly normal heart sounds.  2+ radial pulses bilaterally. Respiratory: Normal respiratory effort.  No retractions. Lungs CTAB. Gastrointestinal: Soft and nontender. No distention. Musculoskeletal: No lower extremity tenderness nor edema.  Neurologic:  Normal speech and  language. No gross focal neurologic deficits are appreciated.    ED Results / Procedures / Treatments   Labs (all labs ordered are listed, but only abnormal results are displayed) Labs Reviewed  SALICYLATE LEVEL - Abnormal; Notable for the following components:      Result Value   Salicylate Lvl <7.0 (*)    All other components within normal limits  ACETAMINOPHEN LEVEL - Abnormal; Notable for the following components:   Acetaminophen (Tylenol), Serum <10 (*)    All other components within normal limits  COMPREHENSIVE METABOLIC PANEL  ETHANOL  CBC  URINE DRUG SCREEN, QUALITATIVE ([facility] ONLY)  CBG MONITORING, ED   PROCEDURES:  Critical Care performed: No  Procedures   MEDICATIONS ORDERED IN ED: Medications - No data to display   IMPRESSION / MDM / ASSESSMENT AND PLAN / ED COURSE  I reviewed the triage vital signs and the nursing notes.                              63 y.o. female with past medical history of anemia, COPD, colon cancer, endometrial cancer, depression, and PTSD who presents to the ED with increasing depression and suicidal ideation over the past few days.  Patient's presentation is most consistent with acute presentation with potential threat to life or bodily function.  Differential diagnosis includes, but is not limited to, suicidal ideation, homicidal ideation, psychosis, depression, anxiety, medication noncompliance, substance abuse.  Patient nontoxic-appearing and in no acute distress, vital signs are unremarkable.  She denies any medical complaints and screening labs are unremarkable with no significant anemia, leukocytosis, electrolyte abnormality, or AKI.  LFTs are unremarkable, Tylenol and salicylate levels are undetectable.  Patient may be medically cleared for psychiatric disposition.  We will maintain voluntary status as she is calm and cooperative, desires admission.  The patient has been placed in psychiatric observation due to the need to  provide a safe environment for the patient while obtaining psychiatric consultation and evaluation, as well as ongoing medical and medication management to treat the patient's condition.  The patient has not been placed under full IVC at this time.      FINAL CLINICAL IMPRESSION(S) / ED DIAGNOSES   Final diagnoses:  Suicidal ideation     Rx / DC Orders   ED Discharge Orders     None        Note:  This document was prepared using Dragon voice recognition software and may include unintentional dictation errors.   Chesley Noon, MD 11/03/23 1524

## 2023-11-04 DIAGNOSIS — R45851 Suicidal ideations: Secondary | ICD-10-CM

## 2023-11-04 MED ORDER — ASPIRIN 81 MG PO TBEC
81.0000 mg | DELAYED_RELEASE_TABLET | Freq: Every day | ORAL | Status: DC
  Administered 2023-11-04 – 2023-11-13 (×10): 81 mg via ORAL
  Filled 2023-11-04 (×10): qty 1

## 2023-11-04 MED ORDER — ENSURE ENLIVE PO LIQD
237.0000 mL | Freq: Two times a day (BID) | ORAL | Status: DC
  Administered 2023-11-04 – 2023-11-11 (×11): 237 mL via ORAL

## 2023-11-04 MED ORDER — ATORVASTATIN CALCIUM 10 MG PO TABS
40.0000 mg | ORAL_TABLET | Freq: Every day | ORAL | Status: DC
  Administered 2023-11-04 – 2023-11-13 (×10): 40 mg via ORAL
  Filled 2023-11-04 (×10): qty 4

## 2023-11-04 MED ORDER — TRAZODONE HCL 50 MG PO TABS
50.0000 mg | ORAL_TABLET | Freq: Every day | ORAL | Status: DC
  Administered 2023-11-04 – 2023-11-08 (×5): 50 mg via ORAL
  Filled 2023-11-04 (×5): qty 1

## 2023-11-04 MED ORDER — ALBUTEROL SULFATE (2.5 MG/3ML) 0.083% IN NEBU: 2.5000 mg | INHALATION_SOLUTION | Freq: Four times a day (QID) | RESPIRATORY_TRACT | Status: DC | PRN

## 2023-11-04 MED ORDER — FLUTICASONE PROPIONATE 50 MCG/ACT NA SUSP
1.0000 | Freq: Every day | NASAL | Status: DC
  Administered 2023-11-04 – 2023-11-13 (×10): 1 via NASAL
  Filled 2023-11-04: qty 16

## 2023-11-04 MED ORDER — ALBUTEROL SULFATE (2.5 MG/3ML) 0.083% IN NEBU: 3.0000 mL | INHALATION_SOLUTION | RESPIRATORY_TRACT | Status: DC | PRN

## 2023-11-04 MED ORDER — SENNOSIDES-DOCUSATE SODIUM 8.6-50 MG PO TABS
2.0000 | ORAL_TABLET | Freq: Two times a day (BID) | ORAL | Status: DC
  Administered 2023-11-04: 2 via ORAL
  Filled 2023-11-04 (×3): qty 2

## 2023-11-04 MED ORDER — LATANOPROST 0.005 % OP SOLN
1.0000 [drp] | Freq: Every day | OPHTHALMIC | Status: DC
  Administered 2023-11-04 – 2023-11-12 (×9): 1 [drp] via OPHTHALMIC
  Filled 2023-11-04: qty 2.5

## 2023-11-04 MED ORDER — GABAPENTIN 100 MG PO CAPS
200.0000 mg | ORAL_CAPSULE | Freq: Every day | ORAL | Status: DC
  Administered 2023-11-04 – 2023-11-12 (×9): 200 mg via ORAL
  Filled 2023-11-04 (×9): qty 2

## 2023-11-04 MED ORDER — FERROUS SULFATE 325 (65 FE) MG PO TABS
325.0000 mg | ORAL_TABLET | ORAL | Status: DC
  Administered 2023-11-04 – 2023-11-12 (×5): 325 mg via ORAL
  Filled 2023-11-04 (×7): qty 1

## 2023-11-04 MED ORDER — PANTOPRAZOLE SODIUM 40 MG PO TBEC
40.0000 mg | DELAYED_RELEASE_TABLET | Freq: Every day | ORAL | Status: DC
  Administered 2023-11-05 – 2023-11-13 (×9): 40 mg via ORAL
  Filled 2023-11-04 (×8): qty 1

## 2023-11-04 MED ORDER — GABAPENTIN 100 MG PO CAPS
100.0000 mg | ORAL_CAPSULE | Freq: Every day | ORAL | Status: DC
  Administered 2023-11-04 – 2023-11-13 (×10): 100 mg via ORAL
  Filled 2023-11-04 (×10): qty 1

## 2023-11-04 MED ORDER — BRIMONIDINE TARTRATE 0.2 % OP SOLN
1.0000 [drp] | Freq: Every day | OPHTHALMIC | Status: DC
  Administered 2023-11-04 – 2023-11-12 (×9): 1 [drp] via OPHTHALMIC
  Filled 2023-11-04: qty 5

## 2023-11-04 MED ORDER — ONDANSETRON 4 MG PO TBDP: 4.0000 mg | ORAL_TABLET | Freq: Three times a day (TID) | ORAL | Status: DC | PRN

## 2023-11-04 MED ORDER — MECLIZINE HCL 25 MG PO TABS: 25.0000 mg | ORAL_TABLET | Freq: Three times a day (TID) | ORAL | Status: DC | PRN

## 2023-11-04 MED ORDER — ESCITALOPRAM OXALATE 10 MG PO TABS
20.0000 mg | ORAL_TABLET | Freq: Every day | ORAL | Status: DC
  Administered 2023-11-04 – 2023-11-12 (×9): 20 mg via ORAL
  Filled 2023-11-04 (×9): qty 2

## 2023-11-04 MED ORDER — PLECANATIDE 3 MG PO TABS: 1.0000 | ORAL_TABLET | Freq: Every day | ORAL | Status: DC

## 2023-11-04 MED ORDER — BRINZOLAMIDE 1 % OP SUSP
1.0000 [drp] | Freq: Every day | OPHTHALMIC | Status: DC
  Administered 2023-11-04 – 2023-11-12 (×9): 1 [drp] via OPHTHALMIC
  Filled 2023-11-04: qty 10

## 2023-11-04 MED ORDER — BUSPIRONE HCL 5 MG PO TABS
10.0000 mg | ORAL_TABLET | Freq: Two times a day (BID) | ORAL | Status: DC
  Administered 2023-11-04 – 2023-11-07 (×7): 10 mg via ORAL
  Filled 2023-11-04 (×7): qty 2

## 2023-11-04 MED ORDER — CLOPIDOGREL BISULFATE 75 MG PO TABS
75.0000 mg | ORAL_TABLET | Freq: Every day | ORAL | Status: DC
  Administered 2023-11-04 – 2023-11-13 (×10): 75 mg via ORAL
  Filled 2023-11-04 (×10): qty 1

## 2023-11-04 MED ORDER — DOCUSATE SODIUM 100 MG PO CAPS
100.0000 mg | ORAL_CAPSULE | Freq: Two times a day (BID) | ORAL | Status: DC
  Administered 2023-11-04: 100 mg via ORAL
  Filled 2023-11-04 (×3): qty 1

## 2023-11-04 NOTE — Progress Notes (Signed)
Physical Therapy Group Note  Group Topic:  Functional, Dynamic Balance Group Date: 11/04/2023 Group Time (start and end): 1610-9604 Facilitators:  Cephus Slater, PT  Group Description: Group discussed impact of balance on safety and independence with functional tasks.  Identified and discussed any self-perceived balance deficits to personalize information.  Discussed and reviewed strategies to address/improve balance deficits: use of assist devices, activity pacing/energy conservation, environment/home safety modifications, focusing attention/minimizing distraction.  Reviewed and participated with standing LE therex designed to target dynamic balance reactions and LE strength/stability; provided handouts with HEP to be utilized outside of group time as appropriate.  Allowed time for questions and further discussion on any balance or mobility concerns/needs.   Therapeutic Goal(s): Identify and discuss any individual balance deficits and functional implications. Identify and discuss any environmental/home safety modifications that can optimize balance and safety for mobility within the home. Demonstrate understanding and performance of standing therex designed to target dynamic balance deficits.   Individual Participation:  Patient actively engaged with discussion throughout group time, indep identifying balance deficits, compensatory strategies and home modifications to address.  Completed series of standing LE therex, intermittently using bilat vs unilat UE support on table-top for safety/stabilization as needed, supervision level of assist throughout. No overt buckling or LOB; fair/good safety awareness and insight throughout balance activities.  Concluded session by indep identifying that she learned the importance of "stabilization" during group today.   Participation Level and Quality: Active, alert and attentive   Behavior: Appropriate; pleasant and cooperative   Speech/Thought Process:  Organized, logical and reasonable   Affect/Mood: Appropriate   Insight: Appropriate   Judgement: Appropriate   Modes of Intervention: Verbal discussion, demonstration, handout provided     Plan: Continue to engage patient in PT/OT groups 1-2x/week.  Maleta Pacha H. Manson Passey, PT, DPT, NCS 11/04/23, 1:56 PM [phone]

## 2023-11-04 NOTE — H&P (Signed)
Psychiatric Admission Assessment Adult  Patient Identification: Julia Osborne MRN:  [id number] Date of Evaluation:  11/04/2023 Chief Complaint:  Suicidal ideation [R45.851]   History of Present Illness: Julia Osborne is a 63 y.o. female admitted: Presented to the EDfor 11/03/2023  1:44 PM for suicidal attempt. She carries the psychiatric diagnoses of suicidal ideation, major depressive disorder, PTSD, GAD and has a past medical history of COPD, GERD, colon neoplasm, endometrial cancer, diverticulitis, Lynch syndrome.   On interview patient reports that she is feeling very depressed and overwhelmed.  After her recent discharge back home she reports that she went home had to clean up the whole house as her son did not let the dogs out and the dogs.  When probed everywhere on the floors.  Patient reports that she did well for few days taking her medicine, eating on time but then she started getting overwhelmed with the household work, lagging behind in pain meds.  She reports ongoing arguments with her son where she reports that her son started doing alcohol and drugs, staying out of the house late in the night, sleeping all day and working third shift.  Patient continues to inform that son gets upset if she makes any noise in the kitchen during the daytime like trying to cook or clean.  She reports she stopped eating, not sleeping well.  She reports feeling overwhelmed, hopeless, worthless.  She reports anhedonia.  She reports overdosing on her medications with the intention to kill herself.  She then informed that she called her friend called the ambulance to get her to the hospital.  Patient acknowledges that she will feel better if her son leaves the home and somebody takes care of her dogs but she reportedly is scared to have that conversation with her son.  She reports worsening anxiety and panic attacks.  She has low energy and motivation.  She denies auditory/visual hallucinations.  She rates her  anxiety as 8 out of 10, 10 being the worst.  She had 1 recent panic attacks which was yesterday.  Total Time spent with patient: 1 hour Sleep  Sleep:Sleep: Poor  Past Psychiatric History:  Psychiatric History:  Information collected from patient  Prev Dx/Sx: Major depressive disorder, PTSD, GAD, insomnia Current Psych Provider: [facility] Home Meds (current): Lexapro, BuSpar, gabapentin, hydroxyzine, trazodone Previous Med Trials: Denies Therapy: Requested but unable to get an appointment   Prior Psych Hospitalization: Recent hospitalization from October 16, 2023 to October 22, 2023 at [facility] Prior Self Harm: Endorses Prior Violence: Denies   Family Psych History: States all family had depression Family Hx suicide: Denies   Social History:   Educational Hx: Dropped out of ninth grade Occupational Hx: Worked as a Psychologist, 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 Yes.    Has the patient been a risk to self in the past 6 months? Yes.    Has the patient been a risk to self within the distant past? No.  Is the patient a risk to others? No.  Has the patient been a risk to others in the past 6 months? No.  Has the patient been a risk to others within the distant past? No.   [place] Scale:  Flowsheet Row Admission (  Current) from 11/03/2023 in [facility] BEHAVIORAL MEDICINE Most recent reading at 11/03/2023  9:20 PM ED from 11/03/2023 in [facility] Emergency Department at [facility] Most recent reading at 11/03/2023  3:24 PM ED from 11/01/2023 in [facility] Emergency Department at [facility] Most recent reading at 11/01/2023 12:57 PM  C-SSRS RISK CATEGORY High Risk High Risk No Risk        Past  Medical History:  Past Medical History:  Diagnosis Date   Abdominal pain, acute, left upper quadrant    Abdominal pain, epigastric    Abnormal CT scan, sigmoid colon    Abnormal vaginal Pap smear    Adenocarcinoma of colon (HCC) 09/13/2015   Partial colon resection and chemo tx's.    Anemia    Anxiety    Anxiety disorder, unspecified    Aphasia    Asthma    Cancer (HCC)    endometrial; cancer cells in intestine   Chronic pain of left knee    Colon neoplasm    Constipation    Conversion reaction    COPD (chronic obstructive pulmonary disease) (HCC)    no definite diagnosis   Depression    Diverticulitis    Diverticulosis of colon without hemorrhage    Dyspareunia 05/16/2015   Dysphasia    Endometrial cancer (HCC)    Esophageal dysphagia    Family history of cancer    Family history of kidney cancer    Ganglion of left knee    Gastric intestinal metaplasia    GERD (gastroesophageal reflux disease)    Glaucoma    Hematochezia    History of colonic polyps    Hyperlipidemia    Indigestion    Lynch syndrome    Mucosal abnormality of stomach    Nausea and vomiting    Neuropathy    feet and hands   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 drug (HCC)    PONV (postoperative nausea and vomiting)    Pre-diabetes    Primary osteoarthritis of left knee    Rectal bleeding    Reflux esophagitis    Soft tissue swelling of knee joint    Uterine fibroid    UTI (urinary tract infection) 04/26/2023   on cipro   Vaginal dryness 05/16/2015   Vaginal itching    Vaginal Pap smear, abnormal     Past Surgical History:  Procedure Laterality Date   ABDOMINAL HYSTERECTOMY     APPENDECTOMY     BIOPSY N/A 03/14/2015   Procedure: BIOPSY;  Surgeon: Corbin Ade, MD;  Location: [facility] ORS;  Service: Endoscopy;  Laterality: N/A;  Gastric   COLONOSCOPY N/A 01/28/2017   Procedure: COLONOSCOPY;  Surgeon: Corbin Ade, MD;  Location: [facility] ENDO SUITE;  Service: Endoscopy;  Laterality: N/A;  11:30am    COLONOSCOPY WITH PROPOFOL N/A 03/14/2015   RMR: Internal hemorrhoids. colonic diverticulosis. Incomplete examination. Prepartation inadequate.   COLONOSCOPY WITH PROPOFOL N/A 07/04/2015   RMR: Colonic diverticulosis . Large polypoid lesion in the vicinity of the hepatic flexure status post saline-assisted piecmeal snare polypectomy  with ablation and tattooing as described. Sigmoid polyp removed as described above. sigmoid colon polyp hyperplastic, hepatic flexure polyp with TA with focal high grade dysplasia    COLONOSCOPY WITH PROPOFOL N/A 11/03/2018   Procedure: COLONOSCOPY WITH PROPOFOL;  Surgeon: Wyline Mood, MD;  Location[facility] ENDOSCOPY;  Service: Gastroenterology;  Laterality: N/A;   COLONOSCOPY WITH PROPOFOL N/A 12/09/2020   Procedure: COLONOSCOPY WITH PROPOFOL;  Surgeon: Wyline Mood, MD;  Location: [facility]  ENDOSCOPY;  Service: Gastroenterology;  Laterality: N/A;  COVID POSITIVE 10/30/2020   COLONOSCOPY WITH PROPOFOL N/A 01/20/2021   Procedure: COLONOSCOPY WITH PROPOFOL;  Surgeon: Wyline Mood, MD;  Location: [facility] ENDOSCOPY;  Service: Gastroenterology;  Laterality: N/A;   COLONOSCOPY WITH PROPOFOL N/A 12/10/2021   Procedure: COLONOSCOPY WITH PROPOFOL;  Surgeon: Wyline Mood, MD;  Location: [facility] ENDOSCOPY;  Service: Gastroenterology;  Laterality: N/A;   COLONOSCOPY WITH PROPOFOL N/A 02/16/2023   Procedure: COLONOSCOPY WITH PROPOFOL;  Surgeon: Wyline Mood, MD;  Location: [facility] ENDOSCOPY;  Service: Gastroenterology;  Laterality: N/A;   ENTEROSCOPY N/A 02/12/2020   Procedure: ENTEROSCOPY;  Surgeon: Wyline Mood, MD;  Location: [facility] ENDOSCOPY;  Service: Gastroenterology;  Laterality: N/A;   ESOPHAGEAL DILATION N/A 03/14/2015   Procedure: ESOPHAGEAL DILATION;  Surgeon: Corbin Ade, MD;  Location: [facility];  Service: Endoscopy;  Laterality: N/AElease Hashimoto 54   ESOPHAGOGASTRODUODENOSCOPY N/A 05/19/2016   Procedure: ESOPHAGOGASTRODUODENOSCOPY (EGD);  Surgeon: Corbin Ade, MD;  Location[facility] END[facility];  Service: Endoscopy;  Laterality: N/A;  215   ESOPHAGOGASTRODUODENOSCOPY N/A 01/28/2017   Procedure: ESOPHAGOGASTRODUODENOSCOPY (EGD);  Surgeon: Corbin Ade, MD;  [facility];  Service: Endoscopy;  Laterality: N/A;   ESOPHAGOGASTRODUODENOSCOPY N/A 12/10/2021   Procedure: ESOPHAGOGASTRODUODENOSCOPY (EGD);  Surgeon: Wyline Mood, MD;  Location: [facility] ENDOSCOPY;  Service: Gastroenterology;  Laterality: N/A;   ESOPHAGOGASTRODUODENOSCOPY (EGD) WITH PROPOFOL N/A 03/14/2015   RMR: Mild erosive reflux esophagitis status post passage o f a Maloney dilator. Abnormal gastric mucosa of uncertain significance as described above. status post biopsy, benign   ESOPHAGOGASTRODUODENOSCOPY (EGD) WITH PROPOFOL N/A 11/18/2017   Procedure: ESOPHAGOGASTRODUODENOSCOPY (EGD) WITH PROPOFOL;  Surgeon: Corbin Ade, MD;  [facility];  Service: Endoscopy;  Laterality: N/A;  12:15pm   ESOPHAGOGASTRODUODENOSCOPY (EGD) WITH PROPOFOL N/A 11/03/2018   Procedure: ESOPHAGOGASTRODUODENOSCOPY (EGD) WITH PROPOFOL;  Surgeon: Wyline Mood, MD;  Location: [facility] ENDOSCOPY;  Service: Gastroenterology;  Laterality: N/A;   ESOPHAGOGASTRODUODENOSCOPY (EGD) WITH PROPOFOL N/A 02/16/2023   Procedure: ESOPHAGOGASTRODUODENOSCOPY (EGD) WITH PROPOFOL;  Surgeon: Wyline Mood, MD;  Location: [facility] ENDOSCOPY;  Service: Gastroenterology;  Laterality: N/A;   GIVENS CAPSULE STUDY N/A 12/25/2019   Procedure: GIVENS CAPSULE STUDY;  Surgeon: Wyline Mood, MD;  Location: [facility] ENDOSCOPY;  Service: Gastroenterology;  Laterality: N/A;   MALONEY DILATION N/A 05/19/2016   Procedure: [name] DILATION;  Surgeon: Corbin Ade, MD;  [facility];  Service: Endoscopy;  Laterality: N/A;   MALONEY DILATION N/A 01/28/2017   Procedure: [name] DILATION;  Surgeon: Corbin Ade, MD;  [facility];  Service: Endoscopy;  Laterality: N/A;   MALONEY DILATION N/A 11/18/2017   Procedure: [name] DILATION;  Surgeon: Corbin Ade, MD;  Location[facility];  Service: Endoscopy;  Laterality: N/A;   PARTIAL COLECTOMY  08/30/2015   polyp with adenocarcinoma   PARTIAL COLECTOMY  08/29/2018   POLYPECTOMY N/A 07/04/2015   Procedure: POLYPECTOMY;  Surgeon: Corbin Ade, MD;  Location: [facility];  Service: Endoscopy;  Laterality: N/A;   PORTACATH PLACEMENT Right 09/21/2014   TUBAL LIGATION     Family History:  Family History  Problem Relation Age of Onset   Depression Mother    Other Mother        clot that went to heart, deceased age 70ss   Heart attack Father        age 57s, deceased   Stroke Father    Cancer Father        "at death determined he  was ate up with cancer"   Hypertension Sister    Kidney cancer Sister 42   Diabetes Sister    Diabetes Brother    Hypertension Brother    Brain cancer Paternal Aunt    Cancer Paternal Uncle        NOS   Cancer Paternal Uncle        NOS   Cancer Paternal Uncle        NOS   Heart attack Maternal Grandfather    Endometriosis Daughter    Alcohol abuse Son    Drug abuse Son    Colon cancer Neg Hx     Social History:  Social History   Substance and Sexual Activity  Alcohol Use No     Social History   Substance and Sexual Activity  Drug Use No      Allergies:   Allergies  Allergen Reactions   Codeine Nausea And Vomiting and Rash   Lab Results:  Results for orders placed or performed during the hospital encounter of 11/03/23 (from the past 48 hours)  Comprehensive metabolic panel     Status: None   Collection Time: 11/03/23  1:16 PM  Result Value Ref Range   Sodium 142 135 - 145 mmol/L   Potassium 3.8 3.5 - 5.1 mmol/L   Chloride 105 98 - 111 mmol/L   CO2 27 22 - 32 mmol/L   Glucose, Bld 94 70 - 99 mg/dL    Comment: Glucose reference range applies only to samples taken after fasting for at least 8 hours.   BUN 12 6 - 20 mg/dL   Creatinine, Ser 0.98 0.44 - 1.00 mg/dL   Calcium 9.3 8.9 - 11.9 mg/dL   Total Protein 7.3 6.5 - 8.1 g/dL    Albumin 4.5 3.5 - 5.0 g/dL   AST 19 15 - 41 U/L   ALT 23 0 - 44 U/L   Alkaline Phosphatase 47 38 - 126 U/L   Total Bilirubin 1.0 0.0 - 1.2 mg/dL   GFR, Estimated >14 >78 mL/min    Comment: (NOTE) Calculated using the CKD-EPI Creatinine Equation (2021)    Anion gap 10 5 - 15    Comment: Performed at [facility], [address] [place], [place] [address]  Ethanol     Status: None   Collection Time: 11/03/23  1:16 PM  Result Value Ref Range   Alcohol, Ethyl (B) <10 <10 mg/dL    Comment: (NOTE) Lowest detectable limit for serum alcohol is 10 mg/dL.  For medical purposes only. Performed at [facility], [address] [place], [place] [address]   Salicylate level     Status: Abnormal   Collection Time: 11/03/23  1:16 PM  Result Value Ref Range   Salicylate Lvl <7.0 (L) 7.0 - 30.0 mg/dL    Comment: Performed at [facility], [address] [place], [place] [address]  Acetaminophen level     Status: Abnormal   Collection Time: 11/03/23  1:16 PM  Result Value Ref Range   Acetaminophen (Tylenol), Serum <10 (L) 10 - 30 ug/mL    Comment: (NOTE) Therapeutic concentrations vary significantly. A range of 10-30 ug/mL  may be an effective concentration for many patients. However, some  are best treated at concentrations outside of this range. Acetaminophen concentrations >150 ug/mL at 4 hours after ingestion  and >50 ug/mL at 12 hours after ingestion are often associated with  toxic reactions.  Performed at [facility], [address]  [place], [place] [address]   cbc     Status: None   Collection Time: 11/03/23  1:16 PM  Result Value Ref Range   WBC 6.4 4.0 - 10.5 K/uL   RBC 3.93 3.87 - 5.11 MIL/uL   Hemoglobin 12.4 12.0 - 15.0 g/dL   HCT 19.1 47.8 - 29.5 %   MCV 93.1 80.0 - 100.0 fL   MCH 31.6 26.0 - 34.0 pg   MCHC 33.9 30.0 - 36.0 g/dL   RDW 62.1 30.8 - 65.7 %   Platelets 262 150 - 400 K/uL   nRBC 0.0 0.0 - 0.2 %    Comment:  Performed at [facility], [address] [place], [place] [address]  Urine Drug Screen, Qualitative     Status: None   Collection Time: 11/03/23  1:56 PM  Result Value Ref Range   Tricyclic, Ur Screen NONE DETECTED NONE DETECTED   Amphetamines, Ur Screen NONE DETECTED NONE DETECTED   MDMA (Ecstasy)Ur Screen NONE DETECTED NONE DETECTED   Cocaine Metabolite,Ur [place] NONE DETECTED NONE DETECTED   Opiate, Ur Screen NONE DETECTED NONE DETECTED   Phencyclidine (PCP) Ur S NONE DETECTED NONE DETECTED   Cannabinoid 50 Ng, Ur Energy NONE DETECTED NONE DETECTED   Barbiturates, Ur Screen NONE DETECTED NONE DETECTED   Benzodiazepine, Ur Scrn NONE DETECTED NONE DETECTED   Methadone Scn, Ur NONE DETECTED NONE DETECTED    Comment: (NOTE) Tricyclics + metabolites, urine    Cutoff 1000 ng/mL Amphetamines + metabolites, urine  Cutoff 1000 ng/mL MDMA (Ecstasy), urine              Cutoff 500 ng/mL Cocaine Metabolite, urine          Cutoff 300 ng/mL Opiate + metabolites, urine        Cutoff 300 ng/mL Phencyclidine (PCP), urine         Cutoff 25 ng/mL Cannabinoid, urine                 Cutoff 50 ng/mL Barbiturates + metabolites, urine  Cutoff 200 ng/mL Benzodiazepine, urine              Cutoff 200 ng/mL Methadone, urine                   Cutoff 300 ng/mL  The urine drug screen provides only a preliminary, unconfirmed analytical test result and should not be used for non-medical purposes. Clinical consideration and professional judgment should be applied to any positive drug screen result due to possible interfering substances. A more specific alternate chemical method must be used in order to obtain a confirmed analytical result. Gas chromatography / mass spectrometry (GC/MS) is the preferred confirm atory method. Performed at [facility], [address] [place], [place] [address]   SARS Coronavirus 2 by RT PCR (hospital order, performed in [facility] hospital lab) *cepheid single  result test* Anterior Nasal Swab     Status: None   Collection Time: 11/03/23  4:28 PM   Specimen: Anterior Nasal Swab  Result Value Ref Range   SARS Coronavirus 2 by RT PCR NEGATIVE NEGATIVE    Comment: (NOTE) SARS-CoV-2 target nucleic acids are NOT DETECTED.  The SARS-CoV-2 RNA is generally detectable in upper and lower respiratory specimens during the acute phase of infection. The lowest concentration of SARS-CoV-2 viral copies this assay can detect is 250 copies / mL. A negative result does not preclude SARS-CoV-2 infection and should not be used as the sole basis for treatment  or other patient management decisions.  A negative result may occur with improper specimen collection / handling, submission of specimen other than nasopharyngeal swab, presence of viral mutation(s) within the areas targeted by this assay, and inadequate number of viral copies (<250 copies / mL). A negative result must be combined with clinical observations, patient history, and epidemiological information.  Fact Sheet for Patients:   RoadLapTop.co.za  Fact Sheet for Healthcare Providers: http://kim-miller.com/  This test is not yet approved or  cleared by the [place] FDA and has been authorized for detection and/or diagnosis of SARS-CoV-2 by FDA under an Emergency Use Authorization (EUA).  This EUA will remain in effect (meaning this test can be used) for the duration of the COVID-19 declaration under Section 564(b)(1) of the Act, 21 U.S.C. section 360bbb-3(b)(1), unless the authorization is terminated or revoked sooner.  Performed at [facility], [address] [place], [place] [address]     Blood Alcohol level:  Lab Results  Component Value Date   [facility] <10 11/03/2023   ETH <10 10/15/2023    Metabolic Disorder Labs:  Lab Results  Component Value Date   HGBA1C 5.9 (H) 05/03/2023   MPG 123 05/03/2023   No results found for:  "PROLACTIN" Lab Results  Component Value Date   CHOL 149 05/03/2023   TRIG 66 05/03/2023   HDL 49 05/03/2023   CHOLHDL 3.0 05/03/2023   VLDL 13 05/03/2023   LDLCALC 87 05/03/2023    Current Medications: Current Facility-Administered Medications  Medication Dose Route Frequency Provider Last Rate Last Admin   acetaminophen (TYLENOL) tablet 650 mg  650 mg Oral Q6H PRN Saucier, Jerlyn Ly, NP   650 mg at 11/04/23 0815   albuterol (PROVENTIL) (2.5 MG/3ML) 0.083% nebulizer solution 3 mL  3 mL Inhalation Q2H PRN Verner Chol, MD       alum & mag hydroxide-simeth (MAALOX/MYLANTA) 200-200-20 MG/5ML suspension 30 mL  30 mL Oral Q4H PRN Saucier, Jerlyn Ly, NP       aspirin EC tablet 81 mg  81 mg Oral Daily Verner Chol, MD   81 mg at 11/04/23 1124   atorvastatin (LIPITOR) tablet 40 mg  40 mg Oral Daily Verner Chol, MD   40 mg at 11/04/23 1123   brimonidine (ALPHAGAN) 0.2 % ophthalmic solution 1 drop  1 drop Left Eye QHS Verner Chol, MD       brinzolamide (AZOPT) 1 % ophthalmic suspension 1 drop  1 drop Left Eye QHS Verner Chol, MD       busPIRone (BUSPAR) tablet 10 mg  10 mg Oral BID Verner Chol, MD   10 mg at 11/04/23 1124   clopidogrel (PLAVIX) tablet 75 mg  75 mg Oral Daily Verner Chol, MD   75 mg at 11/04/23 1124   docusate sodium (COLACE) capsule 100 mg  100 mg Oral BID Verner Chol, MD   100 mg at 11/04/23 1124   escitalopram (LEXAPRO) tablet 20 mg  20 mg Oral QHS Verner Chol, MD       feeding supplement (ENSURE ENLIVE / ENSURE PLUS) liquid 237 mL  237 mL Oral BID BM Verner Chol, MD   237 mL at 11/04/23 1451   ferrous sulfate tablet 325 mg  325 mg Oral Alford Highland, MD   325 mg at 11/04/23 1124   fluticasone (FLONASE) 50 MCG/ACT nasal spray 1 spray  1 spray Each Nare Daily Verner Chol, MD   1 spray at 11/04/23 1124   gabapentin (NEURONTIN) capsule 100  mg  100 mg Oral Daily Verner Chol, MD   100 mg at 11/04/23 1123   gabapentin  (NEURONTIN) capsule 200 mg  200 mg Oral QHS Verner Chol, MD       hydrOXYzine (ATARAX) tablet 25 mg  25 mg Oral TID PRN Saucier, Jerlyn Ly, NP   25 mg at 11/04/23 0815   latanoprost (XALATAN) 0.005 % ophthalmic solution 1 drop  1 drop Left Eye QHS Verner Chol, MD       magnesium hydroxide (MILK OF MAGNESIA) suspension 30 mL  30 mL Oral Daily PRN Saucier, Jerlyn Ly, NP       meclizine (ANTIVERT) tablet 25 mg  25 mg Oral TID PRN Verner Chol, MD       nicotine (NICODERM CQ - dosed in mg/24 hours) patch 14 mg  14 mg Transdermal Daily Verner Chol, MD   14 mg at 11/04/23 0817   OLANZapine (ZYPREXA) injection 5 mg  5 mg Intramuscular TID PRN Saucier, Jerlyn Ly, NP       OLANZapine zydis (ZYPREXA) disintegrating tablet 5 mg  5 mg Oral TID PRN Saucier, Jerlyn Ly, NP       ondansetron (ZOFRAN-ODT) disintegrating tablet 4 mg  4 mg Oral Q8H PRN Verner Chol, MD       Melene Muller ON 11/05/2023] pantoprazole (PROTONIX) EC tablet 40 mg  40 mg Oral QAC breakfast Verner Chol, MD       Plecanatide TABS 3 mg  1 tablet Oral Daily Verner Chol, MD       senna-docusate (Senokot-S) tablet 2 tablet  2 tablet Oral BID Verner Chol, MD   2 tablet at 11/04/23 1124   traZODone (DESYREL) tablet 50 mg  50 mg Oral QHS Verner Chol, MD       PTA Medications: Medications Prior to Admission  Medication Sig Dispense Refill Last Dose/Taking   albuterol (PROVENTIL HFA;VENTOLIN HFA) 108 (90 Base) MCG/ACT inhaler Inhale 2 puffs into the lungs every 2 (two) hours as needed for wheezing or shortness of breath (cough). 1 Inhaler 3    albuterol (PROVENTIL) (2.5 MG/3ML) 0.083% nebulizer solution Take 3 mLs (2.5 mg total) by nebulization every 6 (six) hours as needed for wheezing or shortness of breath. 75 mL 12    aspirin EC 81 MG tablet Take 1 tablet (81 mg total) by mouth daily. Swallow whole. 90 each 12    atorvastatin (LIPITOR) 40 MG tablet Take 1 tablet (40 mg total) by mouth daily. 30 tablet 0     brimonidine (ALPHAGAN) 0.2 % ophthalmic solution Place 1 drop into the left eye at bedtime. 20 each 12    brinzolamide (AZOPT) 1 % ophthalmic suspension Place 1 drop into the left eye at bedtime. 15 each 12    busPIRone (BUSPAR) 10 MG tablet Take 1 tablet (10 mg total) by mouth 2 (two) times daily. 60 tablet 0    clopidogrel (PLAVIX) 75 MG tablet Take 1 tablet (75 mg total) by mouth daily. 30 tablet 0    docusate sodium (COLACE) 100 MG capsule Take 1 capsule (100 mg total) by mouth 2 (two) times daily. (Patient not taking: Reported on 10/28/2023) 30 capsule 0    escitalopram (LEXAPRO) 20 MG tablet Take 1 tablet (20 mg total) by mouth at bedtime. 30 tablet 0    feeding supplement (ENSURE ENLIVE / ENSURE PLUS) LIQD Take 237 mLs by mouth 2 (two) times daily between meals. (Patient not taking: Reported on 10/28/2023) 237 mL 12    ferrous sulfate 325 (  65 FE) MG tablet Take 1 tablet (325 mg total) by mouth every other day. 45 tablet 0    fluticasone (FLONASE) 50 MCG/ACT nasal spray Place 1 spray into both nostrils daily. 1 g 0    gabapentin (NEURONTIN) 100 MG capsule Take 1 capsule (100 mg total) by mouth daily. 30 capsule 0    gabapentin (NEURONTIN) 100 MG capsule Take 2 capsules (200 mg total) by mouth at bedtime. 60 capsule 0    latanoprost (XALATAN) 0.005 % ophthalmic solution Place 1 drop into the left eye at bedtime. 10 each 12    loratadine (CLARITIN) 10 MG tablet Take 10 mg by mouth daily.      meclizine (ANTIVERT) 25 MG tablet Take 1 tablet (25 mg total) by mouth 3 (three) times daily as needed for dizziness. (Patient not taking: Reported on 10/28/2023) 30 tablet 0    montelukast (SINGULAIR) 10 MG tablet Take 1 tablet (10 mg total) by mouth daily. 30 tablet 0    ondansetron (ZOFRAN-ODT) 4 MG disintegrating tablet 4mg  ODT q4 hours prn nausea/vomit 12 tablet 0    pantoprazole (PROTONIX) 40 MG tablet Take 1 tablet (40 mg total) by mouth daily before breakfast. 15 tablet 0    Plecanatide (TRULANCE) 3 MG  TABS Take 1 tablet by mouth daily. 30 tablet 6    senna-docusate (SENOKOT-S) 8.6-50 MG tablet Take 2 tablets by mouth 2 (two) times daily. 30 tablet 0    sodium chloride (OCEAN) 0.65 % nasal spray Place into the nose.      traZODone (DESYREL) 50 MG tablet Take 1 tablet (50 mg total) by mouth at bedtime. 30 tablet 0     Psychiatric Specialty Exam:  Presentation  General Appearance:  Appropriate for Environment; Casual  Eye Contact: Minimal  Speech: Clear and Coherent  Speech Volume: Decreased    Mood and Affect  Mood: Depressed; Hopeless  Affect: Depressed; Flat   Thought Process  Thought Processes: Coherent  Descriptions of Associations:Intact  Orientation:Full (Time, Place and Person)  Thought Content:Illogical; Rumination  Hallucinations:Hallucinations: None  Ideas of Reference:None  Suicidal Thoughts:Suicidal Thoughts: Yes, Active SI Active Intent and/or Plan: With Means to Carry Out; With Plan; With Intent  Homicidal Thoughts:Homicidal Thoughts: No   Sensorium  Memory: Immediate Fair; Recent Fair; Remote Poor  Judgment: Impaired  Insight: Shallow   Executive Functions  Concentration: Poor  Attention Span: Poor  Recall: [employer] of Knowledge: Fair  Language: Fair   Psychomotor Activity  Psychomotor Activity: Psychomotor Activity: Normal   Assets  Assets: Communication Skills; Desire for Improvement; Resilience    Musculoskeletal: Strength & Muscle Tone: within normal limits Gait & Station: normal  Physical Exam: Physical Exam Vitals and nursing note reviewed.  HENT:     Head: Normocephalic.     Right Ear: Tympanic membrane normal.     Nose: Nose normal.     Mouth/Throat:     Mouth: Mucous membranes are moist.  Eyes:     Pupils: Pupils are equal, round, and reactive to light.  Cardiovascular:     Rate and Rhythm: Normal rate.  Pulmonary:     Breath sounds: Normal breath sounds.  Abdominal:     General:  Bowel sounds are normal.  Musculoskeletal:        General: Normal range of motion.  Skin:    General: Skin is warm.  Neurological:     General: No focal deficit present.     Mental Status: She is alert.    Review  of Systems  Constitutional: Negative.   HENT: Negative.    Eyes: Negative.   Respiratory: Negative.    Cardiovascular: Negative.   Gastrointestinal: Negative.   Genitourinary: Negative.   Skin: Negative.    Blood pressure (!) 141/70, pulse 72, temperature 98.2 F (36.8 C), resp. rate 18, height 5\' 3"  (1.6 m), weight 45.6 kg, SpO2 99%. Body mass index is 17.8 kg/m.  Principal Diagnosis: Suicidal ideation Diagnosis:  Principal Problem:   Suicidal ideation Active Problems:   MDD (major depressive disorder), recurrent severe, without psychosis (HCC)   Clinical Decision Making: Patient with history of depression and anxiety with recent inpatient psychiatric hospitalization few weeks ago, readmitted to the inpatient psychiatric unit after a suicide attempt with overdose on her medications with intent to die.  Patient reports multiple psychosocial stressors that includes living with her son who creates a hostile environment in the house and patient unable to handle her daily activities, taking care of 7 dogs with no help, worsening her depression and suicidal thoughts.  She needs inpatient hospitalization for further stabilization  Treatment Plan Summary:  Safety and Monitoring: Will consider initiating IVC if patient wants to leave AMA             -- Voluntary admission to inpatient psychiatric unit for safety, stabilization and treatment             -- Daily contact with patient to assess and evaluate symptoms and progress in treatment             -- Patient's case to be discussed in multi-disciplinary team meeting             -- Observation Level: q15 minute checks             -- Vital signs:  q12 hours             -- Precautions: suicide, elopement, and assault   2.  Psychiatric Diagnoses and Treatment:               Restarted her home medications-boost for 10 mg twice daily, Lexapro 20 mg gabapentin 100 in the morning and 200 at night Will consider adding Abilify 2 mg nightly to boost up the antidepressant effect   -- The risks/benefits/side-effects/alternatives to this medication were discussed in detail with the patient and time was given for questions. The patient consents to medication trial.                -- Metabolic profile and EKG monitoring obtained while on an atypical antipsychotic (BMI: Lipid Panel: HbgA1c: QTc:)              -- Encouraged patient to participate in unit milieu and in scheduled group therapies                            3. Medical Issues Being Addressed:      4. Discharge Planning:              -- Social work and case management to assist with discharge planning and identification of hospital follow-up needs prior to discharge             -- Estimated LOS: 5-7 days             -- Discharge Concerns: Need to establish a safety plan; Medication compliance and effectiveness             -- Discharge Goals: Return home  with outpatient referrals follow ups  Physician Treatment Plan for Primary Diagnosis: Suicidal ideation Long Term Goal(s): Improvement in symptoms so as ready for discharge  Short Term Goals: Ability to identify changes in lifestyle to reduce recurrence of condition will improve, Ability to verbalize feelings will improve, Ability to disclose and discuss suicidal ideas, Ability to demonstrate self-control will improve, Ability to maintain clinical measurements within normal limits will improve, and Compliance with prescribed medications will improve  Physician Treatment Plan for Secondary Diagnosis: Principal Problem:   Suicidal ideation Active Problems:   MDD (major depressive disorder), recurrent severe, without psychosis (HCC)  Long Term Goal(s): Improvement in symptoms so as ready for discharge  Short  Term Goals: Ability to identify changes in lifestyle to reduce recurrence of condition will improve, Ability to verbalize feelings will improve, Ability to disclose and discuss suicidal ideas, Ability to demonstrate self-control will improve, Ability to identify and develop effective coping behaviors will improve, Ability to maintain clinical measurements within normal limits will improve, Compliance with prescribed medications will improve, and Ability to identify triggers associated with substance abuse/mental health issues will improve  I certify that inpatient services furnished can reasonably be expected to improve the patient's condition.    Verner Chol, MD 2/13/20253:44 PM

## 2023-11-04 NOTE — Group Note (Signed)
Date:  11/04/2023 Time:  10:01 PM  Group Topic/Focus:  Self Care:   The focus of this group is to help patients understand the importance of self-care in order to improve or restore emotional, physical, spiritual, interpersonal, and financial health.    Participation Level:  Active  Participation Quality:  Appropriate  Affect:  Appropriate  Cognitive:  Appropriate  Insight: Appropriate  Engagement in Group:  Engaged  Modes of Intervention:  Discussion  Additional Comments:    Julia Osborne 11/04/2023, 10:01 PM

## 2023-11-04 NOTE — Plan of Care (Signed)
D: Pt alert and oriented. Pt reports experiencing anxiety/depression at this time. Pt reports experiencing Left foot pain at this time, prn medication given. Pt denies/reports experiencing any SI/HI, or AVH at this time.   A: Scheduled medications administered to pt, per MD orders. Support and encouragement provided. Frequent verbal contact made. Routine safety checks conducted q15 minutes.   R: No adverse drug reactions noted. Pt verbally contracts for safety at this time. Pt compliant with medications and treatment plan. Pt interacts well with others on the unit. Pt remains safe at this time. Plan of care ongoing.  Problem: Education: Goal: Emotional status will improve Outcome: Not Progressing   Problem: Activity: Goal: Interest or engagement in activities will improve Outcome: Progressing

## 2023-11-04 NOTE — Progress Notes (Signed)
   11/03/23 2120  Psych Admission Type (Psych Patients Only)  Admission Status Voluntary  Psychosocial Assessment  Patient Complaints Sadness  Eye Contact Brief  Facial Expression Flat  Affect Sad  Speech Slow  Interaction Assertive  Motor Activity Slow  Appearance/Hygiene Unremarkable  Behavior Characteristics Cooperative;Guarded  Mood Depressed  Aggressive Behavior  Effect No apparent injury  Thought Process  Coherency Disorganized  Content Blaming others;Blaming self  Delusions None reported or observed  Perception WDL  Hallucination None reported or observed  Judgment Impaired  Confusion WDL  Danger to Self  Current suicidal ideation? Denies  Self-Injurious Behavior No self-injurious ideation or behavior indicators observed or expressed   Agreement Not to Harm Self Yes  Description of Agreement Verbal  Danger to Others  Danger to Others None reported or observed

## 2023-11-04 NOTE — Plan of Care (Signed)
Problem: Education: Goal: Emotional status will improve Outcome: Progressing Goal: Mental status will improve Outcome: Progressing

## 2023-11-04 NOTE — Group Note (Signed)
Recreation Therapy Group Note   Group Topic:Problem Solving  Group Date: 11/04/2023 Start Time: 1400 End Time: 1450 Facilitators: Rosina Lowenstein, LRT, CTRS Location:  Dayroom  Group Description: Life Boat. Patients were given the scenario that they are on a boat that is about to become shipwrecked, leaving them stranded on an [place]. They are asked to make a list of 15 different items that they want to take with them when they are stranded on the [place]. Patients are asked to rank their items from most important to least important, #1 being the most important and #15 being the least. Patients will work individually for the first round to come up with 15 items and then pair up with a peer(s) to condense their list and come up with one list of 15 items between the two of them. Patients or LRT will read aloud the 15 different items to the group after each round. LRT facilitated post-activity processing to discuss how this activity can be used in daily life post discharge.   Goal Area(s) Addressed:  Patient will identify priorities, wants and needs. Patient will communicate with LRT and peers. Patient will work collectively as a Administrator, Civil Service. Patient will work on Product manager.    Affect/Mood: Appropriate   Participation Level: Active and Engaged   Participation Quality: Independent   Behavior: Appropriate, Calm, and Cooperative   Speech/Thought Process: Coherent   Insight: Good   Judgement: Good   Modes of Intervention: Group work, Guided Discussion, Education administrator, Problem-solving, Dance movement psychotherapist, and Team-building   Patient Response to Interventions:  Attentive, Engaged, Interested , and Receptive   Education Outcome:  Acknowledges education   Clinical Observations/Individualized Feedback: Hiya was active in their participation of session activities and group discussion. Pt identified "wood, mattress, medication, cup, blanket and toilet paper" as some of the items  she will bring with her. Pt volunteered to be the group Clinical research associate. Pt interacted well with LRT and peers duration of session.    Plan: Continue to engage patient in RT group sessions 2-3x/week.   Rosina Lowenstein, LRT, CTRS 11/04/2023 5:00 PM

## 2023-11-04 NOTE — Group Note (Signed)
Date:  11/04/2023 Time:  9:07 AM  Group Topic/Focus:  Managing Feelings:   The focus of this group is to identify what feelings patients have difficulty handling and develop a plan to handle them in a healthier way upon discharge.    Participation Level:  Active  Participation Quality:  Appropriate, Attentive, Sharing, and Supportive  Affect:  Appropriate  Cognitive:  Alert, Appropriate, and Oriented  Insight: Appropriate and Good  Engagement in Group:  Engaged, Improving, and Supportive  Modes of Intervention:  Exploration, Socialization, and Support  Additional Comments:     [name] 11/04/2023, 9:07 AM

## 2023-11-04 NOTE — Progress Notes (Signed)
   11/04/23 0600  15 Minute Checks  Location Bedroom  Visual Appearance Calm  Behavior Sleeping  Sleep (Behavioral Health Patients Only)  Calculate sleep? (Click Yes once per 24 hr at 0600 safety check) Yes  Documented sleep last 24 hours 8

## 2023-11-04 NOTE — [facility] Suicide Risk Assessment (Signed)
[facility] Admission Suicide Risk Assessment   Nursing information obtained from:  Patient Demographic factors:  Low socioeconomic status, Unemployed, Caucasian, Divorced or widowed Current Mental Status:  Suicidal ideation indicated by patient Loss Factors:  Financial problems / change in socioeconomic status Historical Factors:  NA Risk Reduction Factors:  Sense of responsibility to family  Total Time spent with patient: 30 minutes Principal Problem: Suicidal ideation Diagnosis:  Principal Problem:   Suicidal ideation Active Problems:   MDD (major depressive disorder), recurrent severe, without psychosis (HCC)  Subjective Data: Julia Osborne is a 63 y.o. female admitted: Presented to the EDfor 11/03/2023  1:44 PM for suicidal attempt. She carries the psychiatric diagnoses of suicidal ideation, major depressive disorder, PTSD, GAD and has a past medical history of COPD, GERD, colon neoplasm, endometrial cancer, diverticulitis, Lynch syndrome.   Continued Clinical Symptoms:  Alcohol Use Disorder Identification Test Final Score (AUDIT): 1 The "Alcohol Use Disorders Identification Test", Guidelines for Use in Primary Care, Second Edition.  World Science writer [facility]). Score between 0-7:  no or low risk or alcohol related problems. Score between 8-15:  moderate risk of alcohol related problems. Score between 16-19:  high risk of alcohol related problems. Score 20 or above:  warrants further diagnostic evaluation for alcohol dependence and treatment.   CLINICAL FACTORS:   Depression:   Anhedonia Hopelessness Impulsivity Previous Psychiatric Diagnoses and Treatments   Musculoskeletal: Strength & Muscle Tone: within normal limits Gait & Station: normal Patient leans: N/A  Psychiatric Specialty Exam:  Presentation  General Appearance:  Appropriate for Environment; Casual  Eye Contact: Minimal  Speech: Clear and Coherent  Speech  Volume: Decreased  Handedness: Right   Mood and Affect  Mood: Depressed; Hopeless  Affect: Depressed; Flat   Thought Process  Thought Processes: Coherent  Descriptions of Associations:Intact  Orientation:Full (Time, Place and Person)  Thought Content:Illogical; Rumination  History of Schizophrenia/Schizoaffective disorder:No  Duration of Psychotic Symptoms:N/A  Hallucinations:Hallucinations: None  Ideas of Reference:None  Suicidal Thoughts:Suicidal Thoughts: Yes, Active SI Active Intent and/or Plan: With Means to Carry Out; With Plan; With Intent  Homicidal Thoughts:Homicidal Thoughts: No   Sensorium  Memory: Immediate Fair; Recent Fair; Remote Poor  Judgment: Impaired  Insight: Shallow   Executive Functions  Concentration: Poor  Attention Span: Poor  Recall: [employer] of Knowledge: Fair  Language: Fair   Psychomotor Activity  Psychomotor Activity: Psychomotor Activity: Normal   Assets  Assets: Communication Skills; Desire for Improvement; Resilience   Sleep  Sleep: Sleep: Poor    Physical Exam: Physical Exam ROS Blood pressure (!) 141/70, pulse 72, temperature 98.2 F (36.8 C), resp. rate 18, height 5\' 3"  (1.6 m), weight 45.6 kg, SpO2 99%. Body mass index is 17.8 kg/m.   COGNITIVE FEATURES THAT CONTRIBUTE TO RISK:  Thought constriction (tunnel vision)    SUICIDE RISK:   Severe:  Frequent, intense, and enduring suicidal ideation, specific plan, no subjective intent, but some objective markers of intent (i.e., choice of lethal method), the method is accessible, some limited preparatory behavior, evidence of impaired self-control, severe dysphoria/symptomatology, multiple risk factors present, and few if any protective factors, particularly a lack of social support.  PLAN OF CARE: Patient is admitted to the inpatient geropsych unit with every 15 safety checks.  Multidisciplinary team approach would be offered.  Medication  management, milieu/group therapy will be offered  I certify that inpatient services furnished can reasonably be expected to improve the patient's condition.   Verner Chol, MD 11/04/2023, 3:42 PM

## 2023-11-05 DIAGNOSIS — R45851 Suicidal ideations: Secondary | ICD-10-CM

## 2023-11-05 MED ORDER — SENNOSIDES-DOCUSATE SODIUM 8.6-50 MG PO TABS
2.0000 | ORAL_TABLET | Freq: Every day | ORAL | Status: DC | PRN
Start: 2023-11-05 — End: 2023-11-13

## 2023-11-05 MED ORDER — DOCUSATE SODIUM 100 MG PO CAPS: 100.0000 mg | ORAL_CAPSULE | Freq: Every day | ORAL | Status: DC | PRN

## 2023-11-05 NOTE — Progress Notes (Signed)
[facility] MD Progress Note  11/05/2023 6:58 PM Julia Osborne  MRN:  [id number]  Julia Osborne is a 63 y.o. female admitted: Presented to the EDfor 11/03/2023  1:44 PM for suicidal attempt. She carries the psychiatric diagnoses of suicidal ideation, major depressive disorder, PTSD, GAD and has a past medical history of COPD, GERD, colon neoplasm, endometrial cancer, diverticulitis, Lynch syndrome.  Subjective:  Chart reviewed, case discussed in multidisciplinary meeting, patient seen during rounds.  Patient is seen today in the treatment team.  Patient continues to report feeling depressed and hopeless given her social situation of unable to take care of herself and dogs and deal with the anger issues of her son.  She reports that she is having hard time meeting the finances of paying the current bill, water bill and her son using up her food stamps.  She reports feeling hopeless and is unable to tell her son to leave the house .  Provider and team emphasized her home situation being the trigger for her depression.  Patient did acknowledge that her home situation with her son and unable to take care of the dogs led up to the suicide attempt.  Patient displays no insight into the further steps that she needs to take in discussing with her son about moving out and trying to reach out to the local animal shelter to handle her dogs.  She reports that she has to get back home to feed her dogs.  She continues to make passive statements of unable to handle life.  On the unit she is taking her medications with no reported side effects.   Sleep: Fair  Appetite:  Fair  Past Psychiatric History: see h&P Family History:  Family History  Problem Relation Age of Onset   Depression Mother    Other Mother        clot that went to heart, deceased age 72ss   Heart attack Father        age 54s, deceased   Stroke Father    Cancer Father        "at death determined he was ate up with cancer"   Hypertension Sister     Kidney cancer Sister 65   Diabetes Sister    Diabetes Brother    Hypertension Brother    Brain cancer Paternal Aunt    Cancer Paternal Uncle        NOS   Cancer Paternal Uncle        NOS   Cancer Paternal Uncle        NOS   Heart attack Maternal Grandfather    Endometriosis Daughter    Alcohol abuse Son    Drug abuse Son    Colon cancer Neg Hx    Social History:  Social History   Substance and Sexual Activity  Alcohol Use No     Social History   Substance and Sexual Activity  Drug Use No    Social History   Socioeconomic History   Marital status: Widowed    Spouse name: Not on file   Number of children: 2   Years of education: Not on file   Highest education level: Associate degree: occupational, Scientist, product/process development, or vocational program  Occupational History   Not on file  Tobacco Use   Smoking status: Never   Smokeless tobacco: Never  Vaping Use   Vaping status: Never Used  Substance and Sexual Activity   Alcohol use: No   Drug use: No   Sexual  activity: Not Currently    Birth control/protection: Surgical    Comment: hyst  Other Topics Concern   Not on file  Social History Narrative   ** Merged History Encounter **       ** Data from: 04/28/23 Enc Dept: [facility]-PRE ADM TESTING   #  [place]; smoking- 1/2 ppd; no alcohol; used to run machines;   FHx-Dad- MI; ? Cancer on autopsy; sisters/aunts- brain; lung cancer x2; oldest sister- kidney cancer  Pt has 63 year old son;daughter 33 years. Brothers- states to have s   poken to her family re: importance of them being checked for lynch.        ** Data from: 05/04/23 Enc Dept: [facility]   Lives alone with 4 large dogs.    Social Drivers of Health   Financial Resource Strain: Medium Risk (12/07/2022)   Overall Financial Resource Strain (CARDIA)    Difficulty of Paying Living Expenses: Somewhat hard  Food Insecurity: Food Insecurity Present (11/03/2023)   Hunger Vital Sign    Worried About Running  Out of Food in the Last Year: Sometimes true    Ran Out of Food in the Last Year: Sometimes true  Transportation Needs: Unmet Transportation Needs (11/03/2023)   PRAPARE - Transportation    Lack of Transportation (Medical): Yes    Lack of Transportation (Non-Medical): Yes  Physical Activity: Inactive (12/07/2022)   Exercise Vital Sign    Days of Exercise per Week: 0 days    Minutes of Exercise per Session: 0 min  Stress: Stress Concern Present (12/07/2022)   [employer] of Occupational Health - Occupational Stress Questionnaire    Feeling of Stress : To some extent  Social Connections: Socially Isolated (11/03/2023)   Social Connection and Isolation Panel [NHANES]    Frequency of Communication with Friends and Family: More than three times a week    Frequency of Social Gatherings with Friends and Family: Once a week    Attends Religious Services: Never    Database administrator or Organizations: No    Attends Banker Meetings: Never    Marital Status: Widowed   Past Medical History:  Past Medical History:  Diagnosis Date   Abdominal pain, acute, left upper quadrant    Abdominal pain, epigastric    Abnormal CT scan, sigmoid colon    Abnormal vaginal Pap smear    Adenocarcinoma of colon (HCC) 09/13/2015   Partial colon resection and chemo tx's.    Anemia    Anxiety    Anxiety disorder, unspecified    Aphasia    Asthma    Cancer (HCC)    endometrial; cancer cells in intestine   Chronic pain of left knee    Colon neoplasm    Constipation    Conversion reaction    COPD (chronic obstructive pulmonary disease) (HCC)    no definite diagnosis   Depression    Diverticulitis    Diverticulosis of colon without hemorrhage    Dyspareunia 05/16/2015   Dysphasia    Endometrial cancer (HCC)    Esophageal dysphagia    Family history of cancer    Family history of kidney cancer    Ganglion of left knee    Gastric intestinal metaplasia    GERD (gastroesophageal  reflux disease)    Glaucoma    Hematochezia    History of colonic polyps    Hyperlipidemia    Indigestion    Lynch syndrome    Mucosal abnormality of stomach  Nausea and vomiting    Neuropathy    feet and hands   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 drug (HCC)    PONV (postoperative nausea and vomiting)    Pre-diabetes    Primary osteoarthritis of left knee    Rectal bleeding    Reflux esophagitis    Soft tissue swelling of knee joint    Uterine fibroid    UTI (urinary tract infection) 04/26/2023   on cipro   Vaginal dryness 05/16/2015   Vaginal itching    Vaginal Pap smear, abnormal     Past Surgical History:  Procedure Laterality Date   ABDOMINAL HYSTERECTOMY     APPENDECTOMY     BIOPSY N/A 03/14/2015   Procedure: BIOPSY;  Surgeon: Corbin Ade, MD;  Location: [facility];  Service: Endoscopy;  Laterality: N/A;  Gastric   COLONOSCOPY N/A 01/28/2017   Procedure: COLONOSCOPY;  Surgeon: Corbin Ade, MD;  Location: [facility];  Service: Endoscopy;  Laterality: N/A;  11:30am   COLONOSCOPY WITH PROPOFOL N/A 03/14/2015   RMR: Internal hemorrhoids. colonic diverticulosis. Incomplete examination. Prepartation inadequate.   COLONOSCOPY WITH PROPOFOL N/A 07/04/2015   RMR: Colonic diverticulosis . Large polypoid lesion in the vicinity of the hepatic flexure status post saline-assisted piecmeal snare polypectomy  with ablation and tattooing as described. Sigmoid polyp removed as described above. sigmoid colon polyp hyperplastic, hepatic flexure polyp with TA with focal high grade dysplasia    COLONOSCOPY WITH PROPOFOL N/A 11/03/2018   Procedure: COLONOSCOPY WITH PROPOFOL;  Surgeon: Wyline Mood, MD;  Location: [facility] ENDOSCOPY;  Service: Gastroenterology;  Laterality: N/A;   COLONOSCOPY WITH PROPOFOL N/A 12/09/2020   Procedure: COLONOSCOPY WITH PROPOFOL;  Surgeon: Wyline Mood, MD;  Location: [facility] ENDOSCOPY;  Service: Gastroenterology;  Laterality: N/A;  COVID POSITIVE 10/30/2020    COLONOSCOPY WITH PROPOFOL N/A 01/20/2021   Procedure: COLONOSCOPY WITH PROPOFOL;  Surgeon: Wyline Mood, MD;  Location: [facility] ENDOSCOPY;  Service: Gastroenterology;  Laterality: N/A;   COLONOSCOPY WITH PROPOFOL N/A 12/10/2021   Procedure: COLONOSCOPY WITH PROPOFOL;  Surgeon: Wyline Mood, MD;  Location: [facility] ENDOSCOPY;  Service: Gastroenterology;  Laterality: N/A;   COLONOSCOPY WITH PROPOFOL N/A 02/16/2023   Procedure: COLONOSCOPY WITH PROPOFOL;  Surgeon: Wyline Mood, MD;  Location: [facility] ENDOSCOPY;  Service: Gastroenterology;  Laterality: N/A;   ENTEROSCOPY N/A 02/12/2020   Procedure: ENTEROSCOPY;  Surgeon: Wyline Mood, MD;  Location: [facility] ENDOSCOPY;  Service: Gastroenterology;  Laterality: N/A;   ESOPHAGEAL DILATION N/A 03/14/2015   Procedure: ESOPHAGEAL DILATION;  Surgeon: Corbin Ade, MD;  Location: [facility];  Service: Endoscopy;  Laterality: N/AElease Hashimoto 54   ESOPHAGOGASTRODUODENOSCOPY N/A 05/19/2016   Procedure: ESOPHAGOGASTRODUODENOSCOPY (EGD);  Surgeon: Corbin Ade, MD;  [facility];  Service: Endoscopy;  Laterality: N/A;  215   ESOPHAGOGASTRODUODENOSCOPY N/A 01/28/2017   Procedure: ESOPHAGOGASTRODUODENOSCOPY (EGD);  Surgeon: Corbin Ade, MD;  [facility];  Service: Endoscopy;  Laterality: N/A;   ESOPHAGOGASTRODUODENOSCOPY N/A 12/10/2021   Procedure: ESOPHAGOGASTRODUODENOSCOPY (EGD);  Surgeon: Wyline Mood, MD;  Location: [facility] ENDOSCOPY;  Service: Gastroenterology;  Laterality: N/A;   ESOPHAGOGASTRODUODENOSCOPY (EGD) WITH PROPOFOL N/A 03/14/2015   RMR: Mild erosive reflux esophagitis status post passage o f a Maloney dilator. Abnormal gastric mucosa of uncertain significance as described above. status post biopsy, benign   ESOPHAGOGASTRODUODENOSCOPY (EGD) WITH PROPOFOL N/A 11/18/2017   Procedure: ESOPHAGOGASTRODUODENOSCOPY (EGD) WITH PROPOFOL;  Surgeon: Corbin Ade, MD;  [facility];  Service: Endoscopy;  Laterality: N/A;  12:15pm    ESOPHAGOGASTRODUODENOSCOPY (EGD)  WITH PROPOFOL N/A 11/03/2018   Procedure: ESOPHAGOGASTRODUODENOSCOPY (EGD) WITH PROPOFOL;  Surgeon: Wyline Mood, MD;  Location: [facility] ENDOSCOPY;  Service: Gastroenterology;  Laterality: N/A;   ESOPHAGOGASTRODUODENOSCOPY (EGD) WITH PROPOFOL N/A 02/16/2023   Procedure: ESOPHAGOGASTRODUODENOSCOPY (EGD) WITH PROPOFOL;  Surgeon: Wyline Mood, MD;  Location: [facility] ENDOSCOPY;  Service: Gastroenterology;  Laterality: N/A;   GIVENS CAPSULE STUDY N/A 12/25/2019   Procedure: GIVENS CAPSULE STUDY;  Surgeon: Wyline Mood, MD;  Location: [facility] ENDOSCOPY;  Service: Gastroenterology;  Laterality: N/A;   MALONEY DILATION N/A 05/19/2016   Procedure: [name] DILATION;  Surgeon: Corbin Ade, MD;  [facility];  Service: Endoscopy;  Laterality: N/A;   MALONEY DILATION N/A 01/28/2017   Procedure: [name] DILATION;  Surgeon: Corbin Ade, MD;  [facility];  Service: Endoscopy;  Laterality: N/A;   MALONEY DILATION N/A 11/18/2017   Procedure: [name] DILATION;  Surgeon: Corbin Ade, MD;  [facility];  Service: Endoscopy;  Laterality: N/A;   PARTIAL COLECTOMY  08/30/2015   polyp with adenocarcinoma   PARTIAL COLECTOMY  08/29/2018   POLYPECTOMY N/A 07/04/2015   Procedure: POLYPECTOMY;  Surgeon: Corbin Ade, MD;  Location: [facility];  Service: Endoscopy;  Laterality: N/A;   PORTACATH PLACEMENT Right 09/21/2014   TUBAL LIGATION      Current Medications: Current Facility-Administered Medications  Medication Dose Route Frequency Provider Last Rate Last Admin   acetaminophen (TYLENOL) tablet 650 mg  650 mg Oral Q6H PRN Saucier, Jerlyn Ly, NP   650 mg at 11/05/23 0940   albuterol (PROVENTIL) (2.5 MG/3ML) 0.083% nebulizer solution 3 mL  3 mL Inhalation Q2H PRN Verner Chol, MD       alum & mag hydroxide-simeth (MAALOX/MYLANTA) 200-200-20 MG/5ML suspension 30 mL  30 mL Oral Q4H PRN Saucier, Jerlyn Ly, NP       aspirin EC tablet 81 mg  81 mg  Oral Daily Verner Chol, MD   81 mg at 11/05/23 0925   atorvastatin (LIPITOR) tablet 40 mg  40 mg Oral Daily Verner Chol, MD   40 mg at 11/05/23 0926   brimonidine (ALPHAGAN) 0.2 % ophthalmic solution 1 drop  1 drop Left Eye QHS Verner Chol, MD   1 drop at 11/04/23 2224   brinzolamide (AZOPT) 1 % ophthalmic suspension 1 drop  1 drop Left Eye QHS Verner Chol, MD   1 drop at 11/04/23 2223   busPIRone (BUSPAR) tablet 10 mg  10 mg Oral BID Verner Chol, MD   10 mg at 11/05/23 1610   clopidogrel (PLAVIX) tablet 75 mg  75 mg Oral Daily Verner Chol, MD   75 mg at 11/05/23 9604   docusate sodium (COLACE) capsule 100 mg  100 mg Oral Daily PRN Verner Chol, MD       escitalopram (LEXAPRO) tablet 20 mg  20 mg Oral QHS Verner Chol, MD   20 mg at 11/04/23 2219   feeding supplement (ENSURE ENLIVE / ENSURE PLUS) liquid 237 mL  237 mL Oral BID BM Verner Chol, MD   237 mL at 11/05/23 5409   ferrous sulfate tablet 325 mg  325 mg Oral Alford Highland, MD   325 mg at 11/04/23 1124   fluticasone (FLONASE) 50 MCG/ACT nasal spray 1 spray  1 spray Each Nare Daily Verner Chol, MD   1 spray at 11/05/23 8119   gabapentin (NEURONTIN) capsule 100 mg  100 mg Oral Daily Verner Chol, MD   100 mg at 11/05/23 0925   gabapentin (NEURONTIN)  capsule 200 mg  200 mg Oral QHS Verner Chol, MD   200 mg at 11/04/23 2219   hydrOXYzine (ATARAX) tablet 25 mg  25 mg Oral TID PRN Juliann Pares, NP   25 mg at 11/05/23 0940   latanoprost (XALATAN) 0.005 % ophthalmic solution 1 drop  1 drop Left Eye QHS Verner Chol, MD   1 drop at 11/04/23 2222   magnesium hydroxide (MILK OF MAGNESIA) suspension 30 mL  30 mL Oral Daily PRN Saucier, Jerlyn Ly, NP       meclizine (ANTIVERT) tablet 25 mg  25 mg Oral TID PRN Verner Chol, MD       nicotine (NICODERM CQ - dosed in mg/24 hours) patch 14 mg  14 mg Transdermal Daily Verner Chol, MD   14 mg at 11/05/23 0928   OLANZapine (ZYPREXA)  injection 5 mg  5 mg Intramuscular TID PRN Saucier, Jerlyn Ly, NP       OLANZapine zydis (ZYPREXA) disintegrating tablet 5 mg  5 mg Oral TID PRN Saucier, Jerlyn Ly, NP       ondansetron (ZOFRAN-ODT) disintegrating tablet 4 mg  4 mg Oral Q8H PRN Verner Chol, MD       pantoprazole (PROTONIX) EC tablet 40 mg  40 mg Oral QAC breakfast Verner Chol, MD   40 mg at 11/05/23 4098   Plecanatide TABS 3 mg  1 tablet Oral Daily Verner Chol, MD       senna-docusate (Senokot-S) tablet 2 tablet  2 tablet Oral Daily PRN Verner Chol, MD       traZODone (DESYREL) tablet 50 mg  50 mg Oral QHS Verner Chol, MD   50 mg at 11/04/23 2219    Lab Results:  No results found for this or any previous visit (from the past 48 hours).   Blood Alcohol level:  Lab Results  Component Value Date   ETH <10 11/03/2023   ETH <10 10/15/2023    Metabolic Disorder Labs: Lab Results  Component Value Date   HGBA1C 5.9 (H) 05/03/2023   MPG 123 05/03/2023   No results found for: "PROLACTIN" Lab Results  Component Value Date   CHOL 149 05/03/2023   TRIG 66 05/03/2023   HDL 49 05/03/2023   CHOLHDL 3.0 05/03/2023   VLDL 13 05/03/2023   LDLCALC 87 05/03/2023    Physical Findings: AIMS:  , ,  ,  ,    CIWA:    COWS:      Psychiatric Specialty Exam:  Presentation  General Appearance:  Appropriate for Environment; Casual  Eye Contact: Fair  Speech: Clear and Coherent  Speech Volume: Normal    Mood and Affect  Mood: Anxious; Depressed; Hopeless; Irritable  Affect: Depressed; Flat   Thought Process  Thought Processes: Coherent  Descriptions of Associations:Intact  Orientation:Full (Time, Place and Person)  Thought Content:Illogical  Hallucinations:Hallucinations: None  Ideas of Reference:None  Suicidal Thoughts:Suicidal Thoughts: Yes, Passive SI Active Intent and/or Plan: With Means to Carry Out; With Plan; With Intent SI Passive Intent and/or Plan: Without  Intent; Without Plan  Homicidal Thoughts:Homicidal Thoughts: No   Sensorium  Memory: Immediate Fair; Recent Fair; Remote Poor  Judgment: Impaired  Insight: Shallow   Executive Functions  Concentration: Poor  Attention Span: Poor  Recall: [employer] of Knowledge: Fair  Language: Fair   Psychomotor Activity  Psychomotor Activity: Psychomotor Activity: Normal  Musculoskeletal: Strength & Muscle Tone: within normal limits Gait & Station: normal Assets  Assets: Manufacturing systems engineer; Physical Health; Resilience  Physical Exam: Physical Exam Vitals and nursing note reviewed.  HENT:     Head: Normocephalic.     Right Ear: Tympanic membrane normal.     Mouth/Throat:     Mouth: Mucous membranes are moist.  Eyes:     Pupils: Pupils are equal, round, and reactive to light.  Cardiovascular:     Rate and Rhythm: Normal rate.     Pulses: Normal pulses.  Pulmonary:     Breath sounds: Normal breath sounds.  Abdominal:     General: Bowel sounds are normal.  Neurological:     Mental Status: She is alert.    Review of Systems  Constitutional: Negative.   HENT: Negative.    Eyes: Negative.   Cardiovascular: Negative.   Gastrointestinal: Negative.   Skin: Negative.    Blood pressure 115/64, pulse 64, temperature 98 F (36.7 C), resp. rate 16, height 5\' 3"  (1.6 m), weight 45.6 kg, SpO2 100%. Body mass index is 17.8 kg/m.  Diagnosis: Principal Problem:   Suicidal ideation Active Problems:   MDD (major depressive disorder), recurrent severe, without psychosis (HCC)   PLAN:Clinical Decision Making: Patient with history of depression and anxiety with recent inpatient psychiatric hospitalization few weeks ago, readmitted to the inpatient psychiatric unit after a suicide attempt with overdose on her medications with intent to die.  Patient reports multiple psychosocial stressors that includes living with her son who creates a hostile environment in the house  and patient unable to handle her daily activities, taking care of 7 dogs with no help, worsening her depression and suicidal thoughts.  She needs inpatient hospitalization for further stabilization  Treatment Plan Summary:  Safety and Monitoring: Will consider initiating IVC if patient wants to leave AMA             -- Voluntary admission to inpatient psychiatric unit for safety, stabilization and treatment             -- Daily contact with patient to assess and evaluate symptoms and progress in treatment             -- Patient's case to be discussed in multi-disciplinary team meeting             -- Observation Level: q15 minute checks             -- Vital signs:  q12 hours             -- Precautions: suicide, elopement, and assault   2. Psychiatric Diagnoses and Treatment:               Restarted her home medications-boost for 10 mg twice daily, Lexapro 20 mg gabapentin 100 in the morning and 200 at night Will consider adding Abilify 2 mg nightly to boost up the antidepressant effect   -- The risks/benefits/side-effects/alternatives to this medication were discussed in detail with the patient and time was given for questions. The patient consents to medication trial.                -- Metabolic profile and EKG monitoring obtained while on an atypical antipsychotic (BMI: Lipid Panel: HbgA1c: QTc:)              -- Encouraged patient to participate in unit milieu and in scheduled group therapies                            3. Medical Issues Being Addressed:  No urgent medical needs  are noted at this time   4. Discharge Planning:              -- Social work and case management to assist with discharge planning and identification of hospital follow-up needs prior to discharge             -- Estimated LOS: 5-7 days             -- Discharge Concerns: Need to establish a safety plan; Medication compliance and effectiveness             -- Discharge Goals: Return home with outpatient referrals  follow ups  Verner Chol, MD 11/05/2023, 6:58 PM

## 2023-11-05 NOTE — B[facility] Counselor (Signed)
CSW met with the patient to complete her assessment.  Patient reports that she spoke with her son about having Animal Control come for her dogs.    She reports that they had come to the house but son turned them away because he "didn't know what to do and told them they were my dogs".   She reports that her son has told her that he "can't take them to Animal Control right now and he can't help".  She reports that he was unable to provide her with an ETA.  Penni Homans, MSW, LCSW 11/05/2023 3:34 PM

## 2023-11-05 NOTE — Plan of Care (Signed)
  Problem: Safety: Goal: Periods of time without injury will increase Outcome: Progressing   Problem: Education: Goal: Ability to make informed decisions regarding treatment will improve Outcome: Progressing

## 2023-11-05 NOTE — BH IP Treatment Plan (Signed)
Interdisciplinary Treatment and Diagnostic Plan Update  11/05/2023 Time of Session: 9:47 AM  Julia Osborne Julia Osborne MRN: [id number]  Principal Diagnosis: Suicidal ideation  Secondary Diagnoses: Principal Problem:   Suicidal ideation Active Problems:   MDD (major depressive disorder), recurrent severe, without psychosis (HCC)   Current Medications:  Current Facility-Administered Medications  Medication Dose Route Frequency Provider Last Rate Last Admin   acetaminophen (TYLENOL) tablet 650 mg  650 mg Oral Q6H PRN Saucier, Jerlyn Ly, NP   650 mg at 11/05/23 0940   albuterol (PROVENTIL) (2.5 MG/3ML) 0.083% nebulizer solution 3 mL  3 mL Inhalation Q2H PRN Verner Chol, MD       alum & mag hydroxide-simeth (MAALOX/MYLANTA) 200-200-20 MG/5ML suspension 30 mL  30 mL Oral Q4H PRN Saucier, Jerlyn Ly, NP       aspirin EC tablet 81 mg  81 mg Oral Daily Verner Chol, MD   81 mg at 11/05/23 0925   atorvastatin (LIPITOR) tablet 40 mg  40 mg Oral Daily Verner Chol, MD   40 mg at 11/05/23 0926   brimonidine (ALPHAGAN) 0.2 % ophthalmic solution 1 drop  1 drop Left Eye QHS Verner Chol, MD   1 drop at 11/04/23 2224   brinzolamide (AZOPT) 1 % ophthalmic suspension 1 drop  1 drop Left Eye QHS Verner Chol, MD   1 drop at 11/04/23 2223   busPIRone (BUSPAR) tablet 10 mg  10 mg Oral BID Verner Chol, MD   10 mg at 11/05/23 7829   clopidogrel (PLAVIX) tablet 75 mg  75 mg Oral Daily Verner Chol, MD   75 mg at 11/05/23 5621   docusate sodium (COLACE) capsule 100 mg  100 mg Oral BID Verner Chol, MD   100 mg at 11/04/23 1124   escitalopram (LEXAPRO) tablet 20 mg  20 mg Oral QHS Verner Chol, MD   20 mg at 11/04/23 2219   feeding supplement (ENSURE ENLIVE / ENSURE PLUS) liquid 237 mL  237 mL Oral BID BM Verner Chol, MD   237 mL at 11/05/23 3086   ferrous sulfate tablet 325 mg  325 mg Oral Alford Highland, MD   325 mg at 11/04/23 1124   fluticasone (FLONASE) 50 MCG/ACT nasal spray  1 spray  1 spray Each Nare Daily Verner Chol, MD   1 spray at 11/05/23 5784   gabapentin (NEURONTIN) capsule 100 mg  100 mg Oral Daily Verner Chol, MD   100 mg at 11/05/23 6962   gabapentin (NEURONTIN) capsule 200 mg  200 mg Oral QHS Verner Chol, MD   200 mg at 11/04/23 2219   hydrOXYzine (ATARAX) tablet 25 mg  25 mg Oral TID PRN Juliann Pares, NP   25 mg at 11/05/23 0940   latanoprost (XALATAN) 0.005 % ophthalmic solution 1 drop  1 drop Left Eye QHS Verner Chol, MD   1 drop at 11/04/23 2222   magnesium hydroxide (MILK OF MAGNESIA) suspension 30 mL  30 mL Oral Daily PRN Saucier, Jerlyn Ly, NP       meclizine (ANTIVERT) tablet 25 mg  25 mg Oral TID PRN Verner Chol, MD       nicotine (NICODERM CQ - dosed in mg/24 hours) patch 14 mg  14 mg Transdermal Daily Verner Chol, MD   14 mg at 11/05/23 0928   OLANZapine (ZYPREXA) injection 5 mg  5 mg Intramuscular TID PRN Saucier, Jerlyn Ly, NP       OLANZapine zydis (ZYPREXA) disintegrating tablet 5 mg  5 mg Oral  TID PRN Saucier, Jerlyn Ly, NP       ondansetron (ZOFRAN-ODT) disintegrating tablet 4 mg  4 mg Oral Q8H PRN Verner Chol, MD       pantoprazole (PROTONIX) EC tablet 40 mg  40 mg Oral QAC breakfast Verner Chol, MD   40 mg at 11/05/23 1610   Plecanatide TABS 3 mg  1 tablet Oral Daily Verner Chol, MD       senna-docusate (Senokot-S) tablet 2 tablet  2 tablet Oral BID Verner Chol, MD   2 tablet at 11/04/23 1124   traZODone (DESYREL) tablet 50 mg  50 mg Oral QHS Verner Chol, MD   50 mg at 11/04/23 2219   PTA Medications: Medications Prior to Admission  Medication Sig Dispense Refill Last Dose/Taking   albuterol (PROVENTIL HFA;VENTOLIN HFA) 108 (90 Base) MCG/ACT inhaler Inhale 2 puffs into the lungs every 2 (two) hours as needed for wheezing or shortness of breath (cough). 1 Inhaler 3    albuterol (PROVENTIL) (2.5 MG/3ML) 0.083% nebulizer solution Take 3 mLs (2.5 mg total) by nebulization every 6  (six) hours as needed for wheezing or shortness of breath. 75 mL 12    aspirin EC 81 MG tablet Take 1 tablet (81 mg total) by mouth daily. Swallow whole. 90 each 12    atorvastatin (LIPITOR) 40 MG tablet Take 1 tablet (40 mg total) by mouth daily. 30 tablet 0    brimonidine (ALPHAGAN) 0.2 % ophthalmic solution Place 1 drop into the left eye at bedtime. 20 each 12    brinzolamide (AZOPT) 1 % ophthalmic suspension Place 1 drop into the left eye at bedtime. 15 each 12    busPIRone (BUSPAR) 10 MG tablet Take 1 tablet (10 mg total) by mouth 2 (two) times daily. 60 tablet 0    clopidogrel (PLAVIX) 75 MG tablet Take 1 tablet (75 mg total) by mouth daily. 30 tablet 0    docusate sodium (COLACE) 100 MG capsule Take 1 capsule (100 mg total) by mouth 2 (two) times daily. (Patient not taking: Reported on 10/28/2023) 30 capsule 0    escitalopram (LEXAPRO) 20 MG tablet Take 1 tablet (20 mg total) by mouth at bedtime. 30 tablet 0    feeding supplement (ENSURE ENLIVE / ENSURE PLUS) LIQD Take 237 mLs by mouth 2 (two) times daily between meals. (Patient not taking: Reported on 10/28/2023) 237 mL 12    ferrous sulfate 325 (65 FE) MG tablet Take 1 tablet (325 mg total) by mouth every other day. 45 tablet 0    fluticasone (FLONASE) 50 MCG/ACT nasal spray Place 1 spray into both nostrils daily. 1 g 0    gabapentin (NEURONTIN) 100 MG capsule Take 1 capsule (100 mg total) by mouth daily. 30 capsule 0    gabapentin (NEURONTIN) 100 MG capsule Take 2 capsules (200 mg total) by mouth at bedtime. 60 capsule 0    latanoprost (XALATAN) 0.005 % ophthalmic solution Place 1 drop into the left eye at bedtime. 10 each 12    loratadine (CLARITIN) 10 MG tablet Take 10 mg by mouth daily.      meclizine (ANTIVERT) 25 MG tablet Take 1 tablet (25 mg total) by mouth 3 (three) times daily as needed for dizziness. (Patient not taking: Reported on 10/28/2023) 30 tablet 0    montelukast (SINGULAIR) 10 MG tablet Take 1 tablet (10 mg total) by mouth  daily. 30 tablet 0    ondansetron (ZOFRAN-ODT) 4 MG disintegrating tablet 4mg  ODT q4 hours prn nausea/vomit 12 tablet  0    pantoprazole (PROTONIX) 40 MG tablet Take 1 tablet (40 mg total) by mouth daily before breakfast. 15 tablet 0    Plecanatide (TRULANCE) 3 MG TABS Take 1 tablet by mouth daily. 30 tablet 6    senna-docusate (SENOKOT-S) 8.6-50 MG tablet Take 2 tablets by mouth 2 (two) times daily. 30 tablet 0    sodium chloride (OCEAN) 0.65 % nasal spray Place into the nose.      traZODone (DESYREL) 50 MG tablet Take 1 tablet (50 mg total) by mouth at bedtime. 30 tablet 0     Patient Stressors:    Patient Strengths:    Treatment Modalities: Medication Management, Group therapy, Case management,  1 to 1 session with clinician, Psychoeducation, Recreational therapy.   Physician Treatment Plan for Primary Diagnosis: Suicidal ideation Long Term Goal(s): Improvement in symptoms so as ready for discharge   Short Term Goals: Ability to identify changes in lifestyle to reduce recurrence of condition will improve Ability to verbalize feelings will improve Ability to disclose and discuss suicidal ideas Ability to demonstrate self-control will improve Ability to identify and develop effective coping behaviors will improve Ability to maintain clinical measurements within normal limits will improve Compliance with prescribed medications will improve Ability to identify triggers associated with substance abuse/mental health issues will improve  Medication Management: Evaluate patient's response, side effects, and tolerance of medication regimen.  Therapeutic Interventions: 1 to 1 sessions, Unit Group sessions and Medication administration.  Evaluation of Outcomes: Not Progressing  Physician Treatment Plan for Secondary Diagnosis: Principal Problem:   Suicidal ideation Active Problems:   MDD (major depressive disorder), recurrent severe, without psychosis (HCC)  Long Term Goal(s):  Improvement in symptoms so as ready for discharge   Short Term Goals: Ability to identify changes in lifestyle to reduce recurrence of condition will improve Ability to verbalize feelings will improve Ability to disclose and discuss suicidal ideas Ability to demonstrate self-control will improve Ability to identify and develop effective coping behaviors will improve Ability to maintain clinical measurements within normal limits will improve Compliance with prescribed medications will improve Ability to identify triggers associated with substance abuse/mental health issues will improve     Medication Management: Evaluate patient's response, side effects, and tolerance of medication regimen.  Therapeutic Interventions: 1 to 1 sessions, Unit Group sessions and Medication administration.  Evaluation of Outcomes: Not Progressing   RN Treatment Plan for Primary Diagnosis: Suicidal ideation Long Term Goal(s): Knowledge of disease and therapeutic regimen to maintain health will improve  Short Term Goals: Ability to remain free from injury will improve, Ability to verbalize frustration and anger appropriately will improve, Ability to demonstrate self-control, Ability to participate in decision making will improve, Ability to verbalize feelings will improve, Ability to disclose and discuss suicidal ideas, Ability to identify and develop effective coping behaviors will improve, and Compliance with prescribed medications will improve  Medication Management: RN will administer medications as ordered by provider, will assess and evaluate patient's response and provide education to patient for prescribed medication. RN will report any adverse and/or side effects to prescribing provider.  Therapeutic Interventions: 1 on 1 counseling sessions, Psychoeducation, Medication administration, Evaluate responses to treatment, Monitor vital signs and CBGs as ordered, Perform/monitor CIWA, COWS, AIMS and Fall Risk  screenings as ordered, Perform wound care treatments as ordered.  Evaluation of Outcomes: Not Progressing   LCSW Treatment Plan for Primary Diagnosis: Suicidal ideation Long Term Goal(s): Safe transition to appropriate next level of care at discharge, Engage patient in  therapeutic group addressing interpersonal concerns.  Short Term Goals: Engage patient in aftercare planning with referrals and resources, Increase social support, Increase ability to appropriately verbalize feelings, Increase emotional regulation, Facilitate acceptance of mental health diagnosis and concerns, Facilitate patient progression through stages of change regarding substance use diagnoses and concerns, Identify triggers associated with mental health/substance abuse issues, and Increase skills for wellness and recovery  Therapeutic Interventions: Assess for all discharge needs, 1 to 1 time with Social worker, Explore available resources and support systems, Assess for adequacy in community support network, Educate family and significant other(s) on suicide prevention, Complete Psychosocial Assessment, Interpersonal group therapy.  Evaluation of Outcomes: Not Progressing   Progress in Treatment: Attending groups: Yes. Participating in groups: Yes. Taking medication as prescribed: Yes. Toleration medication: Yes. Family/Significant other contact made: No, will contact:  CSW will contact if given permission  Patient understands diagnosis: Yes. Discussing patient identified problems/goals with staff: Yes. Medical problems stabilized or resolved: Yes. Denies suicidal/homicidal ideation: Yes. Issues/concerns per patient self-inventory: No. Other: None   New problem(s) identified: No, Describe:  None identified   New Short Term/Long Term Goal(s):   elimination of symptoms of psychosis, medication management for mood stabilization; elimination of SI thoughts; development of comprehensive mental wellness plan.   Patient  Goals:  " Just to get to feeling better"   Discharge Plan or Barriers: CSW will assist with appropriate discharge planning   Reason for Continuation of Hospitalization: Depression Medication stabilization  Estimated Length of Stay: 1 to 7 days   Last 3 [place] Suicide Severity Risk Score: Flowsheet Row Admission (Current) from 11/03/2023 in [facility] Most recent reading at 11/03/2023  9:20 PM ED from 11/03/2023 in [facility] Emergency Department at [facility] Most recent reading at 11/03/2023  3:24 PM ED from 11/01/2023 in [facility] Emergency Department at [facility] Most recent reading at 11/01/2023 12:57 PM  C-SSRS RISK CATEGORY High Risk High Risk No Risk       Last PHQ 2/9 Scores:    10/28/2023    5:16 PM 12/07/2022   10:35 AM  Depression screen PHQ 2/9  Decreased Interest 2 2  Down, Depressed, Hopeless 1 2  PHQ - 2 Score 3 4  Altered sleeping 2 1  Tired, decreased energy 1 3  Change in appetite 1 1  Feeling bad or failure about yourself  2 1  Trouble concentrating 1 1  Moving slowly or fidgety/restless 1 0  Suicidal thoughts 0 0  PHQ-9 Score 11 11  Difficult doing work/chores Somewhat difficult Somewhat difficult    Scribe for Treatment Team: Elza Rafter, LCSWA 11/05/2023 11:04 AM

## 2023-11-05 NOTE — Group Note (Signed)
Recreation Therapy Group Note   Group Topic:Communication  Group Date: 11/05/2023 Start Time: 1400 End Time: 1445 Facilitators: Rosina Lowenstein, LRT, CTRS Location:  Dayroom  Group Description: Emotional Check in. Patient sat and talked with LRT about how they are doing and whatever else is on their mind. LRT provided active listening, reassurance and encouragement. Pts were given the opportunity to listen to music or color mandalas while they talk.     Goal Area(s) Addressed:  Patient will engage in conversation with LRT.  Patient will communicate their wants, needs, or questions.   Patient will practice a new coping skill of "talking to someone".    Affect/Mood: Appropriate   Participation Level: Active and Engaged   Participation Quality: Independent   Behavior: Appropriate, Calm, and Cooperative   Speech/Thought Process: Coherent   Insight: Good   Judgement: Good   Modes of Intervention: Open Conversation, Socialization, and Support   Patient Response to Interventions:  Attentive, Engaged, Interested , and Receptive   Education Outcome:  Acknowledges education   Clinical Observations/Individualized Feedback: Julia Osborne was active in their participation of session activities and group discussion. 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 Pt spent her time coloring. Pt interacted well with LRT and peers duration of session.    Plan: Continue to engage patient in RT group sessions 2-3x/week.   Rosina Lowenstein, LRT, CTRS 11/05/2023 3:54 PM

## 2023-11-05 NOTE — Progress Notes (Signed)
   11/05/23 2000  Psych Admission Type (Psych Patients Only)  Admission Status Voluntary  Psychosocial Assessment  Patient Complaints Depression  Eye Contact Fair  Facial Expression Flat  Affect Sad  Speech Slow  Interaction Assertive  Motor Activity Slow  Appearance/Hygiene Unremarkable  Behavior Characteristics Cooperative  Mood Depressed  Thought Process  Coherency WDL  Content Blaming self  Delusions None reported or observed  Perception WDL  Hallucination None reported or observed  Judgment Impaired  Confusion None  Danger to Self  Current suicidal ideation? Denies  Agreement Not to Harm Self Yes  Description of Agreement verbal  Danger to Others  Danger to Others None reported or observed
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
## 2023-11-05 NOTE — Plan of Care (Signed)
D: Pt alert and oriented. Pt reports experiencing anxiety/depression at this time. Pt reports experiencing 6/10 chronic left foot pain at this time, prn medications given. Pt denies experiencing any SI/HI, or AVH at this time.   A: Scheduled medications administered to pt, per MD orders. Support and encouragement provided. Frequent verbal contact made. Routine safety checks conducted q15 minutes.   R: No adverse drug reactions noted. Pt verbally contracts for safety at this time. Pt compliant with medications and treatment plan. Pt interacts well with others on the unit. Pt remains safe at this time. Plan of care ongoing.  Pt refused afternoon scheduled ensure. Pt stated she did not want it at this time. Pt also refused morning colace and senoknot. Pt states she had a loose stool and did not need today. MD informed and medication made prn daily. Pt is satisfied with this change.  Problem: Education: Goal: Emotional status will improve Outcome: Not Progressing   Problem: Activity: Goal: Interest or engagement in activities will improve Outcome: Progressing

## 2023-11-05 NOTE — Progress Notes (Addendum)
Occupational Therapy Group Note  Group Topic:  Pain Management and Coping Group Date: 11/05/2023 Group Time (start and end): 1:00pm - 1:40pm Facilitators:  Wynona Canes, OTR/L  Group Description:  Group discussed impact of chronic/acute pain on safety and independence with functional tasks and impact on mental health.  Identified and discussed previously learned or implemented strategies used and whether these strategies were effective or not. Group educated in the [employer] of Pain and instructed in evidence-based strategies to address improved overall management of pain in conjunction with any individual medical intervention provided by care team. Specific strategies discussed included distraction and pleasant imagery, engagement in meaningful occupations, and two forms of progressive muscle relaxation (mini version to complete while sitting and full version to complete while lying down). Discussed and reviewed how to incorporate learned cognitive behavioral pain coping strategies into daily routines. Allowed time for questions and further discussion of individual experiences.    Therapeutic Goal(s):   Identify and discuss previously utilized pain coping strategies and implications of pain on function/well-being Identify and discuss implementing new cognitive behavioral pain coping strategies into daily routines Demonstrate understanding and performance of learned cognitive behavioral pain coping strategies.    Individual Participation: Pt attended group and was engaged throughout. Offered insight and shared personal accounts of chronic pain history, able to identify strategies used in the past and assess effectiveness, and trialed use of learned strategies during group. Goals for group met.    Participation Level and Quality: Active, engaged, offered to share without prompting   Behavior: Appropriate   Insight: Able to identify used strategies to support pain control   Modes of  Intervention: Visual and return demonstration, handout     Plan: Continue to engage patient in PT/OT groups 1-2x/week.   Arman Filter., MPH, MS, OTR/L ascom [phone] 11/05/23, 4:21 PM

## 2023-11-06 MED ORDER — ARIPIPRAZOLE 5 MG PO TABS
5.0000 mg | ORAL_TABLET | Freq: Every day | ORAL | Status: DC
  Administered 2023-11-06 – 2023-11-12 (×7): 5 mg via ORAL
  Filled 2023-11-06 (×7): qty 1

## 2023-11-06 NOTE — Progress Notes (Signed)
[facility] MD Progress Note  11/06/2023 10:49 AM Julia Osborne  MRN:  [id number]  Julia Osborne is a 63 y.o. female admitted: Presented to the EDfor 11/03/2023  1:44 PM for suicidal attempt. She carries the psychiatric diagnoses of suicidal ideation, major depressive disorder, PTSD, GAD and has a past medical history of COPD, GERD, colon neoplasm, endometrial cancer, diverticulitis, Lynch syndrome.  Subjective:  Chart reviewed, case discussed in multidisciplinary meeting, patient seen during rounds.  Patient is participating in groups.  She reports having an incident at night where she felt very hot when removing her clothes.  She reports that the nurse got upset on her.  She also reports having a phone conversation with her son who informed her that the animal shelter people came by and stated that she needs to drop the dogs off at the shelter and they will not take them from the home by themselves.  She denies SI/HI/plan.  She denies auditory/visual hallucinations.  She did consent for having Abilify 5 mg added at night as an adjunct to her antidepressant   Sleep: Fair  Appetite:  Fair  Past Psychiatric History: see h&P Family History:  Family History  Problem Relation Age of Onset   Depression Mother    Other Mother        clot that went to heart, deceased age 75ss   Heart attack Father        age 31s, deceased   Stroke Father    Cancer Father        "at death determined he was ate up with cancer"   Hypertension Sister    Kidney cancer Sister 55   Diabetes Sister    Diabetes Brother    Hypertension Brother    Brain cancer Paternal Aunt    Cancer Paternal Uncle        NOS   Cancer Paternal Uncle        NOS   Cancer Paternal Uncle        NOS   Heart attack Maternal Grandfather    Endometriosis Daughter    Alcohol abuse Son    Drug abuse Son    Colon cancer Neg Hx    Social History:  Social History   Substance and Sexual Activity  Alcohol Use No     Social History    Substance and Sexual Activity  Drug Use No    Social History   Socioeconomic History   Marital status: Widowed    Spouse name: Not on file   Number of children: 2   Years of education: Not on file   Highest education level: Associate degree: occupational, Scientist, product/process development, or vocational program  Occupational History   Not on file  Tobacco Use   Smoking status: Never   Smokeless tobacco: Never  Vaping Use   Vaping status: Never Used  Substance and Sexual Activity   Alcohol use: No   Drug use: No   Sexual activity: Not Currently    Birth control/protection: Surgical    Comment: hyst  Other Topics Concern   Not on file  Social History Narrative   ** Merged History Encounter **       ** Data from: 04/28/23 Enc Dept: [facility]-PRE ADM TESTING   #  [place]ives [place]; smoking- 1/2 ppd; no alcohol; used to run machines;   FHx-Dad- MI; ? Cancer on autopsy; sisters/aunts- brain; lung cancer x2; oldest sister- kidney cancer  Pt has 59 year old son;daughter 33 years. Brothers- states to have s  poken to her family re: importance of them being checked for lynch.        ** Data from: 05/04/23 Enc Dept: [facility]   Lives alone with 4 large dogs.    Social Drivers of Health   Financial Resource Strain: Medium Risk (12/07/2022)   Overall Financial Resource Strain (CARDIA)    Difficulty of Paying Living Expenses: Somewhat hard  Food Insecurity: Food Insecurity Present (11/03/2023)   Hunger Vital Sign    Worried About Running Out of Food in the Last Year: Sometimes true    Ran Out of Food in the Last Year: Sometimes true  Transportation Needs: Unmet Transportation Needs (11/03/2023)   PRAPARE - Transportation    Lack of Transportation (Medical): Yes    Lack of Transportation (Non-Medical): Yes  Physical Activity: Inactive (12/07/2022)   Exercise Vital Sign    Days of Exercise per Week: 0 days    Minutes of Exercise per Session: 0 min  Stress: Stress Concern Present (12/07/2022)    [employer] of Occupational Health - Occupational Stress Questionnaire    Feeling of Stress : To some extent  Social Connections: Socially Isolated (11/03/2023)   Social Connection and Isolation Panel [NHANES]    Frequency of Communication with Friends and Family: More than three times a week    Frequency of Social Gatherings with Friends and Family: Once a week    Attends Religious Services: Never    Database administrator or Organizations: No    Attends Banker Meetings: Never    Marital Status: Widowed   Past Medical History:  Past Medical History:  Diagnosis Date   Abdominal pain, acute, left upper quadrant    Abdominal pain, epigastric    Abnormal CT scan, sigmoid colon    Abnormal vaginal Pap smear    Adenocarcinoma of colon (HCC) 09/13/2015   Partial colon resection and chemo tx's.    Anemia    Anxiety    Anxiety disorder, unspecified    Aphasia    Asthma    Cancer (HCC)    endometrial; cancer cells in intestine   Chronic pain of left knee    Colon neoplasm    Constipation    Conversion reaction    COPD (chronic obstructive pulmonary disease) (HCC)    no definite diagnosis   Depression    Diverticulitis    Diverticulosis of colon without hemorrhage    Dyspareunia 05/16/2015   Dysphasia    Endometrial cancer (HCC)    Esophageal dysphagia    Family history of cancer    Family history of kidney cancer    Ganglion of left knee    Gastric intestinal metaplasia    GERD (gastroesophageal reflux disease)    Glaucoma    Hematochezia    History of colonic polyps    Hyperlipidemia    Indigestion    Lynch syndrome    Mucosal abnormality of stomach    Nausea and vomiting    Neuropathy    feet and hands   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 drug (HCC)    PONV (postoperative nausea and vomiting)    Pre-diabetes    Primary osteoarthritis of left knee    Rectal bleeding    Reflux esophagitis    Soft tissue swelling of knee joint    Uterine fibroid     UTI (urinary tract infection) 04/26/2023   on cipro   Vaginal dryness 05/16/2015   Vaginal itching    Vaginal Pap smear, abnormal  Past Surgical History:  Procedure Laterality Date   ABDOMINAL HYSTERECTOMY     APPENDECTOMY     BIOPSY N/A 03/14/2015   Procedure: BIOPSY;  Surgeon: Corbin Ade, MD;  Location: [facility];  Service: Endoscopy;  Laterality: N/A;  Gastric   COLONOSCOPY N/A 01/28/2017   Procedure: COLONOSCOPY;  Surgeon: Corbin Ade, MD;  Location: [facility];  Service: Endoscopy;  Laterality: N/A;  11:30am   COLONOSCOPY WITH PROPOFOL N/A 03/14/2015   RMR: Internal hemorrhoids. colonic diverticulosis. Incomplete examination. Prepartation inadequate.   COLONOSCOPY WITH PROPOFOL N/A 07/04/2015   RMR: Colonic diverticulosis . Large polypoid lesion in the vicinity of the hepatic flexure status post saline-assisted piecmeal snare polypectomy  with ablation and tattooing as described. Sigmoid polyp removed as described above. sigmoid colon polyp hyperplastic, hepatic flexure polyp with TA with focal high grade dysplasia    COLONOSCOPY WITH PROPOFOL N/A 11/03/2018   Procedure: COLONOSCOPY WITH PROPOFOL;  Surgeon: Wyline Mood, MD;  Location: [facility] ENDOSCOPY;  Service: Gastroenterology;  Laterality: N/A;   COLONOSCOPY WITH PROPOFOL N/A 12/09/2020   Procedure: COLONOSCOPY WITH PROPOFOL;  Surgeon: Wyline Mood, MD;  Location: [facility] ENDOSCOPY;  Service: Gastroenterology;  Laterality: N/A;  COVID POSITIVE 10/30/2020   COLONOSCOPY WITH PROPOFOL N/A 01/20/2021   Procedure: COLONOSCOPY WITH PROPOFOL;  Surgeon: Wyline Mood, MD;  Location: [facility] ENDOSCOPY;  Service: Gastroenterology;  Laterality: N/A;   COLONOSCOPY WITH PROPOFOL N/A 12/10/2021   Procedure: COLONOSCOPY WITH PROPOFOL;  Surgeon: Wyline Mood, MD;  Location: [facility] ENDOSCOPY;  Service: Gastroenterology;  Laterality: N/A;   COLONOSCOPY WITH PROPOFOL N/A 02/16/2023   Procedure: COLONOSCOPY WITH PROPOFOL;  Surgeon: Wyline Mood, MD;   Location: [facility] ENDOSCOPY;  Service: Gastroenterology;  Laterality: N/A;   ENTEROSCOPY N/A 02/12/2020   Procedure: ENTEROSCOPY;  Surgeon: Wyline Mood, MD;  Location: [facility] ENDOSCOPY;  Service: Gastroenterology;  Laterality: N/A;   ESOPHAGEAL DILATION N/A 03/14/2015   Procedure: ESOPHAGEAL DILATION;  Surgeon: Corbin Ade, MD;  Location: [facility];  Service: Endoscopy;  Laterality: N/AElease Hashimoto 54   ESOPHAGOGASTRODUODENOSCOPY N/A 05/19/2016   Procedure: ESOPHAGOGASTRODUODENOSCOPY (EGD);  Surgeon: Corbin Ade, MD;  [facility];  Service: Endoscopy;  Laterality: N/A;  215   ESOPHAGOGASTRODUODENOSCOPY N/A 01/28/2017   Procedure: ESOPHAGOGASTRODUODENOSCOPY (EGD);  Surgeon: Corbin Ade, MD;  [facility];  Service: Endoscopy;  Laterality: N/A;   ESOPHAGOGASTRODUODENOSCOPY N/A 12/10/2021   Procedure: ESOPHAGOGASTRODUODENOSCOPY (EGD);  Surgeon: Wyline Mood, MD;  Location: [facility] ENDOSCOPY;  Service: Gastroenterology;  Laterality: N/A;   ESOPHAGOGASTRODUODENOSCOPY (EGD) WITH PROPOFOL N/A 03/14/2015   RMR: Mild erosive reflux esophagitis status post passage o f a Maloney dilator. Abnormal gastric mucosa of uncertain significance as described above. status post biopsy, benign   ESOPHAGOGASTRODUODENOSCOPY (EGD) WITH PROPOFOL N/A 11/18/2017   Procedure: ESOPHAGOGASTRODUODENOSCOPY (EGD) WITH PROPOFOL;  Surgeon: Corbin Ade, MD;  [facility];  Service: Endoscopy;  Laterality: N/A;  12:15pm   ESOPHAGOGASTRODUODENOSCOPY (EGD) WITH PROPOFOL N/A 11/03/2018   Procedure: ESOPHAGOGASTRODUODENOSCOPY (EGD) WITH PROPOFOL;  Surgeon: Wyline Mood, MD;  Location: [facility] ENDOSCOPY;  Service: Gastroenterology;  Laterality: N/A;   ESOPHAGOGASTRODUODENOSCOPY (EGD) WITH PROPOFOL N/A 02/16/2023   Procedure: ESOPHAGOGASTRODUODENOSCOPY (EGD) WITH PROPOFOL;  Surgeon: Wyline Mood, MD;  Location: [facility] ENDOSCOPY;  Service: Gastroenterology;  Laterality: N/A;   GIVENS CAPSULE STUDY N/A 12/25/2019    Procedure: GIVENS CAPSULE STUDY;  Surgeon: Wyline Mood, MD;  Location: [facility] ENDOSCOPY;  Service: Gastroenterology;  Laterality: N/A;   MALONEY DILATION N/A 05/19/2016   Procedure: MALONEY DILATION;  Surgeon:  Corbin Ade, MD;  [facility];  Service: Endoscopy;  Laterality: N/A;   MALONEY DILATION N/A 01/28/2017   Procedure: [name] DILATION;  Surgeon: Corbin Ade, MD;  [facility];  Service: Endoscopy;  Laterality: N/A;   MALONEY DILATION N/A 11/18/2017   Procedure: [name] DILATION;  Surgeon: Corbin Ade, MD;  [facility];  Service: Endoscopy;  Laterality: N/A;   PARTIAL COLECTOMY  08/30/2015   polyp with adenocarcinoma   PARTIAL COLECTOMY  08/29/2018   POLYPECTOMY N/A 07/04/2015   Procedure: POLYPECTOMY;  Surgeon: Corbin Ade, MD;  Location: [facility];  Service: Endoscopy;  Laterality: N/A;   PORTACATH PLACEMENT Right 09/21/2014   TUBAL LIGATION      Current Medications: Current Facility-Administered Medications  Medication Dose Route Frequency Provider Last Rate Last Admin   acetaminophen (TYLENOL) tablet 650 mg  650 mg Oral Q6H PRN Saucier, Jerlyn Ly, NP   650 mg at 11/06/23 0833   albuterol (PROVENTIL) (2.5 MG/3ML) 0.083% nebulizer solution 3 mL  3 mL Inhalation Q2H PRN Verner Chol, MD       alum & mag hydroxide-simeth (MAALOX/MYLANTA) 200-200-20 MG/5ML suspension 30 mL  30 mL Oral Q4H PRN Saucier, Jerlyn Ly, NP       aspirin EC tablet 81 mg  81 mg Oral Daily Verner Chol, MD   81 mg at 11/06/23 9562   atorvastatin (LIPITOR) tablet 40 mg  40 mg Oral Daily Verner Chol, MD   40 mg at 11/06/23 0823   brimonidine (ALPHAGAN) 0.2 % ophthalmic solution 1 drop  1 drop Left Eye QHS Verner Chol, MD   1 drop at 11/05/23 2211   brinzolamide (AZOPT) 1 % ophthalmic suspension 1 drop  1 drop Left Eye QHS Verner Chol, MD   1 drop at 11/05/23 2213   busPIRone (BUSPAR) tablet 10 mg  10 mg Oral BID Verner Chol, MD   10 mg at  11/06/23 1308   clopidogrel (PLAVIX) tablet 75 mg  75 mg Oral Daily Verner Chol, MD   75 mg at 11/06/23 6578   docusate sodium (COLACE) capsule 100 mg  100 mg Oral Daily PRN Verner Chol, MD       escitalopram (LEXAPRO) tablet 20 mg  20 mg Oral QHS Verner Chol, MD   20 mg at 11/05/23 2209   feeding supplement (ENSURE ENLIVE / ENSURE PLUS) liquid 237 mL  237 mL Oral BID BM Verner Chol, MD   237 mL at 11/06/23 4696   ferrous sulfate tablet 325 mg  325 mg Oral Alford Highland, MD   325 mg at 11/06/23 0823   fluticasone (FLONASE) 50 MCG/ACT nasal spray 1 spray  1 spray Each Nare Daily Verner Chol, MD   1 spray at 11/06/23 2952   gabapentin (NEURONTIN) capsule 100 mg  100 mg Oral Daily Verner Chol, MD   100 mg at 11/06/23 8413   gabapentin (NEURONTIN) capsule 200 mg  200 mg Oral QHS Verner Chol, MD   200 mg at 11/05/23 2209   hydrOXYzine (ATARAX) tablet 25 mg  25 mg Oral TID PRN Juliann Pares, NP   25 mg at 11/06/23 0832   latanoprost (XALATAN) 0.005 % ophthalmic solution 1 drop  1 drop Left Eye QHS Verner Chol, MD   1 drop at 11/05/23 2211   magnesium hydroxide (MILK OF MAGNESIA) suspension 30 mL  30 mL Oral Daily PRN Saucier, Jerlyn Ly, NP       meclizine (ANTIVERT) tablet  25 mg  25 mg Oral TID PRN Verner Chol, MD       nicotine (NICODERM CQ - dosed in mg/24 hours) patch 14 mg  14 mg Transdermal Daily Verner Chol, MD   14 mg at 11/06/23 0824   OLANZapine (ZYPREXA) injection 5 mg  5 mg Intramuscular TID PRN Saucier, Jerlyn Ly, NP       OLANZapine zydis (ZYPREXA) disintegrating tablet 5 mg  5 mg Oral TID PRN Saucier, Jerlyn Ly, NP       ondansetron (ZOFRAN-ODT) disintegrating tablet 4 mg  4 mg Oral Q8H PRN Verner Chol, MD       pantoprazole (PROTONIX) EC tablet 40 mg  40 mg Oral QAC breakfast Verner Chol, MD   40 mg at 11/06/23 1610   Plecanatide TABS 3 mg  1 tablet Oral Daily Verner Chol, MD       senna-docusate (Senokot-S)  tablet 2 tablet  2 tablet Oral Daily PRN Verner Chol, MD       traZODone (DESYREL) tablet 50 mg  50 mg Oral QHS Verner Chol, MD   50 mg at 11/05/23 2209    Lab Results:  No results found for this or any previous visit (from the past 48 hours).   Blood Alcohol level:  Lab Results  Component Value Date   ETH <10 11/03/2023   ETH <10 10/15/2023    Metabolic Disorder Labs: Lab Results  Component Value Date   HGBA1C 5.9 (H) 05/03/2023   MPG 123 05/03/2023   No results found for: "PROLACTIN" Lab Results  Component Value Date   CHOL 149 05/03/2023   TRIG 66 05/03/2023   HDL 49 05/03/2023   CHOLHDL 3.0 05/03/2023   VLDL 13 05/03/2023   LDLCALC 87 05/03/2023    Physical Findings: AIMS:  , ,  ,  ,    CIWA:    COWS:      Psychiatric Specialty Exam:  Presentation  General Appearance:  Appropriate for Environment; Casual  Eye Contact: Fair  Speech: Clear and Coherent  Speech Volume: Normal    Mood and Affect  Mood: Anxious; Depressed  Affect: Depressed; Flat   Thought Process  Thought Processes: Coherent  Descriptions of Associations:Intact  Orientation:Full (Time, Place and Person)  Thought Content:Rumination  Hallucinations:Hallucinations: None  Ideas of Reference:None  Suicidal Thoughts:Suicidal Thoughts: Yes, Passive SI Passive Intent and/or Plan: Without Intent; Without Plan  Homicidal Thoughts:Homicidal Thoughts: No   Sensorium  Memory: Immediate Fair; Recent Fair; Remote Fair  Judgment: Impaired  Insight: Shallow   Executive Functions  Concentration: Fair  Attention Span: Fair  Recall: Fair  Fund of Knowledge: Fair  Language: Fair   Psychomotor Activity  Psychomotor Activity: Psychomotor Activity: Normal  Musculoskeletal: Strength & Muscle Tone: within normal limits Gait & Station: normal Assets  Assets: Manufacturing systems engineer; Desire for Improvement; Physical Health    Physical Exam: Physical  Exam Vitals and nursing note reviewed.  HENT:     Head: Normocephalic.     Right Ear: Tympanic membrane normal.     Mouth/Throat:     Mouth: Mucous membranes are moist.  Eyes:     Pupils: Pupils are equal, round, and reactive to light.  Cardiovascular:     Rate and Rhythm: Normal rate.     Pulses: Normal pulses.  Pulmonary:     Breath sounds: Normal breath sounds.  Abdominal:     General: Bowel sounds are normal.  Neurological:     Mental Status: She is alert.  Review of Systems  Constitutional: Negative.   HENT: Negative.    Eyes: Negative.   Cardiovascular: Negative.   Gastrointestinal: Negative.   Skin: Negative.    Blood pressure 120/77, pulse 65, temperature 97.8 F (36.6 C), temperature source Oral, resp. rate 20, height 5\' 3"  (1.6 m), weight 45.6 kg, SpO2 100%. Body mass index is 17.8 kg/m.  Diagnosis: Principal Problem:   Suicidal ideation Active Problems:   MDD (major depressive disorder), recurrent severe, without psychosis (HCC)   PLAN:Clinical Decision Making: Patient with history of depression and anxiety with recent inpatient psychiatric hospitalization few weeks ago, readmitted to the inpatient psychiatric unit after a suicide attempt with overdose on her medications with intent to die.  Patient reports multiple psychosocial stressors that includes living with her son who creates a hostile environment in the house and patient unable to handle her daily activities, taking care of 7 dogs with no help, worsening her depression and suicidal thoughts.  She needs inpatient hospitalization for further stabilization  Treatment Plan Summary:  Safety and Monitoring: Will consider initiating IVC if patient wants to leave AMA             -- Voluntary admission to inpatient psychiatric unit for safety, stabilization and treatment             -- Daily contact with patient to assess and evaluate symptoms and progress in treatment             -- Patient's case to be  discussed in multi-disciplinary team meeting             -- Observation Level: q15 minute checks             -- Vital signs:  q12 hours             -- Precautions: suicide, elopement, and assault   2. Psychiatric Diagnoses and Treatment:               Restarted her home medications-boost for 10 mg twice daily, Lexapro 20 mg gabapentin 100 in the morning and 200 at night Pt consented to Abilify 5mg  nightly to boost up the antidepressant effect   -- The risks/benefits/side-effects/alternatives to this medication were discussed in detail with the patient and time was given for questions. The patient consents to medication trial.                -- Metabolic profile and EKG monitoring obtained while on an atypical antipsychotic (BMI: Lipid Panel: HbgA1c: QTc:)              -- Encouraged patient to participate in unit milieu and in scheduled group therapies                            3. Medical Issues Being Addressed:  No urgent medical needs are noted at this time   4. Discharge Planning:              -- Social work and case management to assist with discharge planning and identification of hospital follow-up needs prior to discharge             -- Estimated LOS: 5-7 days             -- Discharge Concerns: Need to establish a safety plan; Medication compliance and effectiveness             -- Discharge Goals: Return home with outpatient referrals follow ups  Verner Chol, MD 11/06/2023, 10:49 AM

## 2023-11-06 NOTE — Group Note (Signed)
LCSW Group Therapy Note  Group Date: 11/06/2023 Start Time: 1310 End Time: 1350   Type of Therapy and Topic:  Group Therapy: Positive Affirmations  Participation Level:  Active   Description of Group:   This group addressed positive affirmation towards self and others.  Patients went around the room and identified two positive things about themselves and two positive things about a peer in the room.  Patients reflected on how it felt to share something positive with others, to identify positive things about themselves, and to hear positive things from others/ Patients were encouraged to have a daily reflection of positive characteristics or circumstances.   Therapeutic Goals: Patients will verbalize two of their positive qualities Patients will demonstrate empathy for others by stating two positive qualities about a peer in the group Patients will verbalize their feelings when voicing positive self affirmations and when voicing positive affirmations of others Patients will discuss the potential positive impact on their wellness/recovery of focusing on positive traits of self and others.  Summary of Patient Progress:  The patient actively engaged in the discussion. She was able to identify positive affirmations about herself as well as other group members. Patient demonstrated good insight into the subject matter, was respectful of peers, and participated throughout the entire session.  Therapeutic Modalities:   Cognitive Behavioral Therapy Motivational Interviewing    Azucena Kuba, Theresia Majors 11/06/2023  3:54 PM

## 2023-11-06 NOTE — Plan of Care (Signed)
D: Pt alert and oriented. Pt reports experiencing anxiety/depression at this time. Pt reports experiencing 5/10 chronic left foot pain at this time, prn medication given. Pt denies experiencing any SI/HI, or AVH at this time.   A: Scheduled medications administered to pt, per MD orders. Support and encouragement provided. Frequent verbal contact made. Routine safety checks conducted q15 minutes.   R: No adverse drug reactions noted. Pt verbally contracts for safety at this time. Pt compliant with medications and treatment plan. Pt interacts well with others on the unit. Pt remains safe at this time. Plan of care ongoing.  Problem: Education: Goal: Knowledge of [place] [facility] Education information/materials will improve Outcome: Progressing Goal: Emotional status will improve Outcome: Not Progressing

## 2023-11-07 MED ORDER — BUSPIRONE HCL 5 MG PO TABS
7.5000 mg | ORAL_TABLET | Freq: Two times a day (BID) | ORAL | Status: DC
  Administered 2023-11-07 – 2023-11-13 (×12): 7.5 mg via ORAL
  Filled 2023-11-07 (×12): qty 2

## 2023-11-07 MED ORDER — LINACLOTIDE 145 MCG PO CAPS
145.0000 ug | ORAL_CAPSULE | Freq: Every day | ORAL | Status: DC
  Administered 2023-11-08 – 2023-11-13 (×4): 145 ug via ORAL
  Filled 2023-11-07 (×6): qty 1

## 2023-11-07 NOTE — Progress Notes (Signed)
[facility] MD Progress Note  11/07/2023 1:26 PM Julia Osborne  MRN:  [id number]  Julia Osborne is a 63 y.o. female admitted: Presented to the EDfor 11/03/2023  1:44 PM for suicidal attempt. She carries the psychiatric diagnoses of suicidal ideation, major depressive disorder, PTSD, GAD and has a past medical history of COPD, GERD, colon neoplasm, endometrial cancer, diverticulitis, Lynch syndrome.  Subjective:  Chart reviewed, case discussed in multidisciplinary meeting, patient seen during rounds.  Patient reports feeling tired and feeling sleepy today morning.  She reports that she slept only 3 hours last night and is able to eat half of her breakfast.  She continues to endorse feeling depressed and anxious, hopeless.  She continues to worry about working on giving her dogs to the shelter.  She reports that her son is refusing to call and make an appointment to drop off the dogs.  Patient reports that she is going to call the shelter when it is open on Tuesday.  She has passive SI when she thinks about going back home.  She denies current active SI/plan, denies HI/plan.  She denies auditory/visual hallucinations.  Sleep: Fair  Appetite:  Fair  Past Psychiatric History: see h&P Family History:  Family History  Problem Relation Age of Onset   Depression Mother    Other Mother        clot that went to heart, deceased age 32ss   Heart attack Father        age 76s, deceased   Stroke Father    Cancer Father        "at death determined he was ate up with cancer"   Hypertension Sister    Kidney cancer Sister 42   Diabetes Sister    Diabetes Brother    Hypertension Brother    Brain cancer Paternal Aunt    Cancer Paternal Uncle        NOS   Cancer Paternal Uncle        NOS   Cancer Paternal Uncle        NOS   Heart attack Maternal Grandfather    Endometriosis Daughter    Alcohol abuse Son    Drug abuse Son    Colon cancer Neg Hx    Social History:  Social History   Substance and Sexual  Activity  Alcohol Use No     Social History   Substance and Sexual Activity  Drug Use No    Social History   Socioeconomic History   Marital status: Widowed    Spouse name: Not on file   Number of children: 2   Years of education: Not on file   Highest education level: Associate degree: occupational, Scientist, product/process development, or vocational program  Occupational History   Not on file  Tobacco Use   Smoking status: Never   Smokeless tobacco: Never  Vaping Use   Vaping status: Never Used  Substance and Sexual Activity   Alcohol use: No   Drug use: No   Sexual activity: Not Currently    Birth control/protection: Surgical    Comment: hyst  Other Topics Concern   Not on file  Social History Narrative   ** Merged History Encounter **       ** Data from: 04/28/23 Enc Dept: [facility]-PRE ADM TESTING   #  [place]ives [place]; smoking- 1/2 ppd; no alcohol; used to run machines;   FHx-Dad- MI; ? Cancer on autopsy; sisters/aunts- brain; lung cancer x2; oldest sister- kidney cancer  Pt has  28 year old son;daughter 33 years. Brothers- states to have s   poken to her family re: importance of them being checked for lynch.        ** Data from: 05/04/23 Enc Dept: [facility]   Lives alone with 4 large dogs.    Social Drivers of Health   Financial Resource Strain: Medium Risk (12/07/2022)   Overall Financial Resource Strain (CARDIA)    Difficulty of Paying Living Expenses: Somewhat hard  Food Insecurity: Food Insecurity Present (11/03/2023)   Hunger Vital Sign    Worried About Running Out of Food in the Last Year: Sometimes true    Ran Out of Food in the Last Year: Sometimes true  Transportation Needs: Unmet Transportation Needs (11/03/2023)   PRAPARE - Transportation    Lack of Transportation (Medical): Yes    Lack of Transportation (Non-Medical): Yes  Physical Activity: Inactive (12/07/2022)   Exercise Vital Sign    Days of Exercise per Week: 0 days    Minutes of Exercise per Session: 0 min   Stress: Stress Concern Present (12/07/2022)   [employer] of Occupational Health - Occupational Stress Questionnaire    Feeling of Stress : To some extent  Social Connections: Socially Isolated (11/03/2023)   Social Connection and Isolation Panel [NHANES]    Frequency of Communication with Friends and Family: More than three times a week    Frequency of Social Gatherings with Friends and Family: Once a week    Attends Religious Services: Never    Database administrator or Organizations: No    Attends Banker Meetings: Never    Marital Status: Widowed   Past Medical History:  Past Medical History:  Diagnosis Date   Abdominal pain, acute, left upper quadrant    Abdominal pain, epigastric    Abnormal CT scan, sigmoid colon    Abnormal vaginal Pap smear    Adenocarcinoma of colon (HCC) 09/13/2015   Partial colon resection and chemo tx's.    Anemia    Anxiety    Anxiety disorder, unspecified    Aphasia    Asthma    Cancer (HCC)    endometrial; cancer cells in intestine   Chronic pain of left knee    Colon neoplasm    Constipation    Conversion reaction    COPD (chronic obstructive pulmonary disease) (HCC)    no definite diagnosis   Depression    Diverticulitis    Diverticulosis of colon without hemorrhage    Dyspareunia 05/16/2015   Dysphasia    Endometrial cancer (HCC)    Esophageal dysphagia    Family history of cancer    Family history of kidney cancer    Ganglion of left knee    Gastric intestinal metaplasia    GERD (gastroesophageal reflux disease)    Glaucoma    Hematochezia    History of colonic polyps    Hyperlipidemia    Indigestion    Lynch syndrome    Mucosal abnormality of stomach    Nausea and vomiting    Neuropathy    feet and hands   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 drug (HCC)    PONV (postoperative nausea and vomiting)    Pre-diabetes    Primary osteoarthritis of left knee    Rectal bleeding    Reflux esophagitis    Soft  tissue swelling of knee joint    Uterine fibroid    UTI (urinary tract infection) 04/26/2023   on cipro   Vaginal dryness 05/16/2015  Vaginal itching    Vaginal Pap smear, abnormal     Past Surgical History:  Procedure Laterality Date   ABDOMINAL HYSTERECTOMY     APPENDECTOMY     BIOPSY N/A 03/14/2015   Procedure: BIOPSY;  Surgeon: Corbin Ade, MD;  Location: [facility];  Service: Endoscopy;  Laterality: N/A;  Gastric   COLONOSCOPY N/A 01/28/2017   Procedure: COLONOSCOPY;  Surgeon: Corbin Ade, MD;  Location: [facility];  Service: Endoscopy;  Laterality: N/A;  11:30am   COLONOSCOPY WITH PROPOFOL N/A 03/14/2015   RMR: Internal hemorrhoids. colonic diverticulosis. Incomplete examination. Prepartation inadequate.   COLONOSCOPY WITH PROPOFOL N/A 07/04/2015   RMR: Colonic diverticulosis . Large polypoid lesion in the vicinity of the hepatic flexure status post saline-assisted piecmeal snare polypectomy  with ablation and tattooing as described. Sigmoid polyp removed as described above. sigmoid colon polyp hyperplastic, hepatic flexure polyp with TA with focal high grade dysplasia    COLONOSCOPY WITH PROPOFOL N/A 11/03/2018   Procedure: COLONOSCOPY WITH PROPOFOL;  Surgeon: Wyline Mood, MD;  Location: [facility] ENDOSCOPY;  Service: Gastroenterology;  Laterality: N/A;   COLONOSCOPY WITH PROPOFOL N/A 12/09/2020   Procedure: COLONOSCOPY WITH PROPOFOL;  Surgeon: Wyline Mood, MD;  Location: [facility] ENDOSCOPY;  Service: Gastroenterology;  Laterality: N/A;  COVID POSITIVE 10/30/2020   COLONOSCOPY WITH PROPOFOL N/A 01/20/2021   Procedure: COLONOSCOPY WITH PROPOFOL;  Surgeon: Wyline Mood, MD;  Location: [facility] ENDOSCOPY;  Service: Gastroenterology;  Laterality: N/A;   COLONOSCOPY WITH PROPOFOL N/A 12/10/2021   Procedure: COLONOSCOPY WITH PROPOFOL;  Surgeon: Wyline Mood, MD;  Location: [facility] ENDOSCOPY;  Service: Gastroenterology;  Laterality: N/A;   COLONOSCOPY WITH PROPOFOL N/A 02/16/2023   Procedure:  COLONOSCOPY WITH PROPOFOL;  Surgeon: Wyline Mood, MD;  Location: [facility] ENDOSCOPY;  Service: Gastroenterology;  Laterality: N/A;   ENTEROSCOPY N/A 02/12/2020   Procedure: ENTEROSCOPY;  Surgeon: Wyline Mood, MD;  Location: [facility] ENDOSCOPY;  Service: Gastroenterology;  Laterality: N/A;   ESOPHAGEAL DILATION N/A 03/14/2015   Procedure: ESOPHAGEAL DILATION;  Surgeon: Corbin Ade, MD;  Location: [facility];  Service: Endoscopy;  Laterality: N/AElease Hashimoto 54   ESOPHAGOGASTRODUODENOSCOPY N/A 05/19/2016   Procedure: ESOPHAGOGASTRODUODENOSCOPY (EGD);  Surgeon: Corbin Ade, MD;  [facility];  Service: Endoscopy;  Laterality: N/A;  215   ESOPHAGOGASTRODUODENOSCOPY N/A 01/28/2017   Procedure: ESOPHAGOGASTRODUODENOSCOPY (EGD);  Surgeon: Corbin Ade, MD;  [facility];  Service: Endoscopy;  Laterality: N/A;   ESOPHAGOGASTRODUODENOSCOPY N/A 12/10/2021   Procedure: ESOPHAGOGASTRODUODENOSCOPY (EGD);  Surgeon: Wyline Mood, MD;  Location: [facility] ENDOSCOPY;  Service: Gastroenterology;  Laterality: N/A;   ESOPHAGOGASTRODUODENOSCOPY (EGD) WITH PROPOFOL N/A 03/14/2015   RMR: Mild erosive reflux esophagitis status post passage o f a Maloney dilator. Abnormal gastric mucosa of uncertain significance as described above. status post biopsy, benign   ESOPHAGOGASTRODUODENOSCOPY (EGD) WITH PROPOFOL N/A 11/18/2017   Procedure: ESOPHAGOGASTRODUODENOSCOPY (EGD) WITH PROPOFOL;  Surgeon: Corbin Ade, MD;  [facility];  Service: Endoscopy;  Laterality: N/A;  12:15pm   ESOPHAGOGASTRODUODENOSCOPY (EGD) WITH PROPOFOL N/A 11/03/2018   Procedure: ESOPHAGOGASTRODUODENOSCOPY (EGD) WITH PROPOFOL;  Surgeon: Wyline Mood, MD;  Location: [facility] ENDOSCOPY;  Service: Gastroenterology;  Laterality: N/A;   ESOPHAGOGASTRODUODENOSCOPY (EGD) WITH PROPOFOL N/A 02/16/2023   Procedure: ESOPHAGOGASTRODUODENOSCOPY (EGD) WITH PROPOFOL;  Surgeon: Wyline Mood, MD;  Location: [facility] ENDOSCOPY;  Service: Gastroenterology;   Laterality: N/A;   GIVENS CAPSULE STUDY N/A 12/25/2019   Procedure: GIVENS CAPSULE STUDY;  Surgeon: Wyline Mood, MD;  Location: [facility] ENDOSCOPY;  Service: Gastroenterology;  Laterality: N/A;  MALONEY DILATION N/A 05/19/2016   Procedure: [name] DILATION;  Surgeon: Corbin Ade, MD;  [facility];  Service: Endoscopy;  Laterality: N/A;   MALONEY DILATION N/A 01/28/2017   Procedure: [name] DILATION;  Surgeon: Corbin Ade, MD;  [facility];  Service: Endoscopy;  Laterality: N/A;   MALONEY DILATION N/A 11/18/2017   Procedure: [name] DILATION;  Surgeon: Corbin Ade, MD;  [facility];  Service: Endoscopy;  Laterality: N/A;   PARTIAL COLECTOMY  08/30/2015   polyp with adenocarcinoma   PARTIAL COLECTOMY  08/29/2018   POLYPECTOMY N/A 07/04/2015   Procedure: POLYPECTOMY;  Surgeon: Corbin Ade, MD;  Location: [facility];  Service: Endoscopy;  Laterality: N/A;   PORTACATH PLACEMENT Right 09/21/2014   TUBAL LIGATION      Current Medications: Current Facility-Administered Medications  Medication Dose Route Frequency Provider Last Rate Last Admin   acetaminophen (TYLENOL) tablet 650 mg  650 mg Oral Q6H PRN Saucier, Jerlyn Ly, NP   650 mg at 11/07/23 1215   albuterol (PROVENTIL) (2.5 MG/3ML) 0.083% nebulizer solution 3 mL  3 mL Inhalation Q2H PRN Verner Chol, MD       alum & mag hydroxide-simeth (MAALOX/MYLANTA) 200-200-20 MG/5ML suspension 30 mL  30 mL Oral Q4H PRN Saucier, Jerlyn Ly, NP       ARIPiprazole (ABILIFY) tablet 5 mg  5 mg Oral QHS Verner Chol, MD   5 mg at 11/06/23 2148   aspirin EC tablet 81 mg  81 mg Oral Daily Verner Chol, MD   81 mg at 11/07/23 0856   atorvastatin (LIPITOR) tablet 40 mg  40 mg Oral Daily Verner Chol, MD   40 mg at 11/07/23 0855   brimonidine (ALPHAGAN) 0.2 % ophthalmic solution 1 drop  1 drop Left Eye QHS Verner Chol, MD   1 drop at 11/06/23 2226   brinzolamide (AZOPT) 1 % ophthalmic suspension 1  drop  1 drop Left Eye QHS Verner Chol, MD   1 drop at 11/06/23 2226   busPIRone (BUSPAR) tablet 10 mg  10 mg Oral BID Verner Chol, MD   10 mg at 11/07/23 6578   clopidogrel (PLAVIX) tablet 75 mg  75 mg Oral Daily Verner Chol, MD   75 mg at 11/07/23 0856   docusate sodium (COLACE) capsule 100 mg  100 mg Oral Daily PRN Verner Chol, MD       escitalopram (LEXAPRO) tablet 20 mg  20 mg Oral QHS Verner Chol, MD   20 mg at 11/06/23 2147   feeding supplement (ENSURE ENLIVE / ENSURE PLUS) liquid 237 mL  237 mL Oral BID BM Verner Chol, MD   237 mL at 11/07/23 0956   ferrous sulfate tablet 325 mg  325 mg Oral Alford Highland, MD   325 mg at 11/06/23 0823   fluticasone (FLONASE) 50 MCG/ACT nasal spray 1 spray  1 spray Each Nare Daily Verner Chol, MD   1 spray at 11/07/23 0901   gabapentin (NEURONTIN) capsule 100 mg  100 mg Oral Daily Verner Chol, MD   100 mg at 11/07/23 0855   gabapentin (NEURONTIN) capsule 200 mg  200 mg Oral QHS Verner Chol, MD   200 mg at 11/06/23 2147   hydrOXYzine (ATARAX) tablet 25 mg  25 mg Oral TID PRN Juliann Pares, NP   25 mg at 11/06/23 2148   latanoprost (XALATAN) 0.005 % ophthalmic solution 1 drop  1 drop Left Eye QHS Verner Chol, MD   1  drop at 11/06/23 2225   magnesium hydroxide (MILK OF MAGNESIA) suspension 30 mL  30 mL Oral Daily PRN Saucier, Jerlyn Ly, NP       meclizine (ANTIVERT) tablet 25 mg  25 mg Oral TID PRN Verner Chol, MD       nicotine (NICODERM CQ - dosed in mg/24 hours) patch 14 mg  14 mg Transdermal Daily Verner Chol, MD   14 mg at 11/07/23 0856   OLANZapine (ZYPREXA) injection 5 mg  5 mg Intramuscular TID PRN Saucier, Jerlyn Ly, NP       OLANZapine zydis (ZYPREXA) disintegrating tablet 5 mg  5 mg Oral TID PRN Saucier, Jerlyn Ly, NP       ondansetron (ZOFRAN-ODT) disintegrating tablet 4 mg  4 mg Oral Q8H PRN Verner Chol, MD       pantoprazole (PROTONIX) EC tablet 40 mg  40 mg Oral QAC  breakfast Verner Chol, MD   40 mg at 11/07/23 0855   Plecanatide TABS 3 mg  1 tablet Oral Daily Verner Chol, MD       senna-docusate (Senokot-S) tablet 2 tablet  2 tablet Oral Daily PRN Verner Chol, MD       traZODone (DESYREL) tablet 50 mg  50 mg Oral QHS Verner Chol, MD   50 mg at 11/06/23 2148    Lab Results:  No results found for this or any previous visit (from the past 48 hours).   Blood Alcohol level:  Lab Results  Component Value Date   ETH <10 11/03/2023   ETH <10 10/15/2023    Metabolic Disorder Labs: Lab Results  Component Value Date   HGBA1C 5.9 (H) 05/03/2023   MPG 123 05/03/2023   No results found for: "PROLACTIN" Lab Results  Component Value Date   CHOL 149 05/03/2023   TRIG 66 05/03/2023   HDL 49 05/03/2023   CHOLHDL 3.0 05/03/2023   VLDL 13 05/03/2023   LDLCALC 87 05/03/2023    Physical Findings: AIMS:  , ,  ,  ,    CIWA:    COWS:      Psychiatric Specialty Exam:  Presentation  General Appearance:  Appropriate for Environment; Casual  Eye Contact: Fair  Speech: Clear and Coherent  Speech Volume: Normal    Mood and Affect  Mood: Anxious; Depressed; Hopeless  Affect: Depressed; Flat   Thought Process  Thought Processes: Coherent  Descriptions of Associations:Intact  Orientation:Full (Time, Place and Person)  Thought Content:Illogical; Rumination  Hallucinations:Hallucinations: Julia Osborne  Ideas of Reference:Julia Osborne  Suicidal Thoughts:Suicidal Thoughts: Yes, Passive SI Passive Intent and/or Plan: Without Intent; Without Plan  Homicidal Thoughts:Homicidal Thoughts: No   Sensorium  Memory: Immediate Fair; Recent Fair; Remote Fair  Judgment: Impaired  Insight: Shallow   Executive Functions  Concentration: Poor  Attention Span: Fair  Recall: [employer] of Knowledge: Fair  Language: Fair   Psychomotor Activity  Psychomotor Activity: Psychomotor Activity:  Normal  Musculoskeletal: Strength & Muscle Tone: within normal limits Gait & Station: normal Assets  Assets: Manufacturing systems engineer; Desire for Improvement; Physical Health    Physical Exam: Physical Exam Vitals and nursing note reviewed.  HENT:     Head: Normocephalic.     Right Ear: Tympanic membrane normal.     Mouth/Throat:     Mouth: Mucous membranes are moist.  Eyes:     Pupils: Pupils are equal, round, and reactive to light.  Cardiovascular:     Rate and Rhythm: Normal rate.     Pulses: Normal pulses.  Pulmonary:     Breath sounds: Normal breath sounds.  Abdominal:     General: Bowel sounds are normal.  Neurological:     Mental Status: She is alert.    Review of Systems  Constitutional: Negative.   HENT: Negative.    Eyes: Negative.   Cardiovascular: Negative.   Gastrointestinal: Negative.   Skin: Negative.    Blood pressure (!) 98/53, pulse 60, temperature 97.7 F (36.5 C), resp. rate 18, height 5\' 3"  (1.6 m), weight 45.6 kg, SpO2 99%. Body mass index is 17.8 kg/m.  Diagnosis: Principal Problem:   Suicidal ideation Active Problems:   MDD (major depressive disorder), recurrent severe, without psychosis (HCC)   PLAN:Clinical Decision Making: Patient with history of depression and anxiety with recent inpatient psychiatric hospitalization few weeks ago, readmitted to the inpatient psychiatric unit after a suicide attempt with overdose on her medications with intent to die.  Patient reports multiple psychosocial stressors that includes living with her son who creates a hostile environment in the house and patient unable to handle her daily activities, taking care of 7 dogs with no help, worsening her depression and suicidal thoughts.  She needs inpatient hospitalization for further stabilization  Treatment Plan Summary:  Safety and Monitoring: Will consider initiating IVC if patient wants to leave AMA             -- Voluntary admission to inpatient psychiatric  unit for safety, stabilization and treatment             -- Daily contact with patient to assess and evaluate symptoms and progress in treatment             -- Patient's case to be discussed in multi-disciplinary team meeting             -- Observation Level: q15 minute checks             -- Vital signs:  q12 hours             -- Precautions: suicide, elopement, and assault   2. Psychiatric Diagnoses and Treatment:               Lexapro 20 mg gabapentin 100 in the morning and 200 at night Added Abilify 5mg  nightly to boost up the antidepressant effect  Pt wants to cut down on Buspar as she doesn't want to be on multiple psychiatric meds. -- The risks/benefits/side-effects/alternatives to this medication were discussed in detail with the patient and time was given for questions. The patient consents to medication trial.                -- Metabolic profile and EKG monitoring obtained while on an atypical antipsychotic (BMI: Lipid Panel: HbgA1c: QTc:)              -- Encouraged patient to participate in unit milieu and in scheduled group therapies                            3. Medical Issues Being Addressed:  No urgent medical needs are noted at this time   4. Discharge Planning:              -- Social work and case management to assist with discharge planning and identification of hospital follow-up needs prior to discharge             -- Estimated LOS: 5-7 days             --  Discharge Concerns: Need to establish a safety plan; Medication compliance and effectiveness             -- Discharge Goals: Return home with outpatient referrals follow ups  Verner Chol, MD 11/07/2023, 1:26 PM

## 2023-11-07 NOTE — Progress Notes (Signed)
Patient admitted to [facility] on Nov 03, 2023 after intentional overdose on various medications and complaints of worsening anxiety and panic attacks. Patient recently discharged from [place] on Jan 31.  Patient reports poor sleep which conflicts with the documented sleep hours of 8.5. Support and encouragement offered despite. Patient denies SI/HI/AVH. She endorses anxiety and depression which scheduled meds assist with that. Tylenol given for 3/10 bil hand pain. Upon follow up, pain decreased to 0.  Q15 minute unit checks in place.

## 2023-11-07 NOTE — Progress Notes (Signed)
   11/07/23 0600  15 Minute Checks  Location Bedroom  Visual Appearance Calm  Behavior Sleeping  Sleep (Behavioral Health Patients Only)  Calculate sleep? (Click Yes once per 24 hr at 0600 safety check) Yes  Documented sleep last 24 hours 8.5

## 2023-11-07 NOTE — Progress Notes (Signed)
   11/06/23 2148  Psych Admission Type (Psych Patients Only)  Admission Status Voluntary  Psychosocial Assessment  Patient Complaints Anxiety;Depression  Eye Contact Fair  Facial Expression Flat  Affect Sad  Speech Logical/coherent  Interaction Assertive  Motor Activity Slow  Appearance/Hygiene Unremarkable  Behavior Characteristics Cooperative  Mood Depressed  Aggressive Behavior  Effect No apparent injury  Thought Process  Coherency WDL  Content Preoccupation (Concerned w/animal placement\)  Delusions None reported or observed  Perception WDL  Hallucination None reported or observed  Judgment Limited  Confusion None  Danger to Self  Current suicidal ideation? Denies  Self-Injurious Behavior No self-injurious ideation or behavior indicators observed or expressed   Agreement Not to Harm Self Yes  Description of Agreement Verbal  Danger to Others  Danger to Others None reported or observed

## 2023-11-07 NOTE — Progress Notes (Signed)
   11/07/23 2130  Psych Admission Type (Psych Patients Only)  Admission Status Voluntary  Psychosocial Assessment  Patient Complaints Anxiety;Depression  Eye Contact Brief  Facial Expression Flat  Affect Anxious  Speech Logical/coherent  Interaction Assertive  Motor Activity Slow  Appearance/Hygiene Unremarkable  Behavior Characteristics Cooperative  Mood Depressed;Anxious  Thought Process  Coherency WDL  Content WDL  Delusions None reported or observed  Perception WDL  Hallucination None reported or observed  Judgment Limited  Confusion None  Danger to Self  Current suicidal ideation? Denies  Agreement Not to Harm Self Yes  Description of Agreement verbal  Danger to Others  Danger to Others None reported or observed   Progress note   D: Pt seen in dayroom interacting with peers. Pt denies SI, HI, AVH. Pt rates pain  8/10 as chronic generalized pain in back and knees. Pt rates anxiety  4/10 and depression  6/10. Complained of eyes being irritated. Asked about discharge. Encouraged pt to speak to provider in the morning. No other concerns noted at this time.  A: Pt provided support and encouragement. Pt given scheduled medication as prescribed. PRNs as appropriate. Q15 min checks for safety.   R: Pt safe on the unit. Will continue to monitor.

## 2023-11-07 NOTE — Plan of Care (Signed)
Patient alert and oriented. Denies SI, HI, AVH, and pain. Scheduled medications administered to patient, per MD orders. Support and encouragement provided.  Routine safety checks conducted every 15 minutes.  Patient informed to notify staff with problems or concerns. Patient verbally contracts for safety at this time. Patient compliant with medications and treatment plan. Patient receptive, calm, and cooperative. Patient interacts well with others on the unit.  Patient remains safe at this time. Care, comfort and safety maintained/ongoing.  Problem: Education: Goal: Knowledge of [facility] Education information/materials will improve Outcome: Progressing Goal: Emotional status will improve Outcome: Progressing Goal: Mental status will improve Outcome: Progressing Goal: Verbalization of understanding the information provided will improve Outcome: Progressing   Problem: Activity: Goal: Interest or engagement in activities will improve Outcome: Progressing Goal: Sleeping patterns will improve Outcome: Progressing   Problem: Coping: Goal: Ability to verbalize frustrations and anger appropriately will improve Outcome: Progressing Goal: Ability to demonstrate self-control will improve Outcome: Progressing   Problem: Health Behavior/Discharge Planning: Goal: Identification of resources available to assist in meeting health care needs will improve Outcome: Progressing Goal: Compliance with treatment plan for underlying cause of condition will improve Outcome: Progressing   Problem: Physical Regulation: Goal: Ability to maintain clinical measurements within normal limits will improve Outcome: Progressing   Problem: Safety: Goal: Periods of time without injury will increase Outcome: Progressing   Problem: Education: Goal: Ability to make informed decisions regarding treatment will improve Outcome: Progressing   Problem: Coping: Goal: Coping ability will improve Outcome:  Progressing   Problem: Health Behavior/Discharge Planning: Goal: Identification of resources available to assist in meeting health care needs will improve Outcome: Progressing   Problem: Medication: Goal: Compliance with prescribed medication regimen will improve Outcome: Progressing   Problem: Self-Concept: Goal: Ability to disclose and discuss suicidal ideas will improve Outcome: Progressing Goal: Will verbalize positive feelings about self Outcome: Progressing Note: Patient is on track. Patient will maintain adherence, work toward meeting this goal by discharge, adhere to provider and/or lab appointments, avoid flare triggers, and be monitored by provider to determine if a change in treatment plan is warranted.

## 2023-11-07 NOTE — Plan of Care (Signed)
   Problem: Education: Goal: Knowledge of [facility] Education information/materials will improve Outcome: Progressing Goal: Emotional status will improve Outcome: Progressing

## 2023-11-08 NOTE — Progress Notes (Signed)
   11/08/23 0630  15 Minute Checks  Location Bedroom  Visual Appearance Calm  Behavior Sleeping  Sleep (Behavioral Health Patients Only)  Calculate sleep? (Click Yes once per 24 hr at 0600 safety check) Yes  Documented sleep last 24 hours 8.5

## 2023-11-08 NOTE — Progress Notes (Signed)
   11/08/23 1100  Psych Admission Type (Psych Patients Only)  Admission Status Voluntary  Psychosocial Assessment  Patient Complaints Anxiety;Depression  Eye Contact Fair  Facial Expression Flat  Affect Sad  Speech Logical/coherent  Interaction Assertive  Motor Activity Slow  Appearance/Hygiene Unremarkable  Behavior Characteristics Cooperative  Mood Depressed;Anxious  Thought Process  Coherency WDL  Content Blaming self  Delusions None reported or observed  Perception WDL  Hallucination None reported or observed  Judgment Limited  Confusion None  Danger to Self  Current suicidal ideation? Denies  Agreement Not to Harm Self Yes  Description of Agreement verbal  Danger to Others  Danger to Others None reported or observed

## 2023-11-08 NOTE — Progress Notes (Signed)
   11/08/23 1924  Psych Admission Type (Psych Patients Only)  Admission Status Voluntary  Psychosocial Assessment  Patient Complaints Anxiety;Depression;Worrying  Eye Contact Fair  Facial Expression Anxious  Affect Depressed  Speech Logical/coherent  Interaction Assertive  Motor Activity Slow  Appearance/Hygiene Unremarkable  Behavior Characteristics Cooperative;Appropriate to situation;Anxious  Mood Depressed;Anxious  Thought Process  Coherency WDL  Content Blaming self;Blaming others  Delusions None reported or observed  Perception WDL  Hallucination None reported or observed  Judgment Limited  Confusion None  Danger to Self  Current suicidal ideation? Denies  Self-Injurious Behavior No self-injurious ideation or behavior indicators observed or expressed   Agreement Not to Harm Self Yes  Description of Agreement verbal  Danger to Others  Danger to Others None reported or observed   Progress note   D: Pt seen at nurse's station. Pt denies SI, HI, AVH. Pt rates pain  8/10 as chronic generalized pain in her lower back, feet and legs. Pt has some non-pitting edema in bilateral ankles. States it hurts worse when she is sitting with legs in a dependent position. Says she puts her feet up when in the dayroom if there is room to do so. Pt endorses anxiety and depression r/t her family situation. Her son moved back in with her a year ago and is not helping with paying bills or upkeep. "My check pays for everything and I can support myself and the dogs but not him too. He has a good job but doesn't feel like he needs to contribute anything. It's hard on me."  No other concerns noted at this time.  A: Pt provided support and encouragement. Pt given scheduled medication as prescribed. PRNs as appropriate. Q15 min checks for safety.   R: Pt safe on the unit. Will continue to monitor.

## 2023-11-08 NOTE — Progress Notes (Signed)
[facility] MD Progress Note  11/08/2023 4:17 PM Julia Osborne  MRN:  [id number]  Julia Osborne is a 63 y.o. female admitted: Presented to the EDfor 11/03/2023  1:44 PM for suicidal attempt. She carries the psychiatric diagnoses of suicidal ideation, major depressive disorder, PTSD, GAD and has a past medical history of COPD, GERD, colon neoplasm, endometrial cancer, diverticulitis, Lynch syndrome.  Subjective:  Chart reviewed, case discussed in multidisciplinary meeting, patient seen during rounds.  Patient reports feeling tired and feeling sleepy today morning.  She was recently discharged three weeks ago and she reportedly lives with her son actually her son lives with her. She has been feeling isolated and she's not able to feed her big dogs because of financial issues. Her son works third shift and that leads to her feeling isolated. She doesn't have many resources in her town feels hopeless and worthless, but denies any suicidal thoughts. NO side effects to medications. She denies current active SI/plan, denies HI/plan.  She denies auditory/visual hallucinations. Plan to continue current medication plan and monitor pt.   Sleep: Fair  Appetite:  Fair  Past Psychiatric History: see h&P Family History:  Family History  Problem Relation Age of Onset   Depression Mother    Other Mother        clot that went to heart, deceased age 49ss   Heart attack Father        age 20s, deceased   Stroke Father    Cancer Father        "at death determined he was ate up with cancer"   Hypertension Sister    Kidney cancer Sister 75   Diabetes Sister    Diabetes Brother    Hypertension Brother    Brain cancer Paternal Aunt    Cancer Paternal Uncle        NOS   Cancer Paternal Uncle        NOS   Cancer Paternal Uncle        NOS   Heart attack Maternal Grandfather    Endometriosis Daughter    Alcohol abuse Son    Drug abuse Son    Colon cancer Neg Hx    Social History:  Social History   Substance  and Sexual Activity  Alcohol Use No     Social History   Substance and Sexual Activity  Drug Use No    Social History   Socioeconomic History   Marital status: Widowed    Spouse name: Not on file   Number of children: 2   Years of education: Not on file   Highest education level: Associate degree: occupational, Scientist, product/process development, or vocational program  Occupational History   Not on file  Tobacco Use   Smoking status: Never   Smokeless tobacco: Never  Vaping Use   Vaping status: Never Used  Substance and Sexual Activity   Alcohol use: No   Drug use: No   Sexual activity: Not Currently    Birth control/protection: Surgical    Comment: hyst  Other Topics Concern   Not on file  Social History Narrative   ** Merged History Encounter **       ** Data from: 04/28/23 Enc Dept: [facility]-PRE ADM TESTING   #  lives in [place];self; smoking- 1/2 ppd; no alcohol; used to run machines;   FHx-Dad- MI; ? Cancer on autopsy; sisters/aunts- brain; lung cancer x2; oldest sister- kidney cancer  Pt has 37 year old son;daughter 33 years. Brothers- states to have s  poken to her family re: importance of them being checked for lynch.        ** Data from: 05/04/23 Enc Dept: [facility]   Lives alone with 4 large dogs.    Social Drivers of Health   Financial Resource Strain: Medium Risk (12/07/2022)   Overall Financial Resource Strain (CARDIA)    Difficulty of Paying Living Expenses: Somewhat hard  Food Insecurity: Food Insecurity Present (11/03/2023)   Hunger Vital Sign    Worried About Running Out of Food in the Last Year: Sometimes true    Ran Out of Food in the Last Year: Sometimes true  Transportation Needs: Unmet Transportation Needs (11/03/2023)   PRAPARE - Transportation    Lack of Transportation (Medical): Yes    Lack of Transportation (Non-Medical): Yes  Physical Activity: Inactive (12/07/2022)   Exercise Vital Sign    Days of Exercise per Week: 0 days    Minutes of Exercise per  Session: 0 min  Stress: Stress Concern Present (12/07/2022)   [employer] of Occupational Health - Occupational Stress Questionnaire    Feeling of Stress : To some extent  Social Connections: Socially Isolated (11/03/2023)   Social Connection and Isolation Panel [NHANES]    Frequency of Communication with Friends and Family: More than three times a week    Frequency of Social Gatherings with Friends and Family: Once a week    Attends Religious Services: Never    Database administrator or Organizations: No    Attends Banker Meetings: Never    Marital Status: Widowed   Past Medical History:  Past Medical History:  Diagnosis Date   Abdominal pain, acute, left upper quadrant    Abdominal pain, epigastric    Abnormal CT scan, sigmoid colon    Abnormal vaginal Pap smear    Adenocarcinoma of colon (HCC) 09/13/2015   Partial colon resection and chemo tx's.    Anemia    Anxiety    Anxiety disorder, unspecified    Aphasia    Asthma    Cancer (HCC)    endometrial; cancer cells in intestine   Chronic pain of left knee    Colon neoplasm    Constipation    Conversion reaction    COPD (chronic obstructive pulmonary disease) (HCC)    no definite diagnosis   Depression    Diverticulitis    Diverticulosis of colon without hemorrhage    Dyspareunia 05/16/2015   Dysphasia    Endometrial cancer (HCC)    Esophageal dysphagia    Family history of cancer    Family history of kidney cancer    Ganglion of left knee    Gastric intestinal metaplasia    GERD (gastroesophageal reflux disease)    Glaucoma    Hematochezia    History of colonic polyps    Hyperlipidemia    Indigestion    Lynch syndrome    Mucosal abnormality of stomach    Nausea and vomiting    Neuropathy    feet and hands   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 drug (HCC)    PONV (postoperative nausea and vomiting)    Pre-diabetes    Primary osteoarthritis of left knee    Rectal bleeding    Reflux  esophagitis    Soft tissue swelling of knee joint    Uterine fibroid    UTI (urinary tract infection) 04/26/2023   on cipro   Vaginal dryness 05/16/2015   Vaginal itching    Vaginal Pap smear, abnormal  Past Surgical History:  Procedure Laterality Date   ABDOMINAL HYSTERECTOMY     APPENDECTOMY     BIOPSY N/A 03/14/2015   Procedure: BIOPSY;  Surgeon: Corbin Ade, MD;  Location: [facility];  Service: Endoscopy;  Laterality: N/A;  Gastric   COLONOSCOPY N/A 01/28/2017   Procedure: COLONOSCOPY;  Surgeon: Corbin Ade, MD;  Location: [facility];  Service: Endoscopy;  Laterality: N/A;  11:30am   COLONOSCOPY WITH PROPOFOL N/A 03/14/2015   RMR: Internal hemorrhoids. colonic diverticulosis. Incomplete examination. Prepartation inadequate.   COLONOSCOPY WITH PROPOFOL N/A 07/04/2015   RMR: Colonic diverticulosis . Large polypoid lesion in the vicinity of the hepatic flexure status post saline-assisted piecmeal snare polypectomy  with ablation and tattooing as described. Sigmoid polyp removed as described above. sigmoid colon polyp hyperplastic, hepatic flexure polyp with TA with focal high grade dysplasia    COLONOSCOPY WITH PROPOFOL N/A 11/03/2018   Procedure: COLONOSCOPY WITH PROPOFOL;  Surgeon: Wyline Mood, MD;  Location: [facility] ENDOSCOPY;  Service: Gastroenterology;  Laterality: N/A;   COLONOSCOPY WITH PROPOFOL N/A 12/09/2020   Procedure: COLONOSCOPY WITH PROPOFOL;  Surgeon: Wyline Mood, MD;  Location: [facility] ENDOSCOPY;  Service: Gastroenterology;  Laterality: N/A;  COVID POSITIVE 10/30/2020   COLONOSCOPY WITH PROPOFOL N/A 01/20/2021   Procedure: COLONOSCOPY WITH PROPOFOL;  Surgeon: Wyline Mood, MD;  Location: [facility] ENDOSCOPY;  Service: Gastroenterology;  Laterality: N/A;   COLONOSCOPY WITH PROPOFOL N/A 12/10/2021   Procedure: COLONOSCOPY WITH PROPOFOL;  Surgeon: Wyline Mood, MD;  Location: [facility] ENDOSCOPY;  Service: Gastroenterology;  Laterality: N/A;   COLONOSCOPY WITH PROPOFOL N/A 02/16/2023    Procedure: COLONOSCOPY WITH PROPOFOL;  Surgeon: Wyline Mood, MD;  Location: [facility] ENDOSCOPY;  Service: Gastroenterology;  Laterality: N/A;   ENTEROSCOPY N/A 02/12/2020   Procedure: ENTEROSCOPY;  Surgeon: Wyline Mood, MD;  Location: [facility] ENDOSCOPY;  Service: Gastroenterology;  Laterality: N/A;   ESOPHAGEAL DILATION N/A 03/14/2015   Procedure: ESOPHAGEAL DILATION;  Surgeon: Corbin Ade, MD;  Location: [facility];  Service: Endoscopy;  Laterality: N/AElease Hashimoto 54   ESOPHAGOGASTRODUODENOSCOPY N/A 05/19/2016   Procedure: ESOPHAGOGASTRODUODENOSCOPY (EGD);  Surgeon: Corbin Ade, MD;  [facility];  Service: Endoscopy;  Laterality: N/A;  215   ESOPHAGOGASTRODUODENOSCOPY N/A 01/28/2017   Procedure: ESOPHAGOGASTRODUODENOSCOPY (EGD);  Surgeon: Corbin Ade, MD;  [facility];  Service: Endoscopy;  Laterality: N/A;   ESOPHAGOGASTRODUODENOSCOPY N/A 12/10/2021   Procedure: ESOPHAGOGASTRODUODENOSCOPY (EGD);  Surgeon: Wyline Mood, MD;  Location: [facility] ENDOSCOPY;  Service: Gastroenterology;  Laterality: N/A;   ESOPHAGOGASTRODUODENOSCOPY (EGD) WITH PROPOFOL N/A 03/14/2015   RMR: Mild erosive reflux esophagitis status post passage o f a Maloney dilator. Abnormal gastric mucosa of uncertain significance as described above. status post biopsy, benign   ESOPHAGOGASTRODUODENOSCOPY (EGD) WITH PROPOFOL N/A 11/18/2017   Procedure: ESOPHAGOGASTRODUODENOSCOPY (EGD) WITH PROPOFOL;  Surgeon: Corbin Ade, MD;  [facility];  Service: Endoscopy;  Laterality: N/A;  12:15pm   ESOPHAGOGASTRODUODENOSCOPY (EGD) WITH PROPOFOL N/A 11/03/2018   Procedure: ESOPHAGOGASTRODUODENOSCOPY (EGD) WITH PROPOFOL;  Surgeon: Wyline Mood, MD;  Location: [facility] ENDOSCOPY;  Service: Gastroenterology;  Laterality: N/A;   ESOPHAGOGASTRODUODENOSCOPY (EGD) WITH PROPOFOL N/A 02/16/2023   Procedure: ESOPHAGOGASTRODUODENOSCOPY (EGD) WITH PROPOFOL;  Surgeon: Wyline Mood, MD;  Location: [facility] ENDOSCOPY;  Service:  Gastroenterology;  Laterality: N/A;   GIVENS CAPSULE STUDY N/A 12/25/2019   Procedure: GIVENS CAPSULE STUDY;  Surgeon: Wyline Mood, MD;  Location: [facility] ENDOSCOPY;  Service: Gastroenterology;  Laterality: N/A;   MALONEY DILATION N/A 05/19/2016   Procedure: MALONEY DILATION;  Surgeon:  Corbin Ade, MD;  [facility];  Service: Endoscopy;  Laterality: N/A;   MALONEY DILATION N/A 01/28/2017   Procedure: [name] DILATION;  Surgeon: Corbin Ade, MD;  [facility];  Service: Endoscopy;  Laterality: N/A;   MALONEY DILATION N/A 11/18/2017   Procedure: [name] DILATION;  Surgeon: Corbin Ade, MD;  [facility];  Service: Endoscopy;  Laterality: N/A;   PARTIAL COLECTOMY  08/30/2015   polyp with adenocarcinoma   PARTIAL COLECTOMY  08/29/2018   POLYPECTOMY N/A 07/04/2015   Procedure: POLYPECTOMY;  Surgeon: Corbin Ade, MD;  Location: [facility];  Service: Endoscopy;  Laterality: N/A;   PORTACATH PLACEMENT Right 09/21/2014   TUBAL LIGATION      Current Medications: Current Facility-Administered Medications  Medication Dose Route Frequency Provider Last Rate Last Admin   acetaminophen (TYLENOL) tablet 650 mg  650 mg Oral Q6H PRN Saucier, Jerlyn Ly, NP   650 mg at 11/07/23 1215   albuterol (PROVENTIL) (2.5 MG/3ML) 0.083% nebulizer solution 3 mL  3 mL Inhalation Q2H PRN Verner Chol, MD       alum & mag hydroxide-simeth (MAALOX/MYLANTA) 200-200-20 MG/5ML suspension 30 mL  30 mL Oral Q4H PRN Saucier, Jerlyn Ly, NP       ARIPiprazole (ABILIFY) tablet 5 mg  5 mg Oral QHS Verner Chol, MD   5 mg at 11/07/23 2133   aspirin EC tablet 81 mg  81 mg Oral Daily Verner Chol, MD   81 mg at 11/08/23 0943   atorvastatin (LIPITOR) tablet 40 mg  40 mg Oral Daily Verner Chol, MD   40 mg at 11/08/23 0943   brimonidine (ALPHAGAN) 0.2 % ophthalmic solution 1 drop  1 drop Left Eye QHS Verner Chol, MD   1 drop at 11/07/23 2132   brinzolamide (AZOPT) 1 %  ophthalmic suspension 1 drop  1 drop Left Eye QHS Verner Chol, MD   1 drop at 11/07/23 2131   busPIRone (BUSPAR) tablet 7.5 mg  7.5 mg Oral BID Verner Chol, MD   7.5 mg at 11/08/23 2536   clopidogrel (PLAVIX) tablet 75 mg  75 mg Oral Daily Verner Chol, MD   75 mg at 11/08/23 6440   docusate sodium (COLACE) capsule 100 mg  100 mg Oral Daily PRN Verner Chol, MD       escitalopram (LEXAPRO) tablet 20 mg  20 mg Oral QHS Verner Chol, MD   20 mg at 11/07/23 2132   feeding supplement (ENSURE ENLIVE / ENSURE PLUS) liquid 237 mL  237 mL Oral BID BM Verner Chol, MD   237 mL at 11/07/23 0956   ferrous sulfate tablet 325 mg  325 mg Oral Alford Highland, MD   325 mg at 11/08/23 0943   fluticasone (FLONASE) 50 MCG/ACT nasal spray 1 spray  1 spray Each Nare Daily Verner Chol, MD   1 spray at 11/08/23 3474   gabapentin (NEURONTIN) capsule 100 mg  100 mg Oral Daily Verner Chol, MD   100 mg at 11/08/23 2595   gabapentin (NEURONTIN) capsule 200 mg  200 mg Oral QHS Verner Chol, MD   200 mg at 11/07/23 2132   hydrOXYzine (ATARAX) tablet 25 mg  25 mg Oral TID PRN Juliann Pares, NP   25 mg at 11/07/23 2134   latanoprost (XALATAN) 0.005 % ophthalmic solution 1 drop  1 drop Left Eye QHS Verner Chol, MD   1 drop at 11/07/23 2131   linaclotide (LINZESS) capsule 145 mcg  145 mcg Oral QAC breakfast Verner Chol, MD   145 mcg at 11/08/23 8295   magnesium hydroxide (MILK OF MAGNESIA) suspension 30 mL  30 mL Oral Daily PRN Saucier, Jerlyn Ly, NP       meclizine (ANTIVERT) tablet 25 mg  25 mg Oral TID PRN Verner Chol, MD       nicotine (NICODERM CQ - dosed in mg/24 hours) patch 14 mg  14 mg Transdermal Daily Verner Chol, MD   14 mg at 11/08/23 0944   OLANZapine (ZYPREXA) injection 5 mg  5 mg Intramuscular TID PRN Saucier, Jerlyn Ly, NP       OLANZapine zydis (ZYPREXA) disintegrating tablet 5 mg  5 mg Oral TID PRN Saucier, Jerlyn Ly, NP       ondansetron  (ZOFRAN-ODT) disintegrating tablet 4 mg  4 mg Oral Q8H PRN Verner Chol, MD       pantoprazole (PROTONIX) EC tablet 40 mg  40 mg Oral QAC breakfast Verner Chol, MD   40 mg at 11/08/23 1057   senna-docusate (Senokot-S) tablet 2 tablet  2 tablet Oral Daily PRN Verner Chol, MD       traZODone (DESYREL) tablet 50 mg  50 mg Oral QHS Verner Chol, MD   50 mg at 11/07/23 2134    Lab Results:  No results found for this or any previous visit (from the past 48 hours).   Blood Alcohol level:  Lab Results  Component Value Date   ETH <10 11/03/2023   ETH <10 10/15/2023    Metabolic Disorder Labs: Lab Results  Component Value Date   HGBA1C 5.9 (H) 05/03/2023   MPG 123 05/03/2023   No results found for: "PROLACTIN" Lab Results  Component Value Date   CHOL 149 05/03/2023   TRIG 66 05/03/2023   HDL 49 05/03/2023   CHOLHDL 3.0 05/03/2023   VLDL 13 05/03/2023   LDLCALC 87 05/03/2023    Physical Findings: AIMS:  , ,  ,  ,    CIWA:    COWS:      Psychiatric Specialty Exam:  Presentation  General Appearance:  Appropriate for Environment; Casual  Eye Contact: Fair  Speech: Clear and Coherent  Speech Volume: Normal    Mood and Affect  Mood: Anxious; Depressed; Hopeless  Affect: Depressed; Flat   Thought Process  Thought Processes: Coherent  Descriptions of Associations:Intact  Orientation:Full (Time, Place and Person)  Thought Content:Illogical; Rumination  Hallucinations:Hallucinations: None  Ideas of Reference:None  Suicidal Thoughts: DENIES Homicidal Thoughts:Homicidal Thoughts: No   Sensorium  Memory: Immediate Fair; Recent Fair; Remote Fair  Judgment: Impaired  Insight: Impaired    Executive Functions  Concentration: Poor  Attention Span: Fair  Recall: [employer] of Knowledge: Fair  Language: Fair   Psychomotor Activity  Psychomotor Activity: Psychomotor Activity: Normal  Musculoskeletal: Strength & Muscle  Tone: within normal limits Gait & Station: normal Assets  Assets: Manufacturing systems engineer; Desire for Improvement; Physical Health    Physical Exam: Physical Exam Vitals and nursing note reviewed.  HENT:     Head: Normocephalic.     Right Ear: Tympanic membrane normal.     Mouth/Throat:     Mouth: Mucous membranes are moist.  Eyes:     Pupils: Pupils are equal, round, and reactive to light.  Cardiovascular:     Rate and Rhythm: Normal rate.     Pulses: Normal pulses.  Pulmonary:     Breath sounds: Normal breath sounds.  Abdominal:     General: Bowel  sounds are normal.  Neurological:     Mental Status: She is alert.    Review of Systems  Constitutional: Negative.   HENT: Negative.    Eyes: Negative.   Cardiovascular: Negative.   Gastrointestinal: Negative.   Skin: Negative.    Blood pressure 115/61, pulse (!) 58, temperature (!) 97.5 F (36.4 C), resp. rate 18, height 5\' 3"  (1.6 m), weight 45.6 kg, SpO2 100%. Body mass index is 17.8 kg/m.  Diagnosis: Principal Problem:   Suicidal ideation Active Problems:   MDD (major depressive disorder), recurrent severe, without psychosis (HCC)   PLAN:Clinical Decision Making: Patient with history of depression and anxiety with recent inpatient psychiatric hospitalization few weeks ago, readmitted to the inpatient psychiatric unit after a suicide attempt with overdose on her medications with intent to die.  Patient reports multiple psychosocial stressors that includes living with her son who creates a hostile environment in the house and patient unable to handle her daily activities, taking care of 7 dogs with no help, worsening her depression and suicidal thoughts.  She needs inpatient hospitalization for further stabilization  Treatment Plan Summary:  Safety and Monitoring: Will consider initiating IVC if patient wants to leave AMA             -- Voluntary admission to inpatient psychiatric unit for safety, stabilization and  treatment             -- Daily contact with patient to assess and evaluate symptoms and progress in treatment             -- Patient's case to be discussed in multi-disciplinary team meeting             -- Observation Level: q15 minute checks             -- Vital signs:  q12 hours             -- Precautions: suicide, elopement, and assault   2. Psychiatric Diagnoses and Treatment:               Lexapro 20 mg gabapentin 100 in the morning and 200 at night Continue Abilify 5mg  nightly to boost up the antidepressant effect  Pt wants to cut down on Buspar as she doesn't want to be on multiple psychiatric meds. -- The risks/benefits/side-effects/alternatives to this medication were discussed in detail with the patient and time was given for questions. The patient consents to medication trial.                -- Metabolic profile and EKG monitoring obtained while on an atypical antipsychotic (BMI: Lipid Panel: HbgA1c: QTc:)              -- Encouraged patient to participate in unit milieu and in scheduled group therapies                            3. Medical Issues Being Addressed:  No urgent medical needs are noted at this time   4. Discharge Planning:              -- Social work and case management to assist with discharge planning and identification of hospital follow-up needs prior to discharge             -- Estimated LOS: 5-7 days             -- Discharge Concerns: Need to establish a safety plan; Medication compliance and effectiveness             --  Discharge Goals: Return home with outpatient referrals follow ups  Timmie Foerster, MD 11/08/2023, 4:17 PM

## 2023-11-08 NOTE — Group Note (Signed)
Date:  11/08/2023 Time:  9:54 PM  Group Topic/Focus:  Wrap-Up Group:   The focus of this group is to help patients review their daily goal of treatment and discuss progress on daily workbooks.    Participation Level:  Active  Participation Quality:  Appropriate  Affect:  Appropriate  Cognitive:  Appropriate  Insight: Appropriate  Engagement in Group:  Engaged  Modes of Intervention:  Discussion and Support  Additional Comments:  Pt shared that today was a good day on the unit, the highlight of which was "finally" talking to her son on the phone. On the subject of ways to stay well upon discharge, Pt mentioned wanting to be more social and making sure her appetite doesn't wane. "I don't eat as much when I feel depressed. I need to get back into church." Both the Clinical research associate and Pt's peers encouraged Janan to re-engage with her church as a way to stay well.  Christ Kick 11/08/2023, 9:54 PM

## 2023-11-08 NOTE — Plan of Care (Signed)
   Problem: Education: Goal: Emotional status will improve Outcome: Progressing Goal: Mental status will improve Outcome: Progressing

## 2023-11-08 NOTE — Progress Notes (Signed)
   11/08/23 0600  15 Minute Checks  Location Bedroom  Visual Appearance Calm  Behavior Sleeping  Sleep (Behavioral Health Patients Only)  Calculate sleep? (Click Yes once per 24 hr at 0600 safety check) Yes  Documented sleep last 24 hours 8.5

## 2023-11-08 NOTE — Group Note (Signed)
Recreation Therapy Group Note   Group Topic:Healthy Support Systems  Group Date: 11/08/2023 Start Time: 1500 End Time: 1550 Facilitators: Rosina Lowenstein, LRT, CTRS Location:  Dayroom  Group Description: Straw Bridge. In groups or individually, patients were given 10 plastic drinking straws and an equal length of masking tape. Using the materials provided, patients were instructed to build a free-standing bridge-like structure to suspend an everyday item (ex: deck of cards) off the floor or table surface. All materials were required to be used in Secondary school teacher. LRT facilitated post-activity discussion reviewing the importance of having strong and healthy support systems in our lives. LRT discussed how the people in our lives serve as the tape and the deck of cards we placed on top of our straw structure are the stressors we face in daily life. LRT and pts discussed what happens in our life when things get too heavy for [place], and we don't have strong supports outside of the hospital. Pt shared 2 of their healthy supports in their life aloud in the group.   Goal Area(s) Addressed:  Patient will identify 2 healthy supports in their life. Patient will identify skills to successfully complete activity. Patient will identify correlation of this activity to life post-discharge.  Patient will build on frustration tolerance skills. Patient will increase team building and communication skills.   Affect/Mood: Appropriate   Participation Level: Active and Engaged   Participation Quality: Independent   Behavior: Appropriate, Calm, and Cooperative   Speech/Thought Process: Coherent   Insight: Good   Judgement: Good   Modes of Intervention: Exploration, Guided Discussion, Reality Testing, and STEM Activity   Patient Response to Interventions:  Attentive, Engaged, Interested , and Receptive   Education Outcome:  Acknowledges education   Clinical Observations/Individualized Feedback: Julia Osborne was  active in their participation of session activities and group discussion. Pt identified "prayer" as her healthy support. Pt shared that she could not think of a second one. Pt successfully completed a straw structure with all given materials. Pt interacted well with LRT and peers duration of session.    Plan: Continue to engage patient in RT group sessions 2-3x/week.   Rosina Lowenstein, LRT, CTRS 11/08/2023 4:40 PM

## 2023-11-08 NOTE — Group Note (Signed)
Recreation Therapy Group Note   Group Topic:Health and Wellness  Group Date: 11/08/2023 Start Time: 1100 End Time: 1140 Facilitators: Rosina Lowenstein, LRT, CTRS Location: Dayroom  Group Description: Seated Exercise. LRT discussed the mental and physical benefits of exercise. LRT and group discussed how physical activity can be used as a coping skill. Pt's and LRT followed along to an exercise video on the TV screen that provided a visual representation and audio description of every exercise performed. Pt's encouraged to listen to their bodies and stop at any time if they experience feelings of discomfort or pain. Pts were encouraged to drink water and stay hydrated.   Goal Area(s) Addressed: Patient will learn benefits of physical activity. Patient will identify exercise as a coping skill.  Patient will follow multistep directions. Patient will try a new leisure interest.   Affect/Mood: Appropriate   Participation Level: Minimal    Clinical Observations/Individualized Feedback: Julia Osborne was present in the craft room at the time of group. Pt spent her time completing a puzzle and talking with a peer while in group.   Plan: Continue to engage patient in RT group sessions 2-3x/week.   Rosina Lowenstein, LRT, CTRS 11/08/2023 2:06 PM

## 2023-11-08 NOTE — Plan of Care (Signed)
  Problem: Activity: Goal: Interest or engagement in activities will improve Outcome: Progressing Goal: Sleeping patterns will improve Outcome: Progressing   Problem: Education: Goal: Emotional status will improve Outcome: Progressing Goal: Mental status will improve Outcome: Progressing   Problem: Coping: Goal: Ability to demonstrate self-control will improve Outcome: Progressing   Problem: Health Behavior/Discharge Planning: Goal: Compliance with treatment plan for underlying cause of condition will improve Outcome: Progressing   Problem: Safety: Goal: Periods of time without injury will increase Outcome: Progressing

## 2023-11-09 MED ORDER — TRAZODONE HCL 100 MG PO TABS: 100.0000 mg | ORAL_TABLET | Freq: Every evening | ORAL | Status: DC | PRN

## 2023-11-09 NOTE — Plan of Care (Signed)
  Problem: Activity: Goal: Interest or engagement in activities will improve Outcome: Progressing   Problem: Coping: Goal: Ability to verbalize frustrations and anger appropriately will improve Outcome: Progressing   Problem: Safety: Goal: Periods of time without injury will increase Outcome: Progressing   Problem: Education: Goal: Ability to make informed decisions regarding treatment will improve Outcome: Progressing   Problem: Coping: Goal: Coping ability will improve Outcome: Progressing

## 2023-11-09 NOTE — Group Note (Signed)
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 Patients and LRT discuss what it means to be "at peace", what it feels like physically and mentally. Pts are given a canvas and watercolor paint to use and encouraged to draw their idea of a peaceful place. Pts and LRT discuss how they use this in their daily life post discharge. Pts are encouraged to take their canvas home with them as a reminder to find their peaceful place whenever they are feeling depressed, anxious, etc.    Goal Area(s) Addressed:  Patient will identify what it means to experience a "peaceful" emotion. Patient will identify a new coping skill.  Patient will express their emotions through art. Patients will increase communication by talking with LRT and peers while in group.   Affect/Mood: Appropriate   Participation Level: Active and Engaged   Participation Quality: Independent   Behavior: Appropriate, Calm, and Cooperative   Speech/Thought Process: Coherent   Insight: Good   Judgement: Good   Modes of Intervention: Art, Exploration, Music, Open Conversation, and Support   Patient Response to Interventions:  Attentive, Engaged, Interested , and Receptive   Education Outcome:  Acknowledges education   Clinical Observations/Individualized Feedback: Julane was active in their participation of session activities and group discussion. Pt identified "being in the woods with trees and water" as her peaceful place. Pt appropriately painted her peaceful place. Pt interacted well with LRT and peers duration of session.    Plan: Continue to engage patient in RT group sessions 2-3x/week.   Rosina Lowenstein, LRT, CTRS 11/09/2023 4:23 PM

## 2023-11-09 NOTE — Group Note (Signed)
[facility] LCSW Group Therapy Note    Group Date: 11/09/2023 Start Time: 1300 End Time: 1400  Type of Therapy and Topic:  Group Therapy:  Overcoming Obstacles  Participation Level:  [facility] PARTICIPATION LEVEL: Active  Mood:  Description of Group:   In this group patients will be encouraged to explore what they see as obstacles to their own wellness and recovery. They will be guided to discuss their thoughts, feelings, and behaviors related to these obstacles. The group will process together ways to cope with barriers, with attention given to specific choices patients can make. Each patient will be challenged to identify changes they are motivated to make in order to overcome their obstacles. This group will be process-oriented, with patients participating in exploration of their own experiences as well as giving and receiving support and challenge from other group members.  Therapeutic Goals: 1. Patient will identify personal and current obstacles as they relate to admission. 2. Patient will identify barriers that currently interfere with their wellness or overcoming obstacles.  3. Patient will identify feelings, thought process and behaviors related to these barriers. 4. Patient will identify two changes they are willing to make to overcome these obstacles:    Summary of Patient Progress   Pt was active throughout group. Pt discussed that when her granddaughter died she was very angry and tore up the baby's crib.    Therapeutic Modalities:   Cognitive Behavioral Therapy Solution Focused Therapy Motivational Interviewing Relapse Prevention Therapy   Elza Rafter, LCSWA

## 2023-11-09 NOTE — Progress Notes (Signed)
   11/09/23 0558  15 Minute Checks  Location Bedroom  Visual Appearance Calm  Behavior Sleeping  Sleep (Behavioral Health Patients Only)  Calculate sleep? (Click Yes once per 24 hr at 0600 safety check) Yes  Documented sleep last 24 hours 6.75

## 2023-11-09 NOTE — Progress Notes (Signed)
[facility] MD Progress Note  11/09/2023  Julia Osborne  MRN:  [id number]  Julia Osborne is a 63 y.o. female admitted: Presented to the ED for suicidal attempt. She carries the psychiatric diagnoses of suicidal ideation, major depressive disorder, PTSD, GAD and has a past medical history of COPD, GERD, colon neoplasm, endometrial cancer, diverticulitis, Lynch syndrome.   Subjective:  Chart reviewed, case discussed in multidisciplinary meeting, patient seen during rounds.  Today during assessment patient endorsed "down" mood.  She continues to have passive suicidal ideation.  Patient denies any intention or plan to harm herself on the unit.  Patient said that she plans on surrendering her 5 dogs.  Patient also reports that she has talked to her son yesterday evening for him to move out of the house.  Patient said" he was not happy about it".Patient was provided with support and reassurance.  Patient denies side effects to medications. She denies auditory/visual hallucinations. Plan to continue current medication plan and monitor pt. patient was encouraged to attend group and work on coping strategies.  Sleep: Poor  Appetite:  Fair  Past Psychiatric History: see h&P Family History:  Family History  Problem Relation Age of Onset   Depression Mother    Other Mother        clot that went to heart, deceased age 23ss   Heart attack Father        age 9s, deceased   Stroke Father    Cancer Father        "at death determined he was ate up with cancer"   Hypertension Sister    Kidney cancer Sister 86   Diabetes Sister    Diabetes Brother    Hypertension Brother    Brain cancer Paternal Aunt    Cancer Paternal Uncle        NOS   Cancer Paternal Uncle        NOS   Cancer Paternal Uncle        NOS   Heart attack Maternal Grandfather    Endometriosis Daughter    Alcohol abuse Son    Drug abuse Son    Colon cancer Neg Hx    Social History:  Social History   Substance and Sexual Activity   Alcohol Use No     Social History   Substance and Sexual Activity  Drug Use No    Social History   Socioeconomic History   Marital status: Widowed    Spouse name: Not on file   Number of children: 2   Years of education: Not on file   Highest education level: Associate degree: occupational, Scientist, product/process development, or vocational program  Occupational History   Not on file  Tobacco Use   Smoking status: Never   Smokeless tobacco: Never  Vaping Use   Vaping status: Never Used  Substance and Sexual Activity   Alcohol use: No   Drug use: No   Sexual activity: Not Currently    Birth control/protection: Surgical    Comment: hyst  Other Topics Concern   Not on file  Social History Narrative   ** Merged History Encounter **       ** Data from: 04/28/23 Enc Dept: [facility]-PRE ADM TESTING   #  [place]ives [place]; smoking- 1/2 ppd; no alcohol; used to run machines;   FHx-Dad- MI; ? Cancer on autopsy; sisters/aunts- brain; lung cancer x2; oldest sister- kidney cancer  Pt has 7 year old son;daughter 33 years. Brothers- states to have s   poken  to her family re: importance of them being checked for lynch.        ** Data from: 05/04/23 Enc Dept: [facility]   Lives alone with 4 large dogs.    Social Drivers of Health   Financial Resource Strain: Medium Risk (12/07/2022)   Overall Financial Resource Strain (CARDIA)    Difficulty of Paying Living Expenses: Somewhat hard  Food Insecurity: Food Insecurity Present (11/03/2023)   Hunger Vital Sign    Worried About Running Out of Food in the Last Year: Sometimes true    Ran Out of Food in the Last Year: Sometimes true  Transportation Needs: Unmet Transportation Needs (11/03/2023)   PRAPARE - Transportation    Lack of Transportation (Medical): Yes    Lack of Transportation (Non-Medical): Yes  Physical Activity: Inactive (12/07/2022)   Exercise Vital Sign    Days of Exercise per Week: 0 days    Minutes of Exercise per Session: 0 min  Stress:  Stress Concern Present (12/07/2022)   [employer] of Occupational Health - Occupational Stress Questionnaire    Feeling of Stress : To some extent  Social Connections: Socially Isolated (11/03/2023)   Social Connection and Isolation Panel [NHANES]    Frequency of Communication with Friends and Family: More than three times a week    Frequency of Social Gatherings with Friends and Family: Once a week    Attends Religious Services: Never    Database administrator or Organizations: No    Attends Banker Meetings: Never    Marital Status: Widowed   Past Medical History:  Past Medical History:  Diagnosis Date   Abdominal pain, acute, left upper quadrant    Abdominal pain, epigastric    Abnormal CT scan, sigmoid colon    Abnormal vaginal Pap smear    Adenocarcinoma of colon (HCC) 09/13/2015   Partial colon resection and chemo tx's.    Anemia    Anxiety    Anxiety disorder, unspecified    Aphasia    Asthma    Cancer (HCC)    endometrial; cancer cells in intestine   Chronic pain of left knee    Colon neoplasm    Constipation    Conversion reaction    COPD (chronic obstructive pulmonary disease) (HCC)    no definite diagnosis   Depression    Diverticulitis    Diverticulosis of colon without hemorrhage    Dyspareunia 05/16/2015   Dysphasia    Endometrial cancer (HCC)    Esophageal dysphagia    Family history of cancer    Family history of kidney cancer    Ganglion of left knee    Gastric intestinal metaplasia    GERD (gastroesophageal reflux disease)    Glaucoma    Hematochezia    History of colonic polyps    Hyperlipidemia    Indigestion    Lynch syndrome    Mucosal abnormality of stomach    Nausea and vomiting    Neuropathy    feet and hands   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 drug (HCC)    PONV (postoperative nausea and vomiting)    Pre-diabetes    Primary osteoarthritis of left knee    Rectal bleeding    Reflux esophagitis    Soft tissue  swelling of knee joint    Uterine fibroid    UTI (urinary tract infection) 04/26/2023   on cipro   Vaginal dryness 05/16/2015   Vaginal itching    Vaginal Pap smear, abnormal  Past Surgical History:  Procedure Laterality Date   ABDOMINAL HYSTERECTOMY     APPENDECTOMY     BIOPSY N/A 03/14/2015   Procedure: BIOPSY;  Surgeon: Corbin Ade, MD;  Location: [facility];  Service: Endoscopy;  Laterality: N/A;  Gastric   COLONOSCOPY N/A 01/28/2017   Procedure: COLONOSCOPY;  Surgeon: Corbin Ade, MD;  Location: [facility];  Service: Endoscopy;  Laterality: N/A;  11:30am   COLONOSCOPY WITH PROPOFOL N/A 03/14/2015   RMR: Internal hemorrhoids. colonic diverticulosis. Incomplete examination. Prepartation inadequate.   COLONOSCOPY WITH PROPOFOL N/A 07/04/2015   RMR: Colonic diverticulosis . Large polypoid lesion in the vicinity of the hepatic flexure status post saline-assisted piecmeal snare polypectomy  with ablation and tattooing as described. Sigmoid polyp removed as described above. sigmoid colon polyp hyperplastic, hepatic flexure polyp with TA with focal high grade dysplasia    COLONOSCOPY WITH PROPOFOL N/A 11/03/2018   Procedure: COLONOSCOPY WITH PROPOFOL;  Surgeon: Wyline Mood, MD;  Location: [facility] ENDOSCOPY;  Service: Gastroenterology;  Laterality: N/A;   COLONOSCOPY WITH PROPOFOL N/A 12/09/2020   Procedure: COLONOSCOPY WITH PROPOFOL;  Surgeon: Wyline Mood, MD;  Location: [facility] ENDOSCOPY;  Service: Gastroenterology;  Laterality: N/A;  COVID POSITIVE 10/30/2020   COLONOSCOPY WITH PROPOFOL N/A 01/20/2021   Procedure: COLONOSCOPY WITH PROPOFOL;  Surgeon: Wyline Mood, MD;  Location: [facility] ENDOSCOPY;  Service: Gastroenterology;  Laterality: N/A;   COLONOSCOPY WITH PROPOFOL N/A 12/10/2021   Procedure: COLONOSCOPY WITH PROPOFOL;  Surgeon: Wyline Mood, MD;  Location: [facility] ENDOSCOPY;  Service: Gastroenterology;  Laterality: N/A;   COLONOSCOPY WITH PROPOFOL N/A 02/16/2023   Procedure: COLONOSCOPY  WITH PROPOFOL;  Surgeon: Wyline Mood, MD;  Location: [facility] ENDOSCOPY;  Service: Gastroenterology;  Laterality: N/A;   ENTEROSCOPY N/A 02/12/2020   Procedure: ENTEROSCOPY;  Surgeon: Wyline Mood, MD;  Location: [facility] ENDOSCOPY;  Service: Gastroenterology;  Laterality: N/A;   ESOPHAGEAL DILATION N/A 03/14/2015   Procedure: ESOPHAGEAL DILATION;  Surgeon: Corbin Ade, MD;  Location: [facility];  Service: Endoscopy;  Laterality: N/AElease Hashimoto 54   ESOPHAGOGASTRODUODENOSCOPY N/A 05/19/2016   Procedure: ESOPHAGOGASTRODUODENOSCOPY (EGD);  Surgeon: Corbin Ade, MD;  [facility];  Service: Endoscopy;  Laterality: N/A;  215   ESOPHAGOGASTRODUODENOSCOPY N/A 01/28/2017   Procedure: ESOPHAGOGASTRODUODENOSCOPY (EGD);  Surgeon: Corbin Ade, MD;  [facility];  Service: Endoscopy;  Laterality: N/A;   ESOPHAGOGASTRODUODENOSCOPY N/A 12/10/2021   Procedure: ESOPHAGOGASTRODUODENOSCOPY (EGD);  Surgeon: Wyline Mood, MD;  Location: [facility] ENDOSCOPY;  Service: Gastroenterology;  Laterality: N/A;   ESOPHAGOGASTRODUODENOSCOPY (EGD) WITH PROPOFOL N/A 03/14/2015   RMR: Mild erosive reflux esophagitis status post passage o f a Maloney dilator. Abnormal gastric mucosa of uncertain significance as described above. status post biopsy, benign   ESOPHAGOGASTRODUODENOSCOPY (EGD) WITH PROPOFOL N/A 11/18/2017   Procedure: ESOPHAGOGASTRODUODENOSCOPY (EGD) WITH PROPOFOL;  Surgeon: Corbin Ade, MD;  [facility];  Service: Endoscopy;  Laterality: N/A;  12:15pm   ESOPHAGOGASTRODUODENOSCOPY (EGD) WITH PROPOFOL N/A 11/03/2018   Procedure: ESOPHAGOGASTRODUODENOSCOPY (EGD) WITH PROPOFOL;  Surgeon: Wyline Mood, MD;  Location: [facility] ENDOSCOPY;  Service: Gastroenterology;  Laterality: N/A;   ESOPHAGOGASTRODUODENOSCOPY (EGD) WITH PROPOFOL N/A 02/16/2023   Procedure: ESOPHAGOGASTRODUODENOSCOPY (EGD) WITH PROPOFOL;  Surgeon: Wyline Mood, MD;  Location: [facility] ENDOSCOPY;  Service: Gastroenterology;  Laterality:  N/A;   GIVENS CAPSULE STUDY N/A 12/25/2019   Procedure: GIVENS CAPSULE STUDY;  Surgeon: Wyline Mood, MD;  Location: [facility] ENDOSCOPY;  Service: Gastroenterology;  Laterality: N/A;   MALONEY DILATION N/A 05/19/2016   Procedure: MALONEY DILATION;  Surgeon:  Corbin Ade, MD;  [facility];  Service: Endoscopy;  Laterality: N/A;   MALONEY DILATION N/A 01/28/2017   Procedure: [name] DILATION;  Surgeon: Corbin Ade, MD;  [facility];  Service: Endoscopy;  Laterality: N/A;   MALONEY DILATION N/A 11/18/2017   Procedure: [name] DILATION;  Surgeon: Corbin Ade, MD;  [facility];  Service: Endoscopy;  Laterality: N/A;   PARTIAL COLECTOMY  08/30/2015   polyp with adenocarcinoma   PARTIAL COLECTOMY  08/29/2018   POLYPECTOMY N/A 07/04/2015   Procedure: POLYPECTOMY;  Surgeon: Corbin Ade, MD;  Location: [facility];  Service: Endoscopy;  Laterality: N/A;   PORTACATH PLACEMENT Right 09/21/2014   TUBAL LIGATION      Current Medications: Current Facility-Administered Medications  Medication Dose Route Frequency Provider Last Rate Last Admin   acetaminophen (TYLENOL) tablet 650 mg  650 mg Oral Q6H PRN Saucier, Jerlyn Ly, NP   650 mg at 11/09/23 0912   albuterol (PROVENTIL) (2.5 MG/3ML) 0.083% nebulizer solution 3 mL  3 mL Inhalation Q2H PRN Verner Chol, MD       alum & mag hydroxide-simeth (MAALOX/MYLANTA) 200-200-20 MG/5ML suspension 30 mL  30 mL Oral Q4H PRN Saucier, Jerlyn Ly, NP       ARIPiprazole (ABILIFY) tablet 5 mg  5 mg Oral QHS Verner Chol, MD   5 mg at 11/08/23 2133   aspirin EC tablet 81 mg  81 mg Oral Daily Verner Chol, MD   81 mg at 11/09/23 0912   atorvastatin (LIPITOR) tablet 40 mg  40 mg Oral Daily Verner Chol, MD   40 mg at 11/09/23 0911   brimonidine (ALPHAGAN) 0.2 % ophthalmic solution 1 drop  1 drop Left Eye QHS Verner Chol, MD   1 drop at 11/08/23 2131   brinzolamide (AZOPT) 1 % ophthalmic suspension 1 drop  1 drop  Left Eye QHS Verner Chol, MD   1 drop at 11/08/23 2132   busPIRone (BUSPAR) tablet 7.5 mg  7.5 mg Oral BID Verner Chol, MD   7.5 mg at 11/09/23 4782   clopidogrel (PLAVIX) tablet 75 mg  75 mg Oral Daily Verner Chol, MD   75 mg at 11/09/23 0912   docusate sodium (COLACE) capsule 100 mg  100 mg Oral Daily PRN Verner Chol, MD       escitalopram (LEXAPRO) tablet 20 mg  20 mg Oral QHS Verner Chol, MD   20 mg at 11/08/23 2133   feeding supplement (ENSURE ENLIVE / ENSURE PLUS) liquid 237 mL  237 mL Oral BID BM Verner Chol, MD   237 mL at 11/07/23 0956   ferrous sulfate tablet 325 mg  325 mg Oral Alford Highland, MD   325 mg at 11/08/23 0943   fluticasone (FLONASE) 50 MCG/ACT nasal spray 1 spray  1 spray Each Nare Daily Verner Chol, MD   1 spray at 11/09/23 0920   gabapentin (NEURONTIN) capsule 100 mg  100 mg Oral Daily Verner Chol, MD   100 mg at 11/09/23 9562   gabapentin (NEURONTIN) capsule 200 mg  200 mg Oral QHS Verner Chol, MD   200 mg at 11/08/23 2133   hydrOXYzine (ATARAX) tablet 25 mg  25 mg Oral TID PRN Juliann Pares, NP   25 mg at 11/07/23 2134   latanoprost (XALATAN) 0.005 % ophthalmic solution 1 drop  1 drop Left Eye QHS Verner Chol, MD   1 drop at 11/08/23 2132   linaclotide (LINZESS) capsule 145 mcg  145 mcg Oral QAC breakfast Verner Chol, MD   145 mcg at 11/09/23 0912   magnesium hydroxide (MILK OF MAGNESIA) suspension 30 mL  30 mL Oral Daily PRN Saucier, Jerlyn Ly, NP       meclizine (ANTIVERT) tablet 25 mg  25 mg Oral TID PRN Verner Chol, MD       nicotine (NICODERM CQ - dosed in mg/24 hours) patch 14 mg  14 mg Transdermal Daily Verner Chol, MD   14 mg at 11/09/23 0920   OLANZapine (ZYPREXA) injection 5 mg  5 mg Intramuscular TID PRN Saucier, Jerlyn Ly, NP       OLANZapine zydis (ZYPREXA) disintegrating tablet 5 mg  5 mg Oral TID PRN Saucier, Jerlyn Ly, NP       ondansetron (ZOFRAN-ODT) disintegrating tablet 4 mg   4 mg Oral Q8H PRN Verner Chol, MD       pantoprazole (PROTONIX) EC tablet 40 mg  40 mg Oral QAC breakfast Verner Chol, MD   40 mg at 11/09/23 0912   senna-docusate (Senokot-S) tablet 2 tablet  2 tablet Oral Daily PRN Verner Chol, MD       traZODone (DESYREL) tablet 100 mg  100 mg Oral QHS PRN Lewanda Rife, MD        Lab Results:  No results found for this or any previous visit (from the past 48 hours).   Blood Alcohol level:  Lab Results  Component Value Date   ETH <10 11/03/2023   ETH <10 10/15/2023    Metabolic Disorder Labs: Lab Results  Component Value Date   HGBA1C 5.9 (H) 05/03/2023   MPG 123 05/03/2023   No results found for: "PROLACTIN" Lab Results  Component Value Date   CHOL 149 05/03/2023   TRIG 66 05/03/2023   HDL 49 05/03/2023   CHOLHDL 3.0 05/03/2023   VLDL 13 05/03/2023   LDLCALC 87 05/03/2023     Psychiatric Specialty Exam:  Presentation  General Appearance:  Appropriate for Environment; Casual  Eye Contact: Fair  Speech: Clear and Coherent  Speech Volume: Normal    Mood and Affect  Mood: "Down"  Affect: Depressed; Flat   Thought Process  Thought Processes: Coherent  Descriptions of Associations:Intact  Orientation:Full (Time, Place and Person)  Thought Content:Illogical; Rumination  Hallucinations:Denies  Ideas of Reference:None  Suicidal Thoughts: Passive SI Homicidal Thoughts:Denie3s   Sensorium  Memory: Immediate Fair; Recent Fair; Remote Fair  Judgment: Limited  Insight: Shallow   Executive Functions  Concentration: Fair  Attention Span: Fair  Recall: [employer] of Knowledge: Fair  Language: Fair   Psychomotor Activity  Psychomotor Activity: Decreased  Musculoskeletal: Strength & Muscle Tone: within normal limits Gait & Station: normal Assets  Assets: Manufacturing systems engineer; Desire for Improvement; Physical Health    Physical Exam: Physical Exam Vitals and  nursing note reviewed.  HENT:     Head: Normocephalic.     Right Ear: Tympanic membrane normal.     Mouth/Throat:     Mouth: Mucous membranes are moist.  Eyes:     Pupils: Pupils are equal, round, and reactive to light.  Cardiovascular:     Rate and Rhythm: Normal rate.     Pulses: Normal pulses.  Pulmonary:     Breath sounds: Normal breath sounds.  Abdominal:     General: Bowel sounds are normal.  Neurological:     Mental Status: She is alert.    Review of Systems  Constitutional: Negative.   HENT: Negative.    Eyes:  Negative.   Cardiovascular: Negative.   Gastrointestinal: Negative.   Skin: Negative.    Blood pressure (!) 108/57, pulse 63, temperature 98.2 F (36.8 C), resp. rate 18, height 5\' 3"  (1.6 m), weight 45.6 kg, SpO2 99%. Body mass index is 17.8 kg/m.  Diagnosis: Principal Problem:   Suicidal ideation Active Problems:   MDD (major depressive disorder), recurrent severe, without psychosis (HCC)   PLAN:Clinical Decision Making: Patient with history of depression and anxiety with recent inpatient psychiatric hospitalization few weeks ago, readmitted to the inpatient psychiatric unit after a suicide attempt with overdose on her medications with intent to die.  Patient reports multiple psychosocial stressors that includes living with her son who creates a hostile environment in the house and patient unable to handle her daily activities, taking care of 5 dogs with no help, worsening her depression and suicidal thoughts.  She needs inpatient hospitalization for further stabilization  Treatment Plan Summary:  Safety and Monitoring: Will consider initiating IVC if patient wants to leave AMA             -- Voluntary admission to inpatient psychiatric unit for safety, stabilization and treatment             -- Daily contact with patient to assess and evaluate symptoms and progress in treatment             -- Patient's case to be discussed in multi-disciplinary team  meeting             -- Observation Level: q15 minute checks             -- Vital signs:  q12 hours             -- Precautions: suicide, elopement, and assault   2. Psychiatric Diagnoses and Treatment:               Lexapro 20 mg gabapentin 100 in the morning and 200 at night Continue Abilify 5mg  nightly to boost up the antidepressant effect   Buspar 7.5 mg po BID Increased the dose of trazodone to 100 mg at bedtime to help with depression and insomnia, 11/09/23             -- Encouraged patient to participate in unit milieu and in scheduled group therapies                            3. Medical Issues Being Addressed:  No urgent medical needs are noted at this time   4. Discharge Planning:              -- Social work and case management to assist with discharge planning and identification of hospital follow-up needs prior to discharge             -- Estimated LOS: 5-7 days             -- Discharge Concerns: Need to establish a safety plan; Medication compliance and effectiveness             -- Discharge Goals: Return home with outpatient referrals follow ups  Lewanda Rife, MD

## 2023-11-09 NOTE — Group Note (Signed)
Recreation Therapy Group Note   Group Topic:Stress Management  Group Date: 11/09/2023 Start Time: 1100 End Time: 1130 Facilitators: Rosina Lowenstein, LRT, CTRS Location:  Dayroom  Group Description: Meditation. LRT and patients discussed what they know about meditation and mindfulness. LRT played a Deep Breathing Meditation exercise script for patients to follow along to. LRT and patients discussed how meditation and deep breathing can be used as a coping skill post--discharge to help manage symptoms of stress.   Goal Area(s) Addressed: Patient will practice using relaxation technique. Patient will identify a new coping skill.  Patient will follow multistep directions to reduce anxiety and stress.   Affect/Mood: Appropriate   Participation Level: Minimal   Participation Quality: Independent   Behavior: Cooperative   Speech/Thought Process: Coherent   Insight: Fair   Judgement: Fair    Modes of Intervention: Activity   Patient Response to Interventions:  Receptive   Education Outcome:  Acknowledges education   Clinical Observations/Individualized Feedback: Julia Osborne was somewhat active in their participation of session activities and group discussion.  Pt appropriately followed along to the prompt. Pt interacted well with LRT and peers duration of session.    Plan: Continue to engage patient in RT group sessions 2-3x/week.   Rosina Lowenstein, LRT, CTRS 11/09/2023 1:16 PM

## 2023-11-09 NOTE — Progress Notes (Signed)
   11/09/23 0724  Psych Admission Type (Psych Patients Only)  Admission Status Voluntary  Psychosocial Assessment  Patient Complaints Anxiety  Eye Contact Fair  Facial Expression Anxious  Affect Depressed  Speech Logical/coherent  Interaction Assertive  Motor Activity Slow  Appearance/Hygiene Unremarkable  Behavior Characteristics Cooperative  Mood Depressed  Thought Process  Coherency WDL  Content WDL  Delusions None reported or observed  Perception WDL  Hallucination None reported or observed  Judgment Impaired  Confusion None  Danger to Self  Current suicidal ideation? Denies  Danger to Others  Danger to Others None reported or observed

## 2023-11-09 NOTE — Plan of Care (Signed)
  Problem: Education: Goal: Knowledge of [facility] Education information/materials will improve Outcome: Progressing Goal: Emotional status will improve Outcome: Progressing Goal: Mental status will improve Outcome: Progressing Goal: Verbalization of understanding the information provided will improve Outcome: Progressing   Problem: Activity: Goal: Interest or engagement in activities will improve 11/09/2023 2255 by Jovita Gamma, RN Outcome: Progressing 11/09/2023 2255 by Jovita Gamma, RN Outcome: Progressing Goal: Sleeping patterns will improve Outcome: Progressing   Problem: Coping: Goal: Ability to verbalize frustrations and anger appropriately will improve 11/09/2023 2255 by Jovita Gamma, RN Outcome: Progressing 11/09/2023 2255 by Jovita Gamma, RN Outcome: Progressing Goal: Ability to demonstrate self-control will improve Outcome: Progressing

## 2023-11-09 NOTE — Plan of Care (Signed)
   Problem: Education: Goal: Emotional status will improve Outcome: Progressing Goal: Mental status will improve Outcome: Progressing   Problem: Activity: Goal: Interest or engagement in activities will improve Outcome: Progressing Goal: Sleeping patterns will improve Outcome: Progressing   Problem: Coping: Goal: Ability to verbalize frustrations and anger appropriately will improve Outcome: Progressing Goal: Ability to demonstrate self-control will improve Outcome: Progressing

## 2023-11-10 MED ORDER — METHOCARBAMOL 500 MG PO TABS
500.0000 mg | ORAL_TABLET | Freq: Three times a day (TID) | ORAL | Status: DC | PRN
  Administered 2023-11-10 – 2023-11-13 (×6): 500 mg via ORAL
  Filled 2023-11-10 (×6): qty 1

## 2023-11-10 NOTE — Progress Notes (Addendum)
Pt is A&O x4, medication compliant    11/09/23 2100  Psych Admission Type (Psych Patients Only)  Admission Status Voluntary  Psychosocial Assessment  Patient Complaints Anxiety  Eye Contact Fair  Facial Expression Anxious  Affect Sad  Speech Logical/coherent  Interaction Assertive  Motor Activity Slow  Appearance/Hygiene Unremarkable  Behavior Characteristics Cooperative;Appropriate to situation  Mood Pleasant  Thought Process  Coherency WDL  Content WDL  Delusions None reported or observed  Perception WDL  Hallucination None reported or observed  Judgment Impaired  Confusion None  Danger to Self  Current suicidal ideation? Denies  Agreement Not to Harm Self Yes  Description of Agreement verbal  Danger to Others  Danger to Others None reported or observed

## 2023-11-10 NOTE — BH IP Treatment Plan (Signed)
Interdisciplinary Treatment and Diagnostic Plan Update  11/10/2023 Time of Session: 11:00 AM  MADA SADIK MRN: [id number]  Principal Diagnosis: Suicidal ideation  Secondary Diagnoses: Principal Problem:   Suicidal ideation Active Problems:   MDD (major depressive disorder), recurrent severe, without psychosis (HCC)   Current Medications:  Current Facility-Administered Medications  Medication Dose Route Frequency Provider Last Rate Last Admin   acetaminophen (TYLENOL) tablet 650 mg  650 mg Oral Q6H PRN Saucier, Jerlyn Ly, NP   650 mg at 11/09/23 0912   albuterol (PROVENTIL) (2.5 MG/3ML) 0.083% nebulizer solution 3 mL  3 mL Inhalation Q2H PRN Verner Chol, MD       alum & mag hydroxide-simeth (MAALOX/MYLANTA) 200-200-20 MG/5ML suspension 30 mL  30 mL Oral Q4H PRN Saucier, Jerlyn Ly, NP       ARIPiprazole (ABILIFY) tablet 5 mg  5 mg Oral QHS Verner Chol, MD   5 mg at 11/09/23 2100   aspirin EC tablet 81 mg  81 mg Oral Daily Verner Chol, MD   81 mg at 11/10/23 0942   atorvastatin (LIPITOR) tablet 40 mg  40 mg Oral Daily Verner Chol, MD   40 mg at 11/10/23 0939   brimonidine (ALPHAGAN) 0.2 % ophthalmic solution 1 drop  1 drop Left Eye QHS Verner Chol, MD   1 drop at 11/09/23 2100   brinzolamide (AZOPT) 1 % ophthalmic suspension 1 drop  1 drop Left Eye QHS Verner Chol, MD   1 drop at 11/09/23 2100   busPIRone (BUSPAR) tablet 7.5 mg  7.5 mg Oral BID Verner Chol, MD   7.5 mg at 11/10/23 0941   clopidogrel (PLAVIX) tablet 75 mg  75 mg Oral Daily Verner Chol, MD   75 mg at 11/10/23 0940   docusate sodium (COLACE) capsule 100 mg  100 mg Oral Daily PRN Verner Chol, MD       escitalopram (LEXAPRO) tablet 20 mg  20 mg Oral QHS Verner Chol, MD   20 mg at 11/09/23 2100   feeding supplement (ENSURE ENLIVE / ENSURE PLUS) liquid 237 mL  237 mL Oral BID BM Verner Chol, MD   237 mL at 11/10/23 0947   ferrous sulfate tablet 325 mg  325 mg Oral Alford Highland, MD   325 mg at 11/10/23 0940   fluticasone (FLONASE) 50 MCG/ACT nasal spray 1 spray  1 spray Each Nare Daily Verner Chol, MD   1 spray at 11/10/23 4098   gabapentin (NEURONTIN) capsule 100 mg  100 mg Oral Daily Verner Chol, MD   100 mg at 11/10/23 0941   gabapentin (NEURONTIN) capsule 200 mg  200 mg Oral QHS Verner Chol, MD   200 mg at 11/09/23 2100   hydrOXYzine (ATARAX) tablet 25 mg  25 mg Oral TID PRN Juliann Pares, NP   25 mg at 11/07/23 2134   latanoprost (XALATAN) 0.005 % ophthalmic solution 1 drop  1 drop Left Eye QHS Verner Chol, MD   1 drop at 11/09/23 2100   linaclotide (LINZESS) capsule 145 mcg  145 mcg Oral QAC breakfast Verner Chol, MD   145 mcg at 11/09/23 0912   magnesium hydroxide (MILK OF MAGNESIA) suspension 30 mL  30 mL Oral Daily PRN Saucier, Jerlyn Ly, NP       meclizine (ANTIVERT) tablet 25 mg  25 mg Oral TID PRN Verner Chol, MD       methocarbamol (ROBAXIN) tablet 500 mg  500 mg Oral Q8H PRN Lewanda Rife, MD  nicotine (NICODERM CQ - dosed in mg/24 hours) patch 14 mg  14 mg Transdermal Daily Verner Chol, MD   14 mg at 11/10/23 0944   OLANZapine (ZYPREXA) injection 5 mg  5 mg Intramuscular TID PRN Saucier, Jerlyn Ly, NP       OLANZapine zydis (ZYPREXA) disintegrating tablet 5 mg  5 mg Oral TID PRN Saucier, Jerlyn Ly, NP       ondansetron (ZOFRAN-ODT) disintegrating tablet 4 mg  4 mg Oral Q8H PRN Verner Chol, MD       pantoprazole (PROTONIX) EC tablet 40 mg  40 mg Oral QAC breakfast Verner Chol, MD   40 mg at 11/10/23 1003   senna-docusate (Senokot-S) tablet 2 tablet  2 tablet Oral Daily PRN Verner Chol, MD       traZODone (DESYREL) tablet 100 mg  100 mg Oral QHS PRN Lewanda Rife, MD       PTA Medications: Medications Prior to Admission  Medication Sig Dispense Refill Last Dose/Taking   albuterol (PROVENTIL HFA;VENTOLIN HFA) 108 (90 Base) MCG/ACT inhaler Inhale 2 puffs into the lungs  every 2 (two) hours as needed for wheezing or shortness of breath (cough). 1 Inhaler 3    albuterol (PROVENTIL) (2.5 MG/3ML) 0.083% nebulizer solution Take 3 mLs (2.5 mg total) by nebulization every 6 (six) hours as needed for wheezing or shortness of breath. 75 mL 12    aspirin EC 81 MG tablet Take 1 tablet (81 mg total) by mouth daily. Swallow whole. 90 each 12    atorvastatin (LIPITOR) 40 MG tablet Take 1 tablet (40 mg total) by mouth daily. 30 tablet 0    brimonidine (ALPHAGAN) 0.2 % ophthalmic solution Place 1 drop into the left eye at bedtime. 20 each 12    brinzolamide (AZOPT) 1 % ophthalmic suspension Place 1 drop into the left eye at bedtime. 15 each 12    busPIRone (BUSPAR) 10 MG tablet Take 1 tablet (10 mg total) by mouth 2 (two) times daily. 60 tablet 0    clopidogrel (PLAVIX) 75 MG tablet Take 1 tablet (75 mg total) by mouth daily. 30 tablet 0    docusate sodium (COLACE) 100 MG capsule Take 1 capsule (100 mg total) by mouth 2 (two) times daily. (Patient not taking: Reported on 10/28/2023) 30 capsule 0    escitalopram (LEXAPRO) 20 MG tablet Take 1 tablet (20 mg total) by mouth at bedtime. 30 tablet 0    feeding supplement (ENSURE ENLIVE / ENSURE PLUS) LIQD Take 237 mLs by mouth 2 (two) times daily between meals. (Patient not taking: Reported on 10/28/2023) 237 mL 12    ferrous sulfate 325 (65 FE) MG tablet Take 1 tablet (325 mg total) by mouth every other day. 45 tablet 0    fluticasone (FLONASE) 50 MCG/ACT nasal spray Place 1 spray into both nostrils daily. 1 g 0    gabapentin (NEURONTIN) 100 MG capsule Take 1 capsule (100 mg total) by mouth daily. 30 capsule 0    gabapentin (NEURONTIN) 100 MG capsule Take 2 capsules (200 mg total) by mouth at bedtime. 60 capsule 0    latanoprost (XALATAN) 0.005 % ophthalmic solution Place 1 drop into the left eye at bedtime. 10 each 12    loratadine (CLARITIN) 10 MG tablet Take 10 mg by mouth daily.      meclizine (ANTIVERT) 25 MG tablet Take 1 tablet (25  mg total) by mouth 3 (three) times daily as needed for dizziness. (Patient not taking: Reported on 10/28/2023) 30 tablet  0    montelukast (SINGULAIR) 10 MG tablet Take 1 tablet (10 mg total) by mouth daily. 30 tablet 0    ondansetron (ZOFRAN-ODT) 4 MG disintegrating tablet 4mg  ODT q4 hours prn nausea/vomit 12 tablet 0    pantoprazole (PROTONIX) 40 MG tablet Take 1 tablet (40 mg total) by mouth daily before breakfast. 15 tablet 0    Plecanatide (TRULANCE) 3 MG TABS Take 1 tablet by mouth daily. 30 tablet 6    senna-docusate (SENOKOT-S) 8.6-50 MG tablet Take 2 tablets by mouth 2 (two) times daily. 30 tablet 0    sodium chloride (OCEAN) 0.65 % nasal spray Place into the nose.      traZODone (DESYREL) 50 MG tablet Take 1 tablet (50 mg total) by mouth at bedtime. 30 tablet 0     Patient Stressors:    Patient Strengths:    Treatment Modalities: Medication Management, Group therapy, Case management,  1 to 1 session with clinician, Psychoeducation, Recreational therapy.   Physician Treatment Plan for Primary Diagnosis: Suicidal ideation Long Term Goal(s): Improvement in symptoms so as ready for discharge   Short Term Goals: Ability to identify changes in lifestyle to reduce recurrence of condition will improve Ability to verbalize feelings will improve Ability to disclose and discuss suicidal ideas Ability to demonstrate self-control will improve Ability to identify and develop effective coping behaviors will improve Ability to maintain clinical measurements within normal limits will improve Compliance with prescribed medications will improve Ability to identify triggers associated with substance abuse/mental health issues will improve  Medication Management: Evaluate patient's response, side effects, and tolerance of medication regimen.  Therapeutic Interventions: 1 to 1 sessions, Unit Group sessions and Medication administration.  Evaluation of Outcomes: Progressing  Physician Treatment  Plan for Secondary Diagnosis: Principal Problem:   Suicidal ideation Active Problems:   MDD (major depressive disorder), recurrent severe, without psychosis (HCC)  Long Term Goal(s): Improvement in symptoms so as ready for discharge   Short Term Goals: Ability to identify changes in lifestyle to reduce recurrence of condition will improve Ability to verbalize feelings will improve Ability to disclose and discuss suicidal ideas Ability to demonstrate self-control will improve Ability to identify and develop effective coping behaviors will improve Ability to maintain clinical measurements within normal limits will improve Compliance with prescribed medications will improve Ability to identify triggers associated with substance abuse/mental health issues will improve     Medication Management: Evaluate patient's response, side effects, and tolerance of medication regimen.  Therapeutic Interventions: 1 to 1 sessions, Unit Group sessions and Medication administration.  Evaluation of Outcomes: Progressing   RN Treatment Plan for Primary Diagnosis: Suicidal ideation Long Term Goal(s): Knowledge of disease and therapeutic regimen to maintain health will improve  Short Term Goals: Ability to remain free from injury will improve, Ability to verbalize frustration and anger appropriately will improve, Ability to demonstrate self-control, Ability to participate in decision making will improve, Ability to verbalize feelings will improve, Ability to disclose and discuss suicidal ideas, Ability to identify and develop effective coping behaviors will improve, and Compliance with prescribed medications will improve  Medication Management: RN will administer medications as ordered by provider, will assess and evaluate patient's response and provide education to patient for prescribed medication. RN will report any adverse and/or side effects to prescribing provider.  Therapeutic Interventions: 1 on 1  counseling sessions, Psychoeducation, Medication administration, Evaluate responses to treatment, Monitor vital signs and CBGs as ordered, Perform/monitor CIWA, COWS, AIMS and Fall Risk screenings as ordered, Perform wound  care treatments as ordered.  Evaluation of Outcomes: Progressing   LCSW Treatment Plan for Primary Diagnosis: Suicidal ideation Long Term Goal(s): Safe transition to appropriate next level of care at discharge, Engage patient in therapeutic group addressing interpersonal concerns.  Short Term Goals: Engage patient in aftercare planning with referrals and resources, Increase social support, Increase ability to appropriately verbalize feelings, Increase emotional regulation, Facilitate acceptance of mental health diagnosis and concerns, Facilitate patient progression through stages of change regarding substance use diagnoses and concerns, Identify triggers associated with mental health/substance abuse issues, and Increase skills for wellness and recovery  Therapeutic Interventions: Assess for all discharge needs, 1 to 1 time with Social worker, Explore available resources and support systems, Assess for adequacy in community support network, Educate family and significant other(s) on suicide prevention, Complete Psychosocial Assessment, Interpersonal group therapy.  Evaluation of Outcomes: Progressing   Progress in Treatment: Attending groups: Yes. Participating in groups: Yes. Taking medication as prescribed: Yes. Toleration medication: Yes. Family/Significant other contact made: No, will contact:  CSW will contact if given permission  Patient understands diagnosis: Yes. Discussing patient identified problems/goals with staff: Yes. Medical problems stabilized or resolved: Yes. Denies suicidal/homicidal ideation: Yes. Issues/concerns per patient self-inventory: No. Other: None   New problem(s) identified: No, Describe:  None identified Update 11/10/23: No changes at this  time    New Short Term/Long Term Goal(s):   elimination of symptoms of psychosis, medication management for mood stabilization; elimination of SI thoughts; development of comprehensive mental wellness plan. Update 11/10/23: No changes at this time   Patient Goals:  " Just to get to feeling better" Update 11/10/23: No changes at this time   Discharge Plan or Barriers: CSW will assist with appropriate discharge planning Update 11/10/23: No changes at this time   Reason for Continuation of Hospitalization: Depression Medication stabilization   Estimated Length of Stay: 1 to 7 days Update 11/10/23: 11/12/23  Last 3 [place] Suicide Severity Risk Score: Flowsheet Row Admission (Current) from 11/03/2023 in [facility] BEHAVIORAL MEDICINE Most recent reading at 11/03/2023  9:20 PM ED from 11/03/2023 in [facility] Emergency Department at [facility] Most recent reading at 11/03/2023  3:24 PM ED from 11/01/2023 in [facility] Emergency Department at [facility] Most recent reading at 11/01/2023 12:57 PM  C-SSRS RISK CATEGORY High Risk High Risk No Risk       Last PHQ 2/9 Scores:    10/28/2023    5:16 PM 12/07/2022   10:35 AM  Depression screen PHQ 2/9  Decreased Interest 2 2  Down, Depressed, Hopeless 1 2  PHQ - 2 Score 3 4  Altered sleeping 2 1  Tired, decreased energy 1 3  Change in appetite 1 1  Feeling bad or failure about yourself  2 1  Trouble concentrating 1 1  Moving slowly or fidgety/restless 1 0  Suicidal thoughts 0 0  PHQ-9 Score 11 11  Difficult doing work/chores Somewhat difficult Somewhat difficult    Scribe for Treatment Team: Elza Rafter, Theresia Majors 11/10/2023 2:22 PM

## 2023-11-10 NOTE — Progress Notes (Signed)
Patient is a voluntary admission to [facility] for MDD and suicide attempt.  Patient currently denies SI, HI, AVH, anxiety and depression.  C/O pain today and new order for Robaxin ordered and given. Patient is calm, cooperative, with staff and peers. Also friendly with staff and peers. Participates in group activities. Performs all ADL's.  Will continue to monitor.

## 2023-11-10 NOTE — Group Note (Signed)
Recreation Therapy Group Note   Group Topic:Relaxation  Group Date: 11/10/2023 Start Time: 1100 End Time: 1135 Facilitators: Rosina Lowenstein, LRT, CTRS Location:  Dayroom  Group Description: PMR (Progressive Muscle Relaxation). LRT educates patients on what PMR is and the benefits that come from it. Patients are asked to sit with their feet flat on the floor while sitting up and all the way back in their chair, if possible. LRT and pts follow a prompt through a speaker that requires you to tense and release different muscles in their body and focus on their breathing. During session, lights are off and soft music is being played.  Goal Area(s) Addressed:  Patients will be able to describe progressive muscle relaxation.  Patient will practice using relaxation technique. Patient will identify a new coping skill.  Patient will follow multistep directions to reduce anxiety and stress.   Affect/Mood: N/A   Participation Level: Did not attend    Clinical Observations/Individualized Feedback: Patient did not attend group.   Plan: Continue to engage patient in RT group sessions 2-3x/week.   Rosina Lowenstein, LRT, CTRS 11/10/2023 12:27 PM

## 2023-11-10 NOTE — Progress Notes (Signed)
Physical Therapy Group Note  Group Topic:  Home Safety Modifications for Fall Prevention, Fall Recovery Technique Group Date: 11/10/2023 Group Time (start and end): 2956-2130 Facilitators:  Cephus Slater, PT; Elly Modena, PT  Group Description: Group discussed various home safety modifications to optimize safety, minimize fall risk in home environment.  Brainstormed and discussed opportunities throughout specific rooms/spaces of the home (entryway, kitchen, bathroom, bedroom and main living spaces), encouraging active participation with ideas and suggestions throughout session.  Provided handout with home safety checklist ([employer] on Aging) for reference outside of group time.  Encouraged each participant to voice a specific modification that he/she could implement at discharge to improve safety of his/her own home environment.  Additionally, reviewed strategy/technique and safety considerations for fall recovery.  Reviewed importance of calling emergency services as necessary and waiting for assistance to arrive if injury suspected.  If injury ruled out, did review fall recovery technique, transitioning from supine --> quadruped --> tall kneeling --> standing while using solid support surface for stabilization.  Therapist demonstrated technique, encouraging group participants to problem-solve and sequence correct technique.  Therapeutic Goal(s): Identify and discuss home safety modifications for primary living spaces of home environment. Identify and discuss fall recovery techniques.   Individual Participation:  Patient present for and participated with group.  Spoke and interacted appropriately when addressed, but with minimal instances of spontaneous conversation/participation.   Participation Level and Quality: Appropriate    Behavior: Appropriate, slightly distracted (by outside environment)   Speech/Thought Process: Clear, appropriate   Affect/Mood: Slightly  distractible, withdrawn   Insight: Fair/good   Judgement: Fair/good   Modes of Intervention: Verbal discussion, demonstration, handout provided     Plan: Continue to engage patient in PT/OT groups 1-2x/week.  [facility] H. Manson Passey, PT, DPT, NCS 11/10/23, 2:50 PM [phone]

## 2023-11-10 NOTE — [facility] Counselor (Signed)
CSW contacted [facility] [place] to see if pt would qualify for outpatient services.   [facility] reports for initial appointment pt would have to walk-in.   CSW will inform pt as pt reports that transportation is an issue because she is currently sharing a car with her son.   CSW will put [facility] as an option on AVS in case pt does have access to car and would like to go.   Reynaldo Minium, MSW, [place] 11/10/2023 2:00 PM

## 2023-11-10 NOTE — Group Note (Signed)
Recreation Therapy Group Note   Group Topic:Other  Group Date: 11/10/2023 Start Time: 1400 End Time: 1445 Facilitators: Rosina Lowenstein, LRT, CTRS Location:  Dayroom  Activity Description/Intervention: Therapeutic Drumming. Patients with peers and staff were given the opportunity to engage in a leader facilitated [employer] [facility] Empowerment Drumming Circle with staff from the [employer], in partnership with [employer]. Teaching laboratory technician and trained [employer], Theodoro Doing leading with LRT observing and documenting intervention and pt response. This evidenced-based practice targets 7 areas of health and wellbeing in the human experience including: stress-reduction, exercise, self-expression, camaraderie/support, nurturing, spirituality, and music-making (leisure).    Goal Area(s) Addresses:  Patient will engage in pro-social way in music group.  Patient will follow directions of drum leader on the first prompt. Patient will demonstrate no behavioral issues during group.  Patient will identify if a reduction in stress level occurs as a result of participation in therapeutic drum circle.     Affect/Mood: Appropriate   Participation Level: Active   Participation Quality: Independent   Behavior: Appropriate   Speech/Thought Process: Coherent   Insight: Fair   Judgement: Fair    Modes of Intervention: Activity and Music   Patient Response to Interventions:  Engaged   Education Outcome:  Acknowledges education   Clinical Observations/Individualized Feedback: Rhyen was active in their participation of session activities and group discussion. Pt followed along appropriately while interacting well with LRT and peers duration of session.     Plan: Continue to engage patient in RT group sessions 2-3x/week.   Rosina Lowenstein, LRT, CTRS 11/10/2023 2:52 PM

## 2023-11-10 NOTE — Progress Notes (Signed)
[facility] MD Progress Note  11/10/2023  Julia Osborne  MRN:  [id number]  Julia Osborne is a 63 y.o. female admitted: Presented to the ED for suicidal attempt. She carries the psychiatric diagnoses of suicidal ideation, major depressive disorder, PTSD, GAD and has a past medical history of COPD, GERD, colon neoplasm, endometrial cancer, diverticulitis, Lynch syndrome.   Subjective:  Chart reviewed, case discussed in multidisciplinary meeting, patient seen during rounds.  Today during assessment patient endorsed "fine" mood.  She she reports she had talked to her son about her son moving out of the patient's house.  Patient said that" I have finally told him things had to change, he needs to move out".  Patient reports that she feels little better after disclosing this to her son.  Patient reports suicidal thoughts are "not bad".  Patient denies any intention or plan to harm herself on the unit.  Patient said that she plans on surrendering her 5 dogs.  She reports she is going to make a few phone call today to figure this out. Patient was provided with support and reassurance.  Patient denies side effects to medications. She denies auditory/visual hallucinations. Plan to continue current medication plan and monitor pt. patient was encouraged to attend group and work on coping strategies.  Sleep: Fair  Appetite:  Fair  Past Psychiatric History: see h&P Family History:  Family History  Problem Relation Age of Onset   Depression Mother    Other Mother        clot that went to heart, deceased age 81ss   Heart attack Father        age 23s, deceased   Stroke Father    Cancer Father        "at death determined he was ate up with cancer"   Hypertension Sister    Kidney cancer Sister 15   Diabetes Sister    Diabetes Brother    Hypertension Brother    Brain cancer Paternal Aunt    Cancer Paternal Uncle        NOS   Cancer Paternal Uncle        NOS   Cancer Paternal Uncle        NOS   Heart attack  Maternal Grandfather    Endometriosis Daughter    Alcohol abuse Son    Drug abuse Son    Colon cancer Neg Hx    Social History:  Social History   Substance and Sexual Activity  Alcohol Use No     Social History   Substance and Sexual Activity  Drug Use No    Social History   Socioeconomic History   Marital status: Widowed    Spouse name: Not on file   Number of children: 2   Years of education: Not on file   Highest education level: Associate degree: occupational, Scientist, product/process development, or vocational program  Occupational History   Not on file  Tobacco Use   Smoking status: Never   Smokeless tobacco: Never  Vaping Use   Vaping status: Never Used  Substance and Sexual Activity   Alcohol use: No   Drug use: No   Sexual activity: Not Currently    Birth control/protection: Surgical    Comment: hyst  Other Topics Concern   Not on file  Social History Narrative   ** Merged History Encounter **       ** Data from: 04/28/23 Enc Dept: [facility]-PRE ADM TESTING   #  [place]ives [place]; smoking- 1/2  ppd; no alcohol; used to run machines;   FHx-Dad- MI; ? Cancer on autopsy; sisters/aunts- brain; lung cancer x2; oldest sister- kidney cancer  Pt has 46 year old son;daughter 33 years. Brothers- states to have s   poken to her family re: importance of them being checked for lynch.        ** Data from: 05/04/23 Enc Dept: [facility]   Lives alone with 4 large dogs.    Social Drivers of Health   Financial Resource Strain: Medium Risk (12/07/2022)   Overall Financial Resource Strain (CARDIA)    Difficulty of Paying Living Expenses: Somewhat hard  Food Insecurity: Food Insecurity Present (11/03/2023)   Hunger Vital Sign    Worried About Running Out of Food in the Last Year: Sometimes true    Ran Out of Food in the Last Year: Sometimes true  Transportation Needs: Unmet Transportation Needs (11/03/2023)   PRAPARE - Transportation    Lack of Transportation (Medical): Yes    Lack of  Transportation (Non-Medical): Yes  Physical Activity: Inactive (12/07/2022)   Exercise Vital Sign    Days of Exercise per Week: 0 days    Minutes of Exercise per Session: 0 min  Stress: Stress Concern Present (12/07/2022)   [employer] of Occupational Health - Occupational Stress Questionnaire    Feeling of Stress : To some extent  Social Connections: Socially Isolated (11/03/2023)   Social Connection and Isolation Panel [NHANES]    Frequency of Communication with Friends and Family: More than three times a week    Frequency of Social Gatherings with Friends and Family: Once a week    Attends Religious Services: Never    Database administrator or Organizations: No    Attends Banker Meetings: Never    Marital Status: Widowed   Past Medical History:  Past Medical History:  Diagnosis Date   Abdominal pain, acute, left upper quadrant    Abdominal pain, epigastric    Abnormal CT scan, sigmoid colon    Abnormal vaginal Pap smear    Adenocarcinoma of colon (HCC) 09/13/2015   Partial colon resection and chemo tx's.    Anemia    Anxiety    Anxiety disorder, unspecified    Aphasia    Asthma    Cancer (HCC)    endometrial; cancer cells in intestine   Chronic pain of left knee    Colon neoplasm    Constipation    Conversion reaction    COPD (chronic obstructive pulmonary disease) (HCC)    no definite diagnosis   Depression    Diverticulitis    Diverticulosis of colon without hemorrhage    Dyspareunia 05/16/2015   Dysphasia    Endometrial cancer (HCC)    Esophageal dysphagia    Family history of cancer    Family history of kidney cancer    Ganglion of left knee    Gastric intestinal metaplasia    GERD (gastroesophageal reflux disease)    Glaucoma    Hematochezia    History of colonic polyps    Hyperlipidemia    Indigestion    Lynch syndrome    Mucosal abnormality of stomach    Nausea and vomiting    Neuropathy    feet and hands   Neuropathy due to  chemotherapeutic drug (HCC)    PONV (postoperative nausea and vomiting)    Pre-diabetes    Primary osteoarthritis of left knee    Rectal bleeding    Reflux esophagitis    Soft tissue swelling  of knee joint    Uterine fibroid    UTI (urinary tract infection) 04/26/2023   on cipro   Vaginal dryness 05/16/2015   Vaginal itching    Vaginal Pap smear, abnormal     Past Surgical History:  Procedure Laterality Date   ABDOMINAL HYSTERECTOMY     APPENDECTOMY     BIOPSY N/A 03/14/2015   Procedure: BIOPSY;  Surgeon: Corbin Ade, MD;  Location: [facility];  Service: Endoscopy;  Laterality: N/A;  Gastric   COLONOSCOPY N/A 01/28/2017   Procedure: COLONOSCOPY;  Surgeon: Corbin Ade, MD;  Location: [facility];  Service: Endoscopy;  Laterality: N/A;  11:30am   COLONOSCOPY WITH PROPOFOL N/A 03/14/2015   RMR: Internal hemorrhoids. colonic diverticulosis. Incomplete examination. Prepartation inadequate.   COLONOSCOPY WITH PROPOFOL N/A 07/04/2015   RMR: Colonic diverticulosis . Large polypoid lesion in the vicinity of the hepatic flexure status post saline-assisted piecmeal snare polypectomy  with ablation and tattooing as described. Sigmoid polyp removed as described above. sigmoid colon polyp hyperplastic, hepatic flexure polyp with TA with focal high grade dysplasia    COLONOSCOPY WITH PROPOFOL N/A 11/03/2018   Procedure: COLONOSCOPY WITH PROPOFOL;  Surgeon: Wyline Mood, MD;  Location: [facility] ENDOSCOPY;  Service: Gastroenterology;  Laterality: N/A;   COLONOSCOPY WITH PROPOFOL N/A 12/09/2020   Procedure: COLONOSCOPY WITH PROPOFOL;  Surgeon: Wyline Mood, MD;  Location: [facility] ENDOSCOPY;  Service: Gastroenterology;  Laterality: N/A;  COVID POSITIVE 10/30/2020   COLONOSCOPY WITH PROPOFOL N/A 01/20/2021   Procedure: COLONOSCOPY WITH PROPOFOL;  Surgeon: Wyline Mood, MD;  Location: [facility] ENDOSCOPY;  Service: Gastroenterology;  Laterality: N/A;   COLONOSCOPY WITH PROPOFOL N/A 12/10/2021   Procedure:  COLONOSCOPY WITH PROPOFOL;  Surgeon: Wyline Mood, MD;  Location: [facility] ENDOSCOPY;  Service: Gastroenterology;  Laterality: N/A;   COLONOSCOPY WITH PROPOFOL N/A 02/16/2023   Procedure: COLONOSCOPY WITH PROPOFOL;  Surgeon: Wyline Mood, MD;  Location: [facility] ENDOSCOPY;  Service: Gastroenterology;  Laterality: N/A;   ENTEROSCOPY N/A 02/12/2020   Procedure: ENTEROSCOPY;  Surgeon: Wyline Mood, MD;  Location: [facility] ENDOSCOPY;  Service: Gastroenterology;  Laterality: N/A;   ESOPHAGEAL DILATION N/A 03/14/2015   Procedure: ESOPHAGEAL DILATION;  Surgeon: Corbin Ade, MD;  Location: [facility];  Service: Endoscopy;  Laterality: N/AElease Hashimoto 54   ESOPHAGOGASTRODUODENOSCOPY N/A 05/19/2016   Procedure: ESOPHAGOGASTRODUODENOSCOPY (EGD);  Surgeon: Corbin Ade, MD;  [facility]: [facility];  Service: Endoscopy;  Laterality: N/A;  215   ESOPHAGOGASTRODUODENOSCOPY N/A 01/28/2017   Procedure: ESOPHAGOGASTRODUODENOSCOPY (EGD);  Surgeon: Corbin Ade, MD;  [facility];  Service: Endoscopy;  Laterality: N/A;   ESOPHAGOGASTRODUODENOSCOPY N/A 12/10/2021   Procedure: ESOPHAGOGASTRODUODENOSCOPY (EGD);  Surgeon: Wyline Mood, MD;  Location: [facility] ENDOSCOPY;  Service: Gastroenterology;  Laterality: N/A;   ESOPHAGOGASTRODUODENOSCOPY (EGD) WITH PROPOFOL N/A 03/14/2015   RMR: Mild erosive reflux esophagitis status post passage o f a Maloney dilator. Abnormal gastric mucosa of uncertain significance as described above. status post biopsy, benign   ESOPHAGOGASTRODUODENOSCOPY (EGD) WITH PROPOFOL N/A 11/18/2017   Procedure: ESOPHAGOGASTRODUODENOSCOPY (EGD) WITH PROPOFOL;  Surgeon: Corbin Ade, MD;  [facility];  Service: Endoscopy;  Laterality: N/A;  12:15pm   ESOPHAGOGASTRODUODENOSCOPY (EGD) WITH PROPOFOL N/A 11/03/2018   Procedure: ESOPHAGOGASTRODUODENOSCOPY (EGD) WITH PROPOFOL;  Surgeon: Wyline Mood, MD;  Location: [facility] ENDOSCOPY;  Service: Gastroenterology;  Laterality: N/A;    ESOPHAGOGASTRODUODENOSCOPY (EGD) WITH PROPOFOL N/A 02/16/2023   Procedure: ESOPHAGOGASTRODUODENOSCOPY (EGD) WITH PROPOFOL;  Surgeon: Wyline Mood, MD;  Location: [facility] ENDOSCOPY;  Service: Gastroenterology;  Laterality: N/A;  GIVENS CAPSULE STUDY N/A 12/25/2019   Procedure: GIVENS CAPSULE STUDY;  Surgeon: Wyline Mood, MD;  Location: [facility] ENDOSCOPY;  Service: Gastroenterology;  Laterality: N/A;   MALONEY DILATION N/A 05/19/2016   Procedure: [name] DILATION;  Surgeon: Corbin Ade, MD;  [facility];  Service: Endoscopy;  Laterality: N/A;   MALONEY DILATION N/A 01/28/2017   Procedure: [name] DILATION;  Surgeon: Corbin Ade, MD;  Location[facility];  Service: Endoscopy;  Laterality: N/A;   MALONEY DILATION N/A 11/18/2017   Procedure: [name] DILATION;  Surgeon: Corbin Ade, MD;  Location[facility];  Service: Endoscopy;  Laterality: N/A;   PARTIAL COLECTOMY  08/30/2015   polyp with adenocarcinoma   PARTIAL COLECTOMY  08/29/2018   POLYPECTOMY N/A 07/04/2015   Procedure: POLYPECTOMY;  Surgeon: Corbin Ade, MD;  Location: [facility];  Service: Endoscopy;  Laterality: N/A;   PORTACATH PLACEMENT Right 09/21/2014   TUBAL LIGATION      Current Medications: Current Facility-Administered Medications  Medication Dose Route Frequency Provider Last Rate Last Admin   acetaminophen (TYLENOL) tablet 650 mg  650 mg Oral Q6H PRN Saucier, Jerlyn Ly, NP   650 mg at 11/09/23 0912   albuterol (PROVENTIL) (2.5 MG/3ML) 0.083% nebulizer solution 3 mL  3 mL Inhalation Q2H PRN Verner Chol, MD       alum & mag hydroxide-simeth (MAALOX/MYLANTA) 200-200-20 MG/5ML suspension 30 mL  30 mL Oral Q4H PRN Saucier, Jerlyn Ly, NP       ARIPiprazole (ABILIFY) tablet 5 mg  5 mg Oral QHS Verner Chol, MD   5 mg at 11/09/23 2100   aspirin EC tablet 81 mg  81 mg Oral Daily Verner Chol, MD   81 mg at 11/10/23 0942   atorvastatin (LIPITOR) tablet 40 mg  40 mg Oral Daily Verner Chol, MD    40 mg at 11/10/23 0939   brimonidine (ALPHAGAN) 0.2 % ophthalmic solution 1 drop  1 drop Left Eye QHS Verner Chol, MD   1 drop at 11/09/23 2100   brinzolamide (AZOPT) 1 % ophthalmic suspension 1 drop  1 drop Left Eye QHS Verner Chol, MD   1 drop at 11/09/23 2100   busPIRone (BUSPAR) tablet 7.5 mg  7.5 mg Oral BID Verner Chol, MD   7.5 mg at 11/10/23 0941   clopidogrel (PLAVIX) tablet 75 mg  75 mg Oral Daily Verner Chol, MD   75 mg at 11/10/23 0940   docusate sodium (COLACE) capsule 100 mg  100 mg Oral Daily PRN Verner Chol, MD       escitalopram (LEXAPRO) tablet 20 mg  20 mg Oral QHS Verner Chol, MD   20 mg at 11/09/23 2100   feeding supplement (ENSURE ENLIVE / ENSURE PLUS) liquid 237 mL  237 mL Oral BID BM Verner Chol, MD   237 mL at 11/10/23 0947   ferrous sulfate tablet 325 mg  325 mg Oral Alford Highland, MD   325 mg at 11/10/23 0940   fluticasone (FLONASE) 50 MCG/ACT nasal spray 1 spray  1 spray Each Nare Daily Verner Chol, MD   1 spray at 11/10/23 1610   gabapentin (NEURONTIN) capsule 100 mg  100 mg Oral Daily Verner Chol, MD   100 mg at 11/10/23 0941   gabapentin (NEURONTIN) capsule 200 mg  200 mg Oral QHS Verner Chol, MD   200 mg at 11/09/23 2100   hydrOXYzine (ATARAX) tablet 25 mg  25 mg Oral TID PRN Saucier, Jerlyn Ly, NP  25 mg at 11/07/23 2134   latanoprost (XALATAN) 0.005 % ophthalmic solution 1 drop  1 drop Left Eye QHS Verner Chol, MD   1 drop at 11/09/23 2100   linaclotide (LINZESS) capsule 145 mcg  145 mcg Oral QAC breakfast Verner Chol, MD   145 mcg at 11/09/23 0912   magnesium hydroxide (MILK OF MAGNESIA) suspension 30 mL  30 mL Oral Daily PRN Saucier, Jerlyn Ly, NP       meclizine (ANTIVERT) tablet 25 mg  25 mg Oral TID PRN Verner Chol, MD       nicotine (NICODERM CQ - dosed in mg/24 hours) patch 14 mg  14 mg Transdermal Daily Verner Chol, MD   14 mg at 11/10/23 0944   OLANZapine (ZYPREXA) injection 5 mg  5  mg Intramuscular TID PRN Saucier, Jerlyn Ly, NP       OLANZapine zydis (ZYPREXA) disintegrating tablet 5 mg  5 mg Oral TID PRN Saucier, Jerlyn Ly, NP       ondansetron (ZOFRAN-ODT) disintegrating tablet 4 mg  4 mg Oral Q8H PRN Verner Chol, MD       pantoprazole (PROTONIX) EC tablet 40 mg  40 mg Oral QAC breakfast Verner Chol, MD   40 mg at 11/10/23 1003   senna-docusate (Senokot-S) tablet 2 tablet  2 tablet Oral Daily PRN Verner Chol, MD       traZODone (DESYREL) tablet 100 mg  100 mg Oral QHS PRN Lewanda Rife, MD        Lab Results:  No results found for this or any previous visit (from the past 48 hours).   Blood Alcohol level:  Lab Results  Component Value Date   ETH <10 11/03/2023   ETH <10 10/15/2023    Metabolic Disorder Labs: Lab Results  Component Value Date   HGBA1C 5.9 (H) 05/03/2023   MPG 123 05/03/2023   No results found for: "PROLACTIN" Lab Results  Component Value Date   CHOL 149 05/03/2023   TRIG 66 05/03/2023   HDL 49 05/03/2023   CHOLHDL 3.0 05/03/2023   VLDL 13 05/03/2023   LDLCALC 87 05/03/2023     Psychiatric Specialty Exam:   Presentation  General Appearance:  Appropriate for Environment; Casual   Eye Contact: Fair   Speech: Clear and Coherent   Speech Volume: Normal       Mood and Affect  Mood: "fine"   Affect: Less Constricted     Thought Process  Thought Processes: Coherent   Descriptions of Associations:Intact   Orientation:Full (Time, Place and Person)   Thought Content:Improving   Hallucinations:Denies   Ideas of Reference:None   Suicidal Thoughts: Passive SI  Homicidal Thoughts:Denies     Sensorium  Memory: Immediate Fair; Recent Fair; Remote Fair   Judgment: Improving   Insight: Improving     Executive Functions  Concentration: Fair   Attention Span: Fair   Recall: [employer] of Knowledge: Fair   Language: Fair     Psychomotor Activity  Psychomotor  Activity: Decreased   Musculoskeletal: Strength & Muscle Tone: within normal limits Gait & Station: normal Assets  Assets: Manufacturing systems engineer; Desire for Improvement; Physical Health   Physical Exam: Physical Exam Vitals and nursing note reviewed.  HENT:     Head: Normocephalic.     Mouth/Throat:     Mouth: Mucous membranes are moist.  Eyes:     Pupils: Pupils are equal, round, and reactive to light.  Cardiovascular:     Rate and Rhythm: Normal  rate.     Pulses: Normal pulses.  Pulmonary:     Breath sounds: Normal breath sounds.  Skin:    General: Skin is warm.  Neurological:     General: No focal deficit present.     Mental Status: She is alert and oriented to person, place, and time.    Review of Systems  Constitutional: Negative.   HENT: Negative.    Eyes: Negative.   Respiratory: Negative.    Cardiovascular: Negative.   Gastrointestinal: Negative.   Skin: Negative.   Neurological:  Negative for dizziness and speech change.   Blood pressure 108/67, pulse 63, temperature 98.2 F (36.8 C), resp. rate 14, height 5\' 3"  (1.6 m), weight 45.6 kg, SpO2 100%. Body mass index is 17.8 kg/m.  Diagnosis: Principal Problem:   Suicidal ideation Active Problems:   MDD (major depressive disorder), recurrent severe, without psychosis (HCC)   PLAN:Clinical Decision Making: Patient with history of depression and anxiety with recent inpatient psychiatric hospitalization few weeks ago, readmitted to the inpatient psychiatric unit after a suicide attempt with overdose on her medications with intent to die.  Patient reports multiple psychosocial stressors that includes living with her son who creates a hostile environment in the house and patient unable to handle her daily activities, taking care of 5 dogs with no help, worsening her depression and suicidal thoughts.  She needs inpatient hospitalization for further stabilization  Treatment Plan Summary:  Safety and Monitoring:  Will consider initiating IVC if patient wants to leave AMA             -- Voluntary admission to inpatient psychiatric unit for safety, stabilization and treatment             -- Daily contact with patient to assess and evaluate symptoms and progress in treatment             -- Patient's case to be discussed in multi-disciplinary team meeting             -- Observation Level: q15 minute checks             -- Vital signs:  q12 hours             -- Precautions: suicide, elopement, and assault   2. Psychiatric Diagnoses and Treatment:               Lexapro 20 mg gabapentin 100 in the morning and 200 at night Continue Abilify 5mg  nightly to boost up the antidepressant effect   Buspar 7.5 mg po BID Increased the dose of trazodone to 100 mg at bedtime to help with depression and insomnia, 11/09/23             -- Encouraged patient to participate in unit milieu and in scheduled group therapies                            3. Medical Issues Being Addressed:  Pain: Robaxin PRN   4. Discharge Planning:              -- Social work and case management to assist with discharge planning and identification of hospital follow-up needs prior to discharge             -- Estimated LOS: 2-3 days             -- Discharge Concerns: Need to establish a safety plan; Medication compliance and effectiveness             --  Discharge Goals: Return home with outpatient referrals follow ups  Lewanda Rife, MD

## 2023-11-11 MED ORDER — ADULT MULTIVITAMIN W/MINERALS CH
1.0000 | ORAL_TABLET | Freq: Every day | ORAL | Status: DC
  Administered 2023-11-11 – 2023-11-13 (×3): 1 via ORAL
  Filled 2023-11-11 (×3): qty 1

## 2023-11-11 MED ORDER — ENSURE ENLIVE PO LIQD
237.0000 mL | Freq: Three times a day (TID) | ORAL | Status: DC
  Administered 2023-11-11 – 2023-11-13 (×4): 237 mL via ORAL

## 2023-11-11 NOTE — Progress Notes (Signed)
[facility] MD Progress Note  11/11/2023  Julia Osborne  MRN:  [id number]  Julia Osborne is a 63 y.o. female admitted: Presented to the ED for suicidal attempt. She carries the psychiatric diagnoses of suicidal ideation, major depressive disorder, PTSD, GAD and has a past medical history of COPD, GERD, colon neoplasm, endometrial cancer, diverticulitis, Lynch syndrome.   Subjective:  Chart reviewed, case discussed in multidisciplinary meeting, patient seen during rounds.  Today during assessment patient endorsed "better" mood.  Patient sleeping every day.  She feels close to her baseline.  Patient denies thoughts of harming herself today which is an improvement from yesterday.   Patient denies side effects to medications. She denies auditory/visual hallucinations.  We discussed discharge.  Anticipated discharge tomorrow.  Patient was encouraged to call her son to go over the discharge plan.  Patient was encouraged to attend group and work on coping strategies.  Sleep: Fair  Appetite:  Fair  Past Psychiatric History: see h&P Family History:  Family History  Problem Relation Age of Onset   Depression Mother    Other Mother        clot that went to heart, deceased age 52ss   Heart attack Father        age 78s, deceased   Stroke Father    Cancer Father        "at death determined he was ate up with cancer"   Hypertension Sister    Kidney cancer Sister 83   Diabetes Sister    Diabetes Brother    Hypertension Brother    Brain cancer Paternal Aunt    Cancer Paternal Uncle        NOS   Cancer Paternal Uncle        NOS   Cancer Paternal Uncle        NOS   Heart attack Maternal Grandfather    Endometriosis Daughter    Alcohol abuse Son    Drug abuse Son    Colon cancer Neg Hx    Social History:  Social History   Substance and Sexual Activity  Alcohol Use No     Social History   Substance and Sexual Activity  Drug Use No    Social History   Socioeconomic History   Marital  status: Widowed    Spouse name: Not on file   Number of children: 2   Years of education: Not on file   Highest education level: Associate degree: occupational, Scientist, product/process development, or vocational program  Occupational History   Not on file  Tobacco Use   Smoking status: Never   Smokeless tobacco: Never  Vaping Use   Vaping status: Never Used  Substance and Sexual Activity   Alcohol use: No   Drug use: No   Sexual activity: Not Currently    Birth control/protection: Surgical    Comment: hyst  Other Topics Concern   Not on file  Social History Narrative   ** Merged History Encounter **       ** Data from: 04/28/23 Enc Dept: [facility]-PRE ADM TESTING   #  [place]ives [place]; smoking- 1/2 ppd; no alcohol; used to run machines;   FHx-Dad- MI; ? Cancer on autopsy; sisters/aunts- brain; lung cancer x2; oldest sister- kidney cancer  Pt has 35 year old son;daughter 33 years. Brothers- states to have s   poken to her family re: importance of them being checked for lynch.        ** Data from: 05/04/23 Enc Dept: [facility]  Lives alone with 4 large dogs.    Social Drivers of Health   Financial Resource Strain: Medium Risk (12/07/2022)   Overall Financial Resource Strain (CARDIA)    Difficulty of Paying Living Expenses: Somewhat hard  Food Insecurity: Food Insecurity Present (11/03/2023)   Hunger Vital Sign    Worried About Running Out of Food in the Last Year: Sometimes true    Ran Out of Food in the Last Year: Sometimes true  Transportation Needs: Unmet Transportation Needs (11/03/2023)   PRAPARE - Transportation    Lack of Transportation (Medical): Yes    Lack of Transportation (Non-Medical): Yes  Physical Activity: Inactive (12/07/2022)   Exercise Vital Sign    Days of Exercise per Week: 0 days    Minutes of Exercise per Session: 0 min  Stress: Stress Concern Present (12/07/2022)   [employer] of Occupational Health - Occupational Stress Questionnaire    Feeling of Stress : To  some extent  Social Connections: Socially Isolated (11/03/2023)   Social Connection and Isolation Panel [NHANES]    Frequency of Communication with Friends and Family: More than three times a week    Frequency of Social Gatherings with Friends and Family: Once a week    Attends Religious Services: Never    Database administrator or Organizations: No    Attends Banker Meetings: Never    Marital Status: Widowed   Past Medical History:  Past Medical History:  Diagnosis Date   Abdominal pain, acute, left upper quadrant    Abdominal pain, epigastric    Abnormal CT scan, sigmoid colon    Abnormal vaginal Pap smear    Adenocarcinoma of colon (HCC) 09/13/2015   Partial colon resection and chemo tx's.    Anemia    Anxiety    Anxiety disorder, unspecified    Aphasia    Asthma    Cancer (HCC)    endometrial; cancer cells in intestine   Chronic pain of left knee    Colon neoplasm    Constipation    Conversion reaction    COPD (chronic obstructive pulmonary disease) (HCC)    no definite diagnosis   Depression    Diverticulitis    Diverticulosis of colon without hemorrhage    Dyspareunia 05/16/2015   Dysphasia    Endometrial cancer (HCC)    Esophageal dysphagia    Family history of cancer    Family history of kidney cancer    Ganglion of left knee    Gastric intestinal metaplasia    GERD (gastroesophageal reflux disease)    Glaucoma    Hematochezia    History of colonic polyps    Hyperlipidemia    Indigestion    Lynch syndrome    Mucosal abnormality of stomach    Nausea and vomiting    Neuropathy    feet and hands   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 drug (HCC)    PONV (postoperative nausea and vomiting)    Pre-diabetes    Primary osteoarthritis of left knee    Rectal bleeding    Reflux esophagitis    Soft tissue swelling of knee joint    Uterine fibroid    UTI (urinary tract infection) 04/26/2023   on cipro   Vaginal dryness 05/16/2015   Vaginal  itching    Vaginal Pap smear, abnormal     Past Surgical History:  Procedure Laterality Date   ABDOMINAL HYSTERECTOMY     APPENDECTOMY     BIOPSY N/A 03/14/2015   Procedure: BIOPSY;  Surgeon: Corbin Ade, MD;  Location: [facility];  Service: Endoscopy;  Laterality: N/A;  Gastric   COLONOSCOPY N/A 01/28/2017   Procedure: COLONOSCOPY;  Surgeon: Corbin Ade, MD;  Location: [facility];  Service: Endoscopy;  Laterality: N/A;  11:30am   COLONOSCOPY WITH PROPOFOL N/A 03/14/2015   RMR: Internal hemorrhoids. colonic diverticulosis. Incomplete examination. Prepartation inadequate.   COLONOSCOPY WITH PROPOFOL N/A 07/04/2015   RMR: Colonic diverticulosis . Large polypoid lesion in the vicinity of the hepatic flexure status post saline-assisted piecmeal snare polypectomy  with ablation and tattooing as described. Sigmoid polyp removed as described above. sigmoid colon polyp hyperplastic, hepatic flexure polyp with TA with focal high grade dysplasia    COLONOSCOPY WITH PROPOFOL N/A 11/03/2018   Procedure: COLONOSCOPY WITH PROPOFOL;  Surgeon: Wyline Mood, MD;  Location: [facility] ENDOSCOPY;  Service: Gastroenterology;  Laterality: N/A;   COLONOSCOPY WITH PROPOFOL N/A 12/09/2020   Procedure: COLONOSCOPY WITH PROPOFOL;  Surgeon: Wyline Mood, MD;  Location: [facility] ENDOSCOPY;  Service: Gastroenterology;  Laterality: N/A;  COVID POSITIVE 10/30/2020   COLONOSCOPY WITH PROPOFOL N/A 01/20/2021   Procedure: COLONOSCOPY WITH PROPOFOL;  Surgeon: Wyline Mood, MD;  Location: [facility] ENDOSCOPY;  Service: Gastroenterology;  Laterality: N/A;   COLONOSCOPY WITH PROPOFOL N/A 12/10/2021   Procedure: COLONOSCOPY WITH PROPOFOL;  Surgeon: Wyline Mood, MD;  Location: [facility] ENDOSCOPY;  Service: Gastroenterology;  Laterality: N/A;   COLONOSCOPY WITH PROPOFOL N/A 02/16/2023   Procedure: COLONOSCOPY WITH PROPOFOL;  Surgeon: Wyline Mood, MD;  Location: [facility] ENDOSCOPY;  Service: Gastroenterology;  Laterality: N/A;   ENTEROSCOPY N/A  02/12/2020   Procedure: ENTEROSCOPY;  Surgeon: Wyline Mood, MD;  Location: [facility] ENDOSCOPY;  Service: Gastroenterology;  Laterality: N/A;   ESOPHAGEAL DILATION N/A 03/14/2015   Procedure: ESOPHAGEAL DILATION;  Surgeon: Corbin Ade, MD;  Location: [facility];  Service: Endoscopy;  Laterality: N/AElease Hashimoto 54   ESOPHAGOGASTRODUODENOSCOPY N/A 05/19/2016   Procedure: ESOPHAGOGASTRODUODENOSCOPY (EGD);  Surgeon: Corbin Ade, MD;  [facility];  Service: Endoscopy;  Laterality: N/A;  215   ESOPHAGOGASTRODUODENOSCOPY N/A 01/28/2017   Procedure: ESOPHAGOGASTRODUODENOSCOPY (EGD);  Surgeon: Corbin Ade, MD;  [facility];  Service: Endoscopy;  Laterality: N/A;   ESOPHAGOGASTRODUODENOSCOPY N/A 12/10/2021   Procedure: ESOPHAGOGASTRODUODENOSCOPY (EGD);  Surgeon: Wyline Mood, MD;  Location: [facility] ENDOSCOPY;  Service: Gastroenterology;  Laterality: N/A;   ESOPHAGOGASTRODUODENOSCOPY (EGD) WITH PROPOFOL N/A 03/14/2015   RMR: Mild erosive reflux esophagitis status post passage o f a Maloney dilator. Abnormal gastric mucosa of uncertain significance as described above. status post biopsy, benign   ESOPHAGOGASTRODUODENOSCOPY (EGD) WITH PROPOFOL N/A 11/18/2017   Procedure: ESOPHAGOGASTRODUODENOSCOPY (EGD) WITH PROPOFOL;  Surgeon: Corbin Ade, MD;  [facility];  Service: Endoscopy;  Laterality: N/A;  12:15pm   ESOPHAGOGASTRODUODENOSCOPY (EGD) WITH PROPOFOL N/A 11/03/2018   Procedure: ESOPHAGOGASTRODUODENOSCOPY (EGD) WITH PROPOFOL;  Surgeon: Wyline Mood, MD;  Location: [facility] ENDOSCOPY;  Service: Gastroenterology;  Laterality: N/A;   ESOPHAGOGASTRODUODENOSCOPY (EGD) WITH PROPOFOL N/A 02/16/2023   Procedure: ESOPHAGOGASTRODUODENOSCOPY (EGD) WITH PROPOFOL;  Surgeon: Wyline Mood, MD;  Location: [facility] ENDOSCOPY;  Service: Gastroenterology;  Laterality: N/A;   GIVENS CAPSULE STUDY N/A 12/25/2019   Procedure: GIVENS CAPSULE STUDY;  Surgeon: Wyline Mood, MD;  Location: [facility] ENDOSCOPY;   Service: Gastroenterology;  Laterality: N/A;   MALONEY DILATION N/A 05/19/2016   Procedure: [name] DILATION;  Surgeon: Corbin Ade, MD;  [facility];  Service: Endoscopy;  Laterality: N/A;   MALONEY DILATION N/A 01/28/2017   Procedure: MALONEY DILATION;  Surgeon:  Rourk, Gerrit Friends, MD;  [facility];  Service: Endoscopy;  Laterality: N/A;   MALONEY DILATION N/A 11/18/2017   Procedure: [name] DILATION;  Surgeon: Corbin Ade, MD;  Location[facility];  Service: Endoscopy;  Laterality: N/A;   PARTIAL COLECTOMY  08/30/2015   polyp with adenocarcinoma   PARTIAL COLECTOMY  08/29/2018   POLYPECTOMY N/A 07/04/2015   Procedure: POLYPECTOMY;  Surgeon: Corbin Ade, MD;  Location: [facility];  Service: Endoscopy;  Laterality: N/A;   PORTACATH PLACEMENT Right 09/21/2014   TUBAL LIGATION      Current Medications: Current Facility-Administered Medications  Medication Dose Route Frequency Provider Last Rate Last Admin   acetaminophen (TYLENOL) tablet 650 mg  650 mg Oral Q6H PRN Saucier, Jerlyn Ly, NP   650 mg at 11/09/23 0912   albuterol (PROVENTIL) (2.5 MG/3ML) 0.083% nebulizer solution 3 mL  3 mL Inhalation Q2H PRN Verner Chol, MD       alum & mag hydroxide-simeth (MAALOX/MYLANTA) 200-200-20 MG/5ML suspension 30 mL  30 mL Oral Q4H PRN Saucier, Jerlyn Ly, NP       ARIPiprazole (ABILIFY) tablet 5 mg  5 mg Oral QHS Verner Chol, MD   5 mg at 11/10/23 2128   aspirin EC tablet 81 mg  81 mg Oral Daily Verner Chol, MD   81 mg at 11/11/23 0830   atorvastatin (LIPITOR) tablet 40 mg  40 mg Oral Daily Verner Chol, MD   40 mg at 11/11/23 0828   brimonidine (ALPHAGAN) 0.2 % ophthalmic solution 1 drop  1 drop Left Eye QHS Verner Chol, MD   1 drop at 11/10/23 2130   brinzolamide (AZOPT) 1 % ophthalmic suspension 1 drop  1 drop Left Eye QHS Verner Chol, MD   1 drop at 11/10/23 2133   busPIRone (BUSPAR) tablet 7.5 mg  7.5 mg Oral BID Verner Chol, MD   7.5  mg at 11/11/23 2536   clopidogrel (PLAVIX) tablet 75 mg  75 mg Oral Daily Verner Chol, MD   75 mg at 11/11/23 0830   docusate sodium (COLACE) capsule 100 mg  100 mg Oral Daily PRN Verner Chol, MD       escitalopram (LEXAPRO) tablet 20 mg  20 mg Oral QHS Verner Chol, MD   20 mg at 11/10/23 2128   feeding supplement (ENSURE ENLIVE / ENSURE PLUS) liquid 237 mL  237 mL Oral TID BM Verner Chol, MD       ferrous sulfate tablet 325 mg  325 mg Oral Delrae Rend, Dutch Quint, MD   325 mg at 11/10/23 0940   fluticasone (FLONASE) 50 MCG/ACT nasal spray 1 spray  1 spray Each Nare Daily Verner Chol, MD   1 spray at 11/11/23 0827   gabapentin (NEURONTIN) capsule 100 mg  100 mg Oral Daily Verner Chol, MD   100 mg at 11/11/23 0827   gabapentin (NEURONTIN) capsule 200 mg  200 mg Oral QHS Verner Chol, MD   200 mg at 11/10/23 2128   hydrOXYzine (ATARAX) tablet 25 mg  25 mg Oral TID PRN Juliann Pares, NP   25 mg at 11/07/23 2134   latanoprost (XALATAN) 0.005 % ophthalmic solution 1 drop  1 drop Left Eye QHS Verner Chol, MD   1 drop at 11/10/23 2125   linaclotide (LINZESS) capsule 145 mcg  145 mcg Oral QAC breakfast Verner Chol, MD   145 mcg at 11/09/23 0912   magnesium hydroxide (MILK OF MAGNESIA) suspension 30 mL  30 mL Oral Daily  PRN Saucier, Jerlyn Ly, NP       meclizine (ANTIVERT) tablet 25 mg  25 mg Oral TID PRN Verner Chol, MD       methocarbamol (ROBAXIN) tablet 500 mg  500 mg Oral Q8H PRN Lewanda Rife, MD   500 mg at 11/11/23 0830   multivitamin with minerals tablet 1 tablet  1 tablet Oral Daily Verner Chol, MD   1 tablet at 11/11/23 1145   nicotine (NICODERM CQ - dosed in mg/24 hours) patch 14 mg  14 mg Transdermal Daily Verner Chol, MD   14 mg at 11/11/23 0831   OLANZapine (ZYPREXA) injection 5 mg  5 mg Intramuscular TID PRN Saucier, Jerlyn Ly, NP       OLANZapine zydis (ZYPREXA) disintegrating tablet 5 mg  5 mg Oral TID PRN Saucier, Jerlyn Ly, NP       ondansetron (ZOFRAN-ODT) disintegrating tablet 4 mg  4 mg Oral Q8H PRN Verner Chol, MD       pantoprazole (PROTONIX) EC tablet 40 mg  40 mg Oral QAC breakfast Verner Chol, MD   40 mg at 11/11/23 1610   senna-docusate (Senokot-S) tablet 2 tablet  2 tablet Oral Daily PRN Verner Chol, MD       traZODone (DESYREL) tablet 100 mg  100 mg Oral QHS PRN Lewanda Rife, MD        Lab Results:  No results found for this or any previous visit (from the past 48 hours).   Blood Alcohol level:  Lab Results  Component Value Date   ETH <10 11/03/2023   ETH <10 10/15/2023    Metabolic Disorder Labs: Lab Results  Component Value Date   HGBA1C 5.9 (H) 05/03/2023   MPG 123 05/03/2023   No results found for: "PROLACTIN" Lab Results  Component Value Date   CHOL 149 05/03/2023   TRIG 66 05/03/2023   HDL 49 05/03/2023   CHOLHDL 3.0 05/03/2023   VLDL 13 05/03/2023   LDLCALC 87 05/03/2023     Psychiatric Specialty Exam:   Presentation  General Appearance:  Appropriate for Environment; Casual   Eye Contact: Fair   Speech: Clear and Coherent   Speech Volume: Normal       Mood and Affect  Mood: "Better"   Affect: More animated      Thought Process  Thought Processes: Coherent   Descriptions of Associations:Intact   Orientation:Full (Time, Place and Person)   Thought Content:Improving   Hallucinations:Denies   Ideas of Reference:None   Suicidal Thoughts: Denies  Homicidal Thoughts:Denies     Sensorium  Memory: Immediate Fair; Recent Fair; Remote Fair   Judgment: Improved   Insight: Improved     Executive Functions  Concentration: Fair   Attention Span: Fair   Recall: [employer] of Knowledge: Fair   Language: Fair     Psychomotor Activity  Psychomotor Activity: Normal   Musculoskeletal: Strength & Muscle Tone: within normal limits Gait & Station: normal Assets  Assets: Manufacturing systems engineer; Desire for  Improvement; Physical Health   Physical Exam: Physical Exam Vitals and nursing note reviewed.  HENT:     Head: Normocephalic.     Mouth/Throat:     Mouth: Mucous membranes are moist.  Eyes:     Pupils: Pupils are equal, round, and reactive to light.  Cardiovascular:     Rate and Rhythm: Normal rate.     Pulses: Normal pulses.  Pulmonary:     Breath sounds: Normal breath sounds.  Skin:  General: Skin is warm.  Neurological:     General: No focal deficit present.     Mental Status: She is alert and oriented to person, place, and time.    Review of Systems  Constitutional: Negative.   HENT: Negative.    Eyes: Negative.   Respiratory: Negative.    Cardiovascular: Negative.   Gastrointestinal: Negative.   Skin: Negative.   Neurological:  Negative for dizziness and speech change.   Blood pressure 117/63, pulse 60, temperature 97.9 F (36.6 C), resp. rate 18, height 5\' 3"  (1.6 m), weight 45.6 kg, SpO2 100%. Body mass index is 17.8 kg/m.  Diagnosis: Principal Problem:   Suicidal ideation Active Problems:   MDD (major depressive disorder), recurrent severe, without psychosis (HCC)   PLAN:Clinical Decision Making: Patient with history of depression and anxiety with recent inpatient psychiatric hospitalization few weeks ago, readmitted to the inpatient psychiatric unit after a suicide attempt with overdose on her medications with intent to die.  Patient reports multiple psychosocial stressors that includes living with her son who creates a hostile environment in the house and patient unable to handle her daily activities, taking care of 5 dogs with no help, worsening her depression and suicidal thoughts.  She needs inpatient hospitalization for further stabilization  Treatment Plan Summary:  Safety and Monitoring: Will consider initiating IVC if patient wants to leave AMA             -- Voluntary admission to inpatient psychiatric unit for safety, stabilization and treatment              -- Daily contact with patient to assess and evaluate symptoms and progress in treatment             -- Patient's case to be discussed in multi-disciplinary team meeting             -- Observation Level: q15 minute checks             -- Vital signs:  q12 hours             -- Precautions: suicide, elopement, and assault   2. Psychiatric Diagnoses and Treatment:               Lexapro 20 mg gabapentin 100 in the morning and 200 at night Continue Abilify 5mg  nightly to boost up the antidepressant effect   Buspar 7.5 mg po BID Increased the dose of trazodone to 100 mg at bedtime to help with depression and insomnia, 11/09/23             -- Encouraged patient to participate in unit milieu and in scheduled group therapies                            3. Medical Issues Being Addressed:  Pain: Robaxin PRN   4. Discharge Planning:              -- Social work and case management to assist with discharge planning and identification of hospital follow-up needs prior to discharge             -Anticipated discharge tomorrow  Lewanda Rife, MD

## 2023-11-11 NOTE — Group Note (Signed)
Date:  11/11/2023 Time:  9:57 PM  Group Topic/Focus:  Overcoming Stress:   The focus of this group is to define stress and help patients assess their triggers.    Participation Level:  Active  Participation Quality:  Appropriate  Affect:  Appropriate  Cognitive:  Appropriate  Insight: Appropriate  Engagement in Group:  Engaged  Modes of Intervention:  Discussion  Additional Comments:    Garry Heater 11/11/2023, 9:57 PM

## 2023-11-11 NOTE — Group Note (Unsigned)
Date:  11/11/2023 Time:  4:54 AM  Group Topic/Focus:  Building Self Esteem:   The Focus of this group is helping patients become aware of the effects of self-esteem on their lives, the things they and others do that enhance or undermine their self-esteem, seeing the relationship between their level of self-esteem and the choices they make and learning ways to enhance self-esteem.     Participation Level:  {BHH PARTICIPATION WJXBJ:47829}  Participation Quality:  {BHH PARTICIPATION QUALITY:22265}  Affect:  {BHH AFFECT:22266}  Cognitive:  {BHH COGNITIVE:22267}  Insight: {BHH Insight2:20797}  Engagement in Group:  {BHH ENGAGEMENT IN FAOZH:08657}  Modes of Intervention:  {BHH MODES OF INTERVENTION:22269}  Additional Comments:  ***  Mylisa Brunson 11/11/2023, 4:54 AM

## 2023-11-11 NOTE — Progress Notes (Signed)
NUTRITION ASSESSMENT  Pt identified as at risk on the Malnutrition Screen Tool  INTERVENTION:  -Continue regular diet -MVI with minerals daily -Ensure Enlive po TID, each supplement provides 350 kcal and 20 grams of protein.   NUTRITION DIAGNOSIS: Unintentional weight loss related to sub-optimal intake as evidenced by pt report.   Goal: Pt to meet >/= 90% of their estimated nutrition needs.  Monitor:  PO intake  Assessment:   Pt admitted for suicidal attempt. She carries the psychiatric diagnoses of suicidal ideation, major depressive disorder, PTSD, GAD and has a past medical history of COPD, GERD, colon neoplasm, endometrial cancer, diverticulitis, Lynch syndrome.   Pt admitted with SI.   Pt currently on a regular diet. Per staff report, pt consuming about 50% of meals. Pt shares that she is trying to eat, as she has a fair appetite which worsens when she is depressed.   Reviewed wt hx; pt has experienced a 10.7% wt loss over the past 2 months, which is significant for time frame. Pt also underweight. Highly suspect pt with malnutrition, however, unable to identify at this time. Pt would greatly benefit from addition of oral nutrition.  Medications reviewed and include lexapro, ferrous sulfate, neurontin, and protonix.   Noted pt with stage IV endometrial cancer, followed by [facility]. Last oncology notes in [facility] dated in 2016.   Lab Results  Component Value Date   HGBA1C 5.9 (H) 05/03/2023   PTA DM medications are none.   Labs reviewed: CBGS: 108 (inpatient orders for glycemic control are ).    63 y.o. female  Height: Ht Readings from Last 1 Encounters:  11/03/23 5\' 3"  (1.6 m)    Weight: Wt Readings from Last 1 Encounters:  11/03/23 45.6 kg    Weight Hx: Wt Readings from Last 10 Encounters:  11/03/23 45.6 kg  11/03/23 49 kg  11/01/23 47.2 kg  10/28/23 49.4 kg  10/16/23 46 kg  10/15/23 47.2 kg  10/06/23 47.2 kg  10/02/23 50 kg  09/29/23 50  kg  08/31/23 51.4 kg    BMI:  Body mass index is 17.8 kg/m. Pt meets criteria for underweight based on current BMI.  Estimated Nutritional Needs: Kcal: 25-30 kcal/kg Protein: > 1 gram protein/kg Fluid: 1 ml/kcal  Diet Order:  Diet Order             Diet regular Room service appropriate? Yes; Fluid consistency: Thin  Diet effective now                  Pt is also offered choice of unit snacks mid-morning and mid-afternoon.  Pt is eating as desired.   Lab results and medications reviewed.   Levada Schilling, RD, LDN, CDCES Registered Dietitian III Certified Diabetes Care and Education Specialist If unable to reach this RD, please use "RD Inpatient" group chat on secure chat between hours of 8am-4 pm daily

## 2023-11-11 NOTE — Plan of Care (Signed)
  Problem: Education: Goal: Ability to make informed decisions regarding treatment will improve 11/11/2023 0411 by Foster Simpson, RN Outcome: Progressing 11/11/2023 0410 by Foster Simpson, RN Outcome: Progressing   Problem: Coping: Goal: Coping ability will improve 11/11/2023 0411 by Foster Simpson, RN Outcome: Progressing 11/11/2023 0410 by Foster Simpson, RN Outcome: Progressing   Problem: Health Behavior/Discharge Planning: Goal: Identification of resources available to assist in meeting health care needs will improve Outcome: Progressing   Problem: Medication: Goal: Compliance with prescribed medication regimen will improve Outcome: Progressing

## 2023-11-11 NOTE — Progress Notes (Signed)
Patient is a voluntary admission to [facility] for suicide attempt with c/o stressors at home causing her stress.  Expected discharge is tomorrow.  Patient denied SI, HI, AVH, anxiety and depression.  Received robaxin today prn twice.  Calm, cooperative with staff and peers.  States she will need a ride home tomorrow for discharge.  Will continue to monitor.

## 2023-11-11 NOTE — Plan of Care (Signed)
  Problem: Education: Goal: Ability to make informed decisions regarding treatment will improve Outcome: Progressing   Problem: Coping: Goal: Coping ability will improve Outcome: Progressing   Problem: Health Behavior/Discharge Planning: Goal: Identification of resources available to assist in meeting health care needs will improve Outcome: Progressing   Problem: Medication: Goal: Compliance with prescribed medication regimen will improve Outcome: Progressing   Problem: Self-Concept: Goal: Ability to disclose and discuss suicidal ideas will improve Outcome: Progressing   

## 2023-11-12 NOTE — Group Note (Signed)
Date:  11/12/2023 Time:  8:31 PM  Group Topic/Focus:  Overcoming Stress:   The focus of this group is to define stress and help patients assess their triggers.    Participation Level:  Active  Participation Quality:  Appropriate  Affect:  Appropriate  Cognitive:  Appropriate  Insight: Appropriate  Engagement in Group:  Engaged  Modes of Intervention:  Discussion  Additional Comments:    Garry Heater 11/12/2023, 8:31 PM
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
## 2023-11-12 NOTE — Plan of Care (Signed)
  Problem: Education: Goal: Knowledge of [facility] Education information/materials will improve Outcome: Progressing Goal: Emotional status will improve Outcome: Progressing Goal: Mental status will improve Outcome: Progressing   Problem: Activity: Goal: Interest or engagement in activities will improve Outcome: Progressing   Problem: Coping: Goal: Ability to verbalize frustrations and anger appropriately will improve Outcome: Progressing
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
## 2023-11-12 NOTE — Progress Notes (Signed)
Occupational Therapy Group Note  Group Topic:  Estate manager/land agent, Adaptive Equipment for ADLs Group Date: 11/12/23 Group Time (start and end): [id number] Facilitators:  Wynona Canes, OT  Group Description: Group educated on safety considerations/modifications with bathing, dressing and ADL routines to maximize independence and minimize fall risk with tasks.  Reviewed and demonstrated use of shower chair and tub transfer bench as appropriate.  Reviewed and demonstrated use of adaptive equipment (sock aide, reacher, long-handled sponge) available for ADL tasks.  Reviewed and demonstrated role of activity pacing and energy conservation with functional activities.  Provided handout with visual reference of available equipment.    Therapeutic Goal(s): Verbalize and demonstrate safe technique for tub/shower transfers with DME/adaptive equipment as needed. Verbalize and demonstrate appropriate use of adaptive equipment with bathing, dressing and ADL routine. Verbalize and demonstrate appropriate use of activity pacing and energy conservation with bathing, dressing and ADL routine.  Individual Participation: Pt was engaged and interactive throughout session. Insightful and asked appropriate questions which added to group discussion.    Participation Level and Quality: Engaged, interactive   Behavior: Appropriate    Speech/Thought Process: Appropriate    Affect/Mood: Appropriate    Insight: Good   Judgement: Good   Modes of Intervention: Verbal education, visual demonstration, hands out opportunities, handout     Plan: Continue to engage patient in PT/OT groups 1-2x/week.   Arman Filter., MPH, MS, OTR/L ascom [phone] 11/12/23, 3:59 PM

## 2023-11-12 NOTE — Progress Notes (Signed)
   11/12/23 0615  15 Minute Checks  Location Bedroom  Visual Appearance Calm  Behavior Sleeping  Sleep (Behavioral Health Patients Only)  Calculate sleep? (Click Yes once per 24 hr at 0600 safety check) Yes  Documented sleep last 24 hours 7.75

## 2023-11-12 NOTE — Plan of Care (Signed)
D: Pt alert and oriented. Pt reports experiencing anxiety/depression at this time. Pt reports experiencing 8/10 chronic Left foot pain at this time, prn medication given. Pt denies experiencing any SI/HI, or AVH at this time.   A: Scheduled medications administered to pt, per MD orders. Support and encouragement provided. Frequent verbal contact made. Routine safety checks conducted q15 minutes.   R: No adverse drug reactions noted. Pt verbally contracts for safety at this time. Pt compliant with medications and treatment plan. Pt interacts well with others on the unit. Pt remains safe at this time. Plan of care ongoing.  Problem: Education: Goal: Emotional status will improve Outcome: Not Progressing   Problem: Activity: Goal: Interest or engagement in activities will improve Outcome: Progressing

## 2023-11-13 MED ORDER — DOCUSATE SODIUM 100 MG PO CAPS: 100.0000 mg | ORAL_CAPSULE | Freq: Every day | ORAL | 0 refills | Status: AC | PRN

## 2023-11-13 MED ORDER — CLOPIDOGREL BISULFATE 75 MG PO TABS: 75.0000 mg | ORAL_TABLET | Freq: Every day | ORAL | 0 refills | Status: DC

## 2023-11-13 MED ORDER — NICOTINE 14 MG/24HR TD PT24: 14.0000 mg | MEDICATED_PATCH | Freq: Every day | TRANSDERMAL | 0 refills | Status: AC

## 2023-11-13 MED ORDER — HYDROXYZINE HCL 25 MG PO TABS: 25.0000 mg | ORAL_TABLET | Freq: Three times a day (TID) | ORAL | 0 refills | Status: AC | PRN

## 2023-11-13 MED ORDER — FERROUS SULFATE 325 (65 FE) MG PO TABS: 325.0000 mg | ORAL_TABLET | ORAL | 0 refills | Status: DC

## 2023-11-13 MED ORDER — ADULT MULTIVITAMIN W/MINERALS CH: 1.0000 | ORAL_TABLET | Freq: Every day | ORAL | 0 refills | Status: AC

## 2023-11-13 MED ORDER — LINACLOTIDE 145 MCG PO CAPS: 145.0000 ug | ORAL_CAPSULE | Freq: Every day | ORAL | 0 refills | Status: AC

## 2023-11-13 MED ORDER — BRIMONIDINE TARTRATE 0.2 % OP SOLN: 1.0000 [drp] | Freq: Every day | OPHTHALMIC | 12 refills | Status: DC

## 2023-11-13 MED ORDER — METHOCARBAMOL 500 MG PO TABS: 500.0000 mg | ORAL_TABLET | Freq: Three times a day (TID) | ORAL | 0 refills | Status: AC | PRN

## 2023-11-13 MED ORDER — LATANOPROST 0.005 % OP SOLN: 1.0000 [drp] | Freq: Every day | OPHTHALMIC | 12 refills | Status: DC

## 2023-11-13 MED ORDER — PANTOPRAZOLE SODIUM 40 MG PO TBEC: 40.0000 mg | DELAYED_RELEASE_TABLET | Freq: Every day | ORAL | 0 refills | Status: AC

## 2023-11-13 MED ORDER — ARIPIPRAZOLE 5 MG PO TABS: 5.0000 mg | ORAL_TABLET | Freq: Every day | ORAL | 0 refills | Status: AC

## 2023-11-13 MED ORDER — GABAPENTIN 100 MG PO CAPS: 100.0000 mg | ORAL_CAPSULE | Freq: Every day | ORAL | 0 refills | Status: AC

## 2023-11-13 MED ORDER — GABAPENTIN 100 MG PO CAPS: 200.0000 mg | ORAL_CAPSULE | Freq: Every day | ORAL | 0 refills | Status: DC

## 2023-11-13 MED ORDER — MECLIZINE HCL 25 MG PO TABS: 25.0000 mg | ORAL_TABLET | Freq: Three times a day (TID) | ORAL | 0 refills | Status: AC | PRN

## 2023-11-13 MED ORDER — TRAZODONE HCL 100 MG PO TABS: 100.0000 mg | ORAL_TABLET | Freq: Every evening | ORAL | 0 refills | Status: DC | PRN

## 2023-11-13 MED ORDER — ESCITALOPRAM OXALATE 20 MG PO TABS: 20.0000 mg | ORAL_TABLET | Freq: Every day | ORAL | 0 refills | Status: AC

## 2023-11-13 MED ORDER — ASPIRIN 81 MG PO TBEC: 81.0000 mg | DELAYED_RELEASE_TABLET | Freq: Every day | ORAL | 12 refills | Status: AC

## 2023-11-13 MED ORDER — BUSPIRONE HCL 7.5 MG PO TABS: 7.5000 mg | ORAL_TABLET | Freq: Two times a day (BID) | ORAL | 0 refills | Status: AC

## 2023-11-13 MED ORDER — ATORVASTATIN CALCIUM 40 MG PO TABS: 40.0000 mg | ORAL_TABLET | Freq: Every day | ORAL | 0 refills | Status: DC

## 2023-11-13 NOTE — Plan of Care (Signed)
  Problem: Education: Goal: Knowledge of [facility] Education information/materials will improve Outcome: Adequate for Discharge Goal: Emotional status will improve Outcome: Adequate for Discharge Goal: Mental status will improve Outcome: Adequate for Discharge Goal: Verbalization of understanding the information provided will improve Outcome: Adequate for Discharge   Problem: Activity: Goal: Interest or engagement in activities will improve Outcome: Adequate for Discharge Goal: Sleeping patterns will improve Outcome: Adequate for Discharge   Problem: Coping: Goal: Ability to verbalize frustrations and anger appropriately will improve Outcome: Adequate for Discharge Goal: Ability to demonstrate self-control will improve Outcome: Adequate for Discharge   Problem: Health Behavior/Discharge Planning: Goal: Identification of resources available to assist in meeting health care needs will improve Outcome: Adequate for Discharge Goal: Compliance with treatment plan for underlying cause of condition will improve Outcome: Adequate for Discharge   Problem: Physical Regulation: Goal: Ability to maintain clinical measurements within normal limits will improve Outcome: Adequate for Discharge   Problem: Safety: Goal: Periods of time without injury will increase Outcome: Adequate for Discharge   Problem: Education: Goal: Ability to make informed decisions regarding treatment will improve Outcome: Adequate for Discharge   Problem: Coping: Goal: Coping ability will improve Outcome: Adequate for Discharge   Problem: Health Behavior/Discharge Planning: Goal: Identification of resources available to assist in meeting health care needs will improve Outcome: Adequate for Discharge   Problem: Medication: Goal: Compliance with prescribed medication regimen will improve Outcome: Adequate for Discharge   Problem: Self-Concept: Goal: Ability to disclose and discuss suicidal ideas  will improve Outcome: Adequate for Discharge Goal: Will verbalize positive feelings about self Outcome: Adequate for Discharge Note: Patient is on track. Patient will adhere to provider and/or lab appointments   Care plan complete patient discharged

## 2023-11-13 NOTE — Progress Notes (Signed)
  [facility] Adult Case Management Discharge Plan :  Will you be returning to the same living situation after discharge:  Yes,  Pt returning home at discharge. At discharge, do you have transportation home?: Yes,  CSW will provide taxi voucher. Do you have the ability to pay for your medications: Yes,  Pt reports she can pay for her medications.  Release of information consent forms completed and in the chart;  Patient's signature needed at discharge.  Patient to Follow up at:  Follow-up Information     [facility] Follow up.   Why: To initiate services, you must do a walk in appointment. Walk-in hours are from 8:00 AM - 5:00 PM Contact information: [address] [place] [address] [phone]         [facility] Follow up.   Why: Your appointment is scheduled for 2/26 at 3 via the phone and will receive a call at [phone] Contact information: [address]  [address] [place] [address] [phone]                 Next level of care provider has access to [facility] Link:yes  Safety Planning and Suicide Prevention discussed: Yes,  CSW completed with [place], son [phone])     Has patient been referred to the Quitline?: Patient refused referral for treatment  Patient has been referred for addiction treatment: No known substance use disorder.  Felecia Shelling Yerick Eggebrecht, LCSWA 11/13/2023, 11:30 AM

## 2023-11-13 NOTE — [facility] Suicide Risk Assessment (Signed)
 [facility] Discharge Suicide Risk Assessment   Principal Problem: Suicidal ideation Discharge Diagnoses: Principal Problem:   Suicidal ideation Active Problems:   MDD (major depressive disorder), recurrent severe, without psychosis [facility])    Psychiatric Specialty Exam:   Presentation  General Appearance:  Appropriate for Environment; Casual   Eye Contact: Fair   Speech: Clear and Coherent   Speech Volume: Normal       Mood and Affect  Mood: "Better"   Affect: More animated   Thought Process  Thought Processes: Coherent   Descriptions of Associations:Intact   Orientation:Full (Time, Place and Person)   Thought Content:Improving   Hallucinations:Denies   Ideas of Reference:None   Suicidal Thoughts: Denies   Homicidal Thoughts:Denies     Sensorium  Memory: Immediate Fair; Recent Fair; Remote Fair   Judgment: Improved   Insight: Improved     Executive Functions  Concentration: Fair   Attention Span: Fair   Recall: [employer] of Knowledge: Fair   Language: Fair     Psychomotor Activity  Psychomotor Activity: Normal   Musculoskeletal: Strength & Muscle Tone: within normal limits Gait & Station: normal Assets  Assets: Manufacturing systems engineer; Desire for Improvement; Physical Health     Physical Exam: Physical Exam Vitals and nursing note reviewed.  HENT:     Head: Normocephalic.     Mouth/Throat:     Mouth: Mucous membranes are moist.  Eyes:     Pupils: Pupils are equal, round, and reactive to light.  Cardiovascular:     Rate and Rhythm: Normal rate.     Pulses: Normal pulses.  Pulmonary:     Breath sounds: Normal breath sounds.  Skin:    General: Skin is warm.  Neurological:     General: No focal deficit present.     Mental Status: She is alert and oriented to person, place, and time.      Review of Systems  Constitutional: Negative.   HENT: Negative.    Eyes: Negative.   Respiratory: Negative.    Cardiovascular:  Negative.   Gastrointestinal: Negative.   Skin: Negative.   Neurological:  Negative for dizziness and speech change.   Blood pressure (!) 145/77, pulse 72, temperature 98.2 F (36.8 C), resp. rate 16, height 5\' 3"  (1.6 m), weight 45.6 kg, SpO2 100%. Body mass index is 17.8 kg/m.   Demographic Factors:  Low socioeconomic status  Loss Factors: Financial problems/change in socioeconomic status  Historical Factors: Prior suicide attempts and Impulsivity  Risk Reduction Factors:   Religious beliefs about death, Positive therapeutic relationship, and Positive coping skills or problem solving skills  Continued Clinical Symptoms:  Previous Psychiatric Diagnoses and Treatments  Cognitive Features That Contribute To Risk:  Thought constriction (tunnel vision)    Suicide Risk:  Minimal: No identifiable suicidal ideation.     Follow-up Information     [facility] Follow up.   Why: To initiate services, you must do a walk in appointment. Walk-in hours are from 8:00 AM - 5:00 PM Contact information: [address] [place] [address] [phone]         [facility] Follow up.   Why: Your appointment is scheduled for 2/26 at 3 via the phone and will receive a call at [phone] Contact information: [address]  [address] [place] [address] [phone]                 Plan Of Care/Follow-up recommendations:  Per discharge summary  Lewanda Rife, MD 11/13/2023,  10:11 AM

## 2023-11-13 NOTE — Progress Notes (Signed)
 Patient ID: Julia Osborne, female   DOB: [birth year], 63 y.o.   MRN: [id number] Patient discharged to home self.care. Patient transported via taxi. Upon discharge patient denies SI, HI, and AVH and stated she was ready to return home to her pets. Patient acknowledged understanding of her discharge instructions and receipt of all her personal belongings.

## 2023-11-13 NOTE — Progress Notes (Addendum)
 Pt out int the milieu throughout the evening, she seemed preoccupied and focused on upcoming discharge.  11/12/23 2030  Psych Admission Type (Psych Patients Only)  Admission Status Voluntary  Psychosocial Assessment  Patient Complaints Anxiety;Depression  Eye Contact Fair  Facial Expression Flat  Affect Sad  Speech Logical/coherent  Interaction Assertive  Motor Activity Slow  Appearance/Hygiene Unremarkable  Behavior Characteristics Cooperative  Mood Sad;Pleasant  Thought Process  Coherency WDL  Content WDL  Delusions None reported or observed  Perception WDL  Hallucination None reported or observed  Judgment Impaired  Confusion None  Danger to Self  Current suicidal ideation? Denies  Agreement Not to Harm Self Yes  Description of Agreement verbal  Danger to Others  Danger to Others None reported or observed

## 2023-11-13 NOTE — Discharge Summary (Signed)
 Physician Discharge Summary Note  Patient:  Julia Osborne is an 63 y.o., female MRN:  [id number] DOB:  1960/12/15 Patient phone:  [phone] (home)  Patient address:   [address] [place] [address],    Date of Admission:  11/03/2023 Date of Discharge: 11/13/2023  Reason for Admission:  Julia Osborne is a 64 y.o. female admitted: Presented to the EDfor 11/03/2023  1:44 PM for suicidal attempt. She carries the psychiatric diagnoses of suicidal ideation, major depressive disorder, PTSD, GAD and has a past medical history of COPD, GERD, colon neoplasm, endometrial cancer, diverticulitis, Lynch syndrome.   Principal Problem: Suicidal ideation Discharge Diagnoses: Principal Problem:   Suicidal ideation Active Problems:   MDD (major depressive disorder), recurrent severe, without psychosis (HCC)   Past Psychiatric History: Prev Dx/Sx: Major depressive disorder, PTSD, GAD, insomnia Current Psych Provider: [facility] Home Meds (current): Lexapro, BuSpar, gabapentin, hydroxyzine, trazodone Previous Med Trials: Denies Therapy: Requested but unable to get an appointment   Prior Psych Hospitalization: Recent hospitalization from October 16, 2023 to October 22, 2023 at [facility] at [facility] Prior Self Harm: Endorses Prior Violence: Denies  Past Medical History:  Past Medical History:  Diagnosis Date   Abdominal pain, acute, left upper quadrant    Abdominal pain, epigastric    Abnormal CT scan, sigmoid colon    Abnormal vaginal Pap smear    Adenocarcinoma of colon (HCC) 09/13/2015   Partial colon resection and chemo tx's.    Anemia    Anxiety    Anxiety disorder, unspecified    Aphasia    Asthma    Cancer (HCC)    endometrial; cancer cells in intestine   Chronic pain of left knee    Colon neoplasm    Constipation    Conversion reaction    COPD (chronic obstructive pulmonary disease) (HCC)    no definite diagnosis   Depression     Diverticulitis    Diverticulosis of colon without hemorrhage    Dyspareunia 05/16/2015   Dysphasia    Endometrial cancer (HCC)    Esophageal dysphagia    Family history of cancer    Family history of kidney cancer    Ganglion of left knee    Gastric intestinal metaplasia    GERD (gastroesophageal reflux disease)    Glaucoma    Hematochezia    History of colonic polyps    Hyperlipidemia    Indigestion    Lynch syndrome    Mucosal abnormality of stomach    Nausea and vomiting    Neuropathy    feet and hands   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 drug (HCC)    PONV (postoperative nausea and vomiting)    Pre-diabetes    Primary osteoarthritis of left knee    Rectal bleeding    Reflux esophagitis    Soft tissue swelling of knee joint    Uterine fibroid    UTI (urinary tract infection) 04/26/2023   on cipro   Vaginal dryness 05/16/2015   Vaginal itching    Vaginal Pap smear, abnormal     Past Surgical History:  Procedure Laterality Date   ABDOMINAL HYSTERECTOMY     APPENDECTOMY     BIOPSY N/A 03/14/2015   Procedure: BIOPSY;  Surgeon: Corbin Ade, MD;  Location: [facility];  Service: Endoscopy;  Laterality: N/A;  Gastric   COLONOSCOPY N/A 01/28/2017   Procedure: COLONOSCOPY;  Surgeon: Corbin Ade, MD;  Location: [facility];  Service: Endoscopy;  Laterality: N/A;  11:30am   COLONOSCOPY WITH PROPOFOL N/A 03/14/2015   RMR: Internal hemorrhoids. colonic diverticulosis. Incomplete examination. Prepartation inadequate.   COLONOSCOPY WITH PROPOFOL N/A 07/04/2015   RMR: Colonic diverticulosis . Large polypoid lesion in the vicinity of the hepatic flexure status post saline-assisted piecmeal snare polypectomy  with ablation and tattooing as described. Sigmoid polyp removed as described above. sigmoid colon polyp hyperplastic, hepatic flexure polyp with TA with focal high grade dysplasia    COLONOSCOPY WITH PROPOFOL N/A 11/03/2018   Procedure: COLONOSCOPY WITH PROPOFOL;  Surgeon:  Wyline Mood, MD;  Location: [facility] ENDOSCOPY;  Service: Gastroenterology;  Laterality: N/A;   COLONOSCOPY WITH PROPOFOL N/A 12/09/2020   Procedure: COLONOSCOPY WITH PROPOFOL;  Surgeon: Wyline Mood, MD;  Location: [facility] ENDOSCOPY;  Service: Gastroenterology;  Laterality: N/A;  COVID POSITIVE 10/30/2020   COLONOSCOPY WITH PROPOFOL N/A 01/20/2021   Procedure: COLONOSCOPY WITH PROPOFOL;  Surgeon: Wyline Mood, MD;  Location: [facility] ENDOSCOPY;  Service: Gastroenterology;  Laterality: N/A;   COLONOSCOPY WITH PROPOFOL N/A 12/10/2021   Procedure: COLONOSCOPY WITH PROPOFOL;  Surgeon: Wyline Mood, MD;  Location: [facility] ENDOSCOPY;  Service: Gastroenterology;  Laterality: N/A;   COLONOSCOPY WITH PROPOFOL N/A 02/16/2023   Procedure: COLONOSCOPY WITH PROPOFOL;  Surgeon: Wyline Mood, MD;  Location: [facility] ENDOSCOPY;  Service: Gastroenterology;  Laterality: N/A;   ENTEROSCOPY N/A 02/12/2020   Procedure: ENTEROSCOPY;  Surgeon: Wyline Mood, MD;  Location: [facility] ENDOSCOPY;  Service: Gastroenterology;  Laterality: N/A;   ESOPHAGEAL DILATION N/A 03/14/2015   Procedure: ESOPHAGEAL DILATION;  Surgeon: Corbin Ade, MD;  Location: [facility];  Service: Endoscopy;  Laterality: N/AElease Hashimoto 54   ESOPHAGOGASTRODUODENOSCOPY N/A 05/19/2016   Procedure: ESOPHAGOGASTRODUODENOSCOPY (EGD);  Surgeon: Corbin Ade, MD;  [facility];  Service: Endoscopy;  Laterality: N/A;  215   ESOPHAGOGASTRODUODENOSCOPY N/A 01/28/2017   Procedure: ESOPHAGOGASTRODUODENOSCOPY (EGD);  Surgeon: Corbin Ade, MD;  [facility];  Service: Endoscopy;  Laterality: N/A;   ESOPHAGOGASTRODUODENOSCOPY N/A 12/10/2021   Procedure: ESOPHAGOGASTRODUODENOSCOPY (EGD);  Surgeon: Wyline Mood, MD;  Location: [facility] ENDOSCOPY;  Service: Gastroenterology;  Laterality: N/A;   ESOPHAGOGASTRODUODENOSCOPY (EGD) WITH PROPOFOL N/A 03/14/2015   RMR: Mild erosive reflux esophagitis status post passage o f a Maloney dilator. Abnormal gastric mucosa of uncertain  significance as described above. status post biopsy, benign   ESOPHAGOGASTRODUODENOSCOPY (EGD) WITH PROPOFOL N/A 11/18/2017   Procedure: ESOPHAGOGASTRODUODENOSCOPY (EGD) WITH PROPOFOL;  Surgeon: Corbin Ade, MD;  [facility];  Service: Endoscopy;  Laterality: N/A;  12:15pm   ESOPHAGOGASTRODUODENOSCOPY (EGD) WITH PROPOFOL N/A 11/03/2018   Procedure: ESOPHAGOGASTRODUODENOSCOPY (EGD) WITH PROPOFOL;  Surgeon: Wyline Mood, MD;  Location[facility] ENDOSCOPY;  Service: Gastroenterology;  Laterality: N/A;   ESOPHAGOGASTRODUODENOSCOPY (EGD) WITH PROPOFOL N/A 02/16/2023   Procedure: ESOPHAGOGASTRODUODENOSCOPY (EGD) WITH PROPOFOL;  Surgeon: Wyline Mood, MD;  Location: [facility] ENDOSCOPY;  Service: Gastroenterology;  Laterality: N/A;   GIVENS CAPSULE STUDY N/A 12/25/2019   Procedure: GIVENS CAPSULE STUDY;  Surgeon: Wyline Mood, MD;  Location: [facility] ENDOSCOPY;  Service: Gastroenterology;  Laterality: N/A;   MALONEY DILATION N/A 05/19/2016   Procedure: [name] DILATION;  Surgeon: Corbin Ade, MD;  [facility];  Service: Endoscopy;  Laterality: N/A;   MALONEY DILATION N/A 01/28/2017   Procedure: [name] DILATION;  Surgeon: Corbin Ade, MD;  [facility];  Service: Endoscopy;  Laterality: N/A;   MALONEY DILATION N/A 11/18/2017   Procedure: [name] DILATION;  Surgeon: Corbin Ade, MD;  Location[facility];  Service: Endoscopy;  Laterality: N/A;  PARTIAL COLECTOMY  08/30/2015   polyp with adenocarcinoma   PARTIAL COLECTOMY  08/29/2018   POLYPECTOMY N/A 07/04/2015   Procedure: POLYPECTOMY;  Surgeon: Corbin Ade, MD;  Location: [facility];  Service: Endoscopy;  Laterality: N/A;   PORTACATH PLACEMENT Right 09/21/2014   TUBAL LIGATION     Family History:  Family History  Problem Relation Age of Onset   Depression Mother    Other Mother        clot that went to heart, deceased age 18ss   Heart attack Father        age 68s, deceased   Stroke Father    Cancer  Father        "at death determined he was ate up with cancer"   Hypertension Sister    Kidney cancer Sister 48   Diabetes Sister    Diabetes Brother    Hypertension Brother    Brain cancer Paternal Aunt    Cancer Paternal Uncle        NOS   Cancer Paternal Uncle        NOS   Cancer Paternal Uncle        NOS   Heart attack Maternal Grandfather    Endometriosis Daughter    Alcohol abuse Son    Drug abuse Son    Colon cancer Neg Hx     Social History:  Social History   Substance and Sexual Activity  Alcohol Use No     Social History   Substance and Sexual Activity  Drug Use No    Social History   Socioeconomic History   Marital status: Widowed    Spouse name: Not on file   Number of children: 2   Years of education: Not on file   Highest education level: Associate degree: occupational, Scientist, product/process development, or vocational program  Occupational History   Not on file  Tobacco Use   Smoking status: Never   Smokeless tobacco: Never  Vaping Use   Vaping status: Never Used  Substance and Sexual Activity   Alcohol use: No   Drug use: No   Sexual activity: Not Currently    Birth control/protection: Surgical    Comment: hyst  Other Topics Concern   Not on file  Social History Narrative   ** Merged History Encounter **       ** Data from: 04/28/23 Enc Dept: [facility]-PRE ADM TESTING   #  [place]; smoking- 1/2 ppd; no alcohol; used to run machines;   FHx-Dad- MI; ? Cancer on autopsy; sisters/aunts- brain; lung cancer x2; oldest sister- kidney cancer  Pt has 72 year old son;daughter 33 years. Brothers- states to have s   poken to her family re: importance of them being checked for lynch.        ** Data from: 05/04/23 Enc Dept: [facility]   Lives alone with 4 large dogs.    Social Drivers of Health   Financial Resource Strain: Medium Risk (12/07/2022)   Overall Financial Resource Strain (CARDIA)    Difficulty of Paying Living Expenses: Somewhat hard  Food  Insecurity: Food Insecurity Present (11/03/2023)   Hunger Vital Sign    Worried About Running Out of Food in the Last Year: Sometimes true    Ran Out of Food in the Last Year: Sometimes true  Transportation Needs: Unmet Transportation Needs (11/03/2023)   PRAPARE - Transportation    Lack of Transportation (Medical): Yes    Lack of Transportation (Non-Medical): Yes  Physical Activity: Inactive (12/07/2022)  Exercise Vital Sign    Days of Exercise per Week: 0 days    Minutes of Exercise per Session: 0 min  Stress: Stress Concern Present (12/07/2022)   [employer] of Occupational Health - Occupational Stress Questionnaire    Feeling of Stress : To some extent  Social Connections: Socially Isolated (11/03/2023)   Social Connection and Isolation Panel [NHANES]    Frequency of Communication with Friends and Family: More than three times a week    Frequency of Social Gatherings with Friends and Family: Once a week    Attends Religious Services: Never    Database administrator or Organizations: No    Attends Banker Meetings: Never    Marital Status: Widowed    Hospital Course:  The patient was admitted to Inpatient psychiatric treatment for stabilization of depression and suicide thoughts.  Patient was placed on suicidal precautions. The patient was evaluated and treated by the multidisciplinary treatment team including physicians, nurses, social workers and therapists. All medications were presented to the patient and the Patient gave consent to all the medications that they were given, as well as was explained the risks, benefits, side effects and alternatives of all medication therapies. The patient was integrated into the general milieu on the ward and encouraged to attend to her ADLs and participate in all groups and activities. During hospital course the Patient attended coping skill groups, music therapy and activity therapy groups. Patient was counseled on cognitive  techniques/skills by multiple staff members and given support care by the staff.   Patient's medication regimen was evaluated and titrated to therapeutic levels to better Patient's overall daily functioning. Specifically, the patient was started on aripiprazole, Buspar, and gabapentin. She tolerated the medication well with no significant side effects.  During hospitalization, she  reported she had talked to her son about her son moving out of the patient's house.  Patient said that" I have finally told him things had to change, he needs to move out".  Patient reports that she felt  better after disclosing this to her son.  Patient also said that she plans on surrendering her 5 dogs.    During the hospitalization, the patient demonstrated a stabilization of mood with decreased racing thoughts, decreased impulsivity, improved sleep and appetite. At the time of discharge, the patient denied any suicidal ideation/homicidal ideation and was not overtly depressed, manic or psychotic. The Patient was interacting well in groups and on the unit with their peers. Patient was able to identify a safety plan to include speaking with family, contacting outpatient provider or calling 911 if hallucinations/delusions returned or worsened or thoughts of self-harm or suicide return. Patient was counselled on outpatient follow-up that was arranged prior to discharge.  Psychiatric Specialty Exam:   Presentation  General Appearance:  Appropriate for Environment; Casual   Eye Contact: Fair   Speech: Clear and Coherent   Speech Volume: Normal       Mood and Affect  Mood: "Better"   Affect: More animated   Thought Process  Thought Processes: Coherent   Descriptions of Associations:Intact   Orientation:Full (Time, Place and Person)   Thought Content:Improving   Hallucinations:Denies   Ideas of Reference:None   Suicidal Thoughts: Denies   Homicidal Thoughts:Denies     Sensorium   Memory: Immediate Fair; Recent Fair; Remote Fair   Judgment: Improved   Insight: Improved     Executive Functions  Concentration: Fair   Attention Span: Fair   Recall: Fair  Fund of Knowledge: Fair   Language: Fair     Psychomotor Activity  Psychomotor Activity: Normal   Musculoskeletal: Strength & Muscle Tone: within normal limits Gait & Station: normal Assets  Assets: Manufacturing systems engineer; Desire for Improvement; Physical Health     Physical Exam: Physical Exam Vitals and nursing note reviewed.  HENT:     Head: Normocephalic.     Mouth/Throat:     Mouth: Mucous membranes are moist.  Eyes:     Pupils: Pupils are equal, round, and reactive to light.  Cardiovascular:     Rate and Rhythm: Normal rate.     Pulses: Normal pulses.  Pulmonary:     Breath sounds: Normal breath sounds.  Skin:    General: Skin is warm.  Neurological:     General: No focal deficit present.     Mental Status: She is alert and oriented to person, place, and time.      Review of Systems  Constitutional: Negative.   HENT: Negative.    Eyes: Negative.   Respiratory: Negative.    Cardiovascular: Negative.   Gastrointestinal: Negative.   Skin: Negative.   Neurological:  Negative for dizziness and speech change.   Blood pressure (!) 145/77, pulse 72, temperature 98.2 F (36.8 C), resp. rate 16, height 5\' 3"  (1.6 m), weight 45.6 kg, SpO2 100%. Body mass index is 17.8 kg/m.   Social History   Tobacco Use  Smoking Status Never  Smokeless Tobacco Never   Tobacco Cessation:  A prescription for an FDA-approved tobacco cessation medication provided at discharge   Blood Alcohol level:  Lab Results  Component Value Date   [facility] <10 11/03/2023   ETH <10 10/15/2023    Metabolic Disorder Labs:  Lab Results  Component Value Date   HGBA1C 5.9 (H) 05/03/2023   MPG 123 05/03/2023   No results found for: "PROLACTIN" Lab Results  Component Value Date   CHOL 149  05/03/2023   TRIG 66 05/03/2023   HDL 49 05/03/2023   CHOLHDL 3.0 05/03/2023   VLDL 13 05/03/2023   LDLCALC 87 05/03/2023    See Psychiatric Specialty Exam and Suicide Risk Assessment completed by Attending Physician prior to discharge.  Discharge destination:  Home  Is patient on multiple antipsychotic therapies at discharge:  No     Recommended Plan for Multiple Antipsychotic Therapies: NA   Allergies as of 11/13/2023       Reactions   Codeine Nausea And Vomiting, Rash        Medication List     STOP taking these medications    feeding supplement Liqd   loratadine 10 MG tablet Commonly known as: CLARITIN   montelukast 10 MG tablet Commonly known as: SINGULAIR   ondansetron 4 MG disintegrating tablet Commonly known as: ZOFRAN-ODT   sodium chloride 0.65 % nasal spray Commonly known as: OCEAN   Trulance 3 MG Tabs Generic drug: Plecanatide       TAKE these medications      Indication  albuterol 108 (90 Base) MCG/ACT inhaler Commonly known as: VENTOLIN HFA Inhale 2 puffs into the lungs every 2 (two) hours as needed for wheezing or shortness of breath (cough). What changed: Another medication with the same name was removed. Continue taking this medication, and follow the directions you see here.    ARIPiprazole 5 MG tablet Commonly known as: ABILIFY Take 1 tablet (5 mg total) by mouth at bedtime.    aspirin EC 81 MG tablet Take 1 tablet (81 mg total)  by mouth daily. Swallow whole. Start taking on: November 14, 2023    atorvastatin 40 MG tablet Commonly known as: LIPITOR Take 1 tablet (40 mg total) by mouth daily.    brimonidine 0.2 % ophthalmic solution Commonly known as: ALPHAGAN Place 1 drop into the left eye at bedtime. What changed: Another medication with the same name was added. Make sure you understand how and when to take each.    brimonidine 0.2 % ophthalmic solution Commonly known as: ALPHAGAN Place 1 drop into the left eye at  bedtime. What changed: You were already taking a medication with the same name, and this prescription was added. Make sure you understand how and when to take each.    brinzolamide 1 % ophthalmic suspension Commonly known as: AZOPT Place 1 drop into the left eye at bedtime.    busPIRone 7.5 MG tablet Commonly known as: BUSPAR Take 1 tablet (7.5 mg total) by mouth 2 (two) times daily. What changed:  medication strength how much to take    clopidogrel 75 MG tablet Commonly known as: PLAVIX Take 1 tablet (75 mg total) by mouth daily. Start taking on: November 14, 2023    docusate sodium 100 MG capsule Commonly known as: COLACE Take 1 capsule (100 mg total) by mouth 2 (two) times daily. What changed: Another medication with the same name was added. Make sure you understand how and when to take each.    docusate sodium 100 MG capsule Commonly known as: COLACE Take 1 capsule (100 mg total) by mouth daily as needed (mild constipation). What changed: You were already taking a medication with the same name, and this prescription was added. Make sure you understand how and when to take each.    escitalopram 20 MG tablet Commonly known as: LEXAPRO Take 1 tablet (20 mg total) by mouth at bedtime.    ferrous sulfate 325 (65 FE) MG tablet Take 1 tablet (325 mg total) by mouth every other day.    fluticasone 50 MCG/ACT nasal spray Commonly known as: FLONASE Place 1 spray into both nostrils daily.    gabapentin 100 MG capsule Commonly known as: NEURONTIN Take 1 capsule (100 mg total) by mouth daily.    gabapentin 100 MG capsule Commonly known as: NEURONTIN Take 2 capsules (200 mg total) by mouth at bedtime.    hydrOXYzine 25 MG tablet Commonly known as: ATARAX Take 1 tablet (25 mg total) by mouth 3 (three) times daily as needed for anxiety.    latanoprost 0.005 % ophthalmic solution Commonly known as: XALATAN Place 1 drop into the left eye at bedtime. What changed: Another  medication with the same name was added. Make sure you understand how and when to take each.    latanoprost 0.005 % ophthalmic solution Commonly known as: XALATAN Place 1 drop into the left eye at bedtime. What changed: You were already taking a medication with the same name, and this prescription was added. Make sure you understand how and when to take each.    linaclotide 145 MCG Caps capsule Commonly known as: LINZESS Take 1 capsule (145 mcg total) by mouth daily before breakfast. Start taking on: November 14, 2023    meclizine 25 MG tablet Commonly known as: ANTIVERT Take 1 tablet (25 mg total) by mouth 3 (three) times daily as needed for dizziness.    methocarbamol 500 MG tablet Commonly known as: ROBAXIN Take 1 tablet (500 mg total) by mouth every 8 (eight) hours as needed for muscle spasms.  multivitamin with minerals Tabs tablet Take 1 tablet by mouth daily. Start taking on: November 14, 2023    nicotine 14 mg/24hr patch Commonly known as: NICODERM CQ - dosed in mg/24 hours Place 1 patch (14 mg total) onto the skin daily. Start taking on: November 14, 2023    pantoprazole 40 MG tablet Commonly known as: PROTONIX Take 1 tablet (40 mg total) by mouth daily before breakfast.    senna-docusate 8.6-50 MG tablet Commonly known as: Senokot-S Take 2 tablets by mouth 2 (two) times daily.    traZODone 100 MG tablet Commonly known as: DESYREL Take 1 tablet (100 mg total) by mouth at bedtime as needed for sleep. What changed:  medication strength how much to take when to take this reasons to take this         Follow-up Information     [facility] Follow up.   Why: To initiate services, you must do a walk in appointment. Walk-in hours are from 8:00 AM - 5:00 PM Contact information: [address] [place] [address] [phone]         [facility] Follow up.   Why: Your appointment is scheduled for 2/26 at 3 via the phone and will receive a  call at [phone] Contact information: [address]  [address] [place] [address] [phone]                PATIENTS CONDITION AT DISCHARGE: Stable TOBACCO CESSATION SCREENING  Patient was screened and counselled on smoking cessation at time of discharge.    PRESCRIPTION ARE LOCATED: On Chart  DISCHARGE INSTRUCTIONS: Diet: Cardiac healthy Activity: As tolerated Take medications as prescribed and not to make any changes without first consulting with the outpatient provider. Patient was advised to avoid any illicit drugs or alcohol due to negative impact on physical and mental health.  Patient should keep all follow up appointments.  TIME SPENT ON DISCHARGE: Over 35 minutes were spent on this patient's discharge including a face-to-face encounter, patient counseling, and preparation of discharge materials.     Signed: Lewanda Rife, MD 11/13/2023, 10:14 AM

## 2023-11-16 DIAGNOSIS — H401112 Primary open-angle glaucoma, right eye, moderate stage: Secondary | ICD-10-CM | POA: Diagnosis not present

## 2023-11-16 DIAGNOSIS — H04123 Dry eye syndrome of bilateral lacrimal glands: Secondary | ICD-10-CM | POA: Diagnosis not present

## 2023-11-16 DIAGNOSIS — H2513 Age-related nuclear cataract, bilateral: Secondary | ICD-10-CM | POA: Diagnosis not present

## 2023-11-16 DIAGNOSIS — H401121 Primary open-angle glaucoma, left eye, mild stage: Secondary | ICD-10-CM | POA: Diagnosis not present

## 2023-11-16 DIAGNOSIS — H16223 Keratoconjunctivitis sicca, not specified as Sjogren's, bilateral: Secondary | ICD-10-CM | POA: Diagnosis not present

## 2023-11-17 DIAGNOSIS — R55 Syncope and collapse: Secondary | ICD-10-CM | POA: Diagnosis not present

## 2023-11-17 DIAGNOSIS — R6889 Other general symptoms and signs: Secondary | ICD-10-CM | POA: Diagnosis not present

## 2023-11-17 DIAGNOSIS — R531 Weakness: Secondary | ICD-10-CM | POA: Diagnosis not present

## 2023-11-17 DIAGNOSIS — R41840 Attention and concentration deficit: Secondary | ICD-10-CM | POA: Diagnosis not present

## 2023-11-23 ENCOUNTER — Telehealth: Payer: Self-pay | Admitting: *Deleted

## 2023-11-23 NOTE — Telephone Encounter (Signed)
 Per  patient she did not go and she wants to see if someone would help to set it back up

## 2023-11-24 NOTE — Telephone Encounter (Signed)
 Called and let pt know that she can call [phone] and ask fr Marylu Lund and tell her that you missed the day to get port out and need another date ad time. Pt. Ok with that

## 2023-12-01 ENCOUNTER — Ambulatory Visit: Payer: Medicare HMO | Admitting: Dermatology

## 2023-12-01 NOTE — Progress Notes (Deleted)
  Cardiology Office Note:  .   Date:  12/01/2023  ID:  Julia Osborne, DOB November 09, 1960, MRN [id number] PCP: Ailene Ravel, MD  [facility] Providers Cardiologist:  Truett Mainland, MD PCP: Ailene Ravel, MD  No chief complaint on file.     History of Present Illness: .    Julia Osborne is a 63 y.o. female with ***  There were no vitals filed for this visit.   ROS: *** ROS   Studies Reviewed: .        *** Independently interpreted ***/202***: Chol ***, TG ***, HDL ***, LDL *** HbA1C ***% Hb *** Cr *** ***  Risk Assessment/Calculations:   {Does this patient have ATRIAL FIBRILLATION?:[phone]}     Physical Exam:   Physical Exam   VISIT DIAGNOSES:   ICD-10-CM   1. Lightheadedness  R42        ASSESSMENT AND PLAN: .    Julia Osborne is a 63 y.o. female with ***  {Are you ordering a CV Procedure (e.g. stress test, cath, DCCV, TEE, etc)?   Press F2        :[id number]}    No orders of the defined types were placed in this encounter.    F/u in ***  Signed, Elder Negus, MD

## 2023-12-02 ENCOUNTER — Ambulatory Visit: Payer: Medicare HMO | Attending: Cardiology | Admitting: Cardiology

## 2023-12-02 DIAGNOSIS — R42 Dizziness and giddiness: Secondary | ICD-10-CM

## 2023-12-03 ENCOUNTER — Encounter: Payer: Self-pay | Admitting: Cardiology

## 2023-12-13 ENCOUNTER — Ambulatory Visit: Payer: Self-pay | Admitting: Psychiatry

## 2023-12-14 DIAGNOSIS — M25551 Pain in right hip: Secondary | ICD-10-CM | POA: Diagnosis not present

## 2023-12-14 DIAGNOSIS — M7061 Trochanteric bursitis, right hip: Secondary | ICD-10-CM | POA: Diagnosis not present

## 2023-12-14 DIAGNOSIS — F411 Generalized anxiety disorder: Secondary | ICD-10-CM | POA: Diagnosis not present

## 2023-12-14 DIAGNOSIS — F331 Major depressive disorder, recurrent, moderate: Secondary | ICD-10-CM | POA: Diagnosis not present

## 2023-12-15 ENCOUNTER — Other Ambulatory Visit: Payer: Self-pay

## 2023-12-15 NOTE — Progress Notes (Signed)
 Patient for IR Port Removal on Thurs 12/16/23, I called and spoke with the patient on the phone and gave pre-procedure instructions. Pt was made aware to be here at 2p and check in at the new entrance. Pt stated understanding.  Called 12/14/24

## 2023-12-16 ENCOUNTER — Ambulatory Visit
Admission: RE | Admit: 2023-12-16 | Discharge: 2023-12-16 | Disposition: A | Discharge status: Home / Self Care | Source: Ambulatory Visit | Attending: Internal Medicine | Admitting: Internal Medicine

## 2023-12-16 DIAGNOSIS — Z452 Encounter for adjustment and management of vascular access device: Secondary | ICD-10-CM | POA: Diagnosis not present

## 2023-12-16 DIAGNOSIS — Z95828 Presence of other vascular implants and grafts: Secondary | ICD-10-CM

## 2023-12-16 HISTORY — PX: IR REMOVAL TUN ACCESS W/ PORT W/O FL MOD SED: IMG2290

## 2023-12-16 MED ORDER — LIDOCAINE-EPINEPHRINE 1 %-1:100000 IJ SOLN
INTRAMUSCULAR | Status: AC
  Filled 2023-12-16: qty 1

## 2023-12-16 MED ORDER — LIDOCAINE-EPINEPHRINE 1 %-1:100000 IJ SOLN
20.0000 mL | Freq: Once | INTRAMUSCULAR | Status: AC
  Administered 2023-12-16: 20 mL via INTRADERMAL

## 2023-12-16 NOTE — Procedures (Signed)
 Vascular and Interventional Radiology Procedure Note  Patient: Julia Osborne DOB: March 30, 1961 Medical Record Number: [id number] Note Date/Time: 12/16/23 2:13 PM   Performing Physician: Roanna Banning, MD Assistant(s): None  Diagnosis:  RX completion.  Procedure: PORT PLACEMENT  Anesthesia: Local Anesthetic Complications: None Estimated Blood Loss: Minimal  Findings:  Successful right-sided port placement, with the tip of the catheter in the proximal right atrium.  Plan: Catheter ready for use.  See detailed procedure note with images in PACS. The patient tolerated the procedure well without incident or complication and was returned to Recovery in stable condition.    Roanna Banning, MD [facility]   Pager. [phone] Clinic. [phone]

## 2023-12-20 NOTE — Progress Notes (Unsigned)
 No show

## 2023-12-21 ENCOUNTER — Ambulatory Visit: Admitting: Gastroenterology

## 2023-12-27 ENCOUNTER — Ambulatory Visit: Payer: Medicare HMO | Admitting: Psychiatry

## 2023-12-30 DIAGNOSIS — Z79899 Other long term (current) drug therapy: Secondary | ICD-10-CM | POA: Diagnosis not present

## 2023-12-30 DIAGNOSIS — E785 Hyperlipidemia, unspecified: Secondary | ICD-10-CM | POA: Diagnosis not present

## 2024-01-05 ENCOUNTER — Inpatient Hospital Stay: Payer: Medicare HMO | Attending: Internal Medicine

## 2024-01-06 ENCOUNTER — Ambulatory Visit (INDEPENDENT_AMBULATORY_CARE_PROVIDER_SITE_OTHER): Payer: Medicare HMO | Admitting: Professional Counselor

## 2024-01-06 DIAGNOSIS — Z91199 Patient's noncompliance with other medical treatment and regimen due to unspecified reason: Secondary | ICD-10-CM

## 2024-01-06 NOTE — Progress Notes (Signed)
 Patient no-showed today's appointment; appointment was for 01/06/24 to establish outpatient therapy.

## 2024-01-25 ENCOUNTER — Telehealth: Payer: Self-pay

## 2024-01-25 NOTE — Telephone Encounter (Signed)
 Patient will be due after May 28th to have her yearly repeat colonoscopy due to her history of hereditary nonpolyposis. She has been having her colonoscopy w/EGD with Dr. Antony Baumgartner yearly since 2020.  I contacted her to let her know that Dr. Antony Baumgartner will be transitioning to [facility] next month and asked if she wanted to continue her GI Care with him at [facility]. She said she would like to continue to be his patient. Informed her that I will message her PCP and ask for a referral to be sent to Dr. Antony Baumgartner for continuation of care and schedule colonoscopy w/EGD which will be due after May 28th.  Faxed to [facility]  O# [phone] F# [phone]  Thanks,  Moira Andrews, CMA

## 2024-01-27 ENCOUNTER — Other Ambulatory Visit: Payer: Self-pay

## 2024-02-01 ENCOUNTER — Ambulatory Visit: Admitting: Gastroenterology

## 2024-02-01 NOTE — Progress Notes (Deleted)
 Luke Salaam MD, MRCP(U.K) [address]  [address]  [place], [place] [address]  Main: [phone]  Fax: [phone]   Primary Care Physician: Annette Barters, MD  Primary Gastroenterologist:  Dr. Luke Salaam   No chief complaint on file.   HPI: Julia Osborne is a 63 y.o. female Summary of history :   Initially  seen in February 2020 for dysphagia and Lynch syndrome.  She has a history of endometrial cancer, colon cancer, GERD.  She follows with Dr. Valentine Gasmen  in oncology. She has been previously established with [facility] gastroenterology.  Carries a history of dysphagia, GERD and CA of her colon, status post right hemicolectomy.  History of stage IV uterine cancer.  Colon polyp resected in October 2016 showed an adenoma with focal high-grade dysplasia.  10/24/18: H pylori - negative 11/03/2018: EGD: Normal duodenal . Gastric bx shows mild chronic gastritis .  Colonoscopy : Hyperplastic rectal polyp. 12/25/2019: Capsule study of the small bowel showed a few tiny polyps in the proximal small bowel were noted.  Probably 2 to 3 mm in size.  A few lesions suggestive of small bowel AVMs were seen in the distal small bowel.  Nonbleeding.  02/12/2020: Push enteroscopy polyps in the first portion of the duodenum were noted 2 to 4 mm in size.  Resected and retrieved.  Biopsy report demonstrated peptic duodenitis  10/28/2020: CT scan of the abdomen and pelvis with contrast no evidence of recurring malignancy 12/09/2020: Colonoscopy: Large amount of stool seen in the rectum sigmoid colon interfering with visualization, repeat colonoscopy on 01/20/2021 8 mm polyp resected in the sigmoid colon otherwise examination was normal.  The polyp was a sessile serrated adenoma  11/28/2020: Urine analysis no RBCs seen  12/10/2021: Colonoscopy showed no polyps.  EGD the showed some nodularity in the gastric antrum biopsies were taken was also performed on the same day.  It showed intestinal metaplasia in 2-3  fragments.  No dysplasia.  She was recommended EGD with gastric mapping in 6 months 12/03/2022 urine analysis shows no abnormal cells. 01/19/2023 CT scan of the chest abdomen pelvis with contrast showed pulmonary nodules cholelithiasis otherwise no other abnormality.    Interval history 01/21/2023-02/01/2024   02/16/2023: EGD: Plaques found in the esophagus suspicious for candidiasis brushings performed, change in mucosal pattern in the lesser curvature of the stomach biopsied normal mucosa in the incisura and antrum biopsied.  Biopsies showed no significant abnormality mostly some fragments showed minimal inflammation.  Colonoscopy was normal.   Admitted in February for suicidal ideation follows with oncology.  January 2025 seen by Dr. Valentine Gasmen having some falls.  20 pounds weight lost.  11/03/2023: Hemoglobin 12.4 g, CMP normal. 11/03/2023: Urinalysis normal  has a history of unintentional weight loss which is being followed by oncology she has been referred to dermatology for skin cancer screening .    Current Outpatient Medications  Medication Sig Dispense Refill   albuterol  (VENTOLIN  HFA) 108 (90 Base) MCG/ACT inhaler 1 puff DAILY (route: inhalation)     ARIPiprazole  (ABILIFY ) 5 MG tablet Take 1 tablet (5 mg total) by mouth at bedtime. 30 tablet 0   aspirin  EC 81 MG tablet Take 1 tablet (81 mg total) by mouth daily. Swallow whole. 30 tablet 12   busPIRone  (BUSPAR ) 7.5 MG tablet Take 1 tablet (7.5 mg total) by mouth 2 (two) times daily. 60 tablet 0   Cyanocobalamin  1000 MCG CAPS 1000 mcg DAILY (route: sublingual)     docusate sodium  (COLACE)  100 MG capsule Take 1 capsule (100 mg total) by mouth 2 (two) times daily. (Patient not taking: Reported on 10/28/2023) 30 capsule 0   docusate sodium  (COLACE) 100 MG capsule Take 1 capsule (100 mg total) by mouth daily as needed (mild constipation). 10 capsule 0   escitalopram  (LEXAPRO ) 20 MG tablet Take 1 tablet (20 mg total) by mouth at bedtime. 30 tablet  0   fluticasone  (FLONASE ) 50 MCG/ACT nasal spray Place 1 spray into both nostrils daily. 1 g 0   gabapentin  (NEURONTIN ) 100 MG capsule Take 1 capsule (100 mg total) by mouth daily. 30 capsule 0   gabapentin  (NEURONTIN ) 100 MG capsule Take 2 capsules (200 mg total) by mouth at bedtime. 60 capsule 0   hydrOXYzine  (ATARAX ) 25 MG tablet Take 1 tablet (25 mg total) by mouth 3 (three) times daily as needed for anxiety. 30 tablet 0   linaclotide  (LINZESS ) 145 MCG CAPS capsule Take 1 capsule (145 mcg total) by mouth daily before breakfast. 30 capsule 0   meclizine  (ANTIVERT ) 25 MG tablet Take 1 tablet (25 mg total) by mouth 3 (three) times daily as needed for dizziness. 30 tablet 0   methocarbamol  (ROBAXIN ) 500 MG tablet Take 1 tablet (500 mg total) by mouth every 8 (eight) hours as needed for muscle spasms. 30 tablet 0   Multiple Vitamin (MULTIVITAMIN WITH MINERALS) TABS tablet Take 1 tablet by mouth daily. 30 tablet 0   nicotine  (NICODERM CQ  - DOSED IN MG/24 HOURS) 14 mg/24hr patch Place 1 patch (14 mg total) onto the skin daily. 28 patch 0   NON FORMULARY Apply 1 Application topically.     pantoprazole  (PROTONIX ) 40 MG tablet Take 1 tablet (40 mg total) by mouth daily before breakfast. 15 tablet 0   senna-docusate (SENOKOT-S) 8.6-50 MG tablet Take 2 tablets by mouth 2 (two) times daily. 30 tablet 0   No current facility-administered medications for this visit.    Allergies as of 02/01/2024 - Reviewed 12/16/2023  Allergen Reaction Noted   Codeine Nausea And Vomiting and Rash 05/02/2023      ROS:  General: Negative for anorexia, weight loss, fever, chills, fatigue, weakness. ENT: Negative for hoarseness, difficulty swallowing , nasal congestion. CV: Negative for chest pain, angina, palpitations, dyspnea on exertion, peripheral edema.  Respiratory: Negative for dyspnea at rest, dyspnea on exertion, cough, sputum, wheezing.  GI: See history of present illness. GU:  Negative for dysuria,  hematuria, urinary incontinence, urinary frequency, nocturnal urination.  Endo: Negative for unusual weight change.    Physical Examination:   There were no vitals taken for this visit.  General: Well-nourished, well-developed in no acute distress.  Eyes: No icterus. Conjunctivae pink. Mouth: Oropharyngeal mucosa moist and pink , no lesions erythema or exudate. Lungs: Clear to auscultation bilaterally. Non-labored. Heart: Regular rate and rhythm, no murmurs rubs or gallops.  Abdomen: Bowel sounds are normal, nontender, nondistended, no hepatosplenomegaly or masses, no abdominal bruits or hernia , no rebound or guarding.   Extremities: No lower extremity edema. No clubbing or deformities. Neuro: Alert and oriented x 3.  Grossly intact. Skin: Warm and dry, no jaundice.   Psych: Alert and cooperative, normal mood and affect.   Imaging Studies: No results found.  Assessment and Plan:   Julia Osborne is a 63 y.o. y/o female  here to follow up for abdominal pain.  She has a history of Lynch syndrome.   History of colon cancer with right hemicolectomy, uterine cancer.  History of IBS constipation  responded well to Trulance  in the past but not requiring any presently.  She has had significant unintentional weight loss with no abnormalities detected presently except for pulmonary nodules on recent CT scan.     Plan  She requires annual skin exam , outpatient colonoscopy and EGD .  Annual urine exam in March 2026 3.  Unintentional weight loss follows with oncology      Dr Luke Salaam  MD,MRCP [facility]) Follow up in ***

## 2024-02-29 ENCOUNTER — Ambulatory Visit: Attending: Cardiology | Admitting: Cardiology

## 2024-02-29 NOTE — Progress Notes (Deleted)
  Cardiology Office Note:  .   Date:  02/29/2024  ID:  Julia Osborne, DOB 08-26-1961, MRN [id number] PCP: Annette Barters, MD  [facility] Providers Cardiologist:  Fransico Ivy, MD PCP: Annette Barters, MD  No chief complaint on file.    Julia Osborne is a 63 y.o. female with *** Discussed the use of AI scribe software for clinical note transcription with the patient, who gave verbal consent to proceed.  History of Present Illness       There were no vitals filed for this visit.    ROS      Studies Reviewed: .        *** Independently interpreted 04/2023-10/2023: Chol 149, TG 66, HDL 49, LDL 87 HbA1C 5.9% Hb 12.4 Cr 0.72   Risk Assessment/Calculations:   {Does this patient have ATRIAL FIBRILLATION?:[id number]}    Physical Exam   VISIT DIAGNOSES: No diagnosis found.   Julia Osborne is a 63 y.o. female with *** Assessment and Plan Assessment & Plan       {Are you ordering a CV Procedure (e.g. stress test, cath, DCCV, TEE, etc)?   Press F2        :[id number]}    No orders of the defined types were placed in this encounter.    F/u in ***  Signed, Cody Das, MD

## 2024-03-01 DIAGNOSIS — H401112 Primary open-angle glaucoma, right eye, moderate stage: Secondary | ICD-10-CM | POA: Diagnosis not present

## 2024-03-01 DIAGNOSIS — H04123 Dry eye syndrome of bilateral lacrimal glands: Secondary | ICD-10-CM | POA: Diagnosis not present

## 2024-03-01 DIAGNOSIS — H16223 Keratoconjunctivitis sicca, not specified as Sjogren's, bilateral: Secondary | ICD-10-CM | POA: Diagnosis not present

## 2024-03-01 DIAGNOSIS — H401121 Primary open-angle glaucoma, left eye, mild stage: Secondary | ICD-10-CM | POA: Diagnosis not present

## 2024-03-01 DIAGNOSIS — H2513 Age-related nuclear cataract, bilateral: Secondary | ICD-10-CM | POA: Diagnosis not present

## 2024-03-14 DIAGNOSIS — Z1509 Genetic susceptibility to other malignant neoplasm: Secondary | ICD-10-CM | POA: Diagnosis not present

## 2024-03-14 DIAGNOSIS — Z08 Encounter for follow-up examination after completed treatment for malignant neoplasm: Secondary | ICD-10-CM | POA: Diagnosis not present

## 2024-03-14 DIAGNOSIS — Z0489 Encounter for examination and observation for other specified reasons: Secondary | ICD-10-CM | POA: Diagnosis not present

## 2024-03-14 DIAGNOSIS — Z85038 Personal history of other malignant neoplasm of large intestine: Secondary | ICD-10-CM | POA: Diagnosis not present

## 2024-03-21 ENCOUNTER — Ambulatory Visit
Admission: RE | Admit: 2024-03-21 | Discharge: 2024-03-21 | Disposition: A | Discharge status: Home / Self Care | Payer: Medicare HMO | Source: Ambulatory Visit | Attending: Internal Medicine | Admitting: Internal Medicine

## 2024-03-21 DIAGNOSIS — K8020 Calculus of gallbladder without cholecystitis without obstruction: Secondary | ICD-10-CM | POA: Diagnosis not present

## 2024-03-21 DIAGNOSIS — I700 Atherosclerosis of aorta: Secondary | ICD-10-CM | POA: Diagnosis not present

## 2024-03-21 DIAGNOSIS — C541 Malignant neoplasm of endometrium: Secondary | ICD-10-CM | POA: Diagnosis not present

## 2024-03-21 DIAGNOSIS — Z90710 Acquired absence of both cervix and uterus: Secondary | ICD-10-CM | POA: Diagnosis not present

## 2024-03-21 MED ORDER — IOHEXOL 300 MG/ML  SOLN
80.0000 mL | Freq: Once | INTRAMUSCULAR | Status: AC | PRN
  Administered 2024-03-21: 80 mL via INTRAVENOUS

## 2024-03-21 MED ORDER — IOHEXOL 9 MG/ML PO SOLN
500.0000 mL | ORAL | Status: AC
  Administered 2024-03-21 (×2): 500 mL via ORAL

## 2024-03-28 ENCOUNTER — Encounter
Admission: RE | Disposition: A | Discharge status: Home / Self Care | Payer: Self-pay | Source: Home / Self Care | Attending: Gastroenterology

## 2024-03-28 ENCOUNTER — Other Ambulatory Visit: Payer: Self-pay

## 2024-03-28 ENCOUNTER — Ambulatory Visit
Admission: RE | Admit: 2024-03-28 | Discharge: 2024-03-28 | Disposition: A | Discharge status: Home / Self Care | Attending: Gastroenterology | Admitting: Gastroenterology

## 2024-03-28 ENCOUNTER — Encounter: Payer: Self-pay | Admitting: Gastroenterology

## 2024-03-28 ENCOUNTER — Ambulatory Visit: Admitting: Certified Registered"

## 2024-03-28 DIAGNOSIS — Z980 Intestinal bypass and anastomosis status: Secondary | ICD-10-CM | POA: Diagnosis not present

## 2024-03-28 DIAGNOSIS — Z1211 Encounter for screening for malignant neoplasm of colon: Secondary | ICD-10-CM | POA: Insufficient documentation

## 2024-03-28 DIAGNOSIS — Z5982 Transportation insecurity: Secondary | ICD-10-CM | POA: Insufficient documentation

## 2024-03-28 DIAGNOSIS — Z85038 Personal history of other malignant neoplasm of large intestine: Secondary | ICD-10-CM | POA: Insufficient documentation

## 2024-03-28 DIAGNOSIS — F419 Anxiety disorder, unspecified: Secondary | ICD-10-CM | POA: Insufficient documentation

## 2024-03-28 DIAGNOSIS — K5730 Diverticulosis of large intestine without perforation or abscess without bleeding: Secondary | ICD-10-CM | POA: Diagnosis not present

## 2024-03-28 DIAGNOSIS — K219 Gastro-esophageal reflux disease without esophagitis: Secondary | ICD-10-CM | POA: Insufficient documentation

## 2024-03-28 DIAGNOSIS — Z79899 Other long term (current) drug therapy: Secondary | ICD-10-CM | POA: Insufficient documentation

## 2024-03-28 DIAGNOSIS — J4489 Other specified chronic obstructive pulmonary disease: Secondary | ICD-10-CM | POA: Diagnosis not present

## 2024-03-28 DIAGNOSIS — Z1509 Genetic susceptibility to other malignant neoplasm: Secondary | ICD-10-CM | POA: Insufficient documentation

## 2024-03-28 DIAGNOSIS — F418 Other specified anxiety disorders: Secondary | ICD-10-CM | POA: Diagnosis not present

## 2024-03-28 DIAGNOSIS — F17210 Nicotine dependence, cigarettes, uncomplicated: Secondary | ICD-10-CM | POA: Insufficient documentation

## 2024-03-28 DIAGNOSIS — F32A Depression, unspecified: Secondary | ICD-10-CM | POA: Insufficient documentation

## 2024-03-28 DIAGNOSIS — Z5986 Financial insecurity: Secondary | ICD-10-CM | POA: Insufficient documentation

## 2024-03-28 HISTORY — PX: COLONOSCOPY: SHX5424

## 2024-03-28 SURGERY — COLONOSCOPY
Anesthesia: General

## 2024-03-28 MED ORDER — SODIUM CHLORIDE 0.9 % IV SOLN: INTRAVENOUS | Status: DC

## 2024-03-28 MED ORDER — GLYCOPYRROLATE 0.2 MG/ML IJ SOLN
INTRAMUSCULAR | Status: DC | PRN
  Administered 2024-03-28: .2 mg via INTRAVENOUS

## 2024-03-28 MED ORDER — LIDOCAINE HCL (CARDIAC) PF 100 MG/5ML IV SOSY
PREFILLED_SYRINGE | INTRAVENOUS | Status: DC | PRN
Start: 2024-03-28 — End: 2024-03-28
  Administered 2024-03-28: 50 mg via INTRAVENOUS

## 2024-03-28 MED ORDER — DEXMEDETOMIDINE HCL IN NACL 80 MCG/20ML IV SOLN
INTRAVENOUS | Status: DC | PRN
Start: 2024-03-28 — End: 2024-03-28
  Administered 2024-03-28: 4 ug via INTRAVENOUS

## 2024-03-28 MED ORDER — PROPOFOL 10 MG/ML IV BOLUS
INTRAVENOUS | Status: DC | PRN
Start: 2024-03-28 — End: 2024-03-28
  Administered 2024-03-28: 30 mg via INTRAVENOUS
  Administered 2024-03-28: 50 mg via INTRAVENOUS

## 2024-03-28 MED ORDER — PROPOFOL 500 MG/50ML IV EMUL
INTRAVENOUS | Status: DC | PRN
  Administered 2024-03-28: 150 ug/kg/min via INTRAVENOUS

## 2024-03-28 NOTE — OR Nursing (Signed)
 Per pt, family can wait in lobby until discharge

## 2024-03-28 NOTE — Op Note (Signed)
 [facility] Gastroenterology Patient Name: Julia Osborne Procedure Date: 03/28/2024 8:37 AM MRN: [id number] Account #: [id number] Date of Birth: [birth year] Admit Type: Inpatient Age: 63 Room: [facility] ENDO ROOM 3 Gender: Female Note Status: Finalized Instrument Name: Peds Colonoscope [id number] Procedure:             Colonoscopy Indications:           High risk colon cancer surveillance: Personal history                         of hereditary nonpolyposis colorectal cancer (Lynch                         Syndrome) Providers:             Ruel Kung MD, MD Referring MD:          Charlene CROME. Hamrick (Referring MD) Medicines:             Monitored Anesthesia Care Complications:         No immediate complications. Procedure:             Pre-Anesthesia Assessment:                        - Prior to the procedure, a History and Physical was                         performed, and patient medications, allergies and                         sensitivities were reviewed. The patient's tolerance                         of previous anesthesia was reviewed.                        - The risks and benefits of the procedure and the                         sedation options and risks were discussed with the                         patient. All questions were answered and informed                         consent was obtained.                        After obtaining informed consent, the colonoscope was                         passed under direct vision. Throughout the procedure,                         the patient's blood pressure, pulse, and oxygen                         saturations were monitored continuously. The                         Colonoscope was introduced through  the anus and                         advanced to the the ileocolonic anastomosis. The                         colonoscopy was performed without difficulty. The                         patient tolerated the procedure well. The  quality of                         the bowel preparation was excellent. Anatomical                         landmarks were photographed. Findings:      The perianal and digital rectal examinations were normal.      There was evidence of a prior end-to-side ileo-colonic anastomosis in       the transverse colon. This was patent and was characterized by healthy       appearing mucosa.      The exam was otherwise without abnormality on direct and retroflexion       views. Impression:            - Patent end-to-side ileo-colonic anastomosis,                         characterized by healthy appearing mucosa.                        - The examination was otherwise normal on direct and                         retroflexion views.                        - No specimens collected. Recommendation:        - Discharge patient to home (with escort).                        - Resume previous diet.                        - Continue present medications.                        - Repeat colonoscopy in 1 year for surveillance. Procedure Code(s):     --- Professional ---                        [phone], Colonoscopy, flexible; diagnostic, including                         collection of specimen(s) by brushing or washing, when                         performed (separate procedure) Diagnosis Code(s):     --- Professional ---                        S14.961, Personal history of other malignant neoplasm  of large intestine                        Z15.09, Genetic susceptibility to other malignant                         neoplasm                        Z98.0, Intestinal bypass and anastomosis status CPT copyright 2022 [employer]. All rights reserved. The codes documented in this report are preliminary and upon coder review may  be revised to meet current compliance requirements. Ruel Kung, MD Ruel Kung MD, MD 03/28/2024 9:06:16 AM This report has been signed  electronically. Number of Addenda: 0 Note Initiated On: 03/28/2024 8:37 AM Scope Withdrawal Time: 0 hours 8 minutes 57 seconds  Total Procedure Duration: 0 hours 12 minutes 11 seconds  Estimated Blood Loss:  Estimated blood loss: none.      [facility]

## 2024-03-28 NOTE — Transfer of Care (Signed)
 Immediate Anesthesia Transfer of Care Note  Patient: Julia Osborne  Procedure(s) Performed: COLONOSCOPY  Patient Location: PACU and Endoscopy Unit  Anesthesia Type:General  Level of Consciousness: drowsy and patient cooperative  Airway & Oxygen Therapy: Patient Spontanous Breathing  Post-op Assessment: Report given to RN and Post -op Vital signs reviewed and stable  Post vital signs: Reviewed and stable  Last Vitals:  Vitals Value Taken Time  BP 102/61 03/28/24 09:07  Temp    Pulse 61 03/28/24 09:07  Resp 14 03/28/24 09:07  SpO2 97 % 03/28/24 09:07  Vitals shown include unfiled device data.  Last Pain:  Vitals:   03/28/24 0757  TempSrc: Temporal  PainSc: 0-No pain         Complications: No notable events documented.

## 2024-03-28 NOTE — Anesthesia Preprocedure Evaluation (Signed)
 Anesthesia Evaluation  Patient identified by MRN, date of birth, ID band Patient awake    Reviewed: Allergy & Precautions, H&P , NPO status , Patient's Chart, lab work & pertinent test results, reviewed documented beta blocker date and time   History of Anesthesia Complications (+) PONV and history of anesthetic complications  Airway Mallampati: II   Neck ROM: full    Dental  (+) Poor Dentition   Pulmonary asthma , COPD, Patient abstained from smoking.   Pulmonary exam normal        Cardiovascular Exercise Tolerance: Good negative cardio ROS Normal cardiovascular exam Rhythm:regular Rate:Normal     Neuro/Psych Seizures -, Well Controlled,   Anxiety Depression    TIA negative psych ROS   GI/Hepatic Neg liver ROS,GERD  Medicated,,  Endo/Other  negative endocrine ROS    Renal/GU negative Renal ROS  negative genitourinary   Musculoskeletal   Abdominal   Peds  Hematology  (+) Blood dyscrasia, anemia   Anesthesia Other Findings Past Medical History: No date: Abdominal pain, acute, left upper quadrant No date: Abdominal pain, epigastric No date: Abnormal CT scan, sigmoid colon No date: Abnormal vaginal Pap smear 09/13/2015: Adenocarcinoma of colon (HCC)     Comment:  Partial colon resection and chemo tx's.  09/23/2023: Ambulatory dysfunction No date: Anemia No date: Anxiety No date: Anxiety disorder, unspecified No date: Aphasia No date: Asthma No date: Cancer [facility])     Comment:  endometrial; cancer cells in intestine No date: Chronic pain of left knee No date: Colon neoplasm No date: Constipation No date: Conversion reaction No date: COPD (chronic obstructive pulmonary disease) (HCC)     Comment:  no definite diagnosis No date: Depression 10/08/2023: Diarrhea, unspecified No date: Diverticulitis No date: Diverticulosis of colon without hemorrhage 05/16/2015: Dyspareunia No date: Dysphasia No date:  Endometrial cancer (HCC) No date: Esophageal dysphagia No date: Family history of cancer No date: Family history of kidney cancer No date: Ganglion of left knee No date: Gastric intestinal metaplasia 09/14/2023: Generalized muscle weakness No date: GERD (gastroesophageal reflux disease) No date: Glaucoma No date: Hematochezia No date: History of colonic polyps 09/21/2022: History of falling No date: Hyperlipidemia 09/14/2023: Impaired mobility and ADLs No date: Indigestion 09/21/2022: Long term (current) use of antithrombotics/antiplatelets No date: Lynch syndrome No date: Mucosal abnormality of stomach No date: Nausea and vomiting No date: Neuropathy     Comment:  feet and hands No date: 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 drug (HCC) 10/08/2023: Other sequelae of cerebral infarction No date: PONV (postoperative nausea and vomiting) No date: Pre-diabetes No date: Primary osteoarthritis of left knee No date: Rectal bleeding No date: Reflux esophagitis No date: Soft tissue swelling of knee joint 10/08/2023: Unspecified convulsions (HCC) 10/08/2023: Unspecified severe protein-calorie malnutrition (HCC) No date: Uterine fibroid 04/26/2023: UTI (urinary tract infection)     Comment:  on cipro  05/16/2015: Vaginal dryness No date: Vaginal itching No date: Vaginal Pap smear, abnormal 09/14/2023: Weakness Past Surgical History: No date: ABDOMINAL HYSTERECTOMY No date: APPENDECTOMY 03/14/2015: BIOPSY; N/A     Comment:  Procedure: BIOPSY;  Surgeon: Lamar CHRISTELLA Hollingshead, MD;                Location: [facility];  Service: Endoscopy;  Laterality: N/A;               Gastric 01/28/2017: COLONOSCOPY; N/A     Comment:  Procedure: COLONOSCOPY;  Surgeon: Hollingshead Lamar CHRISTELLA, MD;  Location: [facility];  Service: Endoscopy;                Laterality: N/A;  11:30am 03/14/2015: COLONOSCOPY WITH PROPOFOL ; N/A     Comment:  RMR: Internal hemorrhoids. colonic diverticulosis.                Incomplete examination. Prepartation inadequate. 07/04/2015: COLONOSCOPY WITH PROPOFOL ; N/A     Comment:  RMR: Colonic diverticulosis . Large polypoid lesion in               the vicinity of the hepatic flexure status post               saline-assisted piecmeal snare polypectomy  with ablation              and tattooing as described. Sigmoid polyp removed as               described above. sigmoid colon polyp hyperplastic,               hepatic flexure polyp with TA with focal high grade               dysplasia  11/03/2018: COLONOSCOPY WITH PROPOFOL ; N/A     Comment:  Procedure: COLONOSCOPY WITH PROPOFOL ;  Surgeon: Therisa Bi, MD;  Location: [facility] ENDOSCOPY;  Service:               Gastroenterology;  Laterality: N/A; 12/09/2020: COLONOSCOPY WITH PROPOFOL ; N/A     Comment:  Procedure: COLONOSCOPY WITH PROPOFOL ;  Surgeon: Therisa Bi, MD;  Location: [facility] ENDOSCOPY;  Service:               Gastroenterology;  Laterality: N/A;  COVID POSITIVE               10/30/2020 01/20/2021: COLONOSCOPY WITH PROPOFOL ; N/A     Comment:  Procedure: COLONOSCOPY WITH PROPOFOL ;  Surgeon: Therisa Bi, MD;  Location: [facility] ENDOSCOPY;  Service:               Gastroenterology;  Laterality: N/A; 12/10/2021: COLONOSCOPY WITH PROPOFOL ; N/A     Comment:  Procedure: COLONOSCOPY WITH PROPOFOL ;  Surgeon: Therisa Bi, MD;  Location: [facility] ENDOSCOPY;  Service:               Gastroenterology;  Laterality: N/A; 02/16/2023: COLONOSCOPY WITH PROPOFOL ; N/A     Comment:  Procedure: COLONOSCOPY WITH PROPOFOL ;  Surgeon: Therisa Bi, MD;  Location: [facility] ENDOSCOPY;  Service:               Gastroenterology;  Laterality: N/A; 02/12/2020: ENTEROSCOPY; N/A     Comment:  Procedure: ENTEROSCOPY;  Surgeon: Therisa Bi, MD;                Location: [facility] ENDOSCOPY;  Service: Gastroenterology;                Laterality: N/A; 03/14/2015: ESOPHAGEAL DILATION; N/A      Comment:  Procedure: ESOPHAGEAL DILATION;  Surgeon: Lamar CHRISTELLA Hollingshead, MD;  Location: [facility];  Service: Endoscopy;                Laterality: N/AMERL [address] 05/19/2016: ESOPHAGOGASTRODUODENOSCOPY; N/A     Comment:  Procedure: ESOPHAGOGASTRODUODENOSCOPY (EGD);  Surgeon:               Lamar CHRISTELLA Hollingshead, MD;  Location: [facility];  Service:               Endoscopy;  Laterality: N/A;  215 01/28/2017: ESOPHAGOGASTRODUODENOSCOPY; N/A     Comment:  Procedure: ESOPHAGOGASTRODUODENOSCOPY (EGD);  Surgeon:               Hollingshead Lamar CHRISTELLA, MD;  [facility];  Service:               Endoscopy;  Laterality: N/A; 12/10/2021: ESOPHAGOGASTRODUODENOSCOPY; N/A     Comment:  Procedure: ESOPHAGOGASTRODUODENOSCOPY (EGD);  Surgeon:               Therisa Bi, MD;  Location: [facility] ENDOSCOPY;  Service:               Gastroenterology;  Laterality: N/A; 03/14/2015: ESOPHAGOGASTRODUODENOSCOPY (EGD) WITH PROPOFOL ; N/A     Comment:  RMR: Mild erosive reflux esophagitis status post passage              o f a Maloney dilator. Abnormal gastric mucosa of               uncertain significance as described above. status post               biopsy, benign 11/18/2017: ESOPHAGOGASTRODUODENOSCOPY (EGD) WITH PROPOFOL ; N/A     Comment:  Procedure: ESOPHAGOGASTRODUODENOSCOPY (EGD) WITH               PROPOFOL ;  Surgeon: Hollingshead Lamar CHRISTELLA, MD;  [facility];  Service: Endoscopy;  Laterality: N/A;                12:15pm 11/03/2018: ESOPHAGOGASTRODUODENOSCOPY (EGD) WITH PROPOFOL ; N/A     Comment:  Procedure: ESOPHAGOGASTRODUODENOSCOPY (EGD) WITH               PROPOFOL ;  Surgeon: Therisa Bi, MD;  Location: [facility]               ENDOSCOPY;  Service: Gastroenterology;  Laterality: N/A; 02/16/2023: ESOPHAGOGASTRODUODENOSCOPY (EGD) WITH PROPOFOL ; N/A     Comment:  Procedure: ESOPHAGOGASTRODUODENOSCOPY (EGD) WITH               PROPOFOL ;  Surgeon: Therisa Bi, MD;  Location: [facility]                ENDOSCOPY;  Service: Gastroenterology;  Laterality: N/A; 12/25/2019: GIVENS CAPSULE STUDY; N/A     Comment:  Procedure: GIVENS CAPSULE STUDY;  Surgeon: Therisa Bi,               MD;  Location: [facility] ENDOSCOPY;  Service:               Gastroenterology;  Laterality: N/A; 12/16/2023: IR REMOVAL TUN ACCESS W/ PORT W/O FL MOD SED 05/19/2016: MALONEY DILATION; N/A     Comment:  Procedure: MALONEY DILATION;  Surgeon: Lamar CHRISTELLA Hollingshead,               MD;  [facility];  Service: Endoscopy;  Laterality: N/A; 01/28/2017: MALONEY DILATION; N/A     Comment:  Procedure: AGAPITO HODGKIN;  Surgeon: Shaaron Lamar HERO,               MD;  Location: [facility];  Service: Endoscopy;                Laterality: N/A; 11/18/2017: AGAPITO DILATION; N/A     Comment:  Procedure: AGAPITO HODGKIN;  Surgeon: Shaaron Lamar HERO,               MD;  [facility];  Service: Endoscopy;                Laterality: N/A; 08/30/2015: PARTIAL COLECTOMY     Comment:  polyp with adenocarcinoma 08/29/2018: PARTIAL COLECTOMY 07/04/2015: POLYPECTOMY; N/A     Comment:  Procedure: POLYPECTOMY;  Surgeon: Lamar HERO Shaaron, MD;                Location: [facility];  Service: Endoscopy;  Laterality: N/A; 09/21/2014: PORTACATH PLACEMENT; Right No date: TUBAL LIGATION BMI    Body Mass Index: 22.82 kg/m     Reproductive/Obstetrics negative OB ROS                              Anesthesia Physical Anesthesia Plan  ASA: 3  Anesthesia Plan: General   Post-op Pain Management:    Induction:   PONV Risk Score and Plan:   Airway Management Planned:   Additional Equipment:   Intra-op Plan:   Post-operative Plan:   Informed Consent: I have reviewed the patients History and Physical, chart, labs and discussed the procedure including the risks, benefits and alternatives for the proposed anesthesia with the patient or authorized representative who has indicated his/her  understanding and acceptance.     Dental Advisory Given  Plan Discussed with: CRNA  Anesthesia Plan Comments:         Anesthesia Quick Evaluation

## 2024-03-28 NOTE — Anesthesia Procedure Notes (Signed)
 Procedure Name: MAC Date/Time: 03/28/2024 8:44 AM  Performed by: Lacretia Camelia NOVAK, CRNAPre-anesthesia Checklist: Patient identified Patient Re-evaluated:Patient Re-evaluated prior to induction Oxygen Delivery Method: Nasal cannula

## 2024-03-28 NOTE — H&P (Signed)
 Ruel Kung , MD [address], [place], [place], [address] Phone: [phone] Fax: [phone]  Primary Care Physician:  Stephanie Charlene CROME, MD   Pre-Procedure History & Physical: HPI:  Julia Osborne is a 63 y.o. female is here for an colonoscopy.   Past Medical History:  Diagnosis Date   Abdominal pain, acute, left upper quadrant    Abdominal pain, epigastric    Abnormal CT scan, sigmoid colon    Abnormal vaginal Pap smear    Adenocarcinoma of colon (HCC) 09/13/2015   Partial colon resection and chemo tx's.    Ambulatory dysfunction 09/23/2023   Anemia    Anxiety    Anxiety disorder, unspecified    Aphasia    Asthma    Cancer (HCC)    endometrial; cancer cells in intestine   Chronic pain of left knee    Colon neoplasm    Constipation    Conversion reaction    COPD (chronic obstructive pulmonary disease) (HCC)    no definite diagnosis   Depression    Diarrhea, unspecified 10/08/2023   Diverticulitis    Diverticulosis of colon without hemorrhage    Dyspareunia 05/16/2015   Dysphasia    Endometrial cancer (HCC)    Esophageal dysphagia    Family history of cancer    Family history of kidney cancer    Ganglion of left knee    Gastric intestinal metaplasia    Generalized muscle weakness 09/14/2023   GERD (gastroesophageal reflux disease)    Glaucoma    Hematochezia    History of colonic polyps    History of falling 09/21/2022   Hyperlipidemia    Impaired mobility and ADLs 09/14/2023   Indigestion    Long term (current) use of antithrombotics/antiplatelets 09/21/2022   Lynch syndrome    Mucosal abnormality of stomach    Nausea and vomiting    Neuropathy    feet and hands   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 drug (HCC)    Other sequelae of cerebral infarction 10/08/2023   PONV (postoperative nausea and vomiting)     Pre-diabetes    Primary osteoarthritis of left knee    Rectal bleeding    Reflux esophagitis    Soft tissue swelling of knee joint    Unspecified convulsions (HCC) 10/08/2023   Unspecified severe protein-calorie malnutrition (HCC) 10/08/2023   Uterine fibroid    UTI (urinary tract infection) 04/26/2023   on cipro    Vaginal dryness 05/16/2015   Vaginal itching    Vaginal Pap smear, abnormal    Weakness 09/14/2023    Past Surgical History:  Procedure Laterality Date   ABDOMINAL HYSTERECTOMY     APPENDECTOMY     BIOPSY N/A 03/14/2015   Procedure: BIOPSY;  Surgeon: Lamar CHRISTELLA Hollingshead, MD;  Location: [facility];  Service: Endoscopy;  Laterality: N/A;  Gastric   COLONOSCOPY N/A 01/28/2017   Procedure: COLONOSCOPY;  Surgeon: Hollingshead Lamar CHRISTELLA, MD;  Location: [facility];  Service: Endoscopy;  Laterality: N/A;  11:30am   COLONOSCOPY WITH PROPOFOL  N/A 03/14/2015   RMR: Internal hemorrhoids. colonic diverticulosis. Incomplete examination. Prepartation inadequate.   COLONOSCOPY WITH PROPOFOL  N/A 07/04/2015   RMR: Colonic diverticulosis . Large polypoid lesion in the vicinity of the hepatic flexure status post saline-assisted piecmeal snare polypectomy  with ablation and tattooing as described. Sigmoid polyp removed as described above. sigmoid colon polyp hyperplastic, hepatic flexure polyp with TA with focal high grade dysplasia    COLONOSCOPY WITH PROPOFOL  N/A 11/03/2018   Procedure:  COLONOSCOPY WITH PROPOFOL ;  Surgeon: Therisa Bi, MD;  Location: [facility] ENDOSCOPY;  Service: Gastroenterology;  Laterality: N/A;   COLONOSCOPY WITH PROPOFOL  N/A 12/09/2020   Procedure: COLONOSCOPY WITH PROPOFOL ;  Surgeon: Therisa Bi, MD;  Location: [facility] ENDOSCOPY;  Service: Gastroenterology;  Laterality: N/A;  COVID POSITIVE 10/30/2020   COLONOSCOPY WITH PROPOFOL  N/A 01/20/2021   Procedure: COLONOSCOPY WITH PROPOFOL ;  Surgeon: Therisa Bi, MD;  Location: [facility] ENDOSCOPY;  Service: Gastroenterology;  Laterality: N/A;    COLONOSCOPY WITH PROPOFOL  N/A 12/10/2021   Procedure: COLONOSCOPY WITH PROPOFOL ;  Surgeon: Therisa Bi, MD;  Location: [facility] ENDOSCOPY;  Service: Gastroenterology;  Laterality: N/A;   COLONOSCOPY WITH PROPOFOL  N/A 02/16/2023   Procedure: COLONOSCOPY WITH PROPOFOL ;  Surgeon: Therisa Bi, MD;  Location: [facility] ENDOSCOPY;  Service: Gastroenterology;  Laterality: N/A;   ENTEROSCOPY N/A 02/12/2020   Procedure: ENTEROSCOPY;  Surgeon: Therisa Bi, MD;  Location: [facility] ENDOSCOPY;  Service: Gastroenterology;  Laterality: N/A;   ESOPHAGEAL DILATION N/A 03/14/2015   Procedure: ESOPHAGEAL DILATION;  Surgeon: Lamar CHRISTELLA Hollingshead, MD;  Location: [facility];  Service: Endoscopy;  Laterality: N/AMERL [address]   ESOPHAGOGASTRODUODENOSCOPY N/A 05/19/2016   Procedure: ESOPHAGOGASTRODUODENOSCOPY (EGD);  Surgeon: Lamar CHRISTELLA Hollingshead, MD;  [facility];  Service: Endoscopy;  Laterality: N/A;  215   ESOPHAGOGASTRODUODENOSCOPY N/A 01/28/2017   Procedure: ESOPHAGOGASTRODUODENOSCOPY (EGD);  Surgeon: Hollingshead Lamar CHRISTELLA, MD;  [facility];  Service: Endoscopy;  Laterality: N/A;   ESOPHAGOGASTRODUODENOSCOPY N/A 12/10/2021   Procedure: ESOPHAGOGASTRODUODENOSCOPY (EGD);  Surgeon: Therisa Bi, MD;  Location: [facility] ENDOSCOPY;  Service: Gastroenterology;  Laterality: N/A;   ESOPHAGOGASTRODUODENOSCOPY (EGD) WITH PROPOFOL  N/A 03/14/2015   RMR: Mild erosive reflux esophagitis status post passage o f a Maloney dilator. Abnormal gastric mucosa of uncertain significance as described above. status post biopsy, benign   ESOPHAGOGASTRODUODENOSCOPY (EGD) WITH PROPOFOL  N/A 11/18/2017   Procedure: ESOPHAGOGASTRODUODENOSCOPY (EGD) WITH PROPOFOL ;  Surgeon: Hollingshead Lamar CHRISTELLA, MD;  [facility];  Service: Endoscopy;  Laterality: N/A;  12:15pm   ESOPHAGOGASTRODUODENOSCOPY (EGD) WITH PROPOFOL  N/A 11/03/2018   Procedure: ESOPHAGOGASTRODUODENOSCOPY (EGD) WITH PROPOFOL ;  Surgeon: Therisa Bi, MD;  Location: [facility] ENDOSCOPY;  Service:  Gastroenterology;  Laterality: N/A;   ESOPHAGOGASTRODUODENOSCOPY (EGD) WITH PROPOFOL  N/A 02/16/2023   Procedure: ESOPHAGOGASTRODUODENOSCOPY (EGD) WITH PROPOFOL ;  Surgeon: Therisa Bi, MD;  Location: [facility] ENDOSCOPY;  Service: Gastroenterology;  Laterality: N/A;   GIVENS CAPSULE STUDY N/A 12/25/2019   Procedure: GIVENS CAPSULE STUDY;  Surgeon: Therisa Bi, MD;  Location: [facility] ENDOSCOPY;  Service: Gastroenterology;  Laterality: N/A;   IR REMOVAL TUN ACCESS W/ PORT W/O FL MOD SED  12/16/2023   MALONEY DILATION N/A 05/19/2016   Procedure: STAI DILATION;  Surgeon: Lamar CHRISTELLA Hollingshead, MD;  [facility];  Service: Endoscopy;  Laterality: N/A;   MALONEY DILATION N/A 01/28/2017   Procedure: STAI DILATION;  Surgeon: Hollingshead Lamar CHRISTELLA, MD;  [facility];  Service: Endoscopy;  Laterality: N/A;   MALONEY DILATION N/A 11/18/2017   Procedure: STAI DILATION;  Surgeon: Hollingshead Lamar CHRISTELLA, MD;  [facility];  Service: Endoscopy;  Laterality: N/A;   PARTIAL COLECTOMY  08/30/2015   polyp with adenocarcinoma   PARTIAL COLECTOMY  08/29/2018   POLYPECTOMY N/A 07/04/2015   Procedure: POLYPECTOMY;  Surgeon: Lamar CHRISTELLA Hollingshead, MD;  Location: [facility];  Service: Endoscopy;  Laterality: N/A;   PORTACATH PLACEMENT Right 09/21/2014   TUBAL LIGATION      Prior to Admission medications   Medication Sig Start Date End Date Taking? Authorizing Provider  ARIPiprazole  (ABILIFY ) 5 MG tablet Take 1 tablet (5 mg total) by mouth at bedtime. 11/13/23  Yes Victoria Ruts, MD  aspirin  EC 81 MG tablet Take 1 tablet (81 mg total) by mouth daily. Swallow whole. 11/14/23  Yes Victoria Ruts, MD  busPIRone  (BUSPAR ) 7.5 MG tablet Take 1 tablet (7.5 mg total) by mouth 2 (two) times daily. 11/13/23  Yes Victoria Ruts, MD  Cyanocobalamin  1000 MCG CAPS 1000 mcg DAILY (route: sublingual) 08/03/23  Yes [provider]  escitalopram  (LEXAPRO ) 20 MG tablet Take 1 tablet (20 mg total) by mouth at bedtime.  11/13/23  Yes Victoria Ruts, MD  hydrOXYzine  (ATARAX ) 25 MG tablet Take 1 tablet (25 mg total) by mouth 3 (three) times daily as needed for anxiety. 11/13/23  Yes Victoria Ruts, MD  linaclotide  (LINZESS ) 145 MCG CAPS capsule Take 1 capsule (145 mcg total) by mouth daily before breakfast. 11/14/23  Yes Victoria Ruts, MD  meclizine  (ANTIVERT ) 25 MG tablet Take 1 tablet (25 mg total) by mouth 3 (three) times daily as needed for dizziness. 11/13/23  Yes Victoria Ruts, MD  Multiple Vitamin (MULTIVITAMIN WITH MINERALS) TABS tablet Take 1 tablet by mouth daily. 11/14/23  Yes Victoria Ruts, MD  pantoprazole  (PROTONIX ) 40 MG tablet Take 1 tablet (40 mg total) by mouth daily before breakfast. 11/13/23  Yes Victoria Ruts, MD  albuterol  (VENTOLIN  HFA) 108 (90 Base) MCG/ACT inhaler 1 puff DAILY (route: inhalation) 08/04/23   [provider]  docusate sodium  (COLACE) 100 MG capsule Take 1 capsule (100 mg total) by mouth 2 (two) times daily. Patient not taking: Reported on 10/28/2023 10/22/23   Jadapalle, Sree, MD  docusate sodium  (COLACE) 100 MG capsule Take 1 capsule (100 mg total) by mouth daily as needed (mild constipation). 11/13/23   Victoria Ruts, MD  fluticasone  (FLONASE ) 50 MCG/ACT nasal spray Place 1 spray into both nostrils daily. 12/06/22 12/06/23  Cyrena Mylar, MD  gabapentin  (NEURONTIN ) 100 MG capsule Take 1 capsule (100 mg total) by mouth daily. Patient not taking: Reported on 03/28/2024 11/13/23   Victoria Ruts, MD  gabapentin  (NEURONTIN ) 100 MG capsule Take 2 capsules (200 mg total) by mouth at bedtime. Patient not taking: Reported on 03/28/2024 11/13/23   Victoria Ruts, MD  methocarbamol  (ROBAXIN ) 500 MG tablet Take 1 tablet (500 mg total) by mouth every 8 (eight) hours as needed for muscle spasms. 11/13/23   Victoria Ruts, MD  nicotine  (NICODERM CQ  - DOSED IN MG/24 HOURS) 14 mg/24hr patch Place 1 patch (14 mg total) onto the skin daily. 11/14/23   Victoria Ruts, MD   NON FORMULARY Apply 1 Application topically.    [provider]  senna-docusate (SENOKOT-S) 8.6-50 MG tablet Take 2 tablets by mouth 2 (two) times daily. 05/10/23   Victoria Ruts, MD    Allergies as of 03/14/2024 - Reviewed 12/16/2023  Allergen Reaction Noted   Codeine Nausea And Vomiting and Rash 05/02/2023    Family History  Problem Relation Age of Onset   Depression Mother    Other Mother        clot that went to heart, deceased age 75ss   Heart attack Father        age 20s, deceased   Stroke Father    Cancer Father        at death determined he was ate up with cancer   Hypertension Sister    Kidney cancer Sister 48   Diabetes Sister    Diabetes Brother    Hypertension Brother  Brain cancer Paternal Aunt    Cancer Paternal Uncle        NOS   Cancer Paternal Uncle        NOS   Cancer Paternal Uncle        NOS   Heart attack Maternal Grandfather    Endometriosis Daughter    Alcohol  abuse Son    Drug abuse Son    Colon cancer Neg Hx     Social History   Socioeconomic History   Marital status: Widowed    Spouse name: Not on file   Number of children: 2   Years of education: Not on file   Highest education level: Associate degree: occupational, Scientist, product/process development, or vocational program  Occupational History   Not on file  Tobacco Use   Smoking status: Never   Smokeless tobacco: Never  Vaping Use   Vaping status: Never Used  Substance and Sexual Activity   Alcohol  use: No   Drug use: No   Sexual activity: Not Currently    Birth control/protection: Surgical    Comment: hyst  Other Topics Concern   Not on file  Social History Narrative   ** Merged History Encounter **       ** Data from: 04/28/23 Enc Dept: [facility]-PRE ADM TESTING   #  [place]; smoking- 1/2 ppd; no alcohol ; used to run machines;   FHx-Dad- MI; ? Cancer on autopsy; sisters/aunts- brain; lung cancer x2; oldest sister- kidney cancer  Pt has 14 year old son;daughter 33  years. Brothers- states to have s   poken to her family re: importance of them being checked for lynch.        ** Data from: 05/04/23 Enc Dept: [facility]   Lives alone with 4 large dogs.    Social Drivers of Health   Financial Resource Strain: Medium Risk (12/07/2022)   Overall Financial Resource Strain (CARDIA)    Difficulty of Paying Living Expenses: Somewhat hard  Food Insecurity: Food Insecurity Present (11/03/2023)   Hunger Vital Sign    Worried About Running Out of Food in the Last Year: Sometimes true    Ran Out of Food in the Last Year: Sometimes true  Transportation Needs: Unmet Transportation Needs (11/03/2023)   PRAPARE - Transportation    Lack of Transportation (Medical): Yes    Lack of Transportation (Non-Medical): Yes  Physical Activity: Inactive (12/07/2022)   Exercise Vital Sign    Days of Exercise per Week: 0 days    Minutes of Exercise per Session: 0 min  Stress: Stress Concern Present (12/07/2022)   [employer] of Occupational Health - Occupational Stress Questionnaire    Feeling of Stress : To some extent  Social Connections: Socially Isolated (11/03/2023)   Social Connection and Isolation Panel    Frequency of Communication with Friends and Family: More than three times a week    Frequency of Social Gatherings with Friends and Family: Once a week    Attends Religious Services: Never    Database administrator or Organizations: No    Attends Banker Meetings: Never    Marital Status: Widowed  Intimate Partner Violence: Not At Risk (11/03/2023)   Humiliation, Afraid, Rape, and Kick questionnaire    Fear of Current or Ex-Partner: No    Emotionally Abused: No    Physically Abused: No    Sexually Abused: No  Recent Concern: Intimate Partner Violence - At Risk (10/16/2023)   Humiliation, Afraid, Rape, and Kick questionnaire    Fear  of Current or Ex-Partner: No    Emotionally Abused: Yes    Physically Abused: No    Sexually Abused: No     Review of Systems: See HPI, otherwise negative ROS  Physical Exam: BP 121/74   Pulse 61   Temp (!) 97.1 F (36.2 C) (Temporal)   Resp 14   Ht 5' 3 (1.6 m)   Wt 58.4 kg   SpO2 100%   BMI 22.82 kg/m  General:   Alert,  pleasant and cooperative in NAD Head:  Normocephalic and atraumatic. Neck:  Supple; no masses or thyromegaly. Lungs:  Clear throughout to auscultation, normal respiratory effort.    Heart:  +S1, +S2, Regular rate and rhythm, No edema. Abdomen:  Soft, nontender and nondistended. Normal bowel sounds, without guarding, and without rebound.   Neurologic:  Alert and  oriented x4;  grossly normal neurologically.  Impression/Plan: Julia Osborne is here for an colonoscopy to be performed for Screening colonoscopy - H/o lynch syndrome Risks, benefits, limitations, and alternatives regarding  colonoscopy have been reviewed with the patient.  Questions have been answered.  All parties agreeable.   Ruel Kung, MD  03/28/2024, 8:08 AM

## 2024-03-29 ENCOUNTER — Encounter: Payer: Self-pay | Admitting: Gastroenterology

## 2024-04-03 NOTE — Anesthesia Postprocedure Evaluation (Signed)
 Anesthesia Post Note  Patient: Julia Osborne  Procedure(s) Performed: COLONOSCOPY  Patient location during evaluation: PACU Anesthesia Type: General Level of consciousness: awake and alert Pain management: pain level controlled Vital Signs Assessment: post-procedure vital signs reviewed and stable Respiratory status: spontaneous breathing, nonlabored ventilation, respiratory function stable and patient connected to nasal cannula oxygen Cardiovascular status: blood pressure returned to baseline and stable Postop Assessment: no apparent nausea or vomiting Anesthetic complications: no   No notable events documented.   Last Vitals:  Vitals:   03/28/24 0916 03/28/24 0928  BP: 109/70 104/77  Pulse: 66 63  Resp: 11 18  Temp:    SpO2: 100% 100%    Last Pain:  Vitals:   03/28/24 0928  TempSrc:   PainSc: 0-No pain                 Lynwood KANDICE Clause

## 2024-04-05 ENCOUNTER — Inpatient Hospital Stay: Payer: Medicare HMO | Attending: Internal Medicine

## 2024-04-05 ENCOUNTER — Inpatient Hospital Stay (HOSPITAL_BASED_OUTPATIENT_CLINIC_OR_DEPARTMENT_OTHER): Payer: Medicare HMO | Admitting: Internal Medicine

## 2024-04-05 ENCOUNTER — Encounter: Payer: Self-pay | Admitting: Internal Medicine

## 2024-04-05 DIAGNOSIS — C541 Malignant neoplasm of endometrium: Secondary | ICD-10-CM

## 2024-04-05 DIAGNOSIS — R911 Solitary pulmonary nodule: Secondary | ICD-10-CM | POA: Diagnosis not present

## 2024-04-05 DIAGNOSIS — Z8542 Personal history of malignant neoplasm of other parts of uterus: Secondary | ICD-10-CM | POA: Diagnosis not present

## 2024-04-05 LAB — CBC WITH DIFFERENTIAL (CANCER CENTER ONLY)
Abs Immature Granulocytes: 0.03 K/uL (ref 0.00–0.07)
Basophils Absolute: 0 K/uL (ref 0.0–0.1)
Basophils Relative: 0 %
Eosinophils Absolute: 0.1 K/uL (ref 0.0–0.5)
Eosinophils Relative: 1 %
HCT: 38.2 % (ref 36.0–46.0)
Hemoglobin: 13 g/dL (ref 12.0–15.0)
Immature Granulocytes: 0 %
Lymphocytes Relative: 29 %
Lymphs Abs: 2.7 K/uL (ref 0.7–4.0)
MCH: 31 pg (ref 26.0–34.0)
MCHC: 34 g/dL (ref 30.0–36.0)
MCV: 91 fL (ref 80.0–100.0)
Monocytes Absolute: 0.6 K/uL (ref 0.1–1.0)
Monocytes Relative: 7 %
Neutro Abs: 5.7 K/uL (ref 1.7–7.7)
Neutrophils Relative %: 63 %
Platelet Count: 251 K/uL (ref 150–400)
RBC: 4.2 MIL/uL (ref 3.87–5.11)
RDW: 13.4 % (ref 11.5–15.5)
WBC Count: 9.3 K/uL (ref 4.0–10.5)
nRBC: 0 % (ref 0.0–0.2)

## 2024-04-05 LAB — CMP (CANCER CENTER ONLY)
ALT: 17 U/L (ref 0–44)
AST: 16 U/L (ref 15–41)
Albumin: 3.9 g/dL (ref 3.5–5.0)
Alkaline Phosphatase: 45 U/L (ref 38–126)
Anion gap: 7 (ref 5–15)
BUN: 23 mg/dL (ref 8–23)
CO2: 27 mmol/L (ref 22–32)
Calcium: 8.6 mg/dL — ABNORMAL LOW (ref 8.9–10.3)
Chloride: 102 mmol/L (ref 98–111)
Creatinine: 0.78 mg/dL (ref 0.44–1.00)
GFR, Estimated: >60 mL/min (ref >60)
Glucose, Bld: 108 mg/dL — ABNORMAL HIGH (ref 70–99)
Potassium: 3.9 mmol/L (ref 3.5–5.1)
Sodium: 136 mmol/L (ref 135–145)
Total Bilirubin: 0.5 mg/dL (ref 0.0–1.2)
Total Protein: 6.8 g/dL (ref 6.5–8.1)

## 2024-04-05 NOTE — Assessment & Plan Note (Signed)
#   Uterine Cancer endometrioid type grade 2; stage IV [lung nodules at presentation 2015/]-APRIL 30th, 2024- - CT scan abdomen pelvis/chest-negative for any obvious recurrence.   Stable. No clinical evidence of recurrence.  1. New 2 mm right upper lobe pulmonary nodule, nonspecific but favored to reflect an infectious or inflammatory etiology. Consider attention on short-term interval follow-up chest CT. 2. No evidence of metastatic disease in the abdomen or pelvis. 3. Cholelithiasis. 4. Colonic stool burden compatible with constipation. 5.  Aortic Atherosclerosis (ICD10-I70.0)  # Ortho stasis: ? Etiology- feeling woozy and several recent falls.  Mostly orthostatic- ? Etiology- awaiting cardiologist/neurology evaluation.   # Leodis syndrome ramsey of high-grade polyp/status post colectomy: EGD/colo [Dr.Anna]- 28th, may 2024; awaiting repeat  annual basis;  EGD- May 2021--reviewed.stable;  Urine cytology pending. Mammogram May 2024- WNL.   #Active smoker on Wellbutrin-discussed smoking cessation; DECLINES.   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   # Constipation- on amitizia-stable.   # IV access: port flush- q 3 M. Stable; discussed re: pro and cons of keeping the port vs. Explantation.    DISPOSITION: # Referral to IR re: mediport explantation.  # port flush every 3 months # follow up in 6 months/labs;port flush-- cbc/cmp/ca-125/urine cytology; CT CAP- Dr.B  # I reviewed the blood work- with the patient in detail; also reviewed the imaging independently [as summarized above]; and with the patient in detail.

## 2024-04-05 NOTE — Progress Notes (Signed)
 Patient had a labs done-and did not stay back for the appointment.  Patient has a 2 mm lung nodule right upper lobe-with history of endometrial cancer recommend follow-up CT scan chest in 6 months.  Discussed with Rosaline.   Follow-up in 6 months-MD labs CBC CMP; Ca1 2 5; urine cytology; CT chest prior- Dr.B

## 2024-04-06 LAB — CA 125: Cancer Antigen (CA) 125: 16.2 U/mL (ref 0.0–38.1)

## 2024-04-25 ENCOUNTER — Ambulatory Visit: Admitting: Internal Medicine

## 2024-04-28 IMAGING — CT CT CHEST LUNG CANCER SCREENING LOW DOSE W/O CM
2 of 5 series · 15 of 40 positions shown, 18 images · non-contrast
Comparison: 10/28/2020 CT chest, abdomen and pelvis. 09/20/2019
screening chest CT.

CLINICAL DATA: 60-year-old asymptomatic female current smoker with
49 pack-year smoking history. History of cervical cancer.

* Tracking Code: BO *
EXAM:
CT CHEST WITHOUT CONTRAST LOW-DOSE FOR LUNG CANCER SCREENING
TECHNIQUE: Multidetector CT imaging of the chest was performed following the
standard protocol without IV contrast.
RADIATION DOSE REDUCTION: This exam was performed according to the
departmental dose-optimization program which includes automated
exposure control, adjustment of the mA and/or kV according to
patient size and/or use of iterative reconstruction technique.

[Series 3: lung 1.00 · axial · 0.57mm/px · z∈[-1179,-894]mm · 12 of 315 slices shown, 15 images]
[im 15/315  mediastinal]
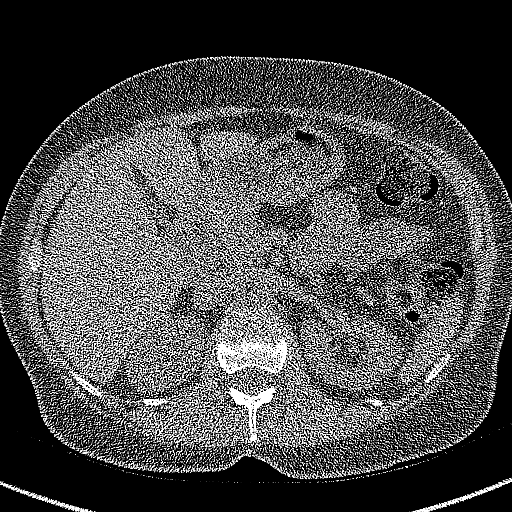
[im 15/315  lung]
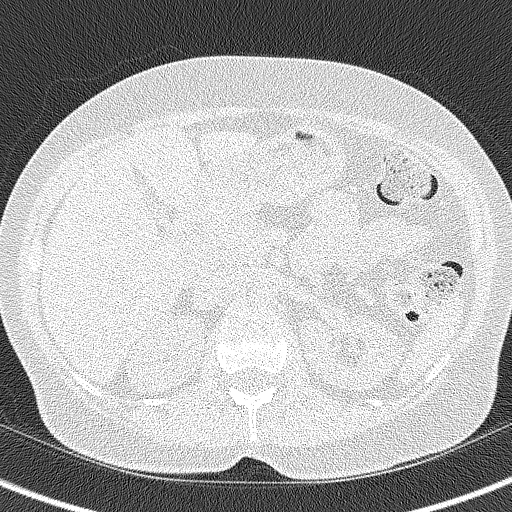
[im 43/315  lung]
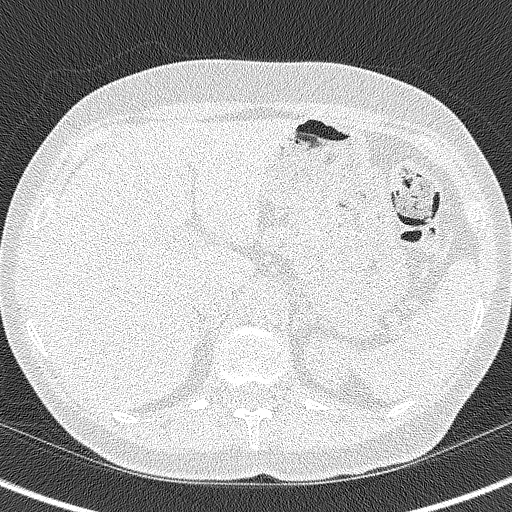
[im 72/315  lung]
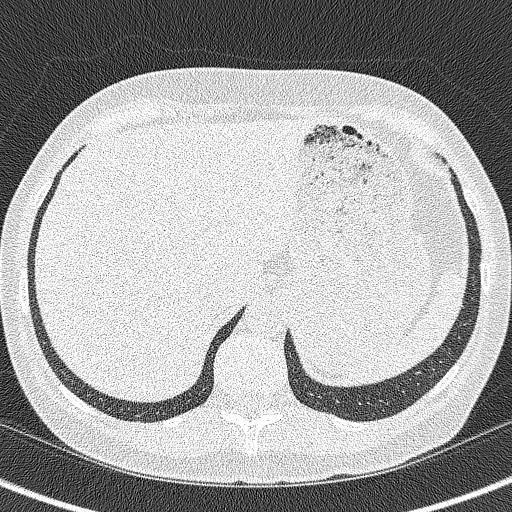
[im 100/315  lung]
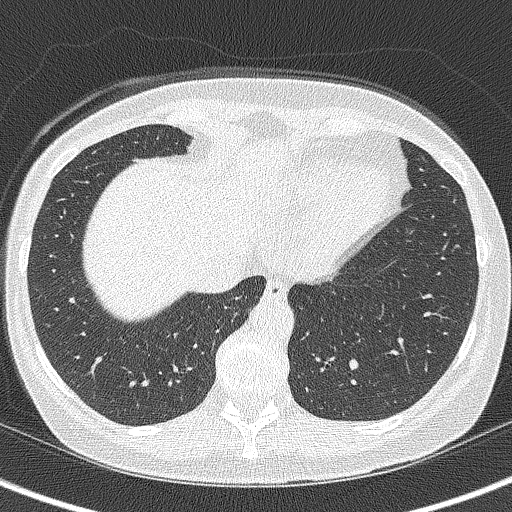
[im 115/315  mediastinal]
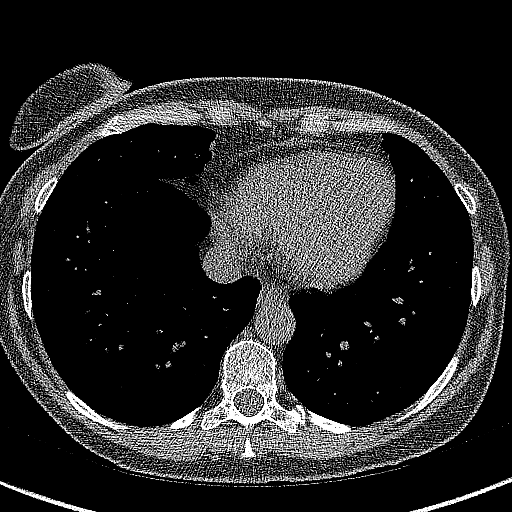
[im 115/315  lung]
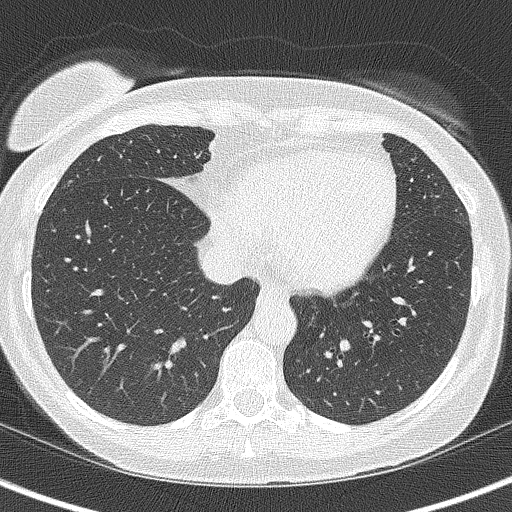
[im 143/315  lung]
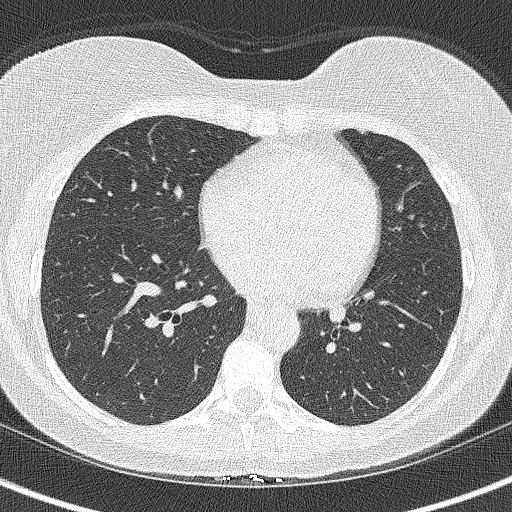
[im 172/315  lung]
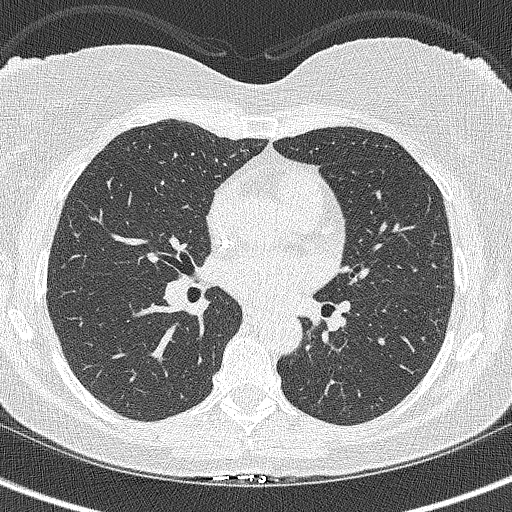
[im 200/315  lung]
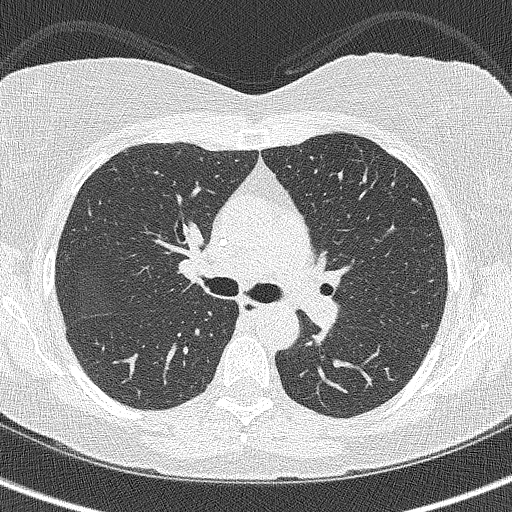
[im 215/315  mediastinal]
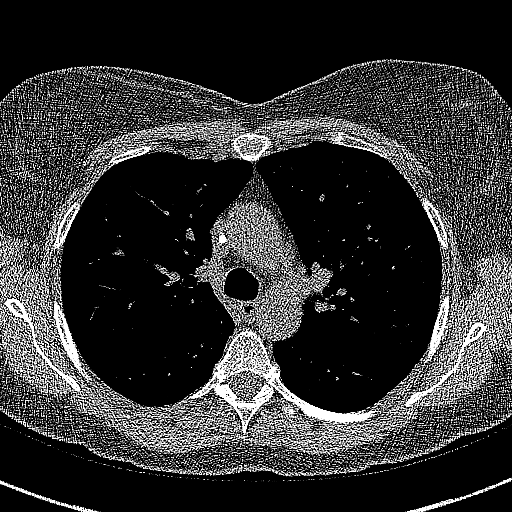
[im 215/315  lung]
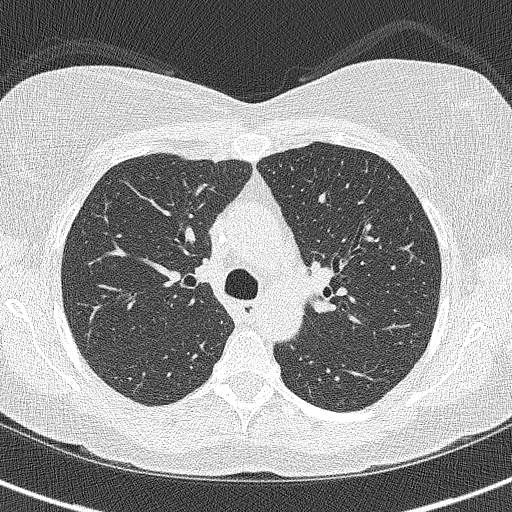
[im 243/315  lung]
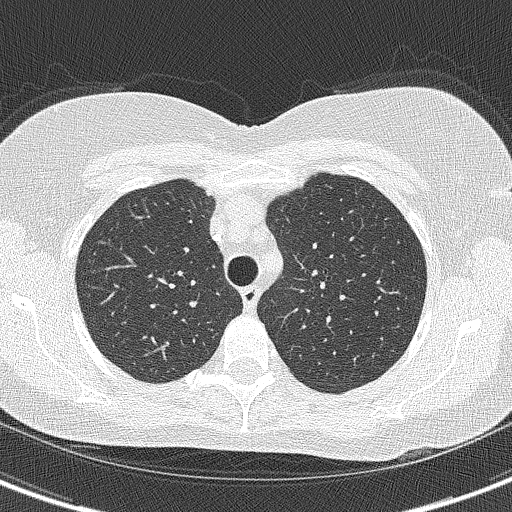
[im 272/315  lung]
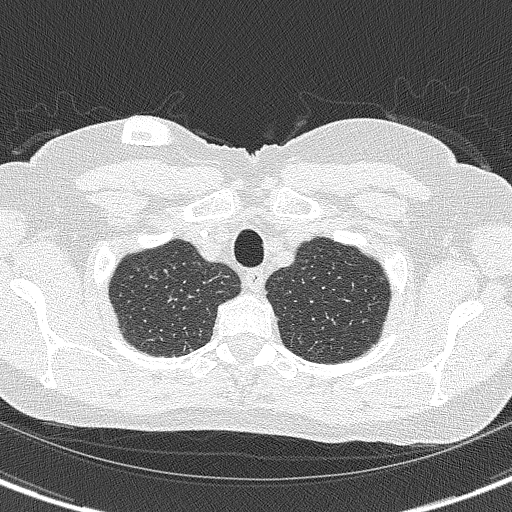
[im 300/315  lung]
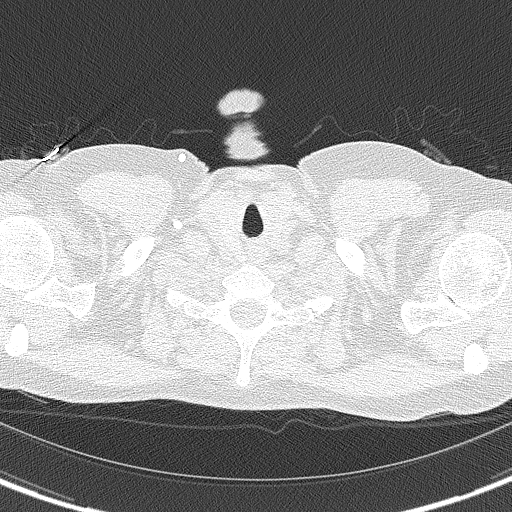

[Series 5: coronals lung 1.00 cor · coronal · 0.57mm/px · 3 of 257 slices shown]
[im 52/257  lung]
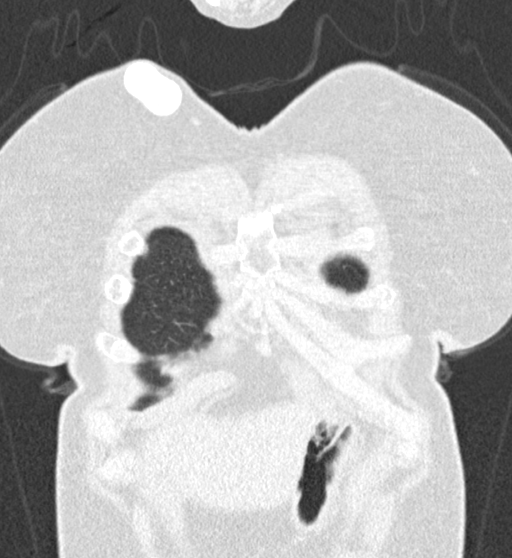
[im 103/257  lung]
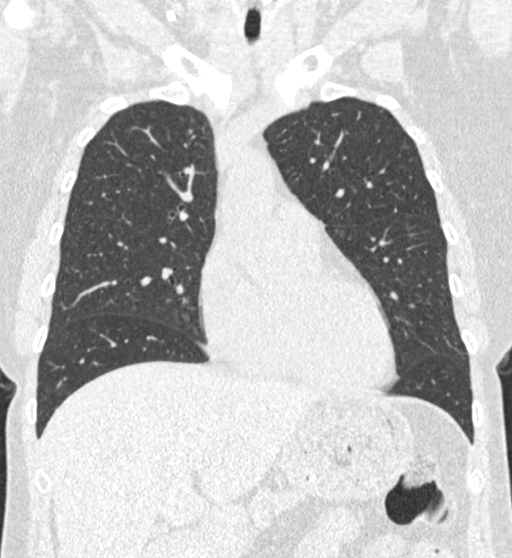
[im 154/257  lung]
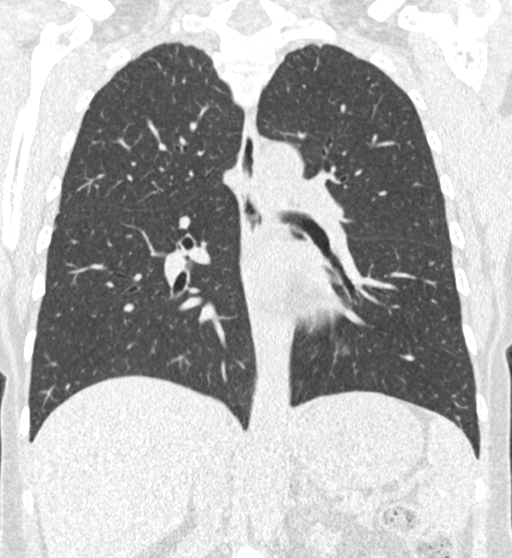

[15 of 40 positions shown; findings below may reference images not displayed]

FINDINGS: Cardiovascular: Normal heart size. No significant pericardial
effusion/thickening. Left anterior descending coronary
atherosclerosis. Right internal jugular Port-A-Cath terminates in
the lower third of the SVC. Atherosclerotic nonaneurysmal thoracic
aorta. Normal caliber pulmonary arteries.

Mediastinum/Nodes: No discrete thyroid nodules. Unremarkable
esophagus. No pathologically enlarged axillary, mediastinal or hilar
lymph nodes, noting limited sensitivity for the detection of hilar
adenopathy on this noncontrast study.

Lungs/Pleura: No pneumothorax. No pleural effusion. Mild
centrilobular emphysema. No acute consolidative airspace disease or
lung masses. No significant growth of previously visualized
pulmonary nodules. No new significant pulmonary nodules.

Upper abdomen: Cholelithiasis.

Musculoskeletal:  No aggressive appearing focal osseous lesions.
IMPRESSION: 1. Lung-RADS 2, benign appearance or behavior. Continue annual
screening with low-dose chest CT without contrast in 12 months.
2. One vessel coronary atherosclerosis.
3. Cholelithiasis.
4. Aortic Atherosclerosis (XQOK6-SYQ.Q) and Emphysema (XQOK6-PL2.C).

## 2024-06-30 DIAGNOSIS — I2510 Atherosclerotic heart disease of native coronary artery without angina pectoris: Secondary | ICD-10-CM | POA: Diagnosis not present

## 2024-06-30 DIAGNOSIS — G629 Polyneuropathy, unspecified: Secondary | ICD-10-CM | POA: Diagnosis not present

## 2024-06-30 DIAGNOSIS — F411 Generalized anxiety disorder: Secondary | ICD-10-CM | POA: Diagnosis not present

## 2024-06-30 DIAGNOSIS — Z23 Encounter for immunization: Secondary | ICD-10-CM | POA: Diagnosis not present

## 2024-06-30 DIAGNOSIS — E538 Deficiency of other specified B group vitamins: Secondary | ICD-10-CM | POA: Diagnosis not present

## 2024-06-30 DIAGNOSIS — R7303 Prediabetes: Secondary | ICD-10-CM | POA: Diagnosis not present

## 2024-06-30 DIAGNOSIS — E785 Hyperlipidemia, unspecified: Secondary | ICD-10-CM | POA: Diagnosis not present

## 2024-06-30 DIAGNOSIS — F331 Major depressive disorder, recurrent, moderate: Secondary | ICD-10-CM | POA: Diagnosis not present

## 2024-06-30 DIAGNOSIS — J439 Emphysema, unspecified: Secondary | ICD-10-CM | POA: Diagnosis not present

## 2024-06-30 DIAGNOSIS — G4700 Insomnia, unspecified: Secondary | ICD-10-CM | POA: Diagnosis not present

## 2024-07-03 ENCOUNTER — Other Ambulatory Visit: Payer: Self-pay | Admitting: Family Medicine

## 2024-07-03 DIAGNOSIS — R911 Solitary pulmonary nodule: Secondary | ICD-10-CM

## 2024-07-04 ENCOUNTER — Ambulatory Visit
Admission: RE | Admit: 2024-07-04 | Discharge: 2024-07-04 | Disposition: A | Discharge status: Home / Self Care | Source: Ambulatory Visit | Attending: Family Medicine | Admitting: Family Medicine

## 2024-07-04 DIAGNOSIS — Z85038 Personal history of other malignant neoplasm of large intestine: Secondary | ICD-10-CM | POA: Diagnosis not present

## 2024-07-04 DIAGNOSIS — I700 Atherosclerosis of aorta: Secondary | ICD-10-CM | POA: Diagnosis not present

## 2024-07-04 DIAGNOSIS — R911 Solitary pulmonary nodule: Secondary | ICD-10-CM | POA: Insufficient documentation

## 2024-07-04 DIAGNOSIS — R918 Other nonspecific abnormal finding of lung field: Secondary | ICD-10-CM | POA: Diagnosis not present

## 2024-08-02 DIAGNOSIS — H40052 Ocular hypertension, left eye: Secondary | ICD-10-CM | POA: Diagnosis not present

## 2024-08-02 DIAGNOSIS — H401121 Primary open-angle glaucoma, left eye, mild stage: Secondary | ICD-10-CM | POA: Diagnosis not present

## 2024-08-02 DIAGNOSIS — H04123 Dry eye syndrome of bilateral lacrimal glands: Secondary | ICD-10-CM | POA: Diagnosis not present

## 2024-08-02 DIAGNOSIS — H16223 Keratoconjunctivitis sicca, not specified as Sjogren's, bilateral: Secondary | ICD-10-CM | POA: Diagnosis not present

## 2024-08-02 DIAGNOSIS — H401112 Primary open-angle glaucoma, right eye, moderate stage: Secondary | ICD-10-CM | POA: Diagnosis not present

## 2024-08-04 DIAGNOSIS — H10022 Other mucopurulent conjunctivitis, left eye: Secondary | ICD-10-CM | POA: Diagnosis not present

## 2024-08-04 DIAGNOSIS — E7800 Pure hypercholesterolemia, unspecified: Secondary | ICD-10-CM | POA: Diagnosis not present

## 2024-08-06 DIAGNOSIS — Z5982 Transportation insecurity: Secondary | ICD-10-CM | POA: Diagnosis not present

## 2024-08-06 DIAGNOSIS — H61002 Unspecified perichondritis of left external ear: Secondary | ICD-10-CM | POA: Diagnosis not present

## 2024-08-06 DIAGNOSIS — Z79899 Other long term (current) drug therapy: Secondary | ICD-10-CM | POA: Diagnosis not present

## 2024-08-06 DIAGNOSIS — F4310 Post-traumatic stress disorder, unspecified: Secondary | ICD-10-CM | POA: Diagnosis not present

## 2024-08-06 DIAGNOSIS — H5704 Mydriasis: Secondary | ICD-10-CM | POA: Diagnosis not present

## 2024-08-06 DIAGNOSIS — Z7982 Long term (current) use of aspirin: Secondary | ICD-10-CM | POA: Diagnosis not present

## 2024-08-06 DIAGNOSIS — H5789 Other specified disorders of eye and adnexa: Secondary | ICD-10-CM | POA: Diagnosis not present

## 2024-08-06 DIAGNOSIS — J449 Chronic obstructive pulmonary disease, unspecified: Secondary | ICD-10-CM | POA: Diagnosis not present

## 2024-08-06 DIAGNOSIS — H538 Other visual disturbances: Secondary | ICD-10-CM | POA: Diagnosis not present

## 2024-08-06 DIAGNOSIS — H409 Unspecified glaucoma: Secondary | ICD-10-CM | POA: Diagnosis not present

## 2024-08-06 DIAGNOSIS — F17210 Nicotine dependence, cigarettes, uncomplicated: Secondary | ICD-10-CM | POA: Diagnosis not present

## 2024-08-06 DIAGNOSIS — Z885 Allergy status to narcotic agent status: Secondary | ICD-10-CM | POA: Diagnosis not present

## 2024-08-08 DIAGNOSIS — H16002 Unspecified corneal ulcer, left eye: Secondary | ICD-10-CM | POA: Diagnosis not present

## 2024-08-08 DIAGNOSIS — F17210 Nicotine dependence, cigarettes, uncomplicated: Secondary | ICD-10-CM | POA: Diagnosis not present

## 2024-08-08 DIAGNOSIS — H3589 Other specified retinal disorders: Secondary | ICD-10-CM | POA: Diagnosis not present

## 2024-08-08 DIAGNOSIS — H409 Unspecified glaucoma: Secondary | ICD-10-CM | POA: Diagnosis not present

## 2024-08-08 DIAGNOSIS — Z7982 Long term (current) use of aspirin: Secondary | ICD-10-CM | POA: Diagnosis not present

## 2024-08-08 DIAGNOSIS — H401120 Primary open-angle glaucoma, left eye, stage unspecified: Secondary | ICD-10-CM | POA: Diagnosis not present

## 2024-08-14 DIAGNOSIS — H16002 Unspecified corneal ulcer, left eye: Secondary | ICD-10-CM | POA: Diagnosis not present

## 2024-08-14 DIAGNOSIS — H40032 Anatomical narrow angle, left eye: Secondary | ICD-10-CM | POA: Diagnosis not present

## 2024-09-27 ENCOUNTER — Telehealth: Payer: Self-pay | Admitting: Internal Medicine

## 2024-09-27 ENCOUNTER — Ambulatory Visit: Admission: RE | Admit: 2024-09-27 | Source: Ambulatory Visit

## 2024-09-27 NOTE — Telephone Encounter (Signed)
 Called pt to r/s missed CT appt - left vm asking for return call - [facility]

## 2024-09-27 NOTE — Telephone Encounter (Signed)
 Pt called back to r/s missed CT - pt confirmed date/time/location - [facility]

## 2024-09-28 ENCOUNTER — Ambulatory Visit
Admission: RE | Admit: 2024-09-28 | Discharge: 2024-09-28 | Disposition: A | Discharge status: Home / Self Care | Source: Ambulatory Visit | Attending: Internal Medicine | Admitting: Internal Medicine

## 2024-09-28 DIAGNOSIS — C541 Malignant neoplasm of endometrium: Secondary | ICD-10-CM | POA: Diagnosis present

## 2024-09-28 MED ORDER — IOHEXOL 300 MG/ML  SOLN
75.0000 mL | Freq: Once | INTRAMUSCULAR | Status: AC | PRN
  Administered 2024-09-28: 75 mL via INTRAVENOUS

## 2024-10-04 ENCOUNTER — Encounter: Payer: Self-pay | Admitting: Internal Medicine

## 2024-10-04 ENCOUNTER — Other Ambulatory Visit: Payer: Self-pay | Admitting: Internal Medicine

## 2024-10-04 ENCOUNTER — Inpatient Hospital Stay: Attending: Internal Medicine

## 2024-10-04 ENCOUNTER — Inpatient Hospital Stay: Admitting: Internal Medicine

## 2024-10-04 VITALS — BP 123/86 | HR 69 | Temp 98.3°F | Resp 16 | Ht 63.0 in | Wt 146.2 lb

## 2024-10-04 DIAGNOSIS — Z8051 Family history of malignant neoplasm of kidney: Secondary | ICD-10-CM | POA: Diagnosis not present

## 2024-10-04 DIAGNOSIS — G629 Polyneuropathy, unspecified: Secondary | ICD-10-CM | POA: Diagnosis not present

## 2024-10-04 DIAGNOSIS — C541 Malignant neoplasm of endometrium: Secondary | ICD-10-CM | POA: Diagnosis not present

## 2024-10-04 DIAGNOSIS — Z79899 Other long term (current) drug therapy: Secondary | ICD-10-CM | POA: Diagnosis not present

## 2024-10-04 DIAGNOSIS — K5900 Constipation, unspecified: Secondary | ICD-10-CM | POA: Insufficient documentation

## 2024-10-04 DIAGNOSIS — Z1509 Genetic susceptibility to other malignant neoplasm: Secondary | ICD-10-CM | POA: Diagnosis not present

## 2024-10-04 DIAGNOSIS — Z90710 Acquired absence of both cervix and uterus: Secondary | ICD-10-CM | POA: Insufficient documentation

## 2024-10-04 DIAGNOSIS — Z90722 Acquired absence of ovaries, bilateral: Secondary | ICD-10-CM | POA: Insufficient documentation

## 2024-10-04 DIAGNOSIS — R911 Solitary pulmonary nodule: Secondary | ICD-10-CM | POA: Diagnosis not present

## 2024-10-04 DIAGNOSIS — Z1239 Encounter for other screening for malignant neoplasm of breast: Secondary | ICD-10-CM

## 2024-10-04 DIAGNOSIS — F17210 Nicotine dependence, cigarettes, uncomplicated: Secondary | ICD-10-CM | POA: Diagnosis not present

## 2024-10-04 DIAGNOSIS — Z9221 Personal history of antineoplastic chemotherapy: Secondary | ICD-10-CM | POA: Diagnosis not present

## 2024-10-04 DIAGNOSIS — Z808 Family history of malignant neoplasm of other organs or systems: Secondary | ICD-10-CM | POA: Diagnosis not present

## 2024-10-04 LAB — CBC WITH DIFFERENTIAL (CANCER CENTER ONLY)
Abs Immature Granulocytes: 0.03 K/uL (ref 0.00–0.07)
Basophils Absolute: 0 K/uL (ref 0.0–0.1)
Basophils Relative: 1 %
Eosinophils Absolute: 0.2 K/uL (ref 0.0–0.5)
Eosinophils Relative: 2 %
HCT: 38.8 % (ref 36.0–46.0)
Hemoglobin: 13.4 g/dL (ref 12.0–15.0)
Immature Granulocytes: 0 %
Lymphocytes Relative: 41 %
Lymphs Abs: 3.5 K/uL (ref 0.7–4.0)
MCH: 29.8 pg (ref 26.0–34.0)
MCHC: 34.5 g/dL (ref 30.0–36.0)
MCV: 86.4 fL (ref 80.0–100.0)
Monocytes Absolute: 0.6 K/uL (ref 0.1–1.0)
Monocytes Relative: 7 %
Neutro Abs: 4.3 K/uL (ref 1.7–7.7)
Neutrophils Relative %: 49 %
Platelet Count: 263 K/uL (ref 150–400)
RBC: 4.49 MIL/uL (ref 3.87–5.11)
RDW: 13 % (ref 11.5–15.5)
WBC Count: 8.6 K/uL (ref 4.0–10.5)
nRBC: 0 % (ref 0.0–0.2)

## 2024-10-04 LAB — CMP (CANCER CENTER ONLY)
ALT: 12 U/L (ref 0–44)
AST: 17 U/L (ref 15–41)
Albumin: 4.2 g/dL (ref 3.5–5.0)
Alkaline Phosphatase: 71 U/L (ref 38–126)
Anion gap: 12 (ref 5–15)
BUN: 16 mg/dL (ref 8–23)
CO2: 22 mmol/L (ref 22–32)
Calcium: 9.2 mg/dL (ref 8.9–10.3)
Chloride: 103 mmol/L (ref 98–111)
Creatinine: 0.94 mg/dL (ref 0.44–1.00)
GFR, Estimated: >60 mL/min
Glucose, Bld: 99 mg/dL (ref 70–99)
Potassium: 4 mmol/L (ref 3.5–5.1)
Sodium: 138 mmol/L (ref 135–145)
Total Bilirubin: 0.2 mg/dL (ref 0.0–1.2)
Total Protein: 7 g/dL (ref 6.5–8.1)

## 2024-10-04 NOTE — Progress Notes (Signed)
 CT chest 09/27/24.

## 2024-10-04 NOTE — Assessment & Plan Note (Addendum)
#   Uterine Cancer endometrioid type grade 2; stage IV [lung nodules at presentation 2015/]-JAN 2026-  No metastatic disease identified within the chest.Previously seen sub 4 mm ground-glass nodule in the left upper lobe is unchanged.  Previously described new 2 mm right middle lobe nodule is not seen on today's exam. No new or suspicious lung nodule. Will order imaging clinically.   # Leodis syndrome ramsey of high-grade polyp/status post colectomy: EGD/colo [Dr.Anna]-JULy 2025-; awaiting repeat  annual basis; Urine cytology pending. Mammogram May 2024- WNL.  Will order screening mammogram today  #Active smoker on Wellbutrin-discussed smoking cessation; DECLINES.   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   # Constipation- on amitizia-stable.   # IV access: PIV- s/p explantation.    DISPOSITION: # Bilateral screening mammogram  # follow up in 6 months/labs-cbc/cmp/ca-125/urine cytology-Dr.B  # I reviewed the blood work- with the patient in detail; also reviewed the imaging independently [as summarized above]; and with the patient in detail.

## 2024-10-04 NOTE — Progress Notes (Signed)
 [name] CONSULT NOTE  Patient Care Team: Hamrick, Charlene CROME, MD as PCP - General (Family Medicine) Shaaron, Lamar HERO, MD as Consulting Physician (Gastroenterology) Rennie Cindy SAUNDERS, MD as Consulting Physician (Oncology) Hamrick, Charlene CROME, MD (Family Medicine)  CHIEF COMPLAINTS/PURPOSE OF CONSULTATION:  Endometrial cancer  #  Oncology History Overview Note  # OCT 2015- Uterine ca- STAGE IV [multiple lung nodules]; Nov 2015S/p radical hysterectomy, BSO, pelvic lymph node dissection/vaginal biopsies/extensive pelvic disease including positive lymph nodes and positive distal vaginal biopsy.]; [WFB]-endometroid G-2; LVI; positive Pelvic LN/vaginal Bx Jan-April 2016- carbo-Taxol  x6 cycles Howie Salmon; Dr.Penland]  # Oct 2016- colo- high grade dysplasia [Dr.Rourk; Dr.Jenkins s/p right hemi-colectomy]; last colo- may 2018;    # Genetic testing- [MSH6 mutation]/Lynch syndrome; EGD/colo every 2-3 qyears; urin cytology q6M  Histologic grade G2, histologic type endometrioid adenocarcinoma -----------------------------------------------------  DIAGNOSIS: Endometrioid endometrial cancer  STAGE: IV  ;GOALS: Control/palliative  CURRENT/MOST RECENT THERAPY surveillance    Endometrial cancer (HCC)  07/12/2014 Imaging   CT C/A/P, pelvic adenopathy, multiple pulmonary nodules concerning for metastatic disease   07/30/2014 Initial Diagnosis   Endometrial cancer   07/31/2014 Pathology Results   endometrioid adenocarcinoma, G2, lymphovascular invasion present, positive pelvic lymph node and vaginal biopsy   07/31/2014 Definitive Surgery   radical hysterectomy, BSO, pelvic lymph node dissection and vaginal biopsies   09/25/2014 Procedure   Port-A-Cath placement in IR   10/18/2014 - 01/10/2015 Chemotherapy   Carboplatin /Taxol . First cycle given at [facility].  S/P 6 cycles total, 5 cycles given at [facility].   07/04/2015 Procedure   Colonoscopy by Dr. Shaaron   07/04/2015 Pathology  Results   Colon, polyp(s), vicinity of hepatic flexure - TUBULAR ADENOMA WITH FOCAL HIGH GRADE DYSPLASIA.    Procedure   Partial colectomy by Dr. Mavis scheduled for 08/30/2015   10/31/2015 Genetic Testing   [facility] SYNDROME MSH6 Mutation   04/27/2016 Imaging   CT CAP- No acute process or evidence of metastatic disease in the chest. Right middle lobe pulmonary nodule is unchanged back to 11/20/2014, favoring a benign etiology   11/10/2016 Imaging   CT CAP- 1. No evidence of metastatic disease in the chest, abdomen or pelvis. 2. No evidence of local tumor recurrence at the ileocolic anastomosis in the right abdomen or in the pelvis. Decreased mild fat stranding at the base of the right mesentery, most consistent with postsurgical scarring. 3. Aortic atherosclerosis.   Adenocarcinoma of colon (HCC)  07/04/2015 Pathology Results   Diagnosis 1. Colon, polyp(s), vicinity of hepatic flexure - TUBULAR ADENOMA WITH FOCAL HIGH GRADE DYSPLASIA. - NO INVASIVE CARCINOMA. 2. Colon, polyp(s), sigmoid - HYPERPLASTIC POLYP. - NO DYSPLASIA OR MALIGNANCY.   07/04/2015 Procedure   Colonoscopoy by Dr. Shaaron- large polypoid colonic lesion.   08/30/2015 Definitive Surgery   Dr. Mavis- right segmental colon resection   08/30/2015 Pathology Results   [id number] intramucosal adenocarcinoma of colon, 0.2 cm inding the laminal propria with negative resection margins and 0/17 lymph nodes.    HISTORY OF PRESENTING ILLNESS: Patient ambulating-independently.   Alone.   Julia Osborne 64 y.o.  female history of endometrial cancer stage IV/Lynch syndrome-current on surveillance is here for follow-up/review the CT scan  Discussed the use of AI scribe software for clinical note transcription with the patient, who gave verbal consent to proceed.  History of Present Illness   Julia Osborne is a 64 year old female with stage IV endometrial cancer in remission and Lynch syndrome who presents for routine lung surveillance  and review  of recent CT imaging.  She remains under surveillance following treatment for stage IV endometrial cancer. Recent CT imaging showed a stable 4 mm left upper lobe lung nodule and resolution of a previously noted right-sided pulmonary lesion. She reports no new or worsening symptoms. Her port has been removed, and she continues regular follow-up at the cancer center every few months.  She has Lynch syndrome, which confers increased risk for additional malignancies. She has not had a recent mammogram. Her last colonoscopy was performed in 2025.  She denies recent travel and spent the holidays with her grandsons. She currently smokes.       Review of Systems  Constitutional:  Positive for malaise/fatigue. Negative for chills, diaphoresis, fever and weight loss.  HENT:  Negative for sore throat.   Eyes:  Negative for double vision.  Respiratory:  Negative for cough, hemoptysis, sputum production, shortness of breath and wheezing.   Cardiovascular:  Negative for chest pain, palpitations, orthopnea and leg swelling.  Gastrointestinal:  Negative for blood in stool, constipation, diarrhea, heartburn, melena, nausea and vomiting.  Genitourinary:  Negative for dysuria, frequency and urgency.  Musculoskeletal:  Positive for back pain and joint pain.  Skin: Negative.  Negative for itching and rash.  Neurological:  Positive for tingling. Negative for dizziness, focal weakness, weakness and headaches.  Endo/Heme/Allergies:  Does not bruise/bleed easily.  Psychiatric/Behavioral:  Negative for depression. The patient is not nervous/anxious and does not have insomnia.      MEDICAL HISTORY:  Past Medical History:  Diagnosis Date   Abdominal pain, acute, left upper quadrant    Abdominal pain, epigastric    Abnormal CT scan, sigmoid colon    Abnormal vaginal Pap smear    Adenocarcinoma of colon (HCC) 09/13/2015   Partial colon resection and chemo tx's.    Ambulatory dysfunction 09/23/2023    Anemia    Anxiety    Anxiety disorder, unspecified    Aphasia    Asthma    Cancer (HCC)    endometrial; cancer cells in intestine   Chronic pain of left knee    Colon neoplasm    Constipation    Conversion reaction    COPD (chronic obstructive pulmonary disease) (HCC)    no definite diagnosis   Depression    Diarrhea, unspecified 10/08/2023   Diverticulitis    Diverticulosis of colon without hemorrhage    Dyspareunia 05/16/2015   Dysphasia    Endometrial cancer (HCC)    Esophageal dysphagia    Family history of cancer    Family history of kidney cancer    Ganglion of left knee    Gastric intestinal metaplasia    Generalized muscle weakness 09/14/2023   GERD (gastroesophageal reflux disease)    Glaucoma    Hematochezia    History of colonic polyps    History of falling 09/21/2022   Hyperlipidemia    Impaired mobility and ADLs 09/14/2023   Indigestion    Long term (current) use of antithrombotics/antiplatelets 09/21/2022   Lynch syndrome    Mucosal abnormality of stomach    Nausea and vomiting    Neuropathy    feet and hands   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 drug    Other sequelae of cerebral infarction 10/08/2023   PONV (postoperative nausea and vomiting)    Pre-diabetes    Primary osteoarthritis of left knee    Rectal bleeding    Reflux esophagitis    Soft tissue swelling of knee joint    Unspecified convulsions (HCC) 10/08/2023  Unspecified severe protein-calorie malnutrition 10/08/2023   Uterine fibroid    UTI (urinary tract infection) 04/26/2023   on cipro    Vaginal dryness 05/16/2015   Vaginal itching    Vaginal Pap smear, abnormal    Weakness 09/14/2023    SURGICAL HISTORY: Past Surgical History:  Procedure Laterality Date   ABDOMINAL HYSTERECTOMY     APPENDECTOMY     BIOPSY N/A 03/14/2015   Procedure: BIOPSY;  Surgeon: Lamar CHRISTELLA Hollingshead, MD;  Location: [facility];  Service: Endoscopy;  Laterality: N/A;  Gastric   COLONOSCOPY N/A 01/28/2017    Procedure: COLONOSCOPY;  Surgeon: Hollingshead Lamar CHRISTELLA, MD;  Location: [facility];  Service: Endoscopy;  Laterality: N/A;  11:30am   COLONOSCOPY N/A 03/28/2024   Procedure: COLONOSCOPY;  Surgeon: Therisa Bi, MD;  Location: [facility] ENDOSCOPY;  Service: Gastroenterology;  Laterality: N/A;   COLONOSCOPY WITH PROPOFOL  N/A 03/14/2015   RMR: Internal hemorrhoids. colonic diverticulosis. Incomplete examination. Prepartation inadequate.   COLONOSCOPY WITH PROPOFOL  N/A 07/04/2015   RMR: Colonic diverticulosis . Large polypoid lesion in the vicinity of the hepatic flexure status post saline-assisted piecmeal snare polypectomy  with ablation and tattooing as described. Sigmoid polyp removed as described above. sigmoid colon polyp hyperplastic, hepatic flexure polyp with TA with focal high grade dysplasia    COLONOSCOPY WITH PROPOFOL  N/A 11/03/2018   Procedure: COLONOSCOPY WITH PROPOFOL ;  Surgeon: Therisa Bi, MD;  Location: [facility] ENDOSCOPY;  Service: Gastroenterology;  Laterality: N/A;   COLONOSCOPY WITH PROPOFOL  N/A 12/09/2020   Procedure: COLONOSCOPY WITH PROPOFOL ;  Surgeon: Therisa Bi, MD;  Location: [facility] ENDOSCOPY;  Service: Gastroenterology;  Laterality: N/A;  COVID POSITIVE 10/30/2020   COLONOSCOPY WITH PROPOFOL  N/A 01/20/2021   Procedure: COLONOSCOPY WITH PROPOFOL ;  Surgeon: Therisa Bi, MD;  Location: [facility] ENDOSCOPY;  Service: Gastroenterology;  Laterality: N/A;   COLONOSCOPY WITH PROPOFOL  N/A 12/10/2021   Procedure: COLONOSCOPY WITH PROPOFOL ;  Surgeon: Therisa Bi, MD;  Location: [facility] ENDOSCOPY;  Service: Gastroenterology;  Laterality: N/A;   COLONOSCOPY WITH PROPOFOL  N/A 02/16/2023   Procedure: COLONOSCOPY WITH PROPOFOL ;  Surgeon: Therisa Bi, MD;  Location: [facility] ENDOSCOPY;  Service: Gastroenterology;  Laterality: N/A;   ENTEROSCOPY N/A 02/12/2020   Procedure: ENTEROSCOPY;  Surgeon: Therisa Bi, MD;  Location: [facility] ENDOSCOPY;  Service: Gastroenterology;  Laterality: N/A;   ESOPHAGEAL DILATION N/A  03/14/2015   Procedure: ESOPHAGEAL DILATION;  Surgeon: Lamar CHRISTELLA Hollingshead, MD;  Location: [facility];  Service: Endoscopy;  Laterality: N/AMERL [address]   ESOPHAGOGASTRODUODENOSCOPY N/A 05/19/2016   Procedure: ESOPHAGOGASTRODUODENOSCOPY (EGD);  Surgeon: Lamar CHRISTELLA Hollingshead, MD;  Location: [facility];  Service: Endoscopy;  Laterality: N/A;  215   ESOPHAGOGASTRODUODENOSCOPY N/A 01/28/2017   Procedure: ESOPHAGOGASTRODUODENOSCOPY (EGD);  Surgeon: Hollingshead Lamar CHRISTELLA, MD;  Location: [facility];  Service: Endoscopy;  Laterality: N/A;   ESOPHAGOGASTRODUODENOSCOPY N/A 12/10/2021   Procedure: ESOPHAGOGASTRODUODENOSCOPY (EGD);  Surgeon: Therisa Bi, MD;  Location: [facility] ENDOSCOPY;  Service: Gastroenterology;  Laterality: N/A;   ESOPHAGOGASTRODUODENOSCOPY (EGD) WITH PROPOFOL  N/A 03/14/2015   RMR: Mild erosive reflux esophagitis status post passage o f a Maloney dilator. Abnormal gastric mucosa of uncertain significance as described above. status post biopsy, benign   ESOPHAGOGASTRODUODENOSCOPY (EGD) WITH PROPOFOL  N/A 11/18/2017   Procedure: ESOPHAGOGASTRODUODENOSCOPY (EGD) WITH PROPOFOL ;  Surgeon: Hollingshead Lamar CHRISTELLA, MD;  [facility];  Service: Endoscopy;  Laterality: N/A;  12:15pm   ESOPHAGOGASTRODUODENOSCOPY (EGD) WITH PROPOFOL  N/A 11/03/2018   Procedure: ESOPHAGOGASTRODUODENOSCOPY (EGD) WITH PROPOFOL ;  Surgeon: Therisa Bi, MD;  Location: [facility] ENDOSCOPY;  Service: Gastroenterology;  Laterality: N/A;  ESOPHAGOGASTRODUODENOSCOPY (EGD) WITH PROPOFOL  N/A 02/16/2023   Procedure: ESOPHAGOGASTRODUODENOSCOPY (EGD) WITH PROPOFOL ;  Surgeon: Therisa Bi, MD;  Location: [facility] ENDOSCOPY;  Service: Gastroenterology;  Laterality: N/A;   GIVENS CAPSULE STUDY N/A 12/25/2019   Procedure: GIVENS CAPSULE STUDY;  Surgeon: Therisa Bi, MD;  Location: [facility] ENDOSCOPY;  Service: Gastroenterology;  Laterality: N/A;   IR REMOVAL TUN ACCESS W/ PORT W/O FL MOD SED  12/16/2023   MALONEY DILATION N/A 05/19/2016   Procedure: AGAPITO  DILATION;  Surgeon: Lamar CHRISTELLA Hollingshead, MD;  [facility];  Service: Endoscopy;  Laterality: N/A;   MALONEY DILATION N/A 01/28/2017   Procedure: AGAPITO DILATION;  Surgeon: Hollingshead Lamar CHRISTELLA, MD;  Location[facility];  Service: Endoscopy;  Laterality: N/A;   MALONEY DILATION N/A 11/18/2017   Procedure: AGAPITO DILATION;  Surgeon: Hollingshead Lamar CHRISTELLA, MD;  Location: [facility];  Service: Endoscopy;  Laterality: N/A;   PARTIAL COLECTOMY  08/30/2015   polyp with adenocarcinoma   PARTIAL COLECTOMY  08/29/2018   POLYPECTOMY N/A 07/04/2015   Procedure: POLYPECTOMY;  Surgeon: Lamar CHRISTELLA Hollingshead, MD;  Location: [facility];  Service: Endoscopy;  Laterality: N/A;   PORTACATH PLACEMENT Right 09/21/2014   TUBAL LIGATION      SOCIAL HISTORY: Social History   Socioeconomic History   Marital status: Widowed    Spouse name: Not on file   Number of children: 2   Years of education: Not on file   Highest education level: Associate degree: occupational, scientist, product/process development, or vocational program  Occupational History   Not on file  Tobacco Use   Smoking status: Never   Smokeless tobacco: Never  Vaping Use   Vaping status: Never Used  Substance and Sexual Activity   Alcohol  use: No   Drug use: No   Sexual activity: Not Currently    Birth control/protection: Surgical    Comment: hyst  Other Topics Concern   Not on file  Social History Narrative   ** Merged History Encounter **       ** Data from: 04/28/23 Enc Dept: [facility]-PRE ADM TESTING   #  [place]; smoking- 1/2 ppd; no alcohol ; used to run machines;   FHx-Dad- MI; ? Cancer on autopsy; sisters/aunts- brain; lung cancer x2; oldest sister- kidney cancer  Pt has 35 year old son;daughter 33 years. Brothers- states to have s   poken to her family re: importance of them being checked for lynch.        ** Data from: 05/04/23 Enc Dept: [facility]   Lives alone with 4 large dogs.    Social Drivers of Health   Tobacco Use: Low Risk  (10/04/2024)   Patient History    Smoking Tobacco Use: Never    Smokeless Tobacco Use: Never    Passive Exposure: Not on file  Recent Concern: Tobacco Use - High Risk (08/14/2024)   Received [facility]   Patient History    Smoking Tobacco Use: Every Day    Smokeless Tobacco Use: Unknown    Passive Exposure: Not on file  Financial Resource Strain: Medium Risk (12/07/2022)   Overall Financial Resource Strain (CARDIA)    Difficulty of Paying Living Expenses: Somewhat hard  Food Insecurity: Food Insecurity Present (11/03/2023)   Hunger Vital Sign    Worried About Running Out of Food in the Last Year: Sometimes true    Ran Out of Food in the Last Year: Sometimes true  Transportation Needs: Unmet Transportation Needs (11/03/2023)   PRAPARE - Transportation  Lack of Transportation (Medical): Yes    Lack of Transportation (Non-Medical): Yes  Physical Activity: Inactive (12/07/2022)   Exercise Vital Sign    Days of Exercise per Week: 0 days    Minutes of Exercise per Session: 0 min  Stress: Stress Concern Present (12/07/2022)   [employer] of Occupational Health - Occupational Stress Questionnaire    Feeling of Stress : To some extent  Social Connections: Socially Isolated (11/03/2023)   Social Connection and Isolation Panel    Frequency of Communication with Friends and Family: More than three times a week    Frequency of Social Gatherings with Friends and Family: Once a week    Attends Religious Services: Never    Database Administrator or Organizations: No    Attends Banker Meetings: Never    Marital Status: Widowed  Intimate Partner Violence: Not At Risk (08/08/2024)   Received from [facility]   Epic    Within the last year, have you been afraid of your partner or ex-partner?: No    Within the last year, have you been humiliated or emotionally abused in other ways by your partner or ex-partner?: No    Within the last year, have you been kicked, hit,  slapped, or otherwise physically hurt by your partner or ex-partner?: No    Within the last year, have you been raped or forced to have any kind of sexual activity by your partner or ex-partner?: No  Depression (PHQ2-9): Low Risk (10/04/2024)   Depression (PHQ2-9)    PHQ-2 Score: 1  Alcohol  Screen: Low Risk (11/03/2023)   Alcohol  Screen    Last Alcohol  Screening Score (AUDIT): 1  Housing: Unknown (03/14/2024)   Received from [facility]   Epic    Unable to Pay for Housing in the Last Year: Not on file    Number of Times Moved in the Last Year: Not on file    At any time in the past 12 months, were you homeless or living in a shelter (including now)?: No  Utilities: At Risk (11/03/2023)   [facility] Utilities    Threatened with loss of utilities: Yes  Health Literacy: Not on file    FAMILY HISTORY:  Family History  Problem Relation Age of Onset   Other Mother        clot that went to heart, deceased age 61ss   Heart attack Father        age 69s, deceased   Stroke Father    Cancer Father        at death determined he was ate up with cancer   Hypertension Sister    Kidney cancer Sister 59   Diabetes Brother    Hypertension Brother    Endometriosis Daughter    Heart attack Maternal Grandfather    Diabetes Sister    Brain cancer Paternal Aunt    Cancer Paternal Uncle        NOS   Cancer Paternal Uncle        NOS   Cancer Paternal Uncle        NOS   Colon cancer Neg Hx     ALLERGIES:  is allergic to codeine.  MEDICATIONS:  Current Outpatient Medications  Medication Sig Dispense Refill   albuterol  (VENTOLIN  HFA) 108 (90 Base) MCG/ACT inhaler 1 puff DAILY (route: inhalation)     ARIPiprazole  (ABILIFY ) 5 MG tablet Take 1 tablet (5 mg total) by mouth at bedtime. 30 tablet 0  aspirin  EC 81 MG tablet Take 1 tablet (81 mg total) by mouth daily. Swallow whole. 30 tablet 12   busPIRone  (BUSPAR ) 7.5 MG tablet Take 1 tablet (7.5 mg total) by mouth 2 (two) times daily.  60 tablet 0   Cyanocobalamin  1000 MCG CAPS 1000 mcg DAILY (route: sublingual)     docusate sodium  (COLACE) 100 MG capsule Take 1 capsule (100 mg total) by mouth daily as needed (mild constipation). 10 capsule 0   escitalopram  (LEXAPRO ) 20 MG tablet Take 1 tablet (20 mg total) by mouth at bedtime. 30 tablet 0   fluticasone  (FLONASE ) 50 MCG/ACT nasal spray Place 1 spray into both nostrils daily. 1 g 0   gabapentin  (NEURONTIN ) 100 MG capsule Take 1 capsule (100 mg total) by mouth daily. 30 capsule 0   hydrOXYzine  (ATARAX ) 25 MG tablet Take 1 tablet (25 mg total) by mouth 3 (three) times daily as needed for anxiety. 30 tablet 0   linaclotide  (LINZESS ) 145 MCG CAPS capsule Take 1 capsule (145 mcg total) by mouth daily before breakfast. 30 capsule 0   meclizine  (ANTIVERT ) 25 MG tablet Take 1 tablet (25 mg total) by mouth 3 (three) times daily as needed for dizziness. 30 tablet 0   methocarbamol  (ROBAXIN ) 500 MG tablet Take 1 tablet (500 mg total) by mouth every 8 (eight) hours as needed for muscle spasms. 30 tablet 0   Multiple Vitamin (MULTIVITAMIN WITH MINERALS) TABS tablet Take 1 tablet by mouth daily. 30 tablet 0   nicotine  (NICODERM CQ  - DOSED IN MG/24 HOURS) 14 mg/24hr patch Place 1 patch (14 mg total) onto the skin daily. 28 patch 0   pantoprazole  (PROTONIX ) 40 MG tablet Take 1 tablet (40 mg total) by mouth daily before breakfast. 15 tablet 0   senna-docusate (SENOKOT-S) 8.6-50 MG tablet Take 2 tablets by mouth 2 (two) times daily. 30 tablet 0   No current facility-administered medications for this visit.      SABRA  PHYSICAL EXAMINATION: ECOG PERFORMANCE STATUS: 0 - Asymptomatic  Vitals:   10/04/24 1511  BP: 123/86  Pulse: 69  Resp: 16  Temp: 98.3 F (36.8 C)  SpO2: 98%    Filed Weights   10/04/24 1511  Weight: 146 lb 3.2 oz (66.3 kg)     Physical Exam Constitutional:      Comments: Patient is alone.  HENT:     Head: Normocephalic and atraumatic.     Mouth/Throat:      Pharynx: No oropharyngeal exudate.  Eyes:     Pupils: Pupils are equal, round, and reactive to light.  Cardiovascular:     Rate and Rhythm: Normal rate and regular rhythm.  Pulmonary:     Effort: No respiratory distress.     Breath sounds: No wheezing.  Abdominal:     General: Bowel sounds are normal. There is no distension.     Palpations: Abdomen is soft. There is no mass.     Tenderness: There is no abdominal tenderness. There is no guarding or rebound.  Musculoskeletal:        General: No tenderness. Normal range of motion.     Cervical back: Normal range of motion and neck supple.  Skin:    General: Skin is warm.     Comments:    Neurological:     Mental Status: She is alert and oriented to person, place, and time.  Psychiatric:        Mood and Affect: Affect normal.      LABORATORY DATA:  I have reviewed the data as listed Lab Results  Component Value Date   WBC 8.6 10/04/2024   HGB 13.4 10/04/2024   HCT 38.8 10/04/2024   MCV 86.4 10/04/2024   PLT 263 10/04/2024   Recent Labs    11/03/23 1316 04/05/24 1332 10/04/24 1458  NA 142 136 138  K 3.8 3.9 4.0  CL 105 102 103  CO2 27 27 22   GLUCOSE 94 108* 99  BUN 12 23 16   CREATININE 0.72 0.78 0.94  CALCIUM  9.3 8.6* 9.2  GFRNONAA >60 >60 >60  PROT 7.3 6.8 7.0  ALBUMIN 4.5 3.9 4.2  AST 19 16 17   ALT 23 17 12   ALKPHOS 47 45 71  BILITOT 1.0 0.5 0.2    RADIOGRAPHIC STUDIES: I have personally reviewed the radiological images as listed and agreed with the findings in the report. CT CHEST W CONTRAST Result Date: 09/28/2024 CLINICAL DATA:  lung nodule; Hx of endometrial cancer. * Tracking Code: BO * EXAM: CT CHEST WITH CONTRAST TECHNIQUE: Multidetector CT imaging of the chest was performed during intravenous contrast administration. RADIATION DOSE REDUCTION: This exam was performed according to the departmental dose-optimization program which includes automated exposure control, adjustment of the mA and/or kV  according to patient size and/or use of iterative reconstruction technique. CONTRAST:  75mL OMNIPAQUE  IOHEXOL  300 MG/ML  SOLN COMPARISON:  CT scan chest from 07/04/2024. FINDINGS: Cardiovascular: Normal cardiac size. No pericardial effusion. No aortic aneurysm. Mediastinum/Nodes: Visualized thyroid  gland appears grossly unremarkable. No solid / cystic mediastinal masses. The esophagus is nondistended precluding optimal assessment. No axillary, mediastinal or hilar lymphadenopathy by size criteria. Lungs/Pleura: The central tracheo-bronchial tree is patent. Previously seen faint ground-glass, sub 4 mm nodule in the left upper lobe (series 3, image 34), is unchanged. Previously described New 2 mm right middle lobe  Pulmonary nodule is not seen on today's exam. No new or suspicious lung nodule no mass or consolidation. No pleural effusion or pneumothorax. Upper Abdomen: Visualized upper abdominal viscera within normal limits. Musculoskeletal: The visualized soft tissues of the chest wall are grossly unremarkable. No suspicious osseous lesions. IMPRESSION: 1. No metastatic disease identified within the chest. 2. Previously seen sub 4 mm ground-glass nodule in the left upper lobe is unchanged. 3. Previously described new 2 mm right middle lobe nodule is not seen on today's exam. No new or suspicious lung nodule. 4. Otherwise essentially unremarkable exam, as described above. Electronically Signed   By: Ree Molt M.D.   On: 09/28/2024 15:26    ASSESSMENT & PLAN:   Endometrial cancer (HCC) # Uterine Cancer endometrioid type grade 2; stage IV [lung nodules at presentation 2015/]-JAN 2026-  No metastatic disease identified within the chest.Previously seen sub 4 mm ground-glass nodule in the left upper lobe is unchanged.  Previously described new 2 mm right middle lobe nodule is not seen on today's exam. No new or suspicious lung nodule. Will order imaging clinically.   # Leodis syndrome ramsey of high-grade  polyp/status post colectomy: EGD/colo [Dr.Anna]-JULy 2025-; awaiting repeat  annual basis; Urine cytology pending. Mammogram May 2024- WNL.  Will order screening mammogram today  #Active smoker on Wellbutrin-discussed smoking cessation; DECLINES.   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   # Constipation- on amitizia-stable.   # IV access: PIV- s/p explantation.    DISPOSITION: # Bilateral screening mammogram  # follow up in 6 months/labs-cbc/cmp/ca-125/urine cytology-Dr.B  # I reviewed the blood work- with the patient in detail; also reviewed the imaging  independently [as summarized above]; and with the patient in detail.     Cindy JONELLE Joe, MD 10/04/2024 3:49 PM

## 2024-10-05 LAB — CA 125: Cancer Antigen (CA) 125: 15.4 U/mL (ref 0.0–38.1)

## 2024-10-05 LAB — CYTOLOGY - NON PAP

## 2024-10-30 ENCOUNTER — Encounter

## 2025-04-04 ENCOUNTER — Inpatient Hospital Stay: Admitting: Internal Medicine

## 2025-04-04 ENCOUNTER — Inpatient Hospital Stay
